# Patient Record
Sex: Male | Born: 1965 | Race: White | Hispanic: No | Marital: Married | Smoking: Former smoker
Health system: Southern US, Community
[De-identification: ages and names within clinical notes are randomized; demographics above are authoritative.]

## PROBLEM LIST (undated history)

## (undated) DIAGNOSIS — K219 Gastro-esophageal reflux disease without esophagitis: Secondary | ICD-10-CM

## (undated) DIAGNOSIS — F329 Major depressive disorder, single episode, unspecified: Secondary | ICD-10-CM

## (undated) DIAGNOSIS — G43909 Migraine, unspecified, not intractable, without status migrainosus: Secondary | ICD-10-CM

## (undated) DIAGNOSIS — I509 Heart failure, unspecified: Secondary | ICD-10-CM

## (undated) DIAGNOSIS — I219 Acute myocardial infarction, unspecified: Secondary | ICD-10-CM

## (undated) DIAGNOSIS — R0600 Dyspnea, unspecified: Secondary | ICD-10-CM

## (undated) DIAGNOSIS — Z95810 Presence of automatic (implantable) cardiac defibrillator: Secondary | ICD-10-CM

## (undated) DIAGNOSIS — Q2381 Bicuspid aortic valve: Secondary | ICD-10-CM

## (undated) DIAGNOSIS — F32A Depression, unspecified: Secondary | ICD-10-CM

## (undated) DIAGNOSIS — I255 Ischemic cardiomyopathy: Secondary | ICD-10-CM

## (undated) DIAGNOSIS — F419 Anxiety disorder, unspecified: Secondary | ICD-10-CM

## (undated) DIAGNOSIS — E785 Hyperlipidemia, unspecified: Secondary | ICD-10-CM

## (undated) DIAGNOSIS — I350 Nonrheumatic aortic (valve) stenosis: Secondary | ICD-10-CM

## (undated) DIAGNOSIS — Q230 Congenital stenosis of aortic valve: Secondary | ICD-10-CM

## (undated) HISTORY — DX: Anxiety disorder, unspecified: F41.9

## (undated) HISTORY — DX: Gastro-esophageal reflux disease without esophagitis: K21.9

## (undated) HISTORY — PX: WISDOM TOOTH EXTRACTION: SHX21

## (undated) HISTORY — DX: Ischemic cardiomyopathy: I25.5

## (undated) HISTORY — DX: Congenital stenosis of aortic valve: Q23.0

## (undated) HISTORY — DX: Bicuspid aortic valve: Q23.81

## (undated) HISTORY — DX: Nonrheumatic aortic (valve) stenosis: I35.0

## (undated) HISTORY — DX: Hyperlipidemia, unspecified: E78.5

## (undated) NOTE — *Deleted (*Deleted)
***In Progress*** Referring Physician: HF Cardiology: Dr. Shirlee Latch  HPI:  70 y.o.male smoker wasadmitted on 8/1/21for delayed presentation anterior STEMI. Had had >36 hr of chest pain prior to seeking medical attention. Pain progressed to more pleuritic like CP. Found to have anterior ST elevations w/ precordial Q waves on admit. Hs troponin >27,000. Urgent cardiac cath showed totally occluded mid LAD w/o collaterals and 40% mid RCA stenosis. It was suspected he had completed his infarct and residual pleuritic CP was post-MI pericarditis. No intervention was performed. 2D echo demonstrated moderately reduced LVEF, 45-50%, + apical thrombus and moderate AS. MV normal. RV systolic function mildly reduced.   He was placed on medical management for CAD and systolic HF. He was started on ASA, atorvastatin, Losartan and carvedilol. Warfarin started for apical thrombusw/ heparin bridge.   Post cath, there were initial concerns for developing cardiogenic shock but he remained stable and did not require pressor/inotropic support. Co-ox remained stable. Repeat echo showed further reduction of LVEF down to 30-35% but no post MI mechanical complications. RV mildly reduced. He did require IV furosemide for pulmonary edema and volume status and dyspnea improved w/ diuresis. He was continued on GDMT w/ losartan, spironolactone and carvedilol. BP too soft for Entresto. He was treated w/ colchicine for post MI pericarditis w/ improvement in pleuritic chest pain. Given anterior MI w/ EF <35%, he was fitted w/ a LifeVest prior to discharge w/ plans to get repeat echo in 1 month.  He had repeat echo in 9/21, showing EF 25-30%. He saw Dr. Graciela Husbands with plan for ICD 11/28/19. He is still wearing Lifevest.   He recently returned to HF Clinic for followup of CHF and CAD with Dr. Shirlee Latch on 11/17/19. He seemed to be doing generally well. Occasional sensation of fullness in his chest but no pain. Using furosemide prn, but  still takes 4-5 times/week. No lightheadedness. He reported walking for 30-45 minutes with no problems on flat ground. He reported getting short of breath walking up stairs or up a hill. No orthopnea/PND. Still smoking <1 ppd. He is an Chartered loss adjuster, working on a novel.     He returned to HF clinic on 12/05/19 for pharmacist medication titration. At last visit with MD, losartan was discontinued and Entresto 24/26 mg BID was initiated. Overall he was feeling well. Had occasional dizziness which was not bothersome and not worse since changing to [facility]. No chest pain or palpitations. Could walk for 40-60 min before getting SOB, notably more SOB going up stairs. Took furosemide PRN approximately 3-4 times per week. No LEE, PND or orthopnea. Was taking all medications as prescribed and tolerating all medications.   Today he returns to HF clinic for pharmacist medication titration. At last visit with MD, spironolactone was increased to 25 mg daily.  104/64, 86, 177.8 lbs [place] [employer] - Needs BMET Plan A: Farxiga Later: incr coreg but recently cardiogenic shock, incr Entresto but BP too low F/u DM on 1/3  Overall feeling ***. Dizziness, lightheadedness, fatigue:  Chest pain or palpitations:  How is your breathing?: *** SOB: Able to complete all ADLs. Activity level ***  Weight at home pounds. Takes furosemide/torsemide/bumex *** mg *** daily.  LEE PND/Orthopnea  Appetite *** Low-salt diet:   Physical Exam Cost/affordability of meds   HF Medications: Carvedilol 3.125 mg BID Entresto 24/26 mg BID Spironolactone 25 mg daily Digoxin 0.125 mg daily Furosemide 20 mg PRN  Has the patient been experiencing any side effects to the medications prescribed?  {YES ZO:10960}  Does the patient have any problems obtaining medications due to transportation or finances?   {YES NO:22349}  Understanding of regimen: {excellent/good/fair/poor:19665} Understanding of indications:  {excellent/good/fair/poor:19665} Potential of compliance: {excellent/good/fair/poor:19665} Patient understands to avoid NSAIDs. Patient understands to avoid decongestants.    Pertinent Lab Values: . Serum creatinine ***, BUN ***, Potassium ***, Sodium ***, BNP ***, Magnesium ***, Digoxin ***   Vital Signs: . Weight: *** (last clinic weight: 177.8 lbs) . Blood pressure: ***  . Heart rate: ***   Assessment: 1. ZOX:[id number]/11 for acute anterior MI, delayed presentation with no ischemic pain, had pleuritic-type chest pain (post-MI pericarditis). LHC with totally occluded mid LAD w/o collaterals. Suspected completed MI, no intervention. Plan medical management.No ischemic chest pain. - Continue ASA 81 mg daily. Not on clopidogrel because on dual warfarin/aspirin.  - Continue atorvastatin 80 mg daily -Refer to cardiac rehab.  2.Chronic systolic BJY:NWGNFAOZ Cardiomyopathy. Reduced EF post anterior MI. Echo in 9/21with EF 25-30%, peri-apical akinesis, moderate AS. Cath w/ occluded mLAD but no intervention as he had completed his infarct. - Currently NYHA class II, not volume overloaded on exam.  - Received [employer] ICD on 11/28/19 - Continue furosemide 20 mg PRN - Continue carvedilol 3.125 mg BID. - Continue Entresto 24/26 mg BID.   - Continuespironolactone 25 mg daily - Continue digoxin 0.125 mg daily.Last level 0.7 ng/mL on 11/17/19.   3. Post MI Pericarditis: Pleuritic CP resolved.  -ContinueColchicine 0.6 mg dailyx 3 months. Sent in prescription for brand Mitigare today as this is preferred on Medicaid's formulary.   4. LV thrombus: Large LV thrombus noted on echoin 8/21, resolved on 9/21 echo. -Continue warfarin.  5. H/o Tobacco Abuse:Still smoking, needs to quit.  6. HYQ:MVHQIONG atorvastatin    Plan: 1) Medication changes: Based on clinical presentation, vital signs and recent labs will *** 2) Labs: *** 3) Follow-up: ***   Karle Plumber, PharmD, BCPS, BCCP, CPP Heart Failure Clinic Pharmacist [phone]

---

## 2013-12-07 ENCOUNTER — Ambulatory Visit: Payer: Self-pay

## 2014-04-26 ENCOUNTER — Emergency Department (HOSPITAL_COMMUNITY)
Admission: EM | Admit: 2014-04-26 | Discharge: 2014-04-26 | Disposition: A | Discharge status: Other Acute Inpatient Hospital | Payer: Self-pay | Attending: Emergency Medicine | Admitting: Emergency Medicine

## 2014-04-26 ENCOUNTER — Encounter (HOSPITAL_COMMUNITY): Payer: Self-pay | Admitting: Emergency Medicine

## 2014-04-26 ENCOUNTER — Inpatient Hospital Stay (HOSPITAL_COMMUNITY)
Admission: AD | Admit: 2014-04-26 | Discharge: 2014-05-02 | DRG: 885 | Disposition: A | Discharge status: Home / Self Care | Payer: Federal, State, Local not specified - Other | Source: Intra-hospital | Attending: Psychiatry | Admitting: Psychiatry

## 2014-04-26 ENCOUNTER — Encounter (HOSPITAL_COMMUNITY): Payer: Self-pay | Admitting: *Deleted

## 2014-04-26 DIAGNOSIS — K219 Gastro-esophageal reflux disease without esophagitis: Secondary | ICD-10-CM | POA: Diagnosis present

## 2014-04-26 DIAGNOSIS — F10239 Alcohol dependence with withdrawal, unspecified: Secondary | ICD-10-CM | POA: Diagnosis present

## 2014-04-26 DIAGNOSIS — F10230 Alcohol dependence with withdrawal, uncomplicated: Secondary | ICD-10-CM | POA: Diagnosis present

## 2014-04-26 DIAGNOSIS — Z79899 Other long term (current) drug therapy: Secondary | ICD-10-CM | POA: Insufficient documentation

## 2014-04-26 DIAGNOSIS — F1010 Alcohol abuse, uncomplicated: Secondary | ICD-10-CM | POA: Diagnosis present

## 2014-04-26 DIAGNOSIS — R45851 Suicidal ideations: Secondary | ICD-10-CM

## 2014-04-26 DIAGNOSIS — F332 Major depressive disorder, recurrent severe without psychotic features: Secondary | ICD-10-CM | POA: Diagnosis present

## 2014-04-26 DIAGNOSIS — F411 Generalized anxiety disorder: Secondary | ICD-10-CM | POA: Diagnosis present

## 2014-04-26 DIAGNOSIS — F17210 Nicotine dependence, cigarettes, uncomplicated: Secondary | ICD-10-CM | POA: Diagnosis present

## 2014-04-26 DIAGNOSIS — G4700 Insomnia, unspecified: Secondary | ICD-10-CM | POA: Diagnosis present

## 2014-04-26 DIAGNOSIS — G43909 Migraine, unspecified, not intractable, without status migrainosus: Secondary | ICD-10-CM | POA: Insufficient documentation

## 2014-04-26 DIAGNOSIS — F1994 Other psychoactive substance use, unspecified with psychoactive substance-induced mood disorder: Secondary | ICD-10-CM | POA: Diagnosis present

## 2014-04-26 DIAGNOSIS — F322 Major depressive disorder, single episode, severe without psychotic features: Secondary | ICD-10-CM

## 2014-04-26 DIAGNOSIS — Z720 Tobacco use: Secondary | ICD-10-CM | POA: Insufficient documentation

## 2014-04-26 DIAGNOSIS — Q681 Congenital deformity of finger(s) and hand: Secondary | ICD-10-CM | POA: Insufficient documentation

## 2014-04-26 DIAGNOSIS — Y900 Blood alcohol level of less than 20 mg/100 ml: Secondary | ICD-10-CM | POA: Insufficient documentation

## 2014-04-26 HISTORY — DX: Gastro-esophageal reflux disease without esophagitis: K21.9

## 2014-04-26 HISTORY — DX: Migraine, unspecified, not intractable, without status migrainosus: G43.909

## 2014-04-26 HISTORY — DX: Depression, unspecified: F32.A

## 2014-04-26 HISTORY — DX: Major depressive disorder, single episode, unspecified: F32.9

## 2014-04-26 LAB — COMPREHENSIVE METABOLIC PANEL
ALK PHOS: 66 U/L (ref 39–117)
ALT: 23 U/L (ref 0–53)
AST: 23 U/L (ref 0–37)
Albumin: 4.7 g/dL (ref 3.5–5.2)
Anion gap: 11 (ref 5–15)
BUN: 12 mg/dL (ref 6–23)
CALCIUM: 9.3 mg/dL (ref 8.4–10.5)
CHLORIDE: 103 mmol/L (ref 96–112)
CO2: 23 mmol/L (ref 19–32)
CREATININE: 0.85 mg/dL (ref 0.50–1.35)
GFR calc Af Amer: >90 mL/min (ref >90)
GFR calc non Af Amer: >90 mL/min (ref >90)
Glucose, Bld: 103 mg/dL — ABNORMAL HIGH (ref 70–99)
POTASSIUM: 3.8 mmol/L (ref 3.5–5.1)
Sodium: 137 mmol/L (ref 135–145)
Total Bilirubin: 0.9 mg/dL (ref 0.3–1.2)
Total Protein: 7.7 g/dL (ref 6.0–8.3)

## 2014-04-26 LAB — CBC
HEMATOCRIT: 45.1 % (ref 39.0–52.0)
Hemoglobin: 15.6 g/dL (ref 13.0–17.0)
MCH: 31.2 pg (ref 26.0–34.0)
MCHC: 34.6 g/dL (ref 30.0–36.0)
MCV: 90.2 fL (ref 78.0–100.0)
Platelets: 307 10*3/uL (ref 150–400)
RBC: 5 MIL/uL (ref 4.22–5.81)
RDW: 12.7 % (ref 11.5–15.5)
WBC: 11.1 10*3/uL — ABNORMAL HIGH (ref 4.0–10.5)

## 2014-04-26 LAB — ETHANOL: Alcohol, Ethyl (B): <5 mg/dL (ref 0–9)

## 2014-04-26 LAB — SALICYLATE LEVEL: Salicylate Lvl: <4 mg/dL (ref 2.8–20.0)

## 2014-04-26 LAB — ACETAMINOPHEN LEVEL: Acetaminophen (Tylenol), Serum: <10 ug/mL — ABNORMAL LOW (ref 10–30)

## 2014-04-26 MED ORDER — CHLORDIAZEPOXIDE HCL 25 MG PO CAPS: 25.0000 mg | ORAL_CAPSULE | Freq: Four times a day (QID) | ORAL | Status: AC | PRN

## 2014-04-26 MED ORDER — OMEPRAZOLE MAGNESIUM 20 MG PO TBEC: 20.0000 mg | DELAYED_RELEASE_TABLET | Freq: Two times a day (BID) | ORAL | Status: DC

## 2014-04-26 MED ORDER — VITAMIN B-1 100 MG PO TABS
100.0000 mg | ORAL_TABLET | Freq: Every day | ORAL | Status: DC
  Administered 2014-04-27 – 2014-05-02 (×6): 100 mg via ORAL
  Filled 2014-04-26 (×9): qty 1

## 2014-04-26 MED ORDER — THIAMINE HCL 100 MG/ML IJ SOLN: 100.0000 mg | Freq: Once | INTRAMUSCULAR | Status: DC

## 2014-04-26 MED ORDER — MAGNESIUM HYDROXIDE 400 MG/5ML PO SUSP: 30.0000 mL | Freq: Every day | ORAL | Status: DC | PRN

## 2014-04-26 MED ORDER — ADULT MULTIVITAMIN W/MINERALS CH
1.0000 | ORAL_TABLET | Freq: Every day | ORAL | Status: DC
  Administered 2014-04-27 – 2014-05-02 (×6): 1 via ORAL
  Filled 2014-04-26 (×8): qty 1

## 2014-04-26 MED ORDER — ONDANSETRON 4 MG PO TBDP: 4.0000 mg | ORAL_TABLET | Freq: Four times a day (QID) | ORAL | Status: AC | PRN

## 2014-04-26 MED ORDER — HYDROXYZINE HCL 25 MG PO TABS
25.0000 mg | ORAL_TABLET | Freq: Four times a day (QID) | ORAL | Status: AC | PRN
  Administered 2014-04-28 – 2014-04-29 (×4): 25 mg via ORAL
  Filled 2014-04-26 (×4): qty 1

## 2014-04-26 MED ORDER — PANTOPRAZOLE SODIUM 40 MG PO TBEC
40.0000 mg | DELAYED_RELEASE_TABLET | Freq: Two times a day (BID) | ORAL | Status: DC
  Administered 2014-04-27 – 2014-05-02 (×11): 40 mg via ORAL
  Filled 2014-04-26 (×14): qty 1

## 2014-04-26 MED ORDER — ACETAMINOPHEN 325 MG PO TABS
650.0000 mg | ORAL_TABLET | Freq: Four times a day (QID) | ORAL | Status: DC | PRN
  Administered 2014-04-27 – 2014-05-01 (×3): 650 mg via ORAL
  Filled 2014-04-26 (×3): qty 2

## 2014-04-26 MED ORDER — LOPERAMIDE HCL 2 MG PO CAPS: 2.0000 mg | ORAL_CAPSULE | ORAL | Status: AC | PRN

## 2014-04-26 MED ORDER — ALUM & MAG HYDROXIDE-SIMETH 200-200-20 MG/5ML PO SUSP: 30.0000 mL | ORAL | Status: DC | PRN

## 2014-04-26 MED ORDER — TRAZODONE HCL 50 MG PO TABS
50.0000 mg | ORAL_TABLET | Freq: Every evening | ORAL | Status: DC | PRN
  Administered 2014-04-26: 50 mg via ORAL
  Filled 2014-04-26 (×8): qty 1
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
## 2014-04-26 NOTE — BH Assessment (Signed)
Writer informed the Charge Nurse Erline Levine) that the patient will be coming to [facility] ED for medical clearance.   Patient is a 49 year old white male that is a walk in at [facility].  Patient reports SI with a plan to jump off the ledge of his apt.  Patient reports that he cannot contract for safety.  Patient reports increased depression associated with not being able to safety.
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
## 2014-04-26 NOTE — Tx Team (Signed)
Initial Interdisciplinary Treatment Plan   PATIENT STRESSORS: Financial difficulties Legal issue Occupational concerns Substance abuse   PATIENT STRENGTHS: Ability for insight Average or above average intelligence Capable of independent living Communication skills Motivation for treatment/growth Supportive family/friends   PROBLEM LIST: Problem List/Patient Goals Date to be addressed Date deferred Reason deferred Estimated date of resolution  Depression "Get stabilized on medication" 04/26/14   At d/c  Suicidal ideation 04/26/14   At d/c  anxiety 04/26/14   At d/c  Substance abuse 04/26/14   At d/c                                 DISCHARGE CRITERIA:  Ability to meet basic life and health needs Improved stabilization in mood, thinking, and/or behavior Motivation to continue treatment in a less acute level of care Need for constant or close observation no longer present Reduction of life-threatening or endangering symptoms to within safe limits Verbal commitment to aftercare and medication compliance Withdrawal symptoms are absent or subacute and managed without 24-hour nursing intervention  PRELIMINARY DISCHARGE PLAN: Outpatient therapy Return to previous living arrangement  PATIENT/FAMIILY INVOLVEMENT: This treatment plan has been presented to and reviewed with the patient, Craig Flores.  The patient and family have been given the opportunity to ask questions and make suggestions.  [name] 04/26/2014, 10:13 PM

## 2014-04-26 NOTE — BH Assessment (Addendum)
Per Reginold Agent - patient meets criteria for inpatient hospitalization.  Per Otila Kluver [facility]) patient accepted to [facility] Bed 405-2.  TTS will have patient to sign voluntary admission paperwork.  Nurse will arrange transportation through [place].   Dr. Parke Poisson is the accepting doctor.  Writer informed the Camera operator Erline Levine).  Writer informed TTS Marlou Porch) that the patient needs to have the support paperwork completed.    The nurse will arrange transportation with [place].
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
## 2014-04-26 NOTE — BH Assessment (Signed)
Patient signed support paperwork. Patient awaiting transfer to [facility]
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
## 2014-04-27 DIAGNOSIS — F1010 Alcohol abuse, uncomplicated: Secondary | ICD-10-CM | POA: Diagnosis present

## 2014-04-27 DIAGNOSIS — F332 Major depressive disorder, recurrent severe without psychotic features: Secondary | ICD-10-CM | POA: Diagnosis present

## 2014-04-27 MED ORDER — SERTRALINE HCL 50 MG PO TABS
50.0000 mg | ORAL_TABLET | Freq: Every day | ORAL | Status: DC
  Administered 2014-04-27 – 2014-05-01 (×5): 50 mg via ORAL
  Filled 2014-04-27 (×8): qty 1

## 2014-04-27 MED ORDER — NICOTINE 21 MG/24HR TD PT24
21.0000 mg | MEDICATED_PATCH | Freq: Every day | TRANSDERMAL | Status: DC
  Administered 2014-04-27 – 2014-05-02 (×6): 21 mg via TRANSDERMAL
  Filled 2014-04-27 (×8): qty 1

## 2014-04-27 MED ORDER — MIRTAZAPINE 15 MG PO TABS
15.0000 mg | ORAL_TABLET | Freq: Every day | ORAL | Status: DC
  Administered 2014-04-27 – 2014-04-30 (×4): 15 mg via ORAL
  Filled 2014-04-27 (×6): qty 1

## 2014-04-27 MED ORDER — MIRTAZAPINE 15 MG PO TBDP
15.0000 mg | ORAL_TABLET | Freq: Every day | ORAL | Status: DC
  Filled 2014-04-27 (×3): qty 1
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
## 2014-04-27 NOTE — Progress Notes (Signed)
Patient ID: Craig Flores, male   DOB: [birth year], 49 y.o.   MRN: [id number] Dr Shary Key gave writer a verbal order for clients wife to be able to visit outside of visitation times due to the fact that she doesn't drive, and wants to come during the day so she isnt out and riding the bus at night. Wife is here now, and will visit after dinner, but tomorrow can come during day as long as it doesn't interfere with groups.
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
## 2014-04-27 NOTE — B[facility] Group Notes (Signed)
[facility] Group Notes:  (Nursing/MHT/Case Management/Adjunct)  Date:  04/27/2014  Time:  10:43 AM  Type of Therapy:  Nurse Education  Participation Level:  Active  Participation Quality:  Appropriate  Affect:  Appropriate  Cognitive:  Alert  Insight:  Improving  Engagement in Group:  Developing/Improving  Modes of Intervention:  Discussion, Education, Socialization and Support  Summary of Progress/Problems:  Davina Poke 04/27/2014, 10:43 AM

## 2014-04-27 NOTE — Progress Notes (Signed)
Patient ID: Craig Flores, male   DOB: 01/31/1965, 50 y.o.   MRN: [id number] D-Completed self inventory and gave himself a 9 out of 10 with 10 being the worst on how depressed and how hopeless he feels, and a 6 on his level of anxiety. He states his goal today is to get help with his anxiety. A-Support offered. Monitored for safety Medications as ordered. R-No complaints at this time. He attended groups this am. He would like an exception to be made so his wife can visit with him outside of usual visitation times since his wife doesn't drive. Asked Dr. Edythe Clarity write an order and will make a time for him to have her visit.
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
## 2014-04-27 NOTE — BHH Group Notes (Signed)
[place] LCSW Group Therapy  [facility] 1:15 - 2:30 PM  04/27/2014 3:26 PM   Type of Therapy:  Group Therapy  Participation Level: Minimal  Participation Quality: Appropriate  Affect:  Depressed, Flat  Cognitive:  Appropriate  Insight:  Developing/Improving   Engagement in Therapy:  Developing/Improving   Modes of Intervention:  Discussion, Education, Exploration, Problem-Solving, Rapport Building, Support   Summary of Progress/Problems:   Patient was attentive to speaker from the [facility] Association but made no comments on the presentation.  Concha Pyo 04/27/2014 3:26 PM

## 2014-04-28 ENCOUNTER — Encounter (HOSPITAL_COMMUNITY): Payer: Self-pay | Admitting: Registered Nurse

## 2014-04-28 NOTE — Progress Notes (Signed)
Pt reports he has had a good day.  He chose not to go to [employer], even with urging from staff.  He had a good visit with his wife this evening.  He reported some med changes made today and is hopeful for a good night sleep.  Pt says he attended most of the groups today.  He has been up and interacting well with peers.  He is pleasant and cooperative with staff.  Pt denies SI/HI/AV at this time.  Pt plans to return home at discharge.  Support and encouragement offered.  Pt makes his needs known to staff.  Safety maintained with q15 minute checks.

## 2014-04-28 NOTE — Progress Notes (Signed)
D:Pt reports passive si and describes it as less often or intense as before. Pt reports that he slept last night and felt like sleep is improving with medication. Pt rates depression as a 7 and anxiety as a 5 on 1-10 scale with 10 being the most. 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 Discussed coping skills for anxiety and depression. Offered encouragement and 15 minute checks. R:Pt contracts with staff for safety. Safety maintained on the unit.
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
## 2014-04-28 NOTE — BHH Group Notes (Signed)
[place] LCSW Group Therapy  Feelings Around Relapse 1:15 -2:30        04/28/2014 2:51 PM   Type of Therapy:  Group Therapy  Participation Level:  Appropriate  Participation Quality:  Appropriate  Affect:  Appropriate  Appropriate  Insight:  Developing/Improving  Engagement in Therapy: Developing/Improving  Modes of Intervention:  Discussion Exploration Problem-Solving Supportive  Summary of Progress/Problems:  The topic for today was feelings around relapse.    Patient processed feelings toward relapse and was able to relate to peers. Patient shared relapsing for would be using all of his time and energies to survive financially.  He stated if he took time for himself, there would be a price to pay.  Patient was able to understand he is paying a greater price by not taking care of himself and considering ending his life which can not be an option.     Craig Flores 04/28/2014 2:51 PM

## 2014-04-28 NOTE — Progress Notes (Signed)
[place] Group Notes:  (Nursing/MHT/Case Management/Adjunct)  Date:  04/28/2014  Time:  10:49 PM  Type of Therapy:  Psychoeducational Skills  Participation Level:  Minimal  Participation Quality:  Attentive  Affect:  Depressed  Cognitive:  Appropriate  Insight:  Appropriate  Engagement in Group:  Lacking  Modes of Intervention:  Education  Summary of Progress/Problems: The patient expressed in group that he had a better day since he experienced less anxiety today. In terms of the theme for the day, his coping skill will involve exercising.    Archie Balboa S 04/28/2014, 10:49 PM

## 2014-04-28 NOTE — Progress Notes (Signed)
Recreation Therapy Notes  Date: 04.01.2016 Time: 9:30am Location: [address] Group Room   Group Topic: Stress Management  Goal Area(s) Addresses:  Patient will actively participate in stress management techniques presented during session.   Behavioral Response: Did not attend.   Laureen Ochs Weslie Rasmus, LRT/CTRS  Kayli Beal L 04/28/2014 1:09 PM

## 2014-04-28 NOTE — Plan of Care (Signed)
Problem: Ineffective individual coping Goal: STG: Patient will remain free from self harm Outcome: Progressing Pt reports decrease in si thoughts

## 2014-04-28 NOTE — Progress Notes (Signed)
[facility] MD Progress Note  04/28/2014 2:10 PM Natale Thoma  MRN:  [id number]    Subjective: Here related to anxiety, worsening depression, suicidal thoughts; blackout related to alcohol use on Monday.   Patient states "I'm just real kind of tired in the head."  Patient states that suicidal thoughts are not as frequent "they're not coming at the constant rate that they were.  Rates depression 7/10 at this time, Anxiety 7/10.    Objective:  Patient seen, chart reviewed and discussed with treatment team.  Patient is participating in groups sessions and tolerating medications with out adverse reactions.    Principal Problem: Major depressive disorder, recurrent, severe without psychotic features Diagnosis:   Patient Active Problem List   Diagnosis Date Noted  . Major depressive disorder, recurrent, severe without psychotic features [F33.2]   . Alcohol abuse [F10.10]   . Alcohol dependence with withdrawal, uncomplicated [G64.403] 47/42/5956  . Generalized anxiety disorder [F41.1] 04/26/2014  . Suicidal ideations [R45.851] 04/26/2014  . Depression, major, single episode, severe [F32.2] 04/26/2014  . Substance induced mood disorder [F19.94] 04/26/2014   Total Time spent with patient: 30 minutes   Past Medical History:  Past Medical History  Diagnosis Date  . Depression   . Migraines   . Acid reflux    History reviewed. No pertinent past surgical history. Family History: History reviewed. No pertinent family history. Social History:  History  Alcohol Use  . Yes     History  Drug Use Not on file    History   Social History  . Marital Status: Married    Spouse Name: N/A  . Number of Children: N/A  . Years of Education: N/A   Social History Main Topics  . Smoking status: Current Every Day Smoker    Types: Cigarettes  . Smokeless tobacco: Not on file  . Alcohol Use: Yes  . Drug Use: Not on file  . Sexual Activity: Yes    Birth Control/ Protection: None   Other Topics  Concern  . None   Social History Narrative   Additional History:    Sleep: Fair  Appetite:  Good   Assessment:   Musculoskeletal: Strength & Muscle Tone: within normal limits Gait & Station: normal Patient leans: N/A   Psychiatric Specialty Exam: Physical Exam  Review of Systems  Psychiatric/Behavioral: Positive for depression and suicidal ideas. Negative for hallucinations. The patient is nervous/anxious and has insomnia.   All other systems reviewed and are negative.   Blood pressure 151/96, pulse 92, temperature 98.2 F (36.8 C), temperature source Oral, resp. rate 20, height '5\' 9"'  (1.753 m), weight 80.06 kg (176 lb 8 oz).Body mass index is 26.05 kg/(m^2).  General Appearance: Fairly Groomed  Engineer, water::  Good  Speech:  Normal Rate  Volume:  Normal  Mood:  Anxious and Depressed  Affect:  Congruent  Thought Process:  Goal Directed  Orientation:  Full (Time, Place, and Person)  Thought Content:  Denies hallucinations, delusions, or paranoia  Suicidal Thoughts:  Yes.  without intent/plan  Homicidal Thoughts:  No  Memory:  Immediate;   Good Recent;   Good Remote;   Good  Judgement:  Fair  Insight:  Fair  Psychomotor Activity:  Normal  Concentration:  Good  Recall:  Good  Fund of Knowledge:Good  Language: Good  Akathisia:  Negative  Handed:  Right  AIMS (if indicated):     Assets:  Communication Skills Desire for Improvement Resilience  ADL's:  Intact  Cognition: WNL  Sleep:  Number of Hours: 6.5     Current Medications: Current Facility-Administered Medications  Medication Dose Route Frequency Provider Last Rate Last Dose  . acetaminophen (TYLENOL) tablet 650 mg  650 mg Oral Q6H PRN Laverle Hobby, PA-C   650 mg at 04/27/14 1430  . alum & mag hydroxide-simeth (MAALOX/MYLANTA) 200-200-20 MG/5ML suspension 30 mL  30 mL Oral Q4H PRN Laverle Hobby, PA-C      . chlordiazePOXIDE (LIBRIUM) capsule 25 mg  25 mg Oral Q6H PRN Laverle Hobby, PA-C      .  hydrOXYzine (ATARAX/VISTARIL) tablet 25 mg  25 mg Oral Q6H PRN Laverle Hobby, PA-C      . loperamide (IMODIUM) capsule 2-4 mg  2-4 mg Oral PRN Laverle Hobby, PA-C      . magnesium hydroxide (MILK OF MAGNESIA) suspension 30 mL  30 mL Oral Daily PRN Laverle Hobby, PA-C      . mirtazapine (REMERON) tablet 15 mg  15 mg Oral QHS Jenne Campus, MD   15 mg at 04/27/14 2216  . multivitamin with minerals tablet 1 tablet  1 tablet Oral Daily Laverle Hobby, PA-C   1 tablet at 04/28/14 0831  . nicotine (NICODERM CQ - dosed in mg/24 hours) patch 21 mg  21 mg Transdermal Daily Jenne Campus, MD   21 mg at 04/28/14 0839  . ondansetron (ZOFRAN-ODT) disintegrating tablet 4 mg  4 mg Oral Q6H PRN Laverle Hobby, PA-C      . pantoprazole (PROTONIX) EC tablet 40 mg  40 mg Oral BID AC Jenne Campus, MD   40 mg at 04/28/14 2751  . sertraline (ZOLOFT) tablet 50 mg  50 mg Oral Daily Jenne Campus, MD   50 mg at 04/28/14 0831  . thiamine (B-1) injection 100 mg  100 mg Intramuscular Once Laverle Hobby, PA-C   100 mg at 04/26/14 2200  . thiamine (VITAMIN B-1) tablet 100 mg  100 mg Oral Daily Laverle Hobby, PA-C   100 mg at 04/28/14 7001    Lab Results:  Results for orders placed or performed during the hospital encounter of 04/26/14 (from the past 48 hour(s))  Acetaminophen level     Status: Abnormal   Collection Time: 04/26/14  6:00 PM  Result Value Ref Range   Acetaminophen (Tylenol), Serum <10.0 (L) 10 - 30 ug/mL    Comment:        THERAPEUTIC CONCENTRATIONS VARY SIGNIFICANTLY. A RANGE OF 10-30 ug/mL MAY BE AN EFFECTIVE CONCENTRATION FOR MANY PATIENTS. HOWEVER, SOME ARE BEST TREATED AT CONCENTRATIONS OUTSIDE THIS RANGE. ACETAMINOPHEN CONCENTRATIONS >150 ug/mL AT 4 HOURS AFTER INGESTION AND >50 ug/mL AT 12 HOURS AFTER INGESTION ARE OFTEN ASSOCIATED WITH TOXIC REACTIONS.   CBC     Status: Abnormal   Collection Time: 04/26/14  6:00 PM  Result Value Ref Range   WBC 11.1 (H) 4.0 -  10.5 K/uL   RBC 5.00 4.22 - 5.81 MIL/uL   Hemoglobin 15.6 13.0 - 17.0 g/dL   HCT 45.1 39.0 - 52.0 %   MCV 90.2 78.0 - 100.0 fL   MCH 31.2 26.0 - 34.0 pg   MCHC 34.6 30.0 - 36.0 g/dL   RDW 12.7 11.5 - 15.5 %   Platelets 307 150 - 400 K/uL  Comprehensive metabolic panel     Status: Abnormal   Collection Time: 04/26/14  6:00 PM  Result Value Ref Range   Sodium 137 135 - 145 mmol/L   Potassium  3.8 3.5 - 5.1 mmol/L   Chloride 103 96 - 112 mmol/L   CO2 23 19 - 32 mmol/L   Glucose, Bld 103 (H) 70 - 99 mg/dL   BUN 12 6 - 23 mg/dL   Creatinine, Ser 0.85 0.50 - 1.35 mg/dL   Calcium 9.3 8.4 - 10.5 mg/dL   Total Protein 7.7 6.0 - 8.3 g/dL   Albumin 4.7 3.5 - 5.2 g/dL   AST 23 0 - 37 U/L   ALT 23 0 - 53 U/L   Alkaline Phosphatase 66 39 - 117 U/L   Total Bilirubin 0.9 0.3 - 1.2 mg/dL   GFR calc non Af Amer >90 >90 mL/min   GFR calc Af Amer >90 >90 mL/min    Comment: (NOTE) The eGFR has been calculated using the CKD EPI equation. This calculation has not been validated in all clinical situations. eGFR's persistently <90 mL/min signify possible Chronic Kidney Disease.    Anion gap 11 5 - 15  Ethanol (ETOH)     Status: None   Collection Time: 04/26/14  6:00 PM  Result Value Ref Range   Alcohol, Ethyl (B) <5 0 - 9 mg/dL    Comment:        LOWEST DETECTABLE LIMIT FOR SERUM ALCOHOL IS 11 mg/dL FOR MEDICAL PURPOSES ONLY   Salicylate level     Status: None   Collection Time: 04/26/14  6:00 PM  Result Value Ref Range   Salicylate Lvl <7.5 2.8 - 20.0 mg/dL    Physical Findings: AIMS: Facial and Oral Movements Muscles of Facial Expression: None, normal Lips and Perioral Area: None, normal Jaw: None, normal Tongue: None, normal,Extremity Movements Upper (arms, wrists, hands, fingers): None, normal Lower (legs, knees, ankles, toes): None, normal, Trunk Movements Neck, shoulders, hips: None, normal, Overall Severity Severity of abnormal movements (highest score from questions above):  None, normal Incapacitation due to abnormal movements: None, normal Patient's awareness of abnormal movements (rate only patient's report): No Awareness, Dental Status Current problems with teeth and/or dentures?: No Does patient usually wear dentures?: No  CIWA:  CIWA-Ar Total: 0 COWS:     Treatment Plan Summary: Daily contact with patient to assess and evaluate symptoms and progress in treatment and Medication management   1. Admit for crisis management and stabilization 2. Medication management to reduce current symptoms to bale line and improve the patient's overall level of functioning 3. Treat health problems as indicated 4. Develop treatment plan to decrease risk of relapse upon discharge and the need for readmission. 5. Psycho-social education regarding relapse prevention and self care. 6. Health care follow up as needed for medical problems 7. Restart home medications where appropriate.    Will continue with current treatment plan; no changes at this time  Medical Decision Making:  Established Problem, Stable/Improving (1), Review of Psycho-Social Stressors (1), Review or order clinical lab tests (1), Review of Last Therapy Session (1) and Review of Medication Regimen & Side Effects (2)   Rankin, Shuvon, FNP-BC 04/28/2014, 2:10 PM   Agree with Progress Note as above

## 2014-04-28 NOTE — BHH Group Notes (Signed)
[facility] LCSW Aftercare Discharge Planning Group Note   04/28/2014  9:46 AM    Participation Quality:  Appropraite  Mood/Affect:  Depressed, Flat  Depression Rating:  7  Anxiety Rating:  5  Thoughts of Suicide:  Yes  Will you contract for safety?  Yes  Current AVH:  No  Plan for Discharge/Comments:  Patient attended discharge planning group and actively participated in group. Patient advised he was not being seen by a provider prior to admission.  Referral to be made to [facility] and [facility].  Suicide prevention education reviewed and SPE document provided.   Transportation Means: Patient has transportation.   Supports:  Patient has a limited support system.   Concha Pyo 04/28/2014   9:46 AM

## 2014-04-28 NOTE — Progress Notes (Signed)
D: Pt was already in bed during the assessment.  Stated he had no questions or concerns regarding his hospitalization at this time. Pt stated that his questions will come at discharge concerning follow up and how to "stay ok". Informed pt that SW, CM, RN and Dr would go over all discharge instructions. A:  Support and encouragement was offered. 15 min checks continued for safety.  R: Pt remains safe.

## 2014-04-28 NOTE — BHH Suicide Risk Assessment (Signed)
BHH INPATIENT:  Family/Significant Other Suicide Prevention Education  Suicide Prevention Education:  Education Completed; Melonie [place], Wife, [phone]; has been identified by the patient as the family member/significant other with whom the patient will be residing, and identified as the person(s) who will aid the patient in the event of a mental health crisis (suicidal ideations/suicide attempt).  With written consent from the patient, the family member/significant other has been provided the following suicide prevention education, prior to the and/or following the discharge of the patient.  The suicide prevention education provided includes the following:  Suicide risk factors  Suicide prevention and interventions  National Suicide Hotline telephone number  [facility] assessment telephone number  [facility] Emergency Assistance [place] and/or Residential Mobile Crisis Unit telephone number  Request made of family/significant other to:  Remove weapons (e.g., guns, rifles, knives), all items previously/currently identified as safety concern.   Wife advised patient does not have access to weapons.       Remove drugs/medications (over-the-counter, prescriptions, illicit drugs), all items previously/currently identified as a safety concern.  The family member/significant other verbalizes understanding of the suicide prevention education information provided.  The family member/significant other agrees to remove the items of safety concern listed above.  Concha Pyo 04/28/2014, 11:05 AM

## 2014-04-29 NOTE — [facility] Group Notes (Signed)
[place] Group Notes:  (Clinical Social Work)  04/29/2014     10-11AM  Summary of Progress/Problems:   The main focus of today's process group was to learn how to use a decisional balance exercise to analyze a self-identified current unhealthy coping skill and work toward making a decision as to whether to modify the current behavior.  Motivational Interviewing and a worksheet were utilized to help patients explore in depth the perceived benefits and costs of the self-sabotaging behavior, as well as the benefits and costs of replacing that with a healthy coping mechanism.   The patient expressed that his unhealthy coping skills include self-hate, self-blame, shutting down and then turning to alcohol.  He was very quiet throughout group, with flat and depressed affect.  He spoke little even when called on.  He did appear to be attentive but not engaged in group discussion.  Type of Therapy:  Group Therapy - Process   Participation Level:  Active and Attentive  Participation Quality:  Attentive  Affect:  Depressed and Flat  Cognitive:  Alert  Insight:  Improving  Engagement in Therapy:  Limited  Modes of Intervention:  Education, Motivational Interviewing  Selmer Dominion, LCSW 04/29/2014, 12:50 PM

## 2014-04-29 NOTE — Progress Notes (Signed)
D-  Patients presents with blunted affect and depressed and anxious mood, continues to have difficulty falling asleep awakens 2-3 x a night, .Pt minimizes his ETOH use, " Alcohol is really not my problem it's my anxiety that gets me in trouble." Discuss with pt ways to decrease his anxiety thru breathing and exercises. Goal for today is to prepare for discharged.  A- Support and Encouragement provided, Allowed patient to ventilate during 1:1. Pt visited with wife,stated visit went well.Continues to worry about where he'll work once he leaves here.   R- Will continue to monitor on q 15 minute checks for safety, compliant with medications and programming

## 2014-04-29 NOTE — Progress Notes (Signed)
Patient ID: Craig Flores, male   DOB: Oct 12, 1965, 48 y.o.   MRN: [id number] [facility] MD Progress Note  04/29/2014 5:37 PM Craig Flores  MRN:  [id number]   Subjective: Craig Flores says he doing much better. Says his medications seem to be improving. Sleep is good. Is endorsing occasional SI without plans or intent. Able to contract for safety. Denies any adverse effects from medications.   Principal Problem: Major depressive disorder, recurrent, severe without psychotic features Diagnosis:   Patient Active Problem List   Diagnosis Date Noted  . Major depressive disorder, recurrent, severe without psychotic features [F33.2]   . Alcohol abuse [F10.10]   . Alcohol dependence with withdrawal, uncomplicated [Y63.785] 88/50/2774  . Generalized anxiety disorder [F41.1] 04/26/2014  . Suicidal ideations [R45.851] 04/26/2014  . Depression, major, single episode, severe [F32.2] 04/26/2014  . Substance induced mood disorder [F19.94] 04/26/2014   Total Time spent with patient: 25 minutes  Past Medical History:  Past Medical History  Diagnosis Date  . Depression   . Migraines   . Acid reflux    History reviewed. No pertinent past surgical history. Family History: History reviewed. No pertinent family history.  Social History:  History  Alcohol Use  . Yes     History  Drug Use Not on file    History   Social History  . Marital Status: Married    Spouse Name: N/A  . Number of Children: N/A  . Years of Education: N/A   Social History Main Topics  . Smoking status: Current Every Day Smoker    Types: Cigarettes  . Smokeless tobacco: Not on file  . Alcohol Use: Yes  . Drug Use: Not on file  . Sexual Activity: Yes    Birth Control/ Protection: None   Other Topics Concern  . None   Social History Narrative   Additional History:    Sleep: Fair  Appetite:  Good  Assessment:  Patient seen, chart reviewed and discussed with treatment team.  Patient is participating in groups  sessions and tolerating medications with out adverse reactions.  Musculoskeletal: Strength & Muscle Tone: within normal limits Gait & Station: normal Patient leans: N/A  Psychiatric Specialty Exam: Physical Exam  ROS  Blood pressure 142/88, pulse 114, temperature 99.1 F (37.3 C), temperature source Oral, resp. rate 18, height 5\' 9"  (1.753 m), weight 80.06 kg (176 lb 8 oz).Body mass index is 26.05 kg/(m^2).  General Appearance: Fairly Groomed  Engineer, water::  Good  Speech:  Normal Rate  Volume:  Normal  Mood:  Anxious and Depressed  Affect:  Congruent  Thought Process:  Goal Directed  Orientation:  Full (Time, Place, and Person)  Thought Content:  Denies hallucinations, delusions, or paranoia  Suicidal Thoughts:  Yes.  without intent/plan  Homicidal Thoughts:  No  Memory:  Immediate;   Good Recent;   Good Remote;   Good  Judgement:  Fair  Insight:  Fair  Psychomotor Activity:  Normal  Concentration:  Good  Recall:  Good  Fund of Knowledge:Good  Language: Good  Akathisia:  Negative  Handed:  Right  AIMS (if indicated):     Assets:  Communication Skills Desire for Improvement Resilience  ADL's:  Intact  Cognition: WNL  Sleep:  Number of Hours: 6.75   Current Medications: Current Facility-Administered Medications  Medication Dose Route Frequency Provider Last Rate Last Dose  . acetaminophen (TYLENOL) tablet 650 mg  650 mg Oral Q6H PRN Laverle Hobby, PA-C   650 mg at 04/27/14  1430  . alum & mag hydroxide-simeth (MAALOX/MYLANTA) 200-200-20 MG/5ML suspension 30 mL  30 mL Oral Q4H PRN Laverle Hobby, PA-C      . chlordiazePOXIDE (LIBRIUM) capsule 25 mg  25 mg Oral Q6H PRN Laverle Hobby, PA-C      . hydrOXYzine (ATARAX/VISTARIL) tablet 25 mg  25 mg Oral Q6H PRN Laverle Hobby, PA-C   25 mg at 04/29/14 0817  . loperamide (IMODIUM) capsule 2-4 mg  2-4 mg Oral PRN Laverle Hobby, PA-C      . magnesium hydroxide (MILK OF MAGNESIA) suspension 30 mL  30 mL Oral Daily PRN  Laverle Hobby, PA-C      . mirtazapine (REMERON) tablet 15 mg  15 mg Oral QHS Jenne Campus, MD   15 mg at 04/28/14 2136  . multivitamin with minerals tablet 1 tablet  1 tablet Oral Daily Laverle Hobby, PA-C   1 tablet at 04/29/14 0817  . nicotine (NICODERM CQ - dosed in mg/24 hours) patch 21 mg  21 mg Transdermal Daily Jenne Campus, MD   21 mg at 04/29/14 0818  . ondansetron (ZOFRAN-ODT) disintegrating tablet 4 mg  4 mg Oral Q6H PRN Laverle Hobby, PA-C      . pantoprazole (PROTONIX) EC tablet 40 mg  40 mg Oral BID AC Jenne Campus, MD   40 mg at 04/29/14 1724  . sertraline (ZOLOFT) tablet 50 mg  50 mg Oral Daily Jenne Campus, MD   50 mg at 04/29/14 0817  . thiamine (B-1) injection 100 mg  100 mg Intramuscular Once Laverle Hobby, PA-C   100 mg at 04/26/14 2200  . thiamine (VITAMIN B-1) tablet 100 mg  100 mg Oral Daily Laverle Hobby, PA-C   100 mg at 04/29/14 2130   Lab Results:  No results found for this or any previous visit (from the past 48 hour(s)).  Physical Findings: AIMS: Facial and Oral Movements Muscles of Facial Expression: None, normal Lips and Perioral Area: None, normal Jaw: None, normal Tongue: None, normal,Extremity Movements Upper (arms, wrists, hands, fingers): None, normal Lower (legs, knees, ankles, toes): None, normal, Trunk Movements Neck, shoulders, hips: None, normal, Overall Severity Severity of abnormal movements (highest score from questions above): None, normal Incapacitation due to abnormal movements: None, normal Patient's awareness of abnormal movements (rate only patient's report): No Awareness, Dental Status Current problems with teeth and/or dentures?: No Does patient usually wear dentures?: No  CIWA:  CIWA-Ar Total: 0 COWS:     Treatment Plan Summary: Daily contact with patient to assess and evaluate symptoms and progress in treatment and Medication management ; continue crisis management and stabilization 1. Medication  management to reduce current symptoms to bale line and improve the patient's overall level of functioning; Mirtazapine 15 mg qhs for insomnia, Sertraline 50 mg for depression, Hydroxyzine 25 mg prn for anxiety, Librium 25 mg prn for detox. 2. Treat health problems as indicated 3. Develop treatment plan to decrease risk of relapse upon discharge and the need for readmission. 4. Psycho-social education regarding relapse prevention and self care. 5. Health care follow up as needed for medical problems 6. Restart home medications where appropriate.    Medical Decision Making:  Established Problem, Stable/Improving (1), Review of Psycho-Social Stressors (1), Review or order clinical lab tests (1), Review of Last Therapy Session (1) and Review of Medication Regimen & Side Effects (2)  Encarnacion Slates, PMHNP, FNP-BC 04/29/2014, 5:37 PM

## 2014-04-29 NOTE — Progress Notes (Signed)
[facility] Group Notes:  (Nursing/MHT/Case Management/Adjunct)  Date:  04/29/2014  Time:  11:22 PM  Type of Therapy:  Psychoeducational Skills  Participation Level:  Active  Participation Quality:  Appropriate  Affect:  Flat  Cognitive:  Appropriate  Insight:  Improving  Engagement in Group:  Improving  Modes of Intervention:  Education  Summary of Progress/Problems: The patient was encouraged to attend this evening's A.A. Meeting and he refused. Patient mentioned in group that he felt better today since his depression and anxiety were less prominent today. In addition, he indicated that his concentration improved as well. In terms of the theme for the day, he indicated that he is in the process of trying to line up his support system.   Craig Flores S 04/29/2014, 11:22 PM

## 2014-04-30 MED ORDER — HYDROXYZINE HCL 25 MG PO TABS
25.0000 mg | ORAL_TABLET | Freq: Four times a day (QID) | ORAL | Status: DC | PRN
  Administered 2014-05-01 (×2): 25 mg via ORAL
  Filled 2014-04-30 (×2): qty 1

## 2014-04-30 NOTE — Progress Notes (Signed)
Patient ID: Craig Flores, male   DOB: 1965/06/02, 49 y.o.   MRN: [id number] Adult Psychoeducational Group Note  Date:  04/30/2014 Time: 1:20pm  Group Topic/Focus:  Making Healthy Choices:   The focus of this group is to help patients identify negative/unhealthy choices they were using prior to admission and identify positive/healthier coping strategies to replace them upon discharge.  Participation Level:  Minimal  Participation Quality:  Appropriate  Affect:  Flat  Cognitive:  Appropriate  Insight: Lacking  Engagement in Group:  Limited  Modes of Intervention:  Activity, Clarification, Discussion, Education and Support  Additional Comments:  Pt able to identify one goal to accomplish today.   Elenore Rota 04/30/2014, 2:20 PM

## 2014-04-30 NOTE — Progress Notes (Signed)
Writer observed patient up in the dayroom working on a puzzle. He reports that his days get better each day and feels that he is able to tolerate his anxiety more without the use of medication. He denies having si on today along with -hi/a/v hallucinations. Support and encouragement offered, Safety maintained on unit with 15 min checks. Patient is compliant with scheduled medication.

## 2014-04-30 NOTE — Progress Notes (Signed)
Patient ID: Craig Flores, male   DOB: [birth year], 49 y.o.   MRN: [id number]  Pt currently presents with a flat affect and depressed behavior. Per self inventory, pt rates depression at a 7, hopelessness 6 and anxiety 4. Pt's daily goal is to "think about a plan for action when I go home, without getting anxious and overwhelmed." Pt reports good sleep, poor concentration ("but better") and a good appetite.  Pt provided with medications per providers orders. Pt's labs and vitals were monitored throughout the day. Pt supported emotionally and encouraged to express concerns and questions. Pt educated on medications.   Pt's safety ensured with 15 minute and environmental checks. Pt currently denies SI/HI and A/V hallucinations. Pt verbally agrees to seek staff if SI/HI or A/VH occurs and to consult with staff before acting on these thoughts.

## 2014-04-30 NOTE — Progress Notes (Signed)
Patient has been up in the dayroom watching tv with minimal interaction with peers.He reports that his day has been a little better than yesterday. He c/o neck pain and feeling anxious and received medications to help with symptoms. He reports passive si and verbally contracts, denies hi/a/v hallucinations. Support and encouragement offered, safety maintained on unit with 15 min checks.

## 2014-04-30 NOTE — Plan of Care (Signed)
Problem: Diagnosis: Increased Risk For Suicide Attempt Goal: STG-Patient Will Attend All Groups On The Unit Outcome: Progressing Patient attended group this evening and participated.

## 2014-04-30 NOTE — Progress Notes (Signed)
Patient ID: Craig Flores, male   DOB: 09/15/1965, 49 y.o.   MRN: [id number] [facility] MD Progress Note  04/30/2014 3:33 PM Craig Flores  MRN:  [id number]   Subjective: Craig Flores says he doing much better. Says his medications seem to be improving. Sleep is good. Is endorsing occasional SI without plans or intent. Able to contract for safety. Denies any adverse effects from medications. Denies suicidal thoughts today. Reports improvement in symptoms of depression. Is motivated to work on the relationship with his wife. Rates depression at seven and anxiety at four.   Principal Problem: Major depressive disorder, recurrent, severe without psychotic features Diagnosis:   Patient Active Problem List   Diagnosis Date Noted  . Major depressive disorder, recurrent, severe without psychotic features [F33.2]   . Alcohol abuse [F10.10]   . Alcohol dependence with withdrawal, uncomplicated [U76.546] 50/35/4656  . Generalized anxiety disorder [F41.1] 04/26/2014  . Suicidal ideations [R45.851] 04/26/2014  . Depression, major, single episode, severe [F32.2] 04/26/2014  . Substance induced mood disorder [F19.94] 04/26/2014   Total Time spent with patient: 25 minutes  Past Medical History:  Past Medical History  Diagnosis Date  . Depression   . Migraines   . Acid reflux    History reviewed. No pertinent past surgical history. Family History: History reviewed. No pertinent family history.  Social History:  History  Alcohol Use  . Yes     History  Drug Use Not on file    History   Social History  . Marital Status: Married    Spouse Name: N/A  . Number of Children: N/A  . Years of Education: N/A   Social History Main Topics  . Smoking status: Current Every Day Smoker    Types: Cigarettes  . Smokeless tobacco: Not on file  . Alcohol Use: Yes  . Drug Use: Not on file  . Sexual Activity: Yes    Birth Control/ Protection: None   Other Topics Concern  . None   Social History Narrative    Additional History:    Sleep: Fair  Appetite:  Good  Assessment:  Patient seen, chart reviewed and discussed with treatment team.  Patient is participating in groups sessions and tolerating medications with out adverse reactions.  Musculoskeletal: Strength & Muscle Tone: within normal limits Gait & Station: normal Patient leans: N/A  Psychiatric Specialty Exam: Physical Exam  ROS  Blood pressure 129/92, pulse 100, temperature 98.2 F (36.8 C), temperature source Oral, resp. rate 16, height 5\' 9"  (1.753 m), weight 80.06 kg (176 lb 8 oz).Body mass index is 26.05 kg/(m^2).  General Appearance: Fairly Groomed  Engineer, water::  Good  Speech:  Normal Rate  Volume:  Normal  Mood:  Anxious and Depressed  Affect:  Congruent  Thought Process:  Goal Directed  Orientation:  Full (Time, Place, and Person)  Thought Content:  Denies hallucinations, delusions, or paranoia  Suicidal Thoughts:  No  Homicidal Thoughts:  No  Memory:  Immediate;   Good Recent;   Good Remote;   Good  Judgement:  Fair  Insight:  Fair  Psychomotor Activity:  Normal  Concentration:  Good  Recall:  Good  Fund of Knowledge:Good  Language: Good  Akathisia:  Negative  Handed:  Right  AIMS (if indicated):     Assets:  Communication Skills Desire for Improvement Resilience  ADL's:  Intact  Cognition: WNL  Sleep:  Number of Hours: 6.75   Current Medications: Current Facility-Administered Medications  Medication Dose Route Frequency Provider Last Rate  Last Dose  . acetaminophen (TYLENOL) tablet 650 mg  650 mg Oral Q6H PRN Laverle Hobby, PA-C   650 mg at 04/29/14 1949  . alum & mag hydroxide-simeth (MAALOX/MYLANTA) 200-200-20 MG/5ML suspension 30 mL  30 mL Oral Q4H PRN Laverle Hobby, PA-C      . magnesium hydroxide (MILK OF MAGNESIA) suspension 30 mL  30 mL Oral Daily PRN Laverle Hobby, PA-C      . mirtazapine (REMERON) tablet 15 mg  15 mg Oral QHS Jenne Campus, MD   15 mg at 04/29/14 2216  .  multivitamin with minerals tablet 1 tablet  1 tablet Oral Daily Laverle Hobby, PA-C   1 tablet at 04/30/14 6767  . nicotine (NICODERM CQ - dosed in mg/24 hours) patch 21 mg  21 mg Transdermal Daily Jenne Campus, MD   21 mg at 04/30/14 0815  . pantoprazole (PROTONIX) EC tablet 40 mg  40 mg Oral BID AC Jenne Campus, MD   40 mg at 04/30/14 0645  . sertraline (ZOLOFT) tablet 50 mg  50 mg Oral Daily Jenne Campus, MD   50 mg at 04/30/14 0814  . thiamine (B-1) injection 100 mg  100 mg Intramuscular Once Laverle Hobby, PA-C   100 mg at 04/26/14 2200  . thiamine (VITAMIN B-1) tablet 100 mg  100 mg Oral Daily Laverle Hobby, PA-C   100 mg at 04/30/14 2094   Lab Results:  No results found for this or any previous visit (from the past 48 hour(s)).  Physical Findings: AIMS: Facial and Oral Movements Muscles of Facial Expression: None, normal Lips and Perioral Area: None, normal Jaw: None, normal Tongue: None, normal,Extremity Movements Upper (arms, wrists, hands, fingers): None, normal Lower (legs, knees, ankles, toes): None, normal, Trunk Movements Neck, shoulders, hips: None, normal, Overall Severity Severity of abnormal movements (highest score from questions above): None, normal Incapacitation due to abnormal movements: None, normal Patient's awareness of abnormal movements (rate only patient's report): No Awareness, Dental Status Current problems with teeth and/or dentures?: No Does patient usually wear dentures?: No  CIWA:  CIWA-Ar Total: 0 COWS:     Treatment Plan Summary: Daily contact with patient to assess and evaluate symptoms and progress in treatment and Medication management ; continue crisis management and stabilization 1. Medication management to reduce current symptoms to baseline and improve the patient's overall level of functioning; Mirtazapine 15 mg qhs for insomnia, Sertraline 50 mg for depression, Hydroxyzine 25 mg prn for anxiety,  2. Treat health problems as  indicated 3. Develop treatment plan to decrease risk of relapse upon discharge and the need for readmission. 4. Psycho-social education regarding relapse prevention and self care. 5. Health care follow up as needed for medical problems 6. Restart home medications where appropriate.    Medical Decision Making:  Established Problem, Stable/Improving (1), Review of Psycho-Social Stressors (1), Review or order clinical lab tests (1), Review of Last Therapy Session (1) and Review of Medication Regimen & Side Effects (2)  Jamayah Myszka, NP-C 04/30/2014, 3:33 PM

## 2014-04-30 NOTE — [facility] Group Notes (Signed)
[facility] Group Notes:  (Clinical Social Work)  04/30/2014  10:00-11:00AM  Summary of Progress/Problems:   The main focus of today's process group was to   1)  discuss the importance of adding supports  2)  define health supports versus unhealthy supports  3)  identify the patient's current unhealthy supports and plan how to handle them  4)  Identify the patient's current healthy supports and plan what to add.  An emphasis was placed on using counselor, doctor, therapy groups, 12-step groups, and problem-specific support groups to expand supports.    The patient expressed full comprehension of the concepts presented, and agreed that there is a need to add more supports.  The patient listened attentively but did not talk at all during group.  He displayed less anxiety today, had better eye contact.  Type of Therapy:  Process Group with Motivational Interviewing  Participation Level:  Active  Participation Quality:  Attentive  Affect:  Appropriate  Cognitive:  Alert and Appropriate  Insight:  Engaged  Engagement in Therapy:  Engaged  Modes of Intervention:   Education, Support and Processing, Activity  [employer], LCSW 04/30/2014, 12:15pm

## 2014-05-01 MED ORDER — SERTRALINE HCL 100 MG PO TABS
100.0000 mg | ORAL_TABLET | Freq: Every day | ORAL | Status: DC
  Administered 2014-05-02: 100 mg via ORAL
  Filled 2014-05-01 (×3): qty 1

## 2014-05-01 MED ORDER — QUETIAPINE FUMARATE 25 MG PO TABS
25.0000 mg | ORAL_TABLET | Freq: Three times a day (TID) | ORAL | Status: DC | PRN
Start: 2014-05-01 — End: 2014-05-02

## 2014-05-01 MED ORDER — MIRTAZAPINE 30 MG PO TABS
30.0000 mg | ORAL_TABLET | Freq: Every day | ORAL | Status: DC
  Administered 2014-05-01: 30 mg via ORAL
  Filled 2014-05-01 (×3): qty 1

## 2014-05-01 NOTE — Progress Notes (Signed)
  [facility] Adult Case Management Discharge Plan :  Will you be returning to the same living situation after discharge:  Yes,  Patient is returning home with wife. At discharge, do you have transportation home?: Yes,  Patient will arrange transporation home. Do you have the ability to pay for your medications: No.  Patient needs assistance with indigent medications   Release of information consent forms completed and in the chart;  Patient's signature needed at discharge.  Patient to Follow up at: Follow-up Information    Follow up with [facility] On 05/09/2014.   Why:  You are scheduled with Letitia Neri on Tuesday, May 09, 2014 at [facility] will get a call reminding you of the appointment   Contact information:   [address], [place]   [address]  [phone]      Follow up with [facility] On 05/03/2014.   Why:  Please go to [facility] walk in clinic on Wednesday, April 02, 2014 or any weekday between [place] for medication management/counseling   Contact information:   [address] [place], [place]   [address]  [phone]      Patient denies SI/HI: Patient no longer endorsing SI/HI or other thoughts of self harm.  Safety Planning and Suicide Prevention discussed: .Reviewed with all patients during discharge planning group   Have you used any form of tobacco in the last 30 days? (Cigarettes, Smokeless Tobacco, Cigars, and/or Pipes): Yes  Has patient been referred to the Quitline: Patient declined referral to Quitline.   Concha Pyo 05/01/2014, 10:46 AM

## 2014-05-01 NOTE — [facility] Group Notes (Signed)
[facility] LCSW Aftercare Discharge Planning Group Note   05/01/2014 9:55 AM    Participation Quality:  Appropraite  Mood/Affect:  Appropriate  Depression Rating:  6  Anxiety Rating:  4  Thoughts of Suicide:  No  Will you contract for safety?   NA  Current AVH:  No  Plan for Discharge/Comments:  Patient attended discharge planning group and actively participated in group. He reports feeling better and hopes to discharge home today.  He will follow up with [facility] and [facility].  He advised his baseline for depression an d anxiety is three.  Suicide prevention education reviewed and SPE document provided.   Transportation Means: Patient has transportation.   Supports:  Patient has a support system.   Lucee Brissett, [name]

## 2014-05-01 NOTE — Progress Notes (Signed)
Adult Psychoeducational Group Note  Date:  05/01/2014 Time:  10:14 PM  Group Topic/Focus:  Wrap-Up Group:   The focus of this group is to help patients review their daily goal of treatment and discuss progress on daily workbooks.  Participation Level:  Minimal  Participation Quality:  Appropriate  Affect:  Appropriate  Cognitive:  Appropriate  Insight: Appropriate  Engagement in Group:  Engaged  Modes of Intervention:  Discussion  Additional Comments:  Pts discussed different ways to maintain wellness, physically, emotionally, mentally, financially, and spiritual. Pts also discussed goals that they want to work on while in the hospital or when they leave the hospital. Pt stated he enjoys outdoor activities such as fishing and hunting as hobbies. Pt stated he wants to work on facing situations without becoming overwhelmed and taking things one day at a time.  Dahlia Bailiff C 05/01/2014, 10:14 PM

## 2014-05-01 NOTE — [facility] Group Notes (Signed)
[employer] LCSW Group Therapy          Overcoming Obstacles       1:15 -2:30        05/01/2014   3:38 PM     Type of Therapy:  Group Therapy  Participation Level:  Appropriate  Participation Quality:  Appropriate  Affect:  Approriate  Cognitive:  Appropriate  Insight: Developing/Improving  Engagement in Therapy: Developing/Imprvoing   Modes of Intervention:  Discussion Exploration  Education Rapport BuildingProblem-Solving Support  Summary of Progress/Problems:  The main focus of today's group was overcoming obstacles. Patient shared that although he knows the definition of obstacle, he is uncertain what the obstacle is he needs to overcome.  He talked about having periods of doing well but not able to keep things together.  Patient able to identify appropriate coping skills including work with a therapist to identify his specific obstacle.Concha Pyo 05/01/2014   3:38 PM

## 2014-05-01 NOTE — Progress Notes (Signed)
[facility] Group Notes:  (Nursing/MHT/Case Management/Adjunct)  Date:  05/01/2014  Time:  12:12 AM  Type of Therapy:  Psychoeducational Skills  Participation Level:  Minimal  Participation Quality:  Attentive  Affect:  Blunted  Cognitive:  Lacking  Insight:  Lacking  Engagement in Group:  Developing/Improving  Modes of Intervention:  Education  Summary of Progress/Problems: The patient offered nearly the identical responses in group as he did during the two previous evenings, ie. Patient feels better, anxiety and depression continue to decrease. In terms of the patient's goal for tomorrow, he would like to talk to his physician about his follow up plans.   Kaileen Bronkema S 05/01/2014, 12:12 AM

## 2014-05-01 NOTE — Plan of Care (Signed)
Problem: Alteration in mood & ability to function due to Goal: STG-Patient will comply with prescribed medication regimen (Patient will comply with prescribed medication regimen)  Outcome: Completed/Met Date Met:  05/01/14 Pt follows set medication regimen set up by providers at [facility]

## 2014-05-01 NOTE — Progress Notes (Signed)
  D: Pt still carries a flat and sad affect. Informed the writer that he's "still depressed, but that the anxiety is less." Stated, "at least the anxiety doesn't have me jumping out of my skin".  Pt stated that after discharge he plans to have [facility] for medication mgmt and also attend a work shop at the [place]. Pt voiced no questions or concerns.  A:  Support and encouragement was offered. 15 min checks continued for safety.  R: Pt remains safe.

## 2014-05-01 NOTE — Progress Notes (Signed)
Recreation Therapy Notes  Date: 04.04.2016 Time: 9:30am Location: [address] Dayroom   Group Topic: Stress Management  Goal Area(s) Addresses:  Patient will actively participate in stress management techniques presented during session.   Behavioral Response: Engaged, Appropriate   Intervention: Stress management techniques   Activity :  Deep Breathing and Progressive Muscle Relaxation. Patients were provided education and instruction for completing both deep breathing and progressive muscle relaxation.   Education:  Stress Management, Discharge Planning.   Education Outcome: Acknowledges education  Clinical Observations/Feedback: Patient actively participated in both techniques, displayed ability to practice independently post d/c and expressed no concerns.    Laureen Ochs Rogue Rafalski, LRT/CTRS  Katai Marsico L 05/01/2014 3:22 PM

## 2014-05-01 NOTE — Progress Notes (Signed)
Patient ID: Craig Flores, male   DOB: 06-09-1965, 49 y.o.   MRN: [id number]  Pt currently presents with a sad affect and cooperative behavior. Per self inventory, pt rates depression at a 6, hopelessness 6 and anxiety 4. Pt's daily goal is to "finalyzing discharge plans" and they intend to do so by "talk to the social worker and doctor." Pt reports fair sleep, good concentration and a good appetite. Pt also states "I would like to try to be discharged today."  Pt provided with medications per providers orders. Pt's labs and vitals were monitored throughout the day. Pt supported emotionally and encouraged to express concerns and questions. Pt educated on medications. Pt seen by MD today.  Pt's safety ensured with 15 minute and environmental checks. Pt currently denies SI/HI and A/V hallucinations. Pt verbally agrees to seek staff if SI/HI or A/VH occurs and to consult with staff before acting on these thoughts. Pt reports that "financial issues might stop" him from following his discharge plan. Wife visited pt today and pt smiles and says "it's been a good day."

## 2014-05-01 NOTE — Progress Notes (Addendum)
Patient ID: Craig Flores, male   DOB: 1965/09/29, 49 y.o.   MRN: [id number] [facility] MD Progress Note  05/01/2014 6:21 PM Dennie Moltz  MRN:  [id number]   Subjective: At this time patient is reporting partial but significant improvement, although still has some insomnia and still has episodes of increased anxiety. Denies medication side effects, and does feel medications are helping, albeit partially.  He is hoping for discharge soon. He states " I do know I have to get another job that is less stressful, because the stress of this job was making my anxiety worse."  Objective : I have discussed case with treatment team and have met with patient. At this time patient is partially improved compared to admission. He does state he is feeling better. He stresses anxiety as major symptom, and feels current medications are helping partially. Denies side effects. No disruptive behaviors on unit, going to groups, pleasant upon approach. At his request I met with him and his wife, who was visiting during visiting hours- she states he is much better and states she feels he is ready for discharge. She states " his main problem is anxiety". She states he  Generally drinks only when very anxious.   Principal Problem: Major depressive disorder, recurrent, severe without psychotic features Diagnosis:   Patient Active Problem List   Diagnosis Date Noted  . Major depressive disorder, recurrent, severe without psychotic features [F33.2]   . Alcohol abuse [F10.10]   . Alcohol dependence with withdrawal, uncomplicated [L27.517] 00/17/4944  . Generalized anxiety disorder [F41.1] 04/26/2014  . Suicidal ideations [R45.851] 04/26/2014  . Depression, major, single episode, severe [F32.2] 04/26/2014  . Substance induced mood disorder [F19.94] 04/26/2014   Total Time spent with patient: 25 minutes  Past Medical History:  Past Medical History  Diagnosis Date  . Depression   . Migraines   . Acid reflux     History reviewed. No pertinent past surgical history. Family History: History reviewed. No pertinent family history.  Social History:  History  Alcohol Use  . Yes     History  Drug Use Not on file    History   Social History  . Marital Status: Married    Spouse Name: N/A  . Number of Children: N/A  . Years of Education: N/A   Social History Main Topics  . Smoking status: Current Every Day Smoker    Types: Cigarettes  . Smokeless tobacco: Not on file  . Alcohol Use: Yes  . Drug Use: Not on file  . Sexual Activity: Yes    Birth Control/ Protection: None   Other Topics Concern  . None   Social History Narrative   Additional History:    Sleep: Fair  Appetite:  Good  Assessment:  Patient seen, chart reviewed and discussed with treatment team.  Patient is participating in groups sessions and tolerating medications with out adverse reactions.  Musculoskeletal: Strength & Muscle Tone: within normal limits Gait & Station: normal Patient leans: N/A  Psychiatric Specialty Exam: Physical Exam  ROS  Blood pressure 129/92, pulse 100, temperature 98.2 F (36.8 C), temperature source Oral, resp. rate 16, height '5\' 9"'  (1.753 m), weight 176 lb 8 oz (80.06 kg).Body mass index is 26.05 kg/(m^2).  General Appearance: improved grooming   Eye Contact::  Good  Speech:  Normal Rate  Volume:  Normal  Mood:  less depressed, still feeling anxious, affect more reactive   Affect:  Congruent  Thought Process:  Goal Directed  Orientation:  Full (Time, Place, and Person)  Thought Content:  Denies hallucinations, delusions, or paranoia  Suicidal Thoughts:  No- denies any thoughts of hurting self or anyone else   Homicidal Thoughts:  No  Memory:  Immediate;   Good Recent;   Good Remote;   Good  Judgement:  Fair  Insight:  Fair  Psychomotor Activity:  Normal  Concentration:  Good  Recall:  Good  Fund of Knowledge:Good  Language: Good  Akathisia:  Negative  Handed:  Right  AIMS  (if indicated):     Assets:  Communication Skills Desire for Improvement Resilience  ADL's:  Intact  Cognition: WNL  Sleep:  Number of Hours: 4.75   Current Medications: Current Facility-Administered Medications  Medication Dose Route Frequency Provider Last Rate Last Dose  . acetaminophen (TYLENOL) tablet 650 mg  650 mg Oral Q6H PRN Laverle Hobby, PA-C   650 mg at 05/01/14 0200  . alum & mag hydroxide-simeth (MAALOX/MYLANTA) 200-200-20 MG/5ML suspension 30 mL  30 mL Oral Q4H PRN Laverle Hobby, PA-C      . hydrOXYzine (ATARAX/VISTARIL) tablet 25 mg  25 mg Oral Q6H PRN Niel Hummer, NP   25 mg at 05/01/14 1538  . magnesium hydroxide (MILK OF MAGNESIA) suspension 30 mL  30 mL Oral Daily PRN Laverle Hobby, PA-C      . mirtazapine (REMERON) tablet 30 mg  30 mg Oral QHS Myer Peer Cobos, MD      . multivitamin with minerals tablet 1 tablet  1 tablet Oral Daily Laverle Hobby, PA-C   1 tablet at 05/01/14 0813  . nicotine (NICODERM CQ - dosed in mg/24 hours) patch 21 mg  21 mg Transdermal Daily Jenne Campus, MD   21 mg at 05/01/14 0813  . pantoprazole (PROTONIX) EC tablet 40 mg  40 mg Oral BID AC Jenne Campus, MD   40 mg at 05/01/14 1718  . QUEtiapine (SEROQUEL) tablet 25 mg  25 mg Oral Q8H PRN Jenne Campus, MD      . Derrill Memo ON 05/02/2014] sertraline (ZOLOFT) tablet 100 mg  100 mg Oral Daily Myer Peer Cobos, MD      . thiamine (B-1) injection 100 mg  100 mg Intramuscular Once Laverle Hobby, PA-C   100 mg at 04/26/14 2200  . thiamine (VITAMIN B-1) tablet 100 mg  100 mg Oral Daily Laverle Hobby, PA-C   100 mg at 05/01/14 8088   Lab Results:  No results found for this or any previous visit (from the past 54 hour(s)).  Physical Findings: AIMS: Facial and Oral Movements Muscles of Facial Expression: None, normal Lips and Perioral Area: None, normal Jaw: None, normal Tongue: None, normal,Extremity Movements Upper (arms, wrists, hands, fingers): None, normal Lower (legs,  knees, ankles, toes): None, normal, Trunk Movements Neck, shoulders, hips: None, normal, Overall Severity Severity of abnormal movements (highest score from questions above): None, normal Incapacitation due to abnormal movements: None, normal Patient's awareness of abnormal movements (rate only patient's report): No Awareness, Dental Status Current problems with teeth and/or dentures?: No Does patient usually wear dentures?: No  CIWA:  CIWA-Ar Total: 0 COWS:      Assessment- at this time reports improvement, less depression, and less anxiety, although still feels anxious, and feels incipient panic symptoms at times. Tolerating medications well. Future oriented .  Treatment Plan Summary: Daily contact with patient to assess and evaluate symptoms and progress in treatment and Medication management ; continue crisis management and stabilization  Increase Zoloft to 100 mgrs QDAY  Increase Remeron to 30 mgrs QHS D/C vistaril PRNS- as he states they do not help his anxiety symptoms significantly  Seroquel 25 mgr Q 8 hours PRN severe anxiety. We reviewed rationale and side effect profile.  Medical Decision Making:  Established Problem, Stable/Improving (1), Review of Psycho-Social Stressors (1), Review or order clinical lab tests (1), Review of Last Therapy Session (1), Review of Medication Regimen & Side Effects (2) and Review of New Medication or Change in Dosage (2)  COBOS, FERNANDO 05/01/2014, 6:21 PM

## 2014-05-02 MED ORDER — SERTRALINE HCL 100 MG PO TABS: 100.0000 mg | ORAL_TABLET | Freq: Every day | ORAL | Status: DC

## 2014-05-02 MED ORDER — OMEPRAZOLE MAGNESIUM 20 MG PO TBEC: 20.0000 mg | DELAYED_RELEASE_TABLET | Freq: Two times a day (BID) | ORAL | Status: DC

## 2014-05-02 MED ORDER — ADULT MULTIVITAMIN W/MINERALS CH: 1.0000 | ORAL_TABLET | Freq: Every day | ORAL | Status: AC

## 2014-05-02 MED ORDER — MIRTAZAPINE 30 MG PO TABS: 30.0000 mg | ORAL_TABLET | Freq: Every day | ORAL | Status: DC

## 2014-05-02 NOTE — Progress Notes (Signed)
Pt attended spiritual care group on grief and loss facilitated by chaplain Jerene Pitch and counseling intern [place] Meshawn Oconnor. Group opened with brief discussion and psycho-social ed around grief and loss in relationships and in relation to self - identifying life patterns, circumstances, changes that cause losses. Established group norm of speaking from own life experience. Group goal of establishing open and affirming space for members to share loss and experience with grief, normalize grief experience and provide psycho social education and grief support.  Group drew on narrative and Alderian therapeutic modalities.   Craig Flores was present throughout group but did not contribute to group discussion. Did appear to be attentive, as evidenced by eye contact and nodding while other group members shared.  [place] Tamberlyn Midgley Counseling Intern

## 2014-05-02 NOTE — B[facility] Suicide Risk Assessment (Signed)
[facility] Discharge Suicide Risk Assessment   Demographic Factors:  49 year old married male, employed, one 10 year old son  Total Time spent with patient: 45 minutes  Musculoskeletal: Strength & Muscle Tone: within normal limits Gait & Station: normal Patient leans: Backward  Psychiatric Specialty Exam: Physical Exam  ROS  Blood pressure 129/97, pulse 107, temperature 97.9 F (36.6 C), temperature source Oral, resp. rate 18, height 5\' 9"  (1.753 m), weight 176 lb 8 oz (80.06 kg).Body mass index is 26.05 kg/(m^2).  General Appearance: improved grooming  Eye Contact::  Good  Speech:  Normal Rate409  Volume:  Normal  Mood:  states he feels much better, and at this time not as depressed, affect is more reactive   Affect:  Appropriate and fuller in range   Thought Process:  Goal Directed and Linear  Orientation:  Full (Time, Place, and Person)  Thought Content:  no hallucinations,no delusions, not internally preoccupied   Suicidal Thoughts:  No denies any thoughts of hurting self or anyone else   Homicidal Thoughts:  No  Memory:  Recent and Remote grossly intact  Judgement:  Other:  improved   Insight:  Good  Psychomotor Activity:  Normal  Concentration:  Good  Recall:  Good  Fund of Knowledge:Good  Language: Good  Akathisia:  Negative  Handed:  Right  AIMS (if indicated):     Assets:  Communication Skills Desire for Improvement Housing Resilience Social Support Vocational/Educational  Sleep:  Number of Hours: 6.75  Cognition: WNL  ADL's:  Improved    Have you used any form of tobacco in the last 30 days? (Cigarettes, Smokeless Tobacco, Cigars, and/or Pipes): Yes  Has this patient used any form of tobacco in the last 30 days? (Cigarettes, Smokeless Tobacco, Cigars, and/or Pipes) Yes, A prescription for an FDA-approved tobacco cessation medication was offered at discharge and the patient refused  Mental Status Per Nursing Assessment::   On Admission:  Self-harm  thoughts  Current Mental Status by Physician: At this time patient is partially but significantly improved compared to admission- he is less depressed, although does not feel back to baseline, he does state he feels much better, his affect is more reactive, and he is not suicidal or homicidal or psychotic and is feeling more optimistic about future . No medication side effects.  Loss Factors: Stressful job, some financial difficulties   Historical Factors: History of depression, history of prior psychiatric admissions for depression  Risk Reduction Factors:   Responsible for children under 57 years of age, Sense of responsibility to family and Living with another person, especially a relative  Continued Clinical Symptoms:  Partial but significant improvement compared to admission.  Cognitive Features That Contribute To Risk:  No gross cognitive deficits noted upon discharge. Is alert , attentive, and oriented x 3   Suicide Risk:  Mild:  Suicidal ideation of limited frequency, intensity, duration, and specificity.  There are no identifiable plans, no associated intent, mild dysphoria and related symptoms, good self-control (both objective and subjective assessment), few other risk factors, and identifiable protective factors, including available and accessible social support.  Principal Problem: Major depressive disorder, recurrent, severe without psychotic features Discharge Diagnoses:  Patient Active Problem List   Diagnosis Date Noted  . Major depressive disorder, recurrent, severe without psychotic features [F33.2]   . Alcohol abuse [F10.10]   . Alcohol dependence with withdrawal, uncomplicated [I29.798] 92/11/9415  . Generalized anxiety disorder [F41.1] 04/26/2014  . Suicidal ideations [R45.851] 04/26/2014  . Depression, major,  single episode, severe [F32.2] 04/26/2014  . Substance induced mood disorder [F19.94] 04/26/2014    Follow-up Information    Follow up with [facility] On 05/09/2014.   Why:  You are scheduled with Letitia Neri on Tuesday, May 09, 2014 at [facility] will get a call reminding you of the appointment   Contact information:   [address], [place]   [address]  [phone]      Follow up with [facility] On 05/03/2014.   Why:  Please go to [facility] walk in clinic on Wednesday, April 02, 2014 or any weekday between [place] for medication management/counseling   Contact information:   [address] [place], [place]   [address]  [phone]      Plan Of Care/Follow-up recommendations:  Activity:  as tolerated  Diet:  Regular Tests:  NA Other:  See below  Is patient on multiple antipsychotic therapies at discharge:  No   Has Patient had three or more failed trials of antipsychotic monotherapy by history:  No  Recommended Plan for Multiple Antipsychotic Therapies: NA   Patient states he is going home. Plans to follow up as above .     Meagan Spease 05/02/2014, 11:21 AM

## 2014-05-02 NOTE — Progress Notes (Signed)
  [facility] Adult Case Management Discharge Plan :  Will you be returning to the same living situation after discharge:  Yes,  Patient returning home with wife. At discharge, do you have transportation home?: Yes,  Patient will arrange transportation home. Do you have the ability to pay for your medications: No, patient will be assisted with indigent medications.   Release of information consent forms completed and in the chart;  Patient's signature needed at discharge.  Patient to Follow up at: Follow-up Information    Follow up with [facility] On 05/09/2014.   Why:  You are scheduled with Letitia Neri on Tuesday, May 09, 2014 at [facility] will get a call reminding you of the appointment   Contact information:   [address], [place]   [address]  [phone]      Follow up with [facility] On 05/03/2014.   Why:  Please go to [facility] walk in clinic on Wednesday, April 02, 2014 or any weekday between [place] for medication management/counseling   Contact information:   [address] [place], [place]   [address]  [phone]      Patient denies SI/HI:  Patient no longer endorsing SI/HI or other thoughts of self harm.   Safety Planning and Suicide Prevention discussed: .Reviewed with all patients during discharge planning group   Have you used any form of tobacco in the last 30 days? (Cigarettes, Smokeless Tobacco, Cigars, and/or Pipes): Yes  Has patient been referred to the Quitline?:Patient declined referral to Quitline.   Concha Pyo 05/02/2014, 10:22 AM

## 2014-05-02 NOTE — Progress Notes (Signed)
Patient discharged per physician order; patient denies SI/HI and A/V hallucinations; patient received samples prescriptions, and copy of AVS after it was reviewed; patient had no other questions or concerns; patient verbalized and signed that he received all belongings; patient left the unit ambulatory

## 2014-05-02 NOTE — Tx Team (Signed)
Interdisciplinary Treatment Plan Update   Date Reviewed:  05/02/2014  Time Reviewed:  8:47 AM  Progress in Treatment:   Attending groups: Yes, patient is attending groups. Participating in groups: Yes, engages in group discussion. Taking medication as prescribed: Yes  Tolerating medication: Yes Family/Significant other contact made:  Yes, collateral contact with wife. Patient understands diagnosis: Yes, patient understands diagnosis and need for treatment. Discussing patient identified problems/goals with staff: Yes, patient is able to express goals for treatment and discharge. Medical problems stabilized or resolved: Yes Denies suicidal/homicidal ideation: No, but contracts for safety. Patient has not harmed self or others: Yes  For review of initial/current patient goals, please see plan of care.  Estimated Length of Stay:  Discharge today  Reasons for Continued Hospitalization:   New Problems/Goals identified:    Discharge Plan or Barriers:   Home with outpatient follow up [facility]  Additional Comments:    Patient and CSW reviewed patient's identified goals and treatment plan.  Patient verbalized understanding and agreed to treatment plan.   Attendees:  Patient:  05/02/2014 8:47 AM   Signature:  Gabriel Earing, MD 05/02/2014 8:47 AM  Signature:   Carlton Adam, MD 05/02/2014 8:47 AM  Signature:  Darrol Angel, RN 05/02/2014 8:47 AM  Signature: 05/02/2014 8:47 AM  Signature:   05/02/2014 8:47 AM  Signature:  Joette Catching, LCSW 05/02/2014 8:47 AM  Signature:  Erasmo Downer Drinkard, LCSW-A 05/02/2014 8:47 AM  Signature:   05/02/2014 8:47 AM  Signature:   05/02/2014 8:47 AM  Signature: Marilynne Halsted, RN 05/02/2014  8:47 AM  Signature:   Lars Pinks, RN [facility] 05/02/2014  8:47 AM  Signature:   05/02/2014  8:47 AM    Scribe for Treatment Team:   Joette Catching,  05/02/2014 8:47 AM
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
## 2014-05-05 NOTE — Progress Notes (Signed)
Patient Discharge Instructions:  After Visit Summary (AVS):   Faxed to:  05/05/14 Discharge Summary Note:   Faxed to:  05/05/14 Psychiatric Admission Assessment Note:   Faxed to:  05/05/14 Suicide Risk Assessment - Discharge Assessment:   Faxed to:  05/05/14 Faxed/Sent to the Next Level Care provider:  05/05/14 Faxed to [facility] @ [phone] Faxed to [facility] @ [place], 05/05/2014, 2:57 PM

## 2019-01-05 ENCOUNTER — Other Ambulatory Visit: Payer: Self-pay

## 2019-01-05 DIAGNOSIS — Z20822 Contact with and (suspected) exposure to covid-19: Secondary | ICD-10-CM

## 2019-01-07 LAB — NOVEL CORONAVIRUS, NAA: SARS-CoV-2, NAA: NOT DETECTED

## 2019-08-28 ENCOUNTER — Other Ambulatory Visit: Payer: Self-pay

## 2019-08-28 ENCOUNTER — Encounter (HOSPITAL_COMMUNITY): Payer: Self-pay | Admitting: Physician Assistant

## 2019-08-28 ENCOUNTER — Inpatient Hospital Stay (HOSPITAL_COMMUNITY)
Admission: EM | Admit: 2019-08-28 | Discharge: 2019-09-06 | DRG: 280 | Disposition: A | Discharge status: Home / Self Care | Payer: Medicaid Other | Attending: Cardiovascular Disease | Admitting: Cardiovascular Disease

## 2019-08-28 ENCOUNTER — Encounter (HOSPITAL_COMMUNITY)
Admission: EM | Disposition: A | Discharge status: Home / Self Care | Payer: Self-pay | Source: Home / Self Care | Attending: Cardiovascular Disease

## 2019-08-28 DIAGNOSIS — I2109 ST elevation (STEMI) myocardial infarction involving other coronary artery of anterior wall: Secondary | ICD-10-CM | POA: Diagnosis present

## 2019-08-28 DIAGNOSIS — I241 Dressler's syndrome: Secondary | ICD-10-CM | POA: Diagnosis present

## 2019-08-28 DIAGNOSIS — I352 Nonrheumatic aortic (valve) stenosis with insufficiency: Secondary | ICD-10-CM | POA: Diagnosis present

## 2019-08-28 DIAGNOSIS — Z20822 Contact with and (suspected) exposure to covid-19: Secondary | ICD-10-CM | POA: Diagnosis present

## 2019-08-28 DIAGNOSIS — R0602 Shortness of breath: Secondary | ICD-10-CM

## 2019-08-28 DIAGNOSIS — I513 Intracardiac thrombosis, not elsewhere classified: Secondary | ICD-10-CM | POA: Diagnosis present

## 2019-08-28 DIAGNOSIS — I959 Hypotension, unspecified: Secondary | ICD-10-CM | POA: Diagnosis not present

## 2019-08-28 DIAGNOSIS — E782 Mixed hyperlipidemia: Secondary | ICD-10-CM

## 2019-08-28 DIAGNOSIS — I5043 Acute on chronic combined systolic (congestive) and diastolic (congestive) heart failure: Secondary | ICD-10-CM | POA: Diagnosis present

## 2019-08-28 DIAGNOSIS — F411 Generalized anxiety disorder: Secondary | ICD-10-CM | POA: Diagnosis present

## 2019-08-28 DIAGNOSIS — F17210 Nicotine dependence, cigarettes, uncomplicated: Secondary | ICD-10-CM | POA: Diagnosis present

## 2019-08-28 DIAGNOSIS — F329 Major depressive disorder, single episode, unspecified: Secondary | ICD-10-CM | POA: Diagnosis present

## 2019-08-28 DIAGNOSIS — K219 Gastro-esophageal reflux disease without esophagitis: Secondary | ICD-10-CM | POA: Diagnosis present

## 2019-08-28 DIAGNOSIS — E7849 Other hyperlipidemia: Secondary | ICD-10-CM | POA: Diagnosis present

## 2019-08-28 DIAGNOSIS — I5021 Acute systolic (congestive) heart failure: Secondary | ICD-10-CM

## 2019-08-28 DIAGNOSIS — I2510 Atherosclerotic heart disease of native coronary artery without angina pectoris: Secondary | ICD-10-CM | POA: Diagnosis present

## 2019-08-28 DIAGNOSIS — I255 Ischemic cardiomyopathy: Secondary | ICD-10-CM | POA: Diagnosis present

## 2019-08-28 DIAGNOSIS — Z79899 Other long term (current) drug therapy: Secondary | ICD-10-CM | POA: Diagnosis not present

## 2019-08-28 DIAGNOSIS — I219 Acute myocardial infarction, unspecified: Secondary | ICD-10-CM

## 2019-08-28 DIAGNOSIS — R570 Cardiogenic shock: Secondary | ICD-10-CM

## 2019-08-28 DIAGNOSIS — I213 ST elevation (STEMI) myocardial infarction of unspecified site: Secondary | ICD-10-CM

## 2019-08-28 DIAGNOSIS — Z789 Other specified health status: Secondary | ICD-10-CM

## 2019-08-28 DIAGNOSIS — Z452 Encounter for adjustment and management of vascular access device: Secondary | ICD-10-CM

## 2019-08-28 HISTORY — PX: CORONARY/GRAFT ACUTE MI REVASCULARIZATION: CATH118305

## 2019-08-28 HISTORY — PX: LEFT HEART CATH AND CORONARY ANGIOGRAPHY: CATH118249

## 2019-08-28 HISTORY — DX: Acute myocardial infarction, unspecified: I21.9

## 2019-08-28 HISTORY — DX: Heart failure, unspecified: I50.9

## 2019-08-28 LAB — HEMOGLOBIN A1C
Hgb A1c MFr Bld: 5.8 % — ABNORMAL HIGH (ref 4.8–5.6)
Mean Plasma Glucose: 119.76 mg/dL

## 2019-08-28 LAB — TSH: TSH: 3.181 u[IU]/mL (ref 0.350–4.500)

## 2019-08-28 LAB — COMPREHENSIVE METABOLIC PANEL
ALT: 115 U/L — ABNORMAL HIGH (ref 0–44)
AST: 339 U/L — ABNORMAL HIGH (ref 15–41)
Albumin: 4.3 g/dL (ref 3.5–5.0)
Alkaline Phosphatase: 63 U/L (ref 38–126)
Anion gap: 17 — ABNORMAL HIGH (ref 5–15)
BUN: 17 mg/dL (ref 6–20)
CO2: 18 mmol/L — ABNORMAL LOW (ref 22–32)
Calcium: 9.6 mg/dL (ref 8.9–10.3)
Chloride: 98 mmol/L (ref 98–111)
Creatinine, Ser: 1.1 mg/dL (ref 0.61–1.24)
GFR calc Af Amer: >60 mL/min (ref >60)
GFR calc non Af Amer: >60 mL/min (ref >60)
Glucose, Bld: 111 mg/dL — ABNORMAL HIGH (ref 70–99)
Potassium: 4.4 mmol/L (ref 3.5–5.1)
Sodium: 133 mmol/L — ABNORMAL LOW (ref 135–145)
Total Bilirubin: 1.1 mg/dL (ref 0.3–1.2)
Total Protein: 7.5 g/dL (ref 6.5–8.1)

## 2019-08-28 LAB — LDL CHOLESTEROL, DIRECT: Direct LDL: 133.9 mg/dL — ABNORMAL HIGH (ref 0–99)

## 2019-08-28 LAB — CBC WITH DIFFERENTIAL/PLATELET
Abs Immature Granulocytes: 0.21 10*3/uL — ABNORMAL HIGH (ref 0.00–0.07)
Basophils Absolute: 0.1 10*3/uL (ref 0.0–0.1)
Basophils Relative: 0 %
Eosinophils Absolute: 0.1 10*3/uL (ref 0.0–0.5)
Eosinophils Relative: 0 %
HCT: 50.2 % (ref 39.0–52.0)
Hemoglobin: 17 g/dL (ref 13.0–17.0)
Immature Granulocytes: 1 %
Lymphocytes Relative: 9 %
Lymphs Abs: 1.8 10*3/uL (ref 0.7–4.0)
MCH: 30.6 pg (ref 26.0–34.0)
MCHC: 33.9 g/dL (ref 30.0–36.0)
MCV: 90.3 fL (ref 80.0–100.0)
Monocytes Absolute: 2.2 10*3/uL — ABNORMAL HIGH (ref 0.1–1.0)
Monocytes Relative: 11 %
Neutro Abs: 15.8 10*3/uL — ABNORMAL HIGH (ref 1.7–7.7)
Neutrophils Relative %: 79 %
Platelets: 304 10*3/uL (ref 150–400)
RBC: 5.56 MIL/uL (ref 4.22–5.81)
RDW: 13.3 % (ref 11.5–15.5)
WBC: 20.1 10*3/uL — ABNORMAL HIGH (ref 4.0–10.5)
nRBC: 0 % (ref 0.0–0.2)

## 2019-08-28 LAB — LIPID PANEL
Cholesterol: 338 mg/dL — ABNORMAL HIGH (ref 0–200)
HDL: 44 mg/dL (ref >40)
LDL Cholesterol: UNDETERMINED mg/dL (ref 0–99)
Total CHOL/HDL Ratio: 7.7 RATIO
Triglycerides: 584 mg/dL — ABNORMAL HIGH (ref <150)
VLDL: UNDETERMINED mg/dL (ref 0–40)

## 2019-08-28 LAB — PROTIME-INR
INR: 1 (ref 0.8–1.2)
Prothrombin Time: 12.7 seconds (ref 11.4–15.2)

## 2019-08-28 LAB — MRSA PCR SCREENING: MRSA by PCR: NEGATIVE

## 2019-08-28 LAB — APTT: aPTT: 22 seconds — ABNORMAL LOW (ref 24–36)

## 2019-08-28 LAB — SARS CORONAVIRUS 2 BY RT PCR (HOSPITAL ORDER, PERFORMED IN ~~LOC~~ HOSPITAL LAB): SARS Coronavirus 2: NEGATIVE

## 2019-08-28 LAB — TROPONIN I (HIGH SENSITIVITY)
Troponin I (High Sensitivity): UNDETERMINED ng/L (ref <18)
Troponin I (High Sensitivity): >27000 ng/L (ref <18)

## 2019-08-28 SURGERY — CORONARY/GRAFT ACUTE MI REVASCULARIZATION
Anesthesia: LOCAL

## 2019-08-28 MED ORDER — ASPIRIN 81 MG PO CHEW
81.0000 mg | CHEWABLE_TABLET | Freq: Every day | ORAL | Status: DC
  Administered 2019-08-29 – 2019-09-06 (×9): 81 mg via ORAL
  Filled 2019-08-28 (×9): qty 1

## 2019-08-28 MED ORDER — PANTOPRAZOLE SODIUM 40 MG PO TBEC
40.0000 mg | DELAYED_RELEASE_TABLET | Freq: Two times a day (BID) | ORAL | Status: DC
  Administered 2019-08-28 – 2019-09-06 (×18): 40 mg via ORAL
  Filled 2019-08-28 (×18): qty 1

## 2019-08-28 MED ORDER — ONDANSETRON HCL 4 MG/2ML IJ SOLN: 4.0000 mg | Freq: Four times a day (QID) | INTRAMUSCULAR | Status: DC | PRN

## 2019-08-28 MED ORDER — ACETAMINOPHEN 325 MG PO TABS
650.0000 mg | ORAL_TABLET | ORAL | Status: DC | PRN
  Administered 2019-08-28 – 2019-09-02 (×7): 650 mg via ORAL
  Filled 2019-08-28 (×7): qty 2

## 2019-08-28 MED ORDER — SODIUM CHLORIDE 0.9 % IV SOLN: INTRAVENOUS | Status: DC

## 2019-08-28 MED ORDER — SODIUM CHLORIDE 0.9 % IV SOLN
INTRAVENOUS | Status: DC | PRN
  Administered 2019-08-28: 84 mL/h via INTRAVENOUS

## 2019-08-28 MED ORDER — SODIUM CHLORIDE 0.9% FLUSH: 3.0000 mL | INTRAVENOUS | Status: DC | PRN

## 2019-08-28 MED ORDER — HEPARIN SODIUM (PORCINE) 5000 UNIT/ML IJ SOLN
INTRAMUSCULAR | Status: AC
  Administered 2019-08-28: 4000 [IU] via INTRAVENOUS
  Filled 2019-08-28: qty 1

## 2019-08-28 MED ORDER — SODIUM CHLORIDE 0.9 % IV SOLN: INTRAVENOUS | Status: AC

## 2019-08-28 MED ORDER — MORPHINE SULFATE (PF) 2 MG/ML IV SOLN
2.0000 mg | INTRAVENOUS | Status: DC | PRN
  Administered 2019-08-28 – 2019-09-01 (×6): 2 mg via INTRAVENOUS
  Filled 2019-08-28 (×6): qty 1

## 2019-08-28 MED ORDER — IOHEXOL 350 MG/ML SOLN
INTRAVENOUS | Status: DC | PRN
  Administered 2019-08-28: 45 mL via INTRA_ARTERIAL

## 2019-08-28 MED ORDER — FENTANYL CITRATE (PF) 100 MCG/2ML IJ SOLN
25.0000 ug | INTRAMUSCULAR | Status: AC | PRN
  Administered 2019-08-29 – 2019-08-30 (×3): 25 ug via INTRAVENOUS
  Filled 2019-08-28 (×4): qty 2

## 2019-08-28 MED ORDER — MORPHINE SULFATE (PF) 4 MG/ML IV SOLN: 4.0000 mg | Freq: Once | INTRAVENOUS | Status: DC

## 2019-08-28 MED ORDER — HYDRALAZINE HCL 20 MG/ML IJ SOLN: 10.0000 mg | INTRAMUSCULAR | Status: AC | PRN

## 2019-08-28 MED ORDER — HEPARIN (PORCINE) IN NACL 1000-0.9 UT/500ML-% IV SOLN
INTRAVENOUS | Status: AC
  Filled 2019-08-28: qty 500

## 2019-08-28 MED ORDER — MIRTAZAPINE 15 MG PO TABS
30.0000 mg | ORAL_TABLET | Freq: Every day | ORAL | Status: DC
  Administered 2019-08-28 – 2019-09-05 (×9): 30 mg via ORAL
  Filled 2019-08-28 (×10): qty 2

## 2019-08-28 MED ORDER — CLOPIDOGREL BISULFATE 75 MG PO TABS
75.0000 mg | ORAL_TABLET | Freq: Every day | ORAL | Status: DC
  Administered 2019-08-29: 75 mg via ORAL
  Filled 2019-08-28 (×3): qty 1

## 2019-08-28 MED ORDER — NITROGLYCERIN 1 MG/10 ML FOR IR/CATH LAB
INTRA_ARTERIAL | Status: AC
  Filled 2019-08-28: qty 10

## 2019-08-28 MED ORDER — LABETALOL HCL 5 MG/ML IV SOLN: 10.0000 mg | INTRAVENOUS | Status: AC | PRN

## 2019-08-28 MED ORDER — OMEPRAZOLE MAGNESIUM 20 MG PO TBEC: 20.0000 mg | DELAYED_RELEASE_TABLET | Freq: Two times a day (BID) | ORAL | Status: DC

## 2019-08-28 MED ORDER — HEPARIN SODIUM (PORCINE) 5000 UNIT/ML IJ SOLN: 4000.0000 [IU] | Freq: Once | INTRAMUSCULAR | Status: AC

## 2019-08-28 MED ORDER — HEPARIN (PORCINE) IN NACL 1000-0.9 UT/500ML-% IV SOLN
INTRAVENOUS | Status: DC | PRN
  Administered 2019-08-28 (×2): 500 mL

## 2019-08-28 MED ORDER — HEPARIN SODIUM (PORCINE) 1000 UNIT/ML IJ SOLN
INTRAMUSCULAR | Status: DC | PRN
  Administered 2019-08-28: 4500 [IU] via INTRAVENOUS

## 2019-08-28 MED ORDER — CARVEDILOL 3.125 MG PO TABS
3.1250 mg | ORAL_TABLET | Freq: Two times a day (BID) | ORAL | Status: DC
  Administered 2019-08-28 – 2019-08-29 (×2): 3.125 mg via ORAL
  Filled 2019-08-28 (×2): qty 1

## 2019-08-28 MED ORDER — FENTANYL CITRATE (PF) 100 MCG/2ML IJ SOLN
INTRAMUSCULAR | Status: AC
  Administered 2019-08-28 (×2): 25 ug via INTRAVENOUS
  Filled 2019-08-28: qty 2

## 2019-08-28 MED ORDER — LIDOCAINE HCL (PF) 1 % IJ SOLN
INTRAMUSCULAR | Status: DC | PRN
  Administered 2019-08-28: 2 mL

## 2019-08-28 MED ORDER — SODIUM CHLORIDE 0.9% FLUSH
3.0000 mL | Freq: Two times a day (BID) | INTRAVENOUS | Status: DC
  Administered 2019-08-28 – 2019-09-06 (×16): 3 mL via INTRAVENOUS

## 2019-08-28 MED ORDER — SERTRALINE HCL 50 MG PO TABS
150.0000 mg | ORAL_TABLET | Freq: Every day | ORAL | Status: DC
  Administered 2019-08-28 – 2019-09-06 (×10): 150 mg via ORAL
  Filled 2019-08-28 (×10): qty 1

## 2019-08-28 MED ORDER — HEPARIN SODIUM (PORCINE) 5000 UNIT/ML IJ SOLN
5000.0000 [IU] | Freq: Three times a day (TID) | INTRAMUSCULAR | Status: DC
  Administered 2019-08-29 – 2019-08-30 (×4): 5000 [IU] via SUBCUTANEOUS
  Filled 2019-08-28 (×4): qty 1

## 2019-08-28 MED ORDER — SODIUM CHLORIDE 0.9 % IV SOLN: 250.0000 mL | INTRAVENOUS | Status: DC | PRN

## 2019-08-28 MED ORDER — ATORVASTATIN CALCIUM 80 MG PO TABS
80.0000 mg | ORAL_TABLET | Freq: Every day | ORAL | Status: DC
  Administered 2019-08-28: 80 mg via ORAL
  Filled 2019-08-28 (×2): qty 1

## 2019-08-28 MED ORDER — VERAPAMIL HCL 2.5 MG/ML IV SOLN
INTRAVENOUS | Status: AC
  Filled 2019-08-28: qty 2

## 2019-08-28 MED ORDER — VERAPAMIL HCL 2.5 MG/ML IV SOLN
INTRA_ARTERIAL | Status: DC | PRN
  Administered 2019-08-28: 5 mL via INTRA_ARTERIAL

## 2019-08-28 MED ORDER — SODIUM CHLORIDE 0.9 % IV SOLN
INTRAVENOUS | Status: AC | PRN
  Administered 2019-08-28: 10 mL/h via INTRAVENOUS

## 2019-08-28 MED ORDER — LIDOCAINE HCL (PF) 1 % IJ SOLN
INTRAMUSCULAR | Status: AC
  Filled 2019-08-28: qty 30

## 2019-08-28 MED ORDER — IOHEXOL 350 MG/ML SOLN
INTRAVENOUS | Status: AC
  Filled 2019-08-28: qty 1

## 2019-08-28 SURGICAL SUPPLY — 15 items
CATH INFINITI 5FR ANG PIGTAIL (CATHETERS) ×2 IMPLANT
CATH INFINITI JR4 5F (CATHETERS) ×2 IMPLANT
CATH OPTITORQUE TIG 4.0 5F (CATHETERS) ×2 IMPLANT
DEVICE RAD COMP TR BAND LRG (VASCULAR PRODUCTS) ×2 IMPLANT
GLIDESHEATH SLEND A-KIT 6F 22G (SHEATH) ×2 IMPLANT
GLIDESHEATH SLEND SS 6F .021 (SHEATH) IMPLANT
GUIDEWIRE INQWIRE 1.5J.035X260 (WIRE) ×1 IMPLANT
INQWIRE 1.5J .035X260CM (WIRE) ×2
KIT ENCORE 26 ADVANTAGE (KITS) ×2 IMPLANT
KIT HEART LEFT (KITS) ×2 IMPLANT
PACK CARDIAC CATHETERIZATION (CUSTOM PROCEDURE TRAY) ×2 IMPLANT
SHEATH PROBE COVER 6X72 (BAG) ×2 IMPLANT
TRANSDUCER W/STOPCOCK (MISCELLANEOUS) ×2 IMPLANT
TUBING CIL FLEX 10 FLL-RA (TUBING) ×2 IMPLANT
WIRE HI TORQ VERSACORE-J 145CM (WIRE) ×2 IMPLANT

## 2019-08-28 NOTE — ED Provider Notes (Signed)
[facility] EMERGENCY DEPARTMENT Provider Note   CSN: [id number] Arrival date & time: 08/28/19  1444     History No chief complaint on file.   Craig Flores is a 54 y.o. male.  HPI Patient presented to an urgent care for chest pain and shoulder and arm pain.  Patient was found to have ST elevation on EKG.  Patient is transferred by EMS as a code STEMI.  Patient reports for several days he has been having aching through his back and upper chest and shoulders.  He reports this happens periodically and he attributed it to migraine-like symptoms.  Symptoms persisted and did not improve prompted him to seek evaluation.  Patient was given 325 of aspirin and 1 nitroglycerin prior to arrival.  Patient reports that pain is somewhat improved but still has heaviness and pressure in his upper chest and back.  Is not rating into his arms as much as previously.  Patient denies any prior known cardiac history.  Patient does smoke.  No first-degree relatives with early coronary artery disease but advises he has had several grandfathers that died at early age with suspected cardiac disease.    Past Medical History:  Diagnosis Date   Acid reflux    Depression    Migraines     Patient Active Problem List   Diagnosis Date Noted   Major depressive disorder, recurrent, severe without psychotic features ([place])    Alcohol abuse    Alcohol dependence with withdrawal, uncomplicated ([place]) 62/83/6629   Generalized anxiety disorder 04/26/2014   Suicidal ideations 04/26/2014   Depression, major, single episode, severe ([place]) 04/26/2014   Substance induced mood disorder ([place]) 04/26/2014    No past surgical history on file.     Family History  Problem Relation Age of Onset   Arrhythmia Father     Social History   Tobacco Use   Smoking status: Current Every Day Smoker    Types: Cigarettes   Tobacco comment: x15 yrs as of 2021  Substance Use Topics   Alcohol use:  Not Currently    Comment: previous use but has not drank in months   Drug use: Not on file    Home Medications Prior to Admission medications   Medication Sig Start Date End Date Taking? Authorizing Provider  mirtazapine (REMERON) 30 MG tablet Take 1 tablet (30 mg total) by mouth at bedtime. 05/02/14   Niel Hummer, NP  Multiple Vitamin (MULTIVITAMIN WITH MINERALS) TABS tablet Take 1 tablet by mouth daily. 05/02/14   Niel Hummer, NP  omeprazole (PRILOSEC OTC) 20 MG tablet Take 1 tablet (20 mg total) by mouth 2 (two) times daily. 05/02/14   Niel Hummer, NP  sertraline (ZOLOFT) 100 MG tablet Take 1 tablet (100 mg total) by mouth daily. 05/02/14   Niel Hummer, NP    Allergies    Patient has no known allergies.  Review of Systems   Review of Systems 10 systems reviewed and negative except as per HPI Physical Exam Updated Vital Signs BP (!) 116/91 (BP Location: Right Arm)    Pulse 92    Temp 97.6 F (36.4 C) (Tympanic)    Resp 16    Ht 5\' 9"  (1.753 m)    Wt 83.9 kg    SpO2 94%    BMI 27.32 kg/m   Physical Exam Constitutional:      Comments: Alert.  Clear mental status.  No respiratory distress at rest.  HENT:  Head: Normocephalic and atraumatic.     Mouth/Throat:     Pharynx: Oropharynx is clear.  Eyes:     Extraocular Movements: Extraocular movements intact.  Cardiovascular:     Comments: Borderline tachycardia.  No appreciable rub murmur gallop. Pulmonary:     Effort: Pulmonary effort is normal.     Breath sounds: Normal breath sounds.  Abdominal:     General: There is no distension.     Palpations: Abdomen is soft.     Tenderness: There is no abdominal tenderness.  Musculoskeletal:        General: No swelling or tenderness. Normal range of motion.     Right lower leg: No edema.     Left lower leg: No edema.  Skin:    General: Skin is warm and dry.  Neurological:     General: No focal deficit present.     Mental Status: He is oriented to person, place, and  time.     Coordination: Coordination normal.  Psychiatric:        Mood and Affect: Mood normal.     ED Results / Procedures / Treatments   Labs (all labs ordered are listed, but only abnormal results are displayed) Labs Reviewed  SARS CORONAVIRUS 2 BY RT PCR (HOSPITAL ORDER, [facility])  HEMOGLOBIN A1C  CBC WITH DIFFERENTIAL/PLATELET  PROTIME-INR  APTT  COMPREHENSIVE METABOLIC PANEL  LIPID PANEL  TSH  RAPID URINE DRUG SCREEN, HOSP PERFORMED  TROPONIN I (HIGH SENSITIVITY)    EKG EKG Interpretation  Date/Time:  Sunday August 28 2019 14:51:09 EDT Ventricular Rate:  91 PR Interval:    QRS Duration: 89 QT Interval:  344 QTC Calculation: 424 R Axis:   -115 Text Interpretation: Sinus rhythm Anterolateral infarct, acute (LAD) >>> Acute MI <<< agree. no old comparison Confirmed by Charlesetta Shanks [id number]) on 08/28/2019 3:02:11 PM   Radiology No results found.  Procedures Procedures (including critical care time) CRITICAL CARE Performed by: Charlesetta Shanks   Total critical care time: 20 minutes  Critical care time was exclusive of separately billable procedures and treating other patients.  Critical care was necessary to treat or prevent imminent or life-threatening deterioration.  Critical care was time spent personally by me on the following activities: development of treatment plan with patient and/or surrogate as well as nursing, discussions with consultants, evaluation of patient's response to treatment, examination of patient, obtaining history from patient or surrogate, ordering and performing treatments and interventions, ordering and review of laboratory studies, ordering and review of radiographic studies, pulse oximetry and re-evaluation of patient's condition. Medications Ordered in ED Medications  0.9 %  sodium chloride infusion (has no administration in time range)  morphine 4 MG/ML injection 4 mg (has no administration in time range)   heparin injection 4,000 Units (4,000 Units Intravenous Given 08/28/19 1457)    ED Course  I have reviewed the triage vital signs and the nursing notes.  Pertinent labs & imaging results that were available during my care of the patient were reviewed by me and considered in my medical decision making (see chart for details).    MDM Rules/Calculators/A&P                          Patient is transported by EMS from urgent care for ST elevation MI.  Repeat EKG confirms inferior and lateral ST elevation.  Patient has had several days of chest pain with shoulder and arm pain as  well.  Patient's rhythm is sinus.  Blood pressures are normotensive.  Patient had aspirin prior to arrival.  Will initiate heparin and morphine.  Patient is being evaluated by cardiology for emergent catheterization.  Airway stable, mental status clear, respiratory status stable.  At this time, stable for transport to Cath Lab. Final Clinical Impression(s) / ED Diagnoses Final diagnoses:  ST elevation myocardial infarction (STEMI), unspecified artery [facility])    Rx / DC Orders ED Discharge Orders    None       Charlesetta Shanks, MD 08/28/19 1503

## 2019-08-28 NOTE — Progress Notes (Signed)
°   08/28/19 1515  Clinical Encounter Type  Visited With Patient;Health care provider  Visit Type Initial;ED;Pre-op   Chaplain responded to a code STEMI in the ED. Met briefly with the patient before he went to the Cath Lab. Patient indicated his wife is on the way. Chaplain indicated that he would be glad to walk her to the [facility] waiting area for further information and consultation. Chaplain informed the ED waiting room staff as well. Spiritual care services available as needed.   Jeri Lager, Chaplain

## 2019-08-28 NOTE — Progress Notes (Signed)
CRITICAL VALUE ALERT  Critical Value:  Troponin 27,000  Date & Time Notied: 08/28/2019  2030  Provider Notified: Vickki Muff   Orders Received/Actions taken:   Continuing to monitor pain and hemodynamics

## 2019-08-28 NOTE — Progress Notes (Cosign Needed)
Home med rec not yet verified. Order written for nurse to call when completed by pharmacy - I also called pharmacy team to come do and to page fellow when this is complete. Will s/o to oncoming coverage so they are aware this is pending.  Lorelee Mclaurin PA-C

## 2019-08-28 NOTE — H&P (Addendum)
Cardiology Admission History and Physical:   Patient ID: Craig Flores MRN: [id number]; DOB: [birth year]   Admission date: 08/28/2019  Primary Care Provider: No primary care provider on file. [facility] Cardiologist: Craig Burow, MD  (new) [facility] Electrophysiologist:  None   Chief Complaint:  Chest pain  Patient Profile:   Craig Flores is a 54 y.o. male with prior depression but no cardiac hx presented to [facility] with persistent chest pain since Friday night (08/26/19). EKG concerning for anterolateral STEMI.  History of Present Illness:   Craig Flores did not feel like himself the last week. He felt like he might have a cold with a general feeling of fatigue and sinus headache. No fevers/chills or sick contacts. Vaccinated for Covid back in May. On Friday night he began feeling central bandlike chest pain that went somewhat upward toward his neck. He has h/o headaches so assumed it was similar process and did not take action. This persisted all day yesterday into today so he went to urgent care where EMS was called due to concern for STEMI. He was apparently swabbed for Covid there and was negative. He was given 324mg  ASA by EMS and 1 SL NTG with improvement in pain slightly down to a 4/10. His BP was slightly soft in the low 100s so further NTG withheld. EKG shows concern for Q waves anteriorly suggestive that this MI is late presenting. He reports tobacco use x 15 years, no recent ETOH, denies illicit drug use. + Fam hx of arrhythmia in father but no CAD.  Upon eval in the ED he reports mild ongoing tightness in his chest but primarily notes significant increase in acute chest pain when he takes a deep breath in.  Past Medical History:  Diagnosis Date   Acid reflux    Depression    Migraines     No past surgical history on file.   Medications Prior to Admission: Prior to Admission medications   Medication Sig Start Date End Date Taking? Authorizing  Provider  mirtazapine (REMERON) 30 MG tablet Take 1 tablet (30 mg total) by mouth at bedtime. 05/02/14   Craig Hummer, NP  Multiple Vitamin (MULTIVITAMIN WITH MINERALS) TABS tablet Take 1 tablet by mouth daily. 05/02/14   Craig Hummer, NP  omeprazole (PRILOSEC OTC) 20 MG tablet Take 1 tablet (20 mg total) by mouth 2 (two) times daily. 05/02/14   Craig Hummer, NP  sertraline (ZOLOFT) 100 MG tablet Take 1 tablet (100 mg total) by mouth daily. 05/02/14   Craig Hummer, NP     Allergies:   No Known Allergies  Social History:   Social History   Socioeconomic History   Marital status: Married    Spouse name: Not on file   Number of children: Not on file   Years of education: Not on file   Highest education level: Not on file  Occupational History   Not on file  Tobacco Use   Smoking status: Current Every Day Smoker    Types: Cigarettes   Tobacco comment: x15 yrs as of 2021  Substance and Sexual Activity   Alcohol use: Not Currently    Comment: previous use but has not drank in months   Drug use: Not on file   Sexual activity: Yes    Birth control/protection: None  Other Topics Concern   Not on file  Social History Narrative   Not on file   Social Determinants of Health  Financial Resource Strain:    Difficulty of Paying Living Expenses:   Food Insecurity:    Worried About Charity fundraiser in the Last Year:    Arboriculturist in the Last Year:   Transportation Needs:    Film/video editor (Medical):    Lack of Transportation (Non-Medical):   Physical Activity:    Days of Exercise per Week:    Minutes of Exercise per Session:   Stress:    Feeling of Stress :   Social Connections:    Frequency of Communication with Friends and Family:    Frequency of Social Gatherings with Friends and Family:    Attends Religious Services:    Active Member of Clubs or Organizations:    Attends Music therapist:    Marital Status:   Intimate Partner Violence:     Fear of Current or Ex-Partner:    Emotionally Abused:    Physically Abused:    Sexually Abused:     Family History:   The patient's family history includes Arrhythmia in his father.    ROS:  Please see the history of present illness.  All other ROS reviewed and negative.     Physical Exam/Data:   Vitals:   08/28/19 1454 08/28/19 1456  BP: (!) 116/91   Pulse: 92   Resp: 16   Temp: 97.6 F (36.4 C)   TempSrc: Tympanic   SpO2: 94%   Weight:  83.9 kg  Height:  5\' 9"  (1.753 m)   No intake or output data in the 24 hours ending 08/28/19 1506 Last 3 Weights 08/28/2019  Weight (lbs) 185 lb  Weight (kg) 83.915 kg  Some encounter information is confidential and restricted. Go to Review Flowsheets activity to see all data.     Body mass index is 27.32 kg/m.  General: Well developed, well nourished, in no acute distress Head: Normocephalic, atraumatic, sclera non-icteric, no xanthomas, nares are without discharge. Neck: Negative for carotid bruits. JVP not elevated. Lungs: Clear bilaterally to auscultation without wheezes, rales, or rhonchi. Breathing is unlabored. Heart: RRR S1 S2 without murmurs, rubs, or gallops.  Abdomen: Soft, non-tender, non-distended with normoactive bowel sounds. No rebound/guarding. Extremities: No clubbing or cyanosis. No edema. Distal pedal pulses are 2+ and equal bilaterally. Neuro: Alert and oriented X 3. Moves all extremities spontaneously. Psych:  Responds to questions appropriately with a normal affect.  EKG:  The ECG that was done by EMS was personally reviewed and demonstrates NSR 95bpm with diffuse ST elevation V2-V6, also I, II, avL (max 32mm V4), TWI aVR  Relevant CV Studies: N/A  Laboratory Data: Pending  Radiology/Studies:  No results Flores.     TIMI Risk Score for ST  Elevation MI:   The patient's TIMI risk score is 2, which indicates a 2.2% risk of all cause mortality at 30 days.     Assessment and Plan:   1. Suspected late  presenting anterior MI, now with pleuritic chest pain  - to cath lab emergently, further recs pending based on findings - labs pending - addendum: cath done, total mid LAD w/o collaterals, completed anterior MI, LVEDP 17 - Dr. Gwenlyn Flores has done post-cath orders to include continuation of ASA 81mg , addition of Plavix, 2D echo, atorvastatin 80mg   - per our discussion will also add carvedilol 3.125mg  BID and DVT ppx with heparin [place] starting in AM - he feels patient may need Lifevest as he may wind up with apical aneurysm - this can be  formally ordered tomorrow when echo results  2. Potential recent URI/sinus headache - UC Covid swab negative - rapid Covid done here - otherwise well appearing  3. Depression - home med rec pending  Severity of Illness: The appropriate patient status for this patient is INPATIENT. Inpatient status is judged to be reasonable and necessary in order to provide the required intensity of service to ensure the patient's safety. The patient's presenting symptoms, physical exam findings, and initial radiographic and laboratory data in the context of their chronic comorbidities is felt to place them at high risk for further clinical deterioration. Furthermore, it is not anticipated that the patient will be medically stable for discharge from the hospital within 2 midnights of admission. The following factors support the patient status of inpatient.   " The patient's presenting symptoms include chest pain since Friday. " The worrisome physical exam findings include complaints of chest pain (pointing). " The initial radiographic and laboratory data are worrisome because of abnormal EKG. " The chronic co-morbidities include depression.   * I certify that at the point of admission it is my clinical judgment that the patient will require inpatient hospital care spanning beyond 2 midnights from the point of admission due to high intensity of service, high risk for further  deterioration and high frequency of surveillance required.*    For questions or updates, please contact [place] Please consult [email or web address] for contact info under     Signed, Charlie Pitter, PA-C  08/28/2019 3:06 PM   Agree with findings by Melina Copa PA-C   54 y/o MWM w/o prior cardiac Hx developed CP that awakened from sleep Friday night after not feeling well all week. He had persistent SSCP until Sunday afternoon when he presented to an Urgent [place] and was Flores to have anterolateral ST elevation. He was transported to [facility] for urgent cath. His only CRF was tobacco abuse. He was hemodynamically stable.  [name], M.D., [place], [facility], [place] [address] [place], [place]  [address]  [phone] 08/29/2019 6:26 AM

## 2019-08-28 NOTE — ED Triage Notes (Signed)
Pt Code Stemi from urgent via [facility]. Generalized Chest pain x 2 days with radiation down L arm, which he thought  Was related to his normal migraine symptoms. Did not improve so went to urgent care this morning. Given 324 ASA and one nitro.

## 2019-08-29 ENCOUNTER — Inpatient Hospital Stay (HOSPITAL_COMMUNITY): Payer: Medicaid Other

## 2019-08-29 ENCOUNTER — Encounter (HOSPITAL_COMMUNITY): Payer: Self-pay | Admitting: Cardiovascular Disease

## 2019-08-29 DIAGNOSIS — I350 Nonrheumatic aortic (valve) stenosis: Secondary | ICD-10-CM

## 2019-08-29 DIAGNOSIS — E785 Hyperlipidemia, unspecified: Secondary | ICD-10-CM

## 2019-08-29 DIAGNOSIS — I255 Ischemic cardiomyopathy: Secondary | ICD-10-CM

## 2019-08-29 DIAGNOSIS — I493 Ventricular premature depolarization: Secondary | ICD-10-CM

## 2019-08-29 DIAGNOSIS — R7989 Other specified abnormal findings of blood chemistry: Secondary | ICD-10-CM

## 2019-08-29 LAB — ECHOCARDIOGRAM COMPLETE
AR max vel: 1.17 cm2
AV Area VTI: 1.31 cm2
AV Area mean vel: 1.17 cm2
AV Mean grad: 19 mmHg
AV Peak grad: 30.7 mmHg
Ao pk vel: 2.77 m/s
Calc EF: 39.8 %
Height: 69 in
S' Lateral: 2.9 cm
Single Plane A2C EF: 48.9 %
Single Plane A4C EF: 27.4 %
Weight: 2960 oz

## 2019-08-29 LAB — BASIC METABOLIC PANEL
Anion gap: 13 (ref 5–15)
BUN: 16 mg/dL (ref 6–20)
CO2: 20 mmol/L — ABNORMAL LOW (ref 22–32)
Calcium: 9.3 mg/dL (ref 8.9–10.3)
Chloride: 99 mmol/L (ref 98–111)
Creatinine, Ser: 1 mg/dL (ref 0.61–1.24)
GFR calc Af Amer: >60 mL/min (ref >60)
GFR calc non Af Amer: >60 mL/min (ref >60)
Glucose, Bld: 163 mg/dL — ABNORMAL HIGH (ref 70–99)
Potassium: 3.6 mmol/L (ref 3.5–5.1)
Sodium: 132 mmol/L — ABNORMAL LOW (ref 135–145)

## 2019-08-29 LAB — POCT I-STAT, CHEM 8
BUN: 19 mg/dL (ref 6–20)
Calcium, Ion: 1.15 mmol/L (ref 1.15–1.40)
Chloride: 102 mmol/L (ref 98–111)
Creatinine, Ser: 1 mg/dL (ref 0.61–1.24)
Glucose, Bld: 117 mg/dL — ABNORMAL HIGH (ref 70–99)
HCT: 48 % (ref 39.0–52.0)
Hemoglobin: 16.3 g/dL (ref 13.0–17.0)
Potassium: 3.7 mmol/L (ref 3.5–5.1)
Sodium: 136 mmol/L (ref 135–145)
TCO2: 21 mmol/L — ABNORMAL LOW (ref 22–32)

## 2019-08-29 LAB — TROPONIN I (HIGH SENSITIVITY): Troponin I (High Sensitivity): >27000 ng/L (ref <18)

## 2019-08-29 MED ORDER — PERFLUTREN LIPID MICROSPHERE
1.0000 mL | INTRAVENOUS | Status: DC | PRN
  Administered 2019-08-29: 3 mL via INTRAVENOUS
  Filled 2019-08-29: qty 10

## 2019-08-29 MED ORDER — CHLORHEXIDINE GLUCONATE CLOTH 2 % EX PADS
6.0000 | MEDICATED_PAD | Freq: Every day | CUTANEOUS | Status: DC
  Administered 2019-08-30 – 2019-09-06 (×8): 6 via TOPICAL

## 2019-08-29 MED ORDER — CARVEDILOL 6.25 MG PO TABS
6.2500 mg | ORAL_TABLET | Freq: Two times a day (BID) | ORAL | Status: DC
  Administered 2019-08-29 – 2019-08-30 (×3): 6.25 mg via ORAL
  Filled 2019-08-29 (×4): qty 1

## 2019-08-29 NOTE — Progress Notes (Signed)
EKG CRITICAL VALUE     12 lead EKG performed.  Critical value noted Kennedy Bucker,  RN notified.   Taylin Leder, CCT 08/29/2019 10:31 AM

## 2019-08-29 NOTE — Progress Notes (Signed)
Progress Note  Patient Name: Jessejames Steelman Date of Encounter: 08/29/2019  Primary Cardiologist: Victory Dakin  Subjective   Mild pleuritic discomfort   Inpatient Medications    Scheduled Meds: . aspirin  81 mg Oral Daily  . atorvastatin  80 mg Oral Daily  . carvedilol  3.125 mg Oral BID WC  . Chlorhexidine Gluconate Cloth  6 each Topical Daily  . clopidogrel  75 mg Oral Q breakfast  . heparin injection (subcutaneous)  5,000 Units Subcutaneous Q8H  . mirtazapine  30 mg Oral QHS  . pantoprazole  40 mg Oral BID  . sertraline  150 mg Oral Daily  . sodium chloride flush  3 mL Intravenous Q12H   Continuous Infusions: . sodium chloride     PRN Meds: sodium chloride, acetaminophen, fentaNYL (SUBLIMAZE) injection, morphine injection, ondansetron (ZOFRAN) IV, sodium chloride flush   Vital Signs    Vitals:   08/29/19 0600 08/29/19 0700 08/29/19 0753 08/29/19 0819  BP: 112/84 93/81 (!) 113/89   Pulse: 103 (!) 108 (!) 116   Resp: (!) 32 (!) 24    Temp:    99.8 F (37.7 C)  TempSrc:    Oral  SpO2: 91% 92%    Weight:      Height:        Intake/Output Summary (Last 24 hours) at 08/29/2019 0836 Last data filed at 08/28/2019 2200 Gross per 24 hour  Intake 362.89 ml  Output 300 ml  Net 62.89 ml    I/O since admission: +62  Filed Weights   08/28/19 1456  Weight: 83.9 kg    Telemetry    Sinus tachycardia at 120 with occasional PVC- Personally Reviewed  ECG    ECG (independently read by me): NSR 92; QS V2-V6, I aVL and inferiorly  Physical Exam    BP (!) 113/89   Pulse (!) 116   Temp 99.8 F (37.7 C) (Oral)   Resp (!) 24   Ht 5\' 9"  (1.753 m)   Wt 83.9 kg   SpO2 92%   BMI 27.32 kg/m  General: Alert, oriented, no distress.  Skin: normal turgor, no rashes, warm and dry HEENT: Normocephalic, atraumatic. Pupils equal round and reactive to light; sclera anicteric; extraocular muscles intact; Nose without nasal septal hypertrophy Mouth/Parynx benign; Mallinpatti  scale Neck: No JVD, no carotid bruits; normal carotid upstroke Lungs: clear to ausculatation and percussion; no wheezing or rales Chest wall: without tenderness to palpitation Heart: PMI not displaced, tachycardic, s1 s2 normal, 1/6 systolic murmur, no diastolic murmur, no rubs, gallops, thrills, or heaves Abdomen: soft, nontender; no hepatosplenomehaly, BS+; abdominal aorta nontender and not dilated by palpation. Back: no CVA tenderness Pulses 2+: R radial site stable Musculoskeletal: full range of motion, normal strength, no joint deformities Extremities: congenital missing of left fingers Neurologic: grossly nonfocal; Cranial nerves grossly wnl Psychologic: Normal mood and affect   Labs    Chemistry Recent Labs  Lab 08/28/19 1458  NA 133*  K 4.4  CL 98  CO2 18*  GLUCOSE 111*  BUN 17  CREATININE 1.10  CALCIUM 9.6  PROT 7.5  ALBUMIN 4.3  AST 339*  ALT 115*  ALKPHOS 63  BILITOT 1.1  GFRNONAA >60  GFRAA >60  ANIONGAP 17*     Hematology Recent Labs  Lab 08/28/19 1458  WBC 20.1*  RBC 5.56  HGB 17.0  HCT 50.2  MCV 90.3  MCH 30.6  MCHC 33.9  RDW 13.3  PLT 304   HS Trop > 27,  000  Cardiac EnzymesNo results for input(s): TROPONINI in the last 168 hours. No results for input(s): TROPIPOC in the last 168 hours.   BNPNo results for input(s): BNP, PROBNP in the last 168 hours.   DDimer No results for input(s): DDIMER in the last 168 hours.   Lipid Panel     Component Value Date/Time   CHOL 338 (H) 08/28/2019 1458   TRIG 584 (H) 08/28/2019 1458   HDL 44 08/28/2019 1458   CHOLHDL 7.7 08/28/2019 1458   VLDL UNABLE TO CALCULATE IF TRIGLYCERIDE OVER 400 mg/dL 08/28/2019 1458   LDLCALC UNABLE TO CALCULATE IF TRIGLYCERIDE OVER 400 mg/dL 08/28/2019 1458   LDLDIRECT 133.9 (H) 08/28/2019 1458     Radiology    CARDIAC CATHETERIZATION  Result Date: 08/28/2019  Mid LAD lesion is 100% stenosed.  Prox RCA to Mid RCA lesion is 40% stenosed.  Morgon Pamer is a  54 y.o. male  [id number] LOCATION:  FACILITY: [facility] PHYSICIAN: Quay Burow, M.D. 1965/05/17 DATE OF PROCEDURE:  08/28/2019 DATE OF DISCHARGE: CARDIAC CATHETERIZATION History obtained from chart review.  54 year old mildly overweight married Caucasian male without prior cardiac history.  He is on no medications.  He has smoked for last 15 years.  He was not feeling well last week and developed chest pain that awakened him from sleep on Friday night.  The pain persisted until today when it was more pleuritic.  He went to urgent care where he was found to have anterolateral ST segment ovation with Q waves across his precordium and was transferred by EMS to [facility] for further evaluation.  He was Covid negative.   [name] Has is over 36 hours out from anterior STEMI with ongoing chest pain since Friday morning when he was awakened.  The pain is nonpleuritic.  His EKG shows Q waves across his precordium with persistent ST segment elevation.  His cath shows an occluded mid LAD without collaterals.  I suspect he has a completed infarct now is having post MI pericarditis.  There is no benefit for revascularization.  I did not do an LV gram since I am concerned that he may have an apical mural thrombus.  We will obtain a 2D echo.  We talked about the importance of smoking cessation.  We will start guideline directed optimal medical therapy including beta-blocker, ARB and high-dose statin blood pressure permitting.  I will also start Plavix and low-dose aspirin.  He does have mild to moderate segmental mid dominant RCA stenosis that does not appear to be hemodynamically significant.  I suspect that he will have severe LV dysfunction will probably benefit from wearing a LifeVest for 3 months prior to rechecking his LV function. Quay Burow. MD, [facility] 08/28/2019 3:53 PM    Cardiac Studies     Patient Profile     Asaad Gulley is a 54 y.o. male with prior depression but no cardiac hx presented to [facility] with persistent chest pain since Friday night (08/26/19). EKG concerning for anterolateral STEMI.   Assessment & Plan    1. Delayed presentation Anterior Stemi: > 36 hours presentation with persistent STE and diffuse Q waves.Tro > 27,000  2. Ischemic cardiomyopathy: Echo to be done; high lilkihood for development of apical thrombus and need for anticoagulation.  With HR 120 and following 2 doses of coreg 3.125 mg, will titrate to 5.25 mg.  Will need to initiate ARB and transition to entresto.  3. Marked hyperlipidemia: Chol 338; TG 584. Was  started on atorvastatin ? Familial Hyperlipidemia.  Will hold atorvastatin now with increased LFT from MI  4. Increased LFTs: AST 339; ALT 115; probably contributed by MI; re check  5. Tobacco abuse: 15 yr history was currently almost 1 ppd.  6. Depression: on sertraline and mirtazapine at home.  Signed, Troy Sine, MD, [facility] 08/29/2019, 8:36 AM

## 2019-08-29 NOTE — Progress Notes (Addendum)
  Echocardiogram 2D Echocardiogram has been performed with [facility].  Craig Flores 08/29/2019, 12:21 PM

## 2019-08-29 NOTE — Progress Notes (Signed)
CARDIAC REHAB PHASE I   Began MI education with pt. Pt given MI book and heart healthy diet. Reviewed site care and restrictions. Pt preferring to walk later today after echo, will f/u as time allows. Pt interested in smoking cessation, tip sheet given, support and encouragement provided.   [phone] Rufina Falco, RN BSN 08/29/2019 10:14 AM

## 2019-08-29 NOTE — Plan of Care (Signed)
  Problem: Education: Goal: Understanding of CV disease, CV risk reduction, and recovery process will improve Outcome: Progressing  Problem: Cardiovascular: Goal: Vascular access site(s) Level 0-1 will be maintained Outcome: Progressing   Problem: Health Behavior/Discharge Planning: Goal: Ability to safely manage health-related needs after discharge will improve Outcome: Progressing     Problem: Cardiovascular: Goal: Ability to achieve and maintain adequate cardiovascular perfusion will improve Outcome: Progressing

## 2019-08-30 DIAGNOSIS — I513 Intracardiac thrombosis, not elsewhere classified: Secondary | ICD-10-CM

## 2019-08-30 LAB — COMPREHENSIVE METABOLIC PANEL
ALT: 58 U/L — ABNORMAL HIGH (ref 0–44)
AST: 94 U/L — ABNORMAL HIGH (ref 15–41)
Albumin: 3 g/dL — ABNORMAL LOW (ref 3.5–5.0)
Alkaline Phosphatase: 56 U/L (ref 38–126)
Anion gap: 13 (ref 5–15)
BUN: 11 mg/dL (ref 6–20)
CO2: 23 mmol/L (ref 22–32)
Calcium: 8.9 mg/dL (ref 8.9–10.3)
Chloride: 96 mmol/L — ABNORMAL LOW (ref 98–111)
Creatinine, Ser: 0.84 mg/dL (ref 0.61–1.24)
GFR calc Af Amer: >60 mL/min (ref >60)
GFR calc non Af Amer: >60 mL/min (ref >60)
Glucose, Bld: 106 mg/dL — ABNORMAL HIGH (ref 70–99)
Potassium: 3.6 mmol/L (ref 3.5–5.1)
Sodium: 132 mmol/L — ABNORMAL LOW (ref 135–145)
Total Bilirubin: 1 mg/dL (ref 0.3–1.2)
Total Protein: 6.3 g/dL — ABNORMAL LOW (ref 6.5–8.1)

## 2019-08-30 LAB — LIPID PANEL
Cholesterol: 236 mg/dL — ABNORMAL HIGH (ref 0–200)
HDL: 29 mg/dL — ABNORMAL LOW (ref >40)
LDL Cholesterol: UNDETERMINED mg/dL (ref 0–99)
Total CHOL/HDL Ratio: 8.1 RATIO
Triglycerides: 495 mg/dL — ABNORMAL HIGH (ref <150)
VLDL: UNDETERMINED mg/dL (ref 0–40)

## 2019-08-30 LAB — LDL CHOLESTEROL, DIRECT: Direct LDL: 99.9 mg/dL — ABNORMAL HIGH (ref 0–99)

## 2019-08-30 LAB — HEPARIN LEVEL (UNFRACTIONATED)
Heparin Unfractionated: 0.15 IU/mL — ABNORMAL LOW (ref 0.30–0.70)
Heparin Unfractionated: 0.29 IU/mL — ABNORMAL LOW (ref 0.30–0.70)

## 2019-08-30 MED ORDER — COUMADIN BOOK
Freq: Once | Status: AC
  Filled 2019-08-30: qty 1

## 2019-08-30 MED ORDER — ATORVASTATIN CALCIUM 80 MG PO TABS
80.0000 mg | ORAL_TABLET | Freq: Every day | ORAL | Status: DC
  Administered 2019-08-30 – 2019-09-06 (×8): 80 mg via ORAL
  Filled 2019-08-30 (×8): qty 1

## 2019-08-30 MED ORDER — HEPARIN (PORCINE) 25000 UT/250ML-% IV SOLN
2050.0000 [IU]/h | INTRAVENOUS | Status: DC
  Administered 2019-08-30: 1200 [IU]/h via INTRAVENOUS
  Administered 2019-08-31 (×2): 1550 [IU]/h via INTRAVENOUS
  Administered 2019-09-01: 1700 [IU]/h via INTRAVENOUS
  Administered 2019-09-02: 2000 [IU]/h via INTRAVENOUS
  Administered 2019-09-03 – 2019-09-04 (×3): 2300 [IU]/h via INTRAVENOUS
  Administered 2019-09-04 – 2019-09-06 (×3): 2050 [IU]/h via INTRAVENOUS
  Filled 2019-08-30 (×14): qty 250

## 2019-08-30 MED ORDER — LOSARTAN POTASSIUM 25 MG PO TABS
12.5000 mg | ORAL_TABLET | Freq: Every day | ORAL | Status: DC
  Administered 2019-08-30 – 2019-08-31 (×2): 12.5 mg via ORAL
  Filled 2019-08-30 (×2): qty 1

## 2019-08-30 MED ORDER — ICOSAPENT ETHYL 1 G PO CAPS
2.0000 g | ORAL_CAPSULE | Freq: Two times a day (BID) | ORAL | Status: DC
  Administered 2019-08-30 – 2019-09-06 (×14): 2 g via ORAL
  Filled 2019-08-30 (×20): qty 2

## 2019-08-30 MED ORDER — POTASSIUM CHLORIDE CRYS ER 20 MEQ PO TBCR
40.0000 meq | EXTENDED_RELEASE_TABLET | Freq: Once | ORAL | Status: AC
  Administered 2019-08-30: 40 meq via ORAL
  Filled 2019-08-30: qty 2

## 2019-08-30 MED ORDER — WARFARIN SODIUM 7.5 MG PO TABS
7.5000 mg | ORAL_TABLET | Freq: Once | ORAL | Status: AC
  Administered 2019-08-30: 7.5 mg via ORAL
  Filled 2019-08-30: qty 1

## 2019-08-30 MED ORDER — WARFARIN - PHARMACIST DOSING INPATIENT: Freq: Every day | Status: DC

## 2019-08-30 NOTE — Progress Notes (Signed)
ANTICOAGULATION CONSULT NOTE   Pharmacy Consult for Heparin and Warfarin Indication: LV thrombus  No Known Allergies  Patient Measurements: Height: 5\' 9"  (175.3 cm) Weight: 83.9 kg (185 lb) IBW/kg (Calculated) : 70.7 Heparin Dosing Weight: 83.9 kg  Vital Signs: Temp: 98.5 F (36.9 C) (08/03 2000) Temp Source: Oral (08/03 2000) BP: 91/72 (08/03 2000) Pulse Rate: 91 (08/03 2000)  Labs: Recent Labs    08/28/19 1458 08/28/19 1458 08/28/19 1530 08/28/19 1856 08/29/19 0921 08/29/19 1223 08/30/19 0151 08/30/19 1345 08/30/19 2211  HGB 17.0  --  16.3  --   --   --   --   --   --   HCT 50.2  --  48.0  --   --   --   --   --   --   PLT 304  --   --   --   --   --   --   --   --   APTT 22*  --   --   --   --   --   --   --   --   LABPROT 12.7  --   --   --   --   --   --   --   --   INR 1.0  --   --   --   --   --   --   --   --   HEPARINUNFRC  --   --   --   --   --   --   --  0.15* 0.29*  CREATININE 1.10   < > 1.00  --  1.00  --  0.84  --   --   TROPONINIHS QUANTITY NOT SUFFICIENT, UNABLE TO PERFORM TEST  --   --  >27,000*  --  >27,000*  --   --   --    < > = values in this interval not displayed.    Estimated Creatinine Clearance: 100.5 mL/min (by C-G formula based on SCr of 0.84 mg/dL).   Medical History: Past Medical History:  Diagnosis Date  . Acid reflux   . Depression   . Migraines     Medications:  Scheduled:  . aspirin  81 mg Oral Daily  . atorvastatin  80 mg Oral Daily  . carvedilol  6.25 mg Oral BID WC  . Chlorhexidine Gluconate Cloth  6 each Topical Daily  . icosapent Ethyl  2 g Oral BID  . losartan  12.5 mg Oral Daily  . mirtazapine  30 mg Oral QHS  . pantoprazole  40 mg Oral BID  . sertraline  150 mg Oral Daily  . sodium chloride flush  3 mL Intravenous Q12H  . Warfarin - Pharmacist Dosing Inpatient   Does not apply q1600    Assessment: Craig Flores is a 54 YO male with no prior cardiac hx presented with persistent chest pain x 3 days.  EKG  on admit concerning for STEMI.  Pharmacy was consulted to initiate heparin and warfarin after ECHO on 8/2 showed LV thrombus. DDI with Vascepa and warfarin noted of increased risk of bleed.  Baseline INR 1, aPTT 22, CBC stable.   8/3 PM update:  Heparin level just below goal after rate increase  Goal of Therapy:  INR 2-3  Heparin level 0.3-0.7 Monitor platelets by anticoagulation protocol: Yes   Plan:  Inc heparin to 1550 units/hr Re-check heparin level with AM labs Monitor daily INR, CBC, signs/symptoms of bleeding  Narda Bonds, PharmD, BCPS Clinical Pharmacist Phone: [phone]

## 2019-08-30 NOTE — TOC Initial Note (Signed)
Transition of Care [facility]) - Initial/Assessment Note    Patient Details  Name: Craig Flores MRN: [id number] Date of Birth: 03-20-1965  [facility] CM/SW Contact:    Curlene Labrum, RN Phone Number: 08/30/2019, 11:34 AM  Clinical Narrative:                 Case Management met with the patient regarding transitions of care and discharge planning once medical work up is complete.  I placed message to [facility] medical assistance to establish cost of Vascepa 2 grams po BID - since patient without insurance coverage.  The patient is currently unemployed as a Probation officer and lives at an apartment with his wife and son.  The patient drives but his wife and son do not.  The patient was set up with a follow up appointment with [place], where his wife is a patient as well.  Will continue to follow for discharge needs.  Expected Discharge Plan: Home/Self Care Barriers to Discharge: Continued Medical Work up   Patient Goals and CMS Choice Patient states their goals for this hospitalization and ongoing recovery are:: Plans to discharge to home. CMS Medicare.gov Compare Post Acute Care list provided to:: Patient Choice offered to / list presented to : Patient  Expected Discharge Plan and Services Expected Discharge Plan: Home/Self Care In-house Referral: PCP / [facility] Discharge Planning Services: CM Consult, [facility], Medication Assistance   Living arrangements for the past 2 months: Apartment                                      Prior Living Arrangements/Services Living arrangements for the past 2 months: Apartment Lives with:: Spouse, Minor Children Patient language and need for interpreter reviewed:: Yes Do you feel safe going back to the place where you live?: Yes      Need for Family Participation in Patient Care: Yes (Comment) Care giver support system in place?: Yes (comment)   Criminal Activity/Legal Involvement  Pertinent to Current Situation/Hospitalization: No - Comment as needed  Activities of Daily Living Home Assistive Devices/Equipment: None ADL Screening (condition at time of admission) Patient's cognitive ability adequate to safely complete daily activities?: Yes Is the patient deaf or have difficulty hearing?: No Does the patient have difficulty seeing, even when wearing glasses/contacts?: No Does the patient have difficulty concentrating, remembering, or making decisions?: No Patient able to express need for assistance with ADLs?: Yes Does the patient have difficulty dressing or bathing?: No Independently performs ADLs?: Yes (appropriate for developmental age) Does the patient have difficulty walking or climbing stairs?: No Weakness of Legs: None Weakness of Arms/Hands: None  Permission Sought/Granted Permission sought to share information with : Case Manager Permission granted to share information with : Yes, Verbal Permission Granted     Permission granted to share info w AGENCY: [employer] and  Permission granted to share info w Relationship: wife     Emotional Assessment Appearance:: Appears stated age Attitude/Demeanor/Rapport: Gracious Affect (typically observed): Accepting Orientation: : Oriented to Place, Oriented to Self, Oriented to  Time, Oriented to Situation Alcohol / Substance Use: Tobacco Use Psych Involvement: No (comment)  Admission diagnosis:  ST elevation myocardial infarction (STEMI), unspecified artery (HCC) [I21.3] Acute ST elevation myocardial infarction (STEMI) of anterior wall ([place]) [I21.09] Patient Active Problem List   Diagnosis Date Noted  . Acute ST elevation myocardial infarction (STEMI) of anterior  wall ([place]) 08/28/2019  . Major depressive disorder, recurrent, severe without psychotic features ([place])   . Alcohol abuse   . Alcohol dependence with withdrawal, uncomplicated ([place]) 47/42/5956  . Generalized anxiety disorder 04/26/2014  .  Suicidal ideations 04/26/2014  . Depression, major, single episode, severe ([place]) 04/26/2014  . Substance induced mood disorder ([place]) 04/26/2014   PCP:  Patient, No Pcp Per Pharmacy:   [place], [place] [address] Phone: [phone] Fax: [phone]  [facility] [place], [place] - [address] [place] [address] Phone: [phone] Fax: [phone]     Social Determinants of Health (SDOH) Interventions    Readmission Risk Interventions Readmission Risk Prevention Plan 08/30/2019  Post Dischage Appt Complete  Medication Screening Complete  Transportation Screening Complete  Some recent data might be hidden

## 2019-08-30 NOTE — Progress Notes (Signed)
ANTICOAGULATION CONSULT NOTE - Initial Consult  Pharmacy Consult for heparin and warfarin Indication: LV thrombus  No Known Allergies  Patient Measurements: Height: 5\' 9"  (175.3 cm) Weight: 83.9 kg (185 lb) IBW/kg (Calculated) : 70.7 Heparin Dosing Weight: 83.9 kg  Vital Signs: Temp: 98.4 F (36.9 C) (08/03 1100) Temp Source: Oral (08/03 1100) BP: 95/66 (08/03 1300) Pulse Rate: 98 (08/03 1300)  Labs: Recent Labs    08/28/19 1458 08/28/19 1458 08/28/19 1530 08/28/19 1856 08/29/19 0921 08/29/19 1223 08/30/19 0151 08/30/19 1345  HGB 17.0  --  16.3  --   --   --   --   --   HCT 50.2  --  48.0  --   --   --   --   --   PLT 304  --   --   --   --   --   --   --   APTT 22*  --   --   --   --   --   --   --   LABPROT 12.7  --   --   --   --   --   --   --   INR 1.0  --   --   --   --   --   --   --   HEPARINUNFRC  --   --   --   --   --   --   --  0.15*  CREATININE 1.10   < > 1.00  --  1.00  --  0.84  --   TROPONINIHS QUANTITY NOT SUFFICIENT, UNABLE TO PERFORM TEST  --   --  >27,000*  --  >27,000*  --   --    < > = values in this interval not displayed.    Estimated Creatinine Clearance: 100.5 mL/min (by C-G formula based on SCr of 0.84 mg/dL).   Medical History: Past Medical History:  Diagnosis Date  . Acid reflux   . Depression   . Migraines     Medications:  Scheduled:  . aspirin  81 mg Oral Daily  . atorvastatin  80 mg Oral Daily  . carvedilol  6.25 mg Oral BID WC  . Chlorhexidine Gluconate Cloth  6 each Topical Daily  . coumadin book   Does not apply Once  . icosapent Ethyl  2 g Oral BID  . losartan  12.5 mg Oral Daily  . mirtazapine  30 mg Oral QHS  . pantoprazole  40 mg Oral BID  . sertraline  150 mg Oral Daily  . sodium chloride flush  3 mL Intravenous Q12H  . warfarin  7.5 mg Oral ONCE-1600  . Warfarin - Pharmacist Dosing Inpatient   Does not apply q1600    Assessment: Craig Flores is a 54 YO male with no prior cardiac hx presented with  persistent chest pain x 3 days.  EKG on admit concerning for STEMI.  Pharmacy was consulted to initiate heparin and warfarin after ECHO on 8/2 showed LV thrombus. DDI with Vascepa and warfarin noted of increased risk of bleed.  Baseline INR 1, aPTT 22, CBC stable.   6 hour HL subtherapeutic at 0.15.  Verified with nursing staff no issues with line, no bleeding noted.  Will increase heparin rate to 1400 units/hr.  Patient will also receive warfarin 7.5mg  tonight.    Goal of Therapy:  INR 2-3  Heparin level 0.3-0.7 Monitor platelets by anticoagulation protocol: Yes   Plan:  Increase heparin  drip to 1400 units/hr Warfarin 7.5mg   Monitor daily INR, 6 hour HL, CBC, signs/symptoms of bleeding  Dimple Nanas, PharmD PGY-1 Acute Care Pharmacy Resident 08/30/2019 2:56 PM

## 2019-08-30 NOTE — Progress Notes (Signed)
Progress Note  Patient Name: Trestan Vahle Date of Encounter: 08/30/2019  Primary Cardiologist: Victory Dakin  Subjective   Mild pleuritic discomfort   Inpatient Medications    Scheduled Meds:  aspirin  81 mg Oral Daily   carvedilol  6.25 mg Oral BID WC   Chlorhexidine Gluconate Cloth  6 each Topical Daily   clopidogrel  75 mg Oral Q breakfast   heparin injection (subcutaneous)  5,000 Units Subcutaneous Q8H   mirtazapine  30 mg Oral QHS   pantoprazole  40 mg Oral BID   sertraline  150 mg Oral Daily   sodium chloride flush  3 mL Intravenous Q12H   Continuous Infusions:  sodium chloride     PRN Meds: sodium chloride, acetaminophen, fentaNYL (SUBLIMAZE) injection, morphine injection, ondansetron (ZOFRAN) IV, sodium chloride flush   Vital Signs    Vitals:   08/30/19 0400 08/30/19 0500 08/30/19 0600 08/30/19 0700  BP: 91/65 98/65 100/79 106/72  Pulse: (!) 51 (!) 106 (!) 115 (!) 57  Resp: (!) 27 (!) 27 (!) 29 (!) 22  Temp:      TempSrc:      SpO2: 91% 92% 94% 93%  Weight:      Height:        Intake/Output Summary (Last 24 hours) at 08/30/2019 0801 Last data filed at 08/30/2019 0600 Gross per 24 hour  Intake 483 ml  Output 375 ml  Net 108 ml    I/O since admission: +62  Filed Weights   08/28/19 1456  Weight: 83.9 kg    Telemetry    Sinus tachycardia at 108 with occasional PVC- Personally Reviewed  ECG    08/29/19 ECG (independently read by me): Sinus tachycardia at 114.  Persistent ST elevation V2 through V6 1, aVL, and lead II  08/28/19 ECG (independently read by me): NSR 92; QS V2-V6, I aVL and inferiorly  Physical Exam   BP 106/72    Pulse (!) 57    Temp 98.9 F (37.2 C) (Oral)    Resp (!) 22    Ht 5\' 9"  (1.753 m)    Wt 83.9 kg    SpO2 93%    BMI 27.32 kg/m  General: Alert, oriented, no distress.  Skin: normal turgor, no rashes, warm and dry HEENT: Normocephalic, atraumatic. Pupils equal round and reactive to light; sclera anicteric;  extraocular muscles intact;  Nose without nasal septal hypertrophy Mouth/Parynx benign; Mallinpatti scale 3 Neck: No JVD, no carotid bruits; normal carotid upstroke Lungs: clear to ausculatation and percussion; no wheezing or rales Chest wall: without tenderness to palpitation Heart: PMI not displaced, RRR, s1 s2 normal, 1/6 systolic murmur, no diastolic murmur, no rubs, gallops, thrills, or heaves Abdomen: soft, nontender; no hepatosplenomehaly, BS+; abdominal aorta nontender and not dilated by palpation. 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; no clubbing cyanosis or edema, Homan's sign negative  Neurologic: grossly nonfocal; Cranial nerves grossly wnl Psychologic: Normal mood and affect   Labs    Chemistry Recent Labs  Lab 08/28/19 1458 08/28/19 1458 08/28/19 1530 08/29/19 0921 08/30/19 0151  NA 133*   < > 136 132* 132*  K 4.4   < > 3.7 3.6 3.6  CL 98   < > 102 99 96*  CO2 18*  --   --  20* 23  GLUCOSE 111*   < > 117* 163* 106*  BUN  17   < > 19 16 11   CREATININE 1.10   < > 1.00 1.00 0.84  CALCIUM 9.6  --   --  9.3 8.9  PROT 7.5  --   --   --  6.3*  ALBUMIN 4.3  --   --   --  3.0*  AST 339*  --   --   --  94*  ALT 115*  --   --   --  58*  ALKPHOS 63  --   --   --  56  BILITOT 1.1  --   --   --  1.0  GFRNONAA >60  --   --  >60 >60  GFRAA >60  --   --  >60 >60  ANIONGAP 17*  --   --  13 13   < > = values in this interval not displayed.     Hematology Recent Labs  Lab 08/28/19 1458 08/28/19 1530  WBC 20.1*  --   RBC 5.56  --   HGB 17.0 16.3  HCT 50.2 48.0  MCV 90.3  --   MCH 30.6  --   MCHC 33.9  --   RDW 13.3  --   PLT 304  --    HS Trop > 27, 000  Cardiac EnzymesNo results for input(s): TROPONINI in the last 168 hours. No results for input(s): TROPIPOC in the last 168 hours.   BNPNo results for  input(s): BNP, PROBNP in the last 168 hours.   DDimer No results for input(s): DDIMER in the last 168 hours.   Lipid Panel     Component Value Date/Time   CHOL 338 (H) 08/28/2019 1458   TRIG 584 (H) 08/28/2019 1458   HDL 44 08/28/2019 1458   CHOLHDL 7.7 08/28/2019 1458   VLDL UNABLE TO CALCULATE IF TRIGLYCERIDE OVER 400 mg/dL 08/28/2019 1458   LDLCALC UNABLE TO CALCULATE IF TRIGLYCERIDE OVER 400 mg/dL 08/28/2019 1458   LDLDIRECT 133.9 (H) 08/28/2019 1458     Radiology    CARDIAC CATHETERIZATION  Result Date: 08/28/2019  Mid LAD lesion is 100% stenosed.  Prox RCA to Mid RCA lesion is 40% stenosed.  Kazden Largo is a 54 y.o. male  [id number] LOCATION:  FACILITY: [facility] PHYSICIAN: Quay Burow, M.D. [birth year] DATE OF PROCEDURE:  08/28/2019 DATE OF DISCHARGE: CARDIAC CATHETERIZATION History obtained from chart review.  54 year old mildly overweight married Caucasian male without prior cardiac history.  He is on no medications.  He has smoked for last 15 years.  He was not feeling well last week and developed chest pain that awakened him from sleep on Friday night.  The pain persisted until today when it was more pleuritic.  He went to urgent care where he was found to have anterolateral ST segment ovation with Q waves across his precordium and was transferred by EMS to [facility] for further evaluation.  He was Covid negative.   [name] Has is over 36 hours out from anterior STEMI with ongoing chest pain since Friday morning when he was awakened.  The pain is nonpleuritic.  His EKG shows Q waves across his precordium with persistent ST segment elevation.  His cath shows an occluded mid LAD without collaterals.  I suspect he has a completed infarct now is having post MI pericarditis.  There is no benefit for revascularization.  I did not do an LV gram since I am concerned that he may have an apical mural thrombus.  We will obtain a 2D  echo.  We talked about the importance of smoking  cessation.  We will start guideline directed optimal medical therapy including beta-blocker, ARB and high-dose statin blood pressure permitting.  I will also start Plavix and low-dose aspirin.  He does have mild to moderate segmental mid dominant RCA stenosis that does not appear to be hemodynamically significant.  I suspect that he will have severe LV dysfunction will probably benefit from wearing a LifeVest for 3 months prior to rechecking his LV function. Quay Burow. MD, [facility] 08/28/2019 3:53 PM   ECHOCARDIOGRAM COMPLETE  Result Date: 08/29/2019    ECHOCARDIOGRAM REPORT   Patient Name:   KASHTON RAY O'NEIL Date of Exam: 08/29/2019 Medical Rec #:  [id number]         Height:       69.0 in Accession #:    [id number]        Weight:       185.0 lb Date of Birth:  1965-05-04          BSA:          1.999 m Patient Age:    54 years          BP:           113/89 mmHg Patient Gender: M                 HR:           97 bpm. Exam Location:  Inpatient Procedure: 2D Echo, Cardiac Doppler, Color Doppler and Intracardiac            Opacification Agent Indications:    CAD Native Vessel  History:        Patient has no prior history of Echocardiogram examinations.                 Acute MI; Risk Factors:Current Smoker. ETOH abuse.  Sonographer:    Clayton Lefort RDCS (AE) Referring Phys: [id number] JONATHAN J BERRY IMPRESSIONS  1. LV apical thrombus present 2 x 1 cm. LV mid and apical akinesis with hyperdynamic base. Left ventricular ejection fraction, by estimation, is 45 to 50%. The left ventricle has mildly decreased function. The left ventricle demonstrates regional wall motion abnormalities (see scoring diagram/findings for description). Left ventricular diastolic parameters are indeterminate.  2. Right ventricular systolic function preserved at the base with mildly reduced apical function. The right ventricular size is normal. There is normal pulmonary artery systolic pressure.  3. The mitral valve is normal in structure. No evidence of  mitral valve regurgitation. No evidence of mitral stenosis.  4. Possible bicuspid aortic valve with calcified raphe between right and left coronary cusps. The aortic valve has an indeterminant number of cusps. Aortic valve regurgitation is not visualized. Mild to moderate aortic valve stenosis. Aortic valve mean gradient measures 19.0 mmHg.  5. Cannot exclude aortic coarctation based on 2-D images. Continuous wave Doppler in descending thoracic aorta suggests continuous flow, suggestive of possible flow limiting lesion, however baseline artifact limits this assessment.  6. The inferior vena cava is normal in size with <50% respiratory variability, suggesting right atrial pressure of 8 mmHg. Conclusion(s)/Recommendation(s): Consider CTA Heart and Chest with retrospective ECG gating and full cardiac cycle 0-95% to further assess aortic valve morphology and thoracic aorta. Alternatively, could consider TEE for AV morphology with CW Doppler of descending aorta. Critcal results of LV thrombus discussed and acknowledged by Dr. Claiborne Billings at 1300 on 08/29/19. FINDINGS  Left Ventricle: LV apical thrombus present 2 x 1 cm.  LV mid and apical akinesis with hyperdynamic base. Left ventricular ejection fraction, by estimation, is 45 to 50%. The left ventricle has mildly decreased function. The left ventricle demonstrates regional wall motion abnormalities. Definity contrast agent was given IV to delineate the left ventricular endocardial borders. The left ventricular internal cavity size was normal in size. There is no left ventricular hypertrophy. Left ventricular diastolic parameters are indeterminate.  LV Wall Scoring: The mid and distal lateral wall, mid and distal anterior septum, apical anterior segment, and apical inferior segment are akinetic. Right Ventricle: The right ventricular size is normal. No increase in right ventricular wall thickness. Right ventricular systolic function preserved at the base with mildly reduced  apical function. There is normal pulmonary artery systolic pressure. The  tricuspid regurgitant velocity is 2.45 m/s, and with an assumed right atrial pressure of 8 mmHg, the estimated right ventricular systolic pressure is 62.9 mmHg. Left Atrium: Left atrial size was normal in size. Right Atrium: Right atrial size was normal in size. Pericardium: There is no evidence of pericardial effusion. Mitral Valve: The mitral valve is normal in structure. Normal mobility of the mitral valve leaflets. No evidence of mitral valve regurgitation. No evidence of mitral valve stenosis. Tricuspid Valve: The tricuspid valve is normal in structure. Tricuspid valve regurgitation is trivial. No evidence of tricuspid stenosis. Aortic Valve: Possible bicuspid aortic valve with calcified raphe between right and left coronary cusps. The aortic valve has an indeterminant number of cusps. Aortic valve regurgitation is not visualized. Mild to moderate aortic stenosis is present. There is moderate calcification of the aortic valve. Aortic valve mean gradient measures 19.0 mmHg. Aortic valve peak gradient measures 30.7 mmHg. Aortic valve area, by VTI measures 1.31 cm. Pulmonic Valve: The pulmonic valve was normal in structure. Pulmonic valve regurgitation is trivial. No evidence of pulmonic stenosis. Aorta: Cannot exclude aortic coarctation based on 2-D images. Continuous wave Doppler in descending thoracic aorta suggests continuous flow, suggestive of possible flow limiting lesion, however baseline artifact limits this assessment. The aortic root is  normal in size and structure. Venous: The inferior vena cava is normal in size with less than 50% respiratory variability, suggesting right atrial pressure of 8 mmHg. IAS/Shunts: No atrial level shunt detected by color flow Doppler.  LEFT VENTRICLE PLAX 2D LVIDd:         3.60 cm LVIDs:         2.90 cm LV PW:         1.30 cm LV IVS:        1.30 cm LVOT diam:     2.30 cm LV SV:         56 LV SV  Index:   28 LVOT Area:     4.15 cm  LV Volumes (MOD) LV vol d, MOD A2C: 126.0 ml LV vol d, MOD A4C: 117.0 ml LV vol s, MOD A2C: 64.4 ml LV vol s, MOD A4C: 85.0 ml LV SV MOD A2C:     61.6 ml LV SV MOD A4C:     117.0 ml LV SV MOD BP:      48.7 ml RIGHT VENTRICLE            IVC RV Basal diam:  2.30 cm    IVC diam: 2.00 cm RV S prime:     9.90 cm/s TAPSE (M-mode): 2.3 cm LEFT ATRIUM             Index       RIGHT ATRIUM  Index LA diam:        2.00 cm 1.00 cm/m  RA Area:     9.94 cm LA Vol (A2C):   60.7 ml 30.37 ml/m RA Volume:   19.90 ml 9.96 ml/m LA Vol (A4C):   42.6 ml 21.31 ml/m LA Biplane Vol: 53.4 ml 26.72 ml/m  AORTIC VALVE AV Area (Vmax):    1.17 cm AV Area (Vmean):   1.17 cm AV Area (VTI):     1.31 cm AV Vmax:           277.00 cm/s AV Vmean:          211.000 cm/s AV VTI:            0.428 m AV Peak Grad:      30.7 mmHg AV Mean Grad:      19.0 mmHg LVOT Vmax:         78.30 cm/s LVOT Vmean:        59.300 cm/s LVOT VTI:          0.135 m LVOT/AV VTI ratio: 0.32  AORTA Ao Root diam: 3.80 cm Ao Asc diam:  3.70 cm TRICUSPID VALVE TR Peak grad:   24.0 mmHg TR Vmax:        245.00 cm/s  SHUNTS Systemic VTI:  0.14 m Systemic Diam: 2.30 cm Cherlynn Kaiser MD Electronically signed by Cherlynn Kaiser MD Signature Date/Time: 08/29/2019/1:02:56 PM    Final     Cardiac Studies     ECHO 08/28/2019 IMPRESSIONS  1. LV apical thrombus present 2 x 1 cm. LV mid and apical akinesis with  hyperdynamic base. Left ventricular ejection fraction, by estimation, is  45 to 50%. The left ventricle has mildly decreased function. The left  ventricle demonstrates regional wall  motion abnormalities (see scoring diagram/findings for description). Left  ventricular diastolic parameters are indeterminate.  2. Right ventricular systolic function preserved at the base with mildly  reduced apical function. The right ventricular size is normal. There is  normal pulmonary artery systolic pressure.  3. The mitral valve is  normal in structure. No evidence of mitral valve  regurgitation. No evidence of mitral stenosis.  4. Possible bicuspid aortic valve with calcified raphe between right and  left coronary cusps. The aortic valve has an indeterminant number of  cusps. Aortic valve regurgitation is not visualized. Mild to moderate  aortic valve stenosis. Aortic valve mean  gradient measures 19.0 mmHg.  5. Cannot exclude aortic coarctation based on 2-D images. Continuous wave  Doppler in descending thoracic aorta suggests continuous flow, suggestive  of possible flow limiting lesion, however baseline artifact limits this  assessment.  6. The inferior vena cava is normal in size with <50% respiratory  variability, suggesting right atrial pressure of 8 mmHg.   Conclusion(s)/Recommendation(s): Consider CTA Heart and Chest with  retrospective ECG gating and full cardiac cycle 0-95% to further assess  aortic valve morphology and thoracic aorta. Alternatively, could consider  TEE for AV morphology with CW Doppler of  descending aorta. Critcal results of LV thrombus discussed and  acknowledged by Dr. Claiborne Billings at 1300 on 08/29/19.   Patient Profile     Maddox Hlavaty is a 54 y.o. male with prior depression but no cardiac hx presented to [facility] with persistent chest pain since Friday night (08/26/19). EKG concerning for anterolateral STEMI.   Assessment & Plan    1. Delayed presentation Anterior STEMI: > 36 hours presentation with persistent STE and diffuse Q waves.Tro > 27,000.  ECG today continues to show significant persistent ST elevation in V6, 1, aVL, and lead II.  He continues to have sinus tachycardia, rate slightly improved with increase carvedilol regimen to 6.25 mg.  We will try to initiate low-dose losartan 12.5 mg today with current blood pressure 109/82.  He continues to experience some mild residual pleuritic chest pain.  Occasional PVCs remain.  2. Ischemic cardiomyopathy: Echo reveals  EF 45 to 50% but extensive wall motion abnormality involving the LAD territory with akinetic mid distal lateral wall, mid distal anterior septum, apical anterior and apical inferior segments.  There is evidence for a 2 x 1 cm apical thrombus.  Patient has high likelihood for LV future dilation with aneurysmal formation.  3.  Apical thrombus: We will initiate warfarin.  IV heparin has been started.  Since patient did not undergo intervention, will DC Plavix and continue warfarin/81 mg aspirin.  4.  Mild to moderate aortic stenosis noted on echo with mean gradient 19, peak gradient 31.  Cannot exclude bicuspid aortic valve with calcified raphae between right and left coronary cusp.  5. Marked hyperlipidemia: Chol 338; TG 584. Was started on atorvastatin ? Familial Hyperlipidemia.  Atorvastatin was held yesterday with significantly increased LFTs.  These have significantly improved today.  Will resume atorvastatin today.  We will also obtain fasting lipid level this morning from labs already drawn for reassessment of triglycerides which were greater than 500 but the patient was not fasting at the time of the initial laboratory.  6. Increased LFTs: AST 339; ALT 115; probably contributed by MI; AST significantly improved today at 94 and ALT 58.  Follow-up labs in a.m.  7. Tobacco abuse: 15 yr history was currently almost 1 ppd.  8. Depression: on sertraline and mirtazapine at home.  Keep in ICU today we will transfer to cardiac telemetry tomorrow.   Signed, Troy Sine, MD, [facility] 08/30/2019, 8:01 AM

## 2019-08-30 NOTE — Progress Notes (Signed)
ANTICOAGULATION CONSULT NOTE - Initial Consult  Pharmacy Consult for heparin and warfarin Indication: LV thrombus  No Known Allergies  Patient Measurements: Height: 5\' 9"  (175.3 cm) Weight: 83.9 kg (185 lb) IBW/kg (Calculated) : 70.7 Heparin Dosing Weight: 83.9 kg  Vital Signs: Temp: 98.2 F (36.8 C) (08/03 0700) Temp Source: Oral (08/03 0700) BP: 105/74 (08/03 0900) Pulse Rate: 105 (08/03 0900)  Labs: Recent Labs    08/28/19 1458 08/28/19 1458 08/28/19 1530 08/28/19 1856 08/29/19 0921 08/29/19 1223 08/30/19 0151  HGB 17.0  --  16.3  --   --   --   --   HCT 50.2  --  48.0  --   --   --   --   PLT 304  --   --   --   --   --   --   APTT 22*  --   --   --   --   --   --   LABPROT 12.7  --   --   --   --   --   --   INR 1.0  --   --   --   --   --   --   CREATININE 1.10   < > 1.00  --  1.00  --  0.84  TROPONINIHS QUANTITY NOT SUFFICIENT, UNABLE TO PERFORM TEST  --   --  >27,000*  --  >27,000*  --    < > = values in this interval not displayed.    Estimated Creatinine Clearance: 100.5 mL/min (by C-G formula based on SCr of 0.84 mg/dL).   Medical History: Past Medical History:  Diagnosis Date  . Acid reflux   . Depression   . Migraines     Medications:  Scheduled:  . aspirin  81 mg Oral Daily  . atorvastatin  80 mg Oral Daily  . carvedilol  6.25 mg Oral BID WC  . Chlorhexidine Gluconate Cloth  6 each Topical Daily  . icosapent Ethyl  2 g Oral BID  . losartan  12.5 mg Oral Daily  . mirtazapine  30 mg Oral QHS  . pantoprazole  40 mg Oral BID  . sertraline  150 mg Oral Daily  . sodium chloride flush  3 mL Intravenous Q12H    Assessment: Craig Flores is a 54 YO male with no prior cardiac hx presented with persistent chest pain x 3 days.  EKG on admit concerning for STEMI.  Pharmacy was consulted to initiate heparin and warfarin after ECHO on 8/2 showed LV thrombus.  Baseline INR 1, aPTT 22, CBC stable.   Will initiate heparin drip at DVT ppx dose with no  bolus given- rate at 1200 units/hr (14 units/kg/hr) and give warfarin 7.5mg  to start bridge.  DDI with Vascepa and warfarin noted of increased risk of bleed.  Goal of Therapy:  INR 2-3  Heparin level 0.3-0.7 Monitor platelets by anticoagulation protocol: Yes   Plan:  Start heparin infusion at 1200 units/hr  Warfarin 7.5mg   Monitor daily INR, 6 hour HL, CBC, signs/symptoms of bleeding  Dimple Nanas, PharmD PGY-1 Acute Care Pharmacy Resident 08/30/2019 10:54 AM

## 2019-08-30 NOTE — Progress Notes (Signed)
Pt c/o pain in left shoulder and neck area. Fentanyl given not much change. BP soft. Contacted [facility] with Cardiology to establish BP parameters to give PRN morphine. Will continue to monitor.

## 2019-08-30 NOTE — Progress Notes (Signed)
CARDIAC REHAB PHASE I   Offered to walk with pt. Pt states increased pain today. Pt states pain has also moved from diaphragm area to his L shoulder and L neck. Pt stating pain 6/10, worse with exertion. Heat pack given,  RN made aware. Pt denies questions or concerns regarding yesterdays education. Will hold ambulation at this time, will continue to follow.  [place], RN BSN 08/30/2019 1:38 PM

## 2019-08-30 NOTE — Progress Notes (Signed)
Chaplain responded to spiritual consult deliver and explain AD to pt.  Pt already had an AD and expressed understanding of the content.  Chaplain explained when pt had completed form and was ready for it to be notarized to alert his nurse who would alert Chaplain.  Pt denied needing anything further from [place].  De [place]rs Chaplain Resident

## 2019-08-31 ENCOUNTER — Encounter (HOSPITAL_COMMUNITY): Payer: Self-pay | Admitting: Cardiovascular Disease

## 2019-08-31 DIAGNOSIS — E782 Mixed hyperlipidemia: Secondary | ICD-10-CM

## 2019-08-31 LAB — COMPREHENSIVE METABOLIC PANEL
ALT: 58 U/L — ABNORMAL HIGH (ref 0–44)
AST: 72 U/L — ABNORMAL HIGH (ref 15–41)
Albumin: 2.9 g/dL — ABNORMAL LOW (ref 3.5–5.0)
Alkaline Phosphatase: 74 U/L (ref 38–126)
Anion gap: 13 (ref 5–15)
BUN: 16 mg/dL (ref 6–20)
CO2: 26 mmol/L (ref 22–32)
Calcium: 9.1 mg/dL (ref 8.9–10.3)
Chloride: 96 mmol/L — ABNORMAL LOW (ref 98–111)
Creatinine, Ser: 1.18 mg/dL (ref 0.61–1.24)
GFR calc Af Amer: >60 mL/min (ref >60)
GFR calc non Af Amer: >60 mL/min (ref >60)
Glucose, Bld: 108 mg/dL — ABNORMAL HIGH (ref 70–99)
Potassium: 3.7 mmol/L (ref 3.5–5.1)
Sodium: 135 mmol/L (ref 135–145)
Total Bilirubin: 0.5 mg/dL (ref 0.3–1.2)
Total Protein: 6.4 g/dL — ABNORMAL LOW (ref 6.5–8.1)

## 2019-08-31 LAB — PROTIME-INR
INR: 1.1 (ref 0.8–1.2)
Prothrombin Time: 13.6 seconds (ref 11.4–15.2)

## 2019-08-31 LAB — CBC
HCT: 38.4 % — ABNORMAL LOW (ref 39.0–52.0)
Hemoglobin: 12.6 g/dL — ABNORMAL LOW (ref 13.0–17.0)
MCH: 30.1 pg (ref 26.0–34.0)
MCHC: 32.8 g/dL (ref 30.0–36.0)
MCV: 91.9 fL (ref 80.0–100.0)
Platelets: 196 10*3/uL (ref 150–400)
RBC: 4.18 MIL/uL — ABNORMAL LOW (ref 4.22–5.81)
RDW: 13.3 % (ref 11.5–15.5)
WBC: 10 10*3/uL (ref 4.0–10.5)
nRBC: 0 % (ref 0.0–0.2)

## 2019-08-31 LAB — HEPARIN LEVEL (UNFRACTIONATED): Heparin Unfractionated: 0.34 IU/mL (ref 0.30–0.70)

## 2019-08-31 MED ORDER — WARFARIN SODIUM 5 MG PO TABS
5.0000 mg | ORAL_TABLET | Freq: Once | ORAL | Status: AC
  Administered 2019-08-31: 5 mg via ORAL
  Filled 2019-08-31: qty 1

## 2019-08-31 MED ORDER — CARVEDILOL 6.25 MG PO TABS
9.3750 mg | ORAL_TABLET | Freq: Two times a day (BID) | ORAL | Status: DC
  Administered 2019-08-31 – 2019-09-01 (×2): 9.375 mg via ORAL
  Filled 2019-08-31 (×4): qty 1

## 2019-08-31 NOTE — Discharge Instructions (Signed)

## 2019-08-31 NOTE — Progress Notes (Signed)
ANTICOAGULATION CONSULT NOTE - Initial Consult  Pharmacy Consult for heparin and warfarin Indication: LV thrombus  No Known Allergies  Patient Measurements: Height: 5\' 9"  (175.3 cm) Weight: 83.9 kg (185 lb) IBW/kg (Calculated) : 70.7 Heparin Dosing Weight: 83.9 kg  Vital Signs: Temp: 99 F (37.2 C) (08/04 0400) Temp Source: Oral (08/04 0400) BP: 100/80 (08/04 0700) Pulse Rate: 101 (08/04 0700)  Labs: Recent Labs    08/28/19 1458 08/28/19 1458 08/28/19 1530 08/28/19 1530 08/28/19 1856 08/29/19 0921 08/29/19 1223 08/30/19 0151 08/30/19 1345 08/30/19 2211 08/31/19 0122  HGB 17.0   < > 16.3  --   --   --   --   --   --   --  12.6*  HCT 50.2  --  48.0  --   --   --   --   --   --   --  38.4*  PLT 304  --   --   --   --   --   --   --   --   --  196  APTT 22*  --   --   --   --   --   --   --   --   --   --   LABPROT 12.7  --   --   --   --   --   --   --   --   --  13.6  INR 1.0  --   --   --   --   --   --   --   --   --  1.1  HEPARINUNFRC  --   --   --   --   --   --   --   --  0.15* 0.29* 0.34  CREATININE 1.10   < > 1.00   < >  --  1.00  --  0.84  --   --  1.18  TROPONINIHS QUANTITY NOT SUFFICIENT, UNABLE TO PERFORM TEST  --   --   --  >27,000*  --  >27,000*  --   --   --   --    < > = values in this interval not displayed.    Estimated Creatinine Clearance: 71.6 mL/min (by C-G formula based on SCr of 1.18 mg/dL).   Medical History: Past Medical History:  Diagnosis Date  . Acid reflux   . Depression   . Migraines     Medications:  Scheduled:  . aspirin  81 mg Oral Daily  . atorvastatin  80 mg Oral Daily  . carvedilol  9.375 mg Oral BID WC  . Chlorhexidine Gluconate Cloth  6 each Topical Daily  . icosapent Ethyl  2 g Oral BID  . losartan  12.5 mg Oral Daily  . mirtazapine  30 mg Oral QHS  . pantoprazole  40 mg Oral BID  . sertraline  150 mg Oral Daily  . sodium chloride flush  3 mL Intravenous Q12H  . Warfarin - Pharmacist Dosing Inpatient   Does not  apply q1600    Assessment: Craig Flores is a 54 YO male with no prior cardiac hx presented with persistent chest pain x 3 days.  EKG on admit concerning for STEMI.  Pharmacy was consulted to initiate heparin and warfarin after ECHO on 8/2 showed LV thrombus. DDI with Vascepa and warfarin noted of increased risk of bleed.  Baseline INR 1, aPTT 22, CBC stable.   Heparin drip increased to  1550 units/hr overnight after 6 hour HL just below goal.  Morning HL therapeutic at 0.34 after rate increased.  Anticipate HL will continue to be therapeutic.  Patient will receive warfarin 5mg  tonight. INR 1.1, CBC stable.  Goal of Therapy:  INR 2-3  Heparin level 0.3-0.7 Monitor platelets by anticoagulation protocol: Yes   Plan:  Continue heparin drip at 1550 units/hr Warfarin 5mg   Monitor daily INR, daily HL, CBC, signs/symptoms of bleeding  Dimple Nanas, PharmD PGY-1 Acute Care Pharmacy Resident 08/31/2019 10:38 AM

## 2019-08-31 NOTE — Progress Notes (Signed)
CARDIAC REHAB PHASE I   PRE:  Rate/Rhythm: 95 SR  BP:  Sitting: 98/75      SaO2: 96 RA  MODE:  Ambulation: 370 ft   POST:  Rate/Rhythm: 107 ST  BP:  Sitting: 102/72    SaO2: 94 RA   Pt ambulated 329ft in hallway handheld assist with slow, steady gait. Pt denies dizziness or SOB, does c/o some pain in L side of neck. Pt returned to recliner. Call bell and bedside table within reach. Encouraged continued ambulation. Will continue to follow.  [id number] Rufina Falco, RN BSN 08/31/2019 10:18 AM

## 2019-08-31 NOTE — Progress Notes (Addendum)
Progress Note  Patient Name: Craig Flores Date of Encounter: 08/31/2019  Primary Cardiologist: Victory Dakin  Subjective   Mild pleuritic discomfort   Inpatient Medications    Scheduled Meds: . aspirin  81 mg Oral Daily  . atorvastatin  80 mg Oral Daily  . carvedilol  6.25 mg Oral BID WC  . Chlorhexidine Gluconate Cloth  6 each Topical Daily  . icosapent Ethyl  2 g Oral BID  . losartan  12.5 mg Oral Daily  . mirtazapine  30 mg Oral QHS  . pantoprazole  40 mg Oral BID  . sertraline  150 mg Oral Daily  . sodium chloride flush  3 mL Intravenous Q12H  . Warfarin - Pharmacist Dosing Inpatient   Does not apply q1600   Continuous Infusions: . sodium chloride    . heparin 1,550 Units/hr (08/31/19 0400)   PRN Meds: sodium chloride, acetaminophen, fentaNYL (SUBLIMAZE) injection, morphine injection, ondansetron (ZOFRAN) IV, sodium chloride flush   Vital Signs    Vitals:   08/31/19 0400 08/31/19 0500 08/31/19 0600 08/31/19 0700  BP: 93/74 97/76 (!) 84/71 100/80  Pulse: 93 96 88 (!) 101  Resp: 15 (!) 21  (!) 28  Temp: 99 F (37.2 C)     TempSrc: Oral     SpO2: 94% 92% 93% 93%  Weight:      Height:        Intake/Output Summary (Last 24 hours) at 08/31/2019 0829 Last data filed at 08/31/2019 0400 Gross per 24 hour  Intake 259.04 ml  Output --  Net 259.04 ml    I/O since admission: +909  Filed Weights   08/28/19 1456  Weight: 83.9 kg    Telemetry    Sinus tachycardia at 108 with occasional PVC- Personally Reviewed  ECG    08/29/19 ECG (independently read by me): Sinus tachycardia at 114.  Persistent ST elevation V2 through V6 1, aVL, and lead II  08/28/19 ECG (independently read by me): NSR 92; QS V2-V6, I aVL and inferiorly  Physical Exam    BP 100/80   Pulse (!) 101   Temp 99 F (37.2 C) (Oral)   Resp (!) 28   Ht 5\' 9"  (1.753 m)   Wt 83.9 kg   SpO2 93%   BMI 27.32 kg/m  General: Alert, oriented, no distress.  Skin: normal turgor, no rashes, warm and  dry HEENT: Normocephalic, atraumatic. Pupils equal round and reactive to light; sclera anicteric; extraocular muscles intact; Nose without nasal septal hypertrophy Mouth/Parynx benign; Mallinpatti scale 3 Neck: No JVD, no carotid bruits; normal carotid upstroke Lungs: clear to ausculatation and percussion; no wheezing or rales Chest wall: without tenderness to palpitation Heart: PMI not displaced, RRR, s1 s2 normal, 2/6 systolic murmur left sternal border, no diastolic murmur, no rubs, gallops, thrills, or heaves Abdomen: soft, nontender; no hepatosplenomehaly, BS+; abdominal aorta nontender and not dilated by palpation. Back: no CVA tenderness Pulses 2+R radial site stable Musculoskeletal: full range of motion, normal strength, no joint deformities Extremities: no clubbing cyanosis or edema, Homan's sign negative  Neurologic: grossly nonfocal; Cranial nerves grossly wnl Psychologic: Normal mood and affect    Labs    Chemistry Recent Labs  Lab 08/28/19 1458 08/28/19 1530 08/29/19 0921 08/30/19 0151 08/31/19 0122  NA 133*   < > 132* 132* 135  K 4.4   < > 3.6 3.6 3.7  CL 98   < > 99 96* 96*  CO2 18*   < > 20* 23 26  GLUCOSE 111*   < > 163* 106* 108*  BUN 17   < > 16 11 16   CREATININE 1.10   < > 1.00 0.84 1.18  CALCIUM 9.6   < > 9.3 8.9 9.1  PROT 7.5  --   --  6.3* 6.4*  ALBUMIN 4.3  --   --  3.0* 2.9*  AST 339*  --   --  94* 72*  ALT 115*  --   --  58* 58*  ALKPHOS 63  --   --  56 74  BILITOT 1.1  --   --  1.0 0.5  GFRNONAA >60   < > >60 >60 >60  GFRAA >60   < > >60 >60 >60  ANIONGAP 17*   < > 13 13 13    < > = values in this interval not displayed.     Hematology Recent Labs  Lab 08/28/19 1458 08/28/19 1530 08/31/19 0122  WBC 20.1*  --  10.0  RBC 5.56  --  4.18*  HGB 17.0 16.3 12.6*  HCT 50.2 48.0 38.4*  MCV 90.3  --  91.9  MCH 30.6  --  30.1  MCHC 33.9  --  32.8  RDW 13.3  --  13.3  PLT 304  --  196   HS Trop > 27, 000  Cardiac EnzymesNo results for  input(s): TROPONINI in the last 168 hours. No results for input(s): TROPIPOC in the last 168 hours.   BNPNo results for input(s): BNP, PROBNP in the last 168 hours.   DDimer No results for input(s): DDIMER in the last 168 hours.   Lipid Panel     Component Value Date/Time   CHOL 236 (H) 08/30/2019 0821   TRIG 495 (H) 08/30/2019 0821   HDL 29 (L) 08/30/2019 0821   CHOLHDL 8.1 08/30/2019 0821   VLDL UNABLE TO CALCULATE IF TRIGLYCERIDE OVER 400 mg/dL 08/30/2019 0821   LDLCALC UNABLE TO CALCULATE IF TRIGLYCERIDE OVER 400 mg/dL 08/30/2019 0821   LDLDIRECT 99.9 (H) 08/30/2019 0821     Radiology    ECHOCARDIOGRAM COMPLETE  Result Date: 08/29/2019    ECHOCARDIOGRAM REPORT   Patient Name:   Craig Flores Date of Exam: 08/29/2019 Medical Rec #:  [id number]         Height:       69.0 in Accession #:    [id number]        Weight:       185.0 lb Date of Birth:  19-Sep-1965          BSA:          1.999 m Patient Age:    54 years          BP:           113/89 mmHg Patient Gender: M                 HR:           97 bpm. Exam Location:  Inpatient Procedure: 2D Echo, Cardiac Doppler, Color Doppler and Intracardiac            Opacification Agent Indications:    CAD Native Vessel  History:        Patient has no prior history of Echocardiogram examinations.                 Acute MI; Risk Factors:Current Smoker. ETOH abuse.  Sonographer:    Clayton Lefort RDCS (AE) Referring Phys: [place]  1. LV apical thrombus present 2 x 1 cm. LV mid and apical akinesis with hyperdynamic base. Left ventricular ejection fraction, by estimation, is 45 to 50%. The left ventricle has mildly decreased function. The left ventricle demonstrates regional wall motion abnormalities (see scoring diagram/findings for description). Left ventricular diastolic parameters are indeterminate.  2. Right ventricular systolic function preserved at the base with mildly reduced apical function. The right ventricular size is normal.  There is normal pulmonary artery systolic pressure.  3. The mitral valve is normal in structure. No evidence of mitral valve regurgitation. No evidence of mitral stenosis.  4. Possible bicuspid aortic valve with calcified raphe between right and left coronary cusps. The aortic valve has an indeterminant number of cusps. Aortic valve regurgitation is not visualized. Mild to moderate aortic valve stenosis. Aortic valve mean gradient measures 19.0 mmHg.  5. Cannot exclude aortic coarctation based on 2-D images. Continuous wave Doppler in descending thoracic aorta suggests continuous flow, suggestive of possible flow limiting lesion, however baseline artifact limits this assessment.  6. The inferior vena cava is normal in size with <50% respiratory variability, suggesting right atrial pressure of 8 mmHg. Conclusion(s)/Recommendation(s): Consider CTA Heart and Chest with retrospective ECG gating and full cardiac cycle 0-95% to further assess aortic valve morphology and thoracic aorta. Alternatively, could consider TEE for AV morphology with CW Doppler of descending aorta. Critcal results of LV thrombus discussed and acknowledged by Dr. Claiborne Billings at 1300 on 08/29/19. FINDINGS  Left Ventricle: LV apical thrombus present 2 x 1 cm. LV mid and apical akinesis with hyperdynamic base. Left ventricular ejection fraction, by estimation, is 45 to 50%. The left ventricle has mildly decreased function. The left ventricle demonstrates regional wall motion abnormalities. Definity contrast agent was given IV to delineate the left ventricular endocardial borders. The left ventricular internal cavity size was normal in size. There is no left ventricular hypertrophy. Left ventricular diastolic parameters are indeterminate.  LV Wall Scoring: The mid and distal lateral wall, mid and distal anterior septum, apical anterior segment, and apical inferior segment are akinetic. Right Ventricle: The right ventricular size is normal. No increase in right  ventricular wall thickness. Right ventricular systolic function preserved at the base with mildly reduced apical function. There is normal pulmonary artery systolic pressure. The  tricuspid regurgitant velocity is 2.45 m/s, and with an assumed right atrial pressure of 8 mmHg, the estimated right ventricular systolic pressure is 03.0 mmHg. Left Atrium: Left atrial size was normal in size. Right Atrium: Right atrial size was normal in size. Pericardium: There is no evidence of pericardial effusion. Mitral Valve: The mitral valve is normal in structure. Normal mobility of the mitral valve leaflets. No evidence of mitral valve regurgitation. No evidence of mitral valve stenosis. Tricuspid Valve: The tricuspid valve is normal in structure. Tricuspid valve regurgitation is trivial. No evidence of tricuspid stenosis. Aortic Valve: Possible bicuspid aortic valve with calcified raphe between right and left coronary cusps. The aortic valve has an indeterminant number of cusps. Aortic valve regurgitation is not visualized. Mild to moderate aortic stenosis is present. There is moderate calcification of the aortic valve. Aortic valve mean gradient measures 19.0 mmHg. Aortic valve peak gradient measures 30.7 mmHg. Aortic valve area, by VTI measures 1.31 cm. Pulmonic Valve: The pulmonic valve was normal in structure. Pulmonic valve regurgitation is trivial. No evidence of pulmonic stenosis. Aorta: Cannot exclude aortic coarctation based on 2-D images. Continuous wave Doppler in descending thoracic aorta suggests continuous flow, suggestive of possible flow limiting  lesion, however baseline artifact limits this assessment. The aortic root is  normal in size and structure. Venous: The inferior vena cava is normal in size with less than 50% respiratory variability, suggesting right atrial pressure of 8 mmHg. IAS/Shunts: No atrial level shunt detected by color flow Doppler.  LEFT VENTRICLE PLAX 2D LVIDd:         3.60 cm LVIDs:          2.90 cm LV PW:         1.30 cm LV IVS:        1.30 cm LVOT diam:     2.30 cm LV SV:         56 LV SV Index:   28 LVOT Area:     4.15 cm  LV Volumes (MOD) LV vol d, MOD A2C: 126.0 ml LV vol d, MOD A4C: 117.0 ml LV vol s, MOD A2C: 64.4 ml LV vol s, MOD A4C: 85.0 ml LV SV MOD A2C:     61.6 ml LV SV MOD A4C:     117.0 ml LV SV MOD BP:      48.7 ml RIGHT VENTRICLE            IVC RV Basal diam:  2.30 cm    IVC diam: 2.00 cm RV S prime:     9.90 cm/s TAPSE (M-mode): 2.3 cm LEFT ATRIUM             Index       RIGHT ATRIUM          Index LA diam:        2.00 cm 1.00 cm/m  RA Area:     9.94 cm LA Vol (A2C):   60.7 ml 30.37 ml/m RA Volume:   19.90 ml 9.96 ml/m LA Vol (A4C):   42.6 ml 21.31 ml/m LA Biplane Vol: 53.4 ml 26.72 ml/m  AORTIC VALVE AV Area (Vmax):    1.17 cm AV Area (Vmean):   1.17 cm AV Area (VTI):     1.31 cm AV Vmax:           277.00 cm/s AV Vmean:          211.000 cm/s AV VTI:            0.428 m AV Peak Grad:      30.7 mmHg AV Mean Grad:      19.0 mmHg LVOT Vmax:         78.30 cm/s LVOT Vmean:        59.300 cm/s LVOT VTI:          0.135 m LVOT/AV VTI ratio: 0.32  AORTA Ao Root diam: 3.80 cm Ao Asc diam:  3.70 cm TRICUSPID VALVE TR Peak grad:   24.0 mmHg TR Vmax:        245.00 cm/s  SHUNTS Systemic VTI:  0.14 m Systemic Diam: 2.30 cm Cherlynn Kaiser MD Electronically signed by Cherlynn Kaiser MD Signature Date/Time: 08/29/2019/1:02:56 PM    Final     Cardiac Studies     ECHO 08/28/2019 IMPRESSIONS  1. LV apical thrombus present 2 x 1 cm. LV mid and apical akinesis with  hyperdynamic base. Left ventricular ejection fraction, by estimation, is  45 to 50%. The left ventricle has mildly decreased function. The left  ventricle demonstrates regional wall  motion abnormalities (see scoring diagram/findings for description). Left  ventricular diastolic parameters are indeterminate.  2. Right ventricular systolic function preserved at the base with mildly  reduced  apical function. The right  ventricular size is normal. There is  normal pulmonary artery systolic pressure.  3. The mitral valve is normal in structure. No evidence of mitral valve  regurgitation. No evidence of mitral stenosis.  4. Possible bicuspid aortic valve with calcified raphe between right and  left coronary cusps. The aortic valve has an indeterminant number of  cusps. Aortic valve regurgitation is not visualized. Mild to moderate  aortic valve stenosis. Aortic valve mean  gradient measures 19.0 mmHg.  5. Cannot exclude aortic coarctation based on 2-D images. Continuous wave  Doppler in descending thoracic aorta suggests continuous flow, suggestive  of possible flow limiting lesion, however baseline artifact limits this  assessment.  6. The inferior vena cava is normal in size with <50% respiratory  variability, suggesting right atrial pressure of 8 mmHg.   Conclusion(s)/Recommendation(s): Consider CTA Heart and Chest with  retrospective ECG gating and full cardiac cycle 0-95% to further assess  aortic valve morphology and thoracic aorta. Alternatively, could consider  TEE for AV morphology with CW Doppler of  descending aorta. Critcal results of LV thrombus discussed and  acknowledged by Dr. Claiborne Billings at 1300 on 08/29/19.   Patient Profile     Aizik Reh is a 54 y.o. male with prior depression but no cardiac hx presented to [facility] with persistent chest pain since Friday night (08/26/19). EKG concerning for anterolateral STEMI.   Assessment & Plan    1. Delayed presentation Anterior STEMI: > 36 hours presentation with persistent STE and diffuse Q waves.Tro > 27,000.  ECG today continues to show significant persistent ST elevation in V6, 1, aVL, and lead II.  He continues to have sinus tachycardia, rate slightly improved with increase carvedilol regimen to 6.25 mg.   Low-dose losartan 12.5 mg was added yesterday.  Blood pressure today continues to be on the low side in the 062-6 05  systolic range.  Ventricular rate is improved but still in the upper 90s.  PVCs are less with carvedilol.  We will try to slightly titrate to 9.375 mg twice daily today and hopefully further increase to 12.5 twice daily as blood pressure and heart rate allow.  2.  He continues to experience some mild residual pleuritic chest pain.   2. Ischemic cardiomyopathy: Echo reveals EF 45 to 50% but extensive wall motion abnormality involving the LAD territory with akinetic mid distal lateral wall, mid distal anterior septum, apical anterior and apical inferior segments.  There is evidence for a 2 x 1 cm apical thrombus.  Patient has high likelihood for LV future dilation with aneurysmal formation.  Tolerating initiation of low-dose losartan.  Will further titrate as blood pressure allows with plan for probable transition to [facility].  3.  Apical thrombus: First dose of warfarin was August 3.  IV heparin was started.  Since patient did not undergo intervention,  Plavix was DCd and continue warfarin/81 mg aspirin. INR 1.1 today.  4.  Mild to moderate aortic stenosis noted on echo with mean gradient 19, peak gradient 31.  Cannot exclude bicuspid aortic valve with calcified raphae between right and left coronary cusp.  5. Marked hyperlipidemia: Chol 338; TG 584. Was started on atorvastatin ? Familial Hyperlipidemia.  Atorvastatin was held yesterday with significantly increased LFTs.  These have significantly improved today.  Will resume atorvastatin today.  We will also obtain fasting lipid level this morning from labs already drawn for reassessment of triglycerides which were greater than 500 but the patient was not  fasting at the time of the initial laboratory.  Follow-up lipid panel yesterday showed Strahl 236, HDL 29, LDL 99.9 and triglycerides 495.  Cipro was added yesterday 2 capsules twice daily.  6. Increased LFTs: AST 339; ALT 115; probably contributed by MI; AST significantly improved today at 94 and ALT 58.   Lab today shows AST 72 and ALT 58  7. Tobacco abuse: 15 yr history was currently almost 1 ppd.  8. Depression: on sertraline and mirtazapine at home.  We will have cardiac rehab begin trying to walk patient today.  If he remains stable will transfer to cardiac telemetry later this afternoon.  Signed, Troy Sine, MD, [facility] 08/31/2019, 8:29 AM

## 2019-09-01 ENCOUNTER — Inpatient Hospital Stay (HOSPITAL_COMMUNITY): Payer: Medicaid Other

## 2019-09-01 ENCOUNTER — Inpatient Hospital Stay: Payer: Self-pay

## 2019-09-01 DIAGNOSIS — I213 ST elevation (STEMI) myocardial infarction of unspecified site: Secondary | ICD-10-CM

## 2019-09-01 DIAGNOSIS — I350 Nonrheumatic aortic (valve) stenosis: Secondary | ICD-10-CM

## 2019-09-01 DIAGNOSIS — I5021 Acute systolic (congestive) heart failure: Secondary | ICD-10-CM

## 2019-09-01 DIAGNOSIS — R570 Cardiogenic shock: Secondary | ICD-10-CM

## 2019-09-01 DIAGNOSIS — I255 Ischemic cardiomyopathy: Secondary | ICD-10-CM

## 2019-09-01 DIAGNOSIS — E782 Mixed hyperlipidemia: Secondary | ICD-10-CM

## 2019-09-01 DIAGNOSIS — I513 Intracardiac thrombosis, not elsewhere classified: Secondary | ICD-10-CM

## 2019-09-01 LAB — CBC
HCT: 35.6 % — ABNORMAL LOW (ref 39.0–52.0)
Hemoglobin: 12.1 g/dL — ABNORMAL LOW (ref 13.0–17.0)
MCH: 30.9 pg (ref 26.0–34.0)
MCHC: 34 g/dL (ref 30.0–36.0)
MCV: 91 fL (ref 80.0–100.0)
Platelets: 212 10*3/uL (ref 150–400)
RBC: 3.91 MIL/uL — ABNORMAL LOW (ref 4.22–5.81)
RDW: 13.4 % (ref 11.5–15.5)
WBC: 9.1 10*3/uL (ref 4.0–10.5)
nRBC: 0 % (ref 0.0–0.2)

## 2019-09-01 LAB — BASIC METABOLIC PANEL
Anion gap: 13 (ref 5–15)
Anion gap: 11 (ref 5–15)
BUN: 14 mg/dL (ref 6–20)
BUN: 14 mg/dL (ref 6–20)
CO2: 22 mmol/L (ref 22–32)
CO2: 22 mmol/L (ref 22–32)
Calcium: 8.9 mg/dL (ref 8.9–10.3)
Calcium: 8.8 mg/dL — ABNORMAL LOW (ref 8.9–10.3)
Chloride: 101 mmol/L (ref 98–111)
Chloride: 101 mmol/L (ref 98–111)
Creatinine, Ser: 0.96 mg/dL (ref 0.61–1.24)
Creatinine, Ser: 0.88 mg/dL (ref 0.61–1.24)
GFR calc Af Amer: >60 mL/min (ref >60)
GFR calc Af Amer: >60 mL/min (ref >60)
GFR calc non Af Amer: >60 mL/min (ref >60)
GFR calc non Af Amer: >60 mL/min (ref >60)
Glucose, Bld: 106 mg/dL — ABNORMAL HIGH (ref 70–99)
Glucose, Bld: 115 mg/dL — ABNORMAL HIGH (ref 70–99)
Potassium: 3.8 mmol/L (ref 3.5–5.1)
Potassium: 3.9 mmol/L (ref 3.5–5.1)
Sodium: 136 mmol/L (ref 135–145)
Sodium: 134 mmol/L — ABNORMAL LOW (ref 135–145)

## 2019-09-01 LAB — ECHOCARDIOGRAM LIMITED
Area-P 1/2: 5.62 cm2
Height: 69 in
S' Lateral: 4.2 cm
Weight: 2960 oz

## 2019-09-01 LAB — COOXEMETRY PANEL
Carboxyhemoglobin: 1.3 % (ref 0.5–1.5)
Methemoglobin: 0.9 % (ref 0.0–1.5)
O2 Saturation: 92.9 %
Total hemoglobin: 12.5 g/dL (ref 12.0–16.0)

## 2019-09-01 LAB — PROTIME-INR
INR: 1.1 (ref 0.8–1.2)
Prothrombin Time: 13.9 seconds (ref 11.4–15.2)

## 2019-09-01 LAB — LACTIC ACID, PLASMA: Lactic Acid, Venous: 0.8 mmol/L (ref 0.5–1.9)

## 2019-09-01 LAB — HEPARIN LEVEL (UNFRACTIONATED)
Heparin Unfractionated: 0.2 IU/mL — ABNORMAL LOW (ref 0.30–0.70)
Heparin Unfractionated: 0.12 IU/mL — ABNORMAL LOW (ref 0.30–0.70)
Heparin Unfractionated: 0.21 IU/mL — ABNORMAL LOW (ref 0.30–0.70)

## 2019-09-01 LAB — MAGNESIUM: Magnesium: 2.1 mg/dL (ref 1.7–2.4)

## 2019-09-01 MED ORDER — FUROSEMIDE 10 MG/ML IJ SOLN: 40.0000 mg | Freq: Once | INTRAMUSCULAR | Status: DC

## 2019-09-01 MED ORDER — WARFARIN SODIUM 7.5 MG PO TABS
7.5000 mg | ORAL_TABLET | Freq: Once | ORAL | Status: AC
  Administered 2019-09-01: 7.5 mg via ORAL
  Filled 2019-09-01: qty 1

## 2019-09-01 MED ORDER — NOREPINEPHRINE 4 MG/250ML-% IV SOLN: 0.0000 ug/min | INTRAVENOUS | Status: DC

## 2019-09-01 MED ORDER — FUROSEMIDE 10 MG/ML IJ SOLN: 80.0000 mg | Freq: Once | INTRAMUSCULAR | Status: AC

## 2019-09-01 MED ORDER — POTASSIUM CHLORIDE CRYS ER 20 MEQ PO TBCR
40.0000 meq | EXTENDED_RELEASE_TABLET | Freq: Once | ORAL | Status: AC
  Administered 2019-09-01: 40 meq via ORAL
  Filled 2019-09-01: qty 2

## 2019-09-01 MED ORDER — LOSARTAN POTASSIUM 25 MG PO TABS
12.5000 mg | ORAL_TABLET | Freq: Two times a day (BID) | ORAL | Status: DC
  Administered 2019-09-01: 12.5 mg via ORAL
  Filled 2019-09-01: qty 1

## 2019-09-01 NOTE — Progress Notes (Signed)
  Echocardiogram 2D Echocardiogram has been performed.  Craig Flores 09/01/2019, 5:13 PM

## 2019-09-01 NOTE — Procedures (Addendum)
Central Venous Catheter Insertion Procedure Note  Craig Flores  [id number]  1965/11/08  Date:09/01/19  Time:7:06 PM   Provider Performing:Whitney Wanda Plump Rosana Hoes   Procedure: Insertion of Non-tunneled Central Venous [phone]) with US guidance ([id number])     Indication(s) Medication administration  Consent Risks of the procedure as well as the alternatives and risks of each were explained to the patient and/or caregiver.  Consent for the procedure was obtained and is signed in the bedside chart  Anesthesia Topical only with 1% lidocaine   Timeout Verified patient identification, verified procedure, site/side was marked, verified correct patient position, special equipment/implants available, medications/allergies/relevant history reviewed, required imaging and test results available.  Sterile Technique Maximal sterile technique including full sterile barrier drape, hand hygiene, sterile gown, sterile gloves, mask, hair covering, sterile ultrasound probe cover (if used).  Procedure Description Area of catheter insertion was cleaned with chlorhexidine and draped in sterile fashion.  With real-time ultrasound guidance a central venous catheter was placed into the right internal jugular vein. Nonpulsatile blood flow and easy flushing noted in all ports.  The catheter was sutured in place and sterile dressing applied.  Complications/Tolerance None; patient tolerated the procedure well. Chest X-ray is ordered to verify placement for internal jugular or subclavian cannulation.   Chest x-ray is not ordered for femoral cannulation.  EBL Minimal  Specimen(s) None  Johnsie Cancel, NP-C [facility] Contact / Pager information can be found on Amion  09/01/2019, 7:06 PM  ATTENDING ATTESTATION: I was present and immediately available for the entire duration of the procedure. I agree with the documentation in this note.  Lanier Clam, MD

## 2019-09-01 NOTE — Progress Notes (Signed)
ANTICOAGULATION CONSULT NOTE - Initial Consult  Pharmacy Consult for heparin and warfarin Indication: LV thrombus  No Known Allergies  Patient Measurements: Height: 5\' 9"  (175.3 cm) Weight: 83.9 kg (185 lb) IBW/kg (Calculated) : 70.7 Heparin Dosing Weight: 83.9 kg  Vital Signs: Temp: 98.3 F (36.8 C) (08/05 1500) Temp Source: Oral (08/05 1500) BP: 90/71 (08/05 1138) Pulse Rate: 96 (08/05 1500)  Labs: Recent Labs    08/30/19 0151 08/30/19 1345 08/31/19 0122 09/01/19 0703 09/01/19 1413  HGB  --   --  12.6* 12.1*  --   HCT  --   --  38.4* 35.6*  --   PLT  --   --  196 212  --   LABPROT  --   --  13.6 13.9  --   INR  --   --  1.1 1.1  --   HEPARINUNFRC  --    < > 0.34 0.20* 0.12*  CREATININE 0.84  --  1.18 0.96  --    < > = values in this interval not displayed.    Estimated Creatinine Clearance: 88 mL/min (by C-G formula based on SCr of 0.96 mg/dL).   Medical History: Past Medical History:  Diagnosis Date  . Acid reflux   . Depression   . Migraines     Medications:  Scheduled:  . aspirin  81 mg Oral Daily  . atorvastatin  80 mg Oral Daily  . carvedilol  9.375 mg Oral BID WC  . Chlorhexidine Gluconate Cloth  6 each Topical Daily  . icosapent Ethyl  2 g Oral BID  . losartan  12.5 mg Oral BID  . mirtazapine  30 mg Oral QHS  . pantoprazole  40 mg Oral BID  . sertraline  150 mg Oral Daily  . sodium chloride flush  3 mL Intravenous Q12H  . warfarin  7.5 mg Oral ONCE-1600  . Warfarin - Pharmacist Dosing Inpatient   Does not apply q1600    Assessment: Craig Flores is a 54 YO male with no prior cardiac hx presented with persistent chest pain x 3 days.  EKG on admit concerning for STEMI.  Pharmacy was consulted to initiate heparin and warfarin after ECHO on 8/2 showed LV thrombus. DDI with Vascepa and warfarin noted of increased risk of bleed.  Baseline INR 1, aPTT 22, CBC stable.   Heparin drip increased to 1850 units/hr after 6 hour HL subtherapeutic at  0.12, despite rate increase this morning.  Will check 6 hour HL. Confirmed with nurse no issues with line, no pause in drip running, no clotting. Recommend to check AT3 if still subtherapeutic after this increase.    INR remains at 1.1, CBC stable.  Will give warfarin 7.5mg  tonight.  Goal of Therapy:  INR 2-3  Heparin level 0.3-0.7 Monitor platelets by anticoagulation protocol: Yes   Plan:  Increase heparin drip to 1850 units/hr Warfarin 7.5mg   Monitor 6 hour HL Monitor daily INR, CBC, signs/symptoms of bleeding  Dimple Nanas, PharmD PGY-1 Acute Care Pharmacy Resident 09/01/2019 3:32 PM

## 2019-09-01 NOTE — Progress Notes (Signed)
Progress Note  Patient Name: Craig Flores Date of Encounter: 09/01/2019  Primary Cardiologist: Victory Dakin  Subjective   Mild pleuritic discomfort  improved  Inpatient Medications    Scheduled Meds: . aspirin  81 mg Oral Daily  . atorvastatin  80 mg Oral Daily  . carvedilol  9.375 mg Oral BID WC  . Chlorhexidine Gluconate Cloth  6 each Topical Daily  . icosapent Ethyl  2 g Oral BID  . losartan  12.5 mg Oral Daily  . mirtazapine  30 mg Oral QHS  . pantoprazole  40 mg Oral BID  . sertraline  150 mg Oral Daily  . sodium chloride flush  3 mL Intravenous Q12H  . warfarin  7.5 mg Oral ONCE-1600  . Warfarin - Pharmacist Dosing Inpatient   Does not apply q1600   Continuous Infusions: . sodium chloride    . heparin 1,550 Units/hr (09/01/19 0500)   PRN Meds: sodium chloride, acetaminophen, fentaNYL (SUBLIMAZE) injection, morphine injection, ondansetron (ZOFRAN) IV, sodium chloride flush   Vital Signs    Vitals:   09/01/19 0400 09/01/19 0500 09/01/19 0600 09/01/19 0700  BP: 91/68 (!) 89/73 90/71 97/76   Pulse: 98 94 97 100  Resp: (!) 32 (!) 23 (!) 29 (!) 31  Temp:      TempSrc:      SpO2: 91% 93% 94% 94%  Weight:      Height:        Intake/Output Summary (Last 24 hours) at 09/01/2019 7096 Last data filed at 09/01/2019 0500 Gross per 24 hour  Intake 325.45 ml  Output 725 ml  Net -399.55 ml    I/O since admission: +572  Filed Weights   08/28/19 1456  Weight: 83.9 kg    Telemetry    Sinus in th 90s, - Personally Reviewed  ECG    09/01/2019 ECG (independently read by me): Sinus at 101; 4 mm persistent STE across precordium and inferiorly  08/29/19 ECG (independently read by me): Sinus tachycardia at 114.  Persistent ST elevation V2 through V6 1, aVL, and lead II  08/28/19 ECG (independently read by me): NSR 92; QS V2-V6, I aVL and inferiorly  Physical Exam   BP 97/76   Pulse 100   Temp 99.4 F (37.4 C) (Oral)   Resp (!) 31   Ht 5\' 9"  (1.753 m)   Wt 83.9  kg   SpO2 94%   BMI 27.32 kg/m  General: Alert, oriented, no distress.  Skin: normal turgor, no rashes, warm and dry HEENT: Normocephalic, atraumatic. Pupils equal round and reactive to light; sclera anicteric; extraocular muscles intact;  Nose without nasal septal hypertrophy Mouth/Parynx benign; Mallinpatti scale 3 Neck: No JVD, no carotid bruits; normal carotid upstroke Lungs: clear to ausculatation and percussion; no wheezing or rales Chest wall: without tenderness to palpitation Heart: PMI not displaced, RRR, s1 s2 normal, 2/6 systolic murmur LSB, no diastolic murmur, no rubs, gallops, thrills, or heaves Abdomen: soft, nontender; no hepatosplenomehaly, BS+; abdominal aorta nontender and not dilated by palpation. Back: no CVA tenderness Pulses 2+ Musculoskeletal: full range of motion, normal strength, no joint deformities Extremities: congenital missing fingers L hand; no clubbing cyanosis or edema, Homan's sign negative  Neurologic: grossly nonfocal; Cranial nerves grossly wnl Psychologic: Normal mood and affect   Labs    Chemistry Recent Labs  Lab 08/28/19 1458 08/28/19 1530 08/30/19 0151 08/31/19 0122 09/01/19 0703  NA 133*   < > 132* 135 136  K 4.4   < > 3.6  3.7 3.8  CL 98   < > 96* 96* 101  CO2 18*   < > 23 26 22   GLUCOSE 111*   < > 106* 108* 106*  BUN 17   < > 11 16 14   CREATININE 1.10   < > 0.84 1.18 0.96  CALCIUM 9.6   < > 8.9 9.1 8.9  PROT 7.5  --  6.3* 6.4*  --   ALBUMIN 4.3  --  3.0* 2.9*  --   AST 339*  --  94* 72*  --   ALT 115*  --  58* 58*  --   ALKPHOS 63  --  56 74  --   BILITOT 1.1  --  1.0 0.5  --   GFRNONAA >60   < > >60 >60 >60  GFRAA >60   < > >60 >60 >60  ANIONGAP 17*   < > 13 13 13    < > = values in this interval not displayed.     Hematology Recent Labs  Lab 08/28/19 1458 08/28/19 1458 08/28/19 1530 08/31/19 0122 09/01/19 0703  WBC 20.1*  --   --  10.0 9.1  RBC 5.56  --   --  4.18* 3.91*  HGB 17.0   < > 16.3 12.6* 12.1*  HCT  50.2   < > 48.0 38.4* 35.6*  MCV 90.3  --   --  91.9 91.0  MCH 30.6  --   --  30.1 30.9  MCHC 33.9  --   --  32.8 34.0  RDW 13.3  --   --  13.3 13.4  PLT 304  --   --  196 212   < > = values in this interval not displayed.   HS Trop > 27, 000  Cardiac EnzymesNo results for input(s): TROPONINI in the last 168 hours. No results for input(s): TROPIPOC in the last 168 hours.   BNPNo results for input(s): BNP, PROBNP in the last 168 hours.   DDimer No results for input(s): DDIMER in the last 168 hours.   Lipid Panel     Component Value Date/Time   CHOL 236 (H) 08/30/2019 0821   TRIG 495 (H) 08/30/2019 0821   HDL 29 (L) 08/30/2019 0821   CHOLHDL 8.1 08/30/2019 0821   VLDL UNABLE TO CALCULATE IF TRIGLYCERIDE OVER 400 mg/dL 08/30/2019 0821   LDLCALC UNABLE TO CALCULATE IF TRIGLYCERIDE OVER 400 mg/dL 08/30/2019 0821   LDLDIRECT 99.9 (H) 08/30/2019 0821     Radiology    No results found.  Cardiac Studies     ECHO 08/28/2019 IMPRESSIONS  1. LV apical thrombus present 2 x 1 cm. LV mid and apical akinesis with  hyperdynamic base. Left ventricular ejection fraction, by estimation, is  45 to 50%. The left ventricle has mildly decreased function. The left  ventricle demonstrates regional wall  motion abnormalities (see scoring diagram/findings for description). Left  ventricular diastolic parameters are indeterminate.  2. Right ventricular systolic function preserved at the base with mildly  reduced apical function. The right ventricular size is normal. There is  normal pulmonary artery systolic pressure.  3. The mitral valve is normal in structure. No evidence of mitral valve  regurgitation. No evidence of mitral stenosis.  4. Possible bicuspid aortic valve with calcified raphe between right and  left coronary cusps. The aortic valve has an indeterminant number of  cusps. Aortic valve regurgitation is not visualized. Mild to moderate  aortic valve stenosis. Aortic valve mean    gradient  measures 19.0 mmHg.  5. Cannot exclude aortic coarctation based on 2-D images. Continuous wave  Doppler in descending thoracic aorta suggests continuous flow, suggestive  of possible flow limiting lesion, however baseline artifact limits this  assessment.  6. The inferior vena cava is normal in size with <50% respiratory  variability, suggesting right atrial pressure of 8 mmHg.   Conclusion(s)/Recommendation(s): Consider CTA Heart and Chest with  retrospective ECG gating and full cardiac cycle 0-95% to further assess  aortic valve morphology and thoracic aorta. Alternatively, could consider  TEE for AV morphology with CW Doppler of  descending aorta. Critcal results of LV thrombus discussed and  acknowledged by Dr. Claiborne Billings at 1300 on 08/29/19.   Patient Profile     Craig Flores is a 54 y.o. male with prior depression but no cardiac hx presented to [facility] with persistent chest pain since Friday night (08/26/19). EKG concerning for anterolateral STEMI.   Assessment & Plan    1. Delayed presentation Anterior STEMI: > 36 hours presentation with persistent STE and diffuse Q waves.Tro > 27,000.  ECG today continues to show significant persistent ST elevation in V6, 1, aVL, and lead II.  He continues to have sinus tachycardia, rate improved with carvediolol titrated now to 9.375 mg bid yesterday. PVCs improved, now very rare. Pleuritic chest pain improved. Losartan 12.5 mg was added 2 days ago; Persistent STE worrisome for aneurysmal development. Plan titrate coreg tomorrow if BP allows.  2. Ischemic cardiomyopathy: Echo reveals EF 45 to 50% but extensive wall motion abnormality involving the LAD territory with akinetic mid distal lateral wall, mid distal anterior septum, apical anterior and apical inferior segments.  There is evidence for a 2 x 1 cm apical thrombus.  Patient has high likelihood for LV future dilation with aneurysmal formation.  Tolerating initiation of  low-dose losartan.With BP relatatively stable will try to titrate to 12.5 mg bid today.   3.  Apical thrombus: First dose of warfarin was August 3. On  IV heparin.  Since patient did not undergo intervention,  Plavix was DCd and continue warfarin/81 mg aspirin. INR 1.1 today. Today will be day 3 of warfarin.   4.  Mild to moderate aortic stenosis noted on echo with mean gradient 19, peak gradient 31.  Cannot exclude bicuspid aortic valve with calcified raphae between right and left coronary cusp.  5. Marked hyperlipidemia: Chol 338; TG 584. Was started on atorvastatin ? Familial Hyperlipidemia.  Atorvastatin was held yesterday with significantly increased LFTs.  These have significantly improved today.  Will resume atorvastatin today.  We will also obtain fasting lipid level this morning from labs already drawn for reassessment of triglycerides which were greater than 500 but the patient was not fasting at the time of the initial laboratory.  Follow-up lipid panel yesterday showed TC 236, HDL 29, LDL 99.9 and triglycerides 495.  Vascepa was added  2 capsules twice daily.  6. Increased LFTs: AST 339; ALT 115; probably contributed by MI; AST significantly improved today at 94 and ALT 58. F/U AST 72 and ALT 58  7. Tobacco abuse: 15 yr history was currently almost 1 ppd.  8. Depression: on sertraline and mirtazapine at home.  Walked yesterday with cardiac rehab.  Will transfer to cardiac telemetry today.  Signed, Troy Sine, MD, [facility] 09/01/2019, 8:22 AM

## 2019-09-01 NOTE — Plan of Care (Signed)
Pt in [place] on heparin drip. Monitoring blood levels to determine therapeutic levels. Pt remains on room air, continues with ASA and coumadin. Pt working with cardiac rehab

## 2019-09-01 NOTE — Progress Notes (Signed)
Patient transferred in his bed to 2 heart room 15 report given to receiving Rn. Patient transferred to new bed without diffulculty. Family in attendance aware of transfer to 2 heart room 15. Patient is now stating getting more short of breath receiving nurse reported apply o2 on patient begin his new therapies.

## 2019-09-01 NOTE — Progress Notes (Signed)
ANTICOAGULATION CONSULT NOTE - Initial Consult  Pharmacy Consult for heparin and warfarin Indication: LV thrombus  No Known Allergies  Patient Measurements: Height: 5\' 9"  (175.3 cm) Weight: 83.9 kg (185 lb) IBW/kg (Calculated) : 70.7 Heparin Dosing Weight: 83.9 kg  Vital Signs: Temp: 99.4 F (37.4 C) (08/05 0354) Temp Source: Oral (08/05 0354) BP: 97/76 (08/05 0700) Pulse Rate: 100 (08/05 0700)  Labs: Recent Labs    08/29/19 0921 08/29/19 1223 08/30/19 0151 08/30/19 1345 08/30/19 2211 08/31/19 0122 09/01/19 0703  HGB  --   --   --   --   --  12.6* 12.1*  HCT  --   --   --   --   --  38.4* 35.6*  PLT  --   --   --   --   --  196 212  LABPROT  --   --   --   --   --  13.6 13.9  INR  --   --   --   --   --  1.1 1.1  HEPARINUNFRC  --   --   --    < > 0.29* 0.34 0.20*  CREATININE 1.00  --  0.84  --   --  1.18  --   TROPONINIHS  --  >27,000*  --   --   --   --   --    < > = values in this interval not displayed.    Estimated Creatinine Clearance: 71.6 mL/min (by C-G formula based on SCr of 1.18 mg/dL).   Medical History: Past Medical History:  Diagnosis Date  . Acid reflux   . Depression   . Migraines     Medications:  Scheduled:  . aspirin  81 mg Oral Daily  . atorvastatin  80 mg Oral Daily  . carvedilol  9.375 mg Oral BID WC  . Chlorhexidine Gluconate Cloth  6 each Topical Daily  . icosapent Ethyl  2 g Oral BID  . losartan  12.5 mg Oral Daily  . mirtazapine  30 mg Oral QHS  . pantoprazole  40 mg Oral BID  . sertraline  150 mg Oral Daily  . sodium chloride flush  3 mL Intravenous Q12H  . warfarin  7.5 mg Oral ONCE-1600  . Warfarin - Pharmacist Dosing Inpatient   Does not apply q1600    Assessment: Craig Flores is a 54 YO male with no prior cardiac hx presented with persistent chest pain x 3 days.  EKG on admit concerning for STEMI.  Pharmacy was consulted to initiate heparin and warfarin after ECHO on 8/2 showed LV thrombus. DDI with Vascepa and  warfarin noted of increased risk of bleed.  Baseline INR 1, aPTT 22, CBC stable.   Heparin drip increased to 1700 units/hr after daily HL subtherapeutic at 0.2.   Will check 6 hour HL.  INR remains at 1.1, CBC stable.  Will give warfarin 7.5mg  tonight.  Goal of Therapy:  INR 2-3  Heparin level 0.3-0.7 Monitor platelets by anticoagulation protocol: Yes   Plan:  Increase heparin drip to 1700 units/hr Warfarin 7.5mg   Monitor 6 hour HL Monitor daily INR, CBC, signs/symptoms of bleeding  Dimple Nanas, PharmD PGY-1 Acute Care Pharmacy Resident 09/01/2019 7:50 AM

## 2019-09-01 NOTE — Progress Notes (Addendum)
   CTSP due to SOB and chest tightness. He reports a short while ago after getting settled up in his new bed assignment he developed SOB while lying in bed, associated with some mild tightness across his chest. Vitals without marked change from prior - still with consistently soft BP in the 59D systolic, pulse ox 63% on RA, HR 90s NSR on telemetry. He sat up and was given 2mg  of morphine by nursing per PRNs. With these measures the chest tightness resolved and the SOB feels slightly improved. O2 sat 91% , has had intermittent borderline sats since admit. 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 EKGs this admission have all demonstrated persistent ST elevation due to late presenting MI. Reviewed with Dr. Radford Pax acutely. Will obtain stat portable CXR as well as stat echo to evaluate for any changes in LV function or acute valvular pathology. Will also add daily weights, strict I&Os for volume monitoring.  Addendum 5:16 PM: 2D echo has resulted, see below for Dr. Theodosia Blender assessment. CXR is concerning for CHF/volume overload with pulmonary edema. Upon reassessment patient shows increased WOB from just simple act of repositioning in bed. No CP but does report recurrence of increased SOB. Dr. Radford Pax discussed with Dr. Aundra Dubin. Per our discussion, will transfer back to CCU, plan 40mg  IV Lasix x 1 (BP >875 systolic presently), also give 59meq KCl x 1 now along with Lasix, place PICC, obtain CVP, co-ox and hold beta blocker given acute heart failure. 3E working on getting 2H bed.   Addendum 5:35 PM: spoke with PICC team, unable to place PICC tonight so have called PCCM and requested central line placement. They gave OK to continue Coumadin (INR 1.1 this AM). Dr. Aundra Dubin also recommends we do 80mg  IV Lasix instead of 40mg  and to start levophed - standard titration orders per Dr. Radford Pax. Dr.  Radford Pax is covering this evening and will also plan to sign out to PM coverage as well. CHF team will plan formal consult in AM. Melina Copa PA-C

## 2019-09-01 NOTE — Plan of Care (Signed)
  Problem: Education: Goal: Understanding of CV disease, CV risk reduction, and recovery process will improve Outcome: Progressing Goal: Individualized Educational Video(s) Outcome: Progressing   Problem: Cardiovascular: Goal: Ability to achieve and maintain adequate cardiovascular perfusion will improve Outcome: Progressing Goal: Vascular access site(s) Level 0-1 will be maintained Outcome: Progressing   Problem: Health Behavior/Discharge Planning: Goal: Ability to safely manage health-related needs after discharge will improve Outcome: Progressing   Problem: Education: Goal: Knowledge of General Education information will improve Description: Including pain rating scale, medication(s)/side effects and non-pharmacologic comfort measures Outcome: Progressing   Problem: Health Behavior/Discharge Planning: Goal: Ability to manage health-related needs will improve Outcome: Progressing   Problem: Clinical Measurements: Goal: Ability to maintain clinical measurements within normal limits will improve Outcome: Progressing Goal: Will remain free from infection Outcome: Progressing Goal: Diagnostic test results will improve Outcome: Progressing Goal: Respiratory complications will improve Outcome: Progressing Goal: Cardiovascular complication will be avoided Outcome: Progressing   Problem: Activity: Goal: Risk for activity intolerance will decrease Outcome: Progressing   Problem: Nutrition: Goal: Adequate nutrition will be maintained Outcome: Progressing   Problem: Coping: Goal: Level of anxiety will decrease Outcome: Progressing   Problem: Elimination: Goal: Will not experience complications related to bowel motility Outcome: Progressing Goal: Will not experience complications related to urinary retention Outcome: Progressing   Problem: Pain Managment: Goal: General experience of comfort will improve Outcome: Progressing   Problem: Safety: Goal: Ability to remain  free from injury will improve Outcome: Progressing   Problem: Skin Integrity: Goal: Risk for impaired skin integrity will decrease Outcome: Progressing

## 2019-09-01 NOTE — Progress Notes (Signed)
CARDIAC REHAB PHASE I   PRE:  Rate/Rhythm: 97 RA  BP:  Sitting: 104/71      SaO2: 92 RA  MODE:  Ambulation: 370 ft x2   POST:  Rate/Rhythm: 117 ST  BP:  Sitting: 117/85    SaO2: 97 RA  Pt ambulated 399ft in hallway standby assist with steady gait, pt used restroom ambulated another 370ft. Pt feels stronger today. Pain improved, denies SOB or dizziness. Encouraged continued ambulation and out of bed. Will continue to follow.  [id number] Rufina Falco, RN BSN 09/01/2019 9:07 AM

## 2019-09-01 NOTE — Progress Notes (Addendum)
ANTICOAGULATION CONSULT NOTE - Follow Up Consult  Pharmacy Consult for heparin Indication: LV thrombus  Labs: Recent Labs    08/31/19 0122 08/31/19 0122 09/01/19 0703 09/01/19 1413 09/01/19 2035 09/01/19 2220  HGB 12.6*  --  12.1*  --   --   --   HCT 38.4*  --  35.6*  --   --   --   PLT 196  --  212  --   --   --   LABPROT 13.6  --  13.9  --   --   --   INR 1.1  --  1.1  --   --   --   HEPARINUNFRC 0.34   < > 0.20* 0.12*  --  0.21*  CREATININE 1.18  --  0.96  --  0.88  --    < > = values in this interval not displayed.    Assessment: 54yo male subtherapeutic on heparin after rate change.  Goal of Therapy:  Heparin level 0.3-0.7 units/ml   Plan:  Will increase heparin gtt by 2 units/kg/hr to 2000 units/hr and check level in 6 hours.    Wynona Neat, PharmD, BCPS  09/01/2019,11:31 PM   Addendum: Heparin level remains below goal at 0.26. Will increase heparin gtt by 10% to 2200 units/hr and check level in 6hr. VB 09/02/2019 6:22 AM

## 2019-09-01 NOTE — Progress Notes (Signed)
Patient c/o shortness of breath despite vs and o2 sat within normal limits. He is on RA. RT in see patient reported he was ok to Probation officer. In to obtain vs they were normal for him. He reported anxiety did not have anything ordered but patient was c/o of ongoing chest pain so I gave him the Morphine IVP. Writer called on call PA she reported would come up and see patient.

## 2019-09-01 NOTE — Progress Notes (Signed)
Patient reports feels whole lot better the chest tightness had gone away. He did not c/o of this earlier but reports this now. Wife has left the beside as patient was sleeping when I arrived to the room.

## 2019-09-01 NOTE — Progress Notes (Signed)
PCXR completed patient denies any further chest pain,he denies shortness of breath or n/v this time.

## 2019-09-02 LAB — CBC
HCT: 36.6 % — ABNORMAL LOW (ref 39.0–52.0)
Hemoglobin: 11.9 g/dL — ABNORMAL LOW (ref 13.0–17.0)
MCH: 30.4 pg (ref 26.0–34.0)
MCHC: 32.5 g/dL (ref 30.0–36.0)
MCV: 93.6 fL (ref 80.0–100.0)
Platelets: 242 10*3/uL (ref 150–400)
RBC: 3.91 MIL/uL — ABNORMAL LOW (ref 4.22–5.81)
RDW: 13.4 % (ref 11.5–15.5)
WBC: 9.3 10*3/uL (ref 4.0–10.5)
nRBC: 0 % (ref 0.0–0.2)

## 2019-09-02 LAB — BASIC METABOLIC PANEL
Anion gap: 12 (ref 5–15)
BUN: 13 mg/dL (ref 6–20)
CO2: 23 mmol/L (ref 22–32)
Calcium: 8.9 mg/dL (ref 8.9–10.3)
Chloride: 102 mmol/L (ref 98–111)
Creatinine, Ser: 0.84 mg/dL (ref 0.61–1.24)
GFR calc Af Amer: >60 mL/min (ref >60)
GFR calc non Af Amer: >60 mL/min (ref >60)
Glucose, Bld: 106 mg/dL — ABNORMAL HIGH (ref 70–99)
Potassium: 4.2 mmol/L (ref 3.5–5.1)
Sodium: 137 mmol/L (ref 135–145)

## 2019-09-02 LAB — COOXEMETRY PANEL
Carboxyhemoglobin: 1.6 % — ABNORMAL HIGH (ref 0.5–1.5)
Methemoglobin: 1.1 % (ref 0.0–1.5)
O2 Saturation: 74.1 %
Total hemoglobin: 12.6 g/dL (ref 12.0–16.0)

## 2019-09-02 LAB — PROTIME-INR
INR: 1.2 (ref 0.8–1.2)
Prothrombin Time: 15.1 seconds (ref 11.4–15.2)

## 2019-09-02 LAB — LDL CHOLESTEROL, DIRECT: Direct LDL: 90.2 mg/dL (ref 0–99)

## 2019-09-02 LAB — HEPARIN LEVEL (UNFRACTIONATED)
Heparin Unfractionated: 0.26 IU/mL — ABNORMAL LOW (ref 0.30–0.70)
Heparin Unfractionated: 0.32 IU/mL (ref 0.30–0.70)

## 2019-09-02 LAB — BRAIN NATRIURETIC PEPTIDE: B Natriuretic Peptide: 1396.1 pg/mL — ABNORMAL HIGH (ref 0.0–100.0)

## 2019-09-02 MED ORDER — COLCHICINE 0.6 MG PO TABS
0.6000 mg | ORAL_TABLET | Freq: Every day | ORAL | Status: DC
  Administered 2019-09-02 – 2019-09-06 (×5): 0.6 mg via ORAL
  Filled 2019-09-02 (×5): qty 1

## 2019-09-02 MED ORDER — FUROSEMIDE 10 MG/ML IJ SOLN
40.0000 mg | Freq: Once | INTRAMUSCULAR | Status: AC
  Administered 2019-09-02: 40 mg via INTRAVENOUS
  Filled 2019-09-02: qty 4

## 2019-09-02 MED ORDER — WARFARIN SODIUM 5 MG PO TABS
10.0000 mg | ORAL_TABLET | Freq: Once | ORAL | Status: AC
  Administered 2019-09-02: 10 mg via ORAL
  Filled 2019-09-02: qty 2

## 2019-09-02 MED ORDER — DIGOXIN 125 MCG PO TABS
0.1250 mg | ORAL_TABLET | Freq: Every day | ORAL | Status: DC
  Administered 2019-09-02 – 2019-09-06 (×5): 0.125 mg via ORAL
  Filled 2019-09-02 (×5): qty 1

## 2019-09-02 MED ORDER — LOSARTAN POTASSIUM 25 MG PO TABS
12.5000 mg | ORAL_TABLET | Freq: Two times a day (BID) | ORAL | Status: DC
  Administered 2019-09-02 – 2019-09-06 (×8): 12.5 mg via ORAL
  Filled 2019-09-02 (×8): qty 1

## 2019-09-02 MED ORDER — SPIRONOLACTONE 12.5 MG HALF TABLET
12.5000 mg | ORAL_TABLET | Freq: Every day | ORAL | Status: DC
  Administered 2019-09-02 – 2019-09-06 (×5): 12.5 mg via ORAL
  Filled 2019-09-02 (×5): qty 1

## 2019-09-02 NOTE — Consult Note (Addendum)
Advanced Heart Failure Team Consult Note   Primary Physician: Patient, No Pcp Per PCP-Cardiologist:  No primary care provider on file.  Reason for Consultation: Acute Systolic Heart Failure   HPI:    Craig Flores is seen today for evaluation of acute systolic heart failure, 2/2 ICM s/p anterior STEMI, at the request of Dr. Radford Pax, Cardiology   54 y/o male smoker w/ no prior cardiac history, admitted on 8/1 for delayed presentation anterior STEMI. Had had >36 hr of chest pain prior to seeking medical attention. Pain progressed to more pleuritic like CP. Found to have anterior ST elevations w/ precordial Q waves on admit. Hs troponin >27,000. Urgent cardiac cath showed totally occluded mid LAD w/o collaterals and 40% mid RCA stenosis. It was suspected he had completed his infarct and residual pleuritic CP was post MI pericarditis. No intervention was performed. 2D echo demonstrated moderately reduced LVEF, 45-50%, + apical thrombus and moderate AS. MV normal. RV systolic function mildly reduced.   He was placed on medical management for CAD and systolic HF. He was started on ASA, atorvastatin, Losartan and Coreg. Coumadin started for apical thrombus.   Yesterday, he developed recurrent CP and worsening SOB/ orthopnea/PND, pulmonary edema and hypotension, concerning for developing cardiogenic shock. He was transferred back to CCU, given IV Lasix and started on low dose NE. Repeat Limited echo showed LVEF to be lower, 30-35%, w/ GIIIDD (restrictive), apical thrombus and moderate AS. MV normal. RV systolic function mildly reduced. No pericardial effusion. PCCM placed CVC. Co-ox 74% this am. BNP 1,396. Lactic acid level normal, 0.8.   He is feeling slightly better. No current dyspnea but still sleeping w/ head of bed elevated. Continues w/ pleuritic CP. BP improved but remains soft, SBPs low 100-110. SCr/K normal. INR 1.2. On IV heparin for bridge.    Diagnostic LHC 08/28/19  Coronary  Findings  Diagnostic Dominance: Right Left Anterior Descending  Mid LAD lesion is 100% stenosed.  Right Coronary Artery  Prox RCA to Mid RCA lesion is 40% stenosed.  Intervention  No interventions have been documented. Coronary Diagrams  Diagnostic Dominance: Right  Intervention   2D Echo 08/29/19 LV apical thrombus present 2 x 1 cm. LV mid and apical akinesis with hyperdynamic base. Left ventricular ejection fraction, by estimation, is 45 to 50%. The left ventricle has mildly decreased function. The left ventricle demonstrates regional wall motion abnormalities (see scoring diagram/findings for description). Left ventricular diastolic parameters are indeterminate. 2. Right ventricular systolic function preserved at the base with mildly reduced apical function. The right ventricular size is normal. There is normal pulmonary artery systolic pressure. 3. The mitral valve is normal in structure. No evidence of mitral valve regurgitation. No evidence of mitral stenosis. 4. Possible bicuspid aortic valve with calcified raphe between right and left coronary cusps. The aortic valve has an indeterminant number of cusps. Aortic valve regurgitation is not visualized. Mild to moderate aortic valve stenosis. Aortic valve mean gradient measures 19.0 mmHg. 5. Cannot exclude aortic coarctation based on 2-D images. Continuous wave Doppler in descending thoracic aorta suggests continuous flow, suggestive of possible flow limiting lesion, however baseline artifact limits this assessment. 6. The inferior vena cava is normal in size with <50% respiratory variability, suggesting right atrial pressure of 8 mmHg.  Repeat Limited Echo 09/01/19 Akinesis of the entire apex with overall moderate to severe LV dysfunction; restrictive filling; layered apical thrombus; elevated gradient across aortic valve suggests at least moderate AS (mean gradient 19 mmHg; not  well interrogated); trace AI. 2. Left  ventricular ejection fraction, by estimation, is 30 to 35%. The left ventricle has moderately decreased function. The left ventricle demonstrates regional wall motion abnormalities (see scoring diagram/findings for description). Left ventricular diastolic parameters are consistent with Grade III diastolic dysfunction (restrictive). 3. Right ventricular systolic function is mildly reduced. The right ventricular size is normal. 4. The mitral valve is normal in structure. Trivial mitral valve regurgitation. No evidence of mitral stenosis. 5. The aortic valve has an indeterminant number of cusps. Aortic valve regurgitation is trivial. Moderate aortic valve stenosis. 6. The inferior vena cava is normal in size with greater than 50% respiratory variability, suggesting right atrial pressure of 3 mmHg.  Review of Systems: [y] = yes, [ ]  = no   . General: Weight gain [ ] ; Weight loss [ ] ; Anorexia [ ] ; Fatigue [ ] ; Fever [ ] ; Chills [ ] ; Weakness [ ]   . Cardiac: Chest pain/pressure [ Y]; Resting SOB [Y ]; Exertional SOB [ ] ; Orthopnea [Y ]; Pedal Edema [ ] ; Palpitations [ ] ; Syncope [ ] ; Presyncope [ ] ; Paroxysmal nocturnal dyspnea[Y ]  . Pulmonary: Cough [ ] ; Wheezing[ ] ; Hemoptysis[ ] ; Sputum [ ] ; Snoring [ ]   . GI: Vomiting[ ] ; Dysphagia[ ] ; Melena[ ] ; Hematochezia [ ] ; Heartburn[ ] ; Abdominal pain [ ] ; Constipation [ ] ; Diarrhea [ ] ; BRBPR [ ]   . GU: Hematuria[ ] ; Dysuria [ ] ; Nocturia[ ]   . Vascular: Pain in legs with walking [ ] ; Pain in feet with lying flat [ ] ; Non-healing sores [ ] ; Stroke [ ] ; TIA [ ] ; Slurred speech [ ] ;  . Neuro: Headaches[ ] ; Vertigo[ ] ; Seizures[ ] ; Paresthesias[ ] ;Blurred vision [ ] ; Diplopia [ ] ; Vision changes [ ]   . Ortho/Skin: Arthritis [ ] ; Joint pain [ ] ; Muscle pain [ ] ; Joint swelling [ ] ; Back Pain [ ] ; Rash [ ]   . Psych: Depression[ ] ; Anxiety[ ]   . Heme: Bleeding problems [ ] ; Clotting disorders [ ] ; Anemia [ ]   . Endocrine: Diabetes [ ] ; Thyroid dysfunction[  ]  Home Medications Prior to Admission medications   Medication Sig Start Date End Date Taking? Authorizing Provider  mirtazapine (REMERON) 30 MG tablet Take 1 tablet (30 mg total) by mouth at bedtime. 05/02/14  Yes Niel Hummer, NP  Multiple Vitamin (MULTIVITAMIN WITH MINERALS) TABS tablet Take 1 tablet by mouth daily. 05/02/14  Yes Niel Hummer, NP  omeprazole (PRILOSEC OTC) 20 MG tablet Take 1 tablet (20 mg total) by mouth 2 (two) times daily. 05/02/14  Yes Niel Hummer, NP  sertraline (ZOLOFT) 100 MG tablet Take 1 tablet (100 mg total) by mouth daily. Patient taking differently: Take 150 mg by mouth daily.  05/02/14  Yes Niel Hummer, NP    Past Medical History: Past Medical History:  Diagnosis Date  . Acid reflux   . Depression   . Migraines     Past Surgical History: Past Surgical History:  Procedure Laterality Date  . CORONARY/GRAFT ACUTE MI REVASCULARIZATION N/A 08/28/2019   Procedure: Coronary/Graft Acute MI Revascularization;  Surgeon: [name], MD;  Location: [facility];  Service: Cardiovascular;  Laterality: N/A;  . LEFT HEART CATH AND CORONARY ANGIOGRAPHY N/A 08/28/2019   Procedure: LEFT HEART CATH AND CORONARY ANGIOGRAPHY;  Surgeon: [name], MD;  Location: [facility];  Service: Cardiovascular;  Laterality: N/A;    Family History: Family History  Problem Relation Age of Onset  . Arrhythmia Father  Social History: Social History   Socioeconomic History  . Marital status: Married    Spouse name: Not on file  . Number of children: Not on file  . Years of education: Not on file  . Highest education level: Not on file  Occupational History  . Not on file  Tobacco Use  . Smoking status: Current Every Day Smoker    Types: Cigarettes  . Tobacco comment: x15 yrs as of 2021  Substance and Sexual Activity  . Alcohol use: Not Currently    Comment: previous use but has not drank in months  . Drug use: Not on file  . Sexual  activity: Yes    Birth control/protection: None  Other Topics Concern  . Not on file  Social History Narrative  . Not on file   Social Determinants of Health   Financial Resource Strain:   . Difficulty of Paying Living Expenses:   Food Insecurity:   . Worried About Charity fundraiser in the Last Year:   . Arboriculturist in the Last Year:   Transportation Needs:   . Film/video editor (Medical):   Marland Kitchen Lack of Transportation (Non-Medical):   Physical Activity:   . Days of Exercise per Week:   . Minutes of Exercise per Session:   Stress:   . Feeling of Stress :   Social Connections:   . Frequency of Communication with Friends and Family:   . Frequency of Social Gatherings with Friends and Family:   . Attends Religious Services:   . Active Member of Clubs or Organizations:   . Attends Archivist Meetings:   Marland Kitchen Marital Status:     Allergies:  No Known Allergies  Objective:    Vital Signs:   Temp:  [97.8 F (36.6 C)-100 F (37.8 C)] 98.3 F (36.8 C) (08/06 0400) Pulse Rate:  [32-108] 93 (08/06 0700) Resp:  [17-34] 20 (08/06 0700) BP: (90-116)/(62-88) 102/84 (08/06 0700) SpO2:  [89 %-99 %] 93 % (08/06 0700) Weight:  [84.5 kg] 84.5 kg (08/06 0552) Last BM Date: 09/01/19  Weight change: Filed Weights   08/28/19 1456 09/02/19 0552  Weight: 83.9 kg 84.5 kg    Intake/Output:   Intake/Output Summary (Last 24 hours) at 09/02/2019 0713 Last data filed at 09/02/2019 0700 Gross per 24 hour  Intake 1304.36 ml  Output 1775 ml  Net -470.64 ml      Physical Exam    CVP 8 General:  Well appearing. No resp difficulty HEENT: normal Neck: supple. JVP ~8 cm, + Rt IJ CVC . Carotids 2+ bilat; no bruits. No lymphadenopathy or thyromegaly appreciated. Cor: PMI nondisplaced. Regular rate & rhythm. No rubs, gallops or murmurs. Lungs: clear Abdomen: soft, nontender, nondistended. No hepatosplenomegaly. No bruits or masses. Good bowel sounds. Extremities: no cyanosis,  clubbing, rash, edema, deformity of left hand  Neuro: alert & orientedx3, cranial nerves grossly intact. moves all 4 extremities w/o difficulty. Affect pleasant   Telemetry   NSR 90s   EKG    Sinus tach + persistent anterolateral ST elevations 09/01/19  Labs   Basic Metabolic Panel: Recent Labs  Lab 08/30/19 0151 08/30/19 0151 08/31/19 0122 08/31/19 0122 09/01/19 0703 09/01/19 2035 09/02/19 0537  NA 132*  --  135  --  136 134* 137  K 3.6  --  3.7  --  3.8 3.9 4.2  CL 96*  --  96*  --  101 101 102  CO2 23  --  26  --  22 22 23   GLUCOSE 106*  --  108*  --  106* 115* 106*  BUN 11  --  16  --  14 14 13   CREATININE 0.84  --  1.18  --  0.96 0.88 0.84  CALCIUM 8.9   < > 9.1   < > 8.9 8.8* 8.9  MG  --   --   --   --   --  2.1  --    < > = values in this interval not displayed.    Liver Function Tests: Recent Labs  Lab 08/28/19 1458 08/30/19 0151 08/31/19 0122  AST 339* 94* 72*  ALT 115* 58* 58*  ALKPHOS 63 56 74  BILITOT 1.1 1.0 0.5  PROT 7.5 6.3* 6.4*  ALBUMIN 4.3 3.0* 2.9*   No results for input(s): LIPASE, AMYLASE in the last 168 hours. No results for input(s): AMMONIA in the last 168 hours.  CBC: Recent Labs  Lab 08/28/19 1458 08/28/19 1530 08/31/19 0122 09/01/19 0703 09/02/19 0537  WBC 20.1*  --  10.0 9.1 9.3  NEUTROABS 15.8*  --   --   --   --   HGB 17.0 16.3 12.6* 12.1* 11.9*  HCT 50.2 48.0 38.4* 35.6* 36.6*  MCV 90.3  --  91.9 91.0 93.6  PLT 304  --  196 212 242    Cardiac Enzymes: No results for input(s): CKTOTAL, CKMB, CKMBINDEX, TROPONINI in the last 168 hours.  BNP: BNP (last 3 results) Recent Labs    09/02/19 0537  BNP 1,396.1*    ProBNP (last 3 results) No results for input(s): PROBNP in the last 8760 hours.   CBG: No results for input(s): GLUCAP in the last 168 hours.  Coagulation Studies: Recent Labs    08/31/19 0122 09/01/19 0703 09/02/19 0537  LABPROT 13.6 13.9 15.1  INR 1.1 1.1 1.2     Imaging   DG Chest 1  View  Result Date: 09/01/2019 CLINICAL DATA:  Central venous catheter EXAM: CHEST  1 VIEW COMPARISON:  09/01/2019 FINDINGS: Tip of the right internal jugular vein approach central venous catheter is in the proximal right atrium. Unchanged bilateral interstitial opacities. Mild cardiomegaly. No pneumothorax. IMPRESSION: Right internal jugular vein approach central venous catheter tip in the proximal right atrium. Electronically Signed   By: Ulyses Jarred M.D.   On: 09/01/2019 19:34   DG CHEST PORT 1 VIEW  Result Date: 09/01/2019 CLINICAL DATA:  Shortness of breath, denies chest pain or cough EXAM: PORTABLE CHEST 1 VIEW COMPARISON:  None. FINDINGS: Diffuse basilar and perihilar predominant hazy and interstitial opacities with extensive peripheral septal thickening, fissural thickening. Cephalization and indistinctness of the pulmonary vascularity is present as is an enlarged cardiac silhouette accounting for the portable technique. No focal consolidation. No pneumothorax or effusion though the left costophrenic sulcus is partially collimated. The aorta is calcified. The remaining cardiomediastinal contours are unremarkable. No acute osseous or soft tissue abnormality. Telemetry leads overlie the chest. IMPRESSION: Findings are most consistent with CHF/volume overload with cardiomegaly and pulmonary edema. Electronically Signed   By: Lovena Le M.D.   On: 09/01/2019 16:00   ECHOCARDIOGRAM LIMITED  Result Date: 09/01/2019    ECHOCARDIOGRAM REPORT   Patient Name:   KADENCE RAY O'NEIL Date of Exam: 09/01/2019 Medical Rec #:  [id number]         Height:       69.0 in Accession #:    [id number]        Weight:  185.0 lb Date of Birth:  Nov 15, 1965          BSA:          1.999 m Patient Age:    49 years          BP:           106/77 mmHg Patient Gender: M                 HR:           104 bpm. Exam Location:  Inpatient Procedure: Limited Echo, Color Doppler and Cardiac Doppler STAT ECHO Indications:    Dyspnea. STEMI.   History:        Patient has prior history of Echocardiogram examinations, most                 recent 08/29/2019. Cardiomyopathy; Risk Factors:Dyslipidemia.  Sonographer:    Johny Chess Referring Phys: [id number] [place]  1. Akinesis of the entire apex with overall moderate to severe LV dysfunction; restrictive filling; layered apical thrombus; elevated gradient across aortic valve suggests at least moderate AS (mean gradient 19 mmHg; not well interrogated); trace AI.  2. Left ventricular ejection fraction, by estimation, is 30 to 35%. The left ventricle has moderately decreased function. The left ventricle demonstrates regional wall motion abnormalities (see scoring diagram/findings for description). Left ventricular  diastolic parameters are consistent with Grade III diastolic dysfunction (restrictive).  3. Right ventricular systolic function is mildly reduced. The right ventricular size is normal.  4. The mitral valve is normal in structure. Trivial mitral valve regurgitation. No evidence of mitral stenosis.  5. The aortic valve has an indeterminant number of cusps. Aortic valve regurgitation is trivial. Moderate aortic valve stenosis.  6. The inferior vena cava is normal in size with greater than 50% respiratory variability, suggesting right atrial pressure of 3 mmHg. FINDINGS  Left Ventricle: Left ventricular ejection fraction, by estimation, is 30 to 35%. The left ventricle has moderately decreased function. The left ventricle demonstrates regional wall motion abnormalities. The left ventricular internal cavity size was normal in size. There is no left ventricular hypertrophy. Left ventricular diastolic parameters are consistent with Grade III diastolic dysfunction (restrictive). Right Ventricle: The right ventricular size is normal. Right vetricular wall thickness was not assessed. Right ventricular systolic function is mildly reduced. Left Atrium: Left atrial size was normal in size. Right  Atrium: Right atrial size was normal in size. Pericardium: There is no evidence of pericardial effusion. Mitral Valve: The mitral valve is normal in structure. Normal mobility of the mitral valve leaflets. Trivial mitral valve regurgitation. No evidence of mitral valve stenosis. Tricuspid Valve: The tricuspid valve is normal in structure. Tricuspid valve regurgitation is trivial. No evidence of tricuspid stenosis. Aortic Valve: The aortic valve has an indeterminant number of cusps. Aortic valve regurgitation is trivial. Moderate aortic stenosis is present. Pulmonic Valve: The pulmonic valve was not well visualized. Pulmonic valve regurgitation is not visualized. No evidence of pulmonic stenosis. Aorta: The aortic root is normal in size and structure. Venous: The inferior vena cava is normal in size with greater than 50% respiratory variability, suggesting right atrial pressure of 3 mmHg.  Additional Comments: Akinesis of the entire apex with overall moderate to severe LV dysfunction; restrictive filling; layered apical thrombus; elevated gradient across aortic valve suggests at least moderate AS (mean gradient 19 mmHg; not well interrogated); trace AI.  LEFT VENTRICLE PLAX 2D LVIDd:         5.10 cm  Diastology LVIDs:         4.20 cm  LV e' lateral:   9.79 cm/s LV PW:         1.00 cm  LV E/e' lateral: 11.7 LV IVS:        1.10 cm  LV e' medial:    7.18 cm/s LVOT diam:     2.20 cm  LV E/e' medial:  16.0 LV SV:         49 LV SV Index:   25 LVOT Area:     3.80 cm  LEFT ATRIUM         Index LA diam:    4.00 cm 2.00 cm/m  AORTIC VALVE LVOT Vmax:   75.00 cm/s LVOT Vmean:  53.400 cm/s LVOT VTI:    0.129 m MITRAL VALVE MV Area (PHT): 5.62 cm     SHUNTS MV Decel Time: 135 msec     Systemic VTI:  0.13 m MV E velocity: 115.00 cm/s  Systemic Diam: 2.20 cm MV A velocity: 52.00 cm/s MV E/A ratio:  2.21 Kirk Ruths MD Electronically signed by Kirk Ruths MD Signature Date/Time: 09/01/2019/6:16:10 PM    Final    [place] EKG SITE  RITE  Result Date: 09/01/2019 If Site Rite image not attached, placement could not be confirmed due to current cardiac rhythm.     Medications:     Current Medications: . aspirin  81 mg Oral Daily  . atorvastatin  80 mg Oral Daily  . Chlorhexidine Gluconate Cloth  6 each Topical Daily  . furosemide  80 mg Intravenous Once  . icosapent Ethyl  2 g Oral BID  . mirtazapine  30 mg Oral QHS  . pantoprazole  40 mg Oral BID  . sertraline  150 mg Oral Daily  . sodium chloride flush  3 mL Intravenous Q12H  . Warfarin - Pharmacist Dosing Inpatient   Does not apply q1600     Infusions: . sodium chloride    . heparin 2,200 Units/hr (09/02/19 0700)  . norepinephrine (LEVOPHED) Adult infusion       Assessment/Plan   1. CAD w/ Late Presentation Anterior MI: - presented w/ >36hr CP. EKG w/ anterior STE + precordial Q waves. Hs troponin >27,000. - LHC 8/1 w/ 100% occluded mid LAD w/o collaterals (completed infarct) + 40% mid RCA stenosis. No PCI - Medical management w/ ASA + high intensity statin.  -  blocker discontinued yesterday given concerns for developing CGS. Continue to hold for now.  - start spiro today w/ LV dysfunction   2. Acute Systolic Heart Failure/ Ischemic CM - Initial Echo 8/2 w/ mildly reduced LVEF 45-50%, post anterior MI - concern for developing CGS on 8/5 - repeat limited Echo 8/5, EF lower 30-35%. MV ok. RV ok  - Co-ox 74%. LA normal - CXR 8/5 w/ pulmonary edema. BNP 1,396. Given IV Lasix, -1.7L in UOP yesterday  - CVP 8 today. SCr/K ok  - Give additional 40 mg IV Lasix now  - add spironolactone 12.5 mg daily  - BP too soft for ARB/ARNi - Continue to hold  blocker - eventual SGLT2i   3. LV Thrombus - Continue coumadin + heparin bridge - INR 1.2 today   4. HLD - TG 584, unable to calculate LDL - Continue Vascepa 2 gm bid (new) - Continue atorvastatin 80 mg qhs (new) - repeat FLP and HFTs in 6-8 weeks - LDL goal < 70   5. ? Post MI Pericarditis -  continues w/  pleuritic CP  - Echo 8/5 w/o pleural effusion  - consider adding colchicine   6. Tobacco Abuse - smoking cessation imperative    Length of Stay: [address] PA-C  09/02/2019, 7:13 AM  Advanced Heart Failure Team Pager [phone] (M-F; 7a - 4p)  Please contact [facility] for night-coverage after hours (4p -7a ) and weekends on [email or web address]  Patient seen with PA, agree with the above note.   54 yo smoker was admitted about 2 days after the onset of acute anterior MI.  Cath showed occluded LAD.  He had pleuritic pain, though to be post-MI pericarditis rather than ischemic pain.  No intervention given late presentation and no ischemic-type pain.  Echo was done and reviewed, EF about 30% with peri-apical akinesis and large LV thrombus.  Mildly decreased RV systolic function with akinetic RV apex.  He is stable today.  Still with some pleuritic CP but mild.  Heparin gtt running. Co-ox 74% with CVP 5.   General: NAD Neck: No JVD, no thyromegaly or thyroid nodule.  Lungs: Clear to auscultation bilaterally with normal respiratory effort. CV: Nondisplaced PMI.  Heart regular S1/S2, no S3/S4, no murmur.  No peripheral edema.   Abdomen: Soft, nontender, no hepatosplenomegaly, no distention.  Skin: Intact without lesions or rashes.  Neurologic: Alert and oriented x 3.  Psych: Normal affect. Extremities: No clubbing or cyanosis.  HEENT: Normal.    1. CAD: Acute anterior MI, delayed presentation with no ischemic pain, had pleuritic-type chest pain. LHC with totally occluded mid LAD.  Suspected completed MI, no intervention.   - Continue ASA 81.  - Heparin gtt with bridge to therapeutic warfarin with LV thrombus.  - Atorvastatin 80 mg daily.  2. Chest pain: Patient has mild pleuritic chest pain.  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  - Colchicine 0.6 mg bid.  3. LV thrombus: Large LV thrombus.   - Heparin bridge to therapeutic INR on warfarin. 4. Acute on chronic systolic CHF:  Echo with EF 30%, peri-apical akinesis, mildly decreased RV function with akinetic RV apex, moderate AS.  There was concern for cardiogenic shock but he has stabilized.  Co-ox 74% with CVP 4-5.  - Start digoxin 0.125 daily.  - Start losartan 12.5 mg bid.  - spironolactone 12.5 daily.  - No Lasix at this time.  5. Active smoker: Needs to quit.   Loralie Champagne 09/02/2019 3:44 PM

## 2019-09-02 NOTE — Progress Notes (Addendum)
ANTICOAGULATION CONSULT NOTE - Follow Up Consult  Pharmacy Consult for heparin Indication: LV thrombus  Labs: Recent Labs    08/31/19 0122 08/31/19 0122 09/01/19 0703 09/01/19 0703 09/01/19 1413 09/01/19 2035 09/01/19 2220 09/02/19 0537  HGB 12.6*   < > 12.1*  --   --   --   --  11.9*  HCT 38.4*  --  35.6*  --   --   --   --  36.6*  PLT 196  --  212  --   --   --   --  242  LABPROT 13.6  --  13.9  --   --   --   --  15.1  INR 1.1  --  1.1  --   --   --   --  1.2  HEPARINUNFRC 0.34   < > 0.20*   < > 0.12*  --  0.21* 0.26*  CREATININE 1.18   < > 0.96  --   --  0.88  --  0.84   < > = values in this interval not displayed.    Assessment: Craig Flores is a 54 YO male with no prior cardiac hx presented with persistent chest pain x 3 days.  EKG on admit concerning for STEMI.  Pharmacy was consulted to initiate heparin and warfarin after ECHO on 8/2 showed LV thrombus. DDI with Vascepa and warfarin noted of increased risk of bleed.  Heparin was increased to 2200 units/hr overnight and patient had central line placed.  Confirmed with nurse that heparin is running peripherally and HL will be drawn from central line.   6 hour heparin level therapeutic at 0.32.  Due to past heparin levels downtrending, will increase heparin drip to 2300 units/hr to ensure patient remains therapeutic.  INR 1.2, CBC stable. Will give warfarin 10mg  tonight for more aggressive management.   Goal of Therapy:  Heparin level 0.3-0.7 units/ml   Plan:  Increase heparin to 2300 units/hr Monitor daily HL Monitor CBC   Dimple Nanas, PharmD PGY-1 Acute Care Pharmacy Resident 09/02/2019 1:10 PM

## 2019-09-02 NOTE — Progress Notes (Signed)
Uneventful overnight per night shift RN report.  0800 Wife and daughter visiting at bedside; no questions at this time.  1000 Good UOP after lasix  1300 C/o mild HA; given tylenol  1600 Education provided on new meds; all questions answered.  1700 Walked 2 laps only requiring help with cord management  1800 Pt up to chair and assisted in bathing/grooming. Endorses feeling better after walking and washing.

## 2019-09-02 NOTE — Progress Notes (Signed)
NCM received consult: check benefits for Lovaza 2g BID. Pt with health insurance , no Rx coverage. Per pharmacy Lovaza , generic, is $ 25 .00 / 120 capsules.  NCM will continue to monitor.Marland KitchenMarland KitchenMarland KitchenPt will probably need Match Letter to assist with  Rx med cost @ d/c. Whitman Hero RN,BSN,CM

## 2019-09-03 ENCOUNTER — Encounter (HOSPITAL_COMMUNITY): Payer: Self-pay | Admitting: Cardiovascular Disease

## 2019-09-03 LAB — CBC
HCT: 35.8 % — ABNORMAL LOW (ref 39.0–52.0)
Hemoglobin: 11.8 g/dL — ABNORMAL LOW (ref 13.0–17.0)
MCH: 30.2 pg (ref 26.0–34.0)
MCHC: 33 g/dL (ref 30.0–36.0)
MCV: 91.6 fL (ref 80.0–100.0)
Platelets: 279 10*3/uL (ref 150–400)
RBC: 3.91 MIL/uL — ABNORMAL LOW (ref 4.22–5.81)
RDW: 13.2 % (ref 11.5–15.5)
WBC: 9.9 10*3/uL (ref 4.0–10.5)
nRBC: 0 % (ref 0.0–0.2)

## 2019-09-03 LAB — PROTIME-INR
INR: 1.4 — ABNORMAL HIGH (ref 0.8–1.2)
Prothrombin Time: 16.6 seconds — ABNORMAL HIGH (ref 11.4–15.2)

## 2019-09-03 LAB — HEPARIN LEVEL (UNFRACTIONATED): Heparin Unfractionated: 0.59 IU/mL (ref 0.30–0.70)

## 2019-09-03 LAB — BASIC METABOLIC PANEL
Anion gap: 13 (ref 5–15)
BUN: 14 mg/dL (ref 6–20)
CO2: 24 mmol/L (ref 22–32)
Calcium: 9.2 mg/dL (ref 8.9–10.3)
Chloride: 100 mmol/L (ref 98–111)
Creatinine, Ser: 0.93 mg/dL (ref 0.61–1.24)
GFR calc Af Amer: >60 mL/min (ref >60)
GFR calc non Af Amer: >60 mL/min (ref >60)
Glucose, Bld: 103 mg/dL — ABNORMAL HIGH (ref 70–99)
Potassium: 3.7 mmol/L (ref 3.5–5.1)
Sodium: 137 mmol/L (ref 135–145)

## 2019-09-03 LAB — COOXEMETRY PANEL
Carboxyhemoglobin: 1.5 % (ref 0.5–1.5)
Methemoglobin: 1 % (ref 0.0–1.5)
O2 Saturation: 67.3 %
Total hemoglobin: 14.5 g/dL (ref 12.0–16.0)

## 2019-09-03 MED ORDER — CARVEDILOL 3.125 MG PO TABS
3.1250 mg | ORAL_TABLET | Freq: Two times a day (BID) | ORAL | Status: DC
  Administered 2019-09-03 – 2019-09-06 (×7): 3.125 mg via ORAL
  Filled 2019-09-03 (×7): qty 1

## 2019-09-03 MED ORDER — WARFARIN SODIUM 10 MG PO TABS
10.0000 mg | ORAL_TABLET | Freq: Once | ORAL | Status: AC
  Administered 2019-09-03: 10 mg via ORAL
  Filled 2019-09-03: qty 1

## 2019-09-03 NOTE — Progress Notes (Signed)
ANTICOAGULATION CONSULT NOTE - Follow Up Consult  Pharmacy Consult for heparin, warfarin Indication: LV thrombus  Labs: Recent Labs    09/01/19 0703 09/01/19 1413 09/01/19 2035 09/01/19 2220 09/02/19 0537 09/02/19 1221 09/03/19 0434  HGB 12.1*  --   --   --  11.9*  --  11.8*  HCT 35.6*  --   --   --  36.6*  --  35.8*  PLT 212  --   --   --  242  --  279  LABPROT 13.9  --   --   --  15.1  --  16.6*  INR 1.1  --   --   --  1.2  --  1.4*  HEPARINUNFRC 0.20*   < >  --    < > 0.26* 0.32 0.59  CREATININE 0.96  --  0.88  --  0.84  --  0.93   < > = values in this interval not displayed.    Assessment: Craig Flores is a 54 YO male with no prior cardiac hx presented with persistent chest pain x 3 days.  EKG on admit concerning for STEMI.  Pharmacy was consulted to initiate heparin and warfarin after ECHO on 8/2 showed LV thrombus.  -heparin level= 0.59, INR= 1.4 (trend up; warfarin dose increased 8/6)  Goal of Therapy:  Heparin level 0.3-0.7 units/ml   Plan:  No heparin changes needed Warfarin 10mg  po today Daily PT/INR Monitor daily HL and CBC   Hildred Laser, PharmD Clinical Pharmacist **Pharmacist phone directory can now be found on [email or web address] (PW [email or web address]).  Listed under [place].

## 2019-09-03 NOTE — Progress Notes (Signed)
Patient ID: Gabriela Giannelli, male   DOB: [birth year], 54 y.o.   MRN: [id number]     Advanced Heart Failure Rounding Note  PCP-Cardiologist: No primary care provider on file.   Subjective:    Mild chest pain with deep breathing, otherwise no problems.  No dyspnea.    CVP 4-5, co-ox 67%. SBP 90s-100s.    Objective:   Weight Range: 81.1 kg Body mass index is 26.4 kg/m.   Vital Signs:   Temp:  [98.3 F (36.8 C)-101.3 F (38.5 C)] 98.3 F (36.8 C) (08/07 0740) Pulse Rate:  [74-101] 86 (08/07 0943) Resp:  [16-34] 23 (08/07 0700) BP: (85-113)/(61-88) 105/79 (08/07 0700) SpO2:  [87 %-94 %] 93 % (08/07 0700) Weight:  [81.1 kg] 81.1 kg (08/07 0600) Last BM Date: 09/02/19  Weight change: Filed Weights   09/02/19 0500 09/02/19 0552 09/03/19 0600  Weight: 84.5 kg 84.5 kg 81.1 kg    Intake/Output:   Intake/Output Summary (Last 24 hours) at 09/03/2019 0959 Last data filed at 09/03/2019 0700 Gross per 24 hour  Intake 1136.39 ml  Output 1575 ml  Net -438.61 ml      Physical Exam    General:  Well appearing. No resp difficulty HEENT: Normal Neck: Supple. JVP not elevated. Carotids 2+ bilat; no bruits. No lymphadenopathy or thyromegaly appreciated. Cor: PMI nondisplaced. Regular rate & rhythm. No rubs, gallops or murmurs. Lungs: Clear Abdomen: Soft, nontender, nondistended. No hepatosplenomegaly. No bruits or masses. Good bowel sounds. Extremities: No cyanosis, clubbing, rash, edema Neuro: Alert & orientedx3, cranial nerves grossly intact. moves all 4 extremities w/o difficulty. Affect pleasant   Telemetry   NSR 80s (personally reviewed)  Labs    CBC Recent Labs    09/02/19 0537 09/03/19 0434  WBC 9.3 9.9  HGB 11.9* 11.8*  HCT 36.6* 35.8*  MCV 93.6 91.6  PLT 242 242   Basic Metabolic Panel Recent Labs    09/01/19 2035 09/01/19 2035 09/02/19 0537 09/03/19 0434  NA 134*   < > 137 137  K 3.9   < > 4.2 3.7  CL 101   < > 102 100  CO2 22   < > 23 24  GLUCOSE  115*   < > 106* 103*  BUN 14   < > 13 14  CREATININE 0.88   < > 0.84 0.93  CALCIUM 8.8*   < > 8.9 9.2  MG 2.1  --   --   --    < > = values in this interval not displayed.   Liver Function Tests No results for input(s): AST, ALT, ALKPHOS, BILITOT, PROT, ALBUMIN in the last 72 hours. No results for input(s): LIPASE, AMYLASE in the last 72 hours. Cardiac Enzymes No results for input(s): CKTOTAL, CKMB, CKMBINDEX, TROPONINI in the last 72 hours.  BNP: BNP (last 3 results) Recent Labs    09/02/19 0537  BNP 1,396.1*    ProBNP (last 3 results) No results for input(s): PROBNP in the last 8760 hours.   D-Dimer No results for input(s): DDIMER in the last 72 hours. Hemoglobin A1C No results for input(s): HGBA1C in the last 72 hours. Fasting Lipid Panel Recent Labs    09/02/19 0537  LDLDIRECT 90.2   Thyroid Function Tests No results for input(s): TSH, T4TOTAL, T3FREE, THYROIDAB in the last 72 hours.  Invalid input(s): FREET3  Other results:   Imaging     No results found.   Medications:     Scheduled Medications: . aspirin  81 mg  Oral Daily  . atorvastatin  80 mg Oral Daily  . carvedilol  3.125 mg Oral BID WC  . Chlorhexidine Gluconate Cloth  6 each Topical Daily  . colchicine  0.6 mg Oral Daily  . digoxin  0.125 mg Oral Daily  . icosapent Ethyl  2 g Oral BID  . losartan  12.5 mg Oral BID  . mirtazapine  30 mg Oral QHS  . pantoprazole  40 mg Oral BID  . sertraline  150 mg Oral Daily  . sodium chloride flush  3 mL Intravenous Q12H  . spironolactone  12.5 mg Oral Daily  . Warfarin - Pharmacist Dosing Inpatient   Does not apply q1600     Infusions: . sodium chloride    . heparin 2,300 Units/hr (09/03/19 0700)  . norepinephrine (LEVOPHED) Adult infusion Stopped (09/01/19 1845)     PRN Medications:  sodium chloride, acetaminophen, morphine injection, ondansetron (ZOFRAN) IV, sodium chloride flush    Assessment/Plan   1. CAD: Acute anterior MI,  delayed presentation with no ischemic pain, had pleuritic-type chest pain. LHC with totally occluded mid LAD.  Suspected completed MI, no intervention.   - Continue ASA 81.  - Heparin gtt with bridge to therapeutic warfarin with LV thrombus, INR 1.4 today.  - Atorvastatin 80 mg daily.  2. Chest pain: Patient has mild pleuritic chest pain, improving.  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  - Colchicine 0.6 mg daily.  3. LV thrombus: Large LV thrombus.   - Heparin bridge to therapeutic INR on warfarin, INR 1.4 today. 4. Acute on chronic systolic CHF: Echo with EF 30%, peri-apical akinesis, mildly decreased RV function with akinetic RV apex, moderate AS.  There was concern for cardiogenic shock but he has stabilized.  Co-ox 67% with CVP 4-5.  - Continue digoxin 0.125 daily.  - Continue losartan 12.5 mg bid.  - spironolactone 12.5 daily.   - Add Coreg 3.125 mg bid.  - No Lasix at this time.  - He will need a Lifevest at discharge, repeat echo after 1 month (no revascularization) and ICD if EF remains < 35%.  5. Active smoker: Needs to quit.   Ready for step-down.   Length of Stay: 6  Loralie Champagne, MD  09/03/2019, 9:59 AM  Advanced Heart Failure Team Pager [phone] (M-F; 7a - 4p)  Please contact [facility] for night-coverage after hours (4p -7a ) and weekends on [email or web address]

## 2019-09-03 NOTE — Progress Notes (Signed)
Progress Note   Subjective   Doing well today, the patient denies CP or SOB.  No new concerns  Inpatient Medications    Scheduled Meds: . aspirin  81 mg Oral Daily  . atorvastatin  80 mg Oral Daily  . carvedilol  3.125 mg Oral BID WC  . Chlorhexidine Gluconate Cloth  6 each Topical Daily  . colchicine  0.6 mg Oral Daily  . digoxin  0.125 mg Oral Daily  . icosapent Ethyl  2 g Oral BID  . losartan  12.5 mg Oral BID  . mirtazapine  30 mg Oral QHS  . pantoprazole  40 mg Oral BID  . sertraline  150 mg Oral Daily  . sodium chloride flush  3 mL Intravenous Q12H  . spironolactone  12.5 mg Oral Daily  . Warfarin - Pharmacist Dosing Inpatient   Does not apply q1600   Continuous Infusions: . sodium chloride    . heparin 2,300 Units/hr (09/03/19 0700)  . norepinephrine (LEVOPHED) Adult infusion Stopped (09/01/19 1845)   PRN Meds: sodium chloride, acetaminophen, morphine injection, ondansetron (ZOFRAN) IV, sodium chloride flush   Vital Signs    Vitals:   09/03/19 0600 09/03/19 0700 09/03/19 0740 09/03/19 0943  BP: 96/69 105/79    Pulse: 90 87  86  Resp: (!) 22 (!) 23    Temp:   98.3 F (36.8 C)   TempSrc:   Oral   SpO2: (!) 87% 93%    Weight: 81.1 kg     Height:        Intake/Output Summary (Last 24 hours) at 09/03/2019 0959 Last data filed at 09/03/2019 0700 Gross per 24 hour  Intake 1136.39 ml  Output 1575 ml  Net -438.61 ml   Filed Weights   09/02/19 0500 09/02/19 0552 09/03/19 0600  Weight: 84.5 kg 84.5 kg 81.1 kg    Telemetry    Sinus with rare ventricular ectopy - Personally Reviewed  Physical Exam   GEN- The patient is well appearing, alert and oriented x 3 today.   Head- normocephalic, atraumatic Eyes-  Sclera clear, conjunctiva pink Ears- hearing intact Oropharynx- clear Neck- supple, Lungs-  normal work of breathing Heart- Regular rate and rhythm  GI- soft  Extremities- no clubbing, cyanosis, or edema  MS- no significant deformity or  atrophy Skin- no rash or lesion Psych- euthymic mood, full affect Neuro- strength and sensation are intact   Labs    Chemistry Recent Labs  Lab 08/28/19 1458 08/28/19 1530 08/30/19 0151 08/30/19 0151 08/31/19 0122 09/01/19 0703 09/01/19 2035 09/02/19 0537 09/03/19 0434  NA 133*   < > 132*   < > 135   < > 134* 137 137  K 4.4   < > 3.6   < > 3.7   < > 3.9 4.2 3.7  CL 98   < > 96*   < > 96*   < > 101 102 100  CO2 18*   < > 23   < > 26   < > 22 23 24   GLUCOSE 111*   < > 106*   < > 108*   < > 115* 106* 103*  BUN 17   < > 11   < > 16   < > 14 13 14   CREATININE 1.10   < > 0.84   < > 1.18   < > 0.88 0.84 0.93  CALCIUM 9.6   < > 8.9   < > 9.1   < > 8.8* 8.9  9.2  PROT 7.5  --  6.3*  --  6.4*  --   --   --   --   ALBUMIN 4.3  --  3.0*  --  2.9*  --   --   --   --   AST 339*  --  94*  --  72*  --   --   --   --   ALT 115*  --  58*  --  58*  --   --   --   --   ALKPHOS 63  --  56  --  74  --   --   --   --   BILITOT 1.1  --  1.0  --  0.5  --   --   --   --   GFRNONAA >60   < > >60   < > >60   < > >60 >60 >60  GFRAA >60   < > >60   < > >60   < > >60 >60 >60  ANIONGAP 17*   < > 13   < > 13   < > 11 12 13    < > = values in this interval not displayed.     Hematology Recent Labs  Lab 09/01/19 0703 09/02/19 0537 09/03/19 0434  WBC 9.1 9.3 9.9  RBC 3.91* 3.91* 3.91*  HGB 12.1* 11.9* 11.8*  HCT 35.6* 36.6* 35.8*  MCV 91.0 93.6 91.6  MCH 30.9 30.4 30.2  MCHC 34.0 32.5 33.0  RDW 13.4 13.4 13.2  PLT 212 242 279     Patient ID     Craig Flores a 54 y.o.malewith tobacco use and prior depression but no cardiac hx presented to [facility] with persistent chest pain since Friday night (08/26/19).  Admitted with late presenting anterior MI and acute systolic dysfunction.  revascularization not performed.   Assessment & Plan    1.  Delayed anterior STEMI/ ischemic CM EF 35% (by limited echo 09/01/19) with extensive WMA of the anterior wall with thrombus.  He had  100% mid LAD occlusion by cath with collaterals/ completed infarct.  He will need aggressive medicine titration.  revascularization was not performed. Currently on heparin drip.  He is also on coumadin and ASA. He is on low dose coreg, losartan, and spiro bp too soft for ARB/ARNi currently Eventual SGLT2i Consider ICD if EF remains depressed 40 days post MI.  I would advise lifevest in the interim upon discharge.  2. HL On atorvastatin (high intensity) Did have transient transaminitis (likely due to MI).  Will need to follow LFTs closely  3. Tobacco Cessation advised  4. LV thrombus Continue coumadin + heparin bridge Goal INR  2-3  Ok to transfer to telemetry with close follow-up.  Thompson Grayer MD, [facility] 09/03/2019 9:59 AM

## 2019-09-03 NOTE — Progress Notes (Signed)
CARDIAC REHAB PHASE I   PRE:  Rate/Rhythm: 89 NSR    BP: sitting 104/71    SaO2: 92  MODE:  Ambulation: 740 ft   POST:  Rate/Rhythm: 92 NSR    BP: sitting 100/80     SaO2: 91 RA  [id number] Patient ambulated in hallway independently with CR RN assisting with IV poll/pump. Slow steady gait noted. Denied complaints. VSS. Post ambulation patient back to chair with call bell and phone in place. Wife at bedside. Tolerated ambulation well.  Coe Angelos Minus Breeding RN, BSN

## 2019-09-04 LAB — CBC
HCT: 37.5 % — ABNORMAL LOW (ref 39.0–52.0)
Hemoglobin: 12.1 g/dL — ABNORMAL LOW (ref 13.0–17.0)
MCH: 29.9 pg (ref 26.0–34.0)
MCHC: 32.3 g/dL (ref 30.0–36.0)
MCV: 92.6 fL (ref 80.0–100.0)
Platelets: 299 10*3/uL (ref 150–400)
RBC: 4.05 MIL/uL — ABNORMAL LOW (ref 4.22–5.81)
RDW: 13.2 % (ref 11.5–15.5)
WBC: 8.7 10*3/uL (ref 4.0–10.5)
nRBC: 0 % (ref 0.0–0.2)

## 2019-09-04 LAB — BASIC METABOLIC PANEL
Anion gap: 14 (ref 5–15)
BUN: 13 mg/dL (ref 6–20)
CO2: 23 mmol/L (ref 22–32)
Calcium: 9.1 mg/dL (ref 8.9–10.3)
Chloride: 103 mmol/L (ref 98–111)
Creatinine, Ser: 0.95 mg/dL (ref 0.61–1.24)
GFR calc Af Amer: >60 mL/min (ref >60)
GFR calc non Af Amer: >60 mL/min (ref >60)
Glucose, Bld: 97 mg/dL (ref 70–99)
Potassium: 4.2 mmol/L (ref 3.5–5.1)
Sodium: 140 mmol/L (ref 135–145)

## 2019-09-04 LAB — COOXEMETRY PANEL
Carboxyhemoglobin: 1.4 % (ref 0.5–1.5)
Methemoglobin: 1 % (ref 0.0–1.5)
O2 Saturation: 64.7 %
Total hemoglobin: 12.9 g/dL (ref 12.0–16.0)

## 2019-09-04 LAB — HEPARIN LEVEL (UNFRACTIONATED)
Heparin Unfractionated: 0.79 IU/mL — ABNORMAL HIGH (ref 0.30–0.70)
Heparin Unfractionated: 0.94 IU/mL — ABNORMAL HIGH (ref 0.30–0.70)

## 2019-09-04 LAB — PROTIME-INR
INR: 1.5 — ABNORMAL HIGH (ref 0.8–1.2)
Prothrombin Time: 17.3 seconds — ABNORMAL HIGH (ref 11.4–15.2)

## 2019-09-04 MED ORDER — WARFARIN SODIUM 2.5 MG PO TABS
12.5000 mg | ORAL_TABLET | Freq: Once | ORAL | Status: AC
  Administered 2019-09-04: 12.5 mg via ORAL
  Filled 2019-09-04: qty 1

## 2019-09-04 NOTE — Progress Notes (Signed)
Progress Note  Patient Name: Craig Flores Date of Encounter: 09/04/2019  [facility] Cardiologist: Quay Burow, MD   Subjective   54 year old gentleman who was admitted following a late presentation with  anterior wall myocardial infarction.  Heart catheterization revealed complete occlusion of the mid LAD.  He had a proximal RCA stenosis of 40%.  There was no collaterals to the LAD.  The patient presented late ~ 3 days )  The patient has developed CHF as a result of this anterior wall ST segment elevation myocardial infarction.  Recommendation is that he have a LifeVest for the next 3 months while he heals up from this MI.  Echo shows a severely reduced left ventricular systolic function with EF of 30 to 35%. He has grade 3 diastolic dysfunction. There is layered apical thrombus .  The patient has been started on heparin and is getting warfarin.   Inpatient Medications    Scheduled Meds: . aspirin  81 mg Oral Daily  . atorvastatin  80 mg Oral Daily  . carvedilol  3.125 mg Oral BID WC  . Chlorhexidine Gluconate Cloth  6 each Topical Daily  . colchicine  0.6 mg Oral Daily  . digoxin  0.125 mg Oral Daily  . icosapent Ethyl  2 g Oral BID  . losartan  12.5 mg Oral BID  . mirtazapine  30 mg Oral QHS  . pantoprazole  40 mg Oral BID  . sertraline  150 mg Oral Daily  . sodium chloride flush  3 mL Intravenous Q12H  . spironolactone  12.5 mg Oral Daily  . Warfarin - Pharmacist Dosing Inpatient   Does not apply q1600   Continuous Infusions: . sodium chloride    . heparin 2,200 Units/hr (09/04/19 0713)  . norepinephrine (LEVOPHED) Adult infusion Stopped (09/01/19 1845)   PRN Meds: sodium chloride, acetaminophen, morphine injection, ondansetron (ZOFRAN) IV, sodium chloride flush   Vital Signs    Vitals:   09/04/19 0103 09/04/19 0520 09/04/19 0800 09/04/19 0815  BP:  101/74 102/74   Pulse:  78 84   Resp:   (!) 27 14  Temp:  97.7 F (36.5 C) 97.9 F (36.6 C)     TempSrc:  Oral Oral   SpO2:   92%   Weight: 83 kg     Height:        Intake/Output Summary (Last 24 hours) at 09/04/2019 0928 Last data filed at 09/04/2019 0906 Gross per 24 hour  Intake 378.06 ml  Output 2450 ml  Net -2071.94 ml   Last 3 Weights 09/04/2019 09/03/2019 09/02/2019  Weight (lbs) 182 lb 14.4 oz 178 lb 12.7 oz 186 lb 4.6 oz  Weight (kg) 82.963 kg 81.1 kg 84.5 kg  Some encounter information is confidential and restricted. Go to Review Flowsheets activity to see all data.      Telemetry    NSR  - Personally Reviewed  ECG     - Personally Reviewed  Physical Exam   GEN:  middle age male,   NAD  Neck: No JVD Cardiac: RRR, no murmurs, rubs, or gallops.  Respiratory: Clear to auscultation bilaterally. GI: Soft, nontender, non-distended  MS: No edema; No deformity. Neuro:  Nonfocal  Psych: Normal affect   Labs    High Sensitivity Troponin:   Recent Labs  Lab 08/28/19 1458 08/28/19 1856 08/29/19 1223  TROPONINIHS QUANTITY NOT SUFFICIENT, UNABLE TO PERFORM TEST >27,000* >27,000*      Chemistry Recent Labs  Lab 08/28/19 1458 08/28/19 1530 08/30/19  0151 08/30/19 0151 08/31/19 0122 09/01/19 0703 09/02/19 0537 09/03/19 0434 09/04/19 0540  NA 133*   < > 132*   < > 135   < > 137 137 140  K 4.4   < > 3.6   < > 3.7   < > 4.2 3.7 4.2  CL 98   < > 96*   < > 96*   < > 102 100 103  CO2 18*   < > 23   < > 26   < > 23 24 23   GLUCOSE 111*   < > 106*   < > 108*   < > 106* 103* 97  BUN 17   < > 11   < > 16   < > 13 14 13   CREATININE 1.10   < > 0.84   < > 1.18   < > 0.84 0.93 0.95  CALCIUM 9.6   < > 8.9   < > 9.1   < > 8.9 9.2 9.1  PROT 7.5  --  6.3*  --  6.4*  --   --   --   --   ALBUMIN 4.3  --  3.0*  --  2.9*  --   --   --   --   AST 339*  --  94*  --  72*  --   --   --   --   ALT 115*  --  58*  --  58*  --   --   --   --   ALKPHOS 63  --  56  --  74  --   --   --   --   BILITOT 1.1  --  1.0  --  0.5  --   --   --   --   GFRNONAA >60   < > >60   < > >60   < > >60  >60 >60  GFRAA >60   < > >60   < > >60   < > >60 >60 >60  ANIONGAP 17*   < > 13   < > 13   < > 12 13 14    < > = values in this interval not displayed.     Hematology Recent Labs  Lab 09/02/19 0537 09/03/19 0434 09/04/19 0540  WBC 9.3 9.9 8.7  RBC 3.91* 3.91* 4.05*  HGB 11.9* 11.8* 12.1*  HCT 36.6* 35.8* 37.5*  MCV 93.6 91.6 92.6  MCH 30.4 30.2 29.9  MCHC 32.5 33.0 32.3  RDW 13.4 13.2 13.2  PLT 242 279 299    BNP Recent Labs  Lab 09/02/19 0537  BNP 1,396.1*     DDimer No results for input(s): DDIMER in the last 168 hours.   Radiology    No results found.  Cardiac Studies     Patient Profile     54 y.o. male who presented late after an anterior wall MI.  He has been treated medically.  He is developed apical akinesis and has an apical thrombus.  Assessment & Plan    1.  Anterior STEMI: Patient presented late following an anterior STEMI.  He has a 100% occlusion of his mid LAD.  Ejection fraction is 35%.  2.  Acute combined systolic and diastolic congestive heart failure: He has an ejection fraction of around 35%.  This is an ischemic cardiomyopathy.  He is on low-dose carvedilol, losartan, spironolactone. Blood pressure is too soft right now to consider Entresto but this could  be considered as an outpatient. The plan is to send him home with a LifeVest.  He may need an ICD if his ejection fraction remains low.  2.  Hyperlipidemia: Currently on atorvastatin.  4.  Tobacco use: He has stopped smoking.  5.  Left ventricular thrombus: He is currently on heparin and is on Coumadin.  His goal INR is 2-3.  For questions or updates, please contact [place] Please consult [email or web address] for contact info under        Signed, Mertie Moores, MD  09/04/2019, 9:28 AM

## 2019-09-04 NOTE — Progress Notes (Addendum)
ANTICOAGULATION CONSULT NOTE - Follow Up Consult  Pharmacy Consult for heparin, warfarin Indication: LV thrombus  Labs: Recent Labs    09/02/19 0537 09/02/19 1221 09/03/19 0434 09/04/19 0540 09/04/19 1300  HGB 11.9*  --  11.8* 12.1*  --   HCT 36.6*  --  35.8* 37.5*  --   PLT 242  --  279 299  --   LABPROT 15.1  --  16.6* 17.3*  --   INR 1.2  --  1.4* 1.5*  --   HEPARINUNFRC 0.26*   < > 0.59 0.79* 0.94*  CREATININE 0.84  --  0.93 0.95  --    < > = values in this interval not displayed.    Assessment: Craig Flores is a 54 YO male with no prior cardiac hx presented with persistent chest pain x 3 days.  EKG on admit concerning for STEMI.  Pharmacy was consulted to initiate heparin and warfarin after ECHO on 8/2 showed LV thrombus. No signs of bleeding noted. Hgb, Hct low but trending up, Plt WNL.  -heparin level= 0.94, is supratheraputic. -INR= 1.5, is subtheraputic   Goal of Therapy:  Heparin level 0.3-0.7 units/ml INR 2-3  Plan:  Decrease heparin rate to 2050 U/hr Warfarin 12.5mg  po today Daily PT/INR Check Heparin level in 6 hours and daily while on heparin Daily CBC   Norina Buzzard, PharmD PGY1 Pharmacy Resident 09/04/2019 1:56 PM  **Pharmacist phone directory can now be found on [email or web address] ([email or web address]).  Listed under [place].

## 2019-09-05 LAB — BASIC METABOLIC PANEL
Anion gap: 14 (ref 5–15)
BUN: 13 mg/dL (ref 6–20)
CO2: 20 mmol/L — ABNORMAL LOW (ref 22–32)
Calcium: 9.1 mg/dL (ref 8.9–10.3)
Chloride: 104 mmol/L (ref 98–111)
Creatinine, Ser: 1.01 mg/dL (ref 0.61–1.24)
GFR calc Af Amer: >60 mL/min (ref >60)
GFR calc non Af Amer: >60 mL/min (ref >60)
Glucose, Bld: 100 mg/dL — ABNORMAL HIGH (ref 70–99)
Potassium: 4.5 mmol/L (ref 3.5–5.1)
Sodium: 138 mmol/L (ref 135–145)

## 2019-09-05 LAB — CBC
HCT: 38.9 % — ABNORMAL LOW (ref 39.0–52.0)
Hemoglobin: 12.9 g/dL — ABNORMAL LOW (ref 13.0–17.0)
MCH: 30.4 pg (ref 26.0–34.0)
MCHC: 33.2 g/dL (ref 30.0–36.0)
MCV: 91.7 fL (ref 80.0–100.0)
Platelets: 303 10*3/uL (ref 150–400)
RBC: 4.24 MIL/uL (ref 4.22–5.81)
RDW: 13.2 % (ref 11.5–15.5)
WBC: 8.9 10*3/uL (ref 4.0–10.5)
nRBC: 0 % (ref 0.0–0.2)

## 2019-09-05 LAB — COOXEMETRY PANEL
Carboxyhemoglobin: 1 % (ref 0.5–1.5)
Methemoglobin: 0.8 % (ref 0.0–1.5)
O2 Saturation: 95.1 %
Total hemoglobin: 13.1 g/dL (ref 12.0–16.0)

## 2019-09-05 LAB — PROTIME-INR
INR: 1.7 — ABNORMAL HIGH (ref 0.8–1.2)
Prothrombin Time: 19.6 seconds — ABNORMAL HIGH (ref 11.4–15.2)

## 2019-09-05 LAB — HEPARIN LEVEL (UNFRACTIONATED)
Heparin Unfractionated: 0.53 IU/mL (ref 0.30–0.70)
Heparin Unfractionated: 0.5 IU/mL (ref 0.30–0.70)

## 2019-09-05 MED ORDER — WARFARIN SODIUM 10 MG PO TABS
10.0000 mg | ORAL_TABLET | Freq: Once | ORAL | Status: AC
  Administered 2019-09-05: 10 mg via ORAL
  Filled 2019-09-05: qty 1

## 2019-09-05 NOTE — Progress Notes (Signed)
Patient ID: Craig Flores, male   DOB: 25-Jul-1965, 54 y.o.   MRN: [id number]     Advanced Heart Failure Rounding Note  PCP-Cardiologist: Quay Burow, MD   Subjective:    Mild chest pain with deep breathing, otherwise no problems.  No dyspnea.    CVP 4, co-ox not accurate. SBP 90s, denies lightheadedness.  Has walked in halls.    Objective:   Weight Range: 83 kg Body mass index is 27.02 kg/m.   Vital Signs:   Temp:  [97.4 F (36.3 C)-98.6 F (37 C)] 98 F (36.7 C) (08/09 0830) Pulse Rate:  [75-82] 75 (08/09 0100) Resp:  [18-22] 20 (08/09 0100) BP: (99-103)/(70-82) 100/72 (08/09 0100) SpO2:  [92 %-96 %] 93 % (08/09 0100) Weight:  [83 kg] 83 kg (08/09 0100) Last BM Date: 09/04/19  Weight change: Filed Weights   09/03/19 0600 09/04/19 0103 09/05/19 0100  Weight: 81.1 kg 83 kg 83 kg    Intake/Output:   Intake/Output Summary (Last 24 hours) at 09/05/2019 0856 Last data filed at 09/05/2019 0831 Gross per 24 hour  Intake 1122 ml  Output 3325 ml  Net -2203 ml      Physical Exam    General: NAD Neck: No JVD, no thyromegaly or thyroid nodule.  Lungs: Clear to auscultation bilaterally with normal respiratory effort. CV: Nondisplaced PMI.  Heart regular S1/S2, no S3/S4, no murmur or rub.  No peripheral edema.   Abdomen: Soft, nontender, no hepatosplenomegaly, no distention.  Skin: Intact without lesions or rashes.  Neurologic: Alert and oriented x 3.  Psych: Normal affect. Extremities: No clubbing or cyanosis.  HEENT: Normal.    Telemetry   NSR 80s (personally reviewed)  Labs    CBC Recent Labs    09/04/19 0540 09/05/19 0524  WBC 8.7 8.9  HGB 12.1* 12.9*  HCT 37.5* 38.9*  MCV 92.6 91.7  PLT 299 408   Basic Metabolic Panel Recent Labs    09/04/19 0540 09/05/19 0524  NA 140 138  K 4.2 4.5  CL 103 104  CO2 23 20*  GLUCOSE 97 100*  BUN 13 13  CREATININE 0.95 1.01  CALCIUM 9.1 9.1   Liver Function Tests No results for input(s): AST, ALT,  ALKPHOS, BILITOT, PROT, ALBUMIN in the last 72 hours. No results for input(s): LIPASE, AMYLASE in the last 72 hours. Cardiac Enzymes No results for input(s): CKTOTAL, CKMB, CKMBINDEX, TROPONINI in the last 72 hours.  BNP: BNP (last 3 results) Recent Labs    09/02/19 0537  BNP 1,396.1*    ProBNP (last 3 results) No results for input(s): PROBNP in the last 8760 hours.   D-Dimer No results for input(s): DDIMER in the last 72 hours. Hemoglobin A1C No results for input(s): HGBA1C in the last 72 hours. Fasting Lipid Panel No results for input(s): CHOL, HDL, LDLCALC, TRIG, CHOLHDL, LDLDIRECT in the last 72 hours. Thyroid Function Tests No results for input(s): TSH, T4TOTAL, T3FREE, THYROIDAB in the last 72 hours.  Invalid input(s): FREET3  Other results:   Imaging    No results found.   Medications:     Scheduled Medications: . aspirin  81 mg Oral Daily  . atorvastatin  80 mg Oral Daily  . carvedilol  3.125 mg Oral BID WC  . Chlorhexidine Gluconate Cloth  6 each Topical Daily  . colchicine  0.6 mg Oral Daily  . digoxin  0.125 mg Oral Daily  . icosapent Ethyl  2 g Oral BID  . losartan  12.5 mg  Oral BID  . mirtazapine  30 mg Oral QHS  . pantoprazole  40 mg Oral BID  . sertraline  150 mg Oral Daily  . sodium chloride flush  3 mL Intravenous Q12H  . spironolactone  12.5 mg Oral Daily  . warfarin  10 mg Oral ONCE-1600  . Warfarin - Pharmacist Dosing Inpatient   Does not apply q1600    Infusions: . sodium chloride    . heparin 2,050 Units/hr (09/04/19 1447)    PRN Medications: sodium chloride, acetaminophen, morphine injection, ondansetron (ZOFRAN) IV, sodium chloride flush    Assessment/Plan   1. CAD: Acute anterior MI, delayed presentation with no ischemic pain, had pleuritic-type chest pain. LHC with totally occluded mid LAD.  Suspected completed MI, no intervention.   - Continue ASA 81.  - Heparin gtt with bridge to therapeutic warfarin with LV thrombus,  INR 1.7 today.  - Atorvastatin 80 mg daily.  2. Chest pain: Patient has mild pleuritic chest pain, improving.  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  - Colchicine 0.6 mg daily.  3. LV thrombus: Large LV thrombus.   - Heparin bridge to therapeutic INR on warfarin, INR 1.7 today. 4. Acute on chronic systolic CHF: Echo with EF 30%, peri-apical akinesis, mildly decreased RV function with akinetic RV apex, moderate AS.  There was concern for cardiogenic shock but he has stabilized.  Co-ox inaccurate today with CVP 4.  - Continue digoxin 0.125 daily.  - Continue losartan 12.5 mg bid.  - spironolactone 12.5 daily.   - Coreg 3.125 mg bid.  - No Lasix at this time.  - He will need a Lifevest at discharge, repeat echo after 1 month (no revascularization) and ICD if EF remains < 35%.  5. Active smoker: Needs to quit.   Hopefully home tomorrow with Lifevest if INR > 2.   Length of Stay: 8  Loralie Champagne, MD  09/05/2019, 8:56 AM  Advanced Heart Failure Team Pager [phone] (M-F; 7a - 4p)  Please contact [facility] for night-coverage after hours (4p -7a ) and weekends on [email or web address]

## 2019-09-05 NOTE — TOC Progression Note (Addendum)
Transition of Care [facility]) - Progression Note    Patient Details  Name: Craig Flores MRN: [id number] Date of Birth: [birth year]  Transition of Care [facility] [place]) CM/SW Contact  Bartholomew Crews, RN Phone Number: [phone] 09/05/2019, 1:47 PM  Clinical Narrative:     Received call from Dorian Pod at [facility] concerning [place]. Clinical documents requested - faxed to [phone]. Anticipate patient to be fitted for Life Vest tonight.   Acknowledging consult for cost of colchicine. Anticipate that patient will need [place] letter for dc medications, and plan for refills at [facility] and for patient assistance for medications.   Spoke with patient and spouse at the bedside. Discussed f/u appointment at PCP. Discussed [place] letter for discharge medications and $3 copay per medications - patient and spouse agreeable. Discussed refills at [facility] pharmacy. Patient states that he is hopeful for transition home tomorrow depending medical work up.  Spoke with Loma Sousa at [place] about patient assistance program for colchicine. Pharmacy will assist patient with completing paperwork for patient assistance program.   TOC following for transition needs.   Expected Discharge Plan: Home/Self Care Barriers to Discharge: Continued Medical Work up  Expected Discharge Plan and Services Expected Discharge Plan: Home/Self Care In-house Referral: PCP / [facility] Discharge Planning Services: CM Consult   Living arrangements for the past 2 months: Apartment                 DME Arranged: Life vest DME Agency: [facility] Date DME Agency Contacted: 09/05/19 Time DME Agency Contacted: 0037 Representative spoke with at DME Agency: [place] ([place]) Interventions    Readmission Risk Interventions Readmission Risk Prevention Plan 08/30/2019  Post Dischage Appt Complete  Medication Screening Complete  Transportation Screening Complete  Some recent data might be hidden

## 2019-09-05 NOTE — Progress Notes (Signed)
CARDIAC REHAB PHASE I   PRE:  Rate/Rhythm: 81 SR  BP:  Supine: 97/73  Sitting:   Standing:    SaO2: 93%RA  MODE:  Ambulation: 840 ft   POST:  Rate/Rhythm: 94 SR  BP:  Supine:   Sitting: 106/78  Standing:    SaO2: 93%RA 0922-1022 Pt walked 840 ft on RA with steady gait. Tolerated well. No c/o SOB. To recliner with call bell. Gave pt CHF booklet and low sodium diets. Discussed 2 L FR, 2000 mg sodium restriction, and daily weights and when to call MD with signs/symptoms.  Gave listing for videos that pt can watch re lifevest and Coumadin. Pt voiced understanding of ed.   Graylon Good, RN BSN  09/05/2019 10:19 AM

## 2019-09-05 NOTE — Progress Notes (Signed)
ANTICOAGULATION CONSULT NOTE - Follow Up Consult  Pharmacy Consult for heparin, warfarin Indication: LV thrombus  Labs: Recent Labs    09/03/19 0434 09/03/19 0434 09/04/19 0540 09/04/19 0540 09/04/19 1300 09/05/19 0025 09/05/19 0524 09/05/19 0536  HGB 11.8*   < > 12.1*  --   --   --  12.9*  --   HCT 35.8*  --  37.5*  --   --   --  38.9*  --   PLT 279  --  299  --   --   --  303  --   LABPROT 16.6*  --  17.3*  --   --   --   --  19.6*  INR 1.4*  --  1.5*  --   --   --   --  1.7*  HEPARINUNFRC 0.59   < > 0.79*   < > 0.94* 0.53  --  0.50  CREATININE 0.93  --  0.95  --   --   --  1.01  --    < > = values in this interval not displayed.    Assessment: Craig Flores is a 54 years of age male with no prior cardiac history who presented with persistent chest pain x 3 days and was diagnosed with LV thrombus. Pharmacy was consulted to initiate heparin and warfarin - currently on day 7 of overlap.   INR is sub-therapeutic at 1.7 today but rising well - 0.2 increase over 24hrs after higher dose of 12.5mg  x1 and two days of 10mg  x2.  Heparin level is therapeutic x 2 levels on a rate of 2050 units/hr.  CBC is stable. No bleeding noted.    Goal of Therapy:  Heparin level 0.3-0.7 units/ml INR 2-3  Plan:  Continue IV Heparin rate at 2050 units/hr.  Warfarin 10mg  po today - to prevent overshooting as has been rising steadily after increase to 10 mg daily. Daily PT/INR and Heparin level while on therapy.  Daily CBC   Sloan Leiter, PharmD, BCPS, BCCCP Clinical Pharmacist Please refer to [facility] for [place] numbers 09/05/2019 7:58 AM  **Pharmacist phone directory can now be found on [email or web address] ([email or web address]).  Listed under [place].

## 2019-09-05 NOTE — Progress Notes (Signed)
ANTICOAGULATION CONSULT NOTE - Follow Up Consult  Pharmacy Consult for heparin Indication: LV thrombus   Labs: Recent Labs    09/02/19 0537 09/02/19 1221 09/03/19 0434 09/03/19 0434 09/04/19 0540 09/04/19 1300 09/05/19 0025  HGB 11.9*  --  11.8*  --  12.1*  --   --   HCT 36.6*  --  35.8*  --  37.5*  --   --   PLT 242  --  279  --  299  --   --   LABPROT 15.1  --  16.6*  --  17.3*  --   --   INR 1.2  --  1.4*  --  1.5*  --   --   HEPARINUNFRC 0.26*   < > 0.59   < > 0.79* 0.94* 0.53  CREATININE 0.84  --  0.93  --  0.95  --   --    < > = values in this interval not displayed.    Assessment/Plan:  54yo male therapeutic on heparin after rate change. Will continue gtt at current rate and confirm stable with additional level.   Wynona Neat, PharmD, BCPS  09/05/2019,1:46 AM

## 2019-09-06 ENCOUNTER — Other Ambulatory Visit (HOSPITAL_COMMUNITY): Payer: Self-pay | Admitting: Cardiology

## 2019-09-06 ENCOUNTER — Encounter (HOSPITAL_COMMUNITY): Payer: Self-pay | Admitting: Cardiovascular Disease

## 2019-09-06 LAB — COOXEMETRY PANEL
Carboxyhemoglobin: 1.3 % (ref 0.5–1.5)
Methemoglobin: 0.9 % (ref 0.0–1.5)
O2 Saturation: 66.1 %
Total hemoglobin: 15 g/dL (ref 12.0–16.0)

## 2019-09-06 LAB — CBC
HCT: 38.1 % — ABNORMAL LOW (ref 39.0–52.0)
Hemoglobin: 12.4 g/dL — ABNORMAL LOW (ref 13.0–17.0)
MCH: 30.3 pg (ref 26.0–34.0)
MCHC: 32.5 g/dL (ref 30.0–36.0)
MCV: 93.2 fL (ref 80.0–100.0)
Platelets: 343 10*3/uL (ref 150–400)
RBC: 4.09 MIL/uL — ABNORMAL LOW (ref 4.22–5.81)
RDW: 13.1 % (ref 11.5–15.5)
WBC: 9.5 10*3/uL (ref 4.0–10.5)
nRBC: 0 % (ref 0.0–0.2)

## 2019-09-06 LAB — HEPARIN LEVEL (UNFRACTIONATED): Heparin Unfractionated: 0.54 IU/mL (ref 0.30–0.70)

## 2019-09-06 LAB — PROTIME-INR
INR: 2 — ABNORMAL HIGH (ref 0.8–1.2)
Prothrombin Time: 21.6 seconds — ABNORMAL HIGH (ref 11.4–15.2)

## 2019-09-06 MED ORDER — SPIRONOLACTONE 25 MG PO TABS: 12.5000 mg | ORAL_TABLET | Freq: Every day | ORAL | 5 refills | Status: DC

## 2019-09-06 MED ORDER — WARFARIN SODIUM 10 MG PO TABS: 10.0000 mg | ORAL_TABLET | Freq: Once | ORAL | Status: DC

## 2019-09-06 MED ORDER — WARFARIN SODIUM 5 MG PO TABS: ORAL_TABLET | ORAL | 0 refills | Status: DC

## 2019-09-06 MED ORDER — WARFARIN SODIUM 10 MG PO TABS
10.0000 mg | ORAL_TABLET | Freq: Once | ORAL | Status: AC
  Administered 2019-09-06: 10 mg via ORAL
  Filled 2019-09-06: qty 1

## 2019-09-06 MED ORDER — OMEGA-3-ACID ETHYL ESTERS 1 G PO CAPS
2.0000 g | ORAL_CAPSULE | Freq: Two times a day (BID) | ORAL | 5 refills | Status: DC
Start: 2019-09-06 — End: 2019-12-05

## 2019-09-06 MED ORDER — CARVEDILOL 3.125 MG PO TABS: 3.1250 mg | ORAL_TABLET | Freq: Two times a day (BID) | ORAL | 5 refills | Status: DC

## 2019-09-06 MED ORDER — COLCHICINE 0.6 MG PO TABS: 0.6000 mg | ORAL_TABLET | Freq: Every day | ORAL | 2 refills | Status: DC

## 2019-09-06 MED ORDER — ATORVASTATIN CALCIUM 80 MG PO TABS: 80.0000 mg | ORAL_TABLET | Freq: Every day | ORAL | 5 refills | Status: DC

## 2019-09-06 MED ORDER — LOSARTAN POTASSIUM 25 MG PO TABS: 12.5000 mg | ORAL_TABLET | Freq: Two times a day (BID) | ORAL | 5 refills | Status: DC

## 2019-09-06 MED ORDER — ASPIRIN 81 MG PO CHEW: 81.0000 mg | CHEWABLE_TABLET | Freq: Every day | ORAL | 11 refills | Status: DC

## 2019-09-06 MED ORDER — DIGOXIN 125 MCG PO TABS: 0.1250 mg | ORAL_TABLET | Freq: Every day | ORAL | 5 refills | Status: DC

## 2019-09-06 MED ORDER — FUROSEMIDE 20 MG PO TABS
20.0000 mg | ORAL_TABLET | ORAL | 11 refills | Status: DC | PRN
Start: 2019-09-06 — End: 2019-09-06

## 2019-09-06 MED FILL — ATORVASTATIN CALCIUM 80 MG: 80 | 30 days supply | Qty: 30 | Fill #0

## 2019-09-06 MED FILL — COLCHICINE 0.6 MG TABS: 0.6 | 30 days supply | Qty: 30 | Fill #0

## 2019-09-06 MED FILL — DIGOXIN 0.125 MG TABLET: 125 | 30 days supply | Qty: 30 | Fill #0

## 2019-09-06 MED FILL — ASPIRIN LOW DOSE 81 MG CHEW: 81 | 30 days supply | Qty: 30 | Fill #0

## 2019-09-06 MED FILL — WARFARIN SODIUM 5 MG TABLET: 5 | 30 days supply | Qty: 54 | Fill #0

## 2019-09-06 MED FILL — FUROSEMIDE 20 MG TAB: 20 | 30 days supply | Qty: 30 | Fill #0

## 2019-09-06 MED FILL — SPIRONOLACTONE 25 MG TABLET: 25 | 30 days supply | Qty: 15 | Fill #0

## 2019-09-06 MED FILL — LOSARTAN POTASSIUM 25 MG TA: 25 | 30 days supply | Qty: 30 | Fill #0

## 2019-09-06 MED FILL — OMEGA-3 ETHYL ESTERS 1 GM C: 1 | 15 days supply | Qty: 60 | Fill #0

## 2019-09-06 MED FILL — CARVEDILOL 3.125 MG TABLET: 3.125 | 30 days supply | Qty: 60 | Fill #0

## 2019-09-06 NOTE — Progress Notes (Addendum)
ANTICOAGULATION CONSULT NOTE - Follow Up Consult  Pharmacy Consult for heparin, warfarin Indication: LV thrombus  Labs: Recent Labs    09/04/19 0540 09/04/19 0540 09/04/19 1300 09/05/19 0025 09/05/19 0524 09/05/19 0536 09/06/19 0500  HGB 12.1*   < >  --   --  12.9*  --  12.4*  HCT 37.5*  --   --   --  38.9*  --  38.1*  PLT 299  --   --   --  303  --  343  LABPROT 17.3*  --   --   --   --  19.6* 21.6*  INR 1.5*  --   --   --   --  1.7* 2.0*  HEPARINUNFRC 0.79*  --    < > 0.53  --  0.50 0.54  CREATININE 0.95  --   --   --  1.01  --   --    < > = values in this interval not displayed.    Assessment: Craig Flores is a 54 years of age male with no prior cardiac history who presented with persistent chest pain x 3 days and was diagnosed with LV thrombus. Pharmacy was consulted to initiate heparin and warfarin - currently on day 8 of overlap.   INR is therapeutic at 2 today. Heparin level was therapeutic at 0.54, on 2050 units/hr. Hgb 12.4, plt 343. No s/sx of bleeding or infusion issues. Average warfarin requirement this week ~9 mg/day.   Goal of Therapy:  Heparin level 0.3-0.7 units/ml INR 2-3  Plan:  Stop heparin infusion.   Warfarin 10 mg tonight - would discharge on warfarin 10 mg daily except 7.5 mg on MWF  Would consider INR check this week  Daily PT/INR, CBC while in hospital  Antonietta Jewel, PharmD, [place] Pharmacist  Phone: [phone] 09/06/2019 9:57 AM  Please check AMION for all [place] phone numbers After 10:00 PM, call [place] [phone]

## 2019-09-06 NOTE — Progress Notes (Signed)
D/C instructions given and reviewed. No questions voiced at this time. Encouraged to call with any questions. Tele and IV's removed. Tolerated well. IV team consult entered for removal of right IJ. Awaiting TOC pharmacy to deliver meds.

## 2019-09-06 NOTE — Progress Notes (Signed)
CARDIAC REHAB PHASE I   PRE:  Rate/Rhythm: 75 SR  BP:  Supine: 110/87  Sitting: 104/73  Standing:    SaO2: 94%RA  MODE:  Ambulation: 800 ft   POST:  Rate/Rhythm: 94 SR  BP:  Supine:   Sitting: 109/71  Standing:    SaO2: 95%RA 0955-1030 Pt walked 800 ft on RA with steady gait and I managed IV pole and lines. Tolerated well. Stated this walk felt the best so far. Gave brochure for CRP 2. Has referral to [place].    Graylon Good, RN BSN  09/06/2019 10:21 AM

## 2019-09-06 NOTE — Plan of Care (Signed)
  Problem: Education: Goal: Understanding of CV disease, CV risk reduction, and recovery process will improve Outcome: Progressing Goal: Individualized Educational Video(s) Outcome: Progressing   Problem: Cardiovascular: Goal: Ability to achieve and maintain adequate cardiovascular perfusion will improve Outcome: Progressing Goal: Vascular access site(s) Level 0-1 will be maintained Outcome: Progressing   Problem: Health Behavior/Discharge Planning: Goal: Ability to safely manage health-related needs after discharge will improve Outcome: Progressing   Problem: Education: Goal: Knowledge of General Education information will improve Description: Including pain rating scale, medication(s)/side effects and non-pharmacologic comfort measures Outcome: Progressing   Problem: Health Behavior/Discharge Planning: Goal: Ability to manage health-related needs will improve Outcome: Progressing   Problem: Clinical Measurements: Goal: Ability to maintain clinical measurements within normal limits will improve Outcome: Progressing Goal: Will remain free from infection Outcome: Progressing Goal: Diagnostic test results will improve Outcome: Progressing Goal: Respiratory complications will improve Outcome: Progressing Goal: Cardiovascular complication will be avoided Outcome: Progressing   Problem: Activity: Goal: Risk for activity intolerance will decrease Outcome: Progressing   Problem: Nutrition: Goal: Adequate nutrition will be maintained Outcome: Progressing   Problem: Coping: Goal: Level of anxiety will decrease Outcome: Progressing   Problem: Elimination: Goal: Will not experience complications related to bowel motility Outcome: Progressing Goal: Will not experience complications related to urinary retention Outcome: Progressing   Problem: Pain Managment: Goal: General experience of comfort will improve Outcome: Progressing   Problem: Safety: Goal: Ability to remain  free from injury will improve Outcome: Progressing   Problem: Skin Integrity: Goal: Risk for impaired skin integrity will decrease Outcome: Progressing

## 2019-09-06 NOTE — Progress Notes (Signed)
   09/06/19 0000  Assess: MEWS Score  BP 93/62  Pulse Rate 77  ECG Heart Rate 77  Resp (!) 25  SpO2 91 %  Assess: MEWS Score  MEWS Temp 0  MEWS Systolic 1  MEWS Pulse 0  MEWS RR 1  MEWS LOC 0  MEWS Score 2  MEWS Score Color Yellow  Assess: if the MEWS score is Yellow or Red  Were vital signs taken at a resting state? No  Focused Assessment No change from prior assessment  Early Detection of Sepsis Score *See Row Information* Low  MEWS guidelines implemented *See Row Information* No, vital signs rechecked  Respiratory rate counted inaccurately by monitor. Manually counted rate and achieved value of 20.

## 2019-09-06 NOTE — Care Management (Signed)
Verified Life Vest in room, MATCH entered into system, Meds to be filled by [facility] pharmacy

## 2019-09-06 NOTE — Discharge Summary (Signed)
Advanced Heart Failure Team  Discharge Summary   Patient ID: Craig Flores MRN: [id number], DOB/AGE: Sep 19, 1965 54 y.o. Admit date: 08/28/2019 D/C date:     09/06/2019   Primary Discharge Diagnoses:  Anterior STEMI/ CAD Acute Systolic Heart Failure Ischemic Cardiomyopathy  Post -MI Pericarditis Hyperlipidemia LV Thrombus Anticoagulation Therapy w/ Coumadin  Tobacco Abuse    Hospital Course:   54 y/o male smoker w/ no prior cardiac history, admitted on 08/28/19 for delayed presentation anterior STEMI. Had had >36 hr of chest pain prior to seeking medical attention. Pain progressed to more pleuritic like CP. Found to have anterior ST elevations w/ precordial Q waves on admit. Hs troponin >27,000. Urgent cardiac cath showed totally occluded mid LAD w/o collaterals and 40% mid RCA stenosis. It was suspected he had completed his infarct and residual pleuritic CP was post MI pericarditis. No intervention was performed. 2D echo demonstrated moderately reduced LVEF, 45-50%, + apical thrombus and moderate AS. MV normal. RV systolic function mildly reduced.   He was placed on medical management for CAD and systolic HF. He was started on ASA, atorvastatin, Losartan and Coreg. Coumadin started for apical thrombus w/ heparin bridge.   Post cath, there were initial concerns for developing cardiogenic shock but he remained stable and did not require pressor/inotropic support. Co-ox remained stable. Repeat echo showed further reduction of LVEF down to 30-35% but no post MI mechanical complications. RV mildly reduced. He did require IV Lasix for pulmonary edema and volume status and dyspnea improved w/ diuresis. He was continued on GTDMT w/ losartan, spironolactone and Coreg. BP too soft for Entresto. Given anterior MI w/ EF <35%, he was fitted w/ a LifeVest prior to discharge w/ plans to get repeat echo in 1 month. If EF remains < 35%, he will be referred to EP for potential ICD.    Also of note, he  developed pleuritic chest pain that was felt to be post-MI pericarditis. He was started on colchicine w/ improvement in symptoms. Will continue x 3 months.   Given LV thrombus he was placed on IV heparin until INR reached therapeutic range on coumadin. INR 2.0 on discharge. INRs and coumadin dosing will be followed/ managed by [facility].   On 8/10, he was seen and examined by Dr. Aundra Dubin and felt stable for discharge home.  Post hospital f/u will be arranged in the [facility] and at [facility].   See Detailed Hospital Problem List Below 1. CAD: Acute anterior MI, delayed presentation with no ischemic pain, had pleuritic-type chest pain. LHC with totally occluded mid LAD. Suspected completed MI, no intervention.  - Continue ASA 81.  - on Coumadin w/ heparin bridge for LV thrombus. INR therapeutic today at 2.0. Stop IV heparin - Atorvastatin 80 mg daily. LDL goal < 70. Unable to afford Vesepa w/o insurance. Will change to Lovaza   2. Chest pain: Patient has mild pleuritic chest pain, improving. 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  - Continue Colchicine 0.6 mg daily x 3 months.  3. LV thrombus: Large LV thrombus.  - On warfarin, INR 2.0 today. Stop IV heparin  4. Acute on chronic systolic CHF: Echo with EF 30%, peri-apical akinesis, mildly decreased RV function with akinetic RV apex, moderate AS. There was concern for cardiogenic shock but he has stabilized. Co-ox 66% today. Euvolemic on exam. No exertional dyspnea ambulating w/ cardiac rehab.  - Continue digoxin 0.125 daily.  - Continue losartan 12.5 mg bid. BP too soft for Entresto  - spironolactone 12.5  daily.   - Coreg 3.125 mg bid.  - Will d/c home w/ PRN Lasix 20 mg  - Will send home w/ LifeVest. Repeat echo after 1 month (no revascularization) and ICD if EF remains < 35%.  5. Active smoker: Needs to quit.  Plan d/c home today. Will arrange post hospital f/u in [facility] and repeat echo in 1 month. His INRs/ coumadin will be  monitored/ managed by [facility]   Discharge Weight Range: 181 lb  Discharge Vitals: Blood pressure 101/74, pulse 81, temperature 98.8 F (37.1 C), temperature source Oral, resp. rate (!) 27, height 5\' 9"  (1.753 m), weight 82.3 kg, SpO2 93 %.  Labs: Lab Results  Component Value Date   WBC 9.5 09/06/2019   HGB 12.4 (L) 09/06/2019   HCT 38.1 (L) 09/06/2019   MCV 93.2 09/06/2019   PLT 343 09/06/2019    Recent Labs  Lab 08/31/19 0122 09/01/19 0703 09/05/19 0524  NA 135   < > 138  K 3.7   < > 4.5  CL 96*   < > 104  CO2 26   < > 20*  BUN 16   < > 13  CREATININE 1.18   < > 1.01  CALCIUM 9.1   < > 9.1  PROT 6.4*  --   --   BILITOT 0.5  --   --   ALKPHOS 74  --   --   ALT 58*  --   --   AST 72*  --   --   GLUCOSE 108*   < > 100*   < > = values in this interval not displayed.   Lab Results  Component Value Date   CHOL 236 (H) 08/30/2019   HDL 29 (L) 08/30/2019   LDLCALC UNABLE TO CALCULATE IF TRIGLYCERIDE OVER 400 mg/dL 08/30/2019   TRIG 495 (H) 08/30/2019   BNP (last 3 results) Recent Labs    09/02/19 0537  BNP 1,396.1*    ProBNP (last 3 results) No results for input(s): PROBNP in the last 8760 hours.   Diagnostic Studies/Procedures   Diagnostic LHC 08/28/19  Coronary Findings  Diagnostic Dominance: Right Left Anterior Descending  Mid LAD lesion is 100% stenosed.  Right Coronary Artery  Prox RCA to Mid RCA lesion is 40% stenosed.  Intervention  No interventions have been documented. Coronary Diagrams  Diagnostic Dominance: Right  Intervention   2D Echo 08/29/19 LV apical thrombus present 2 x 1 cm. LV mid and apical akinesis with hyperdynamic base. Left ventricular ejection fraction, by estimation, is 45 to 50%. The left ventricle has mildly decreased function. The left ventricle demonstrates regional wall motion abnormalities (see scoring diagram/findings for description). Left ventricular diastolic parameters are indeterminate. 2. Right  ventricular systolic function preserved at the base with mildly reduced apical function. The right ventricular size is normal. There is normal pulmonary artery systolic pressure. 3. The mitral valve is normal in structure. No evidence of mitral valve regurgitation. No evidence of mitral stenosis. 4. Possible bicuspid aortic valve with calcified raphe between right and left coronary cusps. The aortic valve has an indeterminant number of cusps. Aortic valve regurgitation is not visualized. Mild to moderate aortic valve stenosis. Aortic valve mean gradient measures 19.0 mmHg. 5. Cannot exclude aortic coarctation based on 2-D images. Continuous wave Doppler in descending thoracic aorta suggests continuous flow, suggestive of possible flow limiting lesion, however baseline artifact limits this assessment. 6. The inferior vena cava is normal in size with <50% respiratory variability, suggesting right atrial  pressure of 8 mmHg.  Repeat Limited Echo 09/01/19 Akinesis of the entire apex with overall moderate to severe LV dysfunction; restrictive filling; layered apical thrombus; elevated gradient across aortic valve suggests at least moderate AS (mean gradient 19 mmHg; not well interrogated); trace AI. 2. Left ventricular ejection fraction, by estimation, is 30 to 35%. The left ventricle has moderately decreased function. The left ventricle demonstrates regional wall motion abnormalities (see scoring diagram/findings for description). Left ventricular diastolic parameters are consistent with Grade III diastolic dysfunction (restrictive). 3. Right ventricular systolic function is mildly reduced. The right ventricular size is normal. 4. The mitral valve is normal in structure. Trivial mitral valve regurgitation. No evidence of mitral stenosis. 5. The aortic valve has an indeterminant number of cusps. Aortic valve regurgitation is trivial. Moderate aortic valve stenosis. 6. The inferior vena cava is  normal in size with greater than 50% respiratory variability, suggesting right atrial pressure of 3 mmHg.   Discharge Medications   Allergies as of 09/06/2019   No Known Allergies     Medication List    TAKE these medications   aspirin 81 MG chewable tablet Chew 1 tablet (81 mg total) by mouth daily. Start taking on: September 07, 2019   atorvastatin 80 MG tablet Commonly known as: LIPITOR Take 1 tablet (80 mg total) by mouth daily. Start taking on: September 07, 2019   carvedilol 3.125 MG tablet Commonly known as: COREG Take 1 tablet (3.125 mg total) by mouth 2 (two) times daily with a meal.   colchicine 0.6 MG tablet Take 1 tablet (0.6 mg total) by mouth daily. Start taking on: September 07, 2019   digoxin 0.125 MG tablet Commonly known as: LANOXIN Take 1 tablet (0.125 mg total) by mouth daily. Start taking on: September 07, 2019   furosemide 20 MG tablet Commonly known as: Lasix Take 1 tablet (20 mg total) by mouth as needed for fluid or edema (take if you gain more than 5 lb in a week).   losartan 25 MG tablet Commonly known as: COZAAR Take 0.5 tablets (12.5 mg total) by mouth 2 (two) times daily.   mirtazapine 30 MG tablet Commonly known as: REMERON Take 1 tablet (30 mg total) by mouth at bedtime.   multivitamin with minerals Tabs tablet Take 1 tablet by mouth daily.   omega-3 acid ethyl esters 1 g capsule Commonly known as: Lovaza Take 2 capsules (2 g total) by mouth 2 (two) times daily.   omeprazole 20 MG tablet Commonly known as: PRILOSEC OTC Take 1 tablet (20 mg total) by mouth 2 (two) times daily.   sertraline 100 MG tablet Commonly known as: ZOLOFT Take 1 tablet (100 mg total) by mouth daily. What changed: how much to take   spironolactone 25 MG tablet Commonly known as: ALDACTONE Take 0.5 tablets (12.5 mg total) by mouth daily. Start taking on: September 07, 2019   warfarin 5 MG tablet Commonly known as: COUMADIN Take 10 mg daily except only take 7.5  mg on Mondays, Wednesdays and Fridays       Disposition   The patient will be discharged in stable condition to home. Discharge Instructions    Amb Referral to Cardiac Rehabilitation   Complete by: As directed    Diagnosis: STEMI   After initial evaluation and assessments completed: Virtual Based Care may be provided alone or in conjunction with Phase 2 Cardiac Rehab based on patient barriers.: Yes      Follow-up Information    [facility]. Go on 09/19/2019.   Why: You have a followup appointment scheduled for Monday, August 23 at 1030. Contact information: [place] [phone] [facility] Follow up on 09/12/2019.   Specialty: Cardiology Why: 3:30 PM Advanced Heart Failure [place]  Contact information: [address] [id number] [place] [phone]                Duration of Discharge Encounter: Greater than 35 minutes   Signed, Lyda Jester, PA-C 09/06/2019, 4:01 PM

## 2019-09-06 NOTE — Progress Notes (Signed)
   09/06/19 0434  Assess: MEWS Score  Pulse Rate 78  ECG Heart Rate 81  Resp (!) 28  SpO2 94 %  Assess: MEWS Score  MEWS Temp 0  MEWS Systolic 1  MEWS Pulse 0  MEWS RR 2  MEWS LOC 0  MEWS Score 3  MEWS Score Color Yellow  Assess: if the MEWS score is Yellow or Red  Were vital signs taken at a resting state? No  Focused Assessment No change from prior assessment  Early Detection of Sepsis Score *See Row Information* Low  MEWS guidelines implemented *See Row Information* No, vital signs rechecked  Document  Patient Outcome Other (Comment)  Progress note created (see row info) Yes  Respirations counted incorrectly by monitor. No intervention necessary.

## 2019-09-06 NOTE — Progress Notes (Addendum)
Patient ID: Craig Flores, male   DOB: 1965-11-06, 54 y.o.   MRN: [id number]     Advanced Heart Failure Rounding Note  PCP-Cardiologist: Quay Burow, MD   Subjective:     Feels well today. Just ambulated w/ CR and did well w/o any exertional dyspnea or CP.   Co-ox 66%.  LifeVest delivered yesterday.    Objective:   Weight Range: 82.3 kg Body mass index is 26.8 kg/m.   Vital Signs:   Temp:  [97.8 F (36.6 C)-98.5 F (36.9 C)] 97.8 F (36.6 C) (08/10 0800) Pulse Rate:  [65-87] 80 (08/10 0801) Resp:  [14-28] 19 (08/10 0800) BP: (93-100)/(62-77) 100/66 (08/10 0801) SpO2:  [89 %-96 %] 94 % (08/10 0800) Weight:  [82.3 kg] 82.3 kg (08/10 0259) Last BM Date: 09/04/19  Weight change: Filed Weights   09/04/19 0103 09/05/19 0100 09/06/19 0259  Weight: 83 kg 83 kg 82.3 kg    Intake/Output:   Intake/Output Summary (Last 24 hours) at 09/06/2019 0954 Last data filed at 09/06/2019 0815 Gross per 24 hour  Intake 2321.41 ml  Output 2750 ml  Net -428.59 ml      Physical Exam    PHYSICAL EXAM: General:  Well appearing. No respiratory difficulty HEENT: normal Neck: supple. no JVD. Carotids 2+ bilat; no bruits. No lymphadenopathy or thyromegaly appreciated. Cor: PMI nondisplaced. Regular rate & rhythm. No rubs, gallops or murmurs. Lungs: clear Abdomen: soft, nontender, nondistended. No hepatosplenomegaly. No bruits or masses. Good bowel sounds. Extremities: no cyanosis, clubbing, rash, edema Neuro: alert & oriented x 3, cranial nerves grossly intact. moves all 4 extremities w/o difficulty. Affect pleasant.   Telemetry   NSR mid 80s (personally reviewed)  Labs    CBC Recent Labs    09/05/19 0524 09/06/19 0500  WBC 8.9 9.5  HGB 12.9* 12.4*  HCT 38.9* 38.1*  MCV 91.7 93.2  PLT 303 599   Basic Metabolic Panel Recent Labs    09/04/19 0540 09/05/19 0524  NA 140 138  K 4.2 4.5  CL 103 104  CO2 23 20*  GLUCOSE 97 100*  BUN 13 13  CREATININE 0.95 1.01    CALCIUM 9.1 9.1   Liver Function Tests No results for input(s): AST, ALT, ALKPHOS, BILITOT, PROT, ALBUMIN in the last 72 hours. No results for input(s): LIPASE, AMYLASE in the last 72 hours. Cardiac Enzymes No results for input(s): CKTOTAL, CKMB, CKMBINDEX, TROPONINI in the last 72 hours.  BNP: BNP (last 3 results) Recent Labs    09/02/19 0537  BNP 1,396.1*    ProBNP (last 3 results) No results for input(s): PROBNP in the last 8760 hours.   D-Dimer No results for input(s): DDIMER in the last 72 hours. Hemoglobin A1C No results for input(s): HGBA1C in the last 72 hours. Fasting Lipid Panel No results for input(s): CHOL, HDL, LDLCALC, TRIG, CHOLHDL, LDLDIRECT in the last 72 hours. Thyroid Function Tests No results for input(s): TSH, T4TOTAL, T3FREE, THYROIDAB in the last 72 hours.  Invalid input(s): FREET3  Other results:   Imaging    No results found.   Medications:     Scheduled Medications: . aspirin  81 mg Oral Daily  . atorvastatin  80 mg Oral Daily  . carvedilol  3.125 mg Oral BID WC  . Chlorhexidine Gluconate Cloth  6 each Topical Daily  . colchicine  0.6 mg Oral Daily  . digoxin  0.125 mg Oral Daily  . icosapent Ethyl  2 g Oral BID  . losartan  12.5  mg Oral BID  . mirtazapine  30 mg Oral QHS  . pantoprazole  40 mg Oral BID  . sertraline  150 mg Oral Daily  . sodium chloride flush  3 mL Intravenous Q12H  . spironolactone  12.5 mg Oral Daily  . Warfarin - Pharmacist Dosing Inpatient   Does not apply q1600    Infusions: . sodium chloride    . heparin 2,050 Units/hr (09/06/19 0508)    PRN Medications: sodium chloride, acetaminophen, morphine injection, ondansetron (ZOFRAN) IV, sodium chloride flush    Assessment/Plan   1. CAD: Acute anterior MI, delayed presentation with no ischemic pain, had pleuritic-type chest pain. LHC with totally occluded mid LAD.  Suspected completed MI, no intervention.   - Continue ASA 81.  - on Coumadin w/  heparin bridge for LV thrombus. INR therapeutic today at 2.0. Stop IV heparin - Atorvastatin 80 mg daily. LDL goal < 70. Unable to afford Vesepa w/o insurance. Will change to Lovaza   2. Chest pain: Patient has mild pleuritic chest pain, improving.  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  - Continue Colchicine 0.6 mg daily x 3 months.  3. LV thrombus: Large LV thrombus.   - On warfarin, INR 2.0 today. Stop IV heparin  4. Acute on chronic systolic CHF: Echo with EF 30%, peri-apical akinesis, mildly decreased RV function with akinetic RV apex, moderate AS.  There was concern for cardiogenic shock but he has stabilized.  Co-ox 66% today. Euvolemic on exam. No exertional dyspnea ambulating w/ cardiac rehab.  - Continue digoxin 0.125 daily.  - Continue losartan 12.5 mg bid. BP too soft for Entresto  - spironolactone 12.5 daily.   - Coreg 3.125 mg bid.  - Will d/c home w/ PRN Lasix 20 mg  - Will send home w/ LifeVest. Repeat echo after 1 month (no revascularization) and ICD if EF remains < 35%.  5. Active smoker: Needs to quit.   Plan d/c home today. Will arrange post hospital f/u in [facility] and repeat echo in 1 month. His INRs/ coumadin will be monitored/ managed by [facility]    Length of Stay: [id number], PA-C  09/06/2019, 9:54 AM  Advanced Heart Failure Team Pager [phone] (M-F; 7a - 4p)  Please contact [facility] for night-coverage after hours (4p -7a ) and weekends on [email or web address]  Agree with the above note.   Patient is ready for discharge today.  INR is 2, can stop heparin gtt.  He will need followup in coumadin clinic and CHF clinic.  He will have repeat echo in 1 month to decide on ICD, continue Lifevest until then.   Loralie Champagne 09/06/2019

## 2019-09-08 ENCOUNTER — Other Ambulatory Visit (HOSPITAL_COMMUNITY): Payer: Self-pay

## 2019-09-08 DIAGNOSIS — I5022 Chronic systolic (congestive) heart failure: Secondary | ICD-10-CM

## 2019-09-12 ENCOUNTER — Encounter (HOSPITAL_COMMUNITY): Payer: Self-pay

## 2019-09-12 ENCOUNTER — Ambulatory Visit (HOSPITAL_COMMUNITY)
Admit: 2019-09-12 | Discharge: 2019-09-12 | Disposition: A | Discharge status: Home / Self Care | Payer: Medicaid Other | Attending: Internal Medicine | Admitting: Internal Medicine

## 2019-09-12 ENCOUNTER — Other Ambulatory Visit: Payer: Self-pay

## 2019-09-12 VITALS — BP 104/80 | Ht 69.0 in | Wt 181.0 lb

## 2019-09-12 DIAGNOSIS — K219 Gastro-esophageal reflux disease without esophagitis: Secondary | ICD-10-CM | POA: Insufficient documentation

## 2019-09-12 DIAGNOSIS — I2109 ST elevation (STEMI) myocardial infarction involving other coronary artery of anterior wall: Secondary | ICD-10-CM | POA: Diagnosis not present

## 2019-09-12 DIAGNOSIS — Z7901 Long term (current) use of anticoagulants: Secondary | ICD-10-CM | POA: Insufficient documentation

## 2019-09-12 DIAGNOSIS — I5020 Unspecified systolic (congestive) heart failure: Secondary | ICD-10-CM | POA: Diagnosis not present

## 2019-09-12 DIAGNOSIS — F329 Major depressive disorder, single episode, unspecified: Secondary | ICD-10-CM | POA: Diagnosis not present

## 2019-09-12 DIAGNOSIS — Z79899 Other long term (current) drug therapy: Secondary | ICD-10-CM | POA: Insufficient documentation

## 2019-09-12 DIAGNOSIS — Z87891 Personal history of nicotine dependence: Secondary | ICD-10-CM | POA: Diagnosis not present

## 2019-09-12 DIAGNOSIS — I2510 Atherosclerotic heart disease of native coronary artery without angina pectoris: Secondary | ICD-10-CM | POA: Insufficient documentation

## 2019-09-12 DIAGNOSIS — I513 Intracardiac thrombosis, not elsewhere classified: Secondary | ICD-10-CM | POA: Diagnosis not present

## 2019-09-12 DIAGNOSIS — I255 Ischemic cardiomyopathy: Secondary | ICD-10-CM | POA: Insufficient documentation

## 2019-09-12 DIAGNOSIS — Z7982 Long term (current) use of aspirin: Secondary | ICD-10-CM | POA: Insufficient documentation

## 2019-09-12 DIAGNOSIS — I5021 Acute systolic (congestive) heart failure: Secondary | ICD-10-CM

## 2019-09-12 DIAGNOSIS — E782 Mixed hyperlipidemia: Secondary | ICD-10-CM

## 2019-09-12 LAB — BASIC METABOLIC PANEL
Anion gap: 13 (ref 5–15)
BUN: 25 mg/dL — ABNORMAL HIGH (ref 6–20)
CO2: 21 mmol/L — ABNORMAL LOW (ref 22–32)
Calcium: 9.5 mg/dL (ref 8.9–10.3)
Chloride: 101 mmol/L (ref 98–111)
Creatinine, Ser: 0.86 mg/dL (ref 0.61–1.24)
GFR calc Af Amer: >60 mL/min (ref >60)
GFR calc non Af Amer: >60 mL/min (ref >60)
Glucose, Bld: 98 mg/dL (ref 70–99)
Potassium: 4.6 mmol/L (ref 3.5–5.1)
Sodium: 135 mmol/L (ref 135–145)

## 2019-09-12 LAB — CBC
HCT: 40.8 % (ref 39.0–52.0)
Hemoglobin: 13.5 g/dL (ref 13.0–17.0)
MCH: 29.7 pg (ref 26.0–34.0)
MCHC: 33.1 g/dL (ref 30.0–36.0)
MCV: 89.9 fL (ref 80.0–100.0)
Platelets: 356 10*3/uL (ref 150–400)
RBC: 4.54 MIL/uL (ref 4.22–5.81)
RDW: 12.5 % (ref 11.5–15.5)
WBC: 7 10*3/uL (ref 4.0–10.5)
nRBC: 0 % (ref 0.0–0.2)

## 2019-09-12 LAB — PROTIME-INR
INR: 1.9 — ABNORMAL HIGH (ref 0.8–1.2)
Prothrombin Time: 21 seconds — ABNORMAL HIGH (ref 11.4–15.2)

## 2019-09-12 LAB — DIGOXIN LEVEL: Digoxin Level: 0.4 ng/mL — ABNORMAL LOW (ref 0.8–2.0)

## 2019-09-12 NOTE — Patient Instructions (Signed)
Routine lab work today. Will notify you of abnormal results  Follow up in 2 weeks.

## 2019-09-12 NOTE — Progress Notes (Signed)
Advanced Heart Failure Clinic Note   Referring Physician: PCP: Patient, No Pcp Per PCP-Cardiologist: Quay Burow, MD  [facility]: Dr. Jeffie Pollock  Reason for Visit: [facility] f/u for Ischemic CM/ Systolic Heart Failure, Post Anterior MI.    HPI:  54 y/o male smoker w/ no prior cardiac history, admitted on 08/28/19 for delayed presentation anterior STEMI. Had had >36 hr of chest pain prior to seeking medical attention. Pain progressed to more pleuritic like CP. Found to have anterior ST elevations w/ precordial Q waves on admit. Hs troponin >27,000. Urgent cardiac cath showed totally occluded mid LAD w/o collaterals and 40% mid RCA stenosis. It was suspected he had completed his infarct and residual pleuritic CP was post MI pericarditis. No intervention was performed. 2D echo demonstrated moderately reduced LVEF, 45-50%, + apical thrombus and moderate AS. MV normal. RV systolic function mildly reduced.   He was placed on medical management for CAD and systolic HF. He was started on ASA, atorvastatin, Losartan and Coreg. Coumadin started for apical thrombus w/ heparin bridge.   Post cath, there were initial concerns for developing cardiogenic shock but he remained stable and did not require pressor/inotropic support. Co-ox remained stable. Repeat echo showed further reduction of LVEF down to 30-35% but no post MI mechanical complications. RV mildly reduced. He did require IV Lasix for pulmonary edema and volume status and dyspnea improved w/ diuresis. He was continued on GTDMT w/ losartan, spironolactone and Coreg. BP too soft for Entresto. He was treated w/ colchicine for post MI pericarditis w/ improvement in pleuritic chest pain. he will continue x 3 months. Given anterior MI w/ EF <35%, he was fitted w/ a LifeVest prior to discharge w/ plans to get repeat echo in 1 month. If EF remains < 35%, he will be referred to EP for potential ICD.    He presents to clinic today for f/u. Here with his wife.  Doing well. Denies any further CP. No dyspnea, LEE, orthopnea or PND. Denies wt gain. Has not had to use PRN lasix. Wearing Lifevest. Reports good compliance. No shocks or alarms. Checking BP reaguarlly at home. SBPs range from upper 90s-low 100s. BP today 104/80. No orthostatic symptoms. He has quit smoking. He denies abnormal bleeding w/ coumadin. His first appt at [place] is next week.    Review of Systems: [y] = yes, [ ]  = no   General: Weight gain [ ] ; Weight loss [ ] ; Anorexia [ ] ; Fatigue [ ] ; Fever [ ] ; Chills [ ] ; Weakness [ ]   Cardiac: Chest pain/pressure [ ] ; Resting SOB [ ] ; Exertional SOB [ ] ; Orthopnea [ ] ; Pedal Edema [ ] ; Palpitations [ ] ; Syncope [ ] ; Presyncope [ ] ; Paroxysmal nocturnal dyspnea[ ]   Pulmonary: Cough [ ] ; Wheezing[ ] ; Hemoptysis[ ] ; Sputum [ ] ; Snoring [ ]   GI: Vomiting[ ] ; Dysphagia[ ] ; Melena[ ] ; Hematochezia [ ] ; Heartburn[ ] ; Abdominal pain [ ] ; Constipation [ ] ; Diarrhea [ ] ; BRBPR [ ]   GU: Hematuria[ ] ; Dysuria [ ] ; Nocturia[ ]   Vascular: Pain in legs with walking [ ] ; Pain in feet with lying flat [ ] ; Non-healing sores [ ] ; Stroke [ ] ; TIA [ ] ; Slurred speech [ ] ;  Neuro: Headaches[ ] ; Vertigo[ ] ; Seizures[ ] ; Paresthesias[ ] ;Blurred vision [ ] ; Diplopia [ ] ; Vision changes [ ]   Ortho/Skin: Arthritis [ ] ; Joint pain [ ] ; Muscle pain [ ] ; Joint swelling [ ] ; Back Pain [ ] ; Rash [ ]   Psych: Depression[ ] ; Anxiety[ ]   Heme: Bleeding problems [ ] ; Clotting disorders [ ] ; Anemia [ ]   Endocrine: Diabetes [ ] ; Thyroid dysfunction[ ]    Past Medical History:  Diagnosis Date  . Acid reflux   . CHF (congestive heart failure) ([place])   . Depression   . Migraines   . Myocardial infarction [facility])     Current Outpatient Medications  Medication Sig Dispense Refill  . aspirin 81 MG chewable tablet Chew 1 tablet (81 mg total) by mouth daily. 30 tablet 11  . atorvastatin (LIPITOR) 80 MG tablet Take 1 tablet (80 mg total) by mouth daily. 30 tablet  5  . carvedilol (COREG) 3.125 MG tablet Take 1 tablet (3.125 mg total) by mouth 2 (two) times daily with a meal. 60 tablet 5  . colchicine 0.6 MG tablet Take 1 tablet (0.6 mg total) by mouth daily. 30 tablet 2  . digoxin (LANOXIN) 0.125 MG tablet Take 1 tablet (0.125 mg total) by mouth daily. 30 tablet 5  . furosemide (LASIX) 20 MG tablet Take 1 tablet (20 mg total) by mouth as needed for fluid or edema (take if you gain more than 5 lb in a week). 30 tablet 11  . losartan (COZAAR) 25 MG tablet Take 0.5 tablets (12.5 mg total) by mouth 2 (two) times daily. 30 tablet 5  . mirtazapine (REMERON) 30 MG tablet Take 1 tablet (30 mg total) by mouth at bedtime. 30 tablet 0  . Multiple Vitamin (MULTIVITAMIN WITH MINERALS) TABS tablet Take 1 tablet by mouth daily.    Marland Kitchen omega-3 acid ethyl esters (LOVAZA) 1 g capsule Take 2 capsules (2 g total) by mouth 2 (two) times daily. 60 capsule 5  . omeprazole (PRILOSEC OTC) 20 MG tablet Take 1 tablet (20 mg total) by mouth 2 (two) times daily.    . sertraline (ZOLOFT) 100 MG tablet Take 1 tablet (100 mg total) by mouth daily. (Patient taking differently: Take 150 mg by mouth daily. ) 30 tablet 0  . spironolactone (ALDACTONE) 25 MG tablet Take 0.5 tablets (12.5 mg total) by mouth daily. 30 tablet 5  . warfarin (COUMADIN) 5 MG tablet Take 10 mg daily except only take 7.5 mg on Mondays, Wednesdays and Fridays 90 tablet 0   No current facility-administered medications for this encounter.    No Known Allergies    Social History   Socioeconomic History  . Marital status: Married    Spouse name: Not on file  . Number of children: Not on file  . Years of education: Not on file  . Highest education level: Not on file  Occupational History  . Not on file  Tobacco Use  . Smoking status: Former Smoker    Types: Cigarettes    Quit date: 09/05/2019    Years since quitting: 0.0  . Smokeless tobacco: Never Used  . Tobacco comment: x15 yrs as of 2021  Vaping Use  .  Vaping Use: Never used  Substance and Sexual Activity  . Alcohol use: Not Currently    Comment: previous use but has not drank in months  . Drug use: Never  . Sexual activity: Yes    Birth control/protection: None  Other Topics Concern  . Not on file  Social History Narrative  . Not on file   Social Determinants of Health   Financial Resource Strain:   . Difficulty of Paying Living Expenses:   Food Insecurity:   . Worried About Charity fundraiser in the Last Year:   . [employer] of  Food in the Last Year:   Transportation Needs:   . Film/video editor (Medical):   Marland Kitchen Lack of Transportation (Non-Medical):   Physical Activity:   . Days of Exercise per Week:   . Minutes of Exercise per Session:   Stress:   . Feeling of Stress :   Social Connections:   . Frequency of Communication with Friends and Family:   . Frequency of Social Gatherings with Friends and Family:   . Attends Religious Services:   . Active Member of Clubs or Organizations:   . Attends Archivist Meetings:   Marland Kitchen Marital Status:   Intimate Partner Violence:   . Fear of Current or Ex-Partner:   . Emotionally Abused:   Marland Kitchen Physically Abused:   . Sexually Abused:       Family History  Problem Relation Age of Onset  . Arrhythmia Father     Vitals:   09/12/19 1534  BP: 104/80  Weight: 82.1 kg (181 lb)  Height: 5\' 9"  (1.753 m)     PHYSICAL EXAM: General:  Well appearing. No respiratory difficulty HEENT: normal Neck: supple. no JVD. Carotids 2+ bilat; no bruits. No lymphadenopathy or thyromegaly appreciated. Cor: PMI nondisplaced. Regular rate & rhythm. No rubs, gallops or murmurs. Lungs: clear Abdomen: soft, nontender, nondistended. No hepatosplenomegaly. No bruits or masses. Good bowel sounds. Extremities: no cyanosis, clubbing, rash, edema +deformity of left hand Neuro: alert & oriented x 3, cranial nerves grossly intact. moves all 4 extremities w/o difficulty. Affect pleasant.  ECG: not  performed.    ASSESSMENT & PLAN:  1. CAD: recent admit 8/21 for acute anterior MI, delayed presentation with no ischemic pain, had pleuritic-type chest pain (post-MI pericarditis). LHC with totally occluded mid LAD w/o collaterals. Suspected completed MI, no intervention. Plan medical management. He is stable w/o any further CP.  - Continue ASA 81 mg daily  - Continue  blocker therapy w/ Coreg, 3.125 mg bid - Continue losartan + spironolactone w/ LV dysfunction  - Continue high potency statin, Atorvastatin 80 mg daily.LDL goal < 70.  - place referral for outpatient cardiac rehab   - he has quit smoking  2. Systolic CHF/ Ischemic Cardiomyopathy: reduced EF post anterior MI, Echo with EF 30%, peri-apical akinesis, mildly decreased RV function with akinetic RV apex, moderate AS. Cath w/ occluded mLAD but no intervention, as he had completed his infarct. NYHA Class II. Euvolemic on exam. BP soft but stable. No room for med titration today.  - Continue losartan 12.5 mg bid.BP too soft for Entresto - Continue spironolactone 12.5 daily. - Continue Coreg 3.125 mg bid. - Continue digoxin 0.125 daily. Check Dig level today  - PRN Lasix 20 mgfor wt gain > 3 lb in 24 hr or > 5 lb in 1 week  -Continue w/ LifeVest until repeat echo in ~1 month (no revascularization). Echo scheduled 9/13.  - Will refer to EP for ICD if EF remains <35%.  3. Post MI Pericarditis: Pleuritic CP resolved.  -ContinueColchicine 0.6 mg dailyx 3 months.  4. 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 Will check INR today but [facility] will follow long term.  5. H/o Tobacco Abuse: he has quit smoking 6. HLD: recent lipid panel w/ direct LDL 133, TG 495.  - LDL goal < 70, TG goal <150 w/ CAD  - now on atorvastatin 80 mg qhs. + Lovaza (unable to afford Vesepa w/o insurance) - repeat FLP and HFTs in 6-8 weeks.  F/u w/ PharmD or APP in 2 weeks to try to titrate meds, if BP will allow.     Lyda Jester, PA-C 09/12/19

## 2019-09-15 NOTE — Progress Notes (Signed)
Patient ID: Craig Flores, male   DOB: [birth year], 54 y.o.   MRN: [id number]   Virtual Visit via Telephone Note  I connected with Craig Flores on 09/19/19 at 10:30 AM EDT by telephone and verified that I am speaking with the correct person using two identifiers.   I discussed the limitations, risks, security and privacy concerns of performing an evaluation and management service by telephone and the availability of in person appointments. I also discussed with the patient that there may be a patient responsible charge related to this service. The patient expressed understanding and agreed to proceed.  PATIENT visit by telephone virtually in the context of Covid-19 pandemic. Patient location:  home My Location:  [facility] office Persons on the call:  Me and the patient    History of Present Illness: After hospitalization 8/1-8/10/2019 for STEMI.  No further CP.  No SOB.  Aware of and verbalizes cardiology appts that are coming up.  He has plenty of RF on all of his medication bottles.  He is feeling good overall.  He is walking for exercise without CP or SOB.    From discharge summary: Primary Discharge Diagnoses:  Anterior STEMI/ CAD Acute Systolic Heart Failure Ischemic Cardiomyopathy  Post -MI Pericarditis Hyperlipidemia LV Thrombus Anticoagulation Therapy w/ Coumadin  Tobacco Abuse    Hospital Course:   54 y/o male smoker w/ no prior cardiac history, admitted on 08/28/19 for delayed presentation anterior STEMI. Had had >36 hr of chest pain prior to seeking medical attention. Pain progressed to more pleuritic like CP. Found to have anterior ST elevations w/ precordial Q waves on admit. Hs troponin >27,000. Urgent cardiac cath showed totally occluded mid LAD w/o collaterals and 40% mid RCA stenosis. It was suspected he had completed his infarct and residual pleuritic CP was post MI pericarditis. No intervention was performed. 2D echo demonstrated moderately reduced LVEF, 45-50%, +  apical thrombus and moderate AS. MV normal. RV systolic function mildly reduced.   He was placed on medical management for CAD and systolic HF. He was started on ASA, atorvastatin, Losartan and Coreg. Coumadin started for apical thrombus w/ heparin bridge.   Post cath, there were initial concerns for developing cardiogenic shock but he remained stable and did not require pressor/inotropic support. Co-ox remained stable. Repeat echo showed further reduction of LVEF down to 30-35% but no post MI mechanical complications. RV mildly reduced. He did require IV Lasix for pulmonary edema and volume status and dyspnea improved w/ diuresis. He was continued on GTDMT w/ losartan, spironolactone and Coreg. BP too soft for Entresto. Given anterior MI w/ EF <35%, he was fitted w/ a LifeVest prior to discharge w/ plans to get repeat echo in 1 month. If EF remains < 35%, he will be referred to EP for potential ICD.    Also of note, he developed pleuritic chest pain that was felt to be post-MI pericarditis. He was started on colchicine w/ improvement in symptoms. Will continue x 3 months.   Given LV thrombus he was placed on IV heparin until INR reached therapeutic range on coumadin. INR 2.0 on discharge. INRs and coumadin dosing will be followed/ managed by [facility].   On 8/10, he was seen and examined by Dr. Aundra Dubin and felt stable for discharge home.  Post hospital f/u will be arranged in the [facility] and at [facility].   See Detailed Hospital Problem List Below 1. CAD: Acute anterior MI, delayed presentation with no ischemic pain, had  pleuritic-type chest pain. LHC with totally occluded mid LAD. Suspected completed MI, no intervention.  - Continue ASA 81.  -on Coumadin w/ heparin bridge forLV thrombus. INR therapeutic today at 2.0. Stop IV heparin - Atorvastatin 80 mg daily.LDL goal < 70. Unable to afford Vesepa w/o insurance. Will change to Lovaza 2. Chest pain: Patient has mild  pleuritic chest pain, improving. 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  -ContinueColchicine 0.6 mg dailyx 3 months.  3. LV thrombus: Large LV thrombus.  -Onwarfarin, INR 2.0today.Stop IV heparin 4. Acute on chronic systolic CHF: Echo with EF 30%, peri-apical akinesis, mildly decreased RV function with akinetic RV apex, moderate AS. There was concern for cardiogenic shock but he has stabilized. Co-ox66% today.Euvolemic on exam. No exertional dyspnea ambulating w/ cardiac rehab. - Continue digoxin 0.125 daily.  - Continue losartan 12.5 mg bid.BP too soft for Entresto - spironolactone 12.5 daily.  - Coreg 3.125 mg bid. - Will d/c home w/ PRN Lasix 20 mg -Will send home w/ LifeVest. Repeat echo after 1 month (no revascularization) and ICD if EF remains <35%.  5. Active smoker: Needs to quit.  Plan d/c home today. Will arrange post hospital f/u in [facility] repeat echo in 1 month.His INRs/ coumadin will be monitored/ managed by [facility]    Observations/Objective: NAD.  A&Ox3  Assessment and Plan: 1. ST elevation myocardial infarction (STEMI), unspecified artery (HCC) No CP.  Keep f/up cardiology.  Continue current medication regimen  2. Acute systolic CHF (congestive heart failure) ([place]) Weights stable-No CP.  Keep f/up cardiology.  Continue current medication regimen  3. Hospital discharge follow-up Doing well    Follow Up Instructions: Assign PCP here in 6-8 weeks;  Sooner if needed   I discussed the assessment and treatment plan with the patient. The patient was provided an opportunity to ask questions and all were answered. The patient agreed with the plan and demonstrated an understanding of the instructions.   The patient was advised to call back or seek an in-person evaluation if the symptoms worsen or if the condition fails to improve as anticipated.  I provided 13 minutes of non-face-to-face time during this encounter.   Freeman Caldron, PA-C

## 2019-09-19 ENCOUNTER — Ambulatory Visit: Payer: Self-pay | Attending: Physician Assistant | Admitting: Physician Assistant

## 2019-09-19 DIAGNOSIS — I5021 Acute systolic (congestive) heart failure: Secondary | ICD-10-CM

## 2019-09-19 DIAGNOSIS — Z09 Encounter for follow-up examination after completed treatment for conditions other than malignant neoplasm: Secondary | ICD-10-CM

## 2019-09-19 DIAGNOSIS — I213 ST elevation (STEMI) myocardial infarction of unspecified site: Secondary | ICD-10-CM

## 2019-09-20 ENCOUNTER — Telehealth (HOSPITAL_COMMUNITY): Payer: Self-pay

## 2019-09-20 ENCOUNTER — Encounter (HOSPITAL_COMMUNITY): Payer: Self-pay

## 2019-09-20 NOTE — Telephone Encounter (Signed)
Attempted to call patient in regards to Cardiac Rehab - LM on VM Mailed letter 

## 2019-09-21 ENCOUNTER — Encounter (HOSPITAL_COMMUNITY): Payer: Self-pay

## 2019-09-22 MED FILL — OMEGA-3 ETHYL ESTERS 1 GM C: 1 | 30 days supply | Qty: 120 | Fill #0

## 2019-09-28 ENCOUNTER — Other Ambulatory Visit: Payer: Self-pay

## 2019-09-28 ENCOUNTER — Ambulatory Visit (HOSPITAL_COMMUNITY)
Admission: RE | Admit: 2019-09-28 | Discharge: 2019-09-28 | Disposition: A | Discharge status: Home / Self Care | Payer: Medicaid Other | Source: Ambulatory Visit | Attending: Cardiology | Admitting: Cardiology

## 2019-09-28 ENCOUNTER — Encounter (HOSPITAL_COMMUNITY): Payer: Self-pay

## 2019-09-28 ENCOUNTER — Telehealth: Payer: Self-pay | Admitting: *Deleted

## 2019-09-28 ENCOUNTER — Ambulatory Visit (INDEPENDENT_AMBULATORY_CARE_PROVIDER_SITE_OTHER): Payer: Medicaid Other | Admitting: Cardiology

## 2019-09-28 ENCOUNTER — Telehealth (HOSPITAL_COMMUNITY): Payer: Self-pay

## 2019-09-28 VITALS — BP 104/76 | HR 85 | Wt 181.4 lb

## 2019-09-28 DIAGNOSIS — I2510 Atherosclerotic heart disease of native coronary artery without angina pectoris: Secondary | ICD-10-CM

## 2019-09-28 DIAGNOSIS — Z7901 Long term (current) use of anticoagulants: Secondary | ICD-10-CM | POA: Diagnosis not present

## 2019-09-28 DIAGNOSIS — Z5181 Encounter for therapeutic drug level monitoring: Secondary | ICD-10-CM

## 2019-09-28 DIAGNOSIS — I5021 Acute systolic (congestive) heart failure: Secondary | ICD-10-CM | POA: Diagnosis not present

## 2019-09-28 DIAGNOSIS — I236 Thrombosis of atrium, auricular appendage, and ventricle as current complications following acute myocardial infarction: Secondary | ICD-10-CM

## 2019-09-28 DIAGNOSIS — I513 Intracardiac thrombosis, not elsewhere classified: Secondary | ICD-10-CM | POA: Diagnosis not present

## 2019-09-28 DIAGNOSIS — I5022 Chronic systolic (congestive) heart failure: Secondary | ICD-10-CM

## 2019-09-28 LAB — PROTIME-INR
INR: 3.3 — ABNORMAL HIGH (ref 0.8–1.2)
Prothrombin Time: 32.8 seconds — ABNORMAL HIGH (ref 11.4–15.2)

## 2019-09-28 MED ORDER — LOSARTAN POTASSIUM 25 MG PO TABS
25.0000 mg | ORAL_TABLET | Freq: Two times a day (BID) | ORAL | 5 refills | Status: DC
Start: 2019-09-28 — End: 2019-11-17

## 2019-09-28 MED FILL — LOSARTAN POTASSIUM 25 MG TA: 25 | 30 days supply | Qty: 60 | Fill #0

## 2019-09-28 NOTE — Patient Instructions (Signed)
Description   Spoke to pt instructed for him to hold warfarin today (9/1), then continue to take 2 tablets (10mg ) daily except for 1.5 tablets Monday, Wednesday and Friday. Recheck INR in 1 week. Call coumadin clinic for any changes in medications or upcoming procedures. [phone].  A full discussion of the nature of anticoagulants has been carried out.  A benefit risk analysis has been presented to the patient, so that they understand the justification for choosing anticoagulation at this time. The need for frequent and regular monitoring, precise dosage adjustment and compliance is stressed.  Side effects of potential bleeding are discussed.  The patient should avoid any OTC items containing aspirin or ibuprofen, and should avoid great swings in general diet.  Avoid alcohol consumption.  Call if any signs of abnormal bleeding.

## 2019-09-28 NOTE — Patient Instructions (Addendum)
You have been referred to the coumadin clinic they will contact you to schedule your appointment.  You have been referred to cardiac rehab they will contact you to schedule you appointment.  Routine lab work today. Will notify you of abnormal results  INCREASE Losartan to 25mg  twice daily  Repeat labs in 7 days.   Follow up with Dr.McLean in 6 weeks.

## 2019-09-28 NOTE — Telephone Encounter (Signed)
No response from pt regarding CR.  Closed referral.  

## 2019-09-28 NOTE — Progress Notes (Signed)
Advanced Heart Failure Clinic Note   Referring Physician: PCP: Patient, No Pcp Per PCP-Cardiologist: Quay Burow, MD  [facility]: Dr. Aundra Dubin  Reason for Visit: Heart Failure    HPI: 54 y/o male smoker w/ no prior cardiac history, admitted on 08/28/19 for delayed presentation anterior STEMI. Had had >36 hr of chest pain prior to seeking medical attention. Pain progressed to more pleuritic like CP. Found to have anterior ST elevations w/ precordial Q waves on admit. Hs troponin >27,000. Urgent cardiac cath showed totally occluded mid LAD w/o collaterals and 40% mid RCA stenosis. It was suspected he had completed his infarct and residual pleuritic CP was post MI pericarditis. No intervention was performed. 2D echo demonstrated moderately reduced LVEF, 45-50%, + apical thrombus and moderate AS. MV normal. RV systolic function mildly reduced.   He was placed on medical management for CAD and systolic HF. He was started on ASA, atorvastatin, Losartan and Coreg. Coumadin started for apical thrombus w/ heparin bridge.   Post cath, there were initial concerns for developing cardiogenic shock but he remained stable and did not require pressor/inotropic support. Co-ox remained stable. Repeat echo showed further reduction of LVEF down to 30-35% but no post MI mechanical complications. RV mildly reduced. He did require IV Lasix for pulmonary edema and volume status and dyspnea improved w/ diuresis. He was continued on GTDMT w/ losartan, spironolactone and Coreg. BP too soft for Entresto. He was treated w/ colchicine for post MI pericarditis w/ improvement in pleuritic chest pain. he will continue x 3 months. Given anterior MI w/ EF <35%, he was fitted w/ a LifeVest prior to discharge w/ plans to get repeat echo in 1 month. If EF remains < 35%, he will be referred to EP for potential ICD.    Today he returns for HF follow up.Overall feeling fine. SBP at home 90-100. Mild SOB with inclines. Denies PND/Orthopnea. No  chest pain. Wearing Life Vest. Walking 4-5 days a week. Appetite ok. No fever or chills. Weight at home 173-`174  pounds. Taking all medications .      Past Medical History:  Diagnosis Date   Acid reflux    CHF (congestive heart failure) (HCC)    Depression    Migraines    Myocardial infarction [facility])     Current Outpatient Medications  Medication Sig Dispense Refill   aspirin 81 MG chewable tablet Chew 1 tablet (81 mg total) by mouth daily. 30 tablet 11   atorvastatin (LIPITOR) 80 MG tablet Take 1 tablet (80 mg total) by mouth daily. 30 tablet 5   carvedilol (COREG) 3.125 MG tablet Take 1 tablet (3.125 mg total) by mouth 2 (two) times daily with a meal. 60 tablet 5   colchicine 0.6 MG tablet Take 1 tablet (0.6 mg total) by mouth daily. 30 tablet 2   digoxin (LANOXIN) 0.125 MG tablet Take 1 tablet (0.125 mg total) by mouth daily. 30 tablet 5   furosemide (LASIX) 20 MG tablet Take 1 tablet (20 mg total) by mouth as needed for fluid or edema (take if you gain more than 5 lb in a week). 30 tablet 11   losartan (COZAAR) 25 MG tablet Take 0.5 tablets (12.5 mg total) by mouth 2 (two) times daily. 30 tablet 5   Multiple Vitamin (MULTIVITAMIN WITH MINERALS) TABS tablet Take 1 tablet by mouth daily.     omega-3 acid ethyl esters (LOVAZA) 1 g capsule Take 2 capsules (2 g total) by mouth 2 (two) times daily. 60 capsule 5  omeprazole (PRILOSEC OTC) 20 MG tablet Take 1 tablet (20 mg total) by mouth 2 (two) times daily.     sertraline (ZOLOFT) 100 MG tablet Take 1 tablet (100 mg total) by mouth daily. (Patient taking differently: Take 150 mg by mouth daily. ) 30 tablet 0   spironolactone (ALDACTONE) 25 MG tablet Take 0.5 tablets (12.5 mg total) by mouth daily. 30 tablet 5   warfarin (COUMADIN) 5 MG tablet Take 10 mg daily except only take 7.5 mg on Mondays, Wednesdays and Fridays 90 tablet 0   No current facility-administered medications for this encounter.    No Known  Allergies    Social History   Socioeconomic History   Marital status: Married    Spouse name: Not on file   Number of children: Not on file   Years of education: Not on file   Highest education level: Not on file  Occupational History   Not on file  Tobacco Use   Smoking status: Former Smoker    Types: Cigarettes    Quit date: 09/05/2019    Years since quitting: 0.0   Smokeless tobacco: Never Used   Tobacco comment: x15 yrs as of 2021  Vaping Use   Vaping Use: Never used  Substance and Sexual Activity   Alcohol use: Not Currently    Comment: previous use but has not drank in months   Drug use: Never   Sexual activity: Yes    Birth control/protection: None  Other Topics Concern   Not on file  Social History Narrative   Not on file   Social Determinants of Health   Financial Resource Strain:    Difficulty of Paying Living Expenses: Not on file  Food Insecurity:    Worried About Charity fundraiser in the Last Year: Not on file   [place] in the Last Year: Not on file  Transportation Needs:    Lack of Transportation (Medical): Not on file   Lack of Transportation (Non-Medical): Not on file  Physical Activity:    Days of Exercise per Week: Not on file   Minutes of Exercise per Session: Not on file  Stress:    Feeling of Stress : Not on file  Social Connections:    Frequency of Communication with Friends and Family: Not on file   Frequency of Social Gatherings with Friends and Family: Not on file   Attends Religious Services: Not on file   Active Member of Clubs or Organizations: Not on file   Attends Archivist Meetings: Not on file   Marital Status: Not on file  Intimate Partner Violence:    Fear of Current or Ex-Partner: Not on file   Emotionally Abused: Not on file   Physically Abused: Not on file   Sexually Abused: Not on file      Family History  Problem Relation Age of Onset   Arrhythmia Father      Vitals:   09/28/19 1121  BP: 104/76  Pulse: 85  SpO2: 97%  Weight: 82.3 kg (181 lb 6 oz)   Wt Readings from Last 3 Encounters:  09/28/19 82.3 kg (181 lb 6 oz)  09/12/19 82.1 kg (181 lb)  09/06/19 82.3 kg (181 lb 8 oz)     PHYSICAL EXAM: General:  Well appearing. No respiratory difficulty. Wearing Life Vest.  HEENT: normal Neck: supple. no JVD. Carotids 2+ bilat; no bruits. No lymphadenopathy or thyromegaly appreciated. Cor: PMI nondisplaced. Regular rate & rhythm. No rubs, gallops  or murmurs. Lungs: clear Abdomen: soft, nontender, nondistended. No hepatosplenomegaly. No bruits or masses. Good bowel sounds. Extremities: no cyanosis, clubbing, rash, edema +deformity of left hand Neuro: alert & oriented x 3, cranial nerves grossly intact. moves all 4 extremities w/o difficulty. Affect pleasant.  EKG: NSR 86 bpm QRS 96 ms  ASSESSMENT & PLAN:  1. CAD: recent admit 8/21 for acute anterior MI, delayed presentation with no ischemic pain, had pleuritic-type chest pain (post-MI pericarditis). LHC with totally occluded mid LAD w/o collaterals. Suspected completed MI, no intervention. Plan medical management. -No chest pain.  - Continue ASA 81 mg daily  - Continue  blocker therapy w/ Coreg, 3.125 mg bid - Continue losartan + spironolactone w/ LV dysfunction  - Continue high potency statin, Atorvastatin 80 mg daily.LDL goal < 70.  - place referral for outpatient cardiac rehab    2. Systolic CHF/ Ischemic Cardiomyopathy: reduced EF post anterior MI, Echo with EF 30%, peri-apical akinesis, mildly decreased RV function with akinetic RV apex, moderate AS. Cath w/ occluded mLAD but no intervention, as he had completed his infarct.  -NYHA II.  - Increase losartan to 25 mg twice a day. BP too soft for Entresto.  - Continue  spironolactone 12.5 mg daily. - Continue Coreg 3.125 mg bid. - Continue digoxin 0.125 daily. Dig level 0.4 09/12/19  - PRN Lasix 20 mgfor wt gain > 3 lb in 24 hr  or > 5 lb in 1 week  -Continue w/ LifeVest until repeat echo. Echo scheduled 9/13.  - Will refer to EP for ICD if EF remains <35%.   3. Post MI Pericarditis: Pleuritic CP resolved.  -ContinueColchicine 0.6 mg dailyx 3 months.   4. 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 Check INR.  - Refer to Coumadin Clinic   -5. H/o Tobacco Abuse:   6. HLD: recent lipid panel w/ direct LDL 133, TG 495.  - LDL goal < 70, TG goal <150 w/ CAD  - now on atorvastatin 80 mg qhs. + Lovaza (unable to afford Vesepa w/o insurance) - repeat FLP and HFTs in 6-8 weeks.   Check BMET 7 days. Increase losartan 25 mg twice a day. Follow up in 6 weeks with Dr Aundra Dubin.   Darrick Grinder, NP 09/28/19   Patient seen and examined with the above-signed Advanced Practice Provider and/or Housestaff. I personally reviewed laboratory data, imaging studies and relevant notes. I independently examined the patient and formulated the important aspects of the plan. I have edited the note to reflect any of my changes or salient points. I have personally discussed the plan with the patient and/or family.  54 y/o male recently admitted with late presenting anterior STEMI with totally occluded LAD. EF 30-35%. Treated medically. LifeVest placed. Coumadin started for large LV thrombus  Now doing well. More active. Compliant with meds. No bleeding on coumadin. BP soft but asymptomatic.   General:  Well appearing. No resp difficulty HEENT: normal Neck: supple. no JVD. Carotids 2+ bilat; no bruits. No lymphadenopathy or thryomegaly appreciated. Cor: PMI nondisplaced. Regular rate & rhythm. No rubs, gallops or murmurs. Lungs: clear Abdomen: soft, nontender, nondistended. No hepatosplenomegaly. No bruits or masses. Good bowel sounds. Extremities: no cyanosis, clubbing, rash, edema Neuro: alert & orientedx3, cranial nerves grossly intact. moves all 4 extremities w/o difficulty. Affect pleasant  Improving steadily.  NYHA II. Volume status ok. Agree with increasing losartan carefully. Continue LifeVest and coumadin. Refer CR. Consider SGLT2i at next visit. Check echo  in 3 months to decide on ICD (suspect he will need one). F/u with Dr. Aundra Dubin.   Glori Bickers, MD  8:30 PM

## 2019-09-28 NOTE — Telephone Encounter (Signed)
Received a new Anticoagulation Enrollment message for pt. Per medlist, Warfarin was prescribed on 8/10/201 and it was 5mg  tablet with instructions to take 10mg  daily except 7.5mg  on Mondays, Wednesdays, and Fridays.   Warfarin started for LV thrombus per discharge summary and was due to follow up with [facility]. The pt had a virtual visit with [place] but no mention of getting an INR done to manage his Warfarin. Pt saw Dr. Aundra Dubin today and he mentioned that we will call the pt regarding setting up an appt to manage Warfarin.   Called pt but there was no answer so left a message for the pt to call back to the Anticoagulation Clinic at [phone] to schedule an appt. Will await a call back from the pt.

## 2019-09-29 ENCOUNTER — Telehealth (HOSPITAL_COMMUNITY): Payer: Self-pay | Admitting: Adult Health

## 2019-09-29 NOTE — Telephone Encounter (Signed)
Error

## 2019-10-04 ENCOUNTER — Other Ambulatory Visit (HOSPITAL_COMMUNITY): Payer: Self-pay

## 2019-10-04 DIAGNOSIS — I5022 Chronic systolic (congestive) heart failure: Secondary | ICD-10-CM

## 2019-10-04 MED FILL — WARFARIN SODIUM 5 MG TABLET: 5 | 20 days supply | Qty: 36 | Fill #0

## 2019-10-04 MED FILL — SPIRONOLACTONE 25 MG TABLET: 25 | 30 days supply | Qty: 15 | Fill #0

## 2019-10-04 MED FILL — COLCHICINE 0.6 MG TABS: 0.6 | 30 days supply | Qty: 30 | Fill #0

## 2019-10-04 MED FILL — DIGOXIN 0.125 MG TABLET: 125 | 30 days supply | Qty: 30 | Fill #0

## 2019-10-04 MED FILL — ATORVASTATIN 80 MG TABLET: 80 | 30 days supply | Qty: 30 | Fill #0

## 2019-10-04 MED FILL — CARVEDILOL 3.125 MG TABLET: 3.125 | 30 days supply | Qty: 60 | Fill #0

## 2019-10-04 NOTE — Progress Notes (Signed)
Orders Placed This Encounter  Procedures  . Basic Metabolic Panel (BMET)    Standing Status:   Future    Standing Expiration Date:   10/03/2020    Order Specific Question:   Release to patient    Answer:   Immediate

## 2019-10-05 ENCOUNTER — Ambulatory Visit (HOSPITAL_COMMUNITY)
Admission: RE | Admit: 2019-10-05 | Discharge: 2019-10-05 | Disposition: A | Discharge status: Home / Self Care | Payer: Medicaid Other | Source: Ambulatory Visit | Attending: Cardiology | Admitting: Cardiology

## 2019-10-05 ENCOUNTER — Other Ambulatory Visit: Payer: Self-pay

## 2019-10-05 DIAGNOSIS — I5022 Chronic systolic (congestive) heart failure: Secondary | ICD-10-CM | POA: Diagnosis not present

## 2019-10-05 LAB — BASIC METABOLIC PANEL
Anion gap: 11 (ref 5–15)
BUN: 16 mg/dL (ref 6–20)
CO2: 24 mmol/L (ref 22–32)
Calcium: 9.4 mg/dL (ref 8.9–10.3)
Chloride: 104 mmol/L (ref 98–111)
Creatinine, Ser: 0.93 mg/dL (ref 0.61–1.24)
GFR calc Af Amer: >60 mL/min (ref >60)
GFR calc non Af Amer: >60 mL/min (ref >60)
Glucose, Bld: 104 mg/dL — ABNORMAL HIGH (ref 70–99)
Potassium: 4.3 mmol/L (ref 3.5–5.1)
Sodium: 139 mmol/L (ref 135–145)

## 2019-10-06 ENCOUNTER — Ambulatory Visit (INDEPENDENT_AMBULATORY_CARE_PROVIDER_SITE_OTHER): Payer: Self-pay | Admitting: *Deleted

## 2019-10-06 DIAGNOSIS — Z5181 Encounter for therapeutic drug level monitoring: Secondary | ICD-10-CM

## 2019-10-06 DIAGNOSIS — I513 Intracardiac thrombosis, not elsewhere classified: Secondary | ICD-10-CM

## 2019-10-06 DIAGNOSIS — Z7901 Long term (current) use of anticoagulants: Secondary | ICD-10-CM

## 2019-10-06 LAB — POCT INR: INR: 2.9 (ref 2.0–3.0)

## 2019-10-06 NOTE — Patient Instructions (Signed)
A full discussion of the nature of anticoagulants has been carried out.  A benefit risk analysis has been presented to the patient, so that they understand the justification for choosing anticoagulation at this time. The need for frequent and regular monitoring, precise dosage adjustment and compliance is stressed.  Side effects of potential bleeding are discussed.  The patient should avoid any OTC items containing aspirin or ibuprofen, and should avoid great swings in general diet.  Avoid alcohol consumption.  Call if any signs of abnormal bleeding.   Description   Continue taking Warfarin 2 tablets (10mg ) daily except for 1.5 tablets Monday, Wednesday and Friday. Recheck INR in 1 week. Call coumadin clinic for any changes in medications or upcoming procedures. [phone].

## 2019-10-10 ENCOUNTER — Other Ambulatory Visit: Payer: Self-pay

## 2019-10-10 ENCOUNTER — Ambulatory Visit (HOSPITAL_COMMUNITY)
Admission: RE | Admit: 2019-10-10 | Discharge: 2019-10-10 | Disposition: A | Discharge status: Home / Self Care | Payer: Medicaid Other | Source: Ambulatory Visit | Attending: Cardiology | Admitting: Cardiology

## 2019-10-10 DIAGNOSIS — I5022 Chronic systolic (congestive) heart failure: Secondary | ICD-10-CM | POA: Insufficient documentation

## 2019-10-10 DIAGNOSIS — F17200 Nicotine dependence, unspecified, uncomplicated: Secondary | ICD-10-CM | POA: Insufficient documentation

## 2019-10-10 DIAGNOSIS — I350 Nonrheumatic aortic (valve) stenosis: Secondary | ICD-10-CM | POA: Insufficient documentation

## 2019-10-10 DIAGNOSIS — I252 Old myocardial infarction: Secondary | ICD-10-CM | POA: Diagnosis not present

## 2019-10-10 LAB — ECHOCARDIOGRAM COMPLETE
AR max vel: 1.03 cm2
AV Area VTI: 1.15 cm2
AV Area mean vel: 1.01 cm2
AV Mean grad: 21.5 mmHg
AV Peak grad: 34.7 mmHg
Ao pk vel: 2.95 m/s
Area-P 1/2: 6.32 cm2
Calc EF: 26.9 %
S' Lateral: 3.8 cm
Single Plane A2C EF: 29.4 %
Single Plane A4C EF: 24.4 %

## 2019-10-10 MED ORDER — PERFLUTREN LIPID MICROSPHERE
1.0000 mL | INTRAVENOUS | Status: AC | PRN
  Administered 2019-10-10: 4 mL via INTRAVENOUS
  Filled 2019-10-10: qty 10

## 2019-10-10 NOTE — Progress Notes (Signed)
  Echocardiogram 2D Echocardiogram has been performed.  Craig Flores 10/10/2019, 12:12 PM

## 2019-10-11 NOTE — Addendum Note (Signed)
Encounter addended by: Jolaine Artist, MD on: 10/11/2019 8:32 PM  Actions taken: Level of Service modified, Visit diagnoses modified

## 2019-10-12 ENCOUNTER — Ambulatory Visit (INDEPENDENT_AMBULATORY_CARE_PROVIDER_SITE_OTHER): Payer: Self-pay | Admitting: *Deleted

## 2019-10-12 ENCOUNTER — Telehealth (HOSPITAL_COMMUNITY): Payer: Self-pay

## 2019-10-12 ENCOUNTER — Other Ambulatory Visit: Payer: Self-pay

## 2019-10-12 DIAGNOSIS — Z5181 Encounter for therapeutic drug level monitoring: Secondary | ICD-10-CM

## 2019-10-12 DIAGNOSIS — Z7901 Long term (current) use of anticoagulants: Secondary | ICD-10-CM

## 2019-10-12 DIAGNOSIS — I513 Intracardiac thrombosis, not elsewhere classified: Secondary | ICD-10-CM

## 2019-10-12 DIAGNOSIS — I5022 Chronic systolic (congestive) heart failure: Secondary | ICD-10-CM

## 2019-10-12 LAB — POCT INR: INR: 4 — AB (ref 2.0–3.0)

## 2019-10-12 NOTE — Patient Instructions (Signed)
Description   Hold warfarin today, then start taking 1.5 tablets daily except for 2 tablets on Sunday, Tuesday and Thursday. Recheck INR in 1 week.  Call coumadin clinic for any changes in medications or upcoming procedures. [phone].

## 2019-10-12 NOTE — Telephone Encounter (Signed)
Patient advised and verbalized understanding. Patient is willing to see EP. Referral placed.   Orders Placed This Encounter  Procedures   Ambulatory referral to Cardiac Electrophysiology    Referral Priority:   Routine    Referral Type:   Consultation    Referral Reason:   Specialty Services Required    Requested Specialty:   Cardiology    Number of Visits Requested:   1

## 2019-10-12 NOTE — Telephone Encounter (Signed)
-----   Message from [name], MD sent at 10/11/2019 10:04 PM EDT ----- No revascularization of LAD was done.  Therefore, he should go ahead and get an ICD if he is amenable to EP referral.  Arrange followup for me at some point soon.

## 2019-10-14 MED FILL — FUROSEMIDE 20 MG TABS: 20 | 30 days supply | Qty: 30 | Fill #0

## 2019-10-20 NOTE — Addendum Note (Signed)
Encounter addended by: Harvie Junior, CMA on: 10/20/2019 9:35 AM  Actions taken: Charge Capture section accepted

## 2019-10-21 ENCOUNTER — Ambulatory Visit (INDEPENDENT_AMBULATORY_CARE_PROVIDER_SITE_OTHER): Payer: Self-pay | Admitting: *Deleted

## 2019-10-21 ENCOUNTER — Other Ambulatory Visit: Payer: Self-pay

## 2019-10-21 ENCOUNTER — Ambulatory Visit (INDEPENDENT_AMBULATORY_CARE_PROVIDER_SITE_OTHER): Payer: Self-pay | Admitting: Internal Medicine

## 2019-10-21 ENCOUNTER — Other Ambulatory Visit: Payer: Self-pay | Admitting: Internal Medicine

## 2019-10-21 ENCOUNTER — Encounter: Payer: Self-pay | Admitting: Internal Medicine

## 2019-10-21 VITALS — BP 92/70 | HR 85 | Ht 69.0 in | Wt 183.0 lb

## 2019-10-21 DIAGNOSIS — I255 Ischemic cardiomyopathy: Secondary | ICD-10-CM

## 2019-10-21 DIAGNOSIS — I513 Intracardiac thrombosis, not elsewhere classified: Secondary | ICD-10-CM

## 2019-10-21 DIAGNOSIS — Z01812 Encounter for preprocedural laboratory examination: Secondary | ICD-10-CM

## 2019-10-21 DIAGNOSIS — Z79899 Other long term (current) drug therapy: Secondary | ICD-10-CM

## 2019-10-21 DIAGNOSIS — Z5181 Encounter for therapeutic drug level monitoring: Secondary | ICD-10-CM

## 2019-10-21 DIAGNOSIS — I5021 Acute systolic (congestive) heart failure: Secondary | ICD-10-CM

## 2019-10-21 DIAGNOSIS — Z7901 Long term (current) use of anticoagulants: Secondary | ICD-10-CM

## 2019-10-21 LAB — POCT INR: INR: 3 (ref 2.0–3.0)

## 2019-10-21 MED FILL — OMEGA-3 ETHYL ESTERS 1 GM C: 1 | 30 days supply | Qty: 120 | Fill #1

## 2019-10-21 NOTE — Progress Notes (Signed)
ELECTROPHYSIOLOGY CONSULT NOTE  Patient ID: Craig Flores, MRN: [id number], DOB/AGE: 06-13-1965 54 y.o. Admit date: (Not on file) Date of Consult: 10/21/2019  Primary Physician: [name], MD Primary Cardiologist: DM     Craig Flores Has is a 54 y.o. male who is being seen today for the evaluation of ICD at the request of DM.    HPI Craig Flores is a 54 y.o. male   Seen for consideration of an ICD for ischemic cardiomyopathy following a late presentation with a completed anterior MI with ejection fraction in the 30% range with an occluded LAD, already collateralized and thus not revascularized.  Currently on digoxin, carvedilol losartan and Aldactone with further up titration limited by low blood pressure LV thrombus and on warfarin; ECG with narrow QRS Given the location of the MI, discharged on a LifeVest  Significant hypertriglyceridemia/dyslipidemia  The patient denies chest pain; no shortness of breath exactly but does have exertional fatigue and general weariness.  This is gotten worse over the last weeks..  There have been no palpitations, lightheadedness or syncope.    He in the past is struggled with depression.  He thinks he is doing okay now.  His wife has also been stressed by this.  DATE TEST EF   8/21 LHC   LAD T w/ collaterals  8//21 Echo   45 % LV thrombus  9/21 Echo  25-30%    Date Cr K Hgb  9/21 0.93 4.9 13.5(8/21)            Past Medical History:  Diagnosis Date  . Acid reflux   . CHF (congestive heart failure) ([place])   . Depression   . Migraines   . Myocardial infarction [facility])       Surgical History:  Past Surgical History:  Procedure Laterality Date  . CORONARY/GRAFT ACUTE MI REVASCULARIZATION N/A 08/28/2019   Procedure: Coronary/Graft Acute MI Revascularization;  Surgeon: [name], MD;  Location: [place] [facility];  Service: Cardiovascular;  Laterality: N/A;  . LEFT HEART CATH AND CORONARY ANGIOGRAPHY N/A 08/28/2019     Procedure: LEFT HEART CATH AND CORONARY ANGIOGRAPHY;  Surgeon: [name], MD;  Location: [place] [facility];  Service: Cardiovascular;  Laterality: N/A;     Home Meds: Current Meds  Medication Sig  . aspirin 81 MG chewable tablet Chew 1 tablet (81 mg total) by mouth daily.  Marland Kitchen atorvastatin (LIPITOR) 80 MG tablet Take 1 tablet (80 mg total) by mouth daily.  . carvedilol (COREG) 3.125 MG tablet Take 1 tablet (3.125 mg total) by mouth 2 (two) times daily with a meal.  . colchicine 0.6 MG tablet Take 1 tablet (0.6 mg total) by mouth daily.  . digoxin (LANOXIN) 0.125 MG tablet Take 1 tablet (0.125 mg total) by mouth daily.  . furosemide (LASIX) 20 MG tablet Take 1 tablet (20 mg total) by mouth as needed for fluid or edema (take if you gain more than 5 lb in a week).  Marland Kitchen losartan (COZAAR) 25 MG tablet Take 1 tablet (25 mg total) by mouth 2 (two) times daily.  . Multiple Vitamin (MULTIVITAMIN WITH MINERALS) TABS tablet Take 1 tablet by mouth daily.  Marland Kitchen omega-3 acid ethyl esters (LOVAZA) 1 g capsule Take 2 capsules (2 g total) by mouth 2 (two) times daily.  Marland Kitchen omeprazole (PRILOSEC OTC) 20 MG tablet Take 1 tablet (20 mg total) by mouth 2 (two) times daily.  . sertraline (ZOLOFT) 100 MG tablet  Take 1 tablet (100 mg total) by mouth daily. (Patient taking differently: Take 150 mg by mouth daily. )  . SILDENAFIL CITRATE PO Take 1-2 tablets by mouth as directed. 30 mg tablet - take 1 - 2 tablets 30 to 45 min before sexual activity as needed  . spironolactone (ALDACTONE) 25 MG tablet Take 0.5 tablets (12.5 mg total) by mouth daily.  Marland Kitchen warfarin (COUMADIN) 5 MG tablet Take 10 mg daily except only take 7.5 mg on Mondays, Wednesdays and Fridays    Allergies: No Known Allergies  Social History   Socioeconomic History  . Marital status: Married    Spouse name: Not on file  . Number of children: Not on file  . Years of education: Not on file  . Highest education level: Not on file  Occupational  History  . Not on file  Tobacco Use  . Smoking status: Former Smoker    Types: Cigarettes    Quit date: 09/05/2019    Years since quitting: 0.1  . Smokeless tobacco: Never Used  . Tobacco comment: x15 yrs as of 2021  Vaping Use  . Vaping Use: Never used  Substance and Sexual Activity  . Alcohol use: Not Currently    Comment: previous use but has not drank in months  . Drug use: Never  . Sexual activity: Yes    Birth control/protection: None  Other Topics Concern  . Not on file  Social History Narrative  . Not on file   Social Determinants of Health   Financial Resource Strain:   . Difficulty of Paying Living Expenses: Not on file  Food Insecurity:   . Worried About Charity fundraiser in the Last Year: Not on file  . Ran Out of Food in the Last Year: Not on file  Transportation Needs:   . Lack of Transportation (Medical): Not on file  . Lack of Transportation (Non-Medical): Not on file  Physical Activity:   . Days of Exercise per Week: Not on file  . Minutes of Exercise per Session: Not on file  Stress:   . Feeling of Stress : Not on file  Social Connections:   . Frequency of Communication with Friends and Family: Not on file  . Frequency of Social Gatherings with Friends and Family: Not on file  . Attends Religious Services: Not on file  . Active Member of Clubs or Organizations: Not on file  . Attends Archivist Meetings: Not on file  . Marital Status: Not on file  Intimate Partner Violence:   . Fear of Current or Ex-Partner: Not on file  . Emotionally Abused: Not on file  . Physically Abused: Not on file  . Sexually Abused: Not on file     Family History  Problem Relation Age of Onset  . Arrhythmia Father      ROS:  Please see the history of present illness.     All other systems reviewed and negative.    Physical Exam: Blood pressure 92/70, pulse 85, height 5\' 9"  (1.753 m), weight 183 lb (83 kg), SpO2 98 %. General: Well developed, well  nourished male in no acute distress. Head: Normocephalic, atraumatic, sclera non-icteric, no xanthomas, nares are without discharge. EENT: normal  Lymph Nodes:  none Neck: Negative for carotid bruits. JVD not elevated. Back:without scoliosis kyphosis Lungs: Clear bilaterally to auscultation without wheezes, rales, or rhonchi. Breathing is unlabored. LifeVest in place Heart: RRR with S1 S2. No   murmur . No rubs, or gallops  appreciated. Abdomen: Soft, non-tender, non-distended with normoactive bowel sounds. No hepatomegaly. No rebound/guarding. No obvious abdominal masses. Msk:  Strength and tone appear normal for age. Extremities: No clubbing or cyanosis. No edema.  Distal pedal pulses are 2+ and equal bilaterally.  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 CN III-XII intact Grossly normal sensory and motor function . Psych:  Responds to questions appropriately with a normal affect.      Labs: Cardiac Enzymes No results for input(s): CKTOTAL, CKMB, TROPONINI in the last 72 hours. CBC Lab Results  Component Value Date   WBC 7.0 09/12/2019   HGB 13.5 09/12/2019   HCT 40.8 09/12/2019   MCV 89.9 09/12/2019   PLT 356 09/12/2019   PROTIME: Recent Labs    10/21/19 1044  INR 3.0   Chemistry No results for input(s): NA, K, CL, CO2, BUN, CREATININE, CALCIUM, PROT, BILITOT, ALKPHOS, ALT, AST, GLUCOSE in the last 168 hours.  Invalid input(s): LABALBU Lipids Lab Results  Component Value Date   CHOL 236 (H) 08/30/2019   HDL 29 (L) 08/30/2019   LDLCALC UNABLE TO CALCULATE IF TRIGLYCERIDE OVER 400 mg/dL 08/30/2019   TRIG 495 (H) 08/30/2019   BNP No results found for: PROBNP Thyroid Function Tests: No results for input(s): TSH, T4TOTAL, T3FREE, THYROIDAB in the last 72 hours.  Invalid input(s): FREET3 Miscellaneous No results found for: DDIMER  Radiology/Studies:  ECHOCARDIOGRAM COMPLETE  Result Date: 10/10/2019     ECHOCARDIOGRAM REPORT   Patient Name:   Craig Flores Date of Exam: 10/10/2019 Medical Rec #:  [id number]         Height:       69.0 in Accession #:    [id number]        Weight:       181.4 lb Date of Birth:  1965-09-13          BSA:          1.982 m Patient Age:    4 years          BP:           100/69 mmHg Patient Gender: M                 HR:           85 bpm. Exam Location:  Outpatient Procedure: 2D Echo, 3D Echo, Cardiac Doppler, Color Doppler and Strain Analysis Indications:    I50.20* Unspecified systolic (congestive) heart failure  History:        Patient has prior history of Echocardiogram examinations, most                 recent 09/01/2019. CHF, Previous Myocardial Infarction, Aortic                 Valve Disease; Risk Factors:Current Smoker. ETOH. Cardiac shock.                 Apical thrombus. Aortic stenosis.  Sonographer:    Roseanna Rainbow Referring Phys: [place]  1. Left ventricular ejection fraction, by estimation, is 25 to 30%. The left ventricle has severely decreased function. The left ventricle demonstrates regional wall motion abnormalities (see scoring diagram/findings for description). There is mild concentric left ventricular hypertrophy. Left ventricular diastolic function could not be evaluated. Elevated left ventricular end-diastolic pressure. There is akinesis of the left ventricular, mid-apical lateral wall, apical segment, anterior wall, inferior wall, inferolateral  wall and septal wall. There is akinesis of the left ventricular, mid anteroseptal wall and inferoseptal wall.  2. Right ventricular systolic function is normal. The right ventricular size is normal.  3. The mitral valve is normal in structure. Mild mitral valve regurgitation. No evidence of mitral stenosis.  4. The aortic valve is tricuspid. There is moderate calcification of the aortic valve. There is moderate thickening of the aortic valve. Aortic valve regurgitation is not visualized. Moderate  aortic valve stenosis. Aortic valve mean gradient measures 21.5 mmHg. Aortic valve Vmax measures 2.94 m/s. The AV gradient is likely undestimated due to low flow low gradient AS.  5. The inferior vena cava is normal in size with greater than 50% respiratory variability, suggesting right atrial pressure of 3 mmHg. FINDINGS  Left Ventricle: Left ventricular ejection fraction, by estimation, is 25 to 30%. The left ventricle has severely decreased function. The left ventricle demonstrates regional wall motion abnormalities. Definity contrast agent was given IV to delineate the left ventricular endocardial borders. The left ventricular internal cavity size was normal in size. There is mild concentric left ventricular hypertrophy. Left ventricular diastolic function could not be evaluated. Elevated left ventricular end-diastolic pressure. Right Ventricle: The right ventricular size is normal. No increase in right ventricular wall thickness. Right ventricular systolic function is normal. Left Atrium: Left atrial size was normal in size. Right Atrium: Right atrial size was normal in size. Pericardium: There is no evidence of pericardial effusion. Mitral Valve: The mitral valve is normal in structure. Mild mitral valve regurgitation. No evidence of mitral valve stenosis. Tricuspid Valve: The tricuspid valve is normal in structure. Tricuspid valve regurgitation is trivial. No evidence of tricuspid stenosis. Aortic Valve: The aortic valve is tricuspid. There is moderate calcification of the aortic valve. There is moderate thickening of the aortic valve. Aortic valve regurgitation is not visualized. Moderate aortic stenosis is present. Aortic valve mean gradient measures 21.5 mmHg. Aortic valve peak gradient measures 34.7 mmHg. Aortic valve area, by VTI measures 1.15 cm. Pulmonic Valve: The pulmonic valve was normal in structure. Pulmonic valve regurgitation is not visualized. No evidence of pulmonic stenosis. Aorta: The  aortic root is normal in size and structure. Venous: The inferior vena cava is normal in size with greater than 50% respiratory variability, suggesting right atrial pressure of 3 mmHg. IAS/Shunts: No atrial level shunt detected by color flow Doppler.  LEFT VENTRICLE PLAX 2D LVIDd:         4.80 cm      Diastology LVIDs:         3.80 cm      LV e' medial:    5.66 cm/s LV PW:         1.50 cm      LV E/e' medial:  20.0 LV IVS:        1.20 cm      LV e' lateral:   8.16 cm/s LVOT diam:     2.20 cm      LV E/e' lateral: 13.8 LV SV:         68 LV SV Index:   34 LVOT Area:     3.80 cm  LV Volumes (MOD) LV vol d, MOD A2C: 180.0 ml LV vol d, MOD A4C: 164.0 ml LV vol s, MOD A2C: 127.0 ml LV vol s, MOD A4C: 124.0 ml LV SV MOD A2C:     53.0 ml LV SV MOD A4C:     164.0 ml LV SV MOD BP:  46.2 ml RIGHT VENTRICLE            IVC RV S prime:     9.03 cm/s  IVC diam: 1.70 cm TAPSE (M-mode): 1.8 cm LEFT ATRIUM             Index       RIGHT ATRIUM           Index LA diam:        3.40 cm 1.72 cm/m  RA Area:     13.80 cm LA Vol (A2C):   78.1 ml 39.41 ml/m RA Volume:   31.30 ml  15.79 ml/m LA Vol (A4C):   55.9 ml 28.20 ml/m LA Biplane Vol: 67.5 ml 34.06 ml/m  AORTIC VALVE AV Area (Vmax):    1.03 cm AV Area (Vmean):   1.01 cm AV Area (VTI):     1.15 cm AV Vmax:           294.50 cm/s AV Vmean:          222.000 cm/s AV VTI:            0.588 m AV Peak Grad:      34.7 mmHg AV Mean Grad:      21.5 mmHg LVOT Vmax:         80.00 cm/s LVOT Vmean:        59.100 cm/s LVOT VTI:          0.178 m LVOT/AV VTI ratio: 0.30  AORTA Ao Root diam: 3.50 cm Ao Asc diam:  3.40 cm MITRAL VALVE MV Area (PHT): 6.32 cm     SHUNTS MV Decel Time: 120 msec     Systemic VTI:  0.18 m MV E velocity: 113.00 cm/s  Systemic Diam: 2.20 cm Fransico Him MD Electronically signed by Fransico Him MD Signature Date/Time: 10/10/2019/1:34:25 PM    Final     EKG: Sinus at 85 Intervals 14/09/38 Axis left -88   Assessment and Plan:  Ischemic cardiomyopathy status  post late presenting anterior wall MI  Congestive heart failure-chronic-systolic class IIb  Sinus "tachycardia "-relative  Hypotension  Mural thrombus     Patient has persistent left ventricular dysfunction in the context of nonrevascularized late presenting MI.  As such he is a candidate now in greater than 40 days for consideration of ICD implantation for risk reduction of sudden cardiac death.   His blood pressure has limited up titration of medications which might further improve his LV function; his primary cardiologist nor I are sanguine for further recovery.  Hence, ICD implantation is indicated.    We have discussed the relative benefits and merits of transvenous versus subcutaneous ICD implantation.  The advantages of the former including battery longevity, history derived from use in randomized controlled trials and perhaps a somewhat lower rate of inappropriate ICD discharges. Advantages of the latter  include the fact that it is extravascular,  Resulting different implications of device infection and it being non-transvalvular.    Have reviewed the potential benefits and risks of ICD implantation including but not limited to death, perforation of heart or lung, lead dislodgement, infection,  device malfunction and inappropriate shocks.  The patient express understanding  and are willing to proceed.    We will plan to have him reduce his Coumadin for 2 days prior to device implantation but not to hold completely  Given the tachycardia with relatively low blood pressure I reached out to Dr. DM concerning the potential benefits of ivabradine  Craig Flores

## 2019-10-21 NOTE — Patient Instructions (Signed)
Description   Continue taking taking 1.5 tablets daily except for 2 tablets on Sunday, Tuesday and Thursday. Recheck INR in 1 week.  Call coumadin clinic for any changes in medications or upcoming procedures. [phone].

## 2019-10-21 NOTE — Patient Instructions (Signed)
Medication Instructions:  Your physician recommends that you continue on your current medications as directed. Please refer to the Current Medication list given to you today.  *If you need a refill on your cardiac medications before your next appointment, please call your pharmacy*   Lab Work: None ordered.  If you have labs (blood work) drawn today and your tests are completely normal, you will receive your results only by: Marland Kitchen MyChart Message (if you have MyChart) OR . A paper copy in the mail If you have any lab test that is abnormal or we need to change your treatment, we will call you to review the results.   Testing/Procedures: Your physician has recommended that you have a defibrillator inserted. An implantable cardioverter defibrillator (ICD) is a small device that is placed in your chest or, in rare cases, your abdomen. This device uses electrical pulses or shocks to help control life-threatening, irregular heartbeats that could lead the heart to suddenly stop beating (sudden cardiac arrest). Leads are attached to the ICD that goes into your heart. This is done in the hospital and usually requires an overnight stay. Please see the instruction sheet given to you today for more information.    Follow-Up: At [facility], you and your health needs are our priority.  As part of our continuing mission to provide you with exceptional heart care, we have created designated Provider Care Teams.  These Care Teams include your primary Cardiologist (physician) and Advanced Practice Providers (APPs -  Physician Assistants and Nurse Practitioners) who all work together to provide you with the care you need, when you need it.  We recommend signing up for the patient portal called "MyChart".  Sign up information is provided on this After Visit Summary.  MyChart is used to connect with patients for Virtual Visits (Telemedicine).  Patients are able to view lab/test results, encounter notes, upcoming  appointments, etc.  Non-urgent messages can be sent to your provider as well.   To learn more about what you can do with MyChart, go to [email or web address].    Your next appointment:   Follow up will be scheduled by Dr Olin Pia scheduler, Doylene Canning

## 2019-10-25 NOTE — Telephone Encounter (Signed)
Spoke with pt and reviewed Device Instruction Letter with pt.  See Letter for complete details.  Letter released to pt's MyCHart and hard copy placed to be mailed to pt's address in Epic.  Pt verbalizes understanding and agrees with current plan.

## 2019-10-28 ENCOUNTER — Other Ambulatory Visit: Payer: Self-pay | Admitting: Cardiology

## 2019-10-28 ENCOUNTER — Ambulatory Visit (INDEPENDENT_AMBULATORY_CARE_PROVIDER_SITE_OTHER): Payer: Medicaid Other | Admitting: *Deleted

## 2019-10-28 ENCOUNTER — Other Ambulatory Visit: Payer: Self-pay

## 2019-10-28 DIAGNOSIS — Z7901 Long term (current) use of anticoagulants: Secondary | ICD-10-CM

## 2019-10-28 DIAGNOSIS — I513 Intracardiac thrombosis, not elsewhere classified: Secondary | ICD-10-CM

## 2019-10-28 DIAGNOSIS — Z5181 Encounter for therapeutic drug level monitoring: Secondary | ICD-10-CM

## 2019-10-28 LAB — POCT INR: INR: 2.8 (ref 2.0–3.0)

## 2019-10-28 MED FILL — DIGOXIN 0.125 MG TABLET: 125 | 30 days supply | Qty: 30 | Fill #1

## 2019-10-28 MED FILL — CARVEDILOL 3.125 MG TABLET: 3.125 | 30 days supply | Qty: 60 | Fill #1

## 2019-10-28 MED FILL — SPIRONOLACTONE 25 MG TABLET: 25 | 30 days supply | Qty: 15 | Fill #1

## 2019-10-28 MED FILL — COLCHICINE 0.6 MG TABS: 0.6 | 30 days supply | Qty: 30 | Fill #1

## 2019-10-28 MED FILL — FUROSEMIDE 20 MG TABS: 20 | 30 days supply | Qty: 60 | Fill #1

## 2019-10-28 MED FILL — ATORVASTATIN CALCIUM 80 MG: 80 | 30 days supply | Qty: 30 | Fill #1

## 2019-10-28 MED FILL — LOSARTAN POTASSIUM 25 MG TA: 25 | 30 days supply | Qty: 60 | Fill #1

## 2019-10-28 MED FILL — WARFARIN SODIUM 5 MG TABLET: 5 | 28 days supply | Qty: 48 | Fill #0

## 2019-10-28 NOTE — Patient Instructions (Signed)
Description   Continue taking taking 1.5 tablets daily except for 2 tablets on Sunday, Tuesday and Thursday. Recheck INR in 2 weeks.   Call coumadin clinic for any changes in medications or upcoming procedures. [phone].

## 2019-11-04 ENCOUNTER — Other Ambulatory Visit: Payer: Self-pay

## 2019-11-04 ENCOUNTER — Ambulatory Visit (INDEPENDENT_AMBULATORY_CARE_PROVIDER_SITE_OTHER): Payer: Medicaid Other | Admitting: Clinical

## 2019-11-04 DIAGNOSIS — F332 Major depressive disorder, recurrent severe without psychotic features: Secondary | ICD-10-CM

## 2019-11-04 NOTE — Progress Notes (Signed)
Comprehensive Clinical Assessment (CCA) Note  11/04/2019 Linh Johannes [id number]   Virtual Visit via Video Note  I connected with Craig Flores on 11/04/19 at 10:00 AM EDT by a video enabled telemedicine application and verified that I am speaking with the correct person using two identifiers.  Location: Patient: Home Provider: Office   I discussed the limitations of evaluation and management by telemedicine and the availability of in person appointments. The patient expressed understanding and agreed to proceed.   Follow Up Instructions: I discussed the assessment and treatment plan with the patient. The patient was provided an opportunity to ask questions and all were answered. The patient agreed with the plan and demonstrated an understanding of the instructions.   The patient was advised to call back or seek an in-person evaluation if the symptoms worsen or if the condition fails to improve as anticipated.  I provided 36 minutes of non-face-to-face time during this encounter.    Visit Diagnosis:      ICD-10-CM   1. Major depressive disorder, recurrent episode, severe with anxious distress ([place])  F33.2       Client is a 54 year old male presenting to [facility] for outpatient behavioral health services by referral of [facility]. Client reported he has been receiving medication management for the past six years through [facility] for depression and anxiety. Client reported having anxiety and depressive symptoms for majority of his life but believes the past year his symptoms have worsened particularly in the past two months. Client reported three inpatient hospitalizations at [facility] health six years ago due to his depression. Client reported he is currently taking Mirtazapine, Zoloft, and propanol the past four years but the past two years have been ineffective with managing his symptoms. Client reported he had a major heart in August 2021 that he described as "really bad", and a part of  his heart was inoperable to restructure. Client reported his health has been a recent contribution to his depression as well.   Client reported he currently endorses insomnia, depressed mood, fatigue, difficulty concentrating, and feeling on edge. Client reported having passive SI but without plan and/ or attempt.  Client was assessed for the following SDOH:    Counselor from 11/04/2019 in [facility]  PHQ-9 Total Score 16     GAD 7 : Generalized Anxiety Score 11/04/2019  Nervous, Anxious, on Edge 3  Control/stop worrying 3  Worry too much - different things 3  Trouble relaxing 2  Restless 1  Easily annoyed or irritable 0  Afraid - awful might happen 1  Total GAD 7 Score 13  Anxiety Difficulty Very difficult     Treatment recommendations: individual therapy and psychiatric evaluation with medication management.   Clinician provided information on format of appointment (virtual or face to face).  Client was in agreement with treatment recommendations.    CCA Biopsychosocial  Intake/Chief Complaint:  CCA Intake With Chief Complaint CCA Part Two Date: 11/04/19 CCA Part Two Time: 1000 Chief Complaint/Presenting Problem: Client reported is he presenting due to symptoms of depression and anxiety. Client reported he has been doing medication management with [facility] for the past six years. Patient's Currently Reported Symptoms/Problems: Client reported feeling on edge/ keyed up, dificulty relaxing,difficulty with concentration, and no energy. Individual's Preferences: Client stated, "to be able to better think through life situations and make good choices that will give long term peace". Type of Services Patient Feels Are Needed: Individual therapy with psychiatric evaluation and medication management  Mental  Health Symptoms Depression:  Depression: Change in energy/activity, Duration of symptoms greater than two weeks, Difficulty Concentrating, Hopelessness,  Increase/decrease in appetite  Mania:  Mania: None  Anxiety:   Anxiety: Tension, Worrying  Psychosis:  Psychosis: None  Trauma:  Trauma: None  Obsessions:  Obsessions: None  Compulsions:  Compulsions: None  Inattention:  Inattention: None  Hyperactivity/Impulsivity:  Hyperactivity/Impulsivity: N/A  Oppositional/Defiant Behaviors:  Oppositional/Defiant Behaviors: None  Emotional Irregularity:  Emotional Irregularity: None  Other Mood/Personality Symptoms:      Mental Status Exam Appearance and self-care  Stature:  Stature: Average  Weight:  Weight: Average weight  Clothing:  Clothing: Casual  Grooming:  Grooming: Normal  Cosmetic use:  Cosmetic Use: Age appropriate  Posture/gait:  Posture/Gait: Normal  Motor activity:  Motor Activity: Not Remarkable  Sensorium  Attention:  Attention: Normal  Concentration:  Concentration: Normal  Orientation:  Orientation: X5  Recall/memory:  Recall/Memory: Normal  Affect and Mood  Affect:  Affect: Congruent  Mood:  Mood: Depressed  Relating  Eye contact:  Eye Contact: Normal  Facial expression:  Facial Expression: Depressed  Attitude toward examiner:  Attitude Toward Examiner: Cooperative  Thought and Language  Speech flow: Speech Flow: Clear and Coherent  Thought content:  Thought Content: Appropriate to Mood and Circumstances  Preoccupation:  Preoccupations: None  Hallucinations:  Hallucinations: None  Organization:     Transport planner of Knowledge:  Fund of Knowledge: Good  Intelligence:  Intelligence: Average  Abstraction:  Abstraction: Normal  Judgement:  Judgement: Good  Reality Testing:  Reality Testing: Adequate  Insight:  Insight: Good  Decision Making:  Decision Making: Normal  Social Functioning  Social Maturity:  Social Maturity: Responsible  Social Judgement:  Social Judgement: Normal  Stress  Stressors:  Stressors: Museum/gallery curator, Family conflict, Relationship  Coping Ability:  Coping Ability: Materials engineer  Deficits:  Skill Deficits: Communication, Activities of daily living  Supports:  Supports: Family     Religion: Religion/Spirituality Are You A Religious Person?: No  Leisure/Recreation: Leisure / Recreation Do You Have Hobbies?: Yes Leisure and Hobbies: writing, walking, and reading  Exercise/Diet: Exercise/Diet Do You Exercise?: No Have You Gained or Lost A Significant Amount of Weight in the Past Six Months?: No Do You Follow a Special Diet?: No Do You Have Any Trouble Sleeping?: Yes   CCA Employment/Education  Employment/Work Situation: Employment / Work Situation Employment situation: Unemployed Patient's job has been impacted by current illness: Yes Where was the patient employed at that time?: 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  Education: Education Did Teacher, adult education From [employer]?: Yes Did Physicist, medical?: Yes Did You Attend Graduate School?: Yes What is Your Post Graduate Degree?: Law- JD   CCA Family/Childhood History  Family and Relationship History: Family history Marital status: Married Number of Years Married: 11 Additional relationship information: Client reported his wife is a alcoholic, substance user and has erratic behavior that is stressful for him to navigate. Does patient have children?: Yes How many children?: 2 How is patient's relationship with their children?: Client reported he does not have biological children but current wife has a son who looks at him as a dad. Client reported with his ex -wife they adopted a daughter. Client reported his daughter is in [place] and they see eachther about once a year.  Childhood History:  Childhood History By whom was/is the patient raised?: Both parents Description of patient's  relationship with caregiver when they were a child: Dad worked and had a business, his mom stayed at home and helped with  the business. Client reported he was closer to his mom. Client reported his parents wer egreat together. Patient's description of current relationship with people who raised him/her: Mom passed in 2011, dad lives in [place]. [place] reported he has a "superficial" relationship with his father. Does patient have siblings?: Yes Number of Siblings: 2 Description of patient's current relationship with siblings: Client reported he does not talk to his brothe rmuch but talks more frequently. Did patient suffer any verbal/emotional/physical/sexual abuse as a child?: No Did patient suffer from severe childhood neglect?: No Has patient ever been sexually abused/assaulted/raped as an adolescent or adult?: No Was the patient ever a victim of a crime or a disaster?: No Witnessed domestic violence?: No Has patient been affected by domestic violence as an adult?: No  Child/Adolescent Assessment:     CCA Substance Use  Alcohol/Drug Use: Alcohol / Drug Use History of alcohol / drug use?: No history of alcohol / drug abuse                         ASAM's:  Six Dimensions of Multidimensional Assessment  Dimension 1:  Acute Intoxication and/or Withdrawal Potential:      Dimension 2:  Biomedical Conditions and Complications:      Dimension 3:  Emotional, Behavioral, or Cognitive Conditions and Complications:     Dimension 4:  Readiness to Change:     Dimension 5:  Relapse, Continued use, or Continued Problem Potential:     Dimension 6:  Recovery/Living Environment:     ASAM Severity Score:    ASAM Recommended Level of Treatment:     Substance use Disorder (SUD)    Recommendations for Services/Supports/Treatments:    DSM5 Diagnoses: Patient Active Problem List   Diagnosis Date Noted  . Encounter for monitoring Coumadin therapy 09/28/2019  . ST elevation myocardial infarction (STEMI) ([place])   . Apical mural thrombus   . Ischemic cardiomyopathy   . Mixed hyperlipidemia   .  Acute systolic CHF (congestive heart failure) ([place])   . Cardiogenic shock ([place])   . Acute ST elevation myocardial infarction (STEMI) of anterior wall ([place]) 08/28/2019  . Major depressive disorder, recurrent episode, severe with anxious distress ([place])   . Alcohol abuse   . Alcohol dependence with withdrawal, uncomplicated ([place]) 28/41/3244  . Generalized anxiety disorder 04/26/2014  . Suicidal ideations 04/26/2014  . Depression, major, single episode, severe ([place]) 04/26/2014  . Substance induced mood disorder ([place]) 04/26/2014    Patient Centered Plan: Patient is on the following Treatment Plan(s):  Anxiety and Depression   Referrals to Alternative Service(s): Referred to Alternative Service(s):   Place:   Date:   Time:    Referred to Alternative Service(s):   Place:   Date:   Time:    Referred to Alternative Service(s):   Place:   Date:   Time:    Referred to Alternative Service(s):   Place:   Date:   Time:     Bernestine Amass

## 2019-11-11 ENCOUNTER — Other Ambulatory Visit: Payer: Self-pay

## 2019-11-11 ENCOUNTER — Ambulatory Visit (INDEPENDENT_AMBULATORY_CARE_PROVIDER_SITE_OTHER): Payer: Medicaid Other | Admitting: *Deleted

## 2019-11-11 DIAGNOSIS — Z7901 Long term (current) use of anticoagulants: Secondary | ICD-10-CM | POA: Diagnosis not present

## 2019-11-11 DIAGNOSIS — I513 Intracardiac thrombosis, not elsewhere classified: Secondary | ICD-10-CM

## 2019-11-11 DIAGNOSIS — Z5181 Encounter for therapeutic drug level monitoring: Secondary | ICD-10-CM

## 2019-11-11 LAB — POCT INR: INR: 4.2 — AB (ref 2.0–3.0)

## 2019-11-11 NOTE — Patient Instructions (Signed)
Description   Hold warfarin today and tomorrow, then continue taking taking 1.5 tablets daily except for 2 tablets on Sunday, Tuesday and Thursday. Be consistent with your 2 servings of greens. Recheck INR in 2 weeks. Call coumadin clinic for any changes in medications or upcoming procedures. [phone].

## 2019-11-17 ENCOUNTER — Encounter (HOSPITAL_COMMUNITY): Payer: Self-pay | Admitting: Cardiology

## 2019-11-17 ENCOUNTER — Other Ambulatory Visit: Payer: Self-pay

## 2019-11-17 ENCOUNTER — Other Ambulatory Visit (HOSPITAL_COMMUNITY): Payer: Self-pay | Admitting: Cardiology

## 2019-11-17 ENCOUNTER — Ambulatory Visit (HOSPITAL_COMMUNITY)
Admission: RE | Admit: 2019-11-17 | Discharge: 2019-11-17 | Disposition: A | Discharge status: Home / Self Care | Payer: Medicaid Other | Source: Ambulatory Visit | Attending: Cardiology | Admitting: Cardiology

## 2019-11-17 VITALS — BP 118/80 | HR 88 | Wt 180.2 lb

## 2019-11-17 DIAGNOSIS — I2510 Atherosclerotic heart disease of native coronary artery without angina pectoris: Secondary | ICD-10-CM | POA: Diagnosis not present

## 2019-11-17 DIAGNOSIS — I513 Intracardiac thrombosis, not elsewhere classified: Secondary | ICD-10-CM

## 2019-11-17 DIAGNOSIS — Z7901 Long term (current) use of anticoagulants: Secondary | ICD-10-CM | POA: Insufficient documentation

## 2019-11-17 DIAGNOSIS — I350 Nonrheumatic aortic (valve) stenosis: Secondary | ICD-10-CM | POA: Diagnosis not present

## 2019-11-17 DIAGNOSIS — I5022 Chronic systolic (congestive) heart failure: Secondary | ICD-10-CM | POA: Diagnosis not present

## 2019-11-17 DIAGNOSIS — Z79899 Other long term (current) drug therapy: Secondary | ICD-10-CM | POA: Diagnosis not present

## 2019-11-17 DIAGNOSIS — F17210 Nicotine dependence, cigarettes, uncomplicated: Secondary | ICD-10-CM | POA: Insufficient documentation

## 2019-11-17 DIAGNOSIS — I2109 ST elevation (STEMI) myocardial infarction involving other coronary artery of anterior wall: Secondary | ICD-10-CM | POA: Insufficient documentation

## 2019-11-17 DIAGNOSIS — F329 Major depressive disorder, single episode, unspecified: Secondary | ICD-10-CM | POA: Diagnosis not present

## 2019-11-17 DIAGNOSIS — Z86718 Personal history of other venous thrombosis and embolism: Secondary | ICD-10-CM | POA: Diagnosis not present

## 2019-11-17 DIAGNOSIS — R0602 Shortness of breath: Secondary | ICD-10-CM | POA: Diagnosis present

## 2019-11-17 DIAGNOSIS — K219 Gastro-esophageal reflux disease without esophagitis: Secondary | ICD-10-CM | POA: Insufficient documentation

## 2019-11-17 DIAGNOSIS — E785 Hyperlipidemia, unspecified: Secondary | ICD-10-CM | POA: Insufficient documentation

## 2019-11-17 DIAGNOSIS — I255 Ischemic cardiomyopathy: Secondary | ICD-10-CM | POA: Insufficient documentation

## 2019-11-17 DIAGNOSIS — I252 Old myocardial infarction: Secondary | ICD-10-CM | POA: Insufficient documentation

## 2019-11-17 DIAGNOSIS — Z7982 Long term (current) use of aspirin: Secondary | ICD-10-CM | POA: Diagnosis not present

## 2019-11-17 LAB — BASIC METABOLIC PANEL
Anion gap: 12 (ref 5–15)
BUN: 11 mg/dL (ref 6–20)
CO2: 22 mmol/L (ref 22–32)
Calcium: 9.5 mg/dL (ref 8.9–10.3)
Chloride: 105 mmol/L (ref 98–111)
Creatinine, Ser: 0.83 mg/dL (ref 0.61–1.24)
GFR, Estimated: >60 mL/min (ref >60)
Glucose, Bld: 101 mg/dL — ABNORMAL HIGH (ref 70–99)
Potassium: 4 mmol/L (ref 3.5–5.1)
Sodium: 139 mmol/L (ref 135–145)

## 2019-11-17 LAB — LIPID PANEL
Cholesterol: 117 mg/dL (ref 0–200)
HDL: 28 mg/dL — ABNORMAL LOW (ref >40)
LDL Cholesterol: 64 mg/dL (ref 0–99)
Total CHOL/HDL Ratio: 4.2 RATIO
Triglycerides: 126 mg/dL (ref <150)
VLDL: 25 mg/dL (ref 0–40)

## 2019-11-17 LAB — DIGOXIN LEVEL: Digoxin Level: 0.7 ng/mL — ABNORMAL LOW (ref 1.0–2.0)

## 2019-11-17 MED ORDER — ENTRESTO 24-26 MG PO TABS: 1.0000 | ORAL_TABLET | Freq: Two times a day (BID) | ORAL | 3 refills | Status: DC

## 2019-11-17 MED FILL — ENTRESTO 24 MG-26 MG TABLET: 24-26 | 30 days supply | Qty: 60 | Fill #0

## 2019-11-17 NOTE — Patient Instructions (Signed)
Stop Losartan  Start Entresto 24/26 mg Twice daily   Labs done today, your results will be available in MyChart, we will contact you for abnormal readings.  You have been referred to Cardiac Rehab, they will call you to schedule this  Please follow up with our heart failure pharmacist in 3 weeks  Your physician recommends that you schedule a follow-up appointment in: 2 months  If you have any questions or concerns before your next appointment please send [place] a message through [place] or call our office at [phone].    TO LEAVE A MESSAGE FOR THE NURSE SELECT OPTION 2, PLEASE LEAVE A MESSAGE INCLUDING: . YOUR NAME . DATE OF BIRTH . CALL BACK NUMBER . REASON FOR CALL**this is important as we prioritize the call backs  [place] AS LONG AS YOU CALL BEFORE 4:00 PM  At the [facility], you and your health needs are our priority. As part of our continuing mission to provide you with exceptional heart care, we have created designated Provider Care Teams. These Care Teams include your primary Cardiologist (physician) and Advanced Practice Providers (APPs- Physician Assistants and Nurse Practitioners) who all work together to provide you with the care you need, when you need it.   You may see any of the following providers on your designated Care Team at your next follow up: Marland Kitchen Dr Glori Bickers . Dr Loralie Champagne . Darrick Grinder, NP . Lyda Jester, PA . Audry Riles, PharmD   Please be sure to bring in all your medications bottles to every appointment.

## 2019-11-18 NOTE — Progress Notes (Signed)
Advanced Heart Failure Clinic Note   Referring Physician: HF Cardiology: Dr. Aundra Dubin  Reason for Visit: Heart Failure    HPI: 54 y.o. male smoker was admitted on 08/28/19 for delayed presentation anterior STEMI. Had had >36 hr of chest pain prior to seeking medical attention. Pain progressed to more pleuritic like CP. Found to have anterior ST elevations w/ precordial Q waves on admit. Hs troponin >27,000. Urgent cardiac cath showed totally occluded mid LAD w/o collaterals and 40% mid RCA stenosis. It was suspected he had completed his infarct and residual pleuritic CP was post-MI pericarditis. No intervention was performed. 2D echo demonstrated moderately reduced LVEF, 45-50%, + apical thrombus and moderate AS. MV normal. RV systolic function mildly reduced.   He was placed on medical management for CAD and systolic HF. He was started on ASA, atorvastatin, Losartan and Coreg. Coumadin started for apical thrombus w/ heparin bridge.   Post cath, there were initial concerns for developing cardiogenic shock but he remained stable and did not require pressor/inotropic support. Co-ox remained stable. Repeat echo showed further reduction of LVEF down to 30-35% but no post MI mechanical complications. RV mildly reduced. He did require IV Lasix for pulmonary edema and volume status and dyspnea improved w/ diuresis. He was continued on GTDMT w/ losartan, spironolactone and Coreg. BP too soft for Entresto. He was treated w/ colchicine for post MI pericarditis w/ improvement in pleuritic chest pain. Given anterior MI w/ EF <35%, he was fitted w/ a LifeVest prior to discharge w/ plans to get repeat echo in 1 month.   He had repeat echo in 9/21, showing EF 25-30%.  He saw Dr. Caryl Comes with plan for ICD 11/28/19.  He is still wearing Lifevest.   He returns for followup of CHF and CAD.  He seems to be doing generally well.  Occasional sensation of fullness in his chest but no pain.  Using Lasix prn, but still takes  4-5 times/week.  No lightheadedness.  He can walk for 30-45 minutes with no problems on flat ground.  He gets short of breath walking up stairs or up a hill. No orthopnea/PND. Still smoking < 1 ppd. He is an Chief Strategy Officer, working on a novel.   ECG: NSR, old anterior and inferior MI  Labs (9/21): K 4.3, creatinine 0.93  PMH: 1. GERD 2. Depression  3. CAD: Anterior STEMI in 8/21 with totally occluded LAD/no collaterals, 40% mid RCA.  Due to late presentation, no PCI was done.  - Had post-infarct pericarditis.  4. Chronic systolic CHF: Ischemic cardiomyopathy.  - Echo (9/21) with EF 25-30%, normal RV, moderate AS with mean gradient 22 mmHg.  5. LV thrombus: Apical thrombus noted on echo at time of MI in 8/21. Resolved by 9/21 echo.  6. Aortic stenosis: Moderate on 9/21 echo.  7. Active smoker.   Current Outpatient Medications  Medication Sig Dispense Refill  . aspirin 81 MG chewable tablet Chew 1 tablet (81 mg total) by mouth daily. 30 tablet 11  . atorvastatin (LIPITOR) 80 MG tablet Take 1 tablet (80 mg total) by mouth daily. 30 tablet 5  . carvedilol (COREG) 3.125 MG tablet Take 1 tablet (3.125 mg total) by mouth 2 (two) times daily with a meal. 60 tablet 5  . colchicine 0.6 MG tablet Take 1 tablet (0.6 mg total) by mouth daily. 30 tablet 2  . digoxin (LANOXIN) 0.125 MG tablet Take 1 tablet (0.125 mg total) by mouth daily. 30 tablet 5  . furosemide (LASIX) 20 MG tablet  Take 1 tablet (20 mg total) by mouth as needed for fluid or edema (take if you gain more than 5 lb in a week). 30 tablet 11  . Multiple Vitamin (MULTIVITAMIN WITH MINERALS) TABS tablet Take 1 tablet by mouth daily.    Marland Kitchen omega-3 acid ethyl esters (LOVAZA) 1 g capsule Take 2 capsules (2 g total) by mouth 2 (two) times daily. 60 capsule 5  . omeprazole (PRILOSEC OTC) 20 MG tablet Take 1 tablet (20 mg total) by mouth 2 (two) times daily.    . sertraline (ZOLOFT) 100 MG tablet Take 1 tablet (100 mg total) by mouth daily. (Patient  taking differently: Take 150 mg by mouth daily. ) 30 tablet 0  . SILDENAFIL CITRATE PO Take 1-2 tablets by mouth as directed. 30 mg tablet - take 1 - 2 tablets 30 to 45 min before sexual activity as needed    . spironolactone (ALDACTONE) 25 MG tablet Take 0.5 tablets (12.5 mg total) by mouth daily. 30 tablet 5  . warfarin (COUMADIN) 5 MG tablet TAKE 1.5 TABLETS (7.5 MG TOTAL) BY MOUTH DAILY EXCEPT ONLY TAKE 2 TABLETS (10 MG TOTAL) BY MOUTH ON SUNDAYS, TUESDAYS AND THURSDAYS 55 tablet 1  . sacubitril-valsartan (ENTRESTO) 24-26 MG Take 1 tablet by mouth 2 (two) times daily. 60 tablet 3   No current facility-administered medications for this encounter.    No Known Allergies    Social History   Socioeconomic History  . Marital status: Married    Spouse name: Not on file  . Number of children: Not on file  . Years of education: Not on file  . Highest education level: Not on file  Occupational History  . Not on file  Tobacco Use  . Smoking status: Current Every Day Smoker    Packs/day: 0.50    Types: Cigarettes    Last attempt to quit: 09/05/2019    Years since quitting: 0.2  . Smokeless tobacco: Never Used  . Tobacco comment: x15 yrs as of 2021  Vaping Use  . Vaping Use: Never used  Substance and Sexual Activity  . Alcohol use: Not Currently    Comment: previous use but has not drank in months  . Drug use: Never  . Sexual activity: Yes    Birth control/protection: None  Other Topics Concern  . Not on file  Social History Narrative  . Not on file   Social Determinants of Health   Financial Resource Strain:   . Difficulty of Paying Living Expenses: Not on file  Food Insecurity:   . Worried About Charity fundraiser in the Last Year: Not on file  . Ran Out of Food in the Last Year: Not on file  Transportation Needs:   . Lack of Transportation (Medical): Not on file  . Lack of Transportation (Non-Medical): Not on file  Physical Activity:   . Days of Exercise per Week: Not  on file  . Minutes of Exercise per Session: Not on file  Stress:   . Feeling of Stress : Not on file  Social Connections:   . Frequency of Communication with Friends and Family: Not on file  . Frequency of Social Gatherings with Friends and Family: Not on file  . Attends Religious Services: Not on file  . Active Member of Clubs or Organizations: Not on file  . Attends Archivist Meetings: Not on file  . Marital Status: Not on file  Intimate Partner Violence:   . Fear of Current or  Ex-Partner: Not on file  . Emotionally Abused: Not on file  . Physically Abused: Not on file  . Sexually Abused: Not on file      Family History  Problem Relation Age of Onset  . Arrhythmia Father     Vitals:   11/17/19 1211  BP: 118/80  Pulse: 88  SpO2: 97%  Weight: 81.7 kg (180 lb 3.2 oz)   Wt Readings from Last 3 Encounters:  11/17/19 81.7 kg (180 lb 3.2 oz)  10/21/19 83 kg (183 lb)  09/28/19 82.3 kg (181 lb 6 oz)    PHYSICAL EXAM: General: NAD Neck: No JVD, no thyromegaly or thyroid nodule.  Lungs: Clear to auscultation bilaterally with normal respiratory effort. CV: Nondisplaced PMI.  Heart regular S1/S2, no S3/S4, no murmur.  No peripheral edema.  No carotid bruit.  Normal pedal pulses.  Abdomen: Soft, nontender, no hepatosplenomegaly, no distention.  Skin: Intact without lesions or rashes.  Neurologic: Alert and oriented x 3.  Psych: Normal affect. Extremities: No clubbing or cyanosis.  HEENT: Normal.   ASSESSMENT & PLAN: 1. CAD: Admission 8/21 for acute anterior MI, delayed presentation with no ischemic pain, had pleuritic-type chest pain (post-MI pericarditis). LHC with totally occluded mid LAD w/o collaterals. Suspected completed MI, no intervention. Plan medical management. No ischemic chest pain.  - Continue ASA 81 mg daily  - Continue atorvastatin 80 mg daily, check lipids. - Refer to cardiac rehab.   2. Chronic systolic CHF: Ischemic Cardiomyopathy.  Reduced EF  post anterior MI.  Echo in 9/21 with EF 25-30%, peri-apical akinesis, moderate AS. Cath w/ occluded mLAD but no intervention as he had completed his infarct. Currently NYHA class II, not volume overloaded on exam.  Still wearing Lifevest.  - Plan for ICD on 11/28/19, can send back Lifevest after placement.  - Stop losartan, start Entresto 24/26 bid.  BMET today and in 10 days.  - Continue  spironolactone 12.5 mg daily. - Continue Coreg 3.125 mg bid. - Continue digoxin 0.125 daily. Check level today.  - PRN Lasix 20 mgfor wt gain > 3 lb in 24 hr or > 5 lb in 1 week  3. Post MI Pericarditis: Pleuritic CP resolved.  -ContinueColchicine 0.6 mg dailyx 3 months.  4. LV thrombus: Large LV thrombus noted on echo in 8/21, resolved on 9/21 echo.   - Continue warfarin.  5. H/o Tobacco Abuse: Still smoking, needs to quit.  6. HLD: Continue atorvastatin, check lipids today.   Followup with HF pharmacist in 3 wks for 2-3 visits for med titration, then see me in 2 months.   Loralie Champagne, MD  11/18/2019

## 2019-11-22 ENCOUNTER — Telehealth (HOSPITAL_COMMUNITY): Payer: Self-pay | Admitting: Pharmacy Technician

## 2019-11-22 NOTE — Telephone Encounter (Signed)
Advanced Heart Failure Patient Advocate Encounter  Prior Authorization for Delene Loll has been submitted approved.    PA# [id number] Effective dates: 11/22/19 through 11/20/20  Charlann Boxer, CPhT

## 2019-11-22 NOTE — Progress Notes (Signed)
Referring Physician: HF Cardiology: Dr. Aundra Dubin  HPI:  54 y.o. male smoker was admitted on 8/1/21for delayed presentation anterior STEMI. Had had >36 hr of chest pain prior to seeking medical attention. Pain progressed to more pleuritic like CP. Found to have anterior ST elevations w/ precordial Q waves on admit. Hs troponin >27,000. Urgent cardiac cath showed totally occluded mid LAD w/o collaterals and 40% mid RCA stenosis. It was suspected he had completed his infarct and residual pleuritic CP was post-MI pericarditis. No intervention was performed. 2D echo demonstrated moderately reduced LVEF, 45-50%, + apical thrombus and moderate AS. MV normal. RV systolic function mildly reduced.   He was placed on medical management for CAD and systolic HF. He was started on ASA, atorvastatin, Losartan and carvedilol. Coumadin started for apical thrombusw/ heparin bridge.   Post cath, there were initial concerns for developing cardiogenic shock but he remained stable and did not require pressor/inotropic support. Co-ox remained stable. Repeat echo showed further reduction of LVEF down to 30-35% but no post MI mechanical complications. RV mildly reduced. He did require IV furosemide for pulmonary edema and volume status and dyspnea improved w/ diuresis. He was continued on GDMT w/ losartan, spironolactone and carvedilol. BP too soft for Entresto. He was treated w/ colchicine for post MI pericarditis w/ improvement in pleuritic chest pain. Given anterior MI w/ EF <35%, he was fitted w/ a LifeVest prior to discharge w/ plans to get repeat echo in 1 month.   He had repeat echo in 9/21, showing EF 25-30%.  He saw Dr. Caryl Comes with plan for ICD 11/28/19.  He is still wearing Lifevest.   He recently returned to HF Clinic for followup of CHF and CAD on 11/17/19.  He seemed to be doing generally well.  Occasional sensation of fullness in his chest but no pain.  Using furosemide prn, but still takes 4-5 times/week.  No  lightheadedness.  He reported walking for 30-45 minutes with no problems on flat ground.  He reported getting short of breath walking up stairs or up a hill. No orthopnea/PND. Still smoking < 1 ppd. He is an Chief Strategy Officer, working on a novel.    Today he returns to HF clinic for pharmacist medication titration. At last visit with MD, losartan was discontinued and Entresto 24/26 mg BID was initiated. Overall he is feeling well today. Has occasional dizziness which is not bothersome and is not worse since changing to [facility]. No chest pain or palpitations. Can walk for 40-60 min before getting SOB. Is notably more SOB going up stairs. Takes furosemide PRN approximately 3-4 times per week. No LEE, PND or orthopnea. Taking all medications as prescribed and tolerating all medications.    HF Medications: Carvedilol 3.125 mg BID Entresto 24/26 mg BID Spironolactone 12.5 mg daily Digoxin 0.125 mg daily Furosemide 20 mg PRN  Has the patient been experiencing any side effects to the medications prescribed?  no  Does the patient have any problems obtaining medications due to transportation or finances?   No - has traditional [place] Medicaid  Understanding of regimen: good Understanding of indications: good Potential of compliance: good Patient understands to avoid NSAIDs. Patient understands to avoid decongestants.    Pertinent Lab Values (11/24/19): Marland Kitchen Serum creatinine 0.87, BUN 13, Potassium 4.1, Sodium 139  Vital Signs: . Weight: 177.8 lbs (last clinic weight: 175 lbs) . Blood pressure: 104/64  . Heart rate: 86   Assessment: 1. CAD: Admission 8/21 for acute anterior MI, delayed presentation with no ischemic pain,  had pleuritic-type chest pain (post-MI pericarditis). LHC with totally occluded mid LAD w/o collaterals. Suspected completed MI, no intervention. Plan medical management. No ischemic chest pain.  - Continue ASA 81 mg daily. Not on clopidogrel because on dual warfarin/aspirin.   - Continue  atorvastatin 80 mg daily, check lipids. - Refer to cardiac rehab.   2. Chronic systolic CHF: Ischemic Cardiomyopathy.  Reduced EF post anterior MI.  Echo in 9/21 with EF 25-30%, peri-apical akinesis, moderate AS. Cath w/ occluded mLAD but no intervention as he had completed his infarct.  - Currently NYHA class II, not volume overloaded on exam.   - Received [employer] ICD on 11/28/19 - Continue furosemide 20 mg PRN - Continue carvedilol 3.125 mg BID. - Continue Entresto 24/26 mg BID.   - Increase spironolactone to 25 mg daily. BMET in 1 week.  - Continue digoxin 0.125 daily. Last level 0.7 ng/mL on 11/17/19.   3. Post MI Pericarditis: Pleuritic CP resolved.  -ContinueColchicine 0.6 mg dailyx 3 months. Sent in prescription for brand Mitigare today as this is preferred on Medicaid's formulary.  4. LV thrombus: Large LV thrombus noted on echo in 8/21, resolved on 9/21 echo.   - Continue warfarin.  5. H/o Tobacco Abuse: Still smoking, needs to quit.  6. HLD: Continue atorvastatin    Plan: 1) Medication changes: Based on clinical presentation, vital signs and recent labs will increase spironolactone to 25 mg daily.  2) Follow-up: 3 weeks with pharmacy clinic   Audry Riles, PharmD, BCPS, BCCP, CPP Heart Failure Clinic Pharmacist [phone]

## 2019-11-23 ENCOUNTER — Telehealth (INDEPENDENT_AMBULATORY_CARE_PROVIDER_SITE_OTHER): Payer: Medicaid Other | Admitting: Psychiatry

## 2019-11-23 ENCOUNTER — Encounter (HOSPITAL_COMMUNITY): Payer: Self-pay | Admitting: Psychiatry

## 2019-11-23 ENCOUNTER — Other Ambulatory Visit: Payer: Self-pay

## 2019-11-23 ENCOUNTER — Other Ambulatory Visit (HOSPITAL_COMMUNITY): Payer: Self-pay | Admitting: Psychiatry

## 2019-11-23 DIAGNOSIS — F411 Generalized anxiety disorder: Secondary | ICD-10-CM | POA: Diagnosis not present

## 2019-11-23 DIAGNOSIS — F331 Major depressive disorder, recurrent, moderate: Secondary | ICD-10-CM

## 2019-11-23 MED ORDER — HYDROXYZINE HCL 10 MG PO TABS: 10.0000 mg | ORAL_TABLET | Freq: Three times a day (TID) | ORAL | 2 refills | Status: DC | PRN

## 2019-11-23 MED ORDER — TRAZODONE HCL 50 MG PO TABS: 50.0000 mg | ORAL_TABLET | Freq: Every evening | ORAL | 2 refills | Status: DC | PRN

## 2019-11-23 MED ORDER — MIRTAZAPINE 45 MG PO TABS: 45.0000 mg | ORAL_TABLET | Freq: Every day | ORAL | 2 refills | Status: DC

## 2019-11-23 MED ORDER — SERTRALINE HCL 100 MG PO TABS: 150.0000 mg | ORAL_TABLET | Freq: Every day | ORAL | 2 refills | Status: DC

## 2019-11-23 MED FILL — traZODone HCL 50 MG TABS: 50 | 30 days supply | Qty: 30 | Fill #0

## 2019-11-23 MED FILL — MIRTAZAPINE 45 MG TABLET: 45 | 30 days supply | Qty: 30 | Fill #0

## 2019-11-23 MED FILL — hydrOXYzine HCL 10 MG TABS: 10 | 30 days supply | Qty: 90 | Fill #0

## 2019-11-23 MED FILL — SERTRALINE HCL 100 MG TAB: 100 | 30 days supply | Qty: 45 | Fill #0

## 2019-11-23 NOTE — Progress Notes (Signed)
Psychiatric Initial Adult Assessment  Virtual Visit via Video Note  I connected with Mickeal Daws O'Neil on 11/23/19 at 11:00 AM EDT by a video enabled telemedicine application and verified that I am speaking with the correct person using two identifiers.  Location: Patient: Home Provider: Clinic   I discussed the limitations of evaluation and management by telemedicine and the availability of in person appointments. The patient expressed understanding and agreed to proceed.  I provided 45 minutes of non-face-to-face time during this encounter.     Patient Identification: Craig Flores MRN:  [id number] Date of Evaluation:  11/23/2019 Referral Source: Beverly Sessions Chief Complaint:  "My anxiety is debilitating" Visit Diagnosis:    ICD-10-CM   1. Generalized anxiety disorder  F41.1 sertraline (ZOLOFT) 100 MG tablet    mirtazapine (REMERON) 45 MG tablet    hydrOXYzine (ATARAX/VISTARIL) 10 MG tablet  2. Moderate episode of recurrent major depressive disorder (HCC)  F33.1 sertraline (ZOLOFT) 100 MG tablet    mirtazapine (REMERON) 45 MG tablet    traZODone (DESYREL) 50 MG tablet    History of Present Illness:  54 year old male seen today for initial psychiatric evaluation. He was referred to outpatient psychiatry by [facility] for medication management. He has a psychiatric history of alcohol dependence, substance induced mood disorder, anxiety, depression, and SI. He is currently being managed on Zoloft 150mg  daily and Mirtazapine 30mg  at nightly. He has tried propranolol in the past for anxiety but does not feel like it helped.   Today she is well groomed, pleasant, cooperative, engaged in conversation and maintained eye contact. He describes his mood as anxious and depressed which he reported has worsened recently. A PHQ-9 was completed today, and patient scored 15. A GAD-7 was completed today, and patient scored 18. He states he feels the anxiety is more debilitating to him than the  depression. Patient admits his wife is an alcoholic and her behavior worsens his anxiety. He states he only drinks alcohol when his wife needs him to drink with her. He smokes about .75 PPD of cigarettes, and he is actively trying to quit.    Patient reports he sometimes has trouble sleeping and has a poor appetite every day. He states he had a major heart attack in August 2021 and lost about a third of his heart. He states he is now having a lot of fatigue and has trouble getting through things and coping with the effects of the heart attack. He states he just goes through life doing things because must do them. He admits to feeling worthless and hopeless. He denies panic attacks with his anxiety but endorses racing thoughts and difficulty concentrating.  Patient denies SI/HI/VAH, impulsive behaviors, delusions, or paranoia.    Patient agrees to increasing dose of mirtazapine to 45mg  at night. He also agrees starting Trazodone 50mg  PRN at night to help manage sleep. He will also start  Hydroxyzine 10mg  three times daily as need for anxiety. Potential side effects of medication and risks vs benefits of treatment vs non-treatment were explained and discussed. All questions were answered.Patient agrees to continuing all other medications as previously prescribed.  No other concerns noted at this time.   Associated Signs/Symptoms: Depression Symptoms:  depressed mood, anhedonia, insomnia, fatigue, feelings of worthlessness/guilt, difficulty concentrating, hopelessness, anxiety, loss of energy/fatigue, weight loss, (Hypo) Manic Symptoms:  Irritable Mood, Anxiety Symptoms:  Excessive Worry, Psychotic Symptoms:  Denies PTSD Symptoms: Had a traumatic exposure:  Notes that his wife abuse alcohol which is traumatic  Past Psychiatric  History: Alcohol dependence, substance induced mood disorder, depression, and anxiety,  Previous Psychotropic Medications: Yes   Substance Abuse History in the last 12  months:  No.  Consequences of Substance Abuse: NA  Past Medical History:  Past Medical History:  Diagnosis Date  . Acid reflux   . CHF (congestive heart failure) ([place])   . Depression   . Migraines   . Myocardial infarction [facility])     Past Surgical History:  Procedure Laterality Date  . CORONARY/GRAFT ACUTE MI REVASCULARIZATION N/A 08/28/2019   Procedure: Coronary/Graft Acute MI Revascularization;  Surgeon: [name], MD;  Location: [place] CV LAB;  Service: Cardiovascular;  Laterality: N/A;  . LEFT HEART CATH AND CORONARY ANGIOGRAPHY N/A 08/28/2019   Procedure: LEFT HEART CATH AND CORONARY ANGIOGRAPHY;  Surgeon: [name], MD;  Location: [place] CV LAB;  Service: Cardiovascular;  Laterality: N/A;    Family Psychiatric History: Notes that his mother and aunt had mental health issues. Notes he belives they had anxiety  Family History:  Family History  Problem Relation Age of Onset  . Arrhythmia Father     Social History:   Social History   Socioeconomic History  . Marital status: Married    Spouse name: Not on file  . Number of children: Not on file  . Years of education: Not on file  . Highest education level: Not on file  Occupational History  . Not on file  Tobacco Use  . Smoking status: Current Every Day Smoker    Packs/day: 0.50    Types: Cigarettes    Last attempt to quit: 09/05/2019    Years since quitting: 0.2  . Smokeless tobacco: Never Used  . Tobacco comment: x15 yrs as of 2021  Vaping Use  . Vaping Use: Never used  Substance and Sexual Activity  . Alcohol use: Not Currently    Comment: previous use but has not drank in months  . Drug use: Never  . Sexual activity: Yes    Birth control/protection: None  Other Topics Concern  . Not on file  Social History Narrative  . Not on file   Social Determinants of Health   Financial Resource Strain:   . Difficulty of Paying Living Expenses: Not on file  Food Insecurity:   . Worried  About Charity fundraiser in the Last Year: Not on file  . Ran Out of Food in the Last Year: Not on file  Transportation Needs:   . Lack of Transportation (Medical): Not on file  . Lack of Transportation (Non-Medical): Not on file  Physical Activity:   . Days of Exercise per Week: Not on file  . Minutes of Exercise per Session: Not on file  Stress:   . Feeling of Stress : Not on file  Social Connections:   . Frequency of Communication with Friends and Family: Not on file  . Frequency of Social Gatherings with Friends and Family: Not on file  . Attends Religious Services: Not on file  . Active Member of Clubs or Organizations: Not on file  . Attends Archivist Meetings: Not on file  . Marital Status: Not on file    Additional Social History: Patient resides in [place] with his wife. He has two children. 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 He endorse smoking a little more than half a pack a day of cigarettes. He reports occasional drinks alcohol.   Allergies:  No Known  Allergies  Metabolic Disorder Labs: Lab Results  Component Value Date   HGBA1C 5.8 (H) 08/28/2019   MPG 119.76 08/28/2019   No results found for: PROLACTIN Lab Results  Component Value Date   CHOL 117 11/17/2019   TRIG 126 11/17/2019   HDL 28 (L) 11/17/2019   CHOLHDL 4.2 11/17/2019   VLDL 25 11/17/2019   LDLCALC 64 11/17/2019   LDLCALC UNABLE TO CALCULATE IF TRIGLYCERIDE OVER 400 mg/dL 08/30/2019   Lab Results  Component Value Date   TSH 3.181 08/28/2019    Therapeutic Level Labs: No results found for: LITHIUM No results found for: CBMZ No results found for: VALPROATE  Current Medications: Current Outpatient Medications  Medication Sig Dispense Refill  . mirtazapine (REMERON) 45 MG tablet Take 1 tablet (45 mg total) by mouth at bedtime. 30 tablet 2  . aspirin 81 MG chewable tablet Chew 1 tablet (81 mg total) by mouth daily. 30 tablet 11  .  atorvastatin (LIPITOR) 80 MG tablet Take 1 tablet (80 mg total) by mouth daily. 30 tablet 5  . carvedilol (COREG) 3.125 MG tablet Take 1 tablet (3.125 mg total) by mouth 2 (two) times daily with a meal. 60 tablet 5  . colchicine 0.6 MG tablet Take 1 tablet (0.6 mg total) by mouth daily. 30 tablet 2  . digoxin (LANOXIN) 0.125 MG tablet Take 1 tablet (0.125 mg total) by mouth daily. 30 tablet 5  . furosemide (LASIX) 20 MG tablet Take 1 tablet (20 mg total) by mouth as needed for fluid or edema (take if you gain more than 5 lb in a week). 30 tablet 11  . hydrOXYzine (ATARAX/VISTARIL) 10 MG tablet Take 1 tablet (10 mg total) by mouth 3 (three) times daily as needed. 90 tablet 2  . Multiple Vitamin (MULTIVITAMIN WITH MINERALS) TABS tablet Take 1 tablet by mouth daily.    Marland Kitchen omega-3 acid ethyl esters (LOVAZA) 1 g capsule Take 2 capsules (2 g total) by mouth 2 (two) times daily. 60 capsule 5  . omeprazole (PRILOSEC OTC) 20 MG tablet Take 1 tablet (20 mg total) by mouth 2 (two) times daily.    . sacubitril-valsartan (ENTRESTO) 24-26 MG Take 1 tablet by mouth 2 (two) times daily. 60 tablet 3  . sertraline (ZOLOFT) 100 MG tablet Take 1.5 tablets (150 mg total) by mouth daily. 45 tablet 2  . SILDENAFIL CITRATE PO Take 1-2 tablets by mouth as directed. 30 mg tablet - take 1 - 2 tablets 30 to 45 min before sexual activity as needed    . spironolactone (ALDACTONE) 25 MG tablet Take 0.5 tablets (12.5 mg total) by mouth daily. 30 tablet 5  . traZODone (DESYREL) 50 MG tablet Take 1 tablet (50 mg total) by mouth at bedtime as needed for sleep. 30 tablet 2  . warfarin (COUMADIN) 5 MG tablet TAKE 1.5 TABLETS (7.5 MG TOTAL) BY MOUTH DAILY EXCEPT ONLY TAKE 2 TABLETS (10 MG TOTAL) BY MOUTH ON SUNDAYS, TUESDAYS AND THURSDAYS 55 tablet 1   No current facility-administered medications for this visit.    Musculoskeletal: Strength & Muscle Tone: Unable to assess due to telehealth visit [place]: Unable to assess due  to telehealth visit Patient leans: N/A  Psychiatric Specialty Exam: Review of Systems  There were no vitals taken for this visit.There is no height or weight on file to calculate BMI.  General Appearance: Well Groomed  Eye Contact:  Good  Speech:  Clear and Coherent and Normal Rate  Volume:  Normal  Mood:  Anxious and Depressed  Affect:  Appropriate and Congruent  Thought Process:  Coherent, Goal Directed and Linear  Orientation:  Full (Time, Place, and Person)  Thought Content:  WDL and Logical  Suicidal Thoughts:  No  Homicidal Thoughts:  No  Memory:  Immediate;   Good Recent;   Good Remote;   Good  Judgement:  Good  Insight:  Good  Psychomotor Activity:  Normal  Concentration:  Concentration: Good and Attention Span: Good  Recall:  Good  Fund of Knowledge:Good  Language: Good  Akathisia:  No  Handed:  Right  AIMS (if indicated):  Not done  Assets:  Communication Skills Desire for Improvement Financial Resources/Insurance Housing Social Support  ADL's:  Intact  Cognition: WNL  Sleep:  Fair   Screenings: AIMS     Admission (Discharged) from 04/26/2014 in [place] [facility]  AIMS Total Score 0    AUDIT     Admission (Discharged) from 04/26/2014 in [place] [facility]  Alcohol Use Disorder Identification Test Final Score (AUDIT) 19    GAD-7     Video Visit from 11/23/2019 in [facility] Counselor from 11/04/2019 in [facility]  Total GAD-7 Score 18 13    PHQ2-9     Video Visit from 11/23/2019 in [facility] Counselor from 11/04/2019 in [facility]  PHQ-2 Total Score 5 6  PHQ-9 Total Score 15 16      Assessment and Plan: Patient endorses increased anxiety and depression related to life stressors.  He also notes that his sleep is disturbed some nights.  He is agreeable to increasing Remeron to 45 mg  to help manage anxiety, sleep.  He is also agreeable to starting trazodone 25 mg to 50 mg nightly for sleep.  He will start hydroxyzine 10 mg 3 times daily anxiety.  1. Generalized anxiety disorder  Continue- sertraline (ZOLOFT) 100 MG tablet; Take 1.5 tablets (150 mg total) by mouth daily.  Dispense: 45 tablet; Refill: 2 Increased- mirtazapine (REMERON) 45 MG tablet; Take 1 tablet (45 mg total) by mouth at bedtime.  Dispense: 30 tablet; Refill: 2 Start- hydrOXYzine (ATARAX/VISTARIL) 10 MG tablet; Take 1 tablet (10 mg total) by mouth 3 (three) times daily as needed.  Dispense: 90 tablet; Refill: 2  2. Moderate episode of recurrent major depressive disorder (HCC)  Continue- sertraline (ZOLOFT) 100 MG tablet; Take 1.5 tablets (150 mg total) by mouth daily.  Dispense: 45 tablet; Refill: 2 Increased- mirtazapine (REMERON) 45 MG tablet; Take 1 tablet (45 mg total) by mouth at bedtime.  Dispense: 30 tablet; Refill: 2 Start- traZODone (DESYREL) 50 MG tablet; Take 1 tablet (50 mg total) by mouth at bedtime as needed for sleep.  Dispense: 30 tablet; Refill: 2  Follow up in 3 months  Salley Slaughter, NP 10/27/202111:46 AM

## 2019-11-24 ENCOUNTER — Other Ambulatory Visit (HOSPITAL_COMMUNITY)
Admission: RE | Admit: 2019-11-24 | Discharge: 2019-11-24 | Disposition: A | Discharge status: Home / Self Care | Payer: Medicaid Other | Source: Ambulatory Visit | Attending: Internal Medicine | Admitting: Internal Medicine

## 2019-11-24 ENCOUNTER — Other Ambulatory Visit: Payer: Medicaid Other | Admitting: *Deleted

## 2019-11-24 ENCOUNTER — Ambulatory Visit (INDEPENDENT_AMBULATORY_CARE_PROVIDER_SITE_OTHER): Payer: Medicaid Other | Admitting: *Deleted

## 2019-11-24 ENCOUNTER — Other Ambulatory Visit: Payer: Self-pay

## 2019-11-24 DIAGNOSIS — Z7901 Long term (current) use of anticoagulants: Secondary | ICD-10-CM

## 2019-11-24 DIAGNOSIS — Z79899 Other long term (current) drug therapy: Secondary | ICD-10-CM

## 2019-11-24 DIAGNOSIS — I513 Intracardiac thrombosis, not elsewhere classified: Secondary | ICD-10-CM

## 2019-11-24 DIAGNOSIS — Z5181 Encounter for therapeutic drug level monitoring: Secondary | ICD-10-CM | POA: Diagnosis not present

## 2019-11-24 DIAGNOSIS — I5021 Acute systolic (congestive) heart failure: Secondary | ICD-10-CM

## 2019-11-24 DIAGNOSIS — Z20822 Contact with and (suspected) exposure to covid-19: Secondary | ICD-10-CM | POA: Insufficient documentation

## 2019-11-24 DIAGNOSIS — Z01812 Encounter for preprocedural laboratory examination: Secondary | ICD-10-CM

## 2019-11-24 DIAGNOSIS — I255 Ischemic cardiomyopathy: Secondary | ICD-10-CM

## 2019-11-24 LAB — BASIC METABOLIC PANEL
BUN/Creatinine Ratio: 15 (ref 9–20)
BUN: 13 mg/dL (ref 6–24)
CO2: 24 mmol/L (ref 20–29)
Calcium: 9.4 mg/dL (ref 8.7–10.2)
Chloride: 103 mmol/L (ref 96–106)
Creatinine, Ser: 0.87 mg/dL (ref 0.76–1.27)
GFR calc Af Amer: 113 mL/min/{1.73_m2} (ref >59)
GFR calc non Af Amer: 98 mL/min/{1.73_m2} (ref >59)
Glucose: 130 mg/dL — ABNORMAL HIGH (ref 65–99)
Potassium: 4.1 mmol/L (ref 3.5–5.2)
Sodium: 139 mmol/L (ref 134–144)

## 2019-11-24 LAB — PROTIME-INR
INR: 2.3 — ABNORMAL HIGH (ref 0.9–1.2)
Prothrombin Time: 23.5 s — ABNORMAL HIGH (ref 9.1–12.0)

## 2019-11-24 LAB — CBC
Hematocrit: 39.2 % (ref 37.5–51.0)
Hemoglobin: 13.3 g/dL (ref 13.0–17.7)
MCH: 30.6 pg (ref 26.6–33.0)
MCHC: 33.9 g/dL (ref 31.5–35.7)
MCV: 90 fL (ref 79–97)
Platelets: 330 10*3/uL (ref 150–450)
RBC: 4.35 x10E6/uL (ref 4.14–5.80)
RDW: 15.8 % — ABNORMAL HIGH (ref 11.6–15.4)
WBC: 8.7 10*3/uL (ref 3.4–10.8)

## 2019-11-24 LAB — POCT INR: INR: 2.8 (ref 2.0–3.0)

## 2019-11-24 LAB — SARS CORONAVIRUS 2 (TAT 6-24 HRS): SARS Coronavirus 2: NEGATIVE

## 2019-11-24 MED FILL — OMEGA-3 ETHYL ESTERS 1 GM C: 1 | 15 days supply | Qty: 60 | Fill #2

## 2019-11-24 MED FILL — ENTRESTO 24 MG-26 MG TABLET: 24-26 | 30 days supply | Qty: 60 | Fill #0

## 2019-11-24 NOTE — Patient Instructions (Signed)
Description   Continue taking Warfarin 1.5 tablets daily except for 2 tablets on Sunday, Tuesday and Thursday. Be consistent with your 2 servings of greens. Recheck INR in 2 weeks. Call coumadin clinic for any changes in medications or upcoming procedures. [phone].

## 2019-11-25 ENCOUNTER — Telehealth: Payer: Self-pay | Admitting: *Deleted

## 2019-11-25 NOTE — Telephone Encounter (Signed)
Left message to call back.  (need to instruct of Coumadin dosing for the next 3 days, prior to planned ICD implant on Monday)

## 2019-11-25 NOTE — Telephone Encounter (Signed)
Patient returning call.

## 2019-11-25 NOTE — Telephone Encounter (Signed)
Per Dr. Caryl Comes: pt instructed to take 1/2 his Coumadin dose tonight and tomorrow, take regular full dose Sunday evening. Patient verbalized understanding and agreeable to plan.

## 2019-11-28 ENCOUNTER — Ambulatory Visit (HOSPITAL_COMMUNITY): Payer: Medicaid Other

## 2019-11-28 ENCOUNTER — Ambulatory Visit (HOSPITAL_COMMUNITY)
Admission: RE | Admit: 2019-11-28 | Discharge: 2019-11-28 | Disposition: A | Discharge status: Home / Self Care | Payer: Medicaid Other | Source: Ambulatory Visit | Attending: Internal Medicine | Admitting: Internal Medicine

## 2019-11-28 ENCOUNTER — Ambulatory Visit (HOSPITAL_COMMUNITY)
Admission: RE | Disposition: A | Discharge status: Home / Self Care | Payer: Self-pay | Source: Ambulatory Visit | Attending: Internal Medicine

## 2019-11-28 DIAGNOSIS — I5022 Chronic systolic (congestive) heart failure: Secondary | ICD-10-CM | POA: Insufficient documentation

## 2019-11-28 DIAGNOSIS — I959 Hypotension, unspecified: Secondary | ICD-10-CM | POA: Insufficient documentation

## 2019-11-28 DIAGNOSIS — Z95810 Presence of automatic (implantable) cardiac defibrillator: Secondary | ICD-10-CM

## 2019-11-28 DIAGNOSIS — F17210 Nicotine dependence, cigarettes, uncomplicated: Secondary | ICD-10-CM | POA: Insufficient documentation

## 2019-11-28 DIAGNOSIS — I252 Old myocardial infarction: Secondary | ICD-10-CM | POA: Insufficient documentation

## 2019-11-28 DIAGNOSIS — Z8249 Family history of ischemic heart disease and other diseases of the circulatory system: Secondary | ICD-10-CM | POA: Diagnosis not present

## 2019-11-28 DIAGNOSIS — R000 Tachycardia, unspecified: Secondary | ICD-10-CM | POA: Insufficient documentation

## 2019-11-28 DIAGNOSIS — Z006 Encounter for examination for normal comparison and control in clinical research program: Secondary | ICD-10-CM | POA: Diagnosis not present

## 2019-11-28 DIAGNOSIS — I513 Intracardiac thrombosis, not elsewhere classified: Secondary | ICD-10-CM | POA: Insufficient documentation

## 2019-11-28 DIAGNOSIS — I255 Ischemic cardiomyopathy: Secondary | ICD-10-CM | POA: Diagnosis present

## 2019-11-28 HISTORY — PX: ICD IMPLANT: EP1208

## 2019-11-28 LAB — PROTIME-INR
INR: 1.9 — ABNORMAL HIGH (ref 0.8–1.2)
Prothrombin Time: 20.8 seconds — ABNORMAL HIGH (ref 11.4–15.2)

## 2019-11-28 SURGERY — ICD IMPLANT

## 2019-11-28 MED ORDER — SODIUM CHLORIDE 0.9 % IV SOLN: INTRAVENOUS | Status: DC

## 2019-11-28 MED ORDER — ONDANSETRON HCL 4 MG/2ML IJ SOLN: 4.0000 mg | Freq: Four times a day (QID) | INTRAMUSCULAR | Status: DC | PRN

## 2019-11-28 MED ORDER — SODIUM CHLORIDE 0.9 % IV BOLUS
250.0000 mL | Freq: Once | INTRAVENOUS | Status: AC
  Administered 2019-11-28: 250 mL via INTRAVENOUS

## 2019-11-28 MED ORDER — SODIUM CHLORIDE 0.9 % IV SOLN
80.0000 mg | INTRAVENOUS | Status: AC
  Administered 2019-11-28: 80 mg
  Filled 2019-11-28: qty 2

## 2019-11-28 MED ORDER — HEPARIN (PORCINE) IN NACL 1000-0.9 UT/500ML-% IV SOLN
INTRAVENOUS | Status: AC
  Filled 2019-11-28: qty 500

## 2019-11-28 MED ORDER — FENTANYL CITRATE (PF) 100 MCG/2ML IJ SOLN
INTRAMUSCULAR | Status: DC | PRN
  Administered 2019-11-28: 50 ug via INTRAVENOUS
  Administered 2019-11-28: 25 ug via INTRAVENOUS

## 2019-11-28 MED ORDER — FENTANYL CITRATE (PF) 100 MCG/2ML IJ SOLN
INTRAMUSCULAR | Status: AC
  Filled 2019-11-28: qty 2

## 2019-11-28 MED ORDER — MIDAZOLAM HCL 5 MG/5ML IJ SOLN
INTRAMUSCULAR | Status: DC | PRN
  Administered 2019-11-28: 2 mg via INTRAVENOUS
  Administered 2019-11-28: 1 mg via INTRAVENOUS

## 2019-11-28 MED ORDER — LIDOCAINE HCL (PF) 1 % IJ SOLN
INTRAMUSCULAR | Status: DC | PRN
  Administered 2019-11-28: 60 mL

## 2019-11-28 MED ORDER — HEPARIN (PORCINE) IN NACL 1000-0.9 UT/500ML-% IV SOLN
INTRAVENOUS | Status: DC | PRN
  Administered 2019-11-28: 500 mL

## 2019-11-28 MED ORDER — MIDAZOLAM HCL 5 MG/5ML IJ SOLN
INTRAMUSCULAR | Status: AC
  Filled 2019-11-28: qty 5

## 2019-11-28 MED ORDER — CEFAZOLIN SODIUM-DEXTROSE 2-4 GM/100ML-% IV SOLN
2.0000 g | INTRAVENOUS | Status: AC
  Administered 2019-11-28: 2 g via INTRAVENOUS

## 2019-11-28 MED ORDER — LIDOCAINE HCL 1 % IJ SOLN
INTRAMUSCULAR | Status: AC
  Filled 2019-11-28: qty 60

## 2019-11-28 MED ORDER — SODIUM CHLORIDE 0.9 % IV SOLN
INTRAVENOUS | Status: AC
  Filled 2019-11-28: qty 2

## 2019-11-28 MED ORDER — CEFAZOLIN SODIUM-DEXTROSE 2-4 GM/100ML-% IV SOLN
INTRAVENOUS | Status: AC
  Filled 2019-11-28: qty 100

## 2019-11-28 MED ORDER — ACETAMINOPHEN 325 MG PO TABS
325.0000 mg | ORAL_TABLET | ORAL | Status: DC | PRN
  Filled 2019-11-28: qty 2

## 2019-11-28 SURGICAL SUPPLY — 7 items
CABLE SURGICAL S-101-97-12 (CABLE) ×2 IMPLANT
HEMOSTAT SURGICEL 2X4 FIBR (HEMOSTASIS) ×2 IMPLANT
ICD MOMENTUM D120 (ICD Generator) ×2 IMPLANT
LEAD RELIANCE 0138-64 (Lead) ×2 IMPLANT
PAD PRO RADIOLUCENT 2001M-C (PAD) ×2 IMPLANT
SHEATH 9.5FR PRELUDE SNAP 13 (SHEATH) ×2 IMPLANT
TRAY PACEMAKER INSERTION (PACKS) ×2 IMPLANT

## 2019-11-28 NOTE — Progress Notes (Signed)
Discharge instructions reviewed with pt wife Voice understanding.

## 2019-11-28 NOTE — Progress Notes (Signed)
Discharge instructions reviewed with pt attempted to call pt wife. No answer text message sent for her to call when she can.

## 2019-11-28 NOTE — Progress Notes (Signed)
Craig Kilts, Pa called and informed of Bp and heart rate. Tele called and states that the pt has dropped into the 40's at times. [name] informed. Pt has no complaints.

## 2019-11-28 NOTE — Discharge Instructions (Signed)
ACTIVITYAfter Your ICD (Implantable Cardiac Defibrillator)   . 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 Do not lift your arm above shoulder height for 1 week after your procedure. After 7 days, you may progress as below.     Monday December 05, 2019  Tuesday December 06, 2019 Wednesday December 07, 2019 Thursday December 08, 2019   . Do not lift, push, pull, or carry anything over 10 pounds with the affected arm until 6 weeks (Monday January 09, 2020 ) after your procedure.   . Do NOT DRIVE until you have been seen for your wound check, or as long as instructed by your healthcare provider.   . Ask your healthcare provider when you can go back to work   INCISION/Dressing . If you are on a blood thinner such as Coumadin, Xarelto, Eliquis, Plavix, or Pradaxa please confirm with your provider when this should be resumed.  . Monitor your defibrillator site for redness, swelling, and drainage. Call the device clinic at [phone] if you experience these symptoms or fever/chills.  . If your incision is closed with Dermabond/Surgical glue. You may shower 1 day after your pacemaker implant and wash around the site with soap and water.    Marland Kitchen Avoid lotions, ointments, or perfumes over your incision until it is well-healed.  . You may use a hot tub or a pool AFTER your wound check appointment if the incision is completely closed.  . Your ICD is designed to protect you from life threatening heart rhythms. Because of this, you may receive a shock.   o 1 shock with no symptoms:  Call the office during business hours. o 1 shock with symptoms (chest pain, chest pressure, dizziness, lightheadedness, shortness of breath, overall feeling unwell):  Call 911. o If you experience 2 or more shocks in 24 hours:  Call 911. o If you receive a shock, you should not drive for 6 months per the [place] DMV IF you receive appropriate therapy from your ICD.   . ICD Alerts:  Some alerts are vibratory and others  beep. These are NOT emergencies. Please call our office to let us know. If this occurs at night or on weekends, it can wait until the next business day. Send a remote transmission.  . If your device is capable of reading fluid status (for heart failure), you will be offered monthly monitoring to review this with you.   DEVICE MANAGEMENT . Remote monitoring is used to monitor your ICD from home. This monitoring is scheduled every 91 days by our office. It allows [place] to keep an eye on the functioning of your device to ensure it is working properly. You will routinely see your Electrophysiologist annually (more often if necessary).   . You should receive your ID card for your new device in 4-8 weeks. Keep this card with you at all times once received. Consider wearing a medical alert bracelet or necklace.  . Your ICD  may be MRI compatible. This will be discussed at your next office visit/wound check.  You should avoid contact with strong electric or magnetic fields.    Do not use amateur (ham) radio equipment or electric (arc) welding torches. MP3 player headphones with magnets should not be used. Some devices are safe to use if held at least 12 inches (30 cm) from your defibrillator. These include power tools, lawn mowers, and speakers. If you are unsure if something is safe to use, ask your health care provider.   When  using your cell phone, hold it to the ear that is on the opposite side from the defibrillator. Do not leave your cell phone in a pocket over the defibrillator.   You may safely use electric blankets, heating pads, computers, and microwave ovens.  Call the office right away if:  You have chest pain.  You feel more than one shock.  You feel more short of breath than you have felt before.  You feel more light-headed than you have felt before.  Your incision starts to open up.  This information is not intended to replace advice given to you by your health care provider. Make  sure you discuss any questions you have with your health care provider.    After Your ICD (Implantable Cardiac Defibrillator)   . 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 Do not lift your arm above shoulder height for 1 week after your procedure. After 7 days, you may progress as below.     Monday December 05, 2019  Tuesday December 06, 2019 Wednesday December 07, 2019 Thursday December 08, 2019   . Do not lift, push, pull, or carry anything over 10 pounds with the affected arm until 6 weeks (Monday January 09, 2020 ) after your procedure.   . Do NOT DRIVE until you have been seen for your wound check, or as long as instructed by your healthcare provider.   . Ask your healthcare provider when you can go back to work   INCISION/Dressing . If you are on a blood thinner such as Coumadin, Xarelto, Eliquis, Plavix, or Pradaxa please confirm with your provider when this should be resumed.  . Monitor your defibrillator site for redness, swelling, and drainage. Call the device clinic at [phone] if you experience these symptoms or fever/chills.  . If your incision is closed with Dermabond/Surgical glue. You may shower 1 day after your pacemaker implant and wash around the site with soap and water.    Marland Kitchen Avoid lotions, ointments, or perfumes over your incision until it is well-healed.  . You may use a hot tub or a pool AFTER your wound check appointment if the incision is completely closed.  . Your ICD is designed to protect you from life threatening heart rhythms. Because of this, you may receive a shock.   o 1 shock with no symptoms:  Call the office during business hours. o 1 shock with symptoms (chest pain, chest pressure, dizziness, lightheadedness, shortness of breath, overall feeling unwell):  Call 911. o If you experience 2 or more shocks in 24 hours:  Call 911. o If you receive a shock, you should not drive for 6 months per the [place] DMV IF you receive appropriate therapy  from your ICD.   . ICD Alerts:  Some alerts are vibratory and others beep. These are NOT emergencies. Please call our office to let us know. If this occurs at night or on weekends, it can wait until the next business day. Send a remote transmission.  . If your device is capable of reading fluid status (for heart failure), you will be offered monthly monitoring to review this with you.   DEVICE MANAGEMENT . Remote monitoring is used to monitor your ICD from home. This monitoring is scheduled every 91 days by our office. It allows [place] to keep an eye on the functioning of your device to ensure it is working properly. You will routinely see your Electrophysiologist annually (more often if necessary).   . You should  receive your ID card for your new device in 4-8 weeks. Keep this card with you at all times once received. Consider wearing a medical alert bracelet or necklace.  . Your ICD  may be MRI compatible. This will be discussed at your next office visit/wound check.  You should avoid contact with strong electric or magnetic fields.    Do not use amateur (ham) radio equipment or electric (arc) welding torches. MP3 player headphones with magnets should not be used. Some devices are safe to use if held at least 12 inches (30 cm) from your defibrillator. These include power tools, lawn mowers, and speakers. If you are unsure if something is safe to use, ask your health care provider.   When using your cell phone, hold it to the ear that is on the opposite side from the defibrillator. Do not leave your cell phone in a pocket over the defibrillator.   You may safely use electric blankets, heating pads, computers, and microwave ovens.  Call the office right away if:  You have chest pain.  You feel more than one shock.  You feel more short of breath than you have felt before.  You feel more light-headed than you have felt before.  Your incision starts to open up.  This information is not  intended to replace advice given to you by your health care provider. Make sure you discuss any questions you have with your health care provider.

## 2019-11-28 NOTE — Interval H&P Note (Signed)
ICD Criteria  Current LVEF:25%. Within 12 months prior to implant: Yes   Heart failure history: Yes, Class III  Cardiomyopathy history: Yes, Ischemic Cardiomyopathy - Prior MI.  Atrial Fibrillation/Atrial Flutter: No.  Ventricular tachycardia history: No.  Cardiac arrest history: No.  History of syndromes with risk of sudden death: No.  Previous ICD: No.  Current ICD indication: Primary  PPM indication: No.  Class I or II Bradycardia indication present: No  Beta Blocker therapy for 3 or more months: Yes, prescribed.   Ace Inhibitor/ARB therapy for 3 or more months: Yes, prescribed.    I have seen Craig Flores is a 54 y.o. malepre-procedural and has been referred by DM for consideration of ICD implant for primary prevention of sudden death.  The patient's chart has been reviewed and they meet criteria for ICD implant.  I have had a thorough discussion with the patient reviewing options.  The patient and their family (if available) have had opportunities to ask questions and have them answered. The patient and I have decided together through the [place] [employer] to implant ICD at this time.  Risks, benefits, alternatives to ICD implantation were discussed in detail with the patient today. The patient  understands that the risks include but are not limited to bleeding, infection, pneumothorax, perforation, tamponade, vascular damage, renal failure, MI, stroke, death, inappropriate shocks, and lead dislodgement and  wishes to proceed.  History and Physical Interval Note:  11/28/2019 9:01 AM  [place]  has presented today for surgery, with the diagnosis of cardiomyopathy.  The various methods of treatment have been discussed with the patient and family. After consideration of risks, benefits and other options for treatment, the patient has consented to  Procedure(s): ICD IMPLANT (N/A) as a surgical intervention.  The patient's history has been  reviewed, patient examined, no change in status, stable for surgery.  I have reviewed the patient's chart and labs.  Questions were answered to the patient's satisfaction.     Craig Flores

## 2019-11-28 NOTE — H&P (Signed)
Patient Care Team: Pcp, No as PCP - General Craig Flores as PCP - Cardiology (Cardiology)   HPI  Craig Flores Has is a 54 y.o. male admitted for ICD implantation for primary prevention following late presentation of anterior MI  The patient denies chest pain,  nocturnal dyspnea, orthopnea or peripheral edema.  There have been no palpitations, lightheadedness or syncope.   Mild DOE with stairs   DATE TEST EF   8/21 [facility]   LAD T w/ collaterals  8//21 Echo   45 % LV thrombus  9/21 Echo  25-30%    Date Cr K Hgb  9/21 0.93 4.9 13.5(8/21)           Records and Results Reviewed   Past Medical History:  Diagnosis Date  . Acid reflux   . CHF (congestive heart failure) ([place])   . Depression   . Migraines   . Myocardial infarction [facility])     Past Surgical History:  Procedure Laterality Date  . CORONARY/GRAFT ACUTE MI REVASCULARIZATION N/A 08/28/2019   Procedure: Coronary/Graft Acute MI Revascularization;  Surgeon: Craig Flores;  Location: [place] [facility];  Service: Cardiovascular;  Laterality: N/A;  . LEFT HEART CATH AND CORONARY ANGIOGRAPHY N/A 08/28/2019   Procedure: LEFT HEART CATH AND CORONARY ANGIOGRAPHY;  Surgeon: Craig Flores;  Location: [place] [facility];  Service: Cardiovascular;  Laterality: N/A;    Current Facility-Administered Medications  Medication Dose Route Frequency Provider Last Rate Last Admin  . 0.9 %  sodium chloride infusion   Intravenous Continuous Deboraha Sprang, Flores 50 mL/hr at 11/28/19 0631 New Bag at 11/28/19 0631  . 0.9 %  sodium chloride infusion   Intravenous Continuous Deboraha Sprang, Flores 50 mL/hr at 11/28/19 0631 New Bag at 11/28/19 0631  . ceFAZolin (ANCEF) IVPB 2g/100 mL premix  2 g Intravenous On Call Deboraha Sprang, Flores      . gentamicin (GARAMYCIN) 80 mg in sodium chloride 0.9 % 500 mL irrigation  80 mg Irrigation On Call Deboraha Sprang, Flores        No Known Allergies    Social History   Tobacco  Use  . Smoking status: Current Every Day Smoker    Packs/day: 0.50    Types: Cigarettes    Last attempt to quit: 09/05/2019    Years since quitting: 0.2  . Smokeless tobacco: Never Used  . Tobacco comment: x15 yrs as of 2021  Vaping Use  . Vaping Use: Never used  Substance Use Topics  . Alcohol use: Not Currently    Comment: previous use but has not drank in months  . Drug use: Never     Family History  Problem Relation Age of Onset  . Arrhythmia Father      Current Meds  Medication Sig  . acetaminophen (TYLENOL) 500 MG tablet Take 500 mg by mouth every 6 (six) hours as needed for headache.  Marland Kitchen aspirin 81 MG chewable tablet Chew 1 tablet (81 mg total) by mouth daily.  Marland Kitchen atorvastatin (LIPITOR) 80 MG tablet Take 1 tablet (80 mg total) by mouth daily.  . carvedilol (COREG) 3.125 MG tablet Take 1 tablet (3.125 mg total) by mouth 2 (two) times daily with a meal.  . colchicine 0.6 MG tablet Take 1 tablet (0.6 mg total) by mouth daily.  . digoxin (LANOXIN) 0.125 MG tablet Take 1 tablet (0.125 mg total) by mouth daily.  . furosemide (LASIX) 20 MG  tablet Take 1 tablet (20 mg total) by mouth as needed for fluid or edema (take if you gain more than 5 lb in a week). (Patient taking differently: Take 20 mg by mouth daily as needed for fluid or edema (take if you gain more than 5 lb in a week). )  . hydrOXYzine (ATARAX/VISTARIL) 10 MG tablet Take 1 tablet (10 mg total) by mouth 3 (three) times daily as needed. (Patient taking differently: Take 10 mg by mouth 3 (three) times daily as needed for anxiety. )  . losartan (COZAAR) 25 MG tablet Take 25 mg by mouth in the morning and at bedtime.  . mirtazapine (REMERON) 45 MG tablet Take 1 tablet (45 mg total) by mouth at bedtime.  . Multiple Vitamin (MULTIVITAMIN WITH MINERALS) TABS tablet Take 1 tablet by mouth daily.  Marland Kitchen omega-3 acid ethyl esters (LOVAZA) 1 g capsule Take 2 capsules (2 g total) by mouth 2 (two) times daily.  Marland Kitchen omeprazole (PRILOSEC OTC)  20 MG tablet Take 1 tablet (20 mg total) by mouth 2 (two) times daily. (Patient taking differently: Take 40 mg by mouth daily as needed (Heartburn). )  . sacubitril-valsartan (ENTRESTO) 24-26 MG Take 1 tablet by mouth 2 (two) times daily.  . sertraline (ZOLOFT) 100 MG tablet Take 1.5 tablets (150 mg total) by mouth daily.  Marland Kitchen SILDENAFIL CITRATE PO Take 1-2 tablets by mouth daily as needed (ED). 30 mg tablet - take 1 - 2 tablets 30 to 45 min before sexual activity as needed  . spironolactone (ALDACTONE) 25 MG tablet Take 0.5 tablets (12.5 mg total) by mouth daily.  . traZODone (DESYREL) 50 MG tablet Take 1 tablet (50 mg total) by mouth at bedtime as needed for sleep.  Marland Kitchen warfarin (COUMADIN) 5 MG tablet TAKE 1.5 TABLETS (7.5 MG TOTAL) BY MOUTH DAILY EXCEPT ONLY TAKE 2 TABLETS (10 MG TOTAL) BY MOUTH ON SUNDAYS, TUESDAYS AND THURSDAYS (Patient taking differently: Take 7.5-10 mg by mouth See admin instructions. Take 10 mg Sun., Tues., and Thursday.  Take 7.5 Mg all the other day)     Review of Systems negative except from HPI and PMH  Physical Exam BP (!) 89/69   Pulse 71   Temp 98 F (36.7 C) (Oral)   Resp 17   Ht 5\' 9"  (1.753 m)   Wt 79.4 kg   SpO2 96%   BMI 25.84 kg/m  Well developed and well nourished in no acute distress HENT normal E scleral and icterus clear Neck Supple JVP flat; carotids brisk and full Clear to ausculation  Regular rate and rhythm, no murmurs gallops or rub Soft with active bowel sounds No clubbing cyanosis  Edema Alert and oriented, grossly normal motor and sensory function Skin Warm and Dry    Assessment and  Plan  Ischemic cardiomyopathy status post late presenting anterior wall MI  Congestive heart failure-chronic-systolic class IIb  Sinus "tachycardia "-relative  Hypotension  Mural thrombus   For ICD implant for primary prevention Have reviewed the potential benefits and risks of ICD implantation including but not limited to death,  perforation of heart or lung, lead dislodgement, infection,  device malfunction and inappropriate shocks.  The patient express understanding  and are willing to proceed.    willstop losartan as now on entresto

## 2019-11-28 NOTE — Progress Notes (Signed)
Oda Kilts, PA called and informed of Bp new orders noted

## 2019-11-28 NOTE — Progress Notes (Signed)
Dr Caryl Comes in to see pt

## 2019-11-29 ENCOUNTER — Other Ambulatory Visit: Payer: Self-pay

## 2019-11-29 ENCOUNTER — Encounter (HOSPITAL_COMMUNITY): Payer: Self-pay | Admitting: Internal Medicine

## 2019-11-29 ENCOUNTER — Ambulatory Visit (HOSPITAL_COMMUNITY): Payer: Medicaid Other | Admitting: Clinical

## 2019-11-29 ENCOUNTER — Telehealth (HOSPITAL_COMMUNITY): Payer: Self-pay | Admitting: Clinical

## 2019-11-29 MED FILL — Lidocaine HCl Local Inj 1%: INTRAMUSCULAR | Qty: 60 | Status: AC

## 2019-11-29 NOTE — Telephone Encounter (Signed)
Therapist sent the client a text message link via mychart for the scheduled virtual therapy visit. Client did not respond to the link sent. Therapist followed up with a tele -phone call to the clients cell phone number. Therapist was unable to reach the client and left a voice mail to call the office back.

## 2019-11-30 ENCOUNTER — Encounter (HOSPITAL_COMMUNITY): Payer: Self-pay | Admitting: Internal Medicine

## 2019-12-01 ENCOUNTER — Other Ambulatory Visit: Payer: Self-pay | Admitting: Cardiology

## 2019-12-02 MED FILL — ATORVASTATIN CALCIUM 80 MG: 80 | 30 days supply | Qty: 30 | Fill #2

## 2019-12-02 MED FILL — SPIRONOLACTONE 25 MG TABLET: 25 | 30 days supply | Qty: 15 | Fill #2

## 2019-12-02 MED FILL — CARVEDILOL 3.125 MG TABLET: 3.125 | 30 days supply | Qty: 60 | Fill #2

## 2019-12-02 MED FILL — WARFARIN SODIUM 5 MG TABLET: 5 | 28 days supply | Qty: 48 | Fill #1

## 2019-12-02 MED FILL — DIGOXIN 0.125 MG TABLET: 125 | 30 days supply | Qty: 30 | Fill #2

## 2019-12-05 ENCOUNTER — Other Ambulatory Visit: Payer: Self-pay | Admitting: Cardiology

## 2019-12-05 ENCOUNTER — Ambulatory Visit (HOSPITAL_COMMUNITY)
Admission: RE | Admit: 2019-12-05 | Discharge: 2019-12-05 | Disposition: A | Discharge status: Home / Self Care | Payer: Medicaid Other | Source: Ambulatory Visit | Attending: Cardiology | Admitting: Cardiology

## 2019-12-05 ENCOUNTER — Other Ambulatory Visit: Payer: Self-pay

## 2019-12-05 VITALS — BP 104/64 | HR 86 | Wt 177.8 lb

## 2019-12-05 DIAGNOSIS — Z7982 Long term (current) use of aspirin: Secondary | ICD-10-CM | POA: Diagnosis not present

## 2019-12-05 DIAGNOSIS — E785 Hyperlipidemia, unspecified: Secondary | ICD-10-CM | POA: Diagnosis not present

## 2019-12-05 DIAGNOSIS — I5022 Chronic systolic (congestive) heart failure: Secondary | ICD-10-CM | POA: Diagnosis not present

## 2019-12-05 DIAGNOSIS — I255 Ischemic cardiomyopathy: Secondary | ICD-10-CM | POA: Insufficient documentation

## 2019-12-05 DIAGNOSIS — Z95810 Presence of automatic (implantable) cardiac defibrillator: Secondary | ICD-10-CM | POA: Insufficient documentation

## 2019-12-05 DIAGNOSIS — F17200 Nicotine dependence, unspecified, uncomplicated: Secondary | ICD-10-CM | POA: Diagnosis not present

## 2019-12-05 DIAGNOSIS — I252 Old myocardial infarction: Secondary | ICD-10-CM | POA: Diagnosis not present

## 2019-12-05 DIAGNOSIS — I2510 Atherosclerotic heart disease of native coronary artery without angina pectoris: Secondary | ICD-10-CM | POA: Insufficient documentation

## 2019-12-05 MED ORDER — SPIRONOLACTONE 25 MG PO TABS
25.0000 mg | ORAL_TABLET | Freq: Every day | ORAL | 11 refills | Status: DC
Start: 2019-12-05 — End: 2019-12-05

## 2019-12-05 MED ORDER — MITIGARE 0.6 MG PO CAPS: 0.6000 mg | ORAL_CAPSULE | Freq: Every day | ORAL | 2 refills | Status: DC

## 2019-12-05 MED FILL — MITIGARE 0.6 MG CAPS: 0.6 | 30 days supply | Qty: 30 | Fill #0

## 2019-12-05 NOTE — Patient Instructions (Signed)
It was a pleasure seeing you today!  MEDICATIONS: -We are changing your medications today -Increase spironolactone to 25 mg (1 tablet) daily. -Call if you have questions about your medications.   NEXT APPOINTMENT: Return to clinic in 3 weeks with Pharmacy Clinic.  In general, to take care of your heart failure: -Limit your fluid intake to 2 Liters (half-gallon) per day.   -Limit your salt intake to ideally 2-3 grams (2000-3000 mg) per day. -Weigh yourself daily and record, and bring that "weight diary" to your next appointment.  (Weight gain of 2-3 pounds in 1 day typically means fluid weight.) -The medications for your heart are to help your heart and help you live longer.   -Please contact us before stopping any of your heart medications.  Call the clinic at [phone] with questions or to reschedule future appointments.

## 2019-12-06 ENCOUNTER — Telehealth (HOSPITAL_COMMUNITY): Payer: Self-pay | Admitting: Pharmacist

## 2019-12-06 NOTE — Telephone Encounter (Signed)
Patient Advocate Encounter   Received notification from [facility] Medicaid that prior authorization for Omega 3 ethyl esters is required.   PA submitted on [facility] Tracks Confirmation #: [id number] W Recipient ID: [id number] O Status is pending   Will continue to follow.   Audry Riles, PharmD, BCPS, BCCP, CPP Heart Failure Clinic Pharmacist [phone]

## 2019-12-06 NOTE — Telephone Encounter (Signed)
Advanced Heart Failure Patient Advocate Encounter  Prior Authorization for Omega 3 ethyl esters  has been approved.    PA# [id number] Effective dates: 12/06/2019 - 12/05/2020  Patients co-pay is $3.00  Audry Riles, PharmD, BCPS, BCCP, CPP Heart Failure Clinic Pharmacist [phone]

## 2019-12-08 ENCOUNTER — Other Ambulatory Visit: Payer: Self-pay

## 2019-12-08 ENCOUNTER — Ambulatory Visit (INDEPENDENT_AMBULATORY_CARE_PROVIDER_SITE_OTHER): Payer: Medicaid Other | Admitting: Emergency Medicine

## 2019-12-08 ENCOUNTER — Ambulatory Visit (INDEPENDENT_AMBULATORY_CARE_PROVIDER_SITE_OTHER): Payer: Medicaid Other | Admitting: Pharmacist

## 2019-12-08 DIAGNOSIS — I513 Intracardiac thrombosis, not elsewhere classified: Secondary | ICD-10-CM | POA: Diagnosis not present

## 2019-12-08 DIAGNOSIS — Z5181 Encounter for therapeutic drug level monitoring: Secondary | ICD-10-CM | POA: Diagnosis not present

## 2019-12-08 DIAGNOSIS — I255 Ischemic cardiomyopathy: Secondary | ICD-10-CM

## 2019-12-08 DIAGNOSIS — Z7901 Long term (current) use of anticoagulants: Secondary | ICD-10-CM

## 2019-12-08 LAB — CUP PACEART INCLINIC DEVICE CHECK
Brady Statistic RV Percent Paced: 1 % — CL
Date Time Interrogation Session: 20211111114709
HighPow Impedance: 73 Ohm
Implantable Lead Implant Date: 20211101
Implantable Lead Location: 753860
Implantable Lead Model: 138
Implantable Lead Serial Number: 303500
Implantable Pulse Generator Implant Date: 20211101
Lead Channel Impedance Value: 561 Ohm
Lead Channel Pacing Threshold Amplitude: 0.8 V
Lead Channel Pacing Threshold Pulse Width: 0.4 ms
Lead Channel Sensing Intrinsic Amplitude: 20.5 mV
Lead Channel Setting Pacing Amplitude: 3.5 V
Lead Channel Setting Pacing Pulse Width: 0.4 ms
Lead Channel Setting Sensing Sensitivity: 0.5 mV
Pulse Gen Serial Number: 209902

## 2019-12-08 LAB — POCT INR: INR: 2.3 (ref 2.0–3.0)

## 2019-12-08 NOTE — Progress Notes (Signed)
Wound check appointment. Dermabond removed. Wound without redness or edema. Incision edges approximated, wound well healed. Normal device function. Thresholds, sensing, and impedances consistent with implant measurements. Device programmed at 3.5V for extra safety margin until 3 month visit. Histogram distribution appropriate for patient and level of activity. No ventricular arrhythmias noted. Patient educated about wound care, arm mobility, lifting restrictions, shock plan. Pt. is enrolled in remote monitoring, next scheduled check 02/28/20.  ROV with Dr. Caryl Comes 03/02/20.

## 2019-12-08 NOTE — Patient Instructions (Signed)
Continue taking Warfarin 1.5 tablets daily except for 2 tablets on Sunday, Tuesday and Thursday. Be consistent with your 2 servings of greens. Recheck INR in 3 weeks. Call coumadin clinic for any changes in medications or upcoming procedures. [phone].

## 2019-12-13 ENCOUNTER — Ambulatory Visit (HOSPITAL_COMMUNITY)
Admission: RE | Admit: 2019-12-13 | Discharge: 2019-12-13 | Disposition: A | Discharge status: Home / Self Care | Payer: Medicaid Other | Source: Ambulatory Visit | Attending: Internal Medicine | Admitting: Internal Medicine

## 2019-12-13 ENCOUNTER — Other Ambulatory Visit: Payer: Self-pay

## 2019-12-13 DIAGNOSIS — E782 Mixed hyperlipidemia: Secondary | ICD-10-CM | POA: Diagnosis present

## 2019-12-13 DIAGNOSIS — I255 Ischemic cardiomyopathy: Secondary | ICD-10-CM | POA: Diagnosis not present

## 2019-12-13 LAB — LIPID PANEL
Cholesterol: 143 mg/dL (ref 0–200)
HDL: 37 mg/dL — ABNORMAL LOW (ref >40)
LDL Cholesterol: 67 mg/dL (ref 0–99)
Total CHOL/HDL Ratio: 3.9 RATIO
Triglycerides: 196 mg/dL — ABNORMAL HIGH (ref <150)
VLDL: 39 mg/dL (ref 0–40)

## 2019-12-13 LAB — HEPATIC FUNCTION PANEL
ALT: 33 U/L (ref 0–44)
AST: 23 U/L (ref 15–41)
Albumin: 3.9 g/dL (ref 3.5–5.0)
Alkaline Phosphatase: 61 U/L (ref 38–126)
Bilirubin, Direct: 0.1 mg/dL (ref 0.0–0.2)
Indirect Bilirubin: 0.3 mg/dL (ref 0.3–0.9)
Total Bilirubin: 0.4 mg/dL (ref 0.3–1.2)
Total Protein: 7 g/dL (ref 6.5–8.1)

## 2019-12-21 MED FILL — SPIRONOLACTONE 25 MG TABLET: 25 | 30 days supply | Qty: 30 | Fill #0

## 2019-12-21 MED FILL — SERTRALINE HCL 100 MG TAB: 100 | 30 days supply | Qty: 45 | Fill #1

## 2019-12-21 MED FILL — ENTRESTO 24 MG-26 MG TABLET: 24-26 | 30 days supply | Qty: 60 | Fill #1

## 2019-12-21 MED FILL — MIRTAZAPINE 45 MG TABLET: 45 | 30 days supply | Qty: 30 | Fill #1

## 2019-12-27 ENCOUNTER — Other Ambulatory Visit (HOSPITAL_COMMUNITY): Payer: Self-pay | Admitting: Cardiology

## 2019-12-27 ENCOUNTER — Telehealth (HOSPITAL_COMMUNITY): Payer: Self-pay | Admitting: Pharmacist

## 2019-12-27 ENCOUNTER — Other Ambulatory Visit: Payer: Self-pay

## 2019-12-27 ENCOUNTER — Telehealth (HOSPITAL_COMMUNITY): Payer: Self-pay

## 2019-12-27 ENCOUNTER — Encounter (HOSPITAL_COMMUNITY): Payer: Self-pay

## 2019-12-27 ENCOUNTER — Ambulatory Visit (HOSPITAL_COMMUNITY)
Admission: RE | Admit: 2019-12-27 | Discharge: 2019-12-27 | Disposition: A | Discharge status: Home / Self Care | Payer: Medicaid Other | Source: Ambulatory Visit | Attending: Pharmacist | Admitting: Pharmacist

## 2019-12-27 VITALS — BP 108/78 | HR 81 | Wt 180.8 lb

## 2019-12-27 DIAGNOSIS — I5022 Chronic systolic (congestive) heart failure: Secondary | ICD-10-CM | POA: Insufficient documentation

## 2019-12-27 DIAGNOSIS — Z7902 Long term (current) use of antithrombotics/antiplatelets: Secondary | ICD-10-CM | POA: Insufficient documentation

## 2019-12-27 DIAGNOSIS — Z79899 Other long term (current) drug therapy: Secondary | ICD-10-CM | POA: Diagnosis not present

## 2019-12-27 DIAGNOSIS — I255 Ischemic cardiomyopathy: Secondary | ICD-10-CM | POA: Insufficient documentation

## 2019-12-27 DIAGNOSIS — F17210 Nicotine dependence, cigarettes, uncomplicated: Secondary | ICD-10-CM | POA: Diagnosis not present

## 2019-12-27 DIAGNOSIS — I5021 Acute systolic (congestive) heart failure: Secondary | ICD-10-CM

## 2019-12-27 DIAGNOSIS — I513 Intracardiac thrombosis, not elsewhere classified: Secondary | ICD-10-CM | POA: Insufficient documentation

## 2019-12-27 DIAGNOSIS — Z7983 Long term (current) use of bisphosphonates: Secondary | ICD-10-CM | POA: Diagnosis not present

## 2019-12-27 DIAGNOSIS — I2510 Atherosclerotic heart disease of native coronary artery without angina pectoris: Secondary | ICD-10-CM | POA: Diagnosis present

## 2019-12-27 DIAGNOSIS — R0602 Shortness of breath: Secondary | ICD-10-CM | POA: Diagnosis not present

## 2019-12-27 DIAGNOSIS — E785 Hyperlipidemia, unspecified: Secondary | ICD-10-CM | POA: Diagnosis not present

## 2019-12-27 DIAGNOSIS — I252 Old myocardial infarction: Secondary | ICD-10-CM | POA: Diagnosis not present

## 2019-12-27 DIAGNOSIS — I241 Dressler's syndrome: Secondary | ICD-10-CM | POA: Diagnosis not present

## 2019-12-27 LAB — BASIC METABOLIC PANEL
Anion gap: 10 (ref 5–15)
BUN: 15 mg/dL (ref 6–20)
CO2: 22 mmol/L (ref 22–32)
Calcium: 9.3 mg/dL (ref 8.9–10.3)
Chloride: 105 mmol/L (ref 98–111)
Creatinine, Ser: 0.81 mg/dL (ref 0.61–1.24)
GFR, Estimated: >60 mL/min (ref >60)
Glucose, Bld: 84 mg/dL (ref 70–99)
Potassium: 4.4 mmol/L (ref 3.5–5.1)
Sodium: 137 mmol/L (ref 135–145)

## 2019-12-27 MED ORDER — DAPAGLIFLOZIN PROPANEDIOL 10 MG PO TABS: 10.0000 mg | ORAL_TABLET | Freq: Every day | ORAL | 11 refills | Status: DC

## 2019-12-27 MED FILL — FUROSEMIDE 20 MG TABS: 20 | 30 days supply | Qty: 60 | Fill #2

## 2019-12-27 MED FILL — FARXIGA 10 MG TABLET: 10 | 30 days supply | Qty: 30 | Fill #0

## 2019-12-27 MED FILL — WARFARIN SODIUM 5 MG TABLET: 5 | 8 days supply | Qty: 14 | Fill #2

## 2019-12-27 NOTE — Telephone Encounter (Signed)
Pt insurance is active through [place]. [id number]  Will fax over Medicaid Reimbursement form to Dr. Aundra Dubin  Will contact pt to see if he is interested in the Cardiac Rehab program. If interested, pt will need to complete f/u appt on.

## 2019-12-27 NOTE — Telephone Encounter (Signed)
Attempted to call patient in regards to Cardiac Rehab - LM on VM Mailed letter 

## 2019-12-27 NOTE — Patient Instructions (Signed)
It was a pleasure seeing you today!  MEDICATIONS: -We are changing your medications today -Start Farxiga 10 mg daily -Call if you have questions about your medications.  LABS: -We will call you if your labs need attention.  NEXT APPOINTMENT: Return to clinic in 1 month with Dr. Aundra Dubin.  In general, to take care of your heart failure: -Limit your fluid intake to 2 Liters (half-gallon) per day.   -Limit your salt intake to ideally 2-3 grams (2000-3000 mg) per day. -Weigh yourself daily and record, and bring that "weight diary" to your next appointment.  (Weight gain of 2-3 pounds in 1 day typically means fluid weight.) -The medications for your heart are to help your heart and help you live longer.   -Please contact us before stopping any of your heart medications.  Call the clinic at [phone] with questions or to reschedule future appointments.

## 2019-12-27 NOTE — Progress Notes (Signed)
Referring Physician: HF Cardiology: Dr. Aundra Dubin  HPI:  54 y.o.male smoker wasadmitted on 8/1/21for delayed presentation anterior STEMI. Had had >36 hr of chest pain prior to seeking medical attention. Pain progressed to more pleuritic like CP. Found to have anterior ST elevations w/ precordial Q waves on admit. Hs troponin >27,000. Urgent cardiac cath showed totally occluded mid LAD w/o collaterals and 40% mid RCA stenosis. It was suspected he had completed his infarct and residual pleuritic CP was post-MI pericarditis. No intervention was performed. 2D echo demonstrated moderately reduced LVEF, 45-50%, + apical thrombus and moderate AS. MV normal. RV systolic function mildly reduced.   He was placed on medical management for CAD and systolic HF. He was started on ASA, atorvastatin, Losartan and carvedilol. Warfarin started for apical thrombusw/ heparin bridge.   Post cath, there were initial concerns for developing cardiogenic shock but he remained stable and did not require pressor/inotropic support. Co-ox remained stable. Repeat echo showed further reduction of LVEF down to 30-35% but no post MI mechanical complications. RV mildly reduced. He did require IV furosemide for pulmonary edema and volume status and dyspnea improved w/ diuresis. He was continued on GDMT w/ losartan, spironolactone and carvedilol. BP too soft for Entresto. He was treated w/ colchicine for post MI pericarditis w/ improvement in pleuritic chest pain. Given anterior MI w/ EF <35%, he was fitted w/ a LifeVest prior to discharge w/ plans to get repeat echo in 1 month.  He had repeat echo in 9/21, showing EF 25-30%. RV normal.  He recently returned to HF Clinic for followup of CHF and CAD with Dr. Aundra Dubin on 11/17/19. He seemed to be doing generally well. Occasional sensation of fullness in his chest but no pain. Using furosemide prn, but still takes 4-5 times/week. No lightheadedness. He reported walking for 30-45  minutes with no problems on flat ground. He reported getting short of breath walking up stairs or up a hill. No orthopnea/PND. Still smoking <1 ppd. He is an Chief Strategy Officer, working on a novel.    On 11/28/19, ICD was placed by Dr. Caryl Comes.   He returned to HF clinic on 12/05/19 for pharmacist medication titration. At last visit with MD, losartan was discontinued and Entresto 24/26 mg BID was initiated. Overall he was feeling well. Had occasional dizziness which was not bothersome and not worse since changing to [facility]. No chest pain or palpitations. Could walk for 40-60 min before getting SOB, notably more SOB going up stairs. Took furosemide PRN approximately 3-4 times per week. No LEE, PND or orthopnea. Was taking all medications as prescribed and tolerating all medications.   Today he returns to HF clinic for pharmacist medication titration. At last visit with pharmacist, spironolactone was increased to 25 mg daily. He is overall feeling good. He has some lightheadedness occasionally but this is stable and not bothersome. Denies dizziness, fatigue, chest pain, or palpitations. His breathing is unchanged and is able to walk 30 minutes on flat ground without feeling SOB. Weight is up 3 lbs since last visit. Taking furosemide PRN approximately 2-3 times per week when his weight is up and when his lungs feel "more full." No PND or orthopnea. Appetite is down but this is unchanged from last visit. He has not been adhering to low-salt diet as much recently.  HF Medications: Carvedilol 3.125 mg BID Entresto 24/26 mg BID Spironolactone 25 mg daily Digoxin 0.125 mg daily Furosemide 20 mg PRN  Has the patient been experiencing any side effects to the medications  prescribed?  no  Does the patient have any problems obtaining medications due to transportation or finances?   No - [place] Medicaid  Understanding of regimen: good Understanding of indications: good Potential of compliance: good Patient understands to  avoid NSAIDs. Patient understands to avoid decongestants.    Pertinent Lab Values: . Serum creatinine 0.81, BUN 15, Potassium 4.4, Sodium 137  Vital Signs: . Weight: 180.8 lbs (last clinic weight: 177.8 lbs) . Blood pressure: 108/78  . Heart rate: 81   Assessment: 1. PJS:[id number]/92 for acute anterior MI, delayed presentation with no ischemic pain, had pleuritic-type chest pain (post-MI pericarditis). LHC with totally occluded mid LAD w/o collaterals. Suspected completed MI, no intervention. Plan medical management.No ischemic chest pain. - Continue ASA 81 mg daily. Not on clopidogrel because on dual warfarin/aspirin.  - Continue atorvastatin 80 mg daily -Refer to cardiac rehab.  2.Chronic systolic KMQ:[id number] Cardiomyopathy. Reduced EF post anterior MI. Echo in 9/21with EF 25-30%, peri-apical akinesis, moderate AS. Cath w/ occluded mLAD but no intervention as he had completed his infarct. - Currently NYHA class II, not volume overloaded on exam.  - Received [employer] ICD on 11/28/19 - Continue furosemide 20 mg PRN - Continue carvedilol 3.125 mg BID. - Continue Entresto 24/26 mg BID.   - Continuespironolactone 25 mg daily - Start Farxiga 10 mg daily. Gave patient 30-day free card and will submit prior authorization for future fills. - Continue digoxin 0.125 mg daily.Last level 0.7 ng/mL on 11/17/19.   3. Post MI Pericarditis: Pleuritic CP resolved.  -ContinueColchicine 0.6 mg dailyx 3 months.   4. LV thrombus: Large LV thrombus noted on echoin 8/21, resolved on 9/21 echo. -Continue warfarin.  5. H/o Tobacco Abuse:Still smoking, needs to quit.  6. NHA:FBXUXYBF atorvastatin    Plan: 1) Medication changes: Based on clinical presentation, vital signs and recent labs will start Farxiga 10 mg daily. 2) Follow-up with Dr. Aundra Dubin on 01/30/20   Audry Riles, PharmD, BCPS, BCCP, CPP Heart Failure Clinic Pharmacist [phone]

## 2019-12-27 NOTE — Telephone Encounter (Signed)
Patient Advocate Encounter   Received notification from [facility] Medicaid that prior authorization for Wilder [place]ade is required.   PA submitted on [facility] Tracks Confirmation #: [id number] W Recipient ID: [id number] O Status is pending   Will continue to follow.  Audry Riles, PharmD, BCPS, BCCP, CPP Heart Failure Clinic Pharmacist [phone]

## 2019-12-28 NOTE — Telephone Encounter (Signed)
Advanced Heart Failure Patient Advocate Encounter  Prior Authorization for Wilder Glade has been approved.    PA# [id number] Effective dates: 12/27/2019 - 12/26/2020  Patients co-pay is $3.00  Audry Riles, PharmD, BCPS, BCCP, CPP Heart Failure Clinic Pharmacist [phone]

## 2019-12-29 ENCOUNTER — Telehealth: Payer: Self-pay | Admitting: *Deleted

## 2019-12-29 NOTE — Addendum Note (Signed)
Addended by: Rollen Sox on: 12/29/2019 07:22 AM   Modules accepted: Level of Service

## 2019-12-29 NOTE — Telephone Encounter (Signed)
Pt missed appt today; called to reschedule but there was no answer so left a message to call back to reschedule.

## 2020-01-03 ENCOUNTER — Ambulatory Visit (INDEPENDENT_AMBULATORY_CARE_PROVIDER_SITE_OTHER): Payer: Medicaid Other | Admitting: Clinical

## 2020-01-03 ENCOUNTER — Telehealth (HOSPITAL_COMMUNITY): Payer: Self-pay

## 2020-01-03 ENCOUNTER — Other Ambulatory Visit: Payer: Self-pay

## 2020-01-03 DIAGNOSIS — F331 Major depressive disorder, recurrent, moderate: Secondary | ICD-10-CM

## 2020-01-03 NOTE — Telephone Encounter (Signed)
No response from pt.  Closed referral  

## 2020-01-04 ENCOUNTER — Other Ambulatory Visit: Payer: Self-pay | Admitting: Cardiology

## 2020-01-04 ENCOUNTER — Ambulatory Visit (INDEPENDENT_AMBULATORY_CARE_PROVIDER_SITE_OTHER): Payer: Medicaid Other | Admitting: *Deleted

## 2020-01-04 ENCOUNTER — Other Ambulatory Visit: Payer: Self-pay

## 2020-01-04 DIAGNOSIS — Z5181 Encounter for therapeutic drug level monitoring: Secondary | ICD-10-CM | POA: Diagnosis not present

## 2020-01-04 DIAGNOSIS — I513 Intracardiac thrombosis, not elsewhere classified: Secondary | ICD-10-CM | POA: Diagnosis not present

## 2020-01-04 DIAGNOSIS — Z7901 Long term (current) use of anticoagulants: Secondary | ICD-10-CM

## 2020-01-04 LAB — POCT INR: INR: 2.8 (ref 2.0–3.0)

## 2020-01-04 MED ORDER — WARFARIN SODIUM 5 MG PO TABS
ORAL_TABLET | ORAL | 2 refills | Status: DC
Start: 2020-01-04 — End: 2020-04-11

## 2020-01-04 MED FILL — MITIGARE 0.6 MG CAPS: 0.6 | 30 days supply | Qty: 30 | Fill #1

## 2020-01-04 MED FILL — WARFARIN SODIUM 5 MG TABLET: 5 | 27 days supply | Qty: 55 | Fill #0

## 2020-01-04 MED FILL — DIGOXIN 0.125 MG TABLET: 125 | 30 days supply | Qty: 30 | Fill #3

## 2020-01-04 MED FILL — CARVEDILOL 3.125 MG TABLET: 3.125 | 30 days supply | Qty: 60 | Fill #3

## 2020-01-04 MED FILL — FARXIGA 10 MG TABLET: 10 | 30 days supply | Qty: 30 | Fill #0

## 2020-01-04 MED FILL — ATORVASTATIN CALCIUM 80 MG: 80 | 30 days supply | Qty: 30 | Fill #3

## 2020-01-04 NOTE — Patient Instructions (Signed)
Description   Continue taking Warfarin 1.5 tablets daily except for 2 tablets on Sunday, Tuesday and Thursday. Be consistent with your 2 servings of greens. Recheck INR in 6 weeks. Call coumadin clinic for any changes in medications or upcoming procedures. [phone].   BILL [id number]     

## 2020-01-06 NOTE — Progress Notes (Signed)
   THERAPIST PROGRESS NOTE Virtual Visit via Video Note  I connected with Craig Flores on 01/03/2020 at 10:00 AM EST by a video enabled telemedicine application and verified that I am speaking with the correct person using two identifiers.  Location: Patient: home Provider: office   I discussed the limitations of evaluation and management by telemedicine and the availability of in person appointments. The patient expressed understanding and agreed to proceed.   Follow Up Instructions: I discussed the assessment and treatment plan with the patient. The patient was provided an opportunity to ask questions and all were answered. The patient agreed with the plan and demonstrated an understanding of the instructions.   The patient was advised to call back or seek an in-person evaluation if the symptoms worsen or if the condition fails to improve as anticipated.    Session Time: 30 minutes  Participation Level: Active  Behavioral Response: CasualAlertpleasant  Type of Therapy: Individual Therapy  Treatment Goals addressed: Diagnosis: depression  Interventions: CBT  Summary:  Craig Flores is a 54 y.o. male who presents for the scheduled session oriented times five, appropriately dressed, and friendly. Client denied hallucinations and delusions. Client reported on today "its been a decent day". Client reported since the last session he has had a heart procedure and has been at home recovering. Client discussed "I don't know how I feel" in relation to coping with the transition of his health. Client reported the heart attack he had earlier this year left permanent damage and he now has a defibrillator. Client reported he is trying to focus on the quality of life he has and making it feel like "its worth being around". Client reported he should apply for disability but he has a desire to find work he can do from home. Client reported a barrier to his ability to cope has been to  figure out how to accept and live with his wife's lack of support. 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 Client discussed over time he was focused on being the provider for his family and putting everyone else first that he did not build anything outside of family. Client reported he goes back and forth with having good and bad days but is motivated to work on prioritizing his personal interests and well being.    Suicidal/Homicidal: Nowithout intent/plan  Therapist Response:  Therapist began the session checking in and asking how he has been doing since last seen. Therapist actively listened to the clients thoughts and feelings. Therapist used empathy in response to the client. Therapist used CBT to ask open ended questions about the clients stressors in there connection to his depressive thoughts. Therapist engaged with the client to discuss his personal interests and instruct the client to start back with his hobbies. Client was scheduled for next appointment.     Plan: Return again in 3 weeks for individual therapy.  Diagnosis: Moderate episode of recurrent major depressive disorder   Novaleigh Kohlman Y Sonia Bromell, LCSW 01/06/2020

## 2020-01-24 MED FILL — SPIRONOLACTONE 25 MG TABLET: 25 | 30 days supply | Qty: 30 | Fill #1

## 2020-01-24 MED FILL — SERTRALINE HCL 100 MG TAB: 100 | 30 days supply | Qty: 45 | Fill #2

## 2020-01-24 MED FILL — ENTRESTO 24 MG-26 MG TABLET: 24-26 | 30 days supply | Qty: 60 | Fill #2

## 2020-01-24 MED FILL — MIRTAZAPINE 45 MG TABLET: 45 | 30 days supply | Qty: 30 | Fill #2

## 2020-01-25 ENCOUNTER — Other Ambulatory Visit: Payer: Self-pay

## 2020-01-25 ENCOUNTER — Ambulatory Visit (HOSPITAL_COMMUNITY): Payer: Medicaid Other | Admitting: Clinical

## 2020-01-30 ENCOUNTER — Ambulatory Visit (HOSPITAL_COMMUNITY)
Admission: RE | Admit: 2020-01-30 | Discharge: 2020-01-30 | Disposition: A | Discharge status: Home / Self Care | Payer: Medicaid Other | Source: Ambulatory Visit | Attending: Cardiology | Admitting: Cardiology

## 2020-01-30 ENCOUNTER — Other Ambulatory Visit: Payer: Self-pay

## 2020-01-30 ENCOUNTER — Encounter (HOSPITAL_COMMUNITY): Payer: Self-pay | Admitting: Cardiology

## 2020-01-30 ENCOUNTER — Other Ambulatory Visit (HOSPITAL_COMMUNITY): Payer: Self-pay | Admitting: Cardiology

## 2020-01-30 VITALS — BP 122/80 | HR 86 | Wt 178.8 lb

## 2020-01-30 DIAGNOSIS — Z95810 Presence of automatic (implantable) cardiac defibrillator: Secondary | ICD-10-CM | POA: Diagnosis not present

## 2020-01-30 DIAGNOSIS — I5022 Chronic systolic (congestive) heart failure: Secondary | ICD-10-CM | POA: Insufficient documentation

## 2020-01-30 DIAGNOSIS — Z7984 Long term (current) use of oral hypoglycemic drugs: Secondary | ICD-10-CM | POA: Diagnosis not present

## 2020-01-30 DIAGNOSIS — I2510 Atherosclerotic heart disease of native coronary artery without angina pectoris: Secondary | ICD-10-CM | POA: Insufficient documentation

## 2020-01-30 DIAGNOSIS — Z79899 Other long term (current) drug therapy: Secondary | ICD-10-CM | POA: Diagnosis not present

## 2020-01-30 DIAGNOSIS — E785 Hyperlipidemia, unspecified: Secondary | ICD-10-CM | POA: Insufficient documentation

## 2020-01-30 DIAGNOSIS — F17210 Nicotine dependence, cigarettes, uncomplicated: Secondary | ICD-10-CM | POA: Insufficient documentation

## 2020-01-30 DIAGNOSIS — I513 Intracardiac thrombosis, not elsewhere classified: Secondary | ICD-10-CM | POA: Insufficient documentation

## 2020-01-30 DIAGNOSIS — I255 Ischemic cardiomyopathy: Secondary | ICD-10-CM | POA: Insufficient documentation

## 2020-01-30 DIAGNOSIS — Z7901 Long term (current) use of anticoagulants: Secondary | ICD-10-CM | POA: Diagnosis not present

## 2020-01-30 DIAGNOSIS — Z7982 Long term (current) use of aspirin: Secondary | ICD-10-CM | POA: Insufficient documentation

## 2020-01-30 DIAGNOSIS — I252 Old myocardial infarction: Secondary | ICD-10-CM | POA: Diagnosis not present

## 2020-01-30 LAB — BASIC METABOLIC PANEL
Anion gap: 12 (ref 5–15)
BUN: 16 mg/dL (ref 6–20)
CO2: 26 mmol/L (ref 22–32)
Calcium: 9.9 mg/dL (ref 8.9–10.3)
Chloride: 98 mmol/L (ref 98–111)
Creatinine, Ser: 1.1 mg/dL (ref 0.61–1.24)
GFR, Estimated: >60 mL/min (ref >60)
Glucose, Bld: 101 mg/dL — ABNORMAL HIGH (ref 70–99)
Potassium: 4 mmol/L (ref 3.5–5.1)
Sodium: 136 mmol/L (ref 135–145)

## 2020-01-30 LAB — DIGOXIN LEVEL: Digoxin Level: 0.5 ng/mL — ABNORMAL LOW (ref 0.8–2.0)

## 2020-01-30 MED ORDER — CARVEDILOL 6.25 MG PO TABS: 6.2500 mg | ORAL_TABLET | Freq: Two times a day (BID) | ORAL | 3 refills | Status: DC

## 2020-01-30 MED FILL — CARVEDILOL 6.25 MG TABLET: 6.25 | 30 days supply | Qty: 60 | Fill #0

## 2020-01-30 NOTE — Progress Notes (Signed)
Advanced Heart Failure Clinic Note   Referring Physician: HF Cardiology: Dr. Aundra Dubin  Reason for Visit: Heart Failure    HPI: 55 y.o. male smoker was admitted on 08/28/19 for delayed presentation anterior STEMI. Had had >36 hr of chest pain prior to seeking medical attention. Pain progressed to more pleuritic like CP. Found to have anterior ST elevations w/ precordial Q waves on admit. Hs troponin >27,000. Urgent cardiac cath showed totally occluded mid LAD w/o collaterals and 40% mid RCA stenosis. It was suspected he had completed his infarct and residual pleuritic CP was post-MI pericarditis. No intervention was performed. 2D echo demonstrated moderately reduced LVEF, 45-50%, + apical thrombus and moderate AS. MV normal. RV systolic function mildly reduced.   He was placed on medical management for CAD and systolic HF. He was started on ASA, atorvastatin, Losartan and Coreg. Coumadin started for apical thrombus w/ heparin bridge.   Post cath, there were initial concerns for developing cardiogenic shock but he remained stable and did not require pressor/inotropic support. Co-ox remained stable. Repeat echo showed further reduction of LVEF down to 30-35% but no post MI mechanical complications. RV mildly reduced. He did require IV Lasix for pulmonary edema and volume status and dyspnea improved w/ diuresis. He was continued on GTDMT w/ losartan, spironolactone and Coreg. BP too soft for Entresto. He was treated w/ colchicine for post MI pericarditis w/ improvement in pleuritic chest pain. Given anterior MI w/ EF <35%, he was fitted w/ a LifeVest prior to discharge w/ plans to get repeat echo in 1 month.   He had repeat echo in 9/21, showing EF 25-30%.  He had [place] placed in 11/21.   He returns for followup of CHF and CAD. Weight is down 1 lb.  Rarely gets lightheaded if he stands too fast.  Short of breath walking up stairs and hills.  No chest pain . No orthopnea/PND.  He has not  done cardiac rehab yet but is interested. Still smoking < 1 ppd. He is an Chief Strategy Officer, working on a novel.   ECG: NSR, old inferior MI, old anterior MI  Labs (9/21): K 4.3, creatinine 0.93 Labs (11/21): LDL 67, HDL 37, K 4.4, creatinine 0.81  [employer] Device interrogation: Heartlogic score 0  PMH: 1. GERD 2. Depression  3. CAD: Anterior STEMI in 8/21 with totally occluded LAD/no collaterals, 40% mid RCA.  Due to late presentation, no PCI was done.  - Had post-infarct pericarditis.  4. Chronic systolic CHF: Ischemic cardiomyopathy. [place].  - Echo (9/21) with EF 25-30%, normal RV, moderate AS with mean gradient 22 mmHg.  5. LV thrombus: Apical thrombus noted on echo at time of MI in 8/21. Resolved by 9/21 echo.  6. Aortic stenosis: Moderate on 9/21 echo.  7. Active smoker.   Current Outpatient Medications  Medication Sig Dispense Refill  . acetaminophen (TYLENOL) 500 MG tablet Take 500 mg by mouth every 6 (six) hours as needed for headache.    Marland Kitchen aspirin 81 MG chewable tablet Chew 1 tablet (81 mg total) by mouth daily. 30 tablet 11  . atorvastatin (LIPITOR) 80 MG tablet Take 1 tablet (80 mg total) by mouth daily. 30 tablet 5  . dapagliflozin propanediol (FARXIGA) 10 MG TABS tablet Take 1 tablet (10 mg total) by mouth daily before breakfast. 30 tablet 11  . digoxin (LANOXIN) 0.125 MG tablet Take 1 tablet (0.125 mg total) by mouth daily. 30 tablet 5  . furosemide (LASIX) 20 MG tablet Take  1 tablet (20 mg total) by mouth as needed for fluid or edema (take if you gain more than 5 lb in a week). 30 tablet 11  . mirtazapine (REMERON) 45 MG tablet Take 1 tablet (45 mg total) by mouth at bedtime. 30 tablet 2  . MITIGARE 0.6 MG CAPS Take 0.6 mg by mouth daily. 30 capsule 2  . Multiple Vitamin (MULTIVITAMIN WITH MINERALS) TABS tablet Take 1 tablet by mouth daily.    Marland Kitchen omega-3 acid ethyl esters (LOVAZA) 1 g capsule TAKE 2 CAPSULES (2 G TOTAL) BY MOUTH TWO TIMES DAILY. 60 capsule  4  . omeprazole (PRILOSEC OTC) 20 MG tablet Take 1 tablet (20 mg total) by mouth 2 (two) times daily. (Patient taking differently: Take 40 mg by mouth daily as needed (Heartburn).)    . sacubitril-valsartan (ENTRESTO) 24-26 MG Take 1 tablet by mouth 2 (two) times daily. 60 tablet 3  . sertraline (ZOLOFT) 100 MG tablet Take 1.5 tablets (150 mg total) by mouth daily. 45 tablet 2  . SILDENAFIL CITRATE PO Take 1-2 tablets by mouth daily as needed (ED). 30 mg tablet - take 1 - 2 tablets 30 to 45 min before sexual activity as needed    . spironolactone (ALDACTONE) 25 MG tablet Take 1 tablet (25 mg total) by mouth daily. 30 tablet 11  . traZODone (DESYREL) 50 MG tablet Take 1 tablet (50 mg total) by mouth at bedtime as needed for sleep. 30 tablet 2  . warfarin (COUMADIN) 5 MG tablet Take 1 and 1/2 tablets to 2 tablets by mouth daily as directed by the coumadin clinic. 55 tablet 2  . carvedilol (COREG) 6.25 MG tablet Take 1 tablet (6.25 mg total) by mouth 2 (two) times daily with a meal. 60 tablet 3   No current facility-administered medications for this encounter.    No Known Allergies    Social History   Socioeconomic History  . Marital status: Married    Spouse name: Not on file  . Number of children: Not on file  . Years of education: Not on file  . Highest education level: Not on file  Occupational History  . Not on file  Tobacco Use  . Smoking status: Current Every Day Smoker    Packs/day: 0.50    Types: Cigarettes    Last attempt to quit: 09/05/2019    Years since quitting: 0.4  . Smokeless tobacco: Never Used  . Tobacco comment: x15 yrs as of 2021  Vaping Use  . Vaping Use: Never used  Substance and Sexual Activity  . Alcohol use: Not Currently    Comment: previous use but has not drank in months  . Drug use: Never  . Sexual activity: Yes    Birth control/protection: None  Other Topics Concern  . Not on file  Social History Narrative  . Not on file   Social Determinants  of Health   Financial Resource Strain: Not on file  Food Insecurity: Not on file  Transportation Needs: Not on file  Physical Activity: Not on file  Stress: Not on file  Social Connections: Not on file  Intimate Partner Violence: Not on file      Family History  Problem Relation Age of Onset  . Arrhythmia Father     Vitals:   01/30/20 0913  BP: 122/80  Pulse: 86  SpO2: 97%  Weight: 81.1 kg (178 lb 12.8 oz)   Wt Readings from Last 3 Encounters:  01/30/20 81.1 kg (178 lb 12.8 oz)  12/27/19 82 kg (180 lb 12.8 oz)  12/05/19 80.6 kg (177 lb 12.8 oz)    PHYSICAL EXAM: General: NAD Neck: No JVD, no thyromegaly or thyroid nodule.  Lungs: Clear to auscultation bilaterally with normal respiratory effort. CV: Nondisplaced PMI.  Heart regular S1/S2, no S3/S4, no murmur.  No peripheral edema.  No carotid bruit.  Normal pedal pulses.  Abdomen: Soft, nontender, no hepatosplenomegaly, no distention.  Skin: Intact without lesions or rashes.  Neurologic: Alert and oriented x 3.  Psych: Normal affect. Extremities: No clubbing or cyanosis.  HEENT: Normal.   ASSESSMENT & PLAN: 1. CAD: Admission 8/21 for acute anterior MI, delayed presentation with no ischemic pain, had pleuritic-type chest pain (post-MI pericarditis). LHC with totally occluded mid LAD w/o collaterals. Suspected completed MI, no intervention. Plan medical management. No ischemic chest pain.  - Continue ASA 81 mg daily  - Continue atorvastatin 80 mg daily, good lipids in 11/21.  - Recommended that he do cardiac rehab, he has number and says her will call.  2. Chronic systolic CHF: Ischemic Cardiomyopathy.  Reduced EF post anterior MI.  Echo in 9/21 with EF 25-30%, peri-apical akinesis, moderate AS. Cath w/ occluded mLAD but no intervention as he had completed his infarct. He has [employer] ICD.  Currently NYHA class II, not volume overloaded on exam or by Heartlogic.  - Continue Entresto 24/26 bid.  BMET today.  -  Continue  spironolactone 25 mg daily. - Increase Coreg to 6.25 mg bid.  - Continue dapagliflozin 10 mg daily.  - Continue digoxin 0.125 daily. Check level today.  - PRN Lasix 20 mgfor wt gain > 3 lb in 24 hr or > 5 lb in 1 week  3. Post MI Pericarditis: Pleuritic CP resolved.  -He has been on colchicine > 3 months, can stop it.  4. LV thrombus: Large LV thrombus noted on echo in 8/21, resolved on 9/21 echo.   - Continue warfarin.  5. H/o Tobacco Abuse: Still smoking, needs to quit.  6. HLD: Continue atorvastatin, good lipids 11/21.   Followup with HF pharmacist in 3 wks for 2-3 visits for med titration, then see me in 2 months.   Marca Ancona, MD  01/30/2020

## 2020-01-30 NOTE — Patient Instructions (Addendum)
INCREASE Carvedilol 6.25mg  (1 tablet) twice daily  Labs done today, your results will be available in MyChart, we will contact you for abnormal readings.  Make sure you follow up with Cardiac Rehab  Your physician recommends that you schedule a follow-up appointment in: 3 weeks with pharmacy  Your physician recommends that you schedule a follow-up appointment in: 6 weeks  If you have any questions or concerns before your next appointment please send [place] a message through [facility] or call our office at [phone].    TO LEAVE A MESSAGE FOR THE NURSE SELECT OPTION 2, PLEASE LEAVE A MESSAGE INCLUDING: . YOUR NAME . DATE OF BIRTH . CALL BACK NUMBER . REASON FOR CALL**this is important as we prioritize the call backs  YOU WILL RECEIVE A CALL BACK THE SAME DAY AS LONG AS YOU CALL BEFORE 4:00 PM

## 2020-01-31 MED FILL — FARXIGA 10 MG TABLET: 10 | 30 days supply | Qty: 30 | Fill #1

## 2020-01-31 MED FILL — DIGITEK 125 MCG TABLET: 125 | 30 days supply | Qty: 30 | Fill #4

## 2020-01-31 MED FILL — hydrOXYzine HCL 10 MG TABS: 10 | 30 days supply | Qty: 90 | Fill #1

## 2020-01-31 MED FILL — ATORVASTATIN CALCIUM 80 MG: 80 | 30 days supply | Qty: 30 | Fill #4

## 2020-02-09 MED FILL — WARFARIN SODIUM 5 MG TABLET: 5 | 27 days supply | Qty: 55 | Fill #1

## 2020-02-15 ENCOUNTER — Ambulatory Visit (INDEPENDENT_AMBULATORY_CARE_PROVIDER_SITE_OTHER): Payer: Medicaid Other | Admitting: *Deleted

## 2020-02-15 ENCOUNTER — Other Ambulatory Visit: Payer: Self-pay

## 2020-02-15 DIAGNOSIS — I513 Intracardiac thrombosis, not elsewhere classified: Secondary | ICD-10-CM

## 2020-02-15 DIAGNOSIS — Z7901 Long term (current) use of anticoagulants: Secondary | ICD-10-CM | POA: Diagnosis not present

## 2020-02-15 DIAGNOSIS — Z5181 Encounter for therapeutic drug level monitoring: Secondary | ICD-10-CM | POA: Diagnosis not present

## 2020-02-15 LAB — POCT INR: INR: 2.4 (ref 2.0–3.0)

## 2020-02-15 NOTE — Patient Instructions (Signed)
Description   Continue taking Warfarin 1.5 tablets daily except for 2 tablets on Sunday, Tuesday and Thursday. Be consistent with your 2 servings of greens. Recheck INR in 6 weeks. Call coumadin clinic for any changes in medications or upcoming procedures. [phone].   BILL [id number]     

## 2020-02-18 NOTE — Progress Notes (Incomplete)
Referring Physician: HF Cardiology: Dr. Aundra Dubin  HPI:  55 y.o.male smoker wasadmitted on 8/1/21for delayed presentation anterior STEMI. Had had >36 hr of chest pain prior to seeking medical attention. Pain progressed to more pleuritic like CP. Found to have anterior ST elevations w/ precordial Q waves on admit. Hs troponin >27,000. Urgent cardiac cath showed totally occluded mid LAD w/o collaterals and 40% mid RCA stenosis. It was suspected he had completed his infarct and residual pleuritic CP was post-MI pericarditis. No intervention was performed. 2D echo demonstrated moderately reduced LVEF, 45-50%, + apical thrombus and moderate AS. MV normal. RV systolic function mildly reduced.   He was placed on medical management for CAD and systolic HF. He was started on aspirin, atorvastatin, Losartan and carvedilol. Warfarin started for apical thrombusw/ heparin bridge.   Post cath, there were initial concerns for developing cardiogenic shock but he remained stable and did not require pressor/inotropic support. Co-ox remained stable. Repeat echo showed further reduction of LVEF down to 30-35% but no post MI mechanical complications. RV mildly reduced. He did require IV furosemide for pulmonary edema and volume status and dyspnea improved w/ diuresis. He was continued on GDMT w/ losartan, spironolactone and carvedilol. BP too soft for Entresto. He was treated w/ colchicine for post MI pericarditis w/ improvement in pleuritic chest pain. Given anterior MI w/ EF <35%, he was fitted w/ a LifeVest prior to discharge w/ plans to get repeat echo in 1 month.  He had repeat echo in 9/21, showing EF 25-30%. RV normal.  He returned to HF Clinic for followup of CHF and CAD with Dr. Aundra Dubin on 11/17/19. He seemed to be doing generally well. Occasional sensation of fullness in his chest but no pain. Using furosemide prn, but still takes 4-5 times/week.Was still smoking <1 ppd. He is an Chief Strategy Officer, working on a  novel. At this visit, losartan was discontinued and Entresto 24/26 mg BID initiated.   On 11/28/19, ICD was placed by Dr. Caryl Comes.   He returned to HF clinic on 12/05/19 for pharmacist medication titration. Overall, he was feeling well. Had occasional dizziness which was not bothersome and not worse since changing to [facility]. Could walk for 40-60 min before getting SOB, notably more SOB going up stairs. Took furosemide PRN approximately 3-4 times per week. At this visit, spironolactone was increased to 25 mg daily.   He returned to HF clinic on 12/27/19 for pharmacist medication titration. He was overall feeling good. Had some lightheadedness occasionally but this was stable and not bothersome. Weight was up 3 lbs since last visit. Took furosemide PRN approximately 2-3 times per week when his weight is up and when his lungs feel "more full." He had not been adhering to low-salt diet as much. At this visit, Farxiga 10 mg daily was started.  He returned to HF clinic on 01/30/20 for follow-up with Dr. Aundra Dubin. He rarely got lightheaded when standing up too fast, and weight was down 1 lb. Reported SOB walking up stairs and hills. No chest pain, orthopnea/PND.  Today he returns to HF clinic for pharmacist medication titration. At last visit with MD, carvedilol was increased to 6.25 mg BID and colchicine was stopped.   122/80, 86, 178 lbs Plan A: if weight still down and euvolemic > incr carvedilol 12.5 vs 9.375 Plan B: Entresto 49/51 if BP ok again and not dizzy (was rare last time - BP consistently low 80-100s) 1 mo w/ Pharmacy if able to titrate (DM wants 2-3 visits)  Overall feeling ***.  Dizziness, lightheadedness, fatigue:  Chest pain or palpitations:  How is your breathing?: *** SOB: Able to complete all ADLs. Activity level ***  Weight at home pounds. Takes furosemide/torsemide/bumex *** mg *** daily.  LEE PND/Orthopnea  Appetite *** Low-salt diet:   Physical Exam Cost/affordability of  meds    HF Medications: Carvedilol 6.25 mg BID Entresto 24/26 mg BID Spironolactone 25 mg daily Digoxin 0.125 mg daily Furosemide 20 mg PRN  Has the patient been experiencing any side effects to the medications prescribed?  no  Does the patient have any problems obtaining medications due to transportation or finances?   No - [place] Medicaid  Understanding of regimen: good Understanding of indications: good Potential of compliance: good Patient understands to avoid NSAIDs. Patient understands to avoid decongestants.    Pertinent Lab Values (01/30/20): Marland Kitchen Serum creatinine 1.1, BUN 16, Potassium 4.0, Sodium 136, Digoxin 0.5  Vital Signs: . Weight: 180.8 lbs (last clinic weight: 177.8 lbs) . Blood pressure: 108/78  . Heart rate: 81   Assessment: 1. NLG:XQJJHERDE0/81 for acute anterior MI, delayed presentation with no ischemic pain, had pleuritic-type chest pain (post-MI pericarditis). LHC with totally occluded mid LAD w/o collaterals. Suspected completed MI, no intervention. Plan medical management.No ischemic chest pain. - Continue ASA 81 mg daily. Not on clopidogrel because on dual warfarin/aspirin.  - Continue atorvastatin 80 mg daily -Previously referred to cardiac rehab.  2.Chronic systolic KGY:JEHUDJSH Cardiomyopathy. Reduced EF post anterior MI. Echo in 9/21with EF 25-30%, peri-apical akinesis, moderate AS. Cath w/ occluded mLAD but no intervention as he had completed his infarct.He has [employer] ICD. - Currently NYHA class II, not volume overloaded on exam.  - Received [employer] ICD on 11/28/19 - Continue furosemide 20 mg PRN - ***Continue carvedilol 6.25 mg BID. - ***Continue Entresto 24/26 mg BID.   - Continuespironolactone 25 mg daily - Continue Farxiga 10 mg daily - Continue digoxin 0.125 mg daily.Last level 0.5 ng/mL on 01/30/20.  3. Post MI Pericarditis: Pleuritic CP resolved. Completed 3 months of colchicine.  4. LV thrombus: Large  LV6  thrombus noted on echoin 8/21, resolved on 9/21 echo. -Continue warfarin.  5. H/o Tobacco Abuse:Still smoking, needs to quit.  6. FWY:OVZCHYIF atorvastatin    Plan: 1) Medication changes: Based on clinical presentation, vital signs and recent labs will *** 2) Follow-up:   Richardine Service, PharmD, BCPS PGY2 Cardiology Pharmacy Resident  Audry Riles, PharmD, BCPS, [facility], CPP Heart Failure Clinic Pharmacist [phone]

## 2020-02-20 ENCOUNTER — Inpatient Hospital Stay (HOSPITAL_COMMUNITY): Admission: RE | Admit: 2020-02-20 | Payer: Medicaid Other | Source: Ambulatory Visit

## 2020-02-21 MED FILL — ENTRESTO 24 MG-26 MG TABLET: 24-26 | 30 days supply | Qty: 60 | Fill #3

## 2020-02-21 MED FILL — SPIRONOLACTONE 25 MG TABLET: 25 | 30 days supply | Qty: 30 | Fill #2

## 2020-02-23 ENCOUNTER — Other Ambulatory Visit: Payer: Self-pay

## 2020-02-23 ENCOUNTER — Telehealth (INDEPENDENT_AMBULATORY_CARE_PROVIDER_SITE_OTHER): Payer: Medicaid Other | Admitting: Psychiatry

## 2020-02-23 ENCOUNTER — Encounter (HOSPITAL_COMMUNITY): Payer: Self-pay | Admitting: Psychiatry

## 2020-02-23 ENCOUNTER — Other Ambulatory Visit (HOSPITAL_COMMUNITY): Payer: Self-pay | Admitting: Psychiatry

## 2020-02-23 DIAGNOSIS — F331 Major depressive disorder, recurrent, moderate: Secondary | ICD-10-CM

## 2020-02-23 DIAGNOSIS — F411 Generalized anxiety disorder: Secondary | ICD-10-CM

## 2020-02-23 MED ORDER — TRAZODONE HCL 50 MG PO TABS: 50.0000 mg | ORAL_TABLET | Freq: Every evening | ORAL | 2 refills | Status: DC | PRN

## 2020-02-23 MED ORDER — HYDROXYZINE HCL 10 MG PO TABS: 10.0000 mg | ORAL_TABLET | Freq: Three times a day (TID) | ORAL | 2 refills | Status: DC | PRN

## 2020-02-23 MED ORDER — MIRTAZAPINE 45 MG PO TABS
45.0000 mg | ORAL_TABLET | Freq: Every day | ORAL | 2 refills | Status: DC
Start: 2020-02-23 — End: 2020-02-23

## 2020-02-23 MED ORDER — SERTRALINE HCL 100 MG PO TABS: 150.0000 mg | ORAL_TABLET | Freq: Every day | ORAL | 2 refills | Status: DC

## 2020-02-23 MED FILL — hydrOXYzine HCL 10 MG TABS: 10 | 30 days supply | Qty: 90 | Fill #0

## 2020-02-23 MED FILL — traZODone HCL 50 MG TABS: 50 | 30 days supply | Qty: 30 | Fill #0

## 2020-02-23 MED FILL — MIRTAZAPINE 45 MG TABLET: 45 | 30 days supply | Qty: 30 | Fill #0

## 2020-02-23 MED FILL — SERTRALINE HCL 100 MG TAB: 100 | 90 days supply | Qty: 45 | Fill #0

## 2020-02-23 NOTE — Progress Notes (Signed)
BH MD/PA/NP OP Progress Note Virtual Visit via Video Note  I connected with Craig Flores on 02/23/20 at 11:00 AM EST by a video enabled telemedicine application and verified that I am speaking with the correct person using two identifiers.  Location: Patient: Home Provider: Clinic   I discussed the limitations of evaluation and management by telemedicine and the availability of in person appointments. The patient expressed understanding and agreed to proceed.  I provided 30 minutes of non-face-to-face time during this encounter.    02/23/2020 11:33 AM Craig Flores  MRN:  [id number]  Chief Complaint: "Things are going well and I'm sleeping a lot better"  HPI:  55 year old male seen today for follow up psychiatric evaluation. He has a psychiatric history of alcohol dependence, substance induced mood disorder, anxiety, depression, and SI. He is currently being managed on Zoloft 150mg  daily, Trazodone 50 mg nightly as needed, hydroxyzine 10 mg three times daily as needed, and Mirtazapine 45 mg at nightly. He notes that his medications are effective in managing his psychiatric conditions,=.  Today he is well groomed, pleasant, cooperative, engaged in conversation and maintained eye contact. He notes that since his last visit his anxiety, depression, and sleep have improved. Provider conducted a  PHQ-9 and patient scored 6, at [place] visit he scored a 76. Provider also conducted a  GAD-7 and patient scored 9, at last visit he scored a 18. He reports that he is sleeping 9 hours nightly and denies SI/HI/VAH or paranoia.  Patient notes that his wife has begun attending [place] meetings which he reports is helpful to him and her. He notes that he considered going to a family support group but notes that he hasn't as of yet. He also informed provider that his PCP has adjusted medications for his heart and notes that physically he is tired at times but overall feels well.  No medication changes  made today. Patient agreeable to continue all medications as prescribed. He will follow up with outpatient counseling for therapy. No other concerns noted at this time.   Visit Diagnosis:    ICD-10-CM   1. Generalized anxiety disorder  F41.1 mirtazapine (REMERON) 45 MG tablet    sertraline (ZOLOFT) 100 MG tablet  2. Moderate episode of recurrent major depressive disorder (HCC)  F33.1 mirtazapine (REMERON) 45 MG tablet    traZODone (DESYREL) 50 MG tablet    sertraline (ZOLOFT) 100 MG tablet    Past Psychiatric History: alcohol dependence, substance induced mood disorder, anxiety, depression, and SI.   Past Medical History:  Past Medical History:  Diagnosis Date  . Acid reflux   . CHF (congestive heart failure) ([place])   . Depression   . Migraines   . Myocardial infarction [facility])     Past Surgical History:  Procedure Laterality Date  . CORONARY/GRAFT ACUTE MI REVASCULARIZATION N/A 08/28/2019   Procedure: Coronary/Graft Acute MI Revascularization;  Surgeon: [name], MD;  Location: [place] [facility];  Service: Cardiovascular;  Laterality: N/A;  . ICD IMPLANT N/A 11/28/2019   Procedure: ICD IMPLANT;  Surgeon: Deboraha Sprang, MD;  Location: [place] [facility];  Service: Cardiovascular;  Laterality: N/A;  . LEFT HEART CATH AND CORONARY ANGIOGRAPHY N/A 08/28/2019   Procedure: LEFT HEART CATH AND CORONARY ANGIOGRAPHY;  Surgeon: [name], MD;  Location: [place] [facility];  Service: Cardiovascular;  Laterality: N/A;    Family Psychiatric History: Notes that his mother and aunt had mental health issues. Notes he belives they had  anxiety  Family History:  Family History  Problem Relation Age of Onset  . Arrhythmia Father     Social History:  Social History   Socioeconomic History  . Marital status: Married    Spouse name: Not on file  . Number of children: Not on file  . Years of education: Not on file  . Highest education level: Not on file  Occupational History  .  Not on file  Tobacco Use  . Smoking status: Current Every Day Smoker    Packs/day: 0.50    Types: Cigarettes    Last attempt to quit: 09/05/2019    Years since quitting: 0.4  . Smokeless tobacco: Never Used  . Tobacco comment: x15 yrs as of 2021  Vaping Use  . Vaping Use: Never used  Substance and Sexual Activity  . Alcohol use: Not Currently    Comment: previous use but has not drank in months  . Drug use: Never  . Sexual activity: Yes    Birth control/protection: None  Other Topics Concern  . Not on file  Social History Narrative  . Not on file   Social Determinants of Health   Financial Resource Strain: Not on file  Food Insecurity: Not on file  Transportation Needs: Not on file  Physical Activity: Not on file  Stress: Not on file  Social Connections: Not on file    Allergies: No Known Allergies  Metabolic Disorder Labs: Lab Results  Component Value Date   HGBA1C 5.8 (H) 08/28/2019   MPG 119.76 08/28/2019   No results found for: PROLACTIN Lab Results  Component Value Date   CHOL 143 12/13/2019   TRIG 196 (H) 12/13/2019   HDL 37 (L) 12/13/2019   CHOLHDL 3.9 12/13/2019   VLDL 39 12/13/2019   LDLCALC 67 12/13/2019   Ebony 64 11/17/2019   Lab Results  Component Value Date   TSH 3.181 08/28/2019    Therapeutic Level Labs: No results found for: LITHIUM No results found for: VALPROATE No components found for:  CBMZ  Current Medications: Current Outpatient Medications  Medication Sig Dispense Refill  . hydrOXYzine (ATARAX/VISTARIL) 10 MG tablet Take 1 tablet (10 mg total) by mouth 3 (three) times daily as needed. 90 tablet 2  . acetaminophen (TYLENOL) 500 MG tablet Take 500 mg by mouth every 6 (six) hours as needed for headache.    Marland Kitchen aspirin 81 MG chewable tablet Chew 1 tablet (81 mg total) by mouth daily. 30 tablet 11  . atorvastatin (LIPITOR) 80 MG tablet Take 1 tablet (80 mg total) by mouth daily. 30 tablet 5  . carvedilol (COREG) 6.25 MG tablet Take  1 tablet (6.25 mg total) by mouth 2 (two) times daily with a meal. 60 tablet 3  . dapagliflozin propanediol (FARXIGA) 10 MG TABS tablet Take 1 tablet (10 mg total) by mouth daily before breakfast. 30 tablet 11  . digoxin (LANOXIN) 0.125 MG tablet Take 1 tablet (0.125 mg total) by mouth daily. 30 tablet 5  . furosemide (LASIX) 20 MG tablet Take 1 tablet (20 mg total) by mouth as needed for fluid or edema (take if you gain more than 5 lb in a week). 30 tablet 11  . mirtazapine (REMERON) 45 MG tablet Take 1 tablet (45 mg total) by mouth at bedtime. 30 tablet 2  . MITIGARE 0.6 MG CAPS Take 0.6 mg by mouth daily. 30 capsule 2  . Multiple Vitamin (MULTIVITAMIN WITH MINERALS) TABS tablet Take 1 tablet by mouth daily.    Marland Kitchen  omega-3 acid ethyl esters (LOVAZA) 1 g capsule TAKE 2 CAPSULES (2 G TOTAL) BY MOUTH TWO TIMES DAILY. 60 capsule 4  . omeprazole (PRILOSEC OTC) 20 MG tablet Take 1 tablet (20 mg total) by mouth 2 (two) times daily. (Patient taking differently: Take 40 mg by mouth daily as needed (Heartburn).)    . sacubitril-valsartan (ENTRESTO) 24-26 MG Take 1 tablet by mouth 2 (two) times daily. 60 tablet 3  . sertraline (ZOLOFT) 100 MG tablet Take 1.5 tablets (150 mg total) by mouth daily. 45 tablet 2  . SILDENAFIL CITRATE PO Take 1-2 tablets by mouth daily as needed (ED). 30 mg tablet - take 1 - 2 tablets 30 to 45 min before sexual activity as needed    . spironolactone (ALDACTONE) 25 MG tablet Take 1 tablet (25 mg total) by mouth daily. 30 tablet 11  . traZODone (DESYREL) 50 MG tablet Take 1 tablet (50 mg total) by mouth at bedtime as needed for sleep. 30 tablet 2  . warfarin (COUMADIN) 5 MG tablet Take 1 and 1/2 tablets to 2 tablets by mouth daily as directed by the coumadin clinic. 55 tablet 2   No current facility-administered medications for this visit.     Musculoskeletal: Strength & Muscle Tone: Unable to assess due to telehealth visits Gait & Station: Unable to assess due to telehealth  visits Patient leans: N/A  Psychiatric Specialty Exam: Review of Systems  There were no vitals taken for this visit.There is no height or weight on file to calculate BMI.  General Appearance: Well Groomed  Eye Contact:  Good  Speech:  Clear and Coherent and Normal Rate  Volume:  Normal  Mood:  Euthymic  Affect:  Appropriate and Congruent  Thought Process:  Coherent, Goal Directed and Linear  Orientation:  Full (Time, Place, and Person)  Thought Content: WDL and Logical   Suicidal Thoughts:  No  Homicidal Thoughts:  No  Memory:  Immediate;   Good Recent;   Good Remote;   Good  Judgement:  Good  Insight:  Good  Psychomotor Activity:  Normal  Concentration:  Concentration: Good and Attention Span: Good  Recall:  Good  Fund of Knowledge: Good  Language: Good  Akathisia:  No  Handed:  Right  AIMS (if indicated): Not done  Assets:  Communication Skills Desire for Improvement Financial Resources/Insurance Housing Intimacy Social Support  ADL's:  Intact  Cognition: WNL  Sleep:  Good   Screenings: AIMS   Flowsheet Row Admission (Discharged) from 04/26/2014 in [place] [facility]  AIMS Total Score 0    AUDIT   Flowsheet Row Admission (Discharged) from 04/26/2014 in [place] [facility]  Alcohol Use Disorder Identification Test Final Score (AUDIT) 19    GAD-7   Flowsheet Row Video Visit from 02/23/2020 in [facility] Video Visit from 11/23/2019 in [facility] Counselor from 11/04/2019 in [facility]  Total GAD-7 Score 9 18 13     PHQ2-9   Flowsheet Row Video Visit from 02/23/2020 in [facility] Video Visit from 11/23/2019 in [facility] Counselor from 11/04/2019 in [facility]  PHQ-2 Total Score 2 5 6   PHQ-9 Total Score 6 15 16        Assessment and  Plan: Patient notes that his anxiety, depression, and sleep have improved since his last visit. No medication changes made today. Patient agreeable to continue all medications  as prescribed.  1. Generalized anxiety disorder  Continue- mirtazapine (REMERON) 45 MG tablet; Take 1 tablet (45 mg total) by mouth at bedtime.  Dispense: 30 tablet; Refill: 2 Continue- sertraline (ZOLOFT) 100 MG tablet; Take 1.5 tablets (150 mg total) by mouth daily.  Dispense: 45 tablet; Refill: 2  2. Moderate episode of recurrent major depressive disorder (HCC)  Continue- mirtazapine (REMERON) 45 MG tablet; Take 1 tablet (45 mg total) by mouth at bedtime.  Dispense: 30 tablet; Refill: 2 Continue- traZODone (DESYREL) 50 MG tablet; Take 1 tablet (50 mg total) by mouth at bedtime as needed for sleep.  Dispense: 30 tablet; Refill: 2 Continue- sertraline (ZOLOFT) 100 MG tablet; Take 1.5 tablets (150 mg total) by mouth daily.  Dispense: 45 tablet; Refill: 2  Follow up in 3 months Follow up with therapy  Salley Slaughter, NP 02/23/2020, 11:33 AM

## 2020-02-26 DIAGNOSIS — Z95810 Presence of automatic (implantable) cardiac defibrillator: Secondary | ICD-10-CM | POA: Insufficient documentation

## 2020-02-28 ENCOUNTER — Ambulatory Visit (INDEPENDENT_AMBULATORY_CARE_PROVIDER_SITE_OTHER): Payer: Medicaid Other

## 2020-02-28 DIAGNOSIS — I255 Ischemic cardiomyopathy: Secondary | ICD-10-CM | POA: Diagnosis not present

## 2020-02-28 LAB — CUP PACEART REMOTE DEVICE CHECK
Battery Remaining Longevity: 180 mo
Battery Remaining Percentage: 100 %
Brady Statistic RV Percent Paced: 0 %
Date Time Interrogation Session: 20220201010000
HighPow Impedance: 84 Ohm
Implantable Lead Implant Date: 20211101
Implantable Lead Location: 753860
Implantable Lead Model: 138
Implantable Lead Serial Number: 303500
Implantable Pulse Generator Implant Date: 20211101
Lead Channel Impedance Value: 562 Ohm
Lead Channel Setting Pacing Amplitude: 3.5 V
Lead Channel Setting Pacing Pulse Width: 0.4 ms
Lead Channel Setting Sensing Sensitivity: 0.5 mV
Pulse Gen Serial Number: 209902

## 2020-03-02 ENCOUNTER — Encounter: Payer: Self-pay | Admitting: Internal Medicine

## 2020-03-02 DIAGNOSIS — Z95810 Presence of automatic (implantable) cardiac defibrillator: Secondary | ICD-10-CM

## 2020-03-02 DIAGNOSIS — I5021 Acute systolic (congestive) heart failure: Secondary | ICD-10-CM

## 2020-03-02 DIAGNOSIS — I255 Ischemic cardiomyopathy: Secondary | ICD-10-CM

## 2020-03-06 ENCOUNTER — Other Ambulatory Visit: Payer: Self-pay | Admitting: Cardiology

## 2020-03-06 ENCOUNTER — Other Ambulatory Visit: Payer: Self-pay | Admitting: Internal Medicine

## 2020-03-06 MED FILL — ATORVASTATIN CALCIUM 80 MG: 80 | 30 days supply | Qty: 30 | Fill #0

## 2020-03-06 MED FILL — CARVEDILOL 6.25 MG TABLET: 6.25 | 30 days supply | Qty: 60 | Fill #1

## 2020-03-06 MED FILL — FARXIGA 10 MG TABLET: 10 | 30 days supply | Qty: 30 | Fill #2

## 2020-03-07 ENCOUNTER — Other Ambulatory Visit: Payer: Self-pay | Admitting: Cardiology

## 2020-03-08 NOTE — Progress Notes (Signed)
Remote ICD transmission.   

## 2020-03-13 ENCOUNTER — Other Ambulatory Visit (HOSPITAL_COMMUNITY): Payer: Self-pay | Admitting: Cardiology

## 2020-03-13 ENCOUNTER — Other Ambulatory Visit: Payer: Self-pay

## 2020-03-13 ENCOUNTER — Encounter (HOSPITAL_COMMUNITY): Payer: Self-pay | Admitting: Cardiology

## 2020-03-13 ENCOUNTER — Ambulatory Visit (HOSPITAL_COMMUNITY)
Admission: RE | Admit: 2020-03-13 | Discharge: 2020-03-13 | Disposition: A | Discharge status: Home / Self Care | Payer: Medicaid Other | Source: Ambulatory Visit | Attending: Cardiology | Admitting: Cardiology

## 2020-03-13 VITALS — BP 110/70 | HR 83 | Wt 182.8 lb

## 2020-03-13 DIAGNOSIS — Z7901 Long term (current) use of anticoagulants: Secondary | ICD-10-CM | POA: Diagnosis not present

## 2020-03-13 DIAGNOSIS — I5022 Chronic systolic (congestive) heart failure: Secondary | ICD-10-CM | POA: Insufficient documentation

## 2020-03-13 DIAGNOSIS — I252 Old myocardial infarction: Secondary | ICD-10-CM | POA: Diagnosis not present

## 2020-03-13 DIAGNOSIS — E785 Hyperlipidemia, unspecified: Secondary | ICD-10-CM | POA: Insufficient documentation

## 2020-03-13 DIAGNOSIS — Z95810 Presence of automatic (implantable) cardiac defibrillator: Secondary | ICD-10-CM | POA: Diagnosis not present

## 2020-03-13 DIAGNOSIS — I255 Ischemic cardiomyopathy: Secondary | ICD-10-CM | POA: Diagnosis not present

## 2020-03-13 DIAGNOSIS — I2510 Atherosclerotic heart disease of native coronary artery without angina pectoris: Secondary | ICD-10-CM | POA: Diagnosis not present

## 2020-03-13 DIAGNOSIS — Z79899 Other long term (current) drug therapy: Secondary | ICD-10-CM | POA: Diagnosis not present

## 2020-03-13 DIAGNOSIS — F17210 Nicotine dependence, cigarettes, uncomplicated: Secondary | ICD-10-CM | POA: Insufficient documentation

## 2020-03-13 LAB — BASIC METABOLIC PANEL
Anion gap: 12 (ref 5–15)
BUN: 10 mg/dL (ref 6–20)
CO2: 24 mmol/L (ref 22–32)
Calcium: 9.6 mg/dL (ref 8.9–10.3)
Chloride: 102 mmol/L (ref 98–111)
Creatinine, Ser: 1 mg/dL (ref 0.61–1.24)
GFR, Estimated: >60 mL/min (ref >60)
Glucose, Bld: 173 mg/dL — ABNORMAL HIGH (ref 70–99)
Potassium: 3.9 mmol/L (ref 3.5–5.1)
Sodium: 138 mmol/L (ref 135–145)

## 2020-03-13 MED ORDER — ENTRESTO 49-51 MG PO TABS: 1.0000 | ORAL_TABLET | Freq: Two times a day (BID) | ORAL | 3 refills | Status: DC

## 2020-03-13 MED ORDER — DIGOXIN 125 MCG PO TABS: 0.1250 mg | ORAL_TABLET | Freq: Every day | ORAL | 3 refills | Status: DC

## 2020-03-13 MED FILL — DIGOXIN 0.125 MG TABLET: 125 | 90 days supply | Qty: 90 | Fill #0

## 2020-03-13 MED FILL — ENTRESTO 49 MG-51 MG TABLET: 49-51 | 30 days supply | Qty: 60 | Fill #0

## 2020-03-13 NOTE — Patient Instructions (Signed)
Labs done today. We will contact you only if your labs are abnormal.  RESTART Digoxin 0.125mg  (1 tablet) by mouth daily.  STOP taking Asprin  INCREASE Entresto to 49-51mg  (1 tablet) by mouth 2 times daily.  No other medication changes were made. Please continue all current medications as prescribed.  Your physician recommends that you schedule a follow-up appointment in: 10 days for a lab only appointment, 3 weeks for an appointment with our Clinic Pharmacist, Lauren and in 3 months for an appointment with Dr. Aundra Dubin   If you have any questions or concerns before your next appointment please send [place] a message through [facility] or call our office at [phone].    TO LEAVE A MESSAGE FOR THE NURSE SELECT OPTION 2, PLEASE LEAVE A MESSAGE INCLUDING: . YOUR NAME . DATE OF BIRTH . CALL BACK NUMBER . REASON FOR CALL**this is important as we prioritize the call backs  YOU WILL RECEIVE A CALL BACK THE SAME DAY AS LONG AS YOU CALL BEFORE 4:00 PM   Do the following things EVERYDAY: 1) Weigh yourself in the morning before breakfast. Write it down and keep it in a log. 2) Take your medicines as prescribed 3) Eat low salt foods--Limit salt (sodium) to 2000 mg per day.  4) Stay as active as you can everyday 5) Limit all fluids for the day to less than 2 liters   At the [facility], you and your health needs are our priority. As part of our continuing mission to provide you with exceptional heart care, we have created designated Provider Care Teams. These Care Teams include your primary Cardiologist (physician) and Advanced Practice Providers (APPs- Physician Assistants and Nurse Practitioners) who all work together to provide you with the care you need, when you need it.   You may see any of the following providers on your designated Care Team at your next follow up: Marland Kitchen Dr Glori Bickers . Dr Loralie Champagne . Darrick Grinder, NP . Lyda Jester, PA . Audry Riles,  PharmD   Please be sure to bring in all your medications bottles to every appointment.

## 2020-03-13 NOTE — Progress Notes (Signed)
Advanced Heart Failure Clinic Note   Referring Physician: HF Cardiology: Dr. Aundra Dubin  Reason for Visit: Heart Failure    HPI: 55 y.o. male smoker was admitted on 08/28/19 for delayed presentation anterior STEMI. Had had >36 hr of chest pain prior to seeking medical attention. Pain progressed to more pleuritic like CP. Found to have anterior ST elevations w/ precordial Q waves on admit. Hs troponin >27,000. Urgent cardiac cath showed totally occluded mid LAD w/o collaterals and 40% mid RCA stenosis. It was suspected he had completed his infarct and residual pleuritic CP was post-MI pericarditis. No intervention was performed. 2D echo demonstrated moderately reduced LVEF, 45-50%, + apical thrombus and moderate AS. MV normal. RV systolic function mildly reduced.   He was placed on medical management for CAD and systolic HF. He was started on ASA, atorvastatin, Losartan and Coreg. Coumadin started for apical thrombus w/ heparin bridge.   Post cath, there were initial concerns for developing cardiogenic shock but he remained stable and did not require pressor/inotropic support. Co-ox remained stable. Repeat echo showed further reduction of LVEF down to 30-35% but no post MI mechanical complications. RV mildly reduced. He did require IV Lasix for pulmonary edema and volume status and dyspnea improved w/ diuresis. He was continued on GTDMT w/ losartan, spironolactone and Coreg. BP too soft for Entresto. He was treated w/ colchicine for post MI pericarditis w/ improvement in pleuritic chest pain. Given anterior MI w/ EF <35%, he was fitted w/ a LifeVest prior to discharge w/ plans to get repeat echo in 1 month.   He had repeat echo in 9/21, showing EF 25-30%.  He had [place] placed in 11/21.   He returns for followup of CHF and CAD. Weight is up 4 lbs.  He is using Lasix rarely.  He has been off digoxin for about a week, thinks he feels more energetic when taking digoxin.  Dyspnea walking up a  hill or walking fast up stairs.  No dyspnea walking on flat ground.  No chest pain.  He is still smoking but trying to quit.  No orthopnea/PND/palpitations.   Labs (9/21): K 4.3, creatinine 0.93 Labs (11/21): LDL 67, HDL 37, K 4.4, creatinine 0.81 Labs (1/22): digoxin 0.5, K 4, creatinine 1.1.   [employer] Device interrogation: Heartlogic score 0  PMH: 1. GERD 2. Depression  3. CAD: Anterior STEMI in 8/21 with totally occluded LAD/no collaterals, 40% mid RCA.  Due to late presentation, no PCI was done.  - Had post-infarct pericarditis.  4. Chronic systolic CHF: Ischemic cardiomyopathy. [place].  - Echo (9/21) with EF 25-30%, normal RV, moderate AS with mean gradient 22 mmHg.  5. LV thrombus: Apical thrombus noted on echo at time of MI in 8/21. Resolved by 9/21 echo.  6. Aortic stenosis: Moderate on 9/21 echo.  7. Active smoker.   Current Outpatient Medications  Medication Sig Dispense Refill  . acetaminophen (TYLENOL) 500 MG tablet Take 500 mg by mouth every 6 (six) hours as needed for headache.    Marland Kitchen atorvastatin (LIPITOR) 80 MG tablet TAKE 1 TABLET (80 MG TOTAL) BY MOUTH DAILY. 30 tablet 4  . carvedilol (COREG) 6.25 MG tablet Take 1 tablet (6.25 mg total) by mouth 2 (two) times daily with a meal. 60 tablet 3  . dapagliflozin propanediol (FARXIGA) 10 MG TABS tablet Take 1 tablet (10 mg total) by mouth daily before breakfast. 30 tablet 11  . digoxin (LANOXIN) 0.125 MG tablet Take 1 tablet (0.125 mg  total) by mouth daily. 90 tablet 3  . furosemide (LASIX) 20 MG tablet Take 1 tablet (20 mg total) by mouth as needed for fluid or edema (take if you gain more than 5 lb in a week). 30 tablet 11  . hydrOXYzine (ATARAX/VISTARIL) 10 MG tablet Take 1 tablet (10 mg total) by mouth 3 (three) times daily as needed. 90 tablet 2  . mirtazapine (REMERON) 45 MG tablet Take 1 tablet (45 mg total) by mouth at bedtime. 30 tablet 2  . MITIGARE 0.6 MG CAPS Take 0.6 mg by mouth daily. 30  capsule 2  . Multiple Vitamin (MULTIVITAMIN WITH MINERALS) TABS tablet Take 1 tablet by mouth daily.    Marland Kitchen omega-3 acid ethyl esters (LOVAZA) 1 g capsule TAKE 2 CAPSULES (2 G TOTAL) BY MOUTH TWO TIMES DAILY. 60 capsule 4  . omeprazole (PRILOSEC) 20 MG capsule Take 20 mg by mouth as needed.    . sacubitril-valsartan (ENTRESTO) 49-51 MG Take 1 tablet by mouth 2 (two) times daily. 180 tablet 3  . sertraline (ZOLOFT) 100 MG tablet Take 1.5 tablets (150 mg total) by mouth daily. 45 tablet 2  . SILDENAFIL CITRATE PO Take 1-2 tablets by mouth daily as needed (ED). 30 mg tablet - take 1 - 2 tablets 30 to 45 min before sexual activity as needed    . spironolactone (ALDACTONE) 25 MG tablet Take 1 tablet (25 mg total) by mouth daily. 30 tablet 11  . traZODone (DESYREL) 50 MG tablet Take 1 tablet (50 mg total) by mouth at bedtime as needed for sleep. 30 tablet 2  . warfarin (COUMADIN) 5 MG tablet Take 1 and 1/2 tablets to 2 tablets by mouth daily as directed by the coumadin clinic. 55 tablet 2   No current facility-administered medications for this encounter.    No Known Allergies    Social History   Socioeconomic History  . Marital status: Married    Spouse name: Not on file  . Number of children: Not on file  . Years of education: Not on file  . Highest education level: Not on file  Occupational History  . Not on file  Tobacco Use  . Smoking status: Current Every Day Smoker    Packs/day: 0.50    Types: Cigarettes    Last attempt to quit: 09/05/2019    Years since quitting: 0.5  . Smokeless tobacco: Never Used  . Tobacco comment: x15 yrs as of 2021  Vaping Use  . Vaping Use: Never used  Substance and Sexual Activity  . Alcohol use: Not Currently    Comment: previous use but has not drank in months  . Drug use: Never  . Sexual activity: Yes    Birth control/protection: None  Other Topics Concern  . Not on file  Social History Narrative  . Not on file   Social Determinants of Health    Financial Resource Strain: Not on file  Food Insecurity: Not on file  Transportation Needs: Not on file  Physical Activity: Not on file  Stress: Not on file  Social Connections: Not on file  Intimate Partner Violence: Not on file      Family History  Problem Relation Age of Onset  . Arrhythmia Father     Vitals:   03/13/20 1034  BP: 110/70  Pulse: 83  SpO2: 96%  Weight: 82.9 kg (182 lb 12.8 oz)   Wt Readings from Last 3 Encounters:  03/13/20 82.9 kg (182 lb 12.8 oz)  01/30/20 81.1 kg (  178 lb 12.8 oz)  12/27/19 82 kg (180 lb 12.8 oz)    PHYSICAL EXAM: General: NAD Neck: No JVD, no thyromegaly or thyroid nodule.  Lungs: Clear to auscultation bilaterally with normal respiratory effort. CV: Nondisplaced PMI.  Heart regular S1/S2, no S3/S4, no murmur.  No peripheral edema.  No carotid bruit.  Normal pedal pulses.  Abdomen: Soft, nontender, no hepatosplenomegaly, no distention.  Skin: Intact without lesions or rashes.  Neurologic: Alert and oriented x 3.  Psych: Normal affect. Extremities: No clubbing or cyanosis.  HEENT: Normal.   ASSESSMENT & PLAN: 1. CAD: Admission 8/21 for acute anterior MI, delayed presentation with no ischemic pain, had pleuritic-type chest pain (post-MI pericarditis). LHC with totally occluded mid LAD w/o collaterals. Suspected completed MI, no intervention. Plan medical management. No ischemic chest pain.  - He can stop ASA given warfarin use.  - Continue atorvastatin 80 mg daily, good lipids in 11/21.  - Increase activity with regular exercise.  2. Chronic systolic CHF: Ischemic Cardiomyopathy.  Reduced EF post anterior MI.  Echo in 9/21 with EF 25-30%, peri-apical akinesis, moderate AS. Cath w/ occluded mLAD but no intervention as he had completed his infarct. He has [place].  Currently NYHA class II, not volume overloaded on exam or by Heartlogic.  - Increase Entresto to 49/5 bid.  BMET today and 10 days.  - Continue  spironolactone 25 mg daily. - Continue Coreg 6.25 mg bid.  - Continue dapagliflozin 10 mg daily.  - Restart digoxin 0.125 daily, he is less fatigued when he takes it.  - PRN Lasix 20 mgfor wt gain > 3 lb in 24 hr or > 5 lb in 1 week  3. Post MI Pericarditis: Resolved.  4. LV thrombus: Large LV thrombus noted on echo in 8/21, resolved on 9/21 echo.   - Continue warfarin (can stop ASA).  5. H/o Tobacco Abuse: Still smoking, needs to quit.  - Recommended that he try nicotine patches.  6. HLD: Continue atorvastatin, good lipids 11/21.   Followup with HF pharmacist in 3 wks for med titration, then see me in 3 months.   Loralie Champagne, MD  03/13/2020

## 2020-03-16 MED FILL — WARFARIN SODIUM 5 MG TABLET: 5 | 27 days supply | Qty: 55 | Fill #2

## 2020-03-23 ENCOUNTER — Ambulatory Visit (HOSPITAL_COMMUNITY)
Admission: RE | Admit: 2020-03-23 | Discharge: 2020-03-23 | Disposition: A | Discharge status: Home / Self Care | Payer: Medicaid Other | Source: Ambulatory Visit | Attending: Cardiology | Admitting: Cardiology

## 2020-03-23 ENCOUNTER — Other Ambulatory Visit: Payer: Self-pay

## 2020-03-23 DIAGNOSIS — I5022 Chronic systolic (congestive) heart failure: Secondary | ICD-10-CM | POA: Diagnosis not present

## 2020-03-23 DIAGNOSIS — Z7901 Long term (current) use of anticoagulants: Secondary | ICD-10-CM | POA: Insufficient documentation

## 2020-03-23 DIAGNOSIS — Z5181 Encounter for therapeutic drug level monitoring: Secondary | ICD-10-CM | POA: Diagnosis not present

## 2020-03-23 DIAGNOSIS — I513 Intracardiac thrombosis, not elsewhere classified: Secondary | ICD-10-CM | POA: Insufficient documentation

## 2020-03-23 LAB — BASIC METABOLIC PANEL
Anion gap: 11 (ref 5–15)
BUN: 11 mg/dL (ref 6–20)
CO2: 24 mmol/L (ref 22–32)
Calcium: 9.2 mg/dL (ref 8.9–10.3)
Chloride: 103 mmol/L (ref 98–111)
Creatinine, Ser: 0.96 mg/dL (ref 0.61–1.24)
GFR, Estimated: >60 mL/min (ref >60)
Glucose, Bld: 102 mg/dL — ABNORMAL HIGH (ref 70–99)
Potassium: 3.9 mmol/L (ref 3.5–5.1)
Sodium: 138 mmol/L (ref 135–145)

## 2020-03-23 MED FILL — SPIRONOLACTONE 25 MG TABLET: 25 | 30 days supply | Qty: 30 | Fill #3

## 2020-03-23 MED FILL — MIRTAZAPINE 45 MG TABLET: 45 | 30 days supply | Qty: 30 | Fill #1

## 2020-03-23 MED FILL — SERTRALINE HCL 100 MG TAB: 100 | 30 days supply | Qty: 45 | Fill #1

## 2020-03-23 NOTE — Progress Notes (Signed)
Referring Physician: HF Cardiology: Dr. Aundra Dubin  HPI:  55 y.o.male smoker wasadmitted on 8/1/21for delayed presentation anterior STEMI. Had had >36 hr of chest pain prior to seeking medical attention. Pain progressed to more pleuritic like CP. Found to have anterior ST elevations w/ precordial Q waves on admit. Hs troponin >27,000. Urgent cardiac cath showed totally occluded mid LAD w/o collaterals and 40% mid RCA stenosis. It was suspected he had completed his infarct and residual pleuritic CP was post-MI pericarditis. No intervention was performed. 2D echo demonstrated moderately reduced LVEF, 45-50%, + apical thrombus and moderate AS. MV normal. RV systolic function mildly reduced.   He was placed on medical management for CAD and systolic HF. He was started on ASA, atorvastatin, Losartan andcarvedilol. Warfarin started for apical thrombusw/ heparin bridge.   Post cath, there were initial concerns for developing cardiogenic shock but he remained stable and did not require pressor/inotropic support. Co-ox remained stable. Repeat echo showed further reduction of LVEF down to 30-35% but no post MI mechanical complications. RV mildly reduced. He did require IVfurosemidefor pulmonary edema and volume status and dyspnea improved w/ diuresis. He was continued on GDMT w/ losartan, spironolactone andcarvedilol. BP too soft for Entresto. He was treated w/ colchicine for post MI pericarditis w/ improvement in pleuritic chest pain. Given anterior MI w/ EF <35%, he was fitted w/ a LifeVest prior to discharge w/ plans to get repeat echo in 1 month.  He had repeat echo in 9/21, showing EF 25-30%.  He had [place] placed in 11/21.   He recently returned for followup of CHF and CAD. Weight was up 4 lbs.  He reported using furosemide rarely.  He had been off digoxin for about a week and stated that he felt more energetic when taking digoxin.  Dyspnea walking up a hill or walking fast up stairs.   No dyspnea walking on flat ground.  No chest pain.  He was still smoking but trying to quit.  No orthopnea/PND/palpitations.   Today he returns to HF clinic for pharmacist medication titration. At last visit with MD, Delene Loll was increased to 49/51 mg BID and digoxin 0.125 mg daily was restarted. Aspirin was discontinued. Overall he is feeling well today. Has occasional dizziness when he stands up too quickly but this is not bothersome. No chest pain or palpitations. No SOB/DOE unless walking up inclines. Weight at home has been stable. Takes furosemide roughly 3 times per week. This is usually after he eats too much salt. No LEE, PND or orthopnea. Taking all medications as prescribed and tolerating all medications.    HF Medications: Carvedilol 6.25 mg BID Entresto 49/51 mg BID Spironolactone 25 mg daily Farxiga 10 mg daily Digoxin 0.125 mg daily Furosemide 20 mg PRN  Has the patient been experiencing any side effects to the medications prescribed?  no  Does the patient have any problems obtaining medications due to transportation or finances?   No - has [place] Medicaid  Understanding of regimen: good Understanding of indications: good Potential of compliance: good Patient understands to avoid NSAIDs. Patient understands to avoid decongestants.    Pertinent Lab Values: . 03/23/20: Serum creatinine 0.96, BUN 11, Potassium 3.9, Sodium 138, Digoxin 0.5 ng/mL (01/30/2020)  Vital Signs: . Weight: 183 lbs (last clinic weight: 182.8 lbs) . Blood pressure: 122/86  - states that his BP runs in 110/80s at home  . Heart rate: 78   Assessment/Plan: 1. CAD: Admission 8/21 for acute anterior MI, delayed presentation with no ischemic  pain, had pleuritic-type chest pain (post-MI pericarditis). LHC with totally occluded mid LAD w/o collaterals. Suspected completed MI, no intervention. Plan medical management. No ischemic chest pain.  - Not on ASA given warfarin use.  - Continue atorvastatin 80 mg  daily, good lipids in 11/21.   2. Chronic systolic CHF: Ischemic Cardiomyopathy.  Reduced EF post anterior MI.  Echo in 9/21 with EF 25-30%, peri-apical akinesis, moderate AS. Cath w/ occluded mLAD but no intervention as he had completed his infarct. He has [place].  -Currently NYHA class II, not volume overloaded on exam or by Heartlogic.  - Continue furosemide 20 mg PRN - Increasecarvedilolto 12.5 mgBID. -ContinueEntresto 49/51mg  BID. Plan on increasing next visit pending BP.  -Continuespironolactone25mg  daily - Continue dapagliflozin 10 mg daily.  - Continue digoxin 0.125 mg daily, he is less fatigued when he takes it.  - Return to Pharmacy Clinic in 4 weeks.  3. Post MI Pericarditis: Resolved.  4. LV thrombus: Large LV thrombus noted on echo in 8/21, resolved on 9/21 echo.   - Continue warfarin (can stop ASA).  5. H/o Tobacco Abuse: Still smoking, needs to quit.  - Recommended that he try nicotine patches.  6. HLD: Continue atorvastatin, good lipids 11/21.    Audry Riles, PharmD, BCPS, BCCP, CPP Heart Failure Clinic Pharmacist [phone]

## 2020-04-04 ENCOUNTER — Other Ambulatory Visit: Payer: Self-pay

## 2020-04-04 ENCOUNTER — Other Ambulatory Visit (HOSPITAL_COMMUNITY): Payer: Self-pay | Admitting: Cardiology

## 2020-04-04 ENCOUNTER — Ambulatory Visit (HOSPITAL_COMMUNITY)
Admission: RE | Admit: 2020-04-04 | Discharge: 2020-04-04 | Disposition: A | Discharge status: Home / Self Care | Payer: Medicaid Other | Source: Ambulatory Visit | Attending: Cardiology | Admitting: Cardiology

## 2020-04-04 VITALS — BP 122/86 | HR 78 | Wt 183.0 lb

## 2020-04-04 DIAGNOSIS — F17200 Nicotine dependence, unspecified, uncomplicated: Secondary | ICD-10-CM | POA: Diagnosis not present

## 2020-04-04 DIAGNOSIS — I255 Ischemic cardiomyopathy: Secondary | ICD-10-CM | POA: Diagnosis not present

## 2020-04-04 DIAGNOSIS — I2510 Atherosclerotic heart disease of native coronary artery without angina pectoris: Secondary | ICD-10-CM | POA: Diagnosis present

## 2020-04-04 DIAGNOSIS — I5022 Chronic systolic (congestive) heart failure: Secondary | ICD-10-CM | POA: Insufficient documentation

## 2020-04-04 DIAGNOSIS — E785 Hyperlipidemia, unspecified: Secondary | ICD-10-CM | POA: Insufficient documentation

## 2020-04-04 DIAGNOSIS — Z79899 Other long term (current) drug therapy: Secondary | ICD-10-CM | POA: Diagnosis not present

## 2020-04-04 DIAGNOSIS — I252 Old myocardial infarction: Secondary | ICD-10-CM | POA: Insufficient documentation

## 2020-04-04 DIAGNOSIS — R42 Dizziness and giddiness: Secondary | ICD-10-CM | POA: Diagnosis not present

## 2020-04-04 MED ORDER — CARVEDILOL 12.5 MG PO TABS: 12.5000 mg | ORAL_TABLET | Freq: Two times a day (BID) | ORAL | 11 refills | Status: DC

## 2020-04-04 MED FILL — FARXIGA 10 MG TABLET: 10 | 30 days supply | Qty: 30 | Fill #3

## 2020-04-04 MED FILL — CARVEDILOL 12.5 MG TABLET: 12.5 | 30 days supply | Qty: 60 | Fill #0

## 2020-04-04 MED FILL — ATORVASTATIN CALCIUM 80 MG: 80 | 30 days supply | Qty: 30 | Fill #1

## 2020-04-04 MED FILL — CARVEDILOL 6.25 MG TABLET: 6.25 | 30 days supply | Qty: 60 | Fill #2

## 2020-04-04 MED FILL — FUROSEMIDE 20 MG TABS: 20 | 30 days supply | Qty: 60 | Fill #3

## 2020-04-04 MED FILL — OMEGA-3 ETHYL ESTERS 1 GM C: 1 | 30 days supply | Qty: 120 | Fill #0

## 2020-04-04 NOTE — Patient Instructions (Addendum)
It was a pleasure seeing you today!  MEDICATIONS: -We are changing your medications today -Increase carvedilol to 12.5 mg (1 tablet) twice daily. You may take 2 tablets of the 6.25 mg strength twice daily until you receive the new strength.  -Call if you have questions about your medications.   NEXT APPOINTMENT: Return to clinic in 1 month with Pharmacy Clinic  In general, to take care of your heart failure: -Limit your fluid intake to 2 Liters (half-gallon) per day.   -Limit your salt intake to ideally 2-3 grams (2000-3000 mg) per day. -Weigh yourself daily and record, and bring that "weight diary" to your next appointment.  (Weight gain of 2-3 pounds in 1 day typically means fluid weight.) -The medications for your heart are to help your heart and help you live longer.   -Please contact us before stopping any of your heart medications.  Call the clinic at [phone] with questions or to reschedule future appointments.

## 2020-04-05 ENCOUNTER — Ambulatory Visit (INDEPENDENT_AMBULATORY_CARE_PROVIDER_SITE_OTHER): Payer: Medicaid Other | Admitting: *Deleted

## 2020-04-05 DIAGNOSIS — I513 Intracardiac thrombosis, not elsewhere classified: Secondary | ICD-10-CM | POA: Diagnosis not present

## 2020-04-05 DIAGNOSIS — Z7901 Long term (current) use of anticoagulants: Secondary | ICD-10-CM | POA: Diagnosis not present

## 2020-04-05 DIAGNOSIS — Z5181 Encounter for therapeutic drug level monitoring: Secondary | ICD-10-CM | POA: Diagnosis not present

## 2020-04-05 LAB — POCT INR: INR: 2.9 (ref 2.0–3.0)

## 2020-04-05 NOTE — Patient Instructions (Signed)
Description   Continue taking Warfarin 1.5 tablets daily except for 2 tablets on Sunday, Tuesday and Thursday. Be consistent with your 2 servings of greens. Recheck INR in 6 weeks. Call coumadin clinic for any changes in medications or upcoming procedures. [phone].   BILL [id number]

## 2020-04-11 ENCOUNTER — Other Ambulatory Visit: Payer: Self-pay | Admitting: Cardiology

## 2020-04-24 MED FILL — SERTRALINE HCL 100 MG TAB: 100 | 30 days supply | Qty: 45 | Fill #2

## 2020-04-24 MED FILL — SPIRONOLACTONE 25 MG TABLET: 25 | 30 days supply | Qty: 30 | Fill #4

## 2020-04-24 MED FILL — MIRTAZAPINE 45 MG TABLET: 45 | 30 days supply | Qty: 30 | Fill #2

## 2020-04-28 ENCOUNTER — Other Ambulatory Visit: Payer: Self-pay

## 2020-05-03 ENCOUNTER — Other Ambulatory Visit: Payer: Self-pay

## 2020-05-03 MED FILL — Atorvastatin Calcium Tab 80 MG (Base Equivalent): ORAL | 30 days supply | Qty: 30 | Fill #0 | Status: AC

## 2020-05-03 MED FILL — Omega-3-acid Ethyl Esters Cap 1 GM: ORAL | 15 days supply | Qty: 60 | Fill #0 | Status: AC

## 2020-05-03 MED FILL — Dapagliflozin Propanediol Tab 10 MG (Base Equivalent): ORAL | 30 days supply | Qty: 30 | Fill #0 | Status: AC

## 2020-05-04 ENCOUNTER — Other Ambulatory Visit: Payer: Self-pay

## 2020-05-08 NOTE — Progress Notes (Incomplete)
***In Progress*** Referring Physician: HF Cardiology: Dr. Aundra Dubin  HPI:  55 y.o. male smoker was admitted on 8/1/21for delayed presentation anterior STEMI. Had had >36 hr of chest pain prior to seeking medical attention. Pain progressed to more pleuritic like CP. Found to have anterior ST elevations w/ precordial Q waves on admit. Hs troponin >27,000. Urgent cardiac cath showed totally occluded mid LAD w/o collaterals and 40% mid RCA stenosis. It was suspected he had completed his infarct and residual pleuritic CP was post-MI pericarditis. No intervention was performed. 2D echo demonstrated moderately reduced LVEF 45-50%, + apical thrombus and moderate AS. MV normal. RV systolic function mildly reduced.   He was placed on medical management for CAD and systolic HF. He was started on ASA, atorvastatin, losartan and carvedilol. Warfarin started for apical thrombusw/ heparin bridge.   Post cath, there were initial concerns for developing cardiogenic shock but he remained stable and did not require pressor/inotropic support. Co-ox remained stable. Repeat echo showed further reduction of LVEF down to 30-35% but no post MI mechanical complications. RV mildly reduced. He did require IV furosemide for pulmonary edema and volume status and dyspnea improved w/ diuresis. He was continued on GDMT w/ losartan, spironolactone and carvedilol. BP too soft for Entresto. He was treated w/ colchicine for post MI pericarditis w/ improvement in pleuritic chest pain. Given anterior MI w/ EF <35%, he was fitted w/ a LifeVest prior to discharge w/ plans to get repeat echo in 1 month.   He had repeat echo in 09/2019, showing EF 25-30%.  He had [employer] ICD placed in 11/2019.   Patient saw MD on 03/13/20 for follow-up of CHF and CAD. At that time, Delene Loll was increased to 49/51 mg BID and digoxin 0.125 mg daily was restarted. Aspirin was discontinued.  He returned to HF clinic on 04/04/20 for pharmacist medication  titration. Overall he was feeling well. Had occasional dizziness when he stood up too quickly but this was not bothersome. No chest pain or palpitations. No SOB/DOE unless walking up inclines. Weight at home had been stable. Was taking furosemide roughly 3 times per week. This was usually after he eats too much salt. No LEE, PND or orthopnea. Taking all medications as prescribed and tolerating all medications.   Today he returns to HF clinic for pharmacist medication titration. At last visit, carvedilol was increased to 12.5 mg BID. ***     Overall feeling ***. Dizziness, lightheadedness, fatigue:  Chest pain or palpitations:  How is your breathing?: *** SOB: Able to complete all ADLs. Activity level ***  Weight at home pounds. Takes furosemide/torsemide/bumex *** mg *** daily.  LEE PND/Orthopnea  Appetite *** Low-salt diet:   Physical Exam Cost/affordability of meds     -no labs today (last BMET 2/25, only increased carvedilol since) -incr Entresto to 97/103 mg BID - if BP room, labs have been stable, recheck BMET at next visit 5/17 -incr carvedilol to 25 mg BID - if HR room and dry -f/u with Dr. Aundra Dubin 5/17    HF Medications: Carvedilol 12.5 mg BID Entresto 49/51 mg BID Spironolactone 25 mg daily Farxiga 10 mg daily Digoxin 0.125 mg daily Furosemide 20 mg PRN  Has the patient been experiencing any side effects to the medications prescribed?  {YES NO:22349}  Does the patient have any problems obtaining medications due to transportation or finances?   {YES NO:22349}  Understanding of regimen: {excellent/good/fair/poor:19665} Understanding of indications: {excellent/good/fair/poor:19665} Potential of compliance: {excellent/good/fair/poor:19665} Patient understands to avoid NSAIDs. Patient understands  to avoid decongestants.    Pertinent Lab Values: . Serum creatinine ***, BUN ***, Potassium ***, Sodium ***, BNP ***, Magnesium ***, Digoxin ***   Vital  Signs: . Weight: *** (last clinic weight: ***) . Blood pressure: ***  . Heart rate: ***   Assessment/Plan: 1. CAD: Admission 8/21 for acute anterior MI, delayed presentation with no ischemic pain, had pleuritic-type chest pain (post-MI pericarditis). LHC with totally occluded mid LAD w/o collaterals. Suspected completed MI, no intervention. Plan medical management. No ischemic chest pain.  -Not on ASA given warfarin use. - Continue atorvastatin 80 mg daily, good lipids in 11/21.  2. Chronic systolic CHF: Ischemic Cardiomyopathy. Reduced EF post anterior MI. Echo in 9/21 with EF 25-30%, peri-apical akinesis, moderate AS. Cath w/ occluded mLAD but no intervention as he had completed his infarct. He has [place]. -Currently NYHA class II, not volume overloaded on exam.  - Continue furosemide 20 mg PRN - Continue carvedilol 12.5 mgBID.*** -ContinueEntresto 49/51mg  BID. *** -Continuespironolactone25mg  daily - Continue dapagliflozin 10 mg daily.  -Continuedigoxin 0.125 mg daily, he is less fatigued when he takes it.  3. Post MI Pericarditis:Resolved. 4. LV thrombus: Large LV thrombus noted on echo in 08/2019, resolved on 09/2019 echo.  - Continue warfarin(can stop ASA).  5. H/o Tobacco Abuse: Still smoking, needs to quit. - Was previously recommended on nicotine patches. 6. HLD: Continue atorvastatin, good lipids 11/2019.      Esmeralda Links (PharmD Candidate 2022)  Audry Riles, PharmD, BCPS, BCCP, CPP Heart Failure Clinic Pharmacist [phone]

## 2020-05-09 ENCOUNTER — Inpatient Hospital Stay (HOSPITAL_COMMUNITY): Admission: RE | Admit: 2020-05-09 | Payer: Medicaid Other | Source: Ambulatory Visit

## 2020-05-10 MED FILL — Sacubitril-Valsartan Tab 49-51 MG: ORAL | 30 days supply | Qty: 60 | Fill #0 | Status: AC

## 2020-05-10 MED FILL — Furosemide Tab 20 MG: ORAL | 30 days supply | Qty: 30 | Fill #0 | Status: AC

## 2020-05-14 ENCOUNTER — Other Ambulatory Visit: Payer: Self-pay

## 2020-05-15 ENCOUNTER — Other Ambulatory Visit: Payer: Self-pay

## 2020-05-15 MED FILL — Warfarin Sodium Tab 5 MG: ORAL | 28 days supply | Qty: 55 | Fill #0 | Status: AC

## 2020-05-15 MED FILL — Carvedilol Tab 12.5 MG: ORAL | 30 days supply | Qty: 60 | Fill #0 | Status: AC

## 2020-05-18 ENCOUNTER — Other Ambulatory Visit: Payer: Self-pay

## 2020-05-18 MED FILL — Omega-3-acid Ethyl Esters Cap 1 GM: ORAL | 30 days supply | Qty: 120 | Fill #1 | Status: AC

## 2020-05-23 ENCOUNTER — Other Ambulatory Visit: Payer: Self-pay

## 2020-05-23 ENCOUNTER — Encounter (HOSPITAL_COMMUNITY): Payer: Self-pay | Admitting: Psychiatry

## 2020-05-23 ENCOUNTER — Telehealth (INDEPENDENT_AMBULATORY_CARE_PROVIDER_SITE_OTHER): Payer: Medicaid Other | Admitting: Psychiatry

## 2020-05-23 DIAGNOSIS — F331 Major depressive disorder, recurrent, moderate: Secondary | ICD-10-CM | POA: Diagnosis not present

## 2020-05-23 DIAGNOSIS — F411 Generalized anxiety disorder: Secondary | ICD-10-CM | POA: Diagnosis not present

## 2020-05-23 MED ORDER — HYDROXYZINE HCL 10 MG PO TABS
ORAL_TABLET | Freq: Three times a day (TID) | ORAL | 2 refills | Status: DC | PRN
  Filled 2020-05-23: qty 90, 30d supply, fill #0

## 2020-05-23 MED ORDER — SERTRALINE HCL 100 MG PO TABS
ORAL_TABLET | ORAL | 2 refills | Status: DC
  Filled 2020-05-23: qty 45, 30d supply, fill #0
  Filled 2020-06-18: qty 45, 30d supply, fill #1
  Filled 2020-07-19: qty 45, 30d supply, fill #2

## 2020-05-23 MED ORDER — MIRTAZAPINE 45 MG PO TABS
ORAL_TABLET | ORAL | 2 refills | Status: DC
  Filled 2020-05-23: qty 30, 30d supply, fill #0
  Filled 2020-06-18: qty 30, 30d supply, fill #1
  Filled 2020-07-20: qty 30, 30d supply, fill #2

## 2020-05-23 MED FILL — Spironolactone Tab 25 MG: ORAL | 30 days supply | Qty: 30 | Fill #0 | Status: AC

## 2020-05-23 NOTE — Progress Notes (Signed)
BH MD/PA/NP OP Progress Note Virtual Visit via Video Note  I connected with Craig Flores on 05/23/20 at 10:30 AM EDT by a video enabled telemedicine application and verified that I am speaking with the correct person using two identifiers.  Location: Patient: Home Provider: Clinic   I discussed the limitations of evaluation and management by telemedicine and the availability of in person appointments. The patient expressed understanding and agreed to proceed.  I provided 30 minutes of non-face-to-face time during this encounter.    05/23/2020 10:41 AM Craig Flores  MRN:  [id number]  Chief Complaint: "Things are okay"  HPI:  55 year old male seen today for follow up psychiatric evaluation. He has a psychiatric history of alcohol dependence, substance induced mood disorder, anxiety, depression, and SI. He is currently being managed on Zoloft 150mg  daily, Trazodone 50 mg nightly as needed, hydroxyzine 10 mg three times daily as needed, and Mirtazapine 45 mg at nightly. He notes that his medications are effective in managing his psychiatric conditions.  Today he is well groomed, pleasant, cooperative, engaged in conversation and maintained eye contact. He notes that things have been going well.  He informed provider that his anxiety and depression continue to be minimal.  Today provider conducted a GAD-7 and patient scored an 8, at his last visit he scored a 9.  Provider also conducted a PHQ-9 and patient scored a 7, at his last visit he scored a 6.  He endorses adequate sleep and appetite.  Today he denies SI/HI/VAH or paranoia.    Patient notes that he continues to be followed by cardiology and recently had adjustments in his cardiac medications.  He notes that overall his physical health is doing well and notes that today he plans to spend the rest of the day writing and catching up on bills.  He also informed Probation officer that his family is doing well.    Provider informed patient that  currently he is on 3 antidepressants and recommended that one be taken away to prevent serotonin syndrome.  He endorsed understanding and was agreeable to discontinuing trazodone 50 mg nightly.  He notes that he only takes it a few times at week.  Provider informed patient that if he has problems sleeping without trazodone we may consider increasing hydroxyzine at his next visit to help manage sleep.  He will continue all other medications as prescribed.     No other concerns noted at this time.  Visit Diagnosis:    ICD-10-CM   1. Generalized anxiety disorder  F41.1 hydrOXYzine (ATARAX/VISTARIL) 10 MG tablet    mirtazapine (REMERON) 45 MG tablet    sertraline (ZOLOFT) 100 MG tablet  2. Moderate episode of recurrent major depressive disorder (HCC)  F33.1 mirtazapine (REMERON) 45 MG tablet    sertraline (ZOLOFT) 100 MG tablet    Past Psychiatric History: alcohol dependence, substance induced mood disorder, anxiety, depression, and SI.   Past Medical History:  Past Medical History:  Diagnosis Date  . Acid reflux   . CHF (congestive heart failure) ([place])   . Depression   . Migraines   . Myocardial infarction [facility])     Past Surgical History:  Procedure Laterality Date  . CORONARY/GRAFT ACUTE MI REVASCULARIZATION N/A 08/28/2019   Procedure: Coronary/Graft Acute MI Revascularization;  Surgeon: [name], MD;  Location: [place] [facility];  Service: Cardiovascular;  Laterality: N/A;  . ICD IMPLANT N/A 11/28/2019   Procedure: ICD IMPLANT;  Surgeon: Deboraha Sprang, MD;  Location: [place]  CV [facility];  Service: Cardiovascular;  Laterality: N/A;  . LEFT HEART CATH AND CORONARY ANGIOGRAPHY N/A 08/28/2019   Procedure: LEFT HEART CATH AND CORONARY ANGIOGRAPHY;  Surgeon: [name], MD;  Location: [place] [facility];  Service: Cardiovascular;  Laterality: N/A;    Family Psychiatric History: Notes that his mother and aunt had mental health issues. Notes he belives they had anxiety  Family  History:  Family History  Problem Relation Age of Onset  . Arrhythmia Father     Social History:  Social History   Socioeconomic History  . Marital status: Married    Spouse name: Not on file  . Number of children: Not on file  . Years of education: Not on file  . Highest education level: Not on file  Occupational History  . Not on file  Tobacco Use  . Smoking status: Current Every Day Smoker    Packs/day: 0.50    Types: Cigarettes    Last attempt to quit: 09/05/2019    Years since quitting: 0.7  . Smokeless tobacco: Never Used  . Tobacco comment: x15 yrs as of 2021  Vaping Use  . Vaping Use: Never used  Substance and Sexual Activity  . Alcohol use: Not Currently    Comment: previous use but has not drank in months  . Drug use: Never  . Sexual activity: Yes    Birth control/protection: None  Other Topics Concern  . Not on file  Social History Narrative  . Not on file   Social Determinants of Health   Financial Resource Strain: Not on file  Food Insecurity: Not on file  Transportation Needs: Not on file  Physical Activity: Not on file  Stress: Not on file  Social Connections: Not on file    Allergies: No Known Allergies  Metabolic Disorder Labs: Lab Results  Component Value Date   HGBA1C 5.8 (H) 08/28/2019   MPG 119.76 08/28/2019   No results found for: PROLACTIN Lab Results  Component Value Date   CHOL 143 12/13/2019   TRIG 196 (H) 12/13/2019   HDL 37 (L) 12/13/2019   CHOLHDL 3.9 12/13/2019   VLDL 39 12/13/2019   LDLCALC 67 12/13/2019   [place] 64 11/17/2019   Lab Results  Component Value Date   TSH 3.181 08/28/2019    Therapeutic Level Labs: No results found for: LITHIUM No results found for: VALPROATE No components found for:  CBMZ  Current Medications: Current Outpatient Medications  Medication Sig Dispense Refill  . acetaminophen (TYLENOL) 500 MG tablet Take 500 mg by mouth every 6 (six) hours as needed for headache.    Marland Kitchen atorvastatin  (LIPITOR) 80 MG tablet TAKE 1 TABLET (80 MG TOTAL) BY MOUTH DAILY. 30 tablet 4  . carvedilol (COREG) 12.5 MG tablet TAKE 1 TABLET (12.5 MG TOTAL) BY MOUTH 2 (TWO) TIMES DAILY WITH A MEAL. 60 tablet 11  . dapagliflozin propanediol (FARXIGA) 10 MG TABS tablet TAKE 1 TABLET (10 MG TOTAL) BY MOUTH DAILY BEFORE BREAKFAST. 30 tablet 11  . digoxin (LANOXIN) 0.125 MG tablet TAKE 1 TABLET (0.125 MG TOTAL) BY MOUTH DAILY. 90 tablet 3  . furosemide (LASIX) 20 MG tablet TAKE 1 TABLET (20 MG TOTAL) BY MOUTH AS NEEDED FOR FLUID OR EDEMA (TAKE IF YOU GAIN MORE THAN 5 LB IN A WEEK). 30 tablet 10  . hydrOXYzine (ATARAX/VISTARIL) 10 MG tablet TAKE 1 TABLET (10 MG TOTAL) BY MOUTH 3 (THREE) TIMES DAILY AS NEEDED. 90 tablet 2  . mirtazapine (REMERON) 45 MG  tablet TAKE 1 TABLET (45 MG TOTAL) BY MOUTH AT BEDTIME. 30 tablet 2  . Multiple Vitamin (MULTIVITAMIN WITH MINERALS) TABS tablet Take 1 tablet by mouth daily.    Marland Kitchen omega-3 acid ethyl esters (LOVAZA) 1 g capsule TAKE 2 CAPSULES (2 G TOTAL) BY MOUTH TWO TIMES DAILY. 60 capsule 4  . omeprazole (PRILOSEC) 20 MG capsule Take 20 mg by mouth as needed.    . sacubitril-valsartan (ENTRESTO) 49-51 MG TAKE 1 TABLET BY MOUTH 2 (TWO) TIMES DAILY. 180 tablet 3  . sertraline (ZOLOFT) 100 MG tablet TAKE 1.5 TABLETS (150 MG TOTAL) BY MOUTH DAILY. 45 tablet 2  . SILDENAFIL CITRATE PO Take 1-2 tablets by mouth daily as needed (ED). 30 mg tablet - take 1 - 2 tablets 30 to 45 min before sexual activity as needed    . spironolactone (ALDACTONE) 25 MG tablet TAKE 1 TABLET (25 MG TOTAL) BY MOUTH DAILY. 30 tablet 11  . warfarin (COUMADIN) 5 MG tablet TAKE 1 AND 1/2 TABLETS TO 2 TABLETS BY MOUTH DAILY AS DIRECTED BY THE COUMADIN CLINIC. 55 tablet 1   No current facility-administered medications for this visit.     Musculoskeletal: Strength & Muscle Tone: Unable to assess due to telehealth visits Gait & Station: Unable to assess due to telehealth visits Patient leans: N/A  Psychiatric  Specialty Exam: Review of Systems  There were no vitals taken for this visit.There is no height or weight on file to calculate BMI.  General Appearance: Well Groomed  Eye Contact:  Good  Speech:  Clear and Coherent and Normal Rate  Volume:  Normal  Mood:  Euthymic  Affect:  Appropriate and Congruent  Thought Process:  Coherent, Goal Directed and Linear  Orientation:  Full (Time, Place, and Person)  Thought Content: WDL and Logical   Suicidal Thoughts:  No  Homicidal Thoughts:  No  Memory:  Immediate;   Good Recent;   Good Remote;   Good  Judgement:  Good  Insight:  Good  Psychomotor Activity:  Normal  Concentration:  Concentration: Good and Attention Span: Good  Recall:  Good  Fund of Knowledge: Good  Language: Good  Akathisia:  No  Handed:  Right  AIMS (if indicated): Not done  Assets:  Communication Skills Desire for Improvement Financial Resources/Insurance Housing Intimacy Social Support  ADL's:  Intact  Cognition: WNL  Sleep:  Good   Screenings: AIMS   Flowsheet Row Admission (Discharged) from 04/26/2014 in [facility]  AIMS Total Score 0    AUDIT   Flowsheet Row Admission (Discharged) from 04/26/2014 in [facility]  Alcohol Use Disorder Identification Test Final Score (AUDIT) 19    GAD-7   Flowsheet Row Video Visit from 05/23/2020 in [facility] Video Visit from 02/23/2020 in [facility] Video Visit from 11/23/2019 in [facility] Counselor from 11/04/2019 in [facility]  Total GAD-7 Score 8 9 18 13     PHQ2-9   Flowsheet Row Video Visit from 05/23/2020 in [facility] Video Visit from 02/23/2020 in [facility] Video Visit from 11/23/2019 in [facility] Counselor from 11/04/2019 in [facility]  PHQ-2 Total Score 2 2 5 6   PHQ-9 Total Score 7 6 15 16     Flowsheet Row Video Visit from 05/23/2020 in [facility] No  Risk       Assessment and Plan: Patient notes that he is doing well on his current medication regimen.  Provider informed patient that currently he is on 3 antidepressants and recommended taking 1 away to reduce the risk of serotonin syndrome.  He is agreeable to discontinue trazodone 50 mg nightly as needed.  Provider informed patient that if he has difficulty sleeping without trazodone we may consider increasing hydroxyzine to help manage sleep.  He will continue all other medications as prescribed. 1. Generalized anxiety disorder  Continue- hydrOXYzine (ATARAX/VISTARIL) 10 MG tablet; TAKE 1 TABLET (10 MG TOTAL) BY MOUTH 3 (THREE) TIMES DAILY AS NEEDED.  Dispense: 90 tablet; Refill: 2 Continue- mirtazapine (REMERON) 45 MG tablet; Take 1 tablet (45 mg total) by mouth at bedtime.  Dispense: 30 tablet; Refill: 2 Continue- sertraline (ZOLOFT) 100 MG tablet; Take 1.5 tablets (150 mg total) by mouth daily.  Dispense: 45 tablet; Refill: 2  2. Moderate episode of recurrent major depressive disorder (HCC)  Continue- mirtazapine (REMERON) 45 MG tablet; Take 1 tablet (45 mg total) by mouth at bedtime.  Dispense: 30 tablet; Refill: 2 Continue- sertraline (ZOLOFT) 100 MG tablet; Take 1.5 tablets (150 mg total) by mouth daily.  Dispense: 45 tablet; Refill: 2  Follow up in 3 months Follow up with therapy  Salley Slaughter, NP 05/23/2020, 10:41 AM

## 2020-05-24 ENCOUNTER — Ambulatory Visit (INDEPENDENT_AMBULATORY_CARE_PROVIDER_SITE_OTHER): Payer: Medicaid Other

## 2020-05-24 ENCOUNTER — Other Ambulatory Visit: Payer: Self-pay

## 2020-05-24 DIAGNOSIS — Z7901 Long term (current) use of anticoagulants: Secondary | ICD-10-CM | POA: Diagnosis not present

## 2020-05-24 DIAGNOSIS — I513 Intracardiac thrombosis, not elsewhere classified: Secondary | ICD-10-CM | POA: Diagnosis not present

## 2020-05-24 DIAGNOSIS — Z5181 Encounter for therapeutic drug level monitoring: Secondary | ICD-10-CM | POA: Diagnosis not present

## 2020-05-24 LAB — POCT INR: INR: 4.1 — AB (ref 2.0–3.0)

## 2020-05-24 MED ORDER — WARFARIN SODIUM 5 MG PO TABS
ORAL_TABLET | ORAL | 1 refills | Status: DC
  Filled 2020-05-24: qty 55, fill #0
  Filled 2020-06-18: qty 55, 27d supply, fill #0
  Filled 2020-07-19: qty 55, 28d supply, fill #1

## 2020-05-24 NOTE — Patient Instructions (Signed)
Description   Skip today's dosage of Warfarin, then take 1 tablet tomorrow, then resume same dosage of Warfarin 1.5 tablets daily except for 2 tablets on Sundays, Tuesdays and Thursdays. Be consistent with your 2 servings of greens. Recheck INR in 2 weeks. Call coumadin clinic for any changes in medications or upcoming procedures. [phone].   BILL [id number]

## 2020-05-28 ENCOUNTER — Other Ambulatory Visit: Payer: Self-pay

## 2020-05-28 MED FILL — Atorvastatin Calcium Tab 80 MG (Base Equivalent): ORAL | 30 days supply | Qty: 30 | Fill #1 | Status: AC

## 2020-05-29 ENCOUNTER — Ambulatory Visit (INDEPENDENT_AMBULATORY_CARE_PROVIDER_SITE_OTHER): Payer: Medicaid Other

## 2020-05-29 DIAGNOSIS — I255 Ischemic cardiomyopathy: Secondary | ICD-10-CM

## 2020-05-29 LAB — CUP PACEART REMOTE DEVICE CHECK
Battery Remaining Longevity: 180 mo
Battery Remaining Percentage: 100 %
Brady Statistic RV Percent Paced: 0 %
Date Time Interrogation Session: 20220503010200
HighPow Impedance: 84 Ohm
Implantable Lead Implant Date: 20211101
Implantable Lead Location: 753860
Implantable Lead Model: 138
Implantable Lead Serial Number: 303500
Implantable Pulse Generator Implant Date: 20211101
Lead Channel Impedance Value: 571 Ohm
Lead Channel Setting Pacing Amplitude: 3.5 V
Lead Channel Setting Pacing Pulse Width: 0.4 ms
Lead Channel Setting Sensing Sensitivity: 0.5 mV
Pulse Gen Serial Number: 209902

## 2020-06-05 ENCOUNTER — Other Ambulatory Visit: Payer: Self-pay

## 2020-06-05 MED FILL — Dapagliflozin Propanediol Tab 10 MG (Base Equivalent): ORAL | 30 days supply | Qty: 30 | Fill #1 | Status: AC

## 2020-06-06 ENCOUNTER — Other Ambulatory Visit: Payer: Self-pay

## 2020-06-07 ENCOUNTER — Ambulatory Visit (INDEPENDENT_AMBULATORY_CARE_PROVIDER_SITE_OTHER): Payer: Medicaid Other | Admitting: *Deleted

## 2020-06-07 ENCOUNTER — Other Ambulatory Visit: Payer: Self-pay

## 2020-06-07 DIAGNOSIS — Z5181 Encounter for therapeutic drug level monitoring: Secondary | ICD-10-CM | POA: Diagnosis not present

## 2020-06-07 DIAGNOSIS — I513 Intracardiac thrombosis, not elsewhere classified: Secondary | ICD-10-CM

## 2020-06-07 DIAGNOSIS — Z7901 Long term (current) use of anticoagulants: Secondary | ICD-10-CM | POA: Diagnosis not present

## 2020-06-07 LAB — POCT INR: INR: 2.8 (ref 2.0–3.0)

## 2020-06-07 NOTE — Patient Instructions (Signed)
Description   Continue same dosage of Warfarin 1.5 tablets daily except for 2 tablets on Sundays, Tuesdays and Thursdays. Be consistent with your 2 servings of greens. Recheck INR in 3 weeks. Call coumadin clinic for any changes in medications or upcoming procedures. [phone].   BILL [id number]

## 2020-06-11 ENCOUNTER — Other Ambulatory Visit: Payer: Self-pay

## 2020-06-11 MED FILL — Sacubitril-Valsartan Tab 49-51 MG: ORAL | 30 days supply | Qty: 60 | Fill #1 | Status: AC

## 2020-06-11 MED FILL — Furosemide Tab 20 MG: ORAL | 30 days supply | Qty: 30 | Fill #1 | Status: AC

## 2020-06-11 MED FILL — Digoxin Tab 125 MCG (0.125 MG): ORAL | 90 days supply | Qty: 90 | Fill #0 | Status: AC

## 2020-06-12 ENCOUNTER — Encounter (HOSPITAL_COMMUNITY): Payer: Self-pay | Admitting: Cardiology

## 2020-06-12 ENCOUNTER — Other Ambulatory Visit: Payer: Self-pay

## 2020-06-12 ENCOUNTER — Ambulatory Visit (HOSPITAL_COMMUNITY)
Admission: RE | Admit: 2020-06-12 | Discharge: 2020-06-12 | Disposition: A | Discharge status: Home / Self Care | Payer: Medicaid Other | Source: Ambulatory Visit | Attending: Cardiology | Admitting: Cardiology

## 2020-06-12 VITALS — BP 100/60 | HR 82 | Wt 186.6 lb

## 2020-06-12 DIAGNOSIS — Z7901 Long term (current) use of anticoagulants: Secondary | ICD-10-CM | POA: Insufficient documentation

## 2020-06-12 DIAGNOSIS — I252 Old myocardial infarction: Secondary | ICD-10-CM | POA: Insufficient documentation

## 2020-06-12 DIAGNOSIS — Z79899 Other long term (current) drug therapy: Secondary | ICD-10-CM | POA: Insufficient documentation

## 2020-06-12 DIAGNOSIS — I255 Ischemic cardiomyopathy: Secondary | ICD-10-CM | POA: Diagnosis not present

## 2020-06-12 DIAGNOSIS — F17210 Nicotine dependence, cigarettes, uncomplicated: Secondary | ICD-10-CM | POA: Insufficient documentation

## 2020-06-12 DIAGNOSIS — Z95810 Presence of automatic (implantable) cardiac defibrillator: Secondary | ICD-10-CM | POA: Diagnosis not present

## 2020-06-12 DIAGNOSIS — E785 Hyperlipidemia, unspecified: Secondary | ICD-10-CM | POA: Diagnosis not present

## 2020-06-12 DIAGNOSIS — I5022 Chronic systolic (congestive) heart failure: Secondary | ICD-10-CM | POA: Diagnosis not present

## 2020-06-12 DIAGNOSIS — I2510 Atherosclerotic heart disease of native coronary artery without angina pectoris: Secondary | ICD-10-CM | POA: Insufficient documentation

## 2020-06-12 LAB — BASIC METABOLIC PANEL
Anion gap: 9 (ref 5–15)
BUN: 24 mg/dL — ABNORMAL HIGH (ref 6–20)
CO2: 24 mmol/L (ref 22–32)
Calcium: 9.8 mg/dL (ref 8.9–10.3)
Chloride: 103 mmol/L (ref 98–111)
Creatinine, Ser: 1.06 mg/dL (ref 0.61–1.24)
GFR, Estimated: >60 mL/min (ref >60)
Glucose, Bld: 156 mg/dL — ABNORMAL HIGH (ref 70–99)
Potassium: 4.4 mmol/L (ref 3.5–5.1)
Sodium: 136 mmol/L (ref 135–145)

## 2020-06-12 LAB — DIGOXIN LEVEL: Digoxin Level: 0.9 ng/mL (ref 0.8–2.0)

## 2020-06-12 MED ORDER — FUROSEMIDE 20 MG PO TABS
20.0000 mg | ORAL_TABLET | Freq: Every day | ORAL | 3 refills | Status: DC
  Filled 2020-06-12 – 2020-07-12 (×2): qty 90, 90d supply, fill #0
  Filled 2020-10-15: qty 90, 90d supply, fill #1
  Filled 2021-01-06: qty 90, 90d supply, fill #2

## 2020-06-12 NOTE — Patient Instructions (Signed)
START Lasix 20mg  (1 tab) daily  USE Nicotine patch to quit smoking  Labs today We will only contact you if something comes back abnormal or we need to make some changes. Otherwise no news is good news!  Your physician has requested that you have an echocardiogram. Echocardiography is a painless test that uses sound waves to create images of your heart. It provides your doctor with information about the size and shape of your heart and how well your heart's chambers and valves are working. This procedure takes approximately one hour. There are no restrictions for this procedure.   Your physician has recommended that you have a cardiopulmonary stress test (CPX). CPX testing is a non-invasive measurement of heart and lung function. It replaces a traditional treadmill stress test. This type of test provides a tremendous amount of information that relates not only to your present condition but also for future outcomes. This test combines measurements of you ventilation, respiratory gas exchange in the lungs, electrocardiogram (EKG), blood pressure and physical response before, during, and following an exercise protocol.   Your physician recommends that you schedule a follow-up appointment in: 3 months with an echo.   Please call office at [phone] option 2 if you have any questions or concerns.   At the [facility], you and your health needs are our priority. As part of our continuing mission to provide you with exceptional heart care, we have created designated Provider Care Teams. These Care Teams include your primary Cardiologist (physician) and Advanced Practice Providers (APPs- Physician Assistants and Nurse Practitioners) who all work together to provide you with the care you need, when you need it.   You may see any of the following providers on your designated Care Team at your next follow up: Marland Kitchen Dr Glori Bickers . Dr Loralie Champagne . Dr Vickki Muff . Darrick Grinder,  NP . Lyda Jester, [place] . Audry Riles, PharmD   Please be sure to bring in all your medications bottles to every appointment.

## 2020-06-12 NOTE — Progress Notes (Signed)
ReDS Vest / Clip - 06/12/20 1100      ReDS Vest / Clip   Station Marker D    Ruler Value 33.5    ReDS Value Range Low volume    ReDS Actual Value 30

## 2020-06-12 NOTE — Progress Notes (Signed)
Advanced Heart Failure Clinic Note   PCP: Pcp, [name] Cardiology: Dr. Aundra Dubin  Reason for Visit: Heart Failure    HPI: 55 y.o. male smoker was admitted on 08/28/19 for delayed presentation anterior STEMI. Had had >36 hr of chest pain prior to seeking medical attention. Pain progressed to more pleuritic like CP. Found to have anterior ST elevations w/ precordial Q waves on admit. Hs troponin >27,000. Urgent cardiac cath showed totally occluded mid LAD w/o collaterals and 40% mid RCA stenosis. It was suspected he had completed his infarct and residual pleuritic CP was post-MI pericarditis. No intervention was performed. 2D echo demonstrated moderately reduced LVEF, 45-50%, + apical thrombus and moderate AS. MV normal. RV systolic function mildly reduced.   He was placed on medical management for CAD and systolic HF. He was started on ASA, atorvastatin, Losartan and Coreg. Coumadin started for apical thrombus w/ heparin bridge.   Post cath, there were initial concerns for developing cardiogenic shock but he remained stable and did not require pressor/inotropic support. Co-ox remained stable. Repeat echo showed further reduction of LVEF down to 30-35% but no post MI mechanical complications. RV mildly reduced. He did require IV Lasix for pulmonary edema and volume status and dyspnea improved w/ diuresis. He was continued on GTDMT w/ losartan, spironolactone and Coreg. BP too soft for Entresto. He was treated w/ colchicine for post MI pericarditis w/ improvement in pleuritic chest pain. Given anterior MI w/ EF <35%, he was fitted w/ a LifeVest prior to discharge w/ plans to get repeat echo in 1 month.   He had repeat echo in 9/21, showing EF 25-30%.  He had [place] placed in 11/21.   He returns for followup of CHF and CAD. Weight is up 4 lbs.  Still smoking but trying to cut back. Taking Lasix 20 mg daily.  Mildly short of breath walking up hills, stairs.  No dyspnea on flat ground.  No  orthopnea/PND.  He gets lightheaded if he stands up too fast.   ECG (personally reviewed): NSR, LAFB, old inferior MI, old anterior MI.  REDS clip 31%   Labs (9/21): K 4.3, creatinine 0.93 Labs (11/21): LDL 67, HDL 37, K 4.4, creatinine 0.81 Labs (1/22): digoxin 0.5, K 4, creatinine 1.1.   [employer] Device interrogation: Heartlogic score 0  PMH: 1. GERD 2. Depression  3. CAD: Anterior STEMI in 8/21 with totally occluded LAD/no collaterals, 40% mid RCA.  Due to late presentation, no PCI was done.  - Had post-infarct pericarditis.  4. Chronic systolic CHF: Ischemic cardiomyopathy. [place].  - Echo (9/21) with EF 25-30%, normal RV, moderate AS with mean gradient 22 mmHg.  5. LV thrombus: Apical thrombus noted on echo at time of MI in 8/21. Resolved by 9/21 echo.  6. Aortic stenosis: Moderate on 9/21 echo.  7. Active smoker.   Current Outpatient Medications  Medication Sig Dispense Refill  . acetaminophen (TYLENOL) 500 MG tablet Take 500 mg by mouth every 6 (six) hours as needed for headache.    Marland Kitchen atorvastatin (LIPITOR) 80 MG tablet TAKE 1 TABLET (80 MG TOTAL) BY MOUTH DAILY. 30 tablet 4  . carvedilol (COREG) 12.5 MG tablet TAKE 1 TABLET (12.5 MG TOTAL) BY MOUTH 2 (TWO) TIMES DAILY WITH A MEAL. 60 tablet 11  . dapagliflozin propanediol (FARXIGA) 10 MG TABS tablet TAKE 1 TABLET (10 MG TOTAL) BY MOUTH DAILY BEFORE BREAKFAST. 30 tablet 11  . digoxin (LANOXIN) 0.125 MG tablet TAKE 1 TABLET (0.125  MG TOTAL) BY MOUTH DAILY. 90 tablet 3  . furosemide (LASIX) 20 MG tablet Take 1 tablet (20 mg total) by mouth daily. 90 tablet 3  . hydrOXYzine (ATARAX/VISTARIL) 10 MG tablet TAKE 1 TABLET (10 MG TOTAL) BY MOUTH 3 (THREE) TIMES DAILY AS NEEDED. 90 tablet 2  . mirtazapine (REMERON) 45 MG tablet TAKE 1 TABLET (45 MG TOTAL) BY MOUTH AT BEDTIME. 30 tablet 2  . Multiple Vitamin (MULTIVITAMIN WITH MINERALS) TABS tablet Take 1 tablet by mouth daily.    Marland Kitchen omega-3 acid ethyl esters  (LOVAZA) 1 g capsule TAKE 2 CAPSULES (2 G TOTAL) BY MOUTH TWO TIMES DAILY. 60 capsule 4  . omeprazole (PRILOSEC) 20 MG capsule Take 20 mg by mouth as needed.    . sacubitril-valsartan (ENTRESTO) 49-51 MG TAKE 1 TABLET BY MOUTH 2 (TWO) TIMES DAILY. 180 tablet 3  . sertraline (ZOLOFT) 100 MG tablet TAKE 1.5 TABLETS (150 MG TOTAL) BY MOUTH DAILY. 45 tablet 2  . SILDENAFIL CITRATE PO Take 1-2 tablets by mouth daily as needed (ED). 30 mg tablet - take 1 - 2 tablets 30 to 45 min before sexual activity as needed    . spironolactone (ALDACTONE) 25 MG tablet TAKE 1 TABLET (25 MG TOTAL) BY MOUTH DAILY. 30 tablet 11  . warfarin (COUMADIN) 5 MG tablet TAKE 1 AND 1/2 TABLETS TO 2 TABLETS BY MOUTH DAILY AS DIRECTED BY THE COUMADIN CLINIC. 55 tablet 1   No current facility-administered medications for this encounter.    No Known Allergies    Social History   Socioeconomic History  . Marital status: Married    Spouse name: Not on file  . Number of children: Not on file  . Years of education: Not on file  . Highest education level: Not on file  Occupational History  . Not on file  Tobacco Use  . Smoking status: Current Every Day Smoker    Packs/day: 0.50    Types: Cigarettes    Last attempt to quit: 09/05/2019    Years since quitting: 0.7  . Smokeless tobacco: Never Used  . Tobacco comment: x15 yrs as of 2021  Vaping Use  . Vaping Use: Never used  Substance and Sexual Activity  . Alcohol use: Not Currently    Comment: previous use but has not drank in months  . Drug use: Never  . Sexual activity: Yes    Birth control/protection: None  Other Topics Concern  . Not on file  Social History Narrative  . Not on file   Social Determinants of Health   Financial Resource Strain: Not on file  Food Insecurity: Not on file  Transportation Needs: Not on file  Physical Activity: Not on file  Stress: Not on file  Social Connections: Not on file  Intimate Partner Violence: Not on file       Family History  Problem Relation Age of Onset  . Arrhythmia Father     Vitals:   06/12/20 1040  BP: 100/60  Pulse: 82  SpO2: 96%  Weight: 84.6 kg (186 lb 9.6 oz)   Wt Readings from Last 3 Encounters:  06/12/20 84.6 kg (186 lb 9.6 oz)  04/04/20 83 kg (183 lb)  03/13/20 82.9 kg (182 lb 12.8 oz)    PHYSICAL EXAM: General: NAD Neck: No JVD, no thyromegaly or thyroid nodule.  Lungs: Clear to auscultation bilaterally with normal respiratory effort. CV: Nondisplaced PMI.  Heart regular S1/S2, no S3/S4, no murmur.  No peripheral edema.  No carotid bruit.  Normal pedal pulses.  Abdomen: Soft, nontender, no hepatosplenomegaly, no distention.  Skin: Intact without lesions or rashes.  Neurologic: Alert and oriented x 3.  Psych: Normal affect. Extremities: No clubbing or cyanosis.  HEENT: Normal.   ASSESSMENT & PLAN: 1. CAD: Admission 8/21 for acute anterior MI, delayed presentation with no ischemic pain, had pleuritic-type chest pain (post-MI pericarditis). LHC with totally occluded mid LAD w/o collaterals. Suspected completed MI, no intervention. Plan medical management. No ischemic chest pain.  - No ASA given warfarin use.  - Continue atorvastatin 80 mg daily, good lipids in 11/21.  2. Chronic systolic CHF: Ischemic Cardiomyopathy.  Reduced EF post anterior MI.  Echo in 9/21 with EF 25-30%, peri-apical akinesis, moderate AS. Cath w/ occluded mLAD but no intervention as he had completed his infarct. He has [place].  Currently NYHA class II, not volume overloaded on exam, by REDS clip, or by Heartlogic.  - Continue Entresto 49/51 bid, no BP room to increase.  - Continue spironolactone 25 mg daily. BMET today.  - Continue Coreg 12.5 mg bid, no BP room to increase.   - Continue dapagliflozin 10 mg daily.  - Continue digoxin 0.125, check level today.   - Continue Lasix 20 mg daily.  - Repeat echo at followup in 3 months.  3. Post MI Pericarditis: Resolved.  4. LV  thrombus: Large LV thrombus noted on echo in 8/21, resolved on 9/21 echo.   - Continue warfarin.  5. H/o Tobacco Abuse: Still smoking, needs to quit. Dyspnea may be related to COPD rather than CHF.  - Recommended that he try nicotine patches.  6. HLD: Continue atorvastatin, good lipids 11/21.   Followup 3 months with echo.   Loralie Champagne, MD  06/12/2020

## 2020-06-13 ENCOUNTER — Encounter: Payer: Medicaid Other | Admitting: Internal Medicine

## 2020-06-18 ENCOUNTER — Other Ambulatory Visit: Payer: Self-pay

## 2020-06-18 ENCOUNTER — Other Ambulatory Visit: Payer: Self-pay | Admitting: Cardiology

## 2020-06-18 MED ORDER — OMEGA-3-ACID ETHYL ESTERS 1 G PO CAPS
ORAL_CAPSULE | ORAL | 0 refills | Status: DC
Start: 2020-06-18 — End: 2020-07-02
  Filled 2020-06-18: qty 60, 15d supply, fill #0

## 2020-06-18 MED FILL — Spironolactone Tab 25 MG: ORAL | 30 days supply | Qty: 30 | Fill #1 | Status: AC

## 2020-06-18 MED FILL — Carvedilol Tab 12.5 MG: ORAL | 30 days supply | Qty: 60 | Fill #1 | Status: AC

## 2020-06-19 ENCOUNTER — Other Ambulatory Visit: Payer: Self-pay

## 2020-06-20 NOTE — Progress Notes (Signed)
Remote ICD transmission.   

## 2020-06-28 ENCOUNTER — Ambulatory Visit (INDEPENDENT_AMBULATORY_CARE_PROVIDER_SITE_OTHER): Payer: Medicaid Other | Admitting: *Deleted

## 2020-06-28 ENCOUNTER — Other Ambulatory Visit: Payer: Self-pay

## 2020-06-28 DIAGNOSIS — Z7901 Long term (current) use of anticoagulants: Secondary | ICD-10-CM

## 2020-06-28 DIAGNOSIS — I513 Intracardiac thrombosis, not elsewhere classified: Secondary | ICD-10-CM

## 2020-06-28 DIAGNOSIS — Z5181 Encounter for therapeutic drug level monitoring: Secondary | ICD-10-CM | POA: Diagnosis not present

## 2020-06-28 LAB — POCT INR: INR: 2.8 (ref 2.0–3.0)

## 2020-06-28 MED FILL — Atorvastatin Calcium Tab 80 MG (Base Equivalent): ORAL | 30 days supply | Qty: 30 | Fill #2 | Status: AC

## 2020-06-28 NOTE — Patient Instructions (Signed)
Description   Continue same dosage of Warfarin 1.5 tablets daily except for 2 tablets on Sundays, Tuesdays and Thursdays. Be consistent with your 2 servings of greens. Recheck INR in 4 weeks. Call coumadin clinic for any changes in medications or upcoming procedures. [phone].   BILL [id number]

## 2020-06-29 ENCOUNTER — Other Ambulatory Visit: Payer: Self-pay

## 2020-07-02 ENCOUNTER — Other Ambulatory Visit: Payer: Self-pay | Admitting: Cardiology

## 2020-07-02 ENCOUNTER — Other Ambulatory Visit: Payer: Self-pay

## 2020-07-02 MED FILL — Dapagliflozin Propanediol Tab 10 MG (Base Equivalent): ORAL | 30 days supply | Qty: 30 | Fill #2 | Status: AC

## 2020-07-02 NOTE — Telephone Encounter (Signed)
This is a CHF pt, Dr. Mclean 

## 2020-07-05 ENCOUNTER — Other Ambulatory Visit: Payer: Self-pay

## 2020-07-05 MED ORDER — OMEGA-3-ACID ETHYL ESTERS 1 G PO CAPS
ORAL_CAPSULE | ORAL | 3 refills | Status: DC
  Filled 2020-07-05: qty 120, 30d supply, fill #0
  Filled 2020-07-31: qty 120, 30d supply, fill #1
  Filled 2020-09-04: qty 120, 30d supply, fill #2
  Filled 2020-10-03: qty 120, 30d supply, fill #3

## 2020-07-06 ENCOUNTER — Other Ambulatory Visit: Payer: Self-pay

## 2020-07-11 ENCOUNTER — Other Ambulatory Visit: Payer: Self-pay

## 2020-07-11 MED FILL — Sacubitril-Valsartan Tab 49-51 MG: ORAL | 30 days supply | Qty: 60 | Fill #2 | Status: AC

## 2020-07-12 ENCOUNTER — Other Ambulatory Visit: Payer: Self-pay

## 2020-07-19 ENCOUNTER — Other Ambulatory Visit: Payer: Self-pay

## 2020-07-19 MED FILL — Spironolactone Tab 25 MG: ORAL | 30 days supply | Qty: 30 | Fill #2 | Status: AC

## 2020-07-19 MED FILL — Carvedilol Tab 12.5 MG: ORAL | 30 days supply | Qty: 60 | Fill #2 | Status: AC

## 2020-07-20 ENCOUNTER — Other Ambulatory Visit: Payer: Self-pay

## 2020-07-26 ENCOUNTER — Other Ambulatory Visit: Payer: Self-pay

## 2020-07-26 ENCOUNTER — Ambulatory Visit (HOSPITAL_COMMUNITY): Payer: Medicaid Other | Attending: Internal Medicine

## 2020-07-26 ENCOUNTER — Other Ambulatory Visit (HOSPITAL_COMMUNITY): Payer: Self-pay | Admitting: *Deleted

## 2020-07-26 DIAGNOSIS — I5022 Chronic systolic (congestive) heart failure: Secondary | ICD-10-CM | POA: Insufficient documentation

## 2020-07-31 ENCOUNTER — Other Ambulatory Visit: Payer: Self-pay | Admitting: Internal Medicine

## 2020-07-31 ENCOUNTER — Ambulatory Visit (INDEPENDENT_AMBULATORY_CARE_PROVIDER_SITE_OTHER): Payer: Medicaid Other | Admitting: Pharmacist

## 2020-07-31 ENCOUNTER — Other Ambulatory Visit: Payer: Self-pay

## 2020-07-31 DIAGNOSIS — I513 Intracardiac thrombosis, not elsewhere classified: Secondary | ICD-10-CM

## 2020-07-31 DIAGNOSIS — Z7901 Long term (current) use of anticoagulants: Secondary | ICD-10-CM | POA: Diagnosis not present

## 2020-07-31 DIAGNOSIS — Z5181 Encounter for therapeutic drug level monitoring: Secondary | ICD-10-CM

## 2020-07-31 LAB — POCT INR: INR: 2.1 (ref 2.0–3.0)

## 2020-07-31 MED FILL — Dapagliflozin Propanediol Tab 10 MG (Base Equivalent): ORAL | 30 days supply | Qty: 30 | Fill #3 | Status: AC

## 2020-07-31 NOTE — Patient Instructions (Signed)
Continue same dosage of Warfarin 1.5 tablets daily except for 2 tablets on Sundays, Tuesdays and Thursdays. Be consistent with your 2 servings of greens. Recheck INR in 4 weeks. Call coumadin clinic for any changes in medications or upcoming procedures. [phone].

## 2020-08-02 ENCOUNTER — Other Ambulatory Visit: Payer: Self-pay

## 2020-08-02 MED ORDER — ATORVASTATIN CALCIUM 80 MG PO TABS
ORAL_TABLET | Freq: Every day | ORAL | 4 refills | Status: DC
  Filled 2020-08-02: qty 30, 30d supply, fill #0
  Filled 2020-08-29: qty 30, 30d supply, fill #1
  Filled 2020-09-24: qty 30, 30d supply, fill #2
  Filled 2020-10-29: qty 30, 30d supply, fill #3
  Filled 2020-11-26: qty 30, 30d supply, fill #4

## 2020-08-03 ENCOUNTER — Other Ambulatory Visit: Payer: Self-pay

## 2020-08-13 ENCOUNTER — Other Ambulatory Visit: Payer: Self-pay

## 2020-08-13 MED FILL — Sacubitril-Valsartan Tab 49-51 MG: ORAL | 30 days supply | Qty: 60 | Fill #3 | Status: AC

## 2020-08-14 ENCOUNTER — Other Ambulatory Visit: Payer: Self-pay

## 2020-08-17 ENCOUNTER — Other Ambulatory Visit: Payer: Self-pay

## 2020-08-17 MED FILL — Carvedilol Tab 12.5 MG: ORAL | 30 days supply | Qty: 60 | Fill #3 | Status: AC

## 2020-08-22 ENCOUNTER — Telehealth (INDEPENDENT_AMBULATORY_CARE_PROVIDER_SITE_OTHER): Payer: Medicaid Other | Admitting: Psychiatry

## 2020-08-22 ENCOUNTER — Other Ambulatory Visit: Payer: Self-pay

## 2020-08-22 ENCOUNTER — Other Ambulatory Visit: Payer: Self-pay | Admitting: Cardiology

## 2020-08-22 ENCOUNTER — Encounter (HOSPITAL_COMMUNITY): Payer: Self-pay | Admitting: Psychiatry

## 2020-08-22 DIAGNOSIS — F331 Major depressive disorder, recurrent, moderate: Secondary | ICD-10-CM

## 2020-08-22 DIAGNOSIS — F411 Generalized anxiety disorder: Secondary | ICD-10-CM | POA: Diagnosis not present

## 2020-08-22 MED ORDER — SERTRALINE HCL 100 MG PO TABS
ORAL_TABLET | ORAL | 3 refills | Status: DC
  Filled 2020-08-22: qty 45, 30d supply, fill #0
  Filled 2020-09-17: qty 45, 30d supply, fill #1
  Filled 2020-10-15: qty 45, 30d supply, fill #2
  Filled 2020-11-19: qty 45, 30d supply, fill #3

## 2020-08-22 MED ORDER — WARFARIN SODIUM 5 MG PO TABS
ORAL_TABLET | ORAL | 1 refills | Status: DC
  Filled 2020-08-22: qty 55, 27d supply, fill #0
  Filled 2020-09-24: qty 55, 27d supply, fill #1

## 2020-08-22 MED ORDER — MIRTAZAPINE 45 MG PO TABS
ORAL_TABLET | ORAL | 3 refills | Status: DC
  Filled 2020-08-22: qty 30, 30d supply, fill #0

## 2020-08-22 MED ORDER — HYDROXYZINE HCL 10 MG PO TABS
ORAL_TABLET | Freq: Three times a day (TID) | ORAL | 3 refills | Status: DC | PRN
  Filled 2020-08-22: qty 90, 30d supply, fill #0

## 2020-08-22 MED FILL — Spironolactone Tab 25 MG: ORAL | 30 days supply | Qty: 30 | Fill #3 | Status: AC

## 2020-08-22 NOTE — Telephone Encounter (Signed)
Prescription refill request received for warfarin [place]: 06/12/20 Aundra Dubin) Next INR check: 08/28/20 Warfarin tablet strength: '5mg'$ 

## 2020-08-22 NOTE — Progress Notes (Signed)
BH MD/PA/NP OP Progress Note Virtual Visit via Telephone Note  I connected with Craig Flores on 08/22/20 at 10:30 AM EDT by telephone and verified that I am speaking with the correct person using two identifiers.  Location: Patient: home Provider: Clinic   I discussed the limitations, risks, security and privacy concerns of performing an evaluation and management service by telephone and the availability of in person appointments. I also discussed with the patient that there may be a patient responsible charge related to this service. The patient expressed understanding and agreed to proceed.   I provided 30 minutes of non-face-to-face time during this encounter.     08/22/2020 10:56 AM Craig Flores  MRN:  [id number]  Chief Complaint: "I'm managing"  HPI:  55 year old male seen today for follow up psychiatric evaluation. He has a psychiatric history of alcohol dependence, substance induced mood disorder, anxiety, depression, and SI. He is currently being managed on Zoloft '150mg'$  daily,  hydroxyzine 10 mg three times daily as needed, and Mirtazapine 45 mg at nightly. He notes that his medications are effective in managing his psychiatric conditions.   Today he is unable to logon virtually so assessment was done over the phone.  During exam he was pleasant, cooperative, and engaged in conversation. He notes that he has been managing well since his last visit.  He informed Probation officer that he is spending his days writing.  He notes that he continues to have minimal anxiety and depression.  Today provider conducted a GAD-7 and patient scored a 3, at his last visit he scored a 9. Provider also conducted a PHQ-9 and patient scored a 3, at his last visit he scored a 7.  He endorses adequate sleep and appetite.  Today he denies SI/HI/VAH or paranoia.    No medication changes made today.  Patient agreeable to continue medications as prescribed.  No other concerns noted at this time.  Visit  Diagnosis:    ICD-10-CM   1. Generalized anxiety disorder  F41.1 hydrOXYzine (ATARAX/VISTARIL) 10 MG tablet    mirtazapine (REMERON) 45 MG tablet    sertraline (ZOLOFT) 100 MG tablet    2. Moderate episode of recurrent major depressive disorder (HCC)  F33.1 mirtazapine (REMERON) 45 MG tablet    sertraline (ZOLOFT) 100 MG tablet      Past Psychiatric History: alcohol dependence, substance induced mood disorder, anxiety, depression, and SI.   Past Medical History:  Past Medical History:  Diagnosis Date   Acid reflux    CHF (congestive heart failure) ([place])    Depression    Migraines    Myocardial infarction [facility])     Past Surgical History:  Procedure Laterality Date   CORONARY/GRAFT ACUTE MI REVASCULARIZATION N/A 08/28/2019   Procedure: Coronary/Graft Acute MI Revascularization;  Surgeon: [name], MD;  Location: [place] [facility];  Service: Cardiovascular;  Laterality: N/A;   ICD IMPLANT N/A 11/28/2019   Procedure: ICD IMPLANT;  Surgeon: Deboraha Sprang, MD;  Location: [place] [facility];  Service: Cardiovascular;  Laterality: N/A;   LEFT HEART CATH AND CORONARY ANGIOGRAPHY N/A 08/28/2019   Procedure: LEFT HEART CATH AND CORONARY ANGIOGRAPHY;  Surgeon: [name], MD;  Location: [place] [facility];  Service: Cardiovascular;  Laterality: N/A;    Family Psychiatric History: Notes that his mother and aunt had mental health issues. Notes he belives they had anxiety  Family History:  Family History  Problem Relation Age of Onset   Arrhythmia Father  Social History:  Social History   Socioeconomic History   Marital status: Married    Spouse name: Not on file   Number of children: Not on file   Years of education: Not on file   Highest education level: Not on file  Occupational History   Not on file  Tobacco Use   Smoking status: Every Day    Packs/day: 0.50    Types: Cigarettes    Last attempt to quit: 09/05/2019    Years since quitting: 0.9   Smokeless  tobacco: Never   Tobacco comments:    x15 yrs as of 2021  Vaping Use   Vaping Use: Never used  Substance and Sexual Activity   Alcohol use: Not Currently    Comment: previous use but has not drank in months   Drug use: Never   Sexual activity: Yes    Birth control/protection: None  Other Topics Concern   Not on file  Social History Narrative   Not on file   Social Determinants of Health   Financial Resource Strain: Not on file  Food Insecurity: Not on file  Transportation Needs: Not on file  Physical Activity: Not on file  Stress: Not on file  Social Connections: Not on file    Allergies: No Known Allergies  Metabolic Disorder Labs: Lab Results  Component Value Date   HGBA1C 5.8 (H) 08/28/2019   MPG 119.76 08/28/2019   No results found for: PROLACTIN Lab Results  Component Value Date   CHOL 143 12/13/2019   TRIG 196 (H) 12/13/2019   HDL 37 (L) 12/13/2019   CHOLHDL 3.9 12/13/2019   VLDL 39 12/13/2019   LDLCALC 67 12/13/2019   [place] 64 11/17/2019   Lab Results  Component Value Date   TSH 3.181 08/28/2019    Therapeutic Level Labs: No results found for: LITHIUM No results found for: VALPROATE No components found for:  CBMZ  Current Medications: Current Outpatient Medications  Medication Sig Dispense Refill   atorvastatin (LIPITOR) 80 MG tablet Take 1 tab by mouth daily. 30 tablet 4   acetaminophen (TYLENOL) 500 MG tablet Take 500 mg by mouth every 6 (six) hours as needed for headache.     carvedilol (COREG) 12.5 MG tablet TAKE 1 TABLET (12.5 MG TOTAL) BY MOUTH 2 (TWO) TIMES DAILY WITH A MEAL. 60 tablet 11   dapagliflozin propanediol (FARXIGA) 10 MG TABS tablet TAKE 1 TABLET (10 MG TOTAL) BY MOUTH DAILY BEFORE BREAKFAST. 30 tablet 11   digoxin (LANOXIN) 0.125 MG tablet TAKE 1 TABLET (0.125 MG TOTAL) BY MOUTH DAILY. 90 tablet 3   furosemide (LASIX) 20 MG tablet Take 1 tablet (20 mg total) by mouth daily. 90 tablet 3   hydrOXYzine (ATARAX/VISTARIL) 10 MG  tablet TAKE 1 TABLET (10 MG TOTAL) BY MOUTH 3 (THREE) TIMES DAILY AS NEEDED. 90 tablet 3   mirtazapine (REMERON) 45 MG tablet TAKE 1 TABLET (45 MG TOTAL) BY MOUTH AT BEDTIME. 30 tablet 3   Multiple Vitamin (MULTIVITAMIN WITH MINERALS) TABS tablet Take 1 tablet by mouth daily.     omega-3 acid ethyl esters (LOVAZA) 1 g capsule TAKE 2 CAPSULES (2 G TOTAL) BY MOUTH TWO TIMES DAILY. 120 capsule 3   omeprazole (PRILOSEC) 20 MG capsule Take 20 mg by mouth as needed.     sacubitril-valsartan (ENTRESTO) 49-51 MG TAKE 1 TABLET BY MOUTH 2 (TWO) TIMES DAILY. 180 tablet 3   sertraline (ZOLOFT) 100 MG tablet TAKE 1.5 TABLETS (150 MG TOTAL) BY MOUTH DAILY. [place]  tablet 3   SILDENAFIL CITRATE PO Take 1-2 tablets by mouth daily as needed (ED). 30 mg tablet - take 1 - 2 tablets 30 to 45 min before sexual activity as needed     spironolactone (ALDACTONE) 25 MG tablet TAKE 1 TABLET (25 MG TOTAL) BY MOUTH DAILY. 30 tablet 11   warfarin (COUMADIN) 5 MG tablet TAKE 1 AND 1/2 TABLETS TO 2 TABLETS BY MOUTH DAILY AS DIRECTED BY THE COUMADIN CLINIC. 55 tablet 1   No current facility-administered medications for this visit.     Musculoskeletal: Strength & Muscle Tone:  Unable to assess due to telephonevisits Gait & Station:  Unable to assess due to telephone visits Patient leans: N/A  Psychiatric Specialty Exam: Review of Systems  There were no vitals taken for this visit.There is no height or weight on file to calculate BMI.  General Appearance:  Unable to assess due to telephone visit  Eye Contact:   Unable to assess due to telephone visit  Speech:  Clear and Coherent and Normal Rate  Volume:  Normal  Mood:  Euthymic  Affect:  Appropriate and Congruent  Thought Process:  Coherent, Goal Directed and Linear  Orientation:  Full (Time, Place, and Person)  Thought Content: WDL and Logical   Suicidal Thoughts:  No  Homicidal Thoughts:  No  Memory:  Immediate;   Good Recent;   Good Remote;   Good  Judgement:  Good   Insight:  Good  Psychomotor Activity:   Unable to assess due to telephone visit  Concentration:  Concentration: Good and Attention Span: Good  Recall:  Good  Fund of Knowledge: Good  Language: Good  Akathisia:   Unable to assess due to telephone visit  Handed:  Right  AIMS (if indicated): Not done  Assets:  Communication Skills Desire for Improvement Financial Resources/Insurance Housing Intimacy Social Support  ADL's:  Intact  Cognition: WNL  Sleep:  Good   Screenings: AIMS    Flowsheet Row Admission (Discharged) from 04/26/2014 in [facility]  AIMS Total Score 0      AUDIT    Flowsheet Row Admission (Discharged) from 04/26/2014 in [facility]  Alcohol Use Disorder Identification Test Final Score (AUDIT) 19      GAD-7    Flowsheet Row [facility] Visit from 08/22/2020 in [facility] Video Visit from 05/23/2020 in [facility] Video Visit from 02/23/2020 in [facility] Video Visit from 11/23/2019 in [facility] Counselor from 11/04/2019 in [facility]  Total GAD-7 Score '3 8 9 18 13      '$ PHQ2-9    Flowsheet Row Video Visit from 08/22/2020 in [facility] Video Visit from 05/23/2020 in [facility] Video Visit from 02/23/2020 in [facility] Video Visit from 11/23/2019 in [facility] Counselor from 11/04/2019 in [place]  PHQ-2 Total Score 0 '2 2 5 6  '$ PHQ-9 Total Score '3 7 6 15 16      '$ Flowsheet Row Video Visit from 05/23/2020 in [place] No Risk        Assessment and Plan: Patient notes that he is doing well on his current medication regimen.  No medication changes made  today.  Patient agreeable to continue medications as prescribed.    1. Generalized anxiety  disorder  Continue- hydrOXYzine (ATARAX/VISTARIL) 10 MG tablet; TAKE 1 TABLET (10 MG TOTAL) BY MOUTH 3 (THREE) TIMES DAILY AS NEEDED.  Dispense: 90 tablet; Refill: 3 Continue- mirtazapine (REMERON) 45 MG tablet; Take 1 tablet (45 mg total) by mouth at bedtime.  Dispense: 30 tablet; Refill: 3 Continue- sertraline (ZOLOFT) 100 MG tablet; Take 1.5 tablets (150 mg total) by mouth daily.  Dispense: 45 tablet; Refill: 3  2. Moderate episode of recurrent major depressive disorder (HCC)  Continue- mirtazapine (REMERON) 45 MG tablet; Take 1 tablet (45 mg total) by mouth at bedtime.  Dispense: 30 tablet; Refill: 3 Continue- sertraline (ZOLOFT) 100 MG tablet; Take 1.5 tablets (150 mg total) by mouth daily.  Dispense: 45 tablet; Refill: 3  Follow up in 3 months Follow up with therapy  Salley Slaughter, NP 08/22/2020, 10:56 AM

## 2020-08-23 ENCOUNTER — Other Ambulatory Visit: Payer: Self-pay

## 2020-08-28 ENCOUNTER — Ambulatory Visit (INDEPENDENT_AMBULATORY_CARE_PROVIDER_SITE_OTHER): Payer: Medicaid Other

## 2020-08-28 DIAGNOSIS — I255 Ischemic cardiomyopathy: Secondary | ICD-10-CM | POA: Diagnosis not present

## 2020-08-28 LAB — CUP PACEART REMOTE DEVICE CHECK
Battery Remaining Longevity: 174 mo
Battery Remaining Percentage: 100 %
Brady Statistic RV Percent Paced: 0 %
Date Time Interrogation Session: 20220802020600
HighPow Impedance: 77 Ohm
Implantable Lead Implant Date: 20211101
Implantable Lead Location: 753860
Implantable Lead Model: 138
Implantable Lead Serial Number: 303500
Implantable Pulse Generator Implant Date: 20211101
Lead Channel Impedance Value: 512 Ohm
Lead Channel Setting Pacing Amplitude: 3.5 V
Lead Channel Setting Pacing Pulse Width: 0.4 ms
Lead Channel Setting Sensing Sensitivity: 0.5 mV
Pulse Gen Serial Number: 209902

## 2020-08-29 ENCOUNTER — Other Ambulatory Visit: Payer: Self-pay

## 2020-08-31 ENCOUNTER — Other Ambulatory Visit: Payer: Self-pay

## 2020-09-04 ENCOUNTER — Other Ambulatory Visit: Payer: Self-pay

## 2020-09-04 MED FILL — Dapagliflozin Propanediol Tab 10 MG (Base Equivalent): ORAL | 30 days supply | Qty: 30 | Fill #4 | Status: AC

## 2020-09-10 ENCOUNTER — Other Ambulatory Visit: Payer: Self-pay

## 2020-09-10 MED FILL — Sacubitril-Valsartan Tab 49-51 MG: ORAL | 30 days supply | Qty: 60 | Fill #4 | Status: AC

## 2020-09-10 MED FILL — Digoxin Tab 125 MCG (0.125 MG): ORAL | 90 days supply | Qty: 90 | Fill #1 | Status: AC

## 2020-09-11 ENCOUNTER — Other Ambulatory Visit: Payer: Self-pay

## 2020-09-12 ENCOUNTER — Telehealth: Payer: Self-pay | Admitting: *Deleted

## 2020-09-12 NOTE — Telephone Encounter (Signed)
Called pt since he is overdue for Anticoagulation Appt; left a message for the pt to call back.

## 2020-09-17 ENCOUNTER — Encounter (HOSPITAL_COMMUNITY): Payer: Self-pay | Admitting: Cardiology

## 2020-09-17 ENCOUNTER — Other Ambulatory Visit: Payer: Self-pay

## 2020-09-17 ENCOUNTER — Ambulatory Visit (HOSPITAL_BASED_OUTPATIENT_CLINIC_OR_DEPARTMENT_OTHER)
Admission: RE | Admit: 2020-09-17 | Discharge: 2020-09-17 | Disposition: A | Discharge status: Home / Self Care | Payer: Medicaid Other | Source: Ambulatory Visit | Attending: Cardiology | Admitting: Cardiology

## 2020-09-17 ENCOUNTER — Encounter: Payer: Self-pay | Admitting: *Deleted

## 2020-09-17 ENCOUNTER — Ambulatory Visit (HOSPITAL_COMMUNITY)
Admission: RE | Admit: 2020-09-17 | Discharge: 2020-09-17 | Disposition: A | Discharge status: Home / Self Care | Payer: Medicaid Other | Source: Ambulatory Visit | Attending: Cardiology | Admitting: Cardiology

## 2020-09-17 VITALS — BP 98/62 | HR 72 | Wt 193.6 lb

## 2020-09-17 DIAGNOSIS — I2510 Atherosclerotic heart disease of native coronary artery without angina pectoris: Secondary | ICD-10-CM | POA: Insufficient documentation

## 2020-09-17 DIAGNOSIS — Z006 Encounter for examination for normal comparison and control in clinical research program: Secondary | ICD-10-CM

## 2020-09-17 DIAGNOSIS — Z95810 Presence of automatic (implantable) cardiac defibrillator: Secondary | ICD-10-CM | POA: Insufficient documentation

## 2020-09-17 DIAGNOSIS — Z7901 Long term (current) use of anticoagulants: Secondary | ICD-10-CM | POA: Insufficient documentation

## 2020-09-17 DIAGNOSIS — I5022 Chronic systolic (congestive) heart failure: Secondary | ICD-10-CM

## 2020-09-17 DIAGNOSIS — F17210 Nicotine dependence, cigarettes, uncomplicated: Secondary | ICD-10-CM | POA: Diagnosis not present

## 2020-09-17 DIAGNOSIS — Z7984 Long term (current) use of oral hypoglycemic drugs: Secondary | ICD-10-CM | POA: Insufficient documentation

## 2020-09-17 DIAGNOSIS — E785 Hyperlipidemia, unspecified: Secondary | ICD-10-CM | POA: Diagnosis not present

## 2020-09-17 DIAGNOSIS — I700 Atherosclerosis of aorta: Secondary | ICD-10-CM | POA: Diagnosis not present

## 2020-09-17 DIAGNOSIS — I252 Old myocardial infarction: Secondary | ICD-10-CM | POA: Insufficient documentation

## 2020-09-17 DIAGNOSIS — I255 Ischemic cardiomyopathy: Secondary | ICD-10-CM | POA: Insufficient documentation

## 2020-09-17 DIAGNOSIS — I340 Nonrheumatic mitral (valve) insufficiency: Secondary | ICD-10-CM | POA: Insufficient documentation

## 2020-09-17 DIAGNOSIS — Z79899 Other long term (current) drug therapy: Secondary | ICD-10-CM | POA: Insufficient documentation

## 2020-09-17 DIAGNOSIS — E782 Mixed hyperlipidemia: Secondary | ICD-10-CM

## 2020-09-17 LAB — ECHOCARDIOGRAM COMPLETE
AR max vel: 0.86 cm2
AV Area VTI: 0.83 cm2
AV Area mean vel: 0.82 cm2
AV Mean grad: 28 mmHg
AV Peak grad: 41.5 mmHg
Ao pk vel: 3.22 m/s
Area-P 1/2: 4.29 cm2
Calc EF: 38.3 %
S' Lateral: 4 cm
Single Plane A2C EF: 41.4 %
Single Plane A4C EF: 36.7 %

## 2020-09-17 LAB — BASIC METABOLIC PANEL
Anion gap: 9 (ref 5–15)
BUN: 20 mg/dL (ref 6–20)
CO2: 25 mmol/L (ref 22–32)
Calcium: 9.4 mg/dL (ref 8.9–10.3)
Chloride: 104 mmol/L (ref 98–111)
Creatinine, Ser: 0.91 mg/dL (ref 0.61–1.24)
GFR, Estimated: >60 mL/min (ref >60)
Glucose, Bld: 99 mg/dL (ref 70–99)
Potassium: 4.3 mmol/L (ref 3.5–5.1)
Sodium: 138 mmol/L (ref 135–145)

## 2020-09-17 LAB — LIPID PANEL
Cholesterol: 165 mg/dL (ref 0–200)
HDL: 31 mg/dL — ABNORMAL LOW (ref >40)
LDL Cholesterol: 65 mg/dL (ref 0–99)
Total CHOL/HDL Ratio: 5.3 RATIO
Triglycerides: 343 mg/dL — ABNORMAL HIGH (ref <150)
VLDL: 69 mg/dL — ABNORMAL HIGH (ref 0–40)

## 2020-09-17 LAB — DIGOXIN LEVEL: Digoxin Level: 1 ng/mL (ref 0.8–2.0)

## 2020-09-17 MED ORDER — VARENICLINE TARTRATE 0.5 MG PO TABS
0.5000 mg | ORAL_TABLET | Freq: Two times a day (BID) | ORAL | 3 refills | Status: DC
  Filled 2020-09-17: qty 56, 28d supply, fill #0
  Filled 2020-09-17: qty 168, 84d supply, fill #0
  Filled 2020-12-16: qty 168, 84d supply, fill #1
  Filled 2021-03-10: qty 168, 84d supply, fill #2
  Filled 2021-03-11: qty 24, 12d supply, fill #0
  Filled 2021-03-17: qty 24, 12d supply, fill #1
  Filled 2021-03-20: qty 56, 28d supply, fill #1
  Filled 2021-03-21 – 2021-03-24 (×2): qty 24, 12d supply, fill #1

## 2020-09-17 MED ORDER — PERFLUTREN LIPID MICROSPHERE
1.0000 mL | INTRAVENOUS | Status: DC | PRN
  Administered 2020-09-17: 2 mL via INTRAVENOUS
  Filled 2020-09-17: qty 10

## 2020-09-17 MED FILL — Carvedilol Tab 12.5 MG: ORAL | 30 days supply | Qty: 60 | Fill #4 | Status: AC

## 2020-09-17 MED FILL — Spironolactone Tab 25 MG: ORAL | 30 days supply | Qty: 30 | Fill #4 | Status: AC

## 2020-09-17 NOTE — Patient Instructions (Addendum)
Labs done today. We will contact you only if your labs are abnormal.  START Chantix 0.'5mg'$  (1 tablet) by mouth daily for 3 days, then INCREASE to 1 tablet by mouth 2 times daily for the next 3 days then INCREASE to 2 tablets by mouth 2 times daily.   No other medication changes were made. Please continue all current medications as prescribed.  Your physician recommends that you schedule a follow-up appointment in: 3 months  If you have any questions or concerns before your next appointment please send [place] a message through [place] or call our office at [phone].    TO LEAVE A MESSAGE FOR THE NURSE SELECT OPTION 2, PLEASE LEAVE A MESSAGE INCLUDING: YOUR NAME DATE OF BIRTH CALL BACK NUMBER REASON FOR CALL**this is important as we prioritize the call backs  YOU WILL RECEIVE A CALL BACK THE SAME DAY AS LONG AS YOU CALL BEFORE 4:00 PM   Do the following things EVERYDAY: Weigh yourself in the morning before breakfast. Write it down and keep it in a log. Take your medicines as prescribed Eat low salt foods--Limit salt (sodium) to 2000 mg per day.  Stay as active as you can everyday Limit all fluids for the day to less than 2 liters   At the [facility], you and your health needs are our priority. As part of our continuing mission to provide you with exceptional heart care, we have created designated Provider Care Teams. These Care Teams include your primary Cardiologist (physician) and Advanced Practice Providers (APPs- Physician Assistants and Nurse Practitioners) who all work together to provide you with the care you need, when you need it.   You may see any of the following providers on your designated Care Team at your next follow up: Dr Glori Bickers Dr Haynes Kerns, NP Lyda Jester, [place] Audry Riles, PharmD   Please be sure to bring in all your medications bottles to every appointment.

## 2020-09-17 NOTE — Progress Notes (Signed)
  Echocardiogram 2D Echocardiogram has been performed.  Michiel Cowboy 09/17/2020, 9:47 AM

## 2020-09-17 NOTE — Progress Notes (Signed)
Advanced Heart Failure Clinic Note   PCP: Pcp, [name] Cardiology: Dr. Aundra Dubin  Reason for Visit: Heart Failure    HPI: 55 y.o. male smoker was admitted on 08/28/19 for delayed presentation anterior STEMI. Had had >36 hr of chest pain prior to seeking medical attention. Pain progressed to more pleuritic like CP. Found to have anterior ST elevations w/ precordial Q waves on admit. Hs troponin >27,000. Urgent cardiac cath showed totally occluded mid LAD w/o collaterals and 40% mid RCA stenosis. It was suspected he had completed his infarct and residual pleuritic CP was post-MI pericarditis. No intervention was performed. 2D echo demonstrated moderately reduced LVEF, 45-50%, + apical thrombus and moderate AS. MV normal. RV systolic function mildly reduced.    He was placed on medical management for CAD and systolic HF. He was started on ASA, atorvastatin, Losartan and Coreg. Coumadin started for apical thrombus w/ heparin bridge.   Post cath, there were initial concerns for developing cardiogenic shock but he remained stable and did not require pressor/inotropic support. Co-ox remained stable. Repeat echo showed further reduction of LVEF down to 30-35% but no post MI mechanical complications. RV mildly reduced. He did require IV Lasix for pulmonary edema and volume status and dyspnea improved w/ diuresis. He was continued on GTDMT w/ losartan, spironolactone and Coreg. BP too soft for Entresto. He was treated w/ colchicine for post MI pericarditis w/ improvement in pleuritic chest pain. Given anterior MI w/ EF <35%, he was fitted w/ a LifeVest prior to discharge w/ plans to get repeat echo in 1 month.   He had repeat echo in 9/21, showing EF 25-30%.  He had [place] placed in 11/21.  Echo was done today and reviewed, EF 30% with no LV thrombus, normal RV, normal IVC, moderate AS with mean gradient 28 mmHg and AVA 1.08 cm^2. Marland Kitchen   He returns for followup of CHF and CAD. Weight is up 7 lbs.   Still smoking but trying to cut back. Taking Lasix 20 mg daily.  Stable dyspnea with stairs/hills but able to walk for 40 minutes on flat ground without problems.  No chest pain.  Occasional lightheadedness with fast standing but no syncope/falls. No orthopnea/PND.  +Fatigue/tiredness.   Labs (9/21): K 4.3, creatinine 0.93 Labs (11/21): LDL 67, HDL 37, K 4.4, creatinine 0.81 Labs (1/22): digoxin 0.5, K 4, creatinine 1.1.  Labs (5/22): digoxin 0.9, K 4.4, creatinine 1.06  [employer] Device interrogation: Heartlogic score 0  PMH: 1. GERD 2. Depression  3. CAD: Anterior STEMI in 8/21 with totally occluded LAD/no collaterals, 40% mid RCA.  Due to late presentation, no PCI was done.  - Had post-infarct pericarditis.  4. Chronic systolic CHF: Ischemic cardiomyopathy. [place].  - Echo (9/21) with EF 25-30%, normal RV, moderate AS with mean gradient 22 mmHg.  - Echo (8/22): EF 30% with no LV thrombus, normal RV, normal IVC, moderate AS with mean gradient 28 mmHg and AVA 1.08 cm^2.  5. LV thrombus: Apical thrombus noted on echo at time of MI in 8/21. Resolved by 9/21 echo.  6. Aortic stenosis: Moderate on 9/21 echo.  - Moderate on 8/22 echo.  7. Active smoker.   Current Outpatient Medications  Medication Sig Dispense Refill   acetaminophen (TYLENOL) 500 MG tablet Take 500 mg by mouth every 6 (six) hours as needed for headache.     atorvastatin (LIPITOR) 80 MG tablet Take 1 tab by mouth daily. 30 tablet 4   carvedilol (COREG)  12.5 MG tablet TAKE 1 TABLET (12.5 MG TOTAL) BY MOUTH 2 (TWO) TIMES DAILY WITH A MEAL. 60 tablet 11   dapagliflozin propanediol (FARXIGA) 10 MG TABS tablet TAKE 1 TABLET (10 MG TOTAL) BY MOUTH DAILY BEFORE BREAKFAST. 30 tablet 11   digoxin (LANOXIN) 0.125 MG tablet TAKE 1 TABLET (0.125 MG TOTAL) BY MOUTH DAILY. 90 tablet 3   furosemide (LASIX) 20 MG tablet Take 1 tablet (20 mg total) by mouth daily. 90 tablet 3   hydrOXYzine (ATARAX/VISTARIL) 10 MG  tablet TAKE 1 TABLET (10 MG TOTAL) BY MOUTH 3 (THREE) TIMES DAILY AS NEEDED. 90 tablet 3   Multiple Vitamin (MULTIVITAMIN WITH MINERALS) TABS tablet Take 1 tablet by mouth daily.     omega-3 acid ethyl esters (LOVAZA) 1 g capsule TAKE 2 CAPSULES (2 G TOTAL) BY MOUTH TWO TIMES DAILY. 120 capsule 3   omeprazole (PRILOSEC) 20 MG capsule Take 20 mg by mouth as needed.     sacubitril-valsartan (ENTRESTO) 49-51 MG TAKE 1 TABLET BY MOUTH 2 (TWO) TIMES DAILY. 180 tablet 3   sertraline (ZOLOFT) 100 MG tablet TAKE 1.5 TABLETS (150 MG TOTAL) BY MOUTH DAILY. 45 tablet 3   SILDENAFIL CITRATE PO Take 1-2 tablets by mouth daily as needed (ED). 30 mg tablet - take 1 - 2 tablets 30 to 45 min before sexual activity as needed     spironolactone (ALDACTONE) 25 MG tablet TAKE 1 TABLET (25 MG TOTAL) BY MOUTH DAILY. 30 tablet 11   varenicline (CHANTIX) 0.5 MG tablet Take 1 tablet (0.5 mg total) by mouth 2 (two) times daily. 180 tablet 3   warfarin (COUMADIN) 5 MG tablet TAKE 1 AND 1/2 TABLETS TO 2 TABLETS BY MOUTH DAILY AS DIRECTED BY THE COUMADIN CLINIC. 55 tablet 1   No current facility-administered medications for this encounter.    No Known Allergies    Social History   Socioeconomic History   Marital status: Married    Spouse name: Not on file   Number of children: Not on file   Years of education: Not on file   Highest education level: Not on file  Occupational History   Not on file  Tobacco Use   Smoking status: Every Day    Packs/day: 0.50    Types: Cigarettes    Last attempt to quit: 09/05/2019    Years since quitting: 1.0   Smokeless tobacco: Never   Tobacco comments:    x15 yrs as of 2021  Vaping Use   Vaping Use: Never used  Substance and Sexual Activity   Alcohol use: Not Currently    Comment: previous use but has not drank in months   Drug use: Never   Sexual activity: Yes    Birth control/protection: None  Other Topics Concern   Not on file  Social History Narrative   Not on file    Social Determinants of Health   Financial Resource Strain: Not on file  Food Insecurity: Not on file  Transportation Needs: Not on file  Physical Activity: Not on file  Stress: Not on file  Social Connections: Not on file  Intimate Partner Violence: Not on file      Family History  Problem Relation Age of Onset   Arrhythmia Father     Vitals:   09/17/20 0954  BP: 98/62  Pulse: 72  SpO2: 96%  Weight: 87.8 kg (193 lb 9.6 oz)   Wt Readings from Last 3 Encounters:  09/17/20 87.8 kg (193 lb 9.6 oz)  06/12/20 84.6 kg (186 lb 9.6 oz)  04/04/20 83 kg (183 lb)    PHYSICAL EXAM: General: NAD Neck: No JVD, no thyromegaly or thyroid nodule.  Lungs: Clear to auscultation bilaterally with normal respiratory effort. CV: Nondisplaced PMI.  Heart regular S1/S2, no S3/S4, 2/6 early SEM RUSB with clear S2.  No peripheral edema.  No carotid bruit.  Normal pedal pulses.  Abdomen: Soft, nontender, no hepatosplenomegaly, no distention.  Skin: Intact without lesions or rashes.  Neurologic: Alert and oriented x 3.  Psych: Normal affect. Extremities: No clubbing or cyanosis.  HEENT: Normal.    ASSESSMENT & PLAN: 1. CAD: Admission 8/21 for acute anterior MI, delayed presentation with no ischemic pain, had pleuritic-type chest pain (post-MI pericarditis). LHC with totally occluded mid LAD w/o collaterals.  Suspected completed MI, no intervention. Plan medical management. No ischemic chest pain.  - No ASA given warfarin use.  - Continue atorvastatin 80 mg daily, check lipids today.  2. Chronic systolic CHF: Ischemic Cardiomyopathy.  Reduced EF post anterior MI.  Echo in 9/21 with EF 25-30%, peri-apical akinesis, moderate AS. Cath w/ occluded mLAD but no intervention as he had completed his infarct. He has [place].  Currently NYHA class II-III, not volume overloaded on exam or by Heartlogic.  - Continue Entresto 49/51 bid, no BP room to increase.  - Continue spironolactone 25 mg  daily. BMET today.  - Continue Coreg 12.5 mg bid, no BP room to increase.   - Continue dapagliflozin 10 mg daily.  - Continue digoxin 0.125, check level today.   - Continue Lasix 20 mg daily.  - I will have him evaluated for baroreceptor activation therapy.  3. Post MI Pericarditis: Resolved.  4. LV thrombus: Large LV thrombus noted on echo in 8/21, resolved on 9/21 echo.   - Continue warfarin.  5. H/o Tobacco Abuse: Still smoking, needs to quit. Dyspnea may be related to COPD rather than CHF.  - I will give him a prescription for Chantix.  6. HLD: Continue atorvastatin, check lipids today.   Followup 3 months   Loralie Champagne, MD  09/17/2020

## 2020-09-18 ENCOUNTER — Other Ambulatory Visit: Payer: Self-pay

## 2020-09-19 ENCOUNTER — Other Ambulatory Visit: Payer: Self-pay

## 2020-09-19 ENCOUNTER — Other Ambulatory Visit (HOSPITAL_COMMUNITY): Payer: Self-pay | Admitting: Psychiatry

## 2020-09-19 DIAGNOSIS — F331 Major depressive disorder, recurrent, moderate: Secondary | ICD-10-CM

## 2020-09-19 DIAGNOSIS — F411 Generalized anxiety disorder: Secondary | ICD-10-CM

## 2020-09-19 MED ORDER — MIRTAZAPINE 45 MG PO TABS
ORAL_TABLET | ORAL | 3 refills | Status: DC
  Filled 2020-09-19: qty 30, 30d supply, fill #0
  Filled 2020-10-15: qty 30, 30d supply, fill #1
  Filled 2020-11-19: qty 30, 30d supply, fill #2

## 2020-09-20 ENCOUNTER — Telehealth: Payer: Self-pay | Admitting: *Deleted

## 2020-09-20 NOTE — Telephone Encounter (Signed)
Called pt since he is overdue for Anticoagulation Appt; left a message for the pt to call back. Will await a response from the pt.

## 2020-09-20 NOTE — Research (Signed)
Spoke with patient about [employer] study. Gave patient information and answered his questions.  Patient to review and contact me if he would like to move forward.

## 2020-09-22 NOTE — Progress Notes (Signed)
Remote ICD transmission.   

## 2020-09-24 ENCOUNTER — Other Ambulatory Visit: Payer: Self-pay

## 2020-09-25 ENCOUNTER — Telehealth: Payer: Self-pay

## 2020-09-27 ENCOUNTER — Other Ambulatory Visit: Payer: Self-pay

## 2020-09-27 ENCOUNTER — Ambulatory Visit (INDEPENDENT_AMBULATORY_CARE_PROVIDER_SITE_OTHER): Payer: Medicaid Other

## 2020-09-27 DIAGNOSIS — Z7901 Long term (current) use of anticoagulants: Secondary | ICD-10-CM | POA: Diagnosis not present

## 2020-09-27 DIAGNOSIS — Z5181 Encounter for therapeutic drug level monitoring: Secondary | ICD-10-CM

## 2020-09-27 DIAGNOSIS — I513 Intracardiac thrombosis, not elsewhere classified: Secondary | ICD-10-CM | POA: Diagnosis not present

## 2020-09-27 LAB — POCT INR: INR: 2.6 (ref 2.0–3.0)

## 2020-09-27 NOTE — Patient Instructions (Signed)
Description   Continue same dosage of Warfarin 1.5 tablets daily except for 2 tablets on Sundays, Tuesdays and Thursdays. Be consistent with your 2 servings of greens. Recheck INR in 6 weeks. Call coumadin clinic for any changes in medications or upcoming procedures. [phone].   [place]LL [id number]

## 2020-10-03 ENCOUNTER — Other Ambulatory Visit: Payer: Self-pay

## 2020-10-03 MED FILL — Dapagliflozin Propanediol Tab 10 MG (Base Equivalent): ORAL | 30 days supply | Qty: 30 | Fill #5 | Status: AC

## 2020-10-05 ENCOUNTER — Other Ambulatory Visit: Payer: Self-pay

## 2020-10-08 ENCOUNTER — Telehealth (HOSPITAL_COMMUNITY): Payer: Self-pay | Admitting: Pharmacy Technician

## 2020-10-08 ENCOUNTER — Telehealth (HOSPITAL_COMMUNITY): Payer: Self-pay | Admitting: *Deleted

## 2020-10-08 ENCOUNTER — Other Ambulatory Visit: Payer: Self-pay

## 2020-10-08 ENCOUNTER — Other Ambulatory Visit (HOSPITAL_COMMUNITY): Payer: Self-pay

## 2020-10-08 MED ORDER — DIGOXIN 125 MCG PO TABS: 0.0625 mg | ORAL_TABLET | Freq: Every day | ORAL | 3 refills | Status: DC

## 2020-10-08 MED ORDER — ICOSAPENT ETHYL 1 G PO CAPS
2.0000 g | ORAL_CAPSULE | Freq: Two times a day (BID) | ORAL | 3 refills | Status: DC
  Filled 2020-10-08 – 2020-10-11 (×3): qty 120, 30d supply, fill #0

## 2020-10-08 NOTE — Telephone Encounter (Signed)
Harvie Junior, [facility]  10/08/2020  9:21 AM EDT Back to Top    Pt returned call and verbalized understanding.    Harvie Junior, [place]  10/05/2020  4:31 PM EDT     Pt left vm returning call. I called pt back and left detailed vm asked for return call before I send medications to his pharmacy.   Scarlette Calico, RN  10/04/2020  5:14 PM EDT     Left message to call back, mychart message sent   Shonna Chock, [place]  09/20/2020  4:30 PM EDT     [facility]   Philicia Logan Bores, CMA  09/18/2020 12:15 PM EDT     [facility]   [name], MD  09/17/2020 10:42 PM EDT     Decrease digoxin to 0.0625 mg daily.  With elevated triglycerides, start Vascepa 2 g bid with lipids in 2 months.

## 2020-10-08 NOTE — Telephone Encounter (Signed)
-----   Message from [name], MD sent at 09/17/2020 10:42 PM EDT ----- Decrease digoxin to 0.0625 mg daily.  With elevated triglycerides, start Vascepa 2 g bid with lipids in 2 months.

## 2020-10-08 NOTE — Telephone Encounter (Signed)
Advanced Heart Failure Patient Advocate Encounter  Received a request for Vascepa PA. Upon further investigation, patient's insurance requires Fenofibrate. Sent message to confirm change and dosing.

## 2020-10-10 ENCOUNTER — Other Ambulatory Visit: Payer: Self-pay

## 2020-10-10 MED FILL — Sacubitril-Valsartan Tab 49-51 MG: ORAL | 30 days supply | Qty: 60 | Fill #5 | Status: AC

## 2020-10-11 ENCOUNTER — Encounter (HOSPITAL_COMMUNITY): Payer: Self-pay

## 2020-10-11 ENCOUNTER — Other Ambulatory Visit: Payer: Self-pay

## 2020-10-15 ENCOUNTER — Other Ambulatory Visit: Payer: Self-pay

## 2020-10-15 MED FILL — Spironolactone Tab 25 MG: ORAL | 30 days supply | Qty: 30 | Fill #5 | Status: AC

## 2020-10-15 MED FILL — Carvedilol Tab 12.5 MG: ORAL | 30 days supply | Qty: 60 | Fill #5 | Status: AC

## 2020-10-16 ENCOUNTER — Other Ambulatory Visit: Payer: Self-pay

## 2020-10-16 ENCOUNTER — Other Ambulatory Visit (HOSPITAL_COMMUNITY): Payer: Self-pay | Admitting: *Deleted

## 2020-10-16 MED ORDER — FENOFIBRATE 145 MG PO TABS
145.0000 mg | ORAL_TABLET | Freq: Every day | ORAL | 3 refills | Status: DC
  Filled 2020-10-16: qty 30, 30d supply, fill #0
  Filled 2020-11-13: qty 30, 30d supply, fill #1
  Filled 2020-12-10: qty 30, 30d supply, fill #2
  Filled 2021-01-06: qty 30, 30d supply, fill #3

## 2020-10-17 ENCOUNTER — Other Ambulatory Visit: Payer: Self-pay

## 2020-10-23 NOTE — Telephone Encounter (Signed)
Called pt, left message.

## 2020-10-29 ENCOUNTER — Other Ambulatory Visit: Payer: Self-pay | Admitting: Cardiology

## 2020-10-29 ENCOUNTER — Other Ambulatory Visit: Payer: Self-pay

## 2020-10-29 MED ORDER — WARFARIN SODIUM 5 MG PO TABS
ORAL_TABLET | ORAL | 1 refills | Status: DC
  Filled 2020-10-29: qty 55, 28d supply, fill #0
  Filled 2020-12-02: qty 55, 28d supply, fill #1

## 2020-10-29 NOTE — Telephone Encounter (Signed)
Prescription refill request received for warfarin Lov:  mclean, 09/17/2020 Next INR check: 11/08/2020 Warfarin tablet strength:  5mg 

## 2020-10-30 ENCOUNTER — Other Ambulatory Visit: Payer: Self-pay

## 2020-11-02 ENCOUNTER — Other Ambulatory Visit: Payer: Self-pay | Admitting: Cardiology

## 2020-11-02 ENCOUNTER — Other Ambulatory Visit: Payer: Self-pay

## 2020-11-02 MED ORDER — OMEGA-3-ACID ETHYL ESTERS 1 G PO CAPS
ORAL_CAPSULE | ORAL | 3 refills | Status: DC
Start: 2020-11-02 — End: 2021-03-03
  Filled 2020-11-02: qty 120, 30d supply, fill #0
  Filled 2020-12-02: qty 120, 30d supply, fill #1
  Filled 2020-12-31: qty 120, 30d supply, fill #2
  Filled 2021-02-03: qty 120, 30d supply, fill #3
  Filled 2021-02-04: qty 120, 30d supply, fill #0

## 2020-11-02 MED FILL — Sacubitril-Valsartan Tab 49-51 MG: ORAL | 30 days supply | Qty: 60 | Fill #6 | Status: CN

## 2020-11-05 ENCOUNTER — Other Ambulatory Visit: Payer: Self-pay

## 2020-11-06 ENCOUNTER — Other Ambulatory Visit: Payer: Self-pay

## 2020-11-06 MED FILL — Dapagliflozin Propanediol Tab 10 MG (Base Equivalent): ORAL | 30 days supply | Qty: 30 | Fill #6 | Status: AC

## 2020-11-08 ENCOUNTER — Ambulatory Visit (INDEPENDENT_AMBULATORY_CARE_PROVIDER_SITE_OTHER): Payer: Medicaid Other | Admitting: *Deleted

## 2020-11-08 ENCOUNTER — Other Ambulatory Visit: Payer: Self-pay

## 2020-11-08 DIAGNOSIS — I513 Intracardiac thrombosis, not elsewhere classified: Secondary | ICD-10-CM | POA: Diagnosis not present

## 2020-11-08 DIAGNOSIS — Z7901 Long term (current) use of anticoagulants: Secondary | ICD-10-CM

## 2020-11-08 DIAGNOSIS — Z5181 Encounter for therapeutic drug level monitoring: Secondary | ICD-10-CM | POA: Diagnosis not present

## 2020-11-08 LAB — POCT INR: INR: 5.2 — AB (ref 2.0–3.0)

## 2020-11-08 NOTE — Patient Instructions (Signed)
Description   Hold warfarin today and tomorrow. Then start taking warfarin 1.5 tablets daily except for 2 tablets on Sunday. Recheck INR in 2 weeks. Coumadin Clinic [phone].   BILL [id number]

## 2020-11-13 MED FILL — Sacubitril-Valsartan Tab 49-51 MG: ORAL | 30 days supply | Qty: 60 | Fill #6 | Status: CN

## 2020-11-14 ENCOUNTER — Other Ambulatory Visit: Payer: Self-pay

## 2020-11-14 MED FILL — Sacubitril-Valsartan Tab 49-51 MG: ORAL | 30 days supply | Qty: 60 | Fill #6 | Status: CN

## 2020-11-15 ENCOUNTER — Telehealth (HOSPITAL_COMMUNITY): Payer: Self-pay | Admitting: Pharmacy Technician

## 2020-11-15 ENCOUNTER — Other Ambulatory Visit: Payer: Self-pay

## 2020-11-15 ENCOUNTER — Encounter (HOSPITAL_COMMUNITY): Payer: Self-pay

## 2020-11-15 NOTE — Telephone Encounter (Signed)
Patient Advocate Encounter   Received notification from Medicaid that prior authorization for Craig Flores is required.   PA submitted on [employer] [id number] W Status is pending   Will continue to follow.

## 2020-11-16 NOTE — Telephone Encounter (Signed)
Advanced Heart Failure Patient Advocate Encounter  Prior Authorization for Delene Loll has been approved.    PA# [id number] Effective dates: 11/15/20 through 11/15/21  Charlann Boxer, CPhT

## 2020-11-19 ENCOUNTER — Other Ambulatory Visit: Payer: Self-pay

## 2020-11-19 MED FILL — Sacubitril-Valsartan Tab 49-51 MG: ORAL | 30 days supply | Qty: 60 | Fill #6 | Status: AC

## 2020-11-19 MED FILL — Carvedilol Tab 12.5 MG: ORAL | 30 days supply | Qty: 60 | Fill #6 | Status: AC

## 2020-11-19 MED FILL — Spironolactone Tab 25 MG: ORAL | 30 days supply | Qty: 30 | Fill #6 | Status: AC

## 2020-11-20 ENCOUNTER — Other Ambulatory Visit: Payer: Self-pay

## 2020-11-22 ENCOUNTER — Telehealth: Payer: Self-pay | Admitting: *Deleted

## 2020-11-22 NOTE — Telephone Encounter (Signed)
Left message for the pt to call back regarding rescheduling his missed appt

## 2020-11-23 ENCOUNTER — Other Ambulatory Visit: Payer: Self-pay

## 2020-11-23 ENCOUNTER — Encounter (HOSPITAL_COMMUNITY): Payer: Self-pay | Admitting: Psychiatry

## 2020-11-23 ENCOUNTER — Telehealth (INDEPENDENT_AMBULATORY_CARE_PROVIDER_SITE_OTHER): Payer: Medicaid Other | Admitting: Psychiatry

## 2020-11-23 DIAGNOSIS — F411 Generalized anxiety disorder: Secondary | ICD-10-CM

## 2020-11-23 DIAGNOSIS — F331 Major depressive disorder, recurrent, moderate: Secondary | ICD-10-CM | POA: Diagnosis not present

## 2020-11-23 MED ORDER — MIRTAZAPINE 45 MG PO TABS
ORAL_TABLET | ORAL | 3 refills | Status: DC
  Filled 2020-11-23: qty 30, fill #0
  Filled 2020-12-17: qty 30, 30d supply, fill #0
  Filled 2021-01-21: qty 30, 30d supply, fill #1

## 2020-11-23 MED ORDER — HYDROXYZINE HCL 10 MG PO TABS
ORAL_TABLET | Freq: Three times a day (TID) | ORAL | 3 refills | Status: DC | PRN
  Filled 2020-11-23: qty 90, 30d supply, fill #0
  Filled 2021-02-09: qty 90, 30d supply, fill #1
  Filled 2021-02-11: qty 90, 30d supply, fill #0

## 2020-11-23 MED ORDER — SERTRALINE HCL 100 MG PO TABS
ORAL_TABLET | ORAL | 3 refills | Status: DC
  Filled 2020-11-23: qty 45, fill #0
  Filled 2020-12-17: qty 45, 30d supply, fill #0
  Filled 2021-01-21: qty 45, 30d supply, fill #1

## 2020-11-23 NOTE — Progress Notes (Signed)
BH MD/PA/NP OP Progress Note Virtual Visit via Telephone Note  I connected with Craig Flores on 08/22/20 at 10:30 AM EDT by telephone and verified that I am speaking with the correct person using two identifiers.  Location: Patient: home Provider: Clinic   I discussed the limitations, risks, security and privacy concerns of performing an evaluation and management service by telephone and the availability of in person appointments. I also discussed with the patient that there may be a patient responsible charge related to this service. The patient expressed understanding and agreed to proceed.   I provided 30 minutes of non-face-to-face time during this encounter.     08/22/2020 10:56 AM Craig Flores  MRN:  [id number]  Chief Complaint: "I have the flu but things are stable"  HPI:  55 year old male seen today for follow up psychiatric evaluation. He has a psychiatric history of alcohol dependence, substance induced mood disorder, anxiety, depression, and SI. He is currently being managed on Zoloft 150mg  daily,  hydroxyzine 10 mg three times daily as needed, and Mirtazapine 45 mg at nightly. He notes that his medications are effective in managing his psychiatric conditions.   Today he is unable to logon virtually so assessment was done over the phone.  During exam he was pleasant, cooperative, and engaged in conversation. He informed Probation officer that he has been dealing with the flu for a week but notes he feels mentally stable. He endorses minimal anxiety and depression. Provider conducted a GAD 7 and patient scored a 2, at his last visit he scord a 3. Provider also conducted a PHQ-9 and patient scored a 3, at his last visit he scored a 3.  He endorses adequate an increased appetite and notes that he has gained 5 pounds.  Today he denies SI/HI/VAH or paranoia.    No medication changes made today.  Patient agreeable to continue medications as prescribed.  No other concerns noted at this  time.  Visit Diagnosis:    ICD-10-CM   1. Generalized anxiety disorder  F41.1 hydrOXYzine (ATARAX/VISTARIL) 10 MG tablet    mirtazapine (REMERON) 45 MG tablet    sertraline (ZOLOFT) 100 MG tablet    2. Moderate episode of recurrent major depressive disorder (HCC)  F33.1 mirtazapine (REMERON) 45 MG tablet    sertraline (ZOLOFT) 100 MG tablet      Past Psychiatric History: alcohol dependence, substance induced mood disorder, anxiety, depression, and SI.   Past Medical History:  Past Medical History:  Diagnosis Date   Acid reflux    CHF (congestive heart failure) ([place])    Depression    Migraines    Myocardial infarction [facility])     Past Surgical History:  Procedure Laterality Date   CORONARY/GRAFT ACUTE MI REVASCULARIZATION N/A 08/28/2019   Procedure: Coronary/Graft Acute MI Revascularization;  Surgeon: [name], MD;  Location: [place] [facility];  Service: Cardiovascular;  Laterality: N/A;   ICD IMPLANT N/A 11/28/2019   Procedure: ICD IMPLANT;  Surgeon: Deboraha Sprang, MD;  Location: [place] [facility];  Service: Cardiovascular;  Laterality: N/A;   LEFT HEART CATH AND CORONARY ANGIOGRAPHY N/A 08/28/2019   Procedure: LEFT HEART CATH AND CORONARY ANGIOGRAPHY;  Surgeon: [name], MD;  Location: [place] [facility];  Service: Cardiovascular;  Laterality: N/A;    Family Psychiatric History: Notes that his mother and aunt had mental health issues. Notes he belives they had anxiety  Family History:  Family History  Problem Relation Age of Onset   Arrhythmia  Father     Social History:  Social History   Socioeconomic History   Marital status: Married    Spouse name: Not on file   Number of children: Not on file   Years of education: Not on file   Highest education level: Not on file  Occupational History   Not on file  Tobacco Use   Smoking status: Every Day    Packs/day: 0.50    Types: Cigarettes    Last attempt to quit: 09/05/2019    Years since quitting: 0.9    Smokeless tobacco: Never   Tobacco comments:    x15 yrs as of 2021  Vaping Use   Vaping Use: Never used  Substance and Sexual Activity   Alcohol use: Not Currently    Comment: previous use but has not drank in months   Drug use: Never   Sexual activity: Yes    Birth control/protection: None  Other Topics Concern   Not on file  Social History Narrative   Not on file   Social Determinants of Health   Financial Resource Strain: Not on file  Food Insecurity: Not on file  Transportation Needs: Not on file  Physical Activity: Not on file  Stress: Not on file  Social Connections: Not on file    Allergies: No Known Allergies  Metabolic Disorder Labs: Lab Results  Component Value Date   HGBA1C 5.8 (H) 08/28/2019   MPG 119.76 08/28/2019   No results found for: PROLACTIN Lab Results  Component Value Date   CHOL 143 12/13/2019   TRIG 196 (H) 12/13/2019   HDL 37 (L) 12/13/2019   CHOLHDL 3.9 12/13/2019   VLDL 39 12/13/2019   LDLCALC 67 12/13/2019   [place] 64 11/17/2019   Lab Results  Component Value Date   TSH 3.181 08/28/2019    Therapeutic Level Labs: No results found for: LITHIUM No results found for: VALPROATE No components found for:  CBMZ  Current Medications: Current Outpatient Medications  Medication Sig Dispense Refill   atorvastatin (LIPITOR) 80 MG tablet Take 1 tab by mouth daily. 30 tablet 4   acetaminophen (TYLENOL) 500 MG tablet Take 500 mg by mouth every 6 (six) hours as needed for headache.     carvedilol (COREG) 12.5 MG tablet TAKE 1 TABLET (12.5 MG TOTAL) BY MOUTH 2 (TWO) TIMES DAILY WITH A MEAL. 60 tablet 11   dapagliflozin propanediol (FARXIGA) 10 MG TABS tablet TAKE 1 TABLET (10 MG TOTAL) BY MOUTH DAILY BEFORE BREAKFAST. 30 tablet 11   digoxin (LANOXIN) 0.125 MG tablet TAKE 1 TABLET (0.125 MG TOTAL) BY MOUTH DAILY. 90 tablet 3   furosemide (LASIX) 20 MG tablet Take 1 tablet (20 mg total) by mouth daily. 90 tablet 3   hydrOXYzine  (ATARAX/VISTARIL) 10 MG tablet TAKE 1 TABLET (10 MG TOTAL) BY MOUTH 3 (THREE) TIMES DAILY AS NEEDED. 90 tablet 3   mirtazapine (REMERON) 45 MG tablet TAKE 1 TABLET (45 MG TOTAL) BY MOUTH AT BEDTIME. 30 tablet 3   Multiple Vitamin (MULTIVITAMIN WITH MINERALS) TABS tablet Take 1 tablet by mouth daily.     omega-3 acid ethyl esters (LOVAZA) 1 g capsule TAKE 2 CAPSULES (2 G TOTAL) BY MOUTH TWO TIMES DAILY. 120 capsule 3   omeprazole (PRILOSEC) 20 MG capsule Take 20 mg by mouth as needed.     sacubitril-valsartan (ENTRESTO) 49-51 MG TAKE 1 TABLET BY MOUTH 2 (TWO) TIMES DAILY. 180 tablet 3   sertraline (ZOLOFT) 100 MG tablet TAKE 1.5 TABLETS (150 MG  TOTAL) BY MOUTH DAILY. 45 tablet 3   SILDENAFIL CITRATE PO Take 1-2 tablets by mouth daily as needed (ED). 30 mg tablet - take 1 - 2 tablets 30 to 45 min before sexual activity as needed     spironolactone (ALDACTONE) 25 MG tablet TAKE 1 TABLET (25 MG TOTAL) BY MOUTH DAILY. 30 tablet 11   warfarin (COUMADIN) 5 MG tablet TAKE 1 AND 1/2 TABLETS TO 2 TABLETS BY MOUTH DAILY AS DIRECTED BY THE COUMADIN CLINIC. 55 tablet 1   No current facility-administered medications for this visit.     Musculoskeletal: Strength & Muscle Tone:  Unable to assess due to telephonevisits Gait & Station:  Unable to assess due to telephone visits Patient leans: N/A  Psychiatric Specialty Exam: Review of Systems  There were no vitals taken for this visit.There is no height or weight on file to calculate BMI.  General Appearance:  Unable to assess due to telephone visit  Eye Contact:   Unable to assess due to telephone visit  Speech:  Clear and Coherent and Normal Rate  Volume:  Normal  Mood:  Euthymic  Affect:  Appropriate and Congruent  Thought Process:  Coherent, Goal Directed and Linear  Orientation:  Full (Time, Place, and Person)  Thought Content: WDL and Logical   Suicidal Thoughts:  No  Homicidal Thoughts:  No  Memory:  Immediate;   Good Recent;   Good Remote;    Good  Judgement:  Good  Insight:  Good  Psychomotor Activity:   Unable to assess due to telephone visit  Concentration:  Concentration: Good and Attention Span: Good  Recall:  Good  Fund of Knowledge: Good  Language: Good  Akathisia:   Unable to assess due to telephone visit  Handed:  Right  AIMS (if indicated): Not done  Assets:  Communication Skills Desire for Improvement Financial Resources/Insurance Housing Intimacy Social Support  ADL's:  Intact  Cognition: WNL  Sleep:  Good   Screenings: AIMS    Flowsheet Row Admission (Discharged) from 04/26/2014 in [facility]  AIMS Total Score 0      AUDIT    Flowsheet Row Admission (Discharged) from 04/26/2014 in [facility]  Alcohol Use Disorder Identification Test Final Score (AUDIT) 19      GAD-7    Flowsheet Row Video Visit from 08/22/2020 in [facility] Video Visit from 05/23/2020 in [facility] Video Visit from 02/23/2020 in [facility] Video Visit from 11/23/2019 in [facility] Counselor from 11/04/2019 in [facility]  Total GAD-7 Score 3 8 9 18 13       PHQ2-9    Flowsheet Row Video Visit from 08/22/2020 in [facility] Video Visit from 05/23/2020 in [facility] Video Visit from 02/23/2020 in [facility] Video Visit from 11/23/2019 in [facility] Counselor from 11/04/2019 in [place]  PHQ-2 Total Score 0 2 2 5 6   PHQ-9 Total Score 3 7 6 15 16       Flowsheet Row Video Visit from 05/23/2020 in [place] No Risk        Assessment and Plan: Patient notes that he is doing well on his current medication regimen.  No  medication changes made today.  Patient agreeable to continue medications as prescribed.  1. Generalized anxiety disorder  Continue- hydrOXYzine (ATARAX/VISTARIL) 10 MG tablet; TAKE 1 TABLET (10 MG TOTAL) BY MOUTH 3 (THREE) TIMES DAILY AS NEEDED.  Dispense: 90 tablet; Refill: 3 Continue- mirtazapine (REMERON) 45 MG tablet; Take 1 tablet (45 mg total) by mouth at bedtime.  Dispense: 30 tablet; Refill: 3 Continue- sertraline (ZOLOFT) 100 MG tablet; Take 1.5 tablets (150 mg total) by mouth daily.  Dispense: 45 tablet; Refill: 3  2. Moderate episode of recurrent major depressive disorder (HCC)  Continue- mirtazapine (REMERON) 45 MG tablet; Take 1 tablet (45 mg total) by mouth at bedtime.  Dispense: 30 tablet; Refill: 3 Continue- sertraline (ZOLOFT) 100 MG tablet; Take 1.5 tablets (150 mg total) by mouth daily.  Dispense: 45 tablet; Refill: 3  Follow up in 3 months Follow up with therapy  Salley Slaughter, NP 08/22/2020, 10:56 AM

## 2020-11-26 ENCOUNTER — Other Ambulatory Visit: Payer: Self-pay

## 2020-11-27 ENCOUNTER — Other Ambulatory Visit: Payer: Self-pay

## 2020-11-27 ENCOUNTER — Ambulatory Visit (INDEPENDENT_AMBULATORY_CARE_PROVIDER_SITE_OTHER): Payer: Medicaid Other

## 2020-11-27 DIAGNOSIS — I5022 Chronic systolic (congestive) heart failure: Secondary | ICD-10-CM | POA: Diagnosis not present

## 2020-11-27 LAB — CUP PACEART REMOTE DEVICE CHECK
Battery Remaining Longevity: 174 mo
Battery Remaining Percentage: 100 %
Brady Statistic RV Percent Paced: 0 %
Date Time Interrogation Session: 20221101040300
HighPow Impedance: 73 Ohm
Implantable Lead Implant Date: 20211101
Implantable Lead Location: 753860
Implantable Lead Model: 138
Implantable Lead Serial Number: 303500
Implantable Pulse Generator Implant Date: 20211101
Lead Channel Impedance Value: 502 Ohm
Lead Channel Setting Pacing Amplitude: 3.5 V
Lead Channel Setting Pacing Pulse Width: 0.4 ms
Lead Channel Setting Sensing Sensitivity: 0.5 mV
Pulse Gen Serial Number: 209902

## 2020-12-02 MED FILL — Dapagliflozin Propanediol Tab 10 MG (Base Equivalent): ORAL | 30 days supply | Qty: 30 | Fill #7 | Status: AC

## 2020-12-03 ENCOUNTER — Other Ambulatory Visit: Payer: Self-pay

## 2020-12-04 ENCOUNTER — Other Ambulatory Visit: Payer: Self-pay

## 2020-12-04 NOTE — Progress Notes (Signed)
Remote ICD transmission.   

## 2020-12-07 ENCOUNTER — Telehealth: Payer: Self-pay | Admitting: *Deleted

## 2020-12-07 DIAGNOSIS — Z006 Encounter for examination for normal comparison and control in clinical research program: Secondary | ICD-10-CM

## 2020-12-07 NOTE — Telephone Encounter (Signed)
Left message for patient about [facility]  Left CB number.

## 2020-12-10 ENCOUNTER — Ambulatory Visit (INDEPENDENT_AMBULATORY_CARE_PROVIDER_SITE_OTHER): Payer: Medicaid Other | Admitting: *Deleted

## 2020-12-10 ENCOUNTER — Other Ambulatory Visit: Payer: Self-pay

## 2020-12-10 DIAGNOSIS — Z7901 Long term (current) use of anticoagulants: Secondary | ICD-10-CM | POA: Diagnosis not present

## 2020-12-10 DIAGNOSIS — I513 Intracardiac thrombosis, not elsewhere classified: Secondary | ICD-10-CM | POA: Diagnosis not present

## 2020-12-10 DIAGNOSIS — Z5181 Encounter for therapeutic drug level monitoring: Secondary | ICD-10-CM | POA: Diagnosis not present

## 2020-12-10 LAB — POCT INR: INR: 2 (ref 2.0–3.0)

## 2020-12-10 NOTE — Patient Instructions (Signed)
Description   Today 2 tablets then continue taking warfarin 1.5 tablets daily except for 2 tablets on Sunday. Recheck INR in 4 weeks. Cou[facility]in Clinic [phone].   BILL [id number]

## 2020-12-16 ENCOUNTER — Other Ambulatory Visit: Payer: Self-pay | Admitting: Cardiology

## 2020-12-16 MED FILL — Sacubitril-Valsartan Tab 49-51 MG: ORAL | 30 days supply | Qty: 60 | Fill #7 | Status: AC

## 2020-12-16 MED FILL — Carvedilol Tab 12.5 MG: ORAL | 30 days supply | Qty: 60 | Fill #7 | Status: AC

## 2020-12-17 ENCOUNTER — Other Ambulatory Visit: Payer: Self-pay

## 2020-12-17 MED ORDER — SPIRONOLACTONE 25 MG PO TABS
ORAL_TABLET | Freq: Every day | ORAL | 11 refills | Status: DC
  Filled 2020-12-17: qty 30, 30d supply, fill #0
  Filled 2021-01-14: qty 30, 30d supply, fill #1
  Filled 2021-02-17: qty 30, 30d supply, fill #2
  Filled 2021-02-18: qty 30, 30d supply, fill #0
  Filled 2021-03-17: qty 30, 30d supply, fill #1
  Filled 2021-04-13: qty 30, 30d supply, fill #2
  Filled 2021-05-19: qty 30, 30d supply, fill #3
  Filled 2021-06-16: qty 30, 30d supply, fill #4
  Filled 2021-07-14: qty 30, 30d supply, fill #5
  Filled 2021-08-11: qty 30, 30d supply, fill #6
  Filled 2021-09-15: qty 30, 30d supply, fill #7
  Filled 2021-10-15: qty 30, 30d supply, fill #8
  Filled 2021-11-15: qty 30, 30d supply, fill #9

## 2020-12-18 ENCOUNTER — Other Ambulatory Visit: Payer: Self-pay

## 2020-12-24 ENCOUNTER — Encounter (HOSPITAL_COMMUNITY): Payer: Self-pay | Admitting: Cardiology

## 2020-12-24 ENCOUNTER — Ambulatory Visit (HOSPITAL_COMMUNITY)
Admission: RE | Admit: 2020-12-24 | Discharge: 2020-12-24 | Disposition: A | Discharge status: Home / Self Care | Payer: Medicaid Other | Source: Ambulatory Visit | Attending: Cardiology | Admitting: Cardiology

## 2020-12-24 ENCOUNTER — Other Ambulatory Visit: Payer: Self-pay | Admitting: Cardiology

## 2020-12-24 ENCOUNTER — Other Ambulatory Visit: Payer: Self-pay | Admitting: *Deleted

## 2020-12-24 ENCOUNTER — Telehealth (HOSPITAL_COMMUNITY): Payer: Self-pay | Admitting: Surgery

## 2020-12-24 ENCOUNTER — Telehealth: Payer: Self-pay | Admitting: *Deleted

## 2020-12-24 ENCOUNTER — Other Ambulatory Visit: Payer: Self-pay

## 2020-12-24 VITALS — BP 132/90 | HR 82 | Wt 205.4 lb

## 2020-12-24 DIAGNOSIS — I2510 Atherosclerotic heart disease of native coronary artery without angina pectoris: Secondary | ICD-10-CM | POA: Diagnosis not present

## 2020-12-24 DIAGNOSIS — Z95810 Presence of automatic (implantable) cardiac defibrillator: Secondary | ICD-10-CM | POA: Insufficient documentation

## 2020-12-24 DIAGNOSIS — Z87891 Personal history of nicotine dependence: Secondary | ICD-10-CM | POA: Diagnosis not present

## 2020-12-24 DIAGNOSIS — Z79899 Other long term (current) drug therapy: Secondary | ICD-10-CM | POA: Insufficient documentation

## 2020-12-24 DIAGNOSIS — I252 Old myocardial infarction: Secondary | ICD-10-CM | POA: Diagnosis not present

## 2020-12-24 DIAGNOSIS — I5022 Chronic systolic (congestive) heart failure: Secondary | ICD-10-CM | POA: Insufficient documentation

## 2020-12-24 DIAGNOSIS — E782 Mixed hyperlipidemia: Secondary | ICD-10-CM

## 2020-12-24 DIAGNOSIS — Z006 Encounter for examination for normal comparison and control in clinical research program: Secondary | ICD-10-CM

## 2020-12-24 DIAGNOSIS — I255 Ischemic cardiomyopathy: Secondary | ICD-10-CM | POA: Diagnosis not present

## 2020-12-24 DIAGNOSIS — Z7901 Long term (current) use of anticoagulants: Secondary | ICD-10-CM | POA: Diagnosis not present

## 2020-12-24 DIAGNOSIS — E785 Hyperlipidemia, unspecified: Secondary | ICD-10-CM | POA: Diagnosis not present

## 2020-12-24 LAB — LIPID PANEL
Cholesterol: 186 mg/dL (ref 0–200)
HDL: 45 mg/dL (ref >40)
LDL Cholesterol: 92 mg/dL (ref 0–99)
Total CHOL/HDL Ratio: 4.1 RATIO
Triglycerides: 244 mg/dL — ABNORMAL HIGH (ref <150)
VLDL: 49 mg/dL — ABNORMAL HIGH (ref 0–40)

## 2020-12-24 LAB — BASIC METABOLIC PANEL
Anion gap: 9 (ref 5–15)
BUN: 25 mg/dL — ABNORMAL HIGH (ref 6–20)
CO2: 24 mmol/L (ref 22–32)
Calcium: 9.4 mg/dL (ref 8.9–10.3)
Chloride: 103 mmol/L (ref 98–111)
Creatinine, Ser: 1.14 mg/dL (ref 0.61–1.24)
GFR, Estimated: >60 mL/min (ref >60)
Glucose, Bld: 109 mg/dL — ABNORMAL HIGH (ref 70–99)
Potassium: 3.7 mmol/L (ref 3.5–5.1)
Sodium: 136 mmol/L (ref 135–145)

## 2020-12-24 LAB — DIGOXIN LEVEL: Digoxin Level: 0.2 ng/mL — ABNORMAL LOW (ref 0.8–2.0)

## 2020-12-24 MED ORDER — ATORVASTATIN CALCIUM 80 MG PO TABS
80.0000 mg | ORAL_TABLET | Freq: Every day | ORAL | 11 refills | Status: DC
Start: 2020-12-24 — End: 2021-12-18
  Filled 2020-12-24: qty 30, 30d supply, fill #0
  Filled 2021-01-21: qty 30, 30d supply, fill #1
  Filled 2021-02-24: qty 30, 30d supply, fill #2
  Filled 2021-02-25: qty 30, 30d supply, fill #0
  Filled 2021-03-24: qty 30, 30d supply, fill #1
  Filled 2021-04-21: qty 30, 30d supply, fill #2
  Filled 2021-05-27: qty 30, 30d supply, fill #3
  Filled 2021-06-23: qty 30, 30d supply, fill #4
  Filled 2021-07-21: qty 30, 30d supply, fill #5
  Filled 2021-08-18: qty 30, 30d supply, fill #6
  Filled 2021-09-16: qty 30, 30d supply, fill #7
  Filled 2021-10-23: qty 30, 30d supply, fill #8
  Filled 2021-11-21: qty 30, 30d supply, fill #9

## 2020-12-24 MED ORDER — EZETIMIBE 10 MG PO TABS
10.0000 mg | ORAL_TABLET | Freq: Every day | ORAL | 3 refills | Status: DC
  Filled 2020-12-24 – 2021-03-24 (×2): qty 90, 90d supply, fill #0
  Filled 2021-06-16: qty 90, 90d supply, fill #1
  Filled 2021-09-15: qty 90, 90d supply, fill #2

## 2020-12-24 MED ORDER — SACUBITRIL-VALSARTAN 97-103 MG PO TABS
1.0000 | ORAL_TABLET | Freq: Two times a day (BID) | ORAL | 11 refills | Status: DC
  Filled 2020-12-24: qty 60, 30d supply, fill #0
  Filled 2021-01-21: qty 60, 30d supply, fill #1
  Filled 2021-02-17: qty 60, 30d supply, fill #2
  Filled 2021-02-18: qty 60, 30d supply, fill #0
  Filled 2021-03-24: qty 60, 30d supply, fill #1
  Filled 2021-04-21: qty 60, 30d supply, fill #2
  Filled 2021-05-19: qty 60, 30d supply, fill #3
  Filled 2021-06-23: qty 60, 30d supply, fill #4
  Filled 2021-07-21: qty 60, 30d supply, fill #5
  Filled 2021-08-18: qty 60, 30d supply, fill #6
  Filled 2021-09-16: qty 60, 30d supply, fill #7
  Filled 2021-10-23 – 2021-11-01 (×3): qty 60, 30d supply, fill #8

## 2020-12-24 NOTE — Telephone Encounter (Signed)
-----   Message from [name], MD sent at 12/24/2020 12:19 PM EST ----- LDL too high, has been good in the past.  Would add Zetia 10 mg daily.  Lipids in 2 months.

## 2020-12-24 NOTE — Telephone Encounter (Signed)
Left Message for Mr. Craig Flores about Appointments for Research  Patient will come in on 12/13 at 0930 am  Echo at 10 and Carotids at 11  Patient will not need to go to the Heart Failure clinic on 12/8, we will draw labs needed on 12/13, HF clinic aware of plan

## 2020-12-24 NOTE — Progress Notes (Signed)
Advanced Heart Failure Clinic Note   PCP: Pcp, [name] Cardiology: Dr. Aundra Dubin  Reason for Visit: Heart Failure    HPI: 55 y.o. male smoker was admitted on 08/28/19 for delayed presentation anterior STEMI. Had had >36 hr of chest pain prior to seeking medical attention. Pain progressed to more pleuritic like CP. Found to have anterior ST elevations w/ precordial Q waves on admit. Hs troponin >27,000. Urgent cardiac cath showed totally occluded mid LAD w/o collaterals and 40% mid RCA stenosis. It was suspected he had completed his infarct and residual pleuritic CP was post-MI pericarditis. No intervention was performed. 2D echo demonstrated moderately reduced LVEF, 45-50%, + apical thrombus and moderate AS. MV normal. RV systolic function mildly reduced.    He was placed on medical management for CAD and systolic HF. He was started on ASA, atorvastatin, Losartan and Coreg. Coumadin started for apical thrombus w/ heparin bridge.   Post cath, there were initial concerns for developing cardiogenic shock but he remained stable and did not require pressor/inotropic support. Co-ox remained stable. Repeat echo showed further reduction of LVEF down to 30-35% but no post MI mechanical complications. RV mildly reduced. He did require IV Lasix for pulmonary edema and volume status and dyspnea improved w/ diuresis. He was continued on GTDMT w/ losartan, spironolactone and Coreg. BP too soft for Entresto. He was treated w/ colchicine for post MI pericarditis w/ improvement in pleuritic chest pain. Given anterior MI w/ EF <35%, he was fitted w/ a LifeVest prior to discharge w/ plans to get repeat echo in 1 month.   He had repeat echo in 9/21, showing EF 25-30%.  He had [place] placed in 11/21.  Echo was done today and reviewed, EF 30% with no LV thrombus, normal RV, normal IVC, moderate AS with mean gradient 28 mmHg and AVA 1.08 cm^2.   He returns for followup of CHF and CAD.  Weight up 12 lbs.  He has  quit smoking since 9/22.  He is on Lovaza now as insurance will not cover Vascepa.  He is working out 40-50 minutes in the gym regularly.  No significant exertional dyspnea when walking on flat ground.  He would get short of breath walking fast up stairs. Generally feels bloated.  No orthopnea/PND.  No chest pain.   Labs (9/21): K 4.3, creatinine 0.93 Labs (11/21): LDL 67, HDL 37, K 4.4, creatinine 0.81 Labs (1/22): digoxin 0.5, K 4, creatinine 1.1.  Labs (5/22): digoxin 0.9, K 4.4, creatinine 1.06 Labs (8/22): K 4.3, creatinine 0.91, TGs 343, LDL 65, HDL 31, digoxin 1.0  [employer] Device interrogation: Heartlogic score 0  PMH: 1. GERD 2. Depression  3. CAD: Anterior STEMI in 8/21 with totally occluded LAD/no collaterals, 40% mid RCA.  Due to late presentation, no PCI was done.  - Had post-infarct pericarditis.  4. Chronic systolic CHF: Ischemic cardiomyopathy. [place].  - Echo (9/21) with EF 25-30%, normal RV, moderate AS with mean gradient 22 mmHg.  - Echo (8/22): EF 30% with no LV thrombus, normal RV, normal IVC, moderate AS with mean gradient 28 mmHg and AVA 1.08 cm^2.  5. LV thrombus: Apical thrombus noted on echo at time of MI in 8/21. Resolved by 9/21 echo.  6. Aortic stenosis: Moderate on 9/21 echo.  - Moderate on 8/22 echo.  7. Active smoker.   Current Outpatient Medications  Medication Sig Dispense Refill   acetaminophen (TYLENOL) 500 MG tablet Take 500 mg by mouth every 6 (six)  hours as needed for headache.     carvedilol (COREG) 12.5 MG tablet TAKE 1 TABLET (12.5 MG TOTAL) BY MOUTH 2 (TWO) TIMES DAILY WITH A MEAL. 60 tablet 11   dapagliflozin propanediol (FARXIGA) 10 MG TABS tablet TAKE 1 TABLET (10 MG TOTAL) BY MOUTH DAILY BEFORE BREAKFAST. 30 tablet 11   digoxin (LANOXIN) 0.125 MG tablet Take 0.5 tablets (0.0625 mg total) by mouth daily. 90 tablet 3   fenofibrate (TRICOR) 145 MG tablet Take 1 tablet (145 mg total) by mouth daily. 30 tablet 3    furosemide (LASIX) 20 MG tablet Take 1 tablet (20 mg total) by mouth daily. 90 tablet 3   hydrOXYzine (ATARAX/VISTARIL) 10 MG tablet TAKE 1 TABLET (10 MG TOTAL) BY MOUTH 3 (THREE) TIMES DAILY AS NEEDED. 90 tablet 3   mirtazapine (REMERON) 45 MG tablet TAKE 1 TABLET (45 MG TOTAL) BY MOUTH AT BEDTIME. 30 tablet 3   Multiple Vitamin (MULTIVITAMIN WITH MINERALS) TABS tablet Take 1 tablet by mouth daily.     omega-3 acid ethyl esters (LOVAZA) 1 g capsule TAKE 2 CAPSULES (2 G TOTAL) BY MOUTH TWO TIMES DAILY. 120 capsule 3   omeprazole (PRILOSEC) 20 MG capsule Take 20 mg by mouth as needed.     sacubitril-valsartan (ENTRESTO) 97-103 MG Take 1 tablet by mouth 2 (two) times daily. 60 tablet 11   sertraline (ZOLOFT) 100 MG tablet TAKE 1.5 TABLETS (150 MG TOTAL) BY MOUTH DAILY. 45 tablet 3   SILDENAFIL CITRATE PO Take 1-2 tablets by mouth daily as needed (ED). 30 mg tablet - take 1 - 2 tablets 30 to 45 min before sexual activity as needed     spironolactone (ALDACTONE) 25 MG tablet TAKE 1 TABLET (25 MG TOTAL) BY MOUTH DAILY. 30 tablet 11   varenicline (CHANTIX) 0.5 MG tablet Take 1 tablet (0.5 mg total) by mouth 2 (two) times daily. 180 tablet 3   warfarin (COUMADIN) 5 MG tablet TAKE 1 AND 1/2 TABLETS TO 2 TABLETS BY MOUTH DAILY AS DIRECTED BY THE COUMADIN CLINIC. 55 tablet 1   atorvastatin (LIPITOR) 80 MG tablet Take 1 tablet (80 mg total) by mouth daily. 30 tablet 11   No current facility-administered medications for this encounter.    No Known Allergies    Social History   Socioeconomic History   Marital status: Married    Spouse name: Not on file   Number of children: Not on file   Years of education: Not on file   Highest education level: Not on file  Occupational History   Not on file  Tobacco Use   Smoking status: Former    Packs/day: 0.50    Types: Cigarettes    Quit date: 09/05/2019    Years since quitting: 1.3   Smokeless tobacco: Never   Tobacco comments:    x15 yrs as of 2021   Vaping Use   Vaping Use: Never used  Substance and Sexual Activity   Alcohol use: Not Currently    Comment: previous use but has not drank in months   Drug use: Never   Sexual activity: Yes    Birth control/protection: None  Other Topics Concern   Not on file  Social History Narrative   Not on file   Social Determinants of Health   Financial Resource Strain: Not on file  Food Insecurity: Not on file  Transportation Needs: Not on file  Physical Activity: Not on file  Stress: Not on file  Social Connections: Not on file  Intimate Partner Violence: Not on file      Family History  Problem Relation Age of Onset   Arrhythmia Father     Vitals:   12/24/20 0920  BP: 132/90  Pulse: 82  SpO2: 96%  Weight: 93.2 kg (205 lb 6.4 oz)   Wt Readings from Last 3 Encounters:  12/24/20 93.2 kg (205 lb 6.4 oz)  09/17/20 87.8 kg (193 lb 9.6 oz)  06/12/20 84.6 kg (186 lb 9.6 oz)    PHYSICAL EXAM: General: NAD Neck: No JVD, no thyromegaly or thyroid nodule.  Lungs: Clear to auscultation bilaterally with normal respiratory effort. CV: Nondisplaced PMI.  Heart regular S1/S2, no S3/S4, 2/6 SEM RUSB with clear S2.  No peripheral edema.  No carotid bruit.  Normal pedal pulses.  Abdomen: Soft, nontender, no hepatosplenomegaly, no distention.  Skin: Intact without lesions or rashes.  Neurologic: Alert and oriented x 3.  Psych: Normal affect. Extremities: No clubbing or cyanosis.  HEENT: Normal.    ASSESSMENT & PLAN: 1. CAD: Admission 8/21 for acute anterior MI, delayed presentation with no ischemic pain, had pleuritic-type chest pain (post-MI pericarditis). LHC with totally occluded mid LAD w/o collaterals.  Suspected completed MI, no intervention. Plan medical management. No ischemic chest pain.  - No ASA given warfarin use.  - Continue atorvastatin 80 mg daily.  2. Chronic systolic CHF: Ischemic Cardiomyopathy.  Reduced EF post anterior MI.  Echo in 9/21 with EF 25-30%, peri-apical  akinesis, moderate AS. Cath w/ occluded mLAD but no intervention as he had completed his infarct. He has [place].  Echo in 8/22 with EF 30%.  Currently NYHA class II-III, not volume overloaded on exam or by Heartlogic.  - Increase Entresto to 97/103 bid with BMET today and in 10 days.   - Continue spironolactone 25 mg daily.  - Continue Coreg 12.5 mg bid.   - Continue dapagliflozin 10 mg daily.  - Continue digoxin 0.0625, check level today.   - Continue Lasix 20 mg daily.  - He wants to talk with research staff about Lanelle Bal, will arrange.  3. Post MI Pericarditis: Resolved.  4. LV thrombus: Large LV thrombus noted on echo in 8/21, resolved on 9/21 echo.   - Continue warfarin with EF still low.  5. H/o Tobacco Abuse: Congratulated him, has finally quit smoking.   6. HLD: Triglycerides too high in 8/22.  Now on fenofibrate  and Lovaza along with atorvastatin.  - Check lipids again today.   Followup 3 months   Loralie Champagne, MD  12/24/2020

## 2020-12-24 NOTE — Telephone Encounter (Signed)
I called patient and reviewed results as well as recommendations per Dr. Aundra Dubin.  Prescription sent to pharmacy of choice, medication list uodated and lab repeat scheduled for Jan 23rd at 11:30AM.

## 2020-12-24 NOTE — Patient Instructions (Addendum)
INCREASE Entresto to 97/103mg  (1 tab) twice a day  Labs today and repeat in 10 days We will only contact you if something comes back abnormal or we need to make some changes. Otherwise no news is good news!  Your physician recommends that you schedule a follow-up appointment in: 3 months with Dr Craig Flores  Please call office at [phone] option [phone] if you have any questions or concerns.   Do the following things EVERYDAY: Weigh yourself in the morning before breakfast. Write it down and keep it in a log. Take your medicines as prescribed Eat low salt foods--Limit salt (sodium) to 2000 mg per day.  Stay as active as you can everyday Limit all fluids for the day to less than 2 liters  At the [facility], you and your health needs are our priority. As part of our continuing mission to provide you with exceptional heart care, we have created designated Provider Care Teams. These Care Teams include your primary Cardiologist (physician) and Advanced Practice Providers (APPs- Physician Assistants and Nurse Practitioners) who all work together to provide you with the care you need, when you need it.   You may see any of the following providers on your designated Care Team at your next follow up: Dr Glori Bickers Dr Haynes Kerns, NP Lyda Jester, [place] [facility] [place], [place] Audry Riles, PharmD   Please be sure to bring in all your medications bottles to every appointment.

## 2020-12-25 ENCOUNTER — Other Ambulatory Visit: Payer: Self-pay

## 2020-12-31 ENCOUNTER — Other Ambulatory Visit: Payer: Self-pay

## 2020-12-31 ENCOUNTER — Other Ambulatory Visit (HOSPITAL_COMMUNITY): Payer: Self-pay | Admitting: Cardiology

## 2020-12-31 MED ORDER — DAPAGLIFLOZIN PROPANEDIOL 10 MG PO TABS
ORAL_TABLET | Freq: Every day | ORAL | 11 refills | Status: DC
  Filled 2020-12-31: qty 30, 30d supply, fill #0
  Filled 2021-02-03: qty 30, 30d supply, fill #1
  Filled 2021-02-04: qty 30, 30d supply, fill #0
  Filled 2021-03-03: qty 30, 30d supply, fill #1
  Filled 2021-04-07: qty 30, 30d supply, fill #2
  Filled 2021-05-05: qty 30, 30d supply, fill #3
  Filled 2021-06-02: qty 30, 30d supply, fill #4
  Filled 2021-06-30: qty 30, 30d supply, fill #5
  Filled 2021-08-04: qty 30, 30d supply, fill #6
  Filled 2021-09-02: qty 30, 30d supply, fill #7
  Filled 2021-09-30: qty 30, 30d supply, fill #8
  Filled 2021-10-29: qty 30, 30d supply, fill #9
  Filled 2021-11-27: qty 30, 30d supply, fill #10

## 2021-01-01 ENCOUNTER — Other Ambulatory Visit: Payer: Self-pay

## 2021-01-02 ENCOUNTER — Other Ambulatory Visit: Payer: Self-pay

## 2021-01-03 ENCOUNTER — Ambulatory Visit (HOSPITAL_COMMUNITY)
Admission: RE | Admit: 2021-01-03 | Discharge: 2021-01-03 | Disposition: A | Discharge status: Home / Self Care | Payer: Medicaid Other | Source: Ambulatory Visit | Attending: Cardiology | Admitting: Cardiology

## 2021-01-03 ENCOUNTER — Other Ambulatory Visit: Payer: Self-pay

## 2021-01-03 ENCOUNTER — Telehealth (HOSPITAL_COMMUNITY): Payer: Self-pay | Admitting: *Deleted

## 2021-01-03 DIAGNOSIS — I5022 Chronic systolic (congestive) heart failure: Secondary | ICD-10-CM | POA: Diagnosis not present

## 2021-01-03 DIAGNOSIS — I513 Intracardiac thrombosis, not elsewhere classified: Secondary | ICD-10-CM

## 2021-01-03 DIAGNOSIS — E782 Mixed hyperlipidemia: Secondary | ICD-10-CM | POA: Diagnosis not present

## 2021-01-03 DIAGNOSIS — I219 Acute myocardial infarction, unspecified: Secondary | ICD-10-CM | POA: Diagnosis not present

## 2021-01-03 DIAGNOSIS — Z7901 Long term (current) use of anticoagulants: Secondary | ICD-10-CM | POA: Insufficient documentation

## 2021-01-03 DIAGNOSIS — Z5181 Encounter for therapeutic drug level monitoring: Secondary | ICD-10-CM | POA: Insufficient documentation

## 2021-01-03 LAB — BASIC METABOLIC PANEL
Anion gap: 8 (ref 5–15)
BUN: 28 mg/dL — ABNORMAL HIGH (ref 6–20)
CO2: 25 mmol/L (ref 22–32)
Calcium: 9.6 mg/dL (ref 8.9–10.3)
Chloride: 103 mmol/L (ref 98–111)
Creatinine, Ser: 1.43 mg/dL — ABNORMAL HIGH (ref 0.61–1.24)
GFR, Estimated: 58 mL/min — ABNORMAL LOW (ref >60)
Glucose, Bld: 138 mg/dL — ABNORMAL HIGH (ref 70–99)
Potassium: 4.1 mmol/L (ref 3.5–5.1)
Sodium: 136 mmol/L (ref 135–145)

## 2021-01-03 LAB — LIPID PANEL
Cholesterol: 136 mg/dL (ref 0–200)
HDL: 38 mg/dL — ABNORMAL LOW (ref >40)
LDL Cholesterol: 42 mg/dL (ref 0–99)
Total CHOL/HDL Ratio: 3.6 RATIO
Triglycerides: 278 mg/dL — ABNORMAL HIGH (ref <150)
VLDL: 56 mg/dL — ABNORMAL HIGH (ref 0–40)

## 2021-01-03 NOTE — Telephone Encounter (Signed)
[facility] left vm stating pt needs PA for farxiga and lovaza.   Routed to [employer]

## 2021-01-04 ENCOUNTER — Encounter (HOSPITAL_COMMUNITY): Payer: Self-pay

## 2021-01-04 ENCOUNTER — Telehealth (HOSPITAL_COMMUNITY): Payer: Self-pay | Admitting: Pharmacy Technician

## 2021-01-04 ENCOUNTER — Other Ambulatory Visit (HOSPITAL_COMMUNITY): Payer: Self-pay

## 2021-01-04 NOTE — Telephone Encounter (Signed)
Patient Advocate Encounter   Received notification from Medicaid that prior authorization for [place] is required.   PA submitted on [employer] [id number] W Status is pending   Will continue to follow.  Advanced Heart Failure Patient Advocate Encounter  Prior Authorization for Lovaza has been submitted and approved.    PA# [id number] Effective dates: 01/04/21 through 01/04/22  Charlann Boxer, CPhT

## 2021-01-06 ENCOUNTER — Other Ambulatory Visit: Payer: Self-pay | Admitting: Cardiology

## 2021-01-07 ENCOUNTER — Other Ambulatory Visit: Payer: Self-pay

## 2021-01-07 ENCOUNTER — Encounter (HOSPITAL_COMMUNITY): Payer: Medicaid Other

## 2021-01-07 ENCOUNTER — Other Ambulatory Visit (HOSPITAL_COMMUNITY): Payer: Medicaid Other

## 2021-01-07 MED ORDER — WARFARIN SODIUM 5 MG PO TABS
ORAL_TABLET | ORAL | 1 refills | Status: DC
  Filled 2021-01-07: qty 55, 27d supply, fill #0
  Filled 2021-02-09: qty 55, 27d supply, fill #1
  Filled 2021-02-11: qty 55, 27d supply, fill #0

## 2021-01-08 ENCOUNTER — Other Ambulatory Visit: Payer: Self-pay

## 2021-01-08 ENCOUNTER — Ambulatory Visit (HOSPITAL_BASED_OUTPATIENT_CLINIC_OR_DEPARTMENT_OTHER)
Admission: RE | Admit: 2021-01-08 | Discharge: 2021-01-08 | Disposition: A | Discharge status: Home / Self Care | Payer: Self-pay | Source: Ambulatory Visit | Attending: Internal Medicine | Admitting: Internal Medicine

## 2021-01-08 ENCOUNTER — Ambulatory Visit (HOSPITAL_COMMUNITY)
Admission: RE | Admit: 2021-01-08 | Discharge: 2021-01-08 | Disposition: A | Discharge status: Home / Self Care | Payer: Self-pay | Source: Ambulatory Visit | Attending: Internal Medicine | Admitting: Internal Medicine

## 2021-01-08 ENCOUNTER — Encounter: Payer: Medicaid Other | Admitting: *Deleted

## 2021-01-08 VITALS — BP 103/62 | HR 74 | Wt 195.0 lb

## 2021-01-08 DIAGNOSIS — Z006 Encounter for examination for normal comparison and control in clinical research program: Secondary | ICD-10-CM

## 2021-01-08 LAB — ECHOCARDIOGRAM COMPLETE
AR max vel: 0.86 cm2
AV Area VTI: 1.02 cm2
AV Area mean vel: 0.84 cm2
AV Mean grad: 34 mmHg
AV Peak grad: 57.2 mmHg
Ao pk vel: 3.78 m/s
Area-P 1/2: 4.15 cm2
Calc EF: 38.8 %
S' Lateral: 4.6 cm
Single Plane A2C EF: 39.3 %
Single Plane A4C EF: 37.9 %
Weight: 3120 oz

## 2021-01-08 NOTE — Research (Signed)
Section A:  Administrative Section  Subject ID: _[id number]_ - _023__ - 015 Subject Initials: _K_ _R_ _O_  Date subject signed informed consent  _13__/_DEC__/_2022__      (DD /  MMM      / YYYY)  Has the subject been previously screened?    '[x]'  No     '[]'   Yes                                                         Previous Subject ID _1 _2 _4 _5_ - __ __ __ - 015   Section B:  Enrollment Criteria  In the investigator's opinion does the subject meet the FDA Indication for Use:  '[x]'  Yes    '[]'  No (STOP - subject does not qualify for the study)  Has the subject been previously, or are currently, randomized in the [facility] BeAT-HF Trial? '[]'  Yes (STOP - subject does not qualify for the study)   '[x]'  No  Has the subject received cardiac resynchronization therapy (CRT) within six months of enrollment, or is actively receiving CRT? '[]'  Yes (STOP - subject does not qualify for the study)   '[x]'  No  Section C:  Serum Biomarkers   NT-proBNP: ______120__________ Date:    _13_/_DEC__/_2022__                  (DD / MMM / [place])  eGFR:  _76_____ mL/min/1.46m Date:    _13_/_DEC_/_2022_                 (DD / MMM / [place]  Negative Pregnancy Test? '[x]'  N/A Date:    _____/_______/_______                  (DD / MMM / [place]eding   '[]'  Yes     '[]'  No   Section D:  Appropriate Surgical/Study Candidate:   Is the subject able to discontinue the use of antiplatelet drugs (e.g. aspirin) in advance of the procedure, if required? '[x]'  Yes   '[]'  No  Assessed by:   ___Wells BTrula Slade MD_______ Implanting Physician '[x]'  Yes Date:    _13_/_DEC_/_2022__                 (DD / MMM / [place]eding   '[]'  No   Section E:  Inclusion/Exclusion Criteria  Does the subject meet all Inclusion Criteria? '[]'  Yes   '[x]'  No (STOP - subject does not qualify for the study)   Check all that apply   '[]'  Age at least 21 years and no more than 80 years at the time of enrollment.   '[x]'  Appropriate candidate for the surgery as determined by an evaluation from the implanting  physician using a carotid duplex ultrasound (CDU) [or Computed Tomography Angiography (CTA) if CDU inconclusive or incomplete] and a review of medical history (including existence of infections that may increase implant risk).  Evaluation must confirm the following within 45 days of the BAROSTIM NEO implant: Appropriate medical condition and medical history for implantation of the BAROSTIM NEO System AND Anatomy that enables this implant procedure, with no vascular structures or orientations or neck anomalies that would be obstructive to the implantation path AND The artery planned for the BAROSTIM NEO implant must have: A carotid bifurcation below the level of the mandible AND No ulcerative carotid  arterial plaques AND No carotid atherosclerosis producing a 30% or greater stenosis in linear diameter in the internal carotid AND No carotid atherosclerosis producing a 30% or greater stenosis in linear diameter in the distal common carotid AND Have had no prior surgery, radiation, or endovascular stent placement in the carotid artery or the carotid sinus region AND Able to discontinue the use of antiplatelet drugs (e.g. aspirin) in advance of the procedure, if required   '[]'  Six-minute hall walk (6MHW) ? 150 m AND ? 400 m within 45 days prior to implant.   '[]'  Serum estimated glomerular filtration rate (eGFR) ? 25 mL/min/1.73 m2 using the CKD-EPI method within 45 days prior to the [facility] NEO implant.   '[]'  Body mass index ? 40 kg/m2 within 45 days prior to the [facility] NEO implant.   '[]'  If male and of childbearing potential, must use a medically accepted method of birth control (e.g., barrier method with spermicide, oral contraceptive, or abstinence) and agree to continue use of this method for the duration of the study. Women of childbearing potential must have a negative pregnancy test within 14 days prior to the [facility] NEO implant.   '[]'  Subjects implanted with a cardiac rhythm management device that  does not utilize an intracardiac lead, or implanted with a neurostimulation device, must be approved by the [place].   '[]'  At the end of screening/baseline, subject still meets the Barostim Neo Indication for Use   '[]'  Signed a [facility]-approved informed consent form for participation in this study.      Does the subject meet any Exclusion Criteria? '[]'  Yes (STOP - subject does not qualify for the study)   '[x]'  No     List criteria # met: __________________________   '[]'  Previously or currently randomized in the [facility] BeAT-HF Trial.   '[]'  Received cardiac resynchronization therapy (CRT) within six months of enrollment or is actively receiving CRT.   '[]'  Any of the following contraindications: Baroreflex failure or autonomic neuropathy  Uncontrolled, symptomatic cardiac bradyarrhythmias Known allergy to silicone or titanium   '[]'  Unstable ventricular arrhythmias.   '[]'  Presence of baseline cranial nerve dysfunction at risk from cervical interventions on the carotid bifurcation determined by the Ear, Nose and Throat (ENT) examination.   '[]'  Subjects with any surgery that has occurred, or is planned to occur, within 45 days of the [facility] NEO implant.   '[]'  Recent history (within 6 months of implant) of significant and uncontrolled bleeding.   '[]'  Known and untreated hypercoagulability state.   '[]'  An inappropriate study candidate as evidenced by: Solid organ or hematologic transplant, or currently being evaluated for an organ transplant. Has received or is receiving LVAD therapy or chronic dialysis. Current or planned treatment with intravenous positive inotrope therapy. Primary pulmonary hypertension. Severe COPD or severe restrictive lung disease (e.g. requires chronic oral steroid use or home oxygen use).  Heart failure secondary to a reversible cause, such as cardiac structural valvular disease, acute myocarditis and pericardial constriction. Clinically significant cardiac structural  valvular disease.  Unable or unwilling to fulfill the protocol medication compliance and follow-up requirements, for reasons including but not limited to an unresolved history of alcohol or substance abuse or psychiatric disorder. Active malignancy.  Any other serious medical condition that may adversely affect the safety of the participant or validity of the study, in the opinion of the investigator. Life expectancy less than one year.   '[]'  Any of the following within 3 months prior to the [place]  implant. Myocardial infarction Unstable angina Coronary intervention (e.g. CABG or PTCA)  Cerebral vascular accident or transient ischemic attack Sudden cardiac death  Surgical cardiac intervention (e.g. cardiac ablation, valve replacement)   '[]'  Enrolled and active in another (e.g. device, pharmaceutical, or biological) clinical study unless approved by the [facility] Clinical department.  Section F:  Adverse Events  Were there any Adverse Events that occurred since consent? '[]'  Yes (complete AE form)   '[x]'  No  Section H:  Medication Changes   Have there been any changes to the subject's home use medications for Arrhythmia, Antiplatelet/Anticoagulation and Heart failure medications during screening and baseline? '[]'  Yes (update Med form)   '[x]'  No  Section I:  Signature   Person completing form (Print) Name: Craig Kingdom :) _____________  Signature: Craig Kingdom :) ______ Date: __12/13/2022_______________     Section A:  Administrative Section  Subject ID: _[id number]_ - _023_ - 015 Subject Initials: _K_ _R_ _O_  Visit Interval:    '[x]'  Screening/Baseline    '[]'  Activation   '[]'  0.5 Month       '[]'  1 Month     '[]'  2 Month     '[]'  3 Month       '[]'  6 Month     '[]'  12 Month         '[]'  Unscheduled, reason for visit: _________________________________________  Section B:  Physical Assessment   Date:    _13_/_DEC_/_2022_   (DD / MMM / [place])  Weight: __195_  '[]'  kg    '[x]'  pounds Height (Screening  Visit Only): __175_  '[x]'  cm '[]'  inches  Blood Pressure: _103_  / _62_mmHg Heart Rate: __74__ bpm  Section E:  Signature    Person completing form (Print Name): __Kimberly Alba Flores :) __________  Signature: Craig Kingdom :) ___ Date: _12/13/2022___     SECTION A:  Administrative Section  Patient ID: _[id number]_ - _023_ - 015 Patient Initials: _K_ _R_ _O_  Visit Interval:    '[x]'  Screening/Baseline*    '[]'  Activation   '[]'  0.5 Month       '[]'  1 Month     '[]'  2 Month     '[]'  3 Month       '[]'  6 Month     '[]'  12 Month         '[]'  Unscheduled, reason for visit: _________________________________________  * For Screening / Baseline only the questions are based on 30 days prior to consent.  SECTION B:  COVID-19 Like Illness Symptoms   Has the subject experienced any cold, flu or COVID-19 symptoms since the last study visit? '[x]'  No  (skip to section C)     '[]'  Yes, date of onset:  _____/_____ MMM/YYYY    If yes, check all symptoms that apply:   '[]'  Fevers or chills   '[]'  New or Worsening Cough  '[]'  Productive  '[]'  Dry    '[]'  If yes to cough, indicate severity  '[]'  Constant  '[]'  Occasional, several per hour   '[]'  New or worsened shortness of breath   '[]'  Diarrhea   '[]'  Altered or reduced sense of smell or taste   '[]'  Muscle aches/Severe Fatigue   '[]'  Chest pain or tightness   '[]'  Sore throat   '[]'  Nausea or vomiting  SECTION C:  COVID-19 Like Illness Testing   Has the patient been tested for COVID-19 since the last study visit? '[x]'  No     '[]'  Yes, date of test:  _____/_____ MMM/YYYY  If yes, test results:   '[]'  Positive '[]'  Negative '[]'  Unknown  Has the patient been tested for COVID-19 Antibodies since the last study visit? '[x]'  No     '[]'  Yes, date of test:  _____/_____ MMM/YYYY    If yes, test results:   '[]'  Positive '[]'  Negative '[]'  Unknown  Has the patient been vaccinated for COVID-19 since the last study visit? '[]'  No     '[x]'  Yes, date of test:  _13_ /_jan_/_2022_ day/MMM/YYYY  Has the patient been tested  for Influenza (flu) since the last study visit? '[x]'  No     '[]'  Yes, date of test:  _____/_____ MMM/YYYY    If yes, test results:   '[]'  Positive '[]'  Negative '[]'  Unknown  Has the patient been vaccinated for Influenza (flu) since the last study visit? '[x]'  No     '[]'  Yes, date of test:  _____/_____ MMM/YYYY  SECTION D:  COVID-19 Like Illness Exposure  Has the subject been told that they might have had COVID-19/have symptoms suggestive of COVID-19 since the last study visit? '[x]'  No   '[]'  Yes   '[]'  Unknown  Has the subject been exposed to anyone with known or suspected COVID-19 since the last study visit? '[x]'  No   '[]'  Yes   '[]'  Unknown  Has the subject been told that they might have had the flu or influenza since the last study visit? '[x]'  No   '[]'  Yes   '[]'  Unknown  SECTION E:  Effects of COVID Pandemic on [place] Interactions  Since the last study visit, did the subject feel the need to go to an emergency department or hospital for their heart failure but decided not to because of concerns about COVID-19? '[x]'  No   '[]'  Yes   '[]'  Unknown    If yes, how did they seek care (check all that apply):   '[]'  Telemedicine visit '[]'  In-person '[]'  Clinic '[]'  Urgent Care   '[]'  Subject did not change hospital/ER use due to COVID-19 '[]'  Other, specify: ________________________  Since the last study visit, did the subject have a cardiology/HF related appointment cancelled/rescheduled due to COVID-19 pandemic? '[x]'  No   '[]'  Yes, how many? ___________   '[]'  Unknown  Since the last study visit, did the subject have any cardiology/HF related telemedicine visit due to COVID-19 pandemic? '[x]'  No   '[]'  Yes, how many? ___________   '[]'  Unknown  Since the last study visit, did the subject have a cardiology/HF procedure cancelled/rescheduled due to COVID-19 pandemic? '[x]'  No   '[]'  Yes, how many? ___________   '[]'  Unknown  SECTION F:  Effects of COVID Pandemic on Subject's Medications  Since the last visit, did any of  their heart failure medications change or stop, even for a short time?   If yes, enter change into medication eCRF '[x]'  No   '[]'  Yes, how many? ___________   '[]'  Unknown    If yes, why:   '[]'  Instructed by Doctor '[]'  Self-discontinued '[]'  Unknown  Other: ________________  Since the last visit, was the subject prescribed any medications for COVID-19? '[x]'  No   '[]'  Yes   '[]'  Unknown    If yes, what medications (generic name):  SECTION G:  Effects of COVID Pandemic on Subject's Lifestyle  How has the subject's activity/exercise level changed due to COVID-19 pandemic? '[]'  No change   '[]'  More activity/exercise   '[x]'  Less activity/exercise  How has the subject's smoking habits changed due to COVID-19? '[x]'  Does not smoke   '[]'   No change   '[]'  Smoke more   '[]'  Smoke less  How has the subject's alcohol drinking habits changed due to COVID-19? '[]'  Does not drink   '[x]'  No change   '[]'  Drink more   '[]'  Drink less  Section H:  Signature   Person completing form (Print Name): Craig Kingdom :) _____________  Signature: Craig Kingdom :) __ Date: _12/13/2022___________    Section A:  Administrative Section   Subject ID: _[id number]_ - _023_ - 015  Subject Initials: _K_ _R__ _O_  Section B:  Carotid Duplex Ultrasound (CDU) '[x]'  Done '[]'  Not Done  Date: _13_/_Dec_/_2022_ (DD / MMM / [place])  '[x]'  Images sent to [facility]  % atherosclerosis in the internal and distal common carotid and duplex measurements:  Right Side Measurements    Both internal and distal common carotids are less than 30% '[x]' Yes   '[]'   No (cannot be used for implant)   Comments on determination of <30% stenosis:  Internal Carotid (ICA) '[]'  Normal (no plaque) PSV (peak systolic velocity) _75_ cm/sec   '[x]'  ?10% EDV (end diastolic velocity) _25_ cm/sec   '[]'  16-49% (*see CTA) Linear diameter _46_ mm   '[]'  50-69% Normal spectral waveform  '[x]'  Yes   '[]'  ?70%  '[]'  No, specify pattern: __________________  Distal Common Carotid [facility]) '[]'  Normal (no  plaque) PSV (peak systolic velocity) _85_ cm/sec   '[x]'  ?27% EDV (end diastolic velocity) _78_ cm/sec   '[]'  16-49% (*see CTA) Linear diameter _69________ mm   '[]'  50-69% Normal spectral waveform  '[x]'  Yes   '[]'  ?70%  '[]'  No, specify pattern: __________________  Left Side Measurements   Both internal and distal common carotids are less than 30% '[x]' Yes       '[]' No (cannot be used for implant)   Comments on determination of <30% stenosis:  Internal Carotid (ICA) '[]'  Stenosis Normal (no plaque) PSV (peak systolic velocity) _24_ cm/sec   '[x]'  ?15%  EDV (end diastolic velocity) _23_ cm/sec   '[]'  16-49% (*see CTA) Linear diameter _37_ mm   '[]'  50-69%  Normal spectral waveform  '[x]'  Yes   '[]'  ?70%   '[]'  No, specify pattern: __________________  Distal Common Carotid [facility])  '[]'  Stenosis Normal (no plaque) PSV (peak systolic velocity) _53_ cm/sec   '[x]'  ?15%  EDV (end diastolic velocity) _61_ cm/sec   '[]'  16-49% (*see CTA) Linear diameter _52_ mm   '[]'  50-69%  Normal spectral waveform  '[x]'  Yes   '[]'  ?70%   '[]'  No, specify pattern: __________________  Name of person reading CDU:  Deitra Mayo, MD   Section C:  Computed Tomography Angiogram (CTA) (if required) '[]' Done '[x]' Not Done  '[]'  CDU was inconclusive (e.g. *stenosis 16-49%) Date: _____/_______/_______  (DD / MMM / [place])  '[]' Images sent to [facility]  '[]'  CDU was incomplete/not done    '[]'  CTA requested by [facility]    % atherosclerosis Internal Carotid (ICA) Distal Common Carotid [facility])  Right Side _______% _______%  Left Side _______% _______%  Name of person reading CTA:   Section D:  Physician Assessment  Are there any vascular structures or orientations or neck anomalies that would be obstructive to the implantation path? '[x]' No '[]' Yes (Specify side):     '[]'  Left   '[]'  Right  '[]'  Both  Has the subject had prior surgery, radiation, or endovascular stent placement in the carotid artery or the carotid sinus region? '[x]'  No '[]'  Yes (Specify side):     '[]'  Left   '[]'  Right    '[]'   Both  Is at least one carotid bifurcation below the level of the mandible? '[x]'  No '[]'  Yes (Specify side):     '[]'  Left   '[]'  Right   '[]'  Both  Are there any ulcerative carotid arterial plaques? '[x]'  No '[]'  Yes (Specify side):     '[]'  Left   '[]'  Right   '[]'  Both  Is this subject a candidate for the BATwire procedure? '[x]'  No '[]'  Yes (Specify side):     '[]'  Left   '[]'  Right   '[]'  Both  Section E:  Comments  Bilateral bifurcation to high    Section F:  Signature  Person completing form (Print Name): __Kimberly Alba Flores :) ____  Signature: Craig Kingdom :) _ Date: _12/13/2022________        Section A.   Administrative Section  Patient ID: _[id number]_ - _023_ - 015 Patient Initials: _K_ _R_ _O_  Exit / Termination Date: _13_/_DEC_/_2022_______                                            (DD / MMM / [place])  Section B.   Reason for Exit / Termination  '[x]'  Screening/baseline failure  '[]'  Failed Implant Attempt(s)  '[]'  Completed all Study Required Visits  '[]'  PI Withdrew  '[]'  Subject withdrew consent (add comment below)  '[]'  Subject refuses further follow-up testing (add comment below)  '[]'  Lost to Follow-up  '[]'  System Explanted (Complete Additional Procedure Worksheet)  '[]'  Deceased (Complete Subject Death Worksheet)  '[]'  Other, please specify: ________________________  Comments          Section C. Signature    Person completing form (Print Name): __Kimberly Alba Flores :) __ Signature: Craig Kingdom :) __ Date: __12/20/2022_____

## 2021-01-08 NOTE — Research (Signed)
Batwire Informed Consent   Subject Name: Craig Flores  Subject met inclusion and exclusion criteria.  The informed consent form, study requirements and expectations were reviewed with the subject and questions and concerns were addressed prior to the signing of the consent form.  The subject verbalized understanding of the trial requirements.  The subject agreed to participate in the [employer] trial and signed the informed consent at 0922 on 01/08/2021.  The informed consent was obtained prior to performance of any protocol-specific procedures for the subject.  A copy of the signed informed consent was given to the subject and a copy was placed in the subject's medical record.   [name] D

## 2021-01-09 ENCOUNTER — Ambulatory Visit (INDEPENDENT_AMBULATORY_CARE_PROVIDER_SITE_OTHER): Payer: Medicaid Other | Admitting: *Deleted

## 2021-01-09 DIAGNOSIS — I513 Intracardiac thrombosis, not elsewhere classified: Secondary | ICD-10-CM

## 2021-01-09 DIAGNOSIS — Z5181 Encounter for therapeutic drug level monitoring: Secondary | ICD-10-CM

## 2021-01-09 DIAGNOSIS — Z7901 Long term (current) use of anticoagulants: Secondary | ICD-10-CM | POA: Diagnosis not present

## 2021-01-09 LAB — BASIC METABOLIC PANEL
BUN/Creatinine Ratio: 23 — ABNORMAL HIGH (ref 9–20)
BUN: 26 mg/dL — ABNORMAL HIGH (ref 6–24)
CO2: 21 mmol/L (ref 20–29)
Calcium: 9.3 mg/dL (ref 8.7–10.2)
Chloride: 102 mmol/L (ref 96–106)
Creatinine, Ser: 1.14 mg/dL (ref 0.76–1.27)
Glucose: 135 mg/dL — ABNORMAL HIGH (ref 70–99)
Potassium: 4.1 mmol/L (ref 3.5–5.2)
Sodium: 139 mmol/L (ref 134–144)
eGFR: 76 mL/min/{1.73_m2} (ref >59)

## 2021-01-09 LAB — PRO B NATRIURETIC PEPTIDE: NT-Pro BNP: 120 pg/mL (ref 0–210)

## 2021-01-09 LAB — POCT INR: INR: 2.1 (ref 2.0–3.0)

## 2021-01-09 NOTE — Patient Instructions (Signed)
Description   Continue taking warfarin 1.5 tablets daily except for 2 tablets on Sunday. Recheck INR in 4 weeks. [facility] [phone].   [place] [id number]

## 2021-01-09 NOTE — Progress Notes (Signed)
[phone] batwire screening  Bifurcation to high

## 2021-01-09 NOTE — Progress Notes (Signed)
[phone] batwire screening patient  Screen fail due to high bifurcation

## 2021-01-09 NOTE — Progress Notes (Signed)
Screening for Batwire patient.  [phone] He is a screen fail due to high bifurcation

## 2021-01-14 ENCOUNTER — Other Ambulatory Visit: Payer: Self-pay

## 2021-01-14 MED FILL — Carvedilol Tab 12.5 MG: ORAL | 30 days supply | Qty: 60 | Fill #8 | Status: AC

## 2021-01-15 ENCOUNTER — Other Ambulatory Visit: Payer: Self-pay

## 2021-01-15 ENCOUNTER — Telehealth: Payer: Self-pay | Admitting: Pharmacist Clinician (PhC)/ Clinical Pharmacy Specialist

## 2021-01-15 NOTE — Telephone Encounter (Signed)
° °  Pre-operative Risk Assessment    Patient Name: Craig Flores  DOB: Sep 18, 1965 MRN: [id number]      Request for Surgical Clearance    Procedure:  barostim placement  Date of Surgery:  Clearance TBD                               Surgeon:   Surgeon's Group or Practice Name:  [facility] Phone number:  [phone] Fax number:     Type of Clearance Requested:   - Pharmacy:  Hold Warfarin (Coumadin)     Type of Anesthesia:  Not Indicated   Additional requests/questions:    Bubba Hales   01/15/2021, 4:03 PM

## 2021-01-15 NOTE — Telephone Encounter (Signed)
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    Procedure: barostim placement Date of procedure: TBD  CrCl 81 Platelet count LAST CBC > 1 YEAR  Patient will need updated CBC for final clearance  Will have primary cardiologist review.  Last echo (12/22) showed no apical thrombus, EF 30-35% with regional wall abnormalities

## 2021-01-15 NOTE — Telephone Encounter (Signed)
With EF still low, would bridge with Lovenox.

## 2021-01-17 NOTE — Telephone Encounter (Signed)
Patient will need warfarin bridging with Lovenox. Bridging will be coordinated with [place] at [facility]   Removing from preop pool due to awaiting appointment with [facility].   Craig Life, NP-C    01/17/2021, 11:35 AM [place] [address] Office [phone] Fax [phone]

## 2021-01-17 NOTE — Telephone Encounter (Signed)
Patient will need bridging with Lovenox.  INR not followed by [facility], will need to determine who follows and if they can arrange bridge

## 2021-01-17 NOTE — Telephone Encounter (Signed)
Correction:  patient has INR checked at [facility].   Next appt 1/11 - encounter note to check with patient regarding date of procedure

## 2021-01-22 ENCOUNTER — Other Ambulatory Visit: Payer: Self-pay

## 2021-01-23 ENCOUNTER — Other Ambulatory Visit: Payer: Self-pay

## 2021-01-25 DIAGNOSIS — Z23 Encounter for immunization: Secondary | ICD-10-CM | POA: Diagnosis not present

## 2021-02-04 ENCOUNTER — Other Ambulatory Visit: Payer: Self-pay

## 2021-02-06 ENCOUNTER — Other Ambulatory Visit: Payer: Self-pay

## 2021-02-06 ENCOUNTER — Ambulatory Visit (INDEPENDENT_AMBULATORY_CARE_PROVIDER_SITE_OTHER): Payer: Medicaid Other | Admitting: *Deleted

## 2021-02-06 DIAGNOSIS — Z7901 Long term (current) use of anticoagulants: Secondary | ICD-10-CM | POA: Diagnosis not present

## 2021-02-06 DIAGNOSIS — I513 Intracardiac thrombosis, not elsewhere classified: Secondary | ICD-10-CM | POA: Diagnosis not present

## 2021-02-06 DIAGNOSIS — Z5181 Encounter for therapeutic drug level monitoring: Secondary | ICD-10-CM | POA: Diagnosis not present

## 2021-02-06 LAB — POCT INR: INR: 1.7 — AB (ref 2.0–3.0)

## 2021-02-06 LAB — CBC
Hematocrit: 42.7 % (ref 37.5–51.0)
Hemoglobin: 14.7 g/dL (ref 13.0–17.7)
MCH: 30.6 pg (ref 26.6–33.0)
MCHC: 34.4 g/dL (ref 31.5–35.7)
MCV: 89 fL (ref 79–97)
Platelets: 256 10*3/uL (ref 150–450)
RBC: 4.81 x10E6/uL (ref 4.14–5.80)
RDW: 13.2 % (ref 11.6–15.4)
WBC: 6.1 10*3/uL (ref 3.4–10.8)

## 2021-02-06 NOTE — Patient Instructions (Signed)
Description   Today take 2 tablets then continue taking warfarin 1.5 tablets daily except for 2 tablets on Sunday. Recheck INR in 3 weeks. [facility] [phone].   [place] [id number]

## 2021-02-09 ENCOUNTER — Other Ambulatory Visit (HOSPITAL_COMMUNITY): Payer: Self-pay | Admitting: Cardiology

## 2021-02-11 ENCOUNTER — Other Ambulatory Visit: Payer: Self-pay

## 2021-02-11 MED ORDER — FENOFIBRATE 145 MG PO TABS
145.0000 mg | ORAL_TABLET | Freq: Every day | ORAL | 3 refills | Status: DC
  Filled 2021-02-11 – 2021-03-10 (×3): qty 30, 30d supply, fill #0
  Filled 2021-03-11: qty 30, 30d supply, fill #1
  Filled 2021-04-13: qty 30, 30d supply, fill #2
  Filled 2021-05-12: qty 30, 30d supply, fill #3

## 2021-02-12 ENCOUNTER — Other Ambulatory Visit: Payer: Self-pay | Admitting: Cardiology

## 2021-02-15 ENCOUNTER — Telehealth (INDEPENDENT_AMBULATORY_CARE_PROVIDER_SITE_OTHER): Payer: Medicaid Other | Admitting: Psychiatry

## 2021-02-15 ENCOUNTER — Other Ambulatory Visit: Payer: Self-pay

## 2021-02-15 ENCOUNTER — Encounter (HOSPITAL_COMMUNITY): Payer: Self-pay | Admitting: Psychiatry

## 2021-02-15 DIAGNOSIS — F331 Major depressive disorder, recurrent, moderate: Secondary | ICD-10-CM

## 2021-02-15 DIAGNOSIS — F411 Generalized anxiety disorder: Secondary | ICD-10-CM | POA: Diagnosis not present

## 2021-02-15 MED ORDER — MIRTAZAPINE 45 MG PO TABS
ORAL_TABLET | ORAL | 3 refills | Status: DC
  Filled 2021-02-15: qty 30, 30d supply, fill #0
  Filled 2021-03-17: qty 30, 30d supply, fill #1
  Filled 2021-04-21: qty 30, 30d supply, fill #2

## 2021-02-15 MED ORDER — SERTRALINE HCL 100 MG PO TABS
ORAL_TABLET | ORAL | 3 refills | Status: DC
  Filled 2021-02-15: qty 45, 30d supply, fill #0
  Filled 2021-03-17: qty 45, 30d supply, fill #1
  Filled 2021-04-21: qty 45, 30d supply, fill #2

## 2021-02-15 MED ORDER — HYDROXYZINE HCL 10 MG PO TABS
ORAL_TABLET | Freq: Three times a day (TID) | ORAL | 3 refills | Status: DC | PRN
  Filled 2021-02-15: qty 90, fill #0
  Filled 2021-03-24: qty 90, 30d supply, fill #0
  Filled 2021-04-21: qty 90, 30d supply, fill #1

## 2021-02-15 NOTE — Progress Notes (Signed)
BH MD/PA/NP OP Progress Note Virtual Visit via Telephone Note  I connected with Craig Flores on 02/15/21 at 10:00 AM EST by telephone and verified that I am speaking with the correct person using two identifiers.  Location: Patient: home Provider: Clinic   I discussed the limitations, risks, security and privacy concerns of performing an evaluation and management service by telephone and the availability of in person appointments. I also discussed with the patient that there may be a patient responsible charge related to this service. The patient expressed understanding and agreed to proceed.   I provided 30 minutes of non-face-to-face time during this encounter.     02/15/2021 9:35 AM Craig Flores  MRN:  [id number]  Chief Complaint: "I am doing okay"  HPI:  56 year old male seen today for follow up psychiatric evaluation. He has a psychiatric history of alcohol dependence, substance induced mood disorder, anxiety, depression, and SI. He is currently being managed on Zoloft 150mg  daily,  hydroxyzine 10 mg three times daily as needed, and Mirtazapine 45 mg at nightly. He notes that his medications are effective in managing his psychiatric conditions.   Today he is unable to logon virtually so assessment was done over the phone.  During exam he was pleasant, cooperative, and engaged in conversation. He informed Probation officer that overall he is doing okay.  He reports that his mood is stable and reports that his anxiety and depression are minimal.  Provider conducted a GAD-7 and patient scored a 2, at his last visit he scored a 2.  Provider also conducted PHQ-9 and patient scored a 9, at his last visit he scored a 3.  He notes that he has gained approximately 10 pounds since his last visit due to poor dietary choices.  He does endorse adequate sleep.   Today he denies SI/HI/VAH or paranoia.    No medication changes made today.  Patient agreeable to continue medications as prescribed.  No other  concerns noted at this time.  Visit Diagnosis:    ICD-10-CM   1. Generalized anxiety disorder  F41.1 hydrOXYzine (ATARAX) 10 MG tablet    mirtazapine (REMERON) 45 MG tablet    sertraline (ZOLOFT) 100 MG tablet    2. Moderate episode of recurrent major depressive disorder (HCC)  F33.1 mirtazapine (REMERON) 45 MG tablet    sertraline (ZOLOFT) 100 MG tablet      Past Psychiatric History: alcohol dependence, substance induced mood disorder, anxiety, depression, and SI.   Past Medical History:  Past Medical History:  Diagnosis Date   Acid reflux    CHF (congestive heart failure) ([place])    Depression    Migraines    Myocardial infarction [facility])     Past Surgical History:  Procedure Laterality Date   CORONARY/GRAFT ACUTE MI REVASCULARIZATION N/A 08/28/2019   Procedure: Coronary/Graft Acute MI Revascularization;  Surgeon: [name], MD;  Location: [place] [facility];  Service: Cardiovascular;  Laterality: N/A;   ICD IMPLANT N/A 11/28/2019   Procedure: ICD IMPLANT;  Surgeon: Deboraha Sprang, MD;  Location: [place] [facility];  Service: Cardiovascular;  Laterality: N/A;   LEFT HEART CATH AND CORONARY ANGIOGRAPHY N/A 08/28/2019   Procedure: LEFT HEART CATH AND CORONARY ANGIOGRAPHY;  Surgeon: [name], MD;  Location: [place] [facility];  Service: Cardiovascular;  Laterality: N/A;    Family Psychiatric History: Notes that his mother and aunt had mental health issues. Notes he belives they had anxiety  Family History:  Family History  Problem  Relation Age of Onset   Arrhythmia Father     Social History:  Social History   Socioeconomic History   Marital status: Married    Spouse name: Not on file   Number of children: Not on file   Years of education: Not on file   Highest education level: Not on file  Occupational History   Not on file  Tobacco Use   Smoking status: Former    Packs/day: 0.50    Types: Cigarettes    Quit date: 09/05/2019    Years since quitting: 1.4    Smokeless tobacco: Never   Tobacco comments:    x15 yrs as of 2021  Vaping Use   Vaping Use: Never used  Substance and Sexual Activity   Alcohol use: Not Currently    Comment: previous use but has not drank in months   Drug use: Never   Sexual activity: Yes    Birth control/protection: None  Other Topics Concern   Not on file  Social History Narrative   Not on file   Social Determinants of Health   Financial Resource Strain: Not on file  Food Insecurity: Not on file  Transportation Needs: Not on file  Physical Activity: Not on file  Stress: Not on file  Social Connections: Not on file    Allergies: No Known Allergies  Metabolic Disorder Labs: Lab Results  Component Value Date   HGBA1C 5.8 (H) 08/28/2019   MPG 119.76 08/28/2019   No results found for: PROLACTIN Lab Results  Component Value Date   CHOL 136 01/03/2021   TRIG 278 (H) 01/03/2021   HDL 38 (L) 01/03/2021   CHOLHDL 3.6 01/03/2021   VLDL 56 (H) 01/03/2021   LDLCALC 42 01/03/2021   [place] 92 12/24/2020   Lab Results  Component Value Date   TSH 3.181 08/28/2019    Therapeutic Level Labs: No results found for: LITHIUM No results found for: VALPROATE No components found for:  CBMZ  Current Medications: Current Outpatient Medications  Medication Sig Dispense Refill   acetaminophen (TYLENOL) 500 MG tablet Take 500 mg by mouth every 6 (six) hours as needed for headache.     atorvastatin (LIPITOR) 80 MG tablet Take 1 tablet (80 mg total) by mouth daily. 30 tablet 11   carvedilol (COREG) 12.5 MG tablet TAKE 1 TABLET (12.5 MG TOTAL) BY MOUTH 2 (TWO) TIMES DAILY WITH A MEAL. 60 tablet 11   dapagliflozin propanediol (FARXIGA) 10 MG TABS tablet TAKE 1 TABLET (10 MG TOTAL) BY MOUTH DAILY BEFORE BREAKFAST. 30 tablet 11   digoxin (LANOXIN) 0.125 MG tablet Take 0.5 tablets (0.0625 mg total) by mouth daily. 90 tablet 3   ezetimibe (ZETIA) 10 MG tablet Take 1 tablet (10 mg total) by mouth daily. 90 tablet 3    fenofibrate (TRICOR) 145 MG tablet Take 1 tablet (145 mg total) by mouth daily. 30 tablet 3   furosemide (LASIX) 20 MG tablet Take 1 tablet (20 mg total) by mouth daily. 90 tablet 3   hydrOXYzine (ATARAX) 10 MG tablet TAKE 1 TABLET (10 MG TOTAL) BY MOUTH 3 (THREE) TIMES DAILY AS NEEDED. 90 tablet 3   mirtazapine (REMERON) 45 MG tablet TAKE 1 TABLET (45 MG TOTAL) BY MOUTH AT BEDTIME. 30 tablet 3   Multiple Vitamin (MULTIVITAMIN WITH MINERALS) TABS tablet Take 1 tablet by mouth daily.     omega-3 acid ethyl esters (LOVAZA) 1 g capsule TAKE 2 CAPSULES (2 G TOTAL) BY MOUTH TWO TIMES DAILY. 120 capsule 3  omeprazole (PRILOSEC) 20 MG capsule Take 20 mg by mouth as needed.     sacubitril-valsartan (ENTRESTO) 97-103 MG Take 1 tablet by mouth 2 (two) times daily. 60 tablet 11   sertraline (ZOLOFT) 100 MG tablet TAKE 1.5 TABLETS (150 MG TOTAL) BY MOUTH DAILY. 45 tablet 3   SILDENAFIL CITRATE PO Take 1-2 tablets by mouth daily as needed (ED). 30 mg tablet - take 1 - 2 tablets 30 to 45 min before sexual activity as needed     spironolactone (ALDACTONE) 25 MG tablet TAKE 1 TABLET (25 MG TOTAL) BY MOUTH DAILY. 30 tablet 11   varenicline (CHANTIX) 0.5 MG tablet Take 1 tablet (0.5 mg total) by mouth 2 (two) times daily. 180 tablet 3   warfarin (COUMADIN) 5 MG tablet TAKE 1 AND 1/2 TABLETS TO 2 TABLETS BY MOUTH DAILY AS DIRECTED BY THE COUMADIN CLINIC. 55 tablet 1   No current facility-administered medications for this visit.     Musculoskeletal: Strength & Muscle Tone:  Unable to assess due to telephonevisits Gait & Station:  Unable to assess due to telephone visits Patient leans: N/A  Psychiatric Specialty Exam: Review of Systems  There were no vitals taken for this visit.There is no height or weight on file to calculate BMI.  General Appearance:  Unable to assess due to telephone visit  Eye Contact:   Unable to assess due to telephone visit  Speech:  Clear and Coherent and Normal Rate  Volume:   Normal  Mood:  Euthymic  Affect:  Appropriate and Congruent  Thought Process:  Coherent, Goal Directed and Linear  Orientation:  Full (Time, Place, and Person)  Thought Content: WDL and Logical   Suicidal Thoughts:  No  Homicidal Thoughts:  No  Memory:  Immediate;   Good Recent;   Good Remote;   Good  Judgement:  Good  Insight:  Good  Psychomotor Activity:   Unable to assess due to telephone visit  Concentration:  Concentration: Good and Attention Span: Good  Recall:  Good  Fund of Knowledge: Good  Language: Good  Akathisia:   Unable to assess due to telephone visit  Handed:  Right  AIMS (if indicated): Not done  Assets:  Communication Skills Desire for Improvement Financial Resources/Insurance Housing Intimacy Social Support  ADL's:  Intact  Cognition: WNL  Sleep:  Good   Screenings: AIMS    Flowsheet Row Admission (Discharged) from 04/26/2014 in [facility]  AIMS Total Score 0      AUDIT    Flowsheet Row Admission (Discharged) from 04/26/2014 in [facility] [facility]  Alcohol Use Disorder Identification Test Final Score (AUDIT) 19      GAD-7    Flowsheet Row Video Visit from 02/15/2021 in [facility] Video Visit from 11/23/2020 in [facility] Video Visit from 08/22/2020 in [facility] Video Visit from 05/23/2020 in [facility] Video Visit from 02/23/2020 in [facility]  Total GAD-7 Score 2 2 3 8 9       PHQ2-9    Flowsheet Row Video Visit from 02/15/2021 in [facility] Video Visit from 11/23/2020 in [facility] Video Visit from 08/22/2020 in [facility] Video Visit from 05/23/2020 in [facility] Video Visit from 02/23/2020 in [facility]  PHQ-2 Total Score 2 1 0 2 2  PHQ-9 Total Score 9 3 3 7 6       Flowsheet Row Video Visit from 05/23/2020 in [place] No Risk        Assessment and Plan: Patient notes that he is doing well on his current medication regimen.  No medication changes made today.  Patient agreeable to continue medications as prescribed.    1. Generalized anxiety disorder  Continue- hydrOXYzine (ATARAX/VISTARIL) 10 MG tablet; TAKE 1 TABLET (10 MG TOTAL) BY MOUTH 3 (THREE) TIMES DAILY AS NEEDED.  Dispense: 90 tablet; Refill: 3 Continue- mirtazapine (REMERON) 45 MG tablet; Take 1 tablet (45 mg total) by mouth at bedtime.  Dispense: 30 tablet; Refill: 3 Continue- sertraline (ZOLOFT) 100 MG tablet; Take 1.5 tablets (150 mg total) by mouth daily.  Dispense: 45 tablet; Refill: 3  2. Moderate episode of recurrent major depressive disorder (HCC)  Continue- mirtazapine (REMERON) 45 MG tablet; Take 1 tablet (45 mg total) by mouth at bedtime.  Dispense: 30 tablet; Refill: 3 Continue- sertraline (ZOLOFT) 100 MG tablet; Take 1.5 tablets (150 mg total) by mouth daily.  Dispense: 45 tablet; Refill: 3  Follow up in 3 months Follow up with therapy  Salley Slaughter, NP 02/15/2021, 9:35 AM

## 2021-02-17 ENCOUNTER — Encounter (HOSPITAL_COMMUNITY): Payer: Self-pay

## 2021-02-17 MED FILL — Carvedilol Tab 12.5 MG: ORAL | 30 days supply | Qty: 60 | Fill #9 | Status: CN

## 2021-02-18 ENCOUNTER — Other Ambulatory Visit (HOSPITAL_COMMUNITY): Payer: Self-pay

## 2021-02-18 ENCOUNTER — Other Ambulatory Visit: Payer: Self-pay

## 2021-02-18 ENCOUNTER — Ambulatory Visit (HOSPITAL_COMMUNITY)
Admission: RE | Admit: 2021-02-18 | Discharge: 2021-02-18 | Disposition: A | Discharge status: Home / Self Care | Payer: Medicaid Other | Source: Ambulatory Visit | Attending: Cardiology | Admitting: Cardiology

## 2021-02-18 DIAGNOSIS — E782 Mixed hyperlipidemia: Secondary | ICD-10-CM | POA: Diagnosis not present

## 2021-02-18 LAB — LIPID PANEL
Cholesterol: 126 mg/dL (ref 0–200)
HDL: 40 mg/dL — ABNORMAL LOW (ref >40)
LDL Cholesterol: 66 mg/dL (ref 0–99)
Total CHOL/HDL Ratio: 3.2 RATIO
Triglycerides: 99 mg/dL (ref <150)
VLDL: 20 mg/dL (ref 0–40)

## 2021-02-18 MED ORDER — DIGOXIN 125 MCG PO TABS: 0.0625 mg | ORAL_TABLET | Freq: Every day | ORAL | 3 refills | Status: DC

## 2021-02-18 MED ORDER — DIGOXIN 125 MCG PO TABS
0.0625 mg | ORAL_TABLET | Freq: Every day | ORAL | 6 refills | Status: DC
  Filled 2021-02-18: qty 30, 60d supply, fill #0
  Filled 2021-04-13: qty 30, 60d supply, fill #1
  Filled 2021-05-27 – 2021-06-05 (×3): qty 30, 60d supply, fill #2
  Filled 2021-07-29: qty 30, 60d supply, fill #3
  Filled 2021-09-30: qty 15, 30d supply, fill #4
  Filled 2021-10-23: qty 15, 30d supply, fill #5
  Filled 2021-11-15: qty 15, 30d supply, fill #6
  Filled 2021-12-15: qty 15, 30d supply, fill #7
  Filled 2022-01-13: qty 15, 30d supply, fill #8
  Filled 2022-02-15 – 2022-02-18 (×2): qty 15, 30d supply, fill #9

## 2021-02-18 MED FILL — Carvedilol Tab 12.5 MG: ORAL | 30 days supply | Qty: 60 | Fill #0 | Status: AC

## 2021-02-19 ENCOUNTER — Other Ambulatory Visit: Payer: Self-pay

## 2021-02-25 ENCOUNTER — Other Ambulatory Visit: Payer: Self-pay

## 2021-02-26 ENCOUNTER — Ambulatory Visit (INDEPENDENT_AMBULATORY_CARE_PROVIDER_SITE_OTHER): Payer: Medicaid Other

## 2021-02-26 DIAGNOSIS — I255 Ischemic cardiomyopathy: Secondary | ICD-10-CM

## 2021-02-26 LAB — CUP PACEART REMOTE DEVICE CHECK
Battery Remaining Longevity: 174 mo
Battery Remaining Percentage: 100 %
Brady Statistic RV Percent Paced: 0 %
Date Time Interrogation Session: 20230131011400
HighPow Impedance: 73 Ohm
Implantable Lead Implant Date: 20211101
Implantable Lead Location: 753860
Implantable Lead Model: 138
Implantable Lead Serial Number: 303500
Implantable Pulse Generator Implant Date: 20211101
Lead Channel Impedance Value: 479 Ohm
Lead Channel Setting Pacing Amplitude: 3.5 V
Lead Channel Setting Pacing Pulse Width: 0.4 ms
Lead Channel Setting Sensing Sensitivity: 0.5 mV
Pulse Gen Serial Number: 209902

## 2021-03-01 ENCOUNTER — Ambulatory Visit (INDEPENDENT_AMBULATORY_CARE_PROVIDER_SITE_OTHER): Payer: Medicaid Other | Admitting: *Deleted

## 2021-03-01 ENCOUNTER — Other Ambulatory Visit: Payer: Self-pay

## 2021-03-01 DIAGNOSIS — Z5181 Encounter for therapeutic drug level monitoring: Secondary | ICD-10-CM

## 2021-03-01 DIAGNOSIS — Z7901 Long term (current) use of anticoagulants: Secondary | ICD-10-CM

## 2021-03-01 DIAGNOSIS — I513 Intracardiac thrombosis, not elsewhere classified: Secondary | ICD-10-CM

## 2021-03-01 LAB — POCT INR: INR: 2 (ref 2.0–3.0)

## 2021-03-01 NOTE — Patient Instructions (Signed)
Description   Continue taking Warfarin 1.5 tablets daily except for 2 tablets on Sundays. Recheck INR in 4 weeks. Coumadin Clinic [phone].   [place] [id number]       

## 2021-03-03 ENCOUNTER — Other Ambulatory Visit: Payer: Self-pay | Admitting: Cardiology

## 2021-03-04 ENCOUNTER — Other Ambulatory Visit: Payer: Self-pay

## 2021-03-04 MED ORDER — OMEGA-3-ACID ETHYL ESTERS 1 G PO CAPS
ORAL_CAPSULE | ORAL | 3 refills | Status: DC
  Filled 2021-03-04: qty 120, 30d supply, fill #0
  Filled 2021-04-07: qty 120, 30d supply, fill #1
  Filled 2021-05-05: qty 120, 30d supply, fill #2
  Filled 2021-06-02: qty 120, 30d supply, fill #3

## 2021-03-06 NOTE — Progress Notes (Signed)
Remote ICD transmission.   

## 2021-03-08 ENCOUNTER — Other Ambulatory Visit: Payer: Self-pay

## 2021-03-11 ENCOUNTER — Other Ambulatory Visit: Payer: Self-pay

## 2021-03-12 ENCOUNTER — Other Ambulatory Visit: Payer: Self-pay

## 2021-03-17 ENCOUNTER — Other Ambulatory Visit: Payer: Self-pay | Admitting: Cardiology

## 2021-03-17 DIAGNOSIS — I513 Intracardiac thrombosis, not elsewhere classified: Secondary | ICD-10-CM

## 2021-03-17 MED FILL — Carvedilol Tab 12.5 MG: ORAL | 30 days supply | Qty: 60 | Fill #1 | Status: AC

## 2021-03-18 ENCOUNTER — Other Ambulatory Visit: Payer: Self-pay

## 2021-03-19 ENCOUNTER — Other Ambulatory Visit: Payer: Self-pay

## 2021-03-19 MED ORDER — WARFARIN SODIUM 5 MG PO TABS
ORAL_TABLET | ORAL | 1 refills | Status: DC
  Filled 2021-03-19: qty 55, 30d supply, fill #0
  Filled 2021-04-21: qty 55, 30d supply, fill #1

## 2021-03-20 ENCOUNTER — Other Ambulatory Visit: Payer: Self-pay

## 2021-03-20 IMAGING — DX DG CHEST 1V PORT
1 series · 1 of 1 positions shown · non-contrast
Comparison: None.

CLINICAL DATA: Shortness of breath, denies chest pain or cough

EXAM:
PORTABLE CHEST 1 VIEW

[chest]
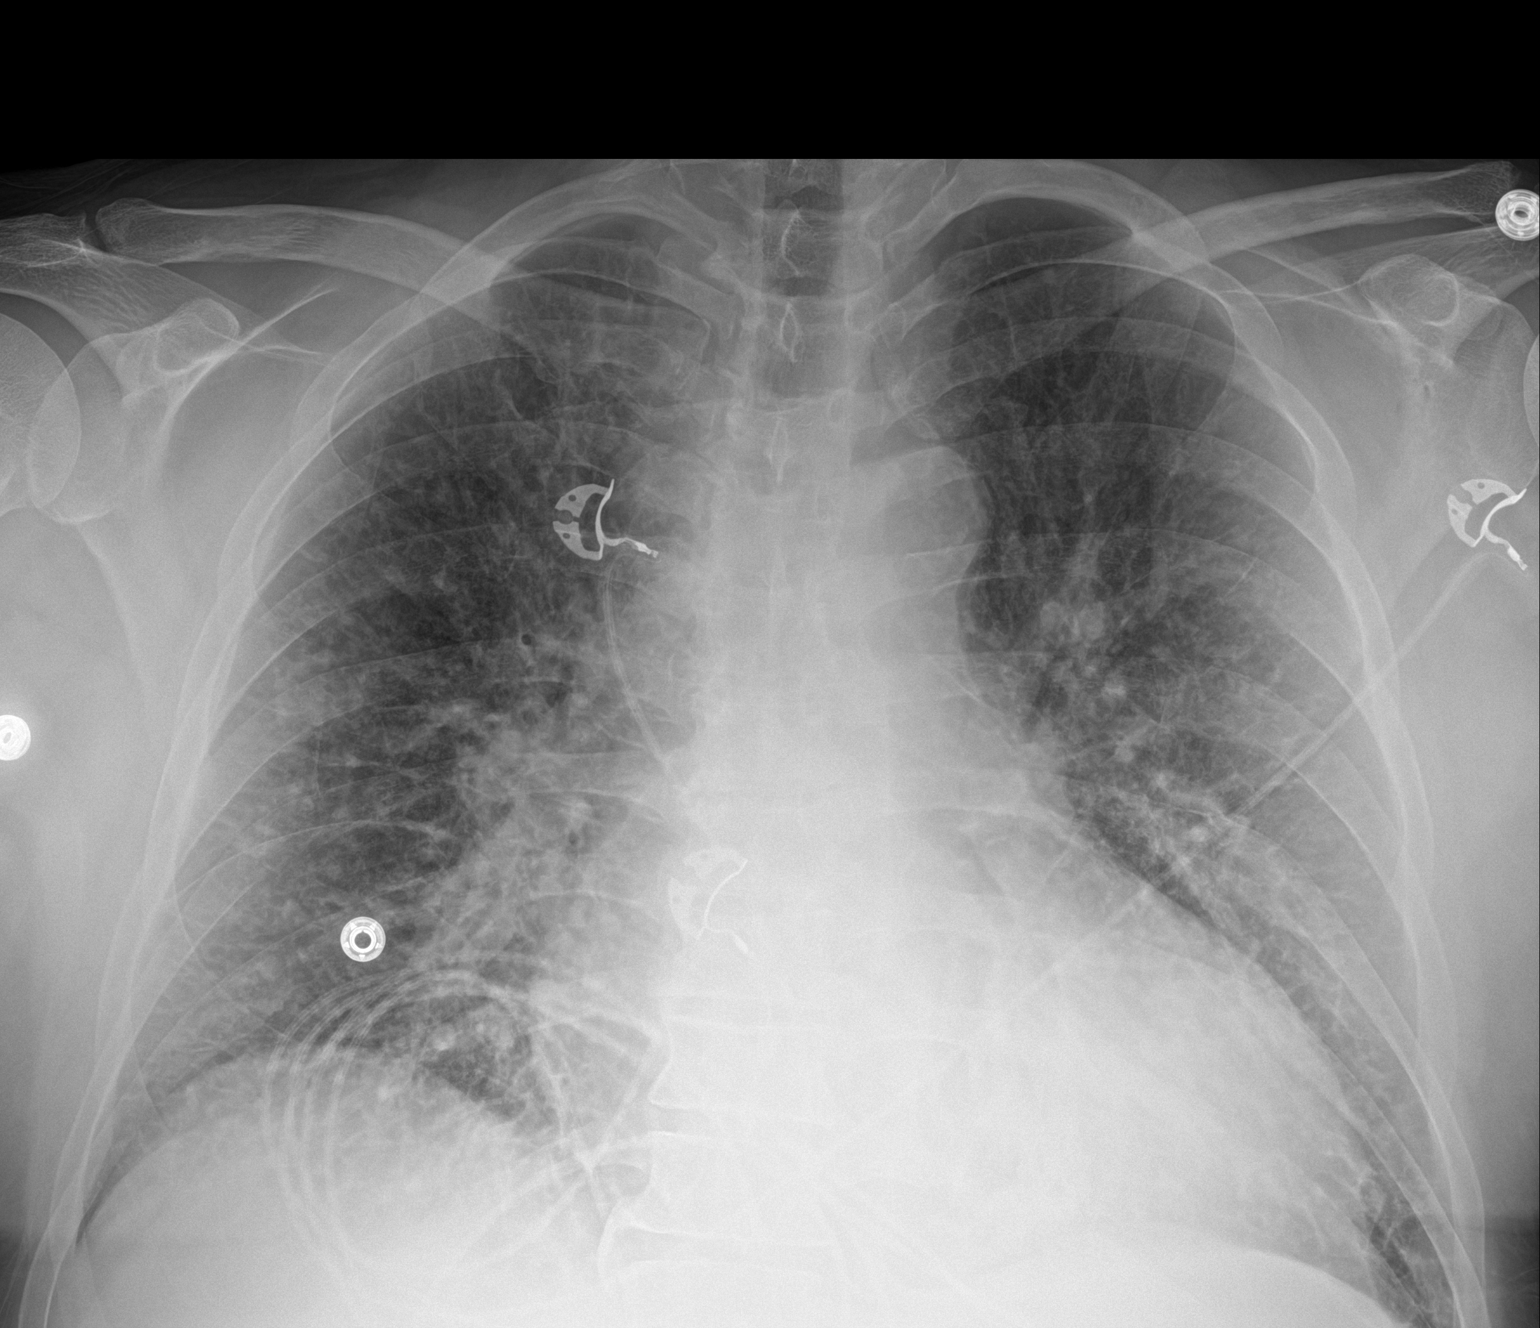

[1 of 1 positions shown; findings below may reference images not displayed]

FINDINGS: Diffuse basilar and perihilar predominant hazy and interstitial
opacities with extensive peripheral septal thickening, fissural
thickening. Cephalization and indistinctness of the pulmonary
vascularity is present as is an enlarged cardiac silhouette
accounting for the portable technique. No focal consolidation. No
pneumothorax or effusion though the left costophrenic sulcus is
partially collimated. The aorta is calcified. The remaining
cardiomediastinal contours are unremarkable. No acute osseous or
soft tissue abnormality. Telemetry leads overlie the chest.
IMPRESSION: Findings are most consistent with CHF/volume overload with
cardiomegaly and pulmonary edema.

## 2021-03-20 IMAGING — DX DG CHEST 1V
1 series · 1 of 1 positions shown · non-contrast
Comparison: 09/01/2019

CLINICAL DATA: Central venous catheter

EXAM:
CHEST  1 VIEW

[chest]
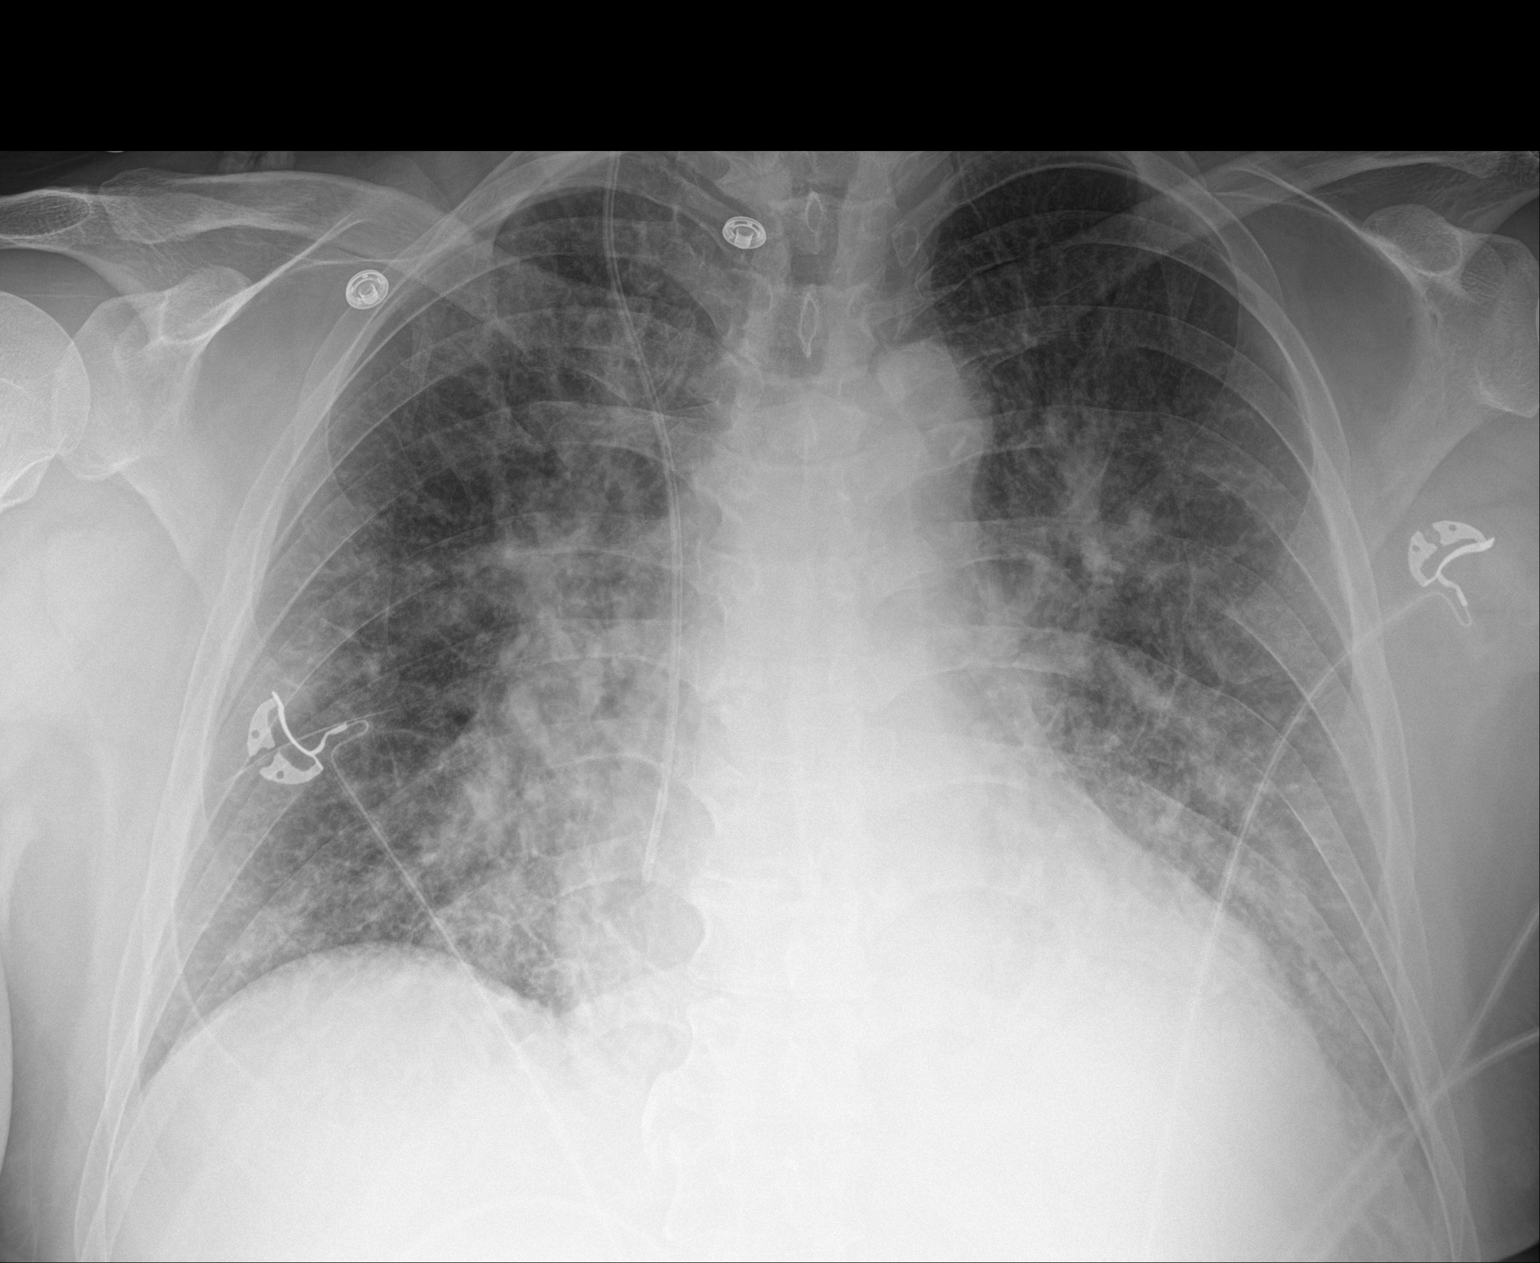

[1 of 1 positions shown; findings below may reference images not displayed]

FINDINGS: Tip of the right internal jugular vein approach central venous
catheter is in the proximal right atrium. Unchanged bilateral
interstitial opacities. Mild cardiomegaly. No pneumothorax.
IMPRESSION: Right internal jugular vein approach central venous catheter tip in
the proximal right atrium.

## 2021-03-22 ENCOUNTER — Other Ambulatory Visit: Payer: Self-pay

## 2021-03-25 ENCOUNTER — Other Ambulatory Visit: Payer: Self-pay

## 2021-03-26 ENCOUNTER — Other Ambulatory Visit: Payer: Self-pay

## 2021-03-26 ENCOUNTER — Ambulatory Visit (HOSPITAL_COMMUNITY)
Admission: RE | Admit: 2021-03-26 | Discharge: 2021-03-26 | Disposition: A | Discharge status: Home / Self Care | Payer: Medicaid Other | Source: Ambulatory Visit | Attending: Cardiology | Admitting: Cardiology

## 2021-03-26 ENCOUNTER — Encounter (HOSPITAL_COMMUNITY): Payer: Self-pay | Admitting: Cardiology

## 2021-03-26 ENCOUNTER — Telehealth (HOSPITAL_COMMUNITY): Payer: Self-pay | Admitting: *Deleted

## 2021-03-26 VITALS — BP 114/86 | HR 74 | Wt 204.6 lb

## 2021-03-26 DIAGNOSIS — E785 Hyperlipidemia, unspecified: Secondary | ICD-10-CM | POA: Diagnosis not present

## 2021-03-26 DIAGNOSIS — Z87891 Personal history of nicotine dependence: Secondary | ICD-10-CM | POA: Insufficient documentation

## 2021-03-26 DIAGNOSIS — Z79899 Other long term (current) drug therapy: Secondary | ICD-10-CM | POA: Diagnosis not present

## 2021-03-26 DIAGNOSIS — I252 Old myocardial infarction: Secondary | ICD-10-CM | POA: Diagnosis not present

## 2021-03-26 DIAGNOSIS — I350 Nonrheumatic aortic (valve) stenosis: Secondary | ICD-10-CM | POA: Diagnosis not present

## 2021-03-26 DIAGNOSIS — I5022 Chronic systolic (congestive) heart failure: Secondary | ICD-10-CM

## 2021-03-26 DIAGNOSIS — Z95810 Presence of automatic (implantable) cardiac defibrillator: Secondary | ICD-10-CM | POA: Insufficient documentation

## 2021-03-26 DIAGNOSIS — I5021 Acute systolic (congestive) heart failure: Secondary | ICD-10-CM

## 2021-03-26 DIAGNOSIS — Z7901 Long term (current) use of anticoagulants: Secondary | ICD-10-CM | POA: Diagnosis not present

## 2021-03-26 DIAGNOSIS — I2510 Atherosclerotic heart disease of native coronary artery without angina pectoris: Secondary | ICD-10-CM | POA: Diagnosis not present

## 2021-03-26 DIAGNOSIS — I255 Ischemic cardiomyopathy: Secondary | ICD-10-CM | POA: Insufficient documentation

## 2021-03-26 DIAGNOSIS — K921 Melena: Secondary | ICD-10-CM | POA: Diagnosis not present

## 2021-03-26 DIAGNOSIS — Z7984 Long term (current) use of oral hypoglycemic drugs: Secondary | ICD-10-CM | POA: Insufficient documentation

## 2021-03-26 LAB — BASIC METABOLIC PANEL
Anion gap: 12 (ref 5–15)
BUN: 40 mg/dL — ABNORMAL HIGH (ref 6–20)
CO2: 23 mmol/L (ref 22–32)
Calcium: 10.1 mg/dL (ref 8.9–10.3)
Chloride: 103 mmol/L (ref 98–111)
Creatinine, Ser: 1.49 mg/dL — ABNORMAL HIGH (ref 0.61–1.24)
GFR, Estimated: 55 mL/min — ABNORMAL LOW (ref >60)
Glucose, Bld: 102 mg/dL — ABNORMAL HIGH (ref 70–99)
Potassium: 4.4 mmol/L (ref 3.5–5.1)
Sodium: 138 mmol/L (ref 135–145)

## 2021-03-26 LAB — CBC
HCT: 42.6 % (ref 39.0–52.0)
Hemoglobin: 14.7 g/dL (ref 13.0–17.0)
MCH: 30.9 pg (ref 26.0–34.0)
MCHC: 34.5 g/dL (ref 30.0–36.0)
MCV: 89.7 fL (ref 80.0–100.0)
Platelets: 246 10*3/uL (ref 150–400)
RBC: 4.75 MIL/uL (ref 4.22–5.81)
RDW: 13.7 % (ref 11.5–15.5)
WBC: 6 10*3/uL (ref 4.0–10.5)
nRBC: 0 % (ref 0.0–0.2)

## 2021-03-26 LAB — DIGOXIN LEVEL: Digoxin Level: 0.5 ng/mL — ABNORMAL LOW (ref 0.8–2.0)

## 2021-03-26 MED ORDER — FUROSEMIDE 20 MG PO TABS: 20.0000 mg | ORAL_TABLET | ORAL | 3 refills | Status: DC

## 2021-03-26 MED ORDER — CARVEDILOL 12.5 MG PO TABS
18.7500 mg | ORAL_TABLET | Freq: Two times a day (BID) | ORAL | 11 refills | Status: DC
  Filled 2021-03-26 – 2021-04-13 (×2): qty 60, 20d supply, fill #0
  Filled 2021-05-05: qty 60, 20d supply, fill #1
  Filled 2021-05-19: qty 60, 20d supply, fill #2
  Filled 2021-06-09: qty 60, 20d supply, fill #3

## 2021-03-26 NOTE — Telephone Encounter (Signed)
Pt aware, agreeable, and verbalized understanding, repeat labs sch 3/9

## 2021-03-26 NOTE — Patient Instructions (Signed)
Medication Changes:  Stop you Chantix  Increase your Carvedilol to 18.75 mg Twice daily   Lab Work:  Labs done today, your results will be available in MyChart, we will contact you for abnormal readings.   Testing/Procedures:  nnoe  Referrals:  You have been referred to the Gastroenterologist. Their office will call you to arrange your appointment   Special Instructions // Education:  none  Follow-Up in: 3 months  At the [facility], you and your health needs are our priority. We have a designated team specialized in the treatment of Heart Failure. This Care Team includes your primary Heart Failure Specialized Cardiologist (physician), Advanced Practice Providers (APPs- Physician Assistants and Nurse Practitioners), and Pharmacist who all work together to provide you with the care you need, when you need it.   You may see any of the following providers on your designated Care Team at your next follow up:  Dr Glori Bickers Dr Haynes Kerns, NP Lyda Jester, [place] [facility] [place], [place] Audry Riles, PharmD   Please be sure to bring in all your medications bottles to every appointment.   Need to Contact us:  If you have any questions or concerns before your next appointment please send [place] a message through [place] or call our office at [phone].    TO LEAVE A MESSAGE FOR THE NURSE SELECT OPTION 2, PLEASE LEAVE A MESSAGE INCLUDING: YOUR NAME DATE OF BIRTH CALL BACK NUMBER REASON FOR CALL**this is important as we prioritize the call backs  YOU WILL RECEIVE A CALL BACK THE SAME DAY AS LONG AS YOU CALL BEFORE 4:00 PM

## 2021-03-26 NOTE — Telephone Encounter (Signed)
-----   Message from [name], MD sent at 03/26/2021  4:33 PM EST ----- Stop Lasix x 2 days then decrease to 20 mg every other day.  BMET 10 days.

## 2021-03-26 NOTE — Progress Notes (Signed)
Advanced Heart Failure Clinic Note   PCP: Pcp, [name] Cardiology: Dr. Aundra Dubin  Reason for Visit: Heart Failure    HPI: 56 y.o. male smoker was admitted on 08/28/19 for delayed presentation anterior STEMI. Had had >36 hr of chest pain prior to seeking medical attention. Pain progressed to more pleuritic like CP. Found to have anterior ST elevations w/ precordial Q waves on admit. Hs troponin >27,000. Urgent cardiac cath showed totally occluded mid LAD w/o collaterals and 40% mid RCA stenosis. It was suspected he had completed his infarct and residual pleuritic CP was post-MI pericarditis. No intervention was performed. 2D echo demonstrated moderately reduced LVEF, 45-50%, + apical thrombus and moderate AS. MV normal. RV systolic function mildly reduced.    He was placed on medical management for CAD and systolic HF. He was started on ASA, atorvastatin, Losartan and Coreg. Coumadin started for apical thrombus w/ heparin bridge.   Post cath, there were initial concerns for developing cardiogenic shock but he remained stable and did not require pressor/inotropic support. Co-ox remained stable. Repeat echo showed further reduction of LVEF down to 30-35% but no post MI mechanical complications. RV mildly reduced. He did require IV Lasix for pulmonary edema and volume status and dyspnea improved w/ diuresis. He was continued on GTDMT w/ losartan, spironolactone and Coreg. BP too soft for Entresto. He was treated w/ colchicine for post MI pericarditis w/ improvement in pleuritic chest pain. Given anterior MI w/ EF <35%, he was fitted w/ a LifeVest prior to discharge w/ plans to get repeat echo in 1 month.   He had repeat echo in 9/21, showing EF 25-30%.  He had [place] placed in 11/21.  Echo in 8/22 showed EF 30% with no LV thrombus, normal RV, normal IVC, moderate AS with mean gradient 28 mmHg and AVA 1.08 cm^2. Echo in 12/22 showed EF 30-35%, no LV thrombus, possibly bicuspid aortic valve with  mean gradient 34 mmHg and AVA 1.02 cm^2, normal RV, IVC normal.   He returns for followup of CHF and CAD.  He continues to stay off cigarettes.  He is short of breath with moderate exertion.  Generally feels easily fatigued.  No chest pain.  No lightheadedness.  Tries to spend an hour on a treadmill and lifts weights several times/week.  He has noted some red blood in stool (on warfarin).   Labs (9/21): K 4.3, creatinine 0.93 Labs (11/21): LDL 67, HDL 37, K 4.4, creatinine 0.81 Labs (1/22): digoxin 0.5, K 4, creatinine 1.1.  Labs (5/22): digoxin 0.9, K 4.4, creatinine 1.06 Labs (8/22): K 4.3, creatinine 0.91, TGs 343, LDL 65, HDL 31, digoxin 1.0 Labs (12/22): K 4.1, creatinine 1.14, pro-BNP 120 Labs (1/23): hgb 14.7, LDL 66, TGs 99  PMH: 1. GERD 2. Depression  3. CAD: Anterior STEMI in 8/21 with totally occluded LAD/no collaterals, 40% mid RCA.  Due to late presentation, no PCI was done.  - Had post-infarct pericarditis.  4. Chronic systolic CHF: Ischemic cardiomyopathy. [place].  - Echo (9/21) with EF 25-30%, normal RV, moderate AS with mean gradient 22 mmHg.  - Echo (8/22): EF 30% with no LV thrombus, normal RV, normal IVC, moderate AS with mean gradient 28 mmHg and AVA 1.08 cm^2.  - Echo (12/22): EF 30-35%, no LV thrombus, possibly bicuspid aortic valve with mean gradient 34 mmHg and AVA 1.02 cm^2, normal RV, IVC normal.  5. LV thrombus: Apical thrombus noted on echo at time of MI in 8/21. Resolved by  9/21 echo.  6. Aortic stenosis: Moderate on 12/22 echo. .  7. Active smoker.   Current Outpatient Medications  Medication Sig Dispense Refill   acetaminophen (TYLENOL) 500 MG tablet Take 500 mg by mouth every 6 (six) hours as needed for headache.     atorvastatin (LIPITOR) 80 MG tablet Take 1 tablet (80 mg total) by mouth daily. 30 tablet 11   dapagliflozin propanediol (FARXIGA) 10 MG TABS tablet TAKE 1 TABLET (10 MG TOTAL) BY MOUTH DAILY BEFORE BREAKFAST. 30 tablet 11    digoxin (LANOXIN) 0.125 MG tablet Take 0.5 tablets (0.0625 mg total) by mouth daily. 30 tablet 6   ezetimibe (ZETIA) 10 MG tablet Take 1 tablet (10 mg total) by mouth daily. 90 tablet 3   fenofibrate (TRICOR) 145 MG tablet Take 1 tablet (145 mg total) by mouth daily. 30 tablet 3   hydrOXYzine (ATARAX) 10 MG tablet TAKE 1 TABLET (10 MG TOTAL) BY MOUTH 3 (THREE) TIMES DAILY AS NEEDED. 90 tablet 3   mirtazapine (REMERON) 45 MG tablet TAKE 1 TABLET (45 MG TOTAL) BY MOUTH AT BEDTIME. 30 tablet 3   Multiple Vitamin (MULTIVITAMIN WITH MINERALS) TABS tablet Take 1 tablet by mouth daily.     omega-3 acid ethyl esters (LOVAZA) 1 g capsule TAKE 2 CAPSULES (2 G TOTAL) BY MOUTH TWO TIMES DAILY. 120 capsule 3   omeprazole (PRILOSEC) 20 MG capsule Take 20 mg by mouth as needed.     sacubitril-valsartan (ENTRESTO) 97-103 MG Take 1 tablet by mouth 2 (two) times daily. 60 tablet 11   sertraline (ZOLOFT) 100 MG tablet TAKE 1.5 TABLETS (150 MG TOTAL) BY MOUTH DAILY. 45 tablet 3   SILDENAFIL CITRATE PO Take 1-2 tablets by mouth daily as needed (ED). 30 mg tablet - take 1 - 2 tablets 30 to 45 min before sexual activity as needed     spironolactone (ALDACTONE) 25 MG tablet TAKE 1 TABLET (25 MG TOTAL) BY MOUTH DAILY. 30 tablet 11   warfarin (COUMADIN) 5 MG tablet TAKE 1 AND 1/2 TABLETS TO 2 TABLETS BY MOUTH DAILY AS DIRECTED BY THE COUMADIN CLINIC. 55 tablet 1   carvedilol (COREG) 12.5 MG tablet Take 1.5 tablets (18.75 mg total) by mouth 2 (two) times daily with a meal. 60 tablet 11   [START ON 03/29/2021] furosemide (LASIX) 20 MG tablet Take 1 tablet (20 mg total) by mouth every other day. 90 tablet 3   No current facility-administered medications for this encounter.    No Known Allergies    Social History   Socioeconomic History   Marital status: Married    Spouse name: Not on file   Number of children: Not on file   Years of education: Not on file   Highest education level: Not on file  Occupational History    Not on file  Tobacco Use   Smoking status: Former    Packs/day: 0.50    Types: Cigarettes    Quit date: 09/05/2019    Years since quitting: 1.5   Smokeless tobacco: Never   Tobacco comments:    x15 yrs as of 2021  Vaping Use   Vaping Use: Never used  Substance and Sexual Activity   Alcohol use: Not Currently    Comment: previous use but has not drank in months   Drug use: Never   Sexual activity: Yes    Birth control/protection: None  Other Topics Concern   Not on file  Social History Narrative   Not on file  Social Determinants of Health   Financial Resource Strain: Not on file  Food Insecurity: Not on file  Transportation Needs: Not on file  Physical Activity: Not on file  Stress: Not on file  Social Connections: Not on file  Intimate Partner Violence: Not on file      Family History  Problem Relation Age of Onset   Arrhythmia Father     Vitals:   03/26/21 1022  BP: 114/86  Pulse: 74  SpO2: 99%  Weight: 92.8 kg (204 lb 9.6 oz)   Wt Readings from Last 3 Encounters:  03/26/21 92.8 kg (204 lb 9.6 oz)  01/08/21 88.5 kg (195 lb)  12/24/20 93.2 kg (205 lb 6.4 oz)    PHYSICAL EXAM: General: NAD Neck: No JVD, no thyromegaly or thyroid nodule.  Lungs: Clear to auscultation bilaterally with normal respiratory effort. CV: Nondisplaced PMI.  Heart regular S1/S2, no S3/S4, 2/6 SEM RUSB with clear S2.  No peripheral edema.  No carotid bruit.  Normal pedal pulses.  Abdomen: Soft, nontender, no hepatosplenomegaly, no distention.  Skin: Intact without lesions or rashes.  Neurologic: Alert and oriented x 3.  Psych: Normal affect. Extremities: No clubbing or cyanosis.  HEENT: Normal.   ASSESSMENT & PLAN: 1. CAD: Admission 8/21 for acute anterior MI, delayed presentation with no ischemic pain, had pleuritic-type chest pain (post-MI pericarditis). LHC with totally occluded mid LAD w/o collaterals.  Suspected completed MI, no intervention. Plan medical management. No  ischemic chest pain.  - No ASA given warfarin use.  - Continue atorvastatin 80 mg daily.  2. Chronic systolic CHF: Ischemic Cardiomyopathy.  Reduced EF post anterior MI.  Echo in 9/21 with EF 25-30%, peri-apical akinesis, moderate AS. Cath w/ occluded mLAD but no intervention as he had completed his infarct. He has [place].  Echo in 8/22 with EF 30%.  Echo in 12/22 with EF 30-35%, normal RV, moderate AS. Currently NYHA class II-III, not volume overloaded on exam.   - Continue Entresto 97/103 bid.   - Continue spironolactone 25 mg daily. BMET today.  - Increase Coreg to 18.75 mg bid.   - Continue dapagliflozin 10 mg daily.  - Continue digoxin 0.0625, check level today.   - Continue Lasix 20 mg daily, can decrease if creatinine elevated.  - High carotid bifurcation so cannot get Batwire, but being evaluation for commercial baroreceptor activation therapy.   3. Post MI Pericarditis: Resolved.  4. LV thrombus: Large LV thrombus noted on echo in 8/21, resolved on 9/21 echo.   - Continue warfarin with EF still low.  5. H/o Tobacco Abuse: Congratulated him, still off cigarettes. - He can stop Chantix at this point.   6. HLD: Good lipids in 1/23.  7. Hematochezia: He is on warfarin.   - CBC today.  - Refer to GI for evaluation.   Followup 3 months with APP.   Loralie Champagne, MD  03/26/2021

## 2021-04-04 ENCOUNTER — Other Ambulatory Visit (HOSPITAL_COMMUNITY): Payer: Medicaid Other

## 2021-04-08 ENCOUNTER — Other Ambulatory Visit: Payer: Self-pay

## 2021-04-15 ENCOUNTER — Other Ambulatory Visit: Payer: Self-pay

## 2021-04-18 ENCOUNTER — Encounter: Payer: Self-pay | Admitting: Gastroenterology

## 2021-04-19 ENCOUNTER — Other Ambulatory Visit: Payer: Self-pay

## 2021-04-19 ENCOUNTER — Ambulatory Visit (HOSPITAL_COMMUNITY)
Admission: RE | Admit: 2021-04-19 | Discharge: 2021-04-19 | Disposition: A | Discharge status: Home / Self Care | Payer: Medicaid Other | Source: Ambulatory Visit | Attending: Cardiology | Admitting: Cardiology

## 2021-04-19 DIAGNOSIS — I5022 Chronic systolic (congestive) heart failure: Secondary | ICD-10-CM | POA: Diagnosis not present

## 2021-04-19 LAB — BASIC METABOLIC PANEL
Anion gap: 6 (ref 5–15)
BUN: 25 mg/dL — ABNORMAL HIGH (ref 6–20)
CO2: 27 mmol/L (ref 22–32)
Calcium: 9.3 mg/dL (ref 8.9–10.3)
Chloride: 106 mmol/L (ref 98–111)
Creatinine, Ser: 1.29 mg/dL — ABNORMAL HIGH (ref 0.61–1.24)
GFR, Estimated: >60 mL/min (ref >60)
Glucose, Bld: 99 mg/dL (ref 70–99)
Potassium: 4.9 mmol/L (ref 3.5–5.1)
Sodium: 139 mmol/L (ref 135–145)

## 2021-04-21 ENCOUNTER — Encounter (HOSPITAL_COMMUNITY): Payer: Self-pay

## 2021-04-22 ENCOUNTER — Other Ambulatory Visit (HOSPITAL_COMMUNITY): Payer: Self-pay | Admitting: *Deleted

## 2021-04-22 ENCOUNTER — Other Ambulatory Visit: Payer: Self-pay

## 2021-04-22 MED ORDER — FUROSEMIDE 20 MG PO TABS
20.0000 mg | ORAL_TABLET | ORAL | 3 refills | Status: DC
  Filled 2021-04-22: qty 45, 90d supply, fill #0
  Filled 2021-07-07 – 2021-07-21 (×2): qty 45, 90d supply, fill #1
  Filled 2021-10-06 – 2021-10-10 (×2): qty 45, 90d supply, fill #2
  Filled ????-??-??: fill #2

## 2021-04-25 ENCOUNTER — Ambulatory Visit (INDEPENDENT_AMBULATORY_CARE_PROVIDER_SITE_OTHER): Payer: Medicaid Other

## 2021-04-25 DIAGNOSIS — I513 Intracardiac thrombosis, not elsewhere classified: Secondary | ICD-10-CM | POA: Diagnosis not present

## 2021-04-25 DIAGNOSIS — Z7901 Long term (current) use of anticoagulants: Secondary | ICD-10-CM

## 2021-04-25 DIAGNOSIS — Z5181 Encounter for therapeutic drug level monitoring: Secondary | ICD-10-CM

## 2021-04-25 LAB — POCT INR: INR: 2.6 (ref 2.0–3.0)

## 2021-04-25 NOTE — Patient Instructions (Signed)
Description   ?Continue taking Warfarin 1.5 tablets daily except for 2 tablets on Sundays. Recheck INR in 5 weeks. Coumadin Clinic [phone].  ? ?[place] [id number] ? ?  ?   ?

## 2021-04-26 ENCOUNTER — Telehealth (HOSPITAL_COMMUNITY): Payer: Medicaid Other | Admitting: Psychiatry

## 2021-04-30 ENCOUNTER — Ambulatory Visit: Payer: Medicaid Other | Admitting: Gastroenterology

## 2021-04-30 ENCOUNTER — Encounter: Payer: Self-pay | Admitting: Gastroenterology

## 2021-04-30 ENCOUNTER — Telehealth: Payer: Self-pay | Admitting: *Deleted

## 2021-04-30 ENCOUNTER — Other Ambulatory Visit: Payer: Self-pay

## 2021-04-30 VITALS — BP 90/64 | HR 80 | Ht 68.5 in | Wt 200.5 lb

## 2021-04-30 DIAGNOSIS — Z800 Family history of malignant neoplasm of digestive organs: Secondary | ICD-10-CM | POA: Diagnosis not present

## 2021-04-30 DIAGNOSIS — Z7901 Long term (current) use of anticoagulants: Secondary | ICD-10-CM | POA: Diagnosis not present

## 2021-04-30 DIAGNOSIS — K625 Hemorrhage of anus and rectum: Secondary | ICD-10-CM | POA: Diagnosis not present

## 2021-04-30 DIAGNOSIS — R197 Diarrhea, unspecified: Secondary | ICD-10-CM | POA: Diagnosis not present

## 2021-04-30 MED ORDER — PLENVU 140 G PO SOLR
1.0000 | ORAL | 0 refills | Status: DC
  Filled 2021-04-30 (×2): qty 1, 1d supply, fill #0

## 2021-04-30 NOTE — Telephone Encounter (Signed)
I have left a message for patient to call back. 

## 2021-04-30 NOTE — Telephone Encounter (Signed)
Patient is advised that per Dr Aundra Dubin, he may hold coumadin 5 days prior to procedure. Patient verbalizes understanding. ?

## 2021-04-30 NOTE — Progress Notes (Addendum)
? ? ? ?04/30/2021 ?Craig Flores ?[id number] ?[birth year] ? ? ?HISTORY OF PRESENT ILLNESS: This is a 56 year old male who is new to our practice.  He is here today to discuss colonoscopy.  He tells me he had one about 20 years ago, not in [place].  He has family history of colon cancer in his brother.  He says that his brother is 2 years younger than him, so is currently 46, and he was diagnosed several years ago, still living.  Patient reports that about 4 to 6 weeks he was seeing bright red blood on the toilet paper with bowel movements consistently.  He has not seen any now for about the past month or so.  He does have loose stools that he says began at some point while being placed on all of these new medications so he believes that it is side effect related.  He is not having issues with incontinence, etc. so he just kind of deals with it.  He does have heart failure with an EF of 30 to 35% on most recent echo and is on Coumadin, CAD.  Follows with Dr. Aundra Dubin.  Hgb normal at 14.7 grams. ? ? ?Past Medical History:  ?Diagnosis Date  ? Acid reflux   ? Anxiety   ? CHF (congestive heart failure) ([place])   ? Depression   ? GERD (gastroesophageal reflux disease)   ? HLD (hyperlipidemia)   ? Migraines   ? Myocardial infarction [facility])   ? ?Past Surgical History:  ?Procedure Laterality Date  ? CORONARY/GRAFT ACUTE MI REVASCULARIZATION N/A 08/28/2019  ? Procedure: Coronary/Graft Acute MI Revascularization;  Surgeon: [name], MD;  Location: [facility] [facility];  Service: Cardiovascular;  Laterality: N/A;  ? ICD IMPLANT N/A 11/28/2019  ? Procedure: ICD IMPLANT;  Surgeon: Deboraha Sprang, MD;  Location: [place] [facility];  Service: Cardiovascular;  Laterality: N/A;  ? LEFT HEART CATH AND CORONARY ANGIOGRAPHY N/A 08/28/2019  ? Procedure: LEFT HEART CATH AND CORONARY ANGIOGRAPHY;  Surgeon: [name], MD;  Location: [place] [facility];  Service: Cardiovascular;  Laterality: N/A;  ? ? reports that he has been smoking  cigarettes. He has been smoking an average of .5 packs per day. He has never used smokeless tobacco. He reports current alcohol use. He reports that he does not use drugs. ?family history includes Arrhythmia in his father; Cancer in his maternal grandmother and mother; Colon cancer (age of onset: 51) in his brother; Diabetes in his paternal grandmother; Heart attack in his maternal grandfather and paternal grandfather; Prostate cancer in his father. ?No Known Allergies ? ?  ?Outpatient Encounter Medications as of 04/30/2021  ?Medication Sig  ? acetaminophen (TYLENOL) 500 MG tablet Take 500 mg by mouth every 6 (six) hours as needed for headache.  ? atorvastatin (LIPITOR) 80 MG tablet Take 1 tablet (80 mg total) by mouth daily.  ? carvedilol (COREG) 12.5 MG tablet Take 1.5 tablets (18.75 mg total) by mouth 2 (two) times daily with a meal.  ? dapagliflozin propanediol (FARXIGA) 10 MG TABS tablet TAKE 1 TABLET (10 MG TOTAL) BY MOUTH DAILY BEFORE BREAKFAST.  ? digoxin (LANOXIN) 0.125 MG tablet Take 0.5 tablets (0.0625 mg total) by mouth daily.  ? ezetimibe (ZETIA) 10 MG tablet Take 1 tablet (10 mg total) by mouth daily.  ? fenofibrate (TRICOR) 145 MG tablet Take 1 tablet (145 mg total) by mouth daily.  ? furosemide (LASIX) 20 MG tablet Take 1 tablet (20 mg total) by mouth  every other day.  ? hydrOXYzine (ATARAX) 10 MG tablet TAKE 1 TABLET (10 MG TOTAL) BY MOUTH 3 (THREE) TIMES DAILY AS NEEDED.  ? mirtazapine (REMERON) 45 MG tablet TAKE 1 TABLET (45 MG TOTAL) BY MOUTH AT BEDTIME.  ? Multiple Vitamin (MULTIVITAMIN WITH MINERALS) TABS tablet Take 1 tablet by mouth daily.  ? omega-3 acid ethyl esters (LOVAZA) 1 g capsule TAKE 2 CAPSULES (2 G TOTAL) BY MOUTH TWO TIMES DAILY.  ? omeprazole (PRILOSEC) 20 MG capsule Take 20 mg by mouth as needed.  ? sacubitril-valsartan (ENTRESTO) 97-103 MG Take 1 tablet by mouth 2 (two) times daily.  ? sertraline (ZOLOFT) 100 MG tablet TAKE 1.5 TABLETS (150 MG TOTAL) BY MOUTH DAILY.  ? SILDENAFIL  CITRATE PO Take 1-2 tablets by mouth daily as needed (ED). 30 mg tablet - take 1 - 2 tablets 30 to 45 min before sexual activity as needed  ? spironolactone (ALDACTONE) 25 MG tablet TAKE 1 TABLET (25 MG TOTAL) BY MOUTH DAILY.  ? warfarin (COUMADIN) 5 MG tablet TAKE 1 AND 1/2 TABLETS TO 2 TABLETS BY MOUTH DAILY AS DIRECTED BY THE COUMADIN CLINIC.  ? [DISCONTINUED] MITIGARE 0.6 MG CAPS Take 0.6 mg by mouth daily.  ? ?No facility-administered encounter medications on file as of 04/30/2021.  ? ? ? ?REVIEW OF SYSTEMS  : All other systems reviewed and negative except where noted in the History of Present Illness. ? ? ?PHYSICAL EXAM: ?BP 90/64 (BP Location: Left Arm, Patient Position: Sitting, Cuff Size: Normal)   Pulse 80   Ht 5' 8.5" (1.74 m) Comment: height measured without shoes  Wt 200 lb 8 oz (90.9 kg)   BMI 30.04 kg/m?  ?General: Well developed white male in no acute distress ?Head: Normocephalic and atraumatic ?Eyes:  Sclerae anicteric, conjunctiva pink. ?Ears: Normal auditory acuity ?Lungs: Clear throughout to auscultation; no W/R/R. ?Heart: Regular rate and rhythm; SEM noted. ?Abdomen: Soft, non-distended.  BS present.  Non-tender. ?Rectal:  Will be done at the time of colonoscopy. ?Musculoskeletal: Symmetrical with no gross deformities  ?Skin: No lesions on visible extremities ?Extremities: No edema  ?Neurological: Alert oriented x 4, grossly non-focal ?Psychological:  Alert and cooperative. Normal mood and affect ? ?ASSESSMENT AND PLAN: ?*56 year old male with family history of colon cancer in his brother who is currently 56, diagnosed several years ago.  Patient had rectal bleeding regularly with bowel movements for 4 to 6 weeks.  This was bright red in color, suspect outlet bleeding due to frequent loose stools.  He has been having loose stools since he has been on several new medications, he suspects its medication side effect related.  Hgb normal. ?*CHF with an EF of 30 to 35% and on Coumadin therapy:   Scheduled for colonoscopy at [facility] hospital with Dr. Carlean Purl.  Will hold coumadin for 5 days prior to endoscopic procedures - will instruct when and how to resume after procedure. Benefits and risks of procedure explained including risks of bleeding, perforation, infection, missed lesions, reactions to medications and possible need for hospitalization and surgery for complications. Additional rare but real risk of stroke or other vascular clotting events off of coumadin also explained and need to seek urgent help if any signs of these problems occur. Will communicate by phone or EMR with patient's prescribing provider, Dr. Aundra Dubin, to confirm that holding Coumadin is reasonable in this case.   ? ? ?CC:  [name], MD ? ?  ?

## 2021-04-30 NOTE — Progress Notes (Signed)
Ok to hold warfarin for colonoscopy.  ?

## 2021-04-30 NOTE — Telephone Encounter (Signed)
Request for surgical clearance:     Endoscopy Procedure ? ?What type of surgery is being performed?     Endo/colon ? ?When is this surgery scheduled?     06/20/21 ? ?What type of clearance is required ?   Pharmacy ? ?Are there any medications that need to be held prior to surgery and how long? Coumadin x 5 days ? ?Practice name and name of physician performing surgery?      [facility] ? ?What is your office phone and fax number?      Phone- [phone]  Fax- [phone] ? ?Anesthesia type (None, local, MAC, general) ?       [facility] ? ?

## 2021-04-30 NOTE — Telephone Encounter (Signed)
Progress Notes by [name], MD at 04/30/2021 11:30 AM ?Version 1 of 1 ?Author: [name], MD Service: Heart Failure Author Type: Physician  ?Filed: 04/30/2021  2:46 PM Encounter Date: 04/30/2021 Status: Signed  ?Editor: [name], MD (Physician)  ?Ok to hold warfarin for colonoscopy.   ?  ? ?

## 2021-04-30 NOTE — Patient Instructions (Signed)
You have been scheduled for a colonoscopy. Please follow written instructions given to you at your visit today.  ?Please pick up your prep supplies at the pharmacy within the next 1-3 days. ?If you use inhalers (even only as needed), please bring them with you on the day of your procedure. ? ?If you are age 56 or older, your body mass index should be between 23-30. Your Body mass index is 30.04 kg/m?Marland Kitchen If this is out of the aforementioned range listed, please consider follow up with your Primary Care Provider. ? ?If you are age 13 or younger, your body mass index should be between 19-25. Your Body mass index is 30.04 kg/m?Marland Kitchen If this is out of the aformentioned range listed, please consider follow up with your Primary Care Provider.  ? ?________________________________________________________ ? ?The [facility] providers would like to encourage you to use [facility] to communicate with providers for non-urgent requests or questions.  Due to long hold times on the telephone, sending your provider a message by [facility] may be a faster and more efficient way to get a response.  Please allow 48 business hours for a response.  Please remember that this is for non-urgent requests.  ?_______________________________________________________ ? ?Due to recent changes in healthcare laws, you may see the results of your imaging and laboratory studies on MyChart before your provider has had a chance to review them.  We understand that in some cases there may be results that are confusing or concerning to you. Not all laboratory results come back in the same time frame and the provider may be waiting for multiple results in order to interpret others.  Please give [place] 48 hours in order for your provider to thoroughly review all the results before contacting the office for clarification of your results.  ? ?

## 2021-05-01 ENCOUNTER — Other Ambulatory Visit: Payer: Self-pay

## 2021-05-06 ENCOUNTER — Telehealth (INDEPENDENT_AMBULATORY_CARE_PROVIDER_SITE_OTHER): Payer: Medicaid Other | Admitting: Psychiatry

## 2021-05-06 ENCOUNTER — Other Ambulatory Visit: Payer: Self-pay

## 2021-05-06 DIAGNOSIS — F331 Major depressive disorder, recurrent, moderate: Secondary | ICD-10-CM | POA: Diagnosis not present

## 2021-05-06 DIAGNOSIS — F411 Generalized anxiety disorder: Secondary | ICD-10-CM

## 2021-05-06 MED ORDER — MIRTAZAPINE 45 MG PO TABS
ORAL_TABLET | ORAL | 1 refills | Status: DC
  Filled 2021-05-06: qty 30, fill #0
  Filled 2021-05-19: qty 30, 30d supply, fill #0
  Filled 2021-06-16: qty 30, 30d supply, fill #1

## 2021-05-06 MED ORDER — HYDROXYZINE HCL 10 MG PO TABS
ORAL_TABLET | Freq: Three times a day (TID) | ORAL | 1 refills | Status: DC | PRN
  Filled 2021-05-06: qty 90, fill #0
  Filled 2021-05-19: qty 90, 30d supply, fill #0
  Filled 2021-06-16: qty 90, 30d supply, fill #1

## 2021-05-06 MED ORDER — SERTRALINE HCL 100 MG PO TABS
ORAL_TABLET | ORAL | 1 refills | Status: DC
  Filled 2021-05-06: qty 45, fill #0
  Filled 2021-05-19: qty 45, 30d supply, fill #0
  Filled 2021-06-16: qty 45, 30d supply, fill #1

## 2021-05-06 NOTE — Progress Notes (Signed)
BH MD/PA/NP OP Progress Note ? ?05/06/2021 5:01 PM ?Craig Flores  ?MRN:  [id number] ? ?Virtual Visit via Video Note ? ?I connected with Craig Flores on 05/06/21 at  5:00 PM EDT by a video enabled telemedicine application and verified that I am speaking with the correct person using two identifiers. ? ?Location: ?Patient: home ?Provider: off site ?  ?I discussed the limitations of evaluation and management by telemedicine and the availability of in person appointments. The patient expressed understanding and agreed to proceed. ? ?  ?I discussed the assessment and treatment plan with the patient. The patient was provided an opportunity to ask questions and all were answered. The patient agreed with the plan and demonstrated an understanding of the instructions. ?  ?The patient was advised to call back or seek an in-person evaluation if the symptoms worsen or if the condition fails to improve as anticipated. ? ?I provided 5 minutes of non-face-to-face time during this encounter. ? ? ?Franne Grip, NP  ? ?Chief Complaint: No chief complaint on file. ? ?HPI: Craig Flores is a 56 year old male presenting to [facility] behavioral health outpatient for follow-up psychiatric evaluation.  Patient has a psychiatric history of generalized anxiety disorder, and major depressive disorder, and alcohol abuse.  Patient symptoms are managed with Zoloft 150 mg daily, mirtazapine 45 mg at bedtime, and hydroxyzine 10 mg 3 times daily as needed for anxiety.  Patient reports medication compliance and states that his medications are effective with managing his symptoms.  Patient denies adverse medication effects or the need for dosage adjustment today.  No medication changes today. ? ? ?Visit Diagnosis: No diagnosis found. ? ?Past Psychiatric History:  ? ?Past Medical History:  ?Past Medical History:  ?Diagnosis Date  ? Acid reflux   ? Anxiety   ? CHF (congestive heart failure) ([place])   ? Depression   ? GERD (gastroesophageal  reflux disease)   ? HLD (hyperlipidemia)   ? Migraines   ? Myocardial infarction [facility])   ?  ?Past Surgical History:  ?Procedure Laterality Date  ? CORONARY/GRAFT ACUTE MI REVASCULARIZATION N/A 08/28/2019  ? Procedure: Coronary/Graft Acute MI Revascularization;  Surgeon: [name], MD;  Location: [facility];  Service: Cardiovascular;  Laterality: N/A;  ? ICD IMPLANT N/A 11/28/2019  ? Procedure: ICD IMPLANT;  Surgeon: Deboraha Sprang, MD;  Location: [place] [facility];  Service: Cardiovascular;  Laterality: N/A;  ? LEFT HEART CATH AND CORONARY ANGIOGRAPHY N/A 08/28/2019  ? Procedure: LEFT HEART CATH AND CORONARY ANGIOGRAPHY;  Surgeon: [name], MD;  Location: [place] [facility];  Service: Cardiovascular;  Laterality: N/A;  ? ? ?Family Psychiatric History: None known ? ?Family History:  ?Family History  ?Problem Relation Age of Onset  ? Cancer Mother   ?     peritoneal  ? Arrhythmia Father   ? Prostate cancer Father   ? Colon cancer Brother 66  ? Cancer Maternal Grandmother   ?     type unknown  ? Heart attack Maternal Grandfather   ? Diabetes Paternal Grandmother   ? Heart attack Paternal Grandfather   ? ? ?Social History:  ?Social History  ? ?Socioeconomic History  ? Marital status: Married  ?  Spouse name: Not on file  ? Number of children: 0  ? Years of education: Not on file  ? Highest education level: Not on file  ?Occupational History  ? Occupation: Probation officer  ?Tobacco Use  ? Smoking status: Some Days  ?  Packs/day: 0.50  ?  Types: Cigarettes  ? Smokeless tobacco: Never  ? Tobacco comments:  ?  x15 yrs as of 2021  ?Vaping Use  ? Vaping Use: Never used  ?Substance and Sexual Activity  ? Alcohol use: Yes  ?  Comment: a couple of drinks on the weekends  ? Drug use: Never  ? Sexual activity: Yes  ?  Birth control/protection: None  ?Other Topics Concern  ? Not on file  ?Social History Narrative  ? 2 adopted children  ? ?Social Determinants of Health  ? ?Financial Resource Strain: Not on file  ?Food  Insecurity: Not on file  ?Transportation Needs: Not on file  ?Physical Activity: Not on file  ?Stress: Not on file  ?Social Connections: Not on file  ? ? ?Allergies: No Known Allergies ? ?Metabolic Disorder Labs: ?Lab Results  ?Component Value Date  ? HGBA1C 5.8 (H) 08/28/2019  ? MPG 119.76 08/28/2019  ? ?No results found for: PROLACTIN ?Lab Results  ?Component Value Date  ? CHOL 126 02/18/2021  ? TRIG 99 02/18/2021  ? HDL 40 (L) 02/18/2021  ? CHOLHDL 3.2 02/18/2021  ? VLDL 20 02/18/2021  ? [place] 66 02/18/2021  ? Plain 42 01/03/2021  ? ?Lab Results  ?Component Value Date  ? TSH 3.181 08/28/2019  ? ? ?Therapeutic Level Labs: ?No results found for: LITHIUM ?No results found for: VALPROATE ?No components found for:  CBMZ ? ?Current Medications: ?Current Outpatient Medications  ?Medication Sig Dispense Refill  ? acetaminophen (TYLENOL) 500 MG tablet Take 500 mg by mouth every 6 (six) hours as needed for headache.    ? atorvastatin (LIPITOR) 80 MG tablet Take 1 tablet (80 mg total) by mouth daily. 30 tablet 11  ? carvedilol (COREG) 12.5 MG tablet Take 1.5 tablets (18.75 mg total) by mouth 2 (two) times daily with a meal. 60 tablet 11  ? dapagliflozin propanediol (FARXIGA) 10 MG TABS tablet TAKE 1 TABLET (10 MG TOTAL) BY MOUTH DAILY BEFORE BREAKFAST. 30 tablet 11  ? digoxin (LANOXIN) 0.125 MG tablet Take 0.5 tablets (0.0625 mg total) by mouth daily. 30 tablet 6  ? ezetimibe (ZETIA) 10 MG tablet Take 1 tablet (10 mg total) by mouth daily. 90 tablet 3  ? fenofibrate (TRICOR) 145 MG tablet Take 1 tablet (145 mg total) by mouth daily. 30 tablet 3  ? furosemide (LASIX) 20 MG tablet Take 1 tablet (20 mg total) by mouth every other day. 45 tablet 3  ? hydrOXYzine (ATARAX) 10 MG tablet TAKE 1 TABLET (10 MG TOTAL) BY MOUTH 3 (THREE) TIMES DAILY AS NEEDED. 90 tablet 3  ? mirtazapine (REMERON) 45 MG tablet TAKE 1 TABLET (45 MG TOTAL) BY MOUTH AT BEDTIME. 30 tablet 3  ? Multiple Vitamin (MULTIVITAMIN WITH MINERALS) TABS tablet  Take 1 tablet by mouth daily.    ? omega-3 acid ethyl esters (LOVAZA) 1 g capsule TAKE 2 CAPSULES (2 G TOTAL) BY MOUTH TWO TIMES DAILY. 120 capsule 3  ? omeprazole (PRILOSEC) 20 MG capsule Take 20 mg by mouth as needed.    ? PEG-KCl-NaCl-NaSulf-Na Asc-C (PLENVU) 140 g SOLR Take 1 kit by mouth as directed. 1 each 0  ? sacubitril-valsartan (ENTRESTO) 97-103 MG Take 1 tablet by mouth 2 (two) times daily. 60 tablet 11  ? sertraline (ZOLOFT) 100 MG tablet TAKE 1.5 TABLETS (150 MG TOTAL) BY MOUTH DAILY. 45 tablet 3  ? SILDENAFIL CITRATE PO Take 1-2 tablets by mouth daily as needed (ED). 30 mg tablet - take 1 -  2 tablets 30 to 45 min before sexual activity as needed    ? spironolactone (ALDACTONE) 25 MG tablet TAKE 1 TABLET (25 MG TOTAL) BY MOUTH DAILY. 30 tablet 11  ? warfarin (COUMADIN) 5 MG tablet TAKE 1 AND 1/2 TABLETS TO 2 TABLETS BY MOUTH DAILY AS DIRECTED BY THE COUMADIN CLINIC. 55 tablet 1  ? ?No current facility-administered medications for this visit.  ? ? ? ?Musculoskeletal: ?Strength & Muscle Tone: N/A virtual visit ?Gait & Station: N/A virtual visit ?Patient leans: N/A virtual visit ? ?Psychiatric Specialty Exam: ?Review of Systems  ?Psychiatric/Behavioral:  Negative for hallucinations, self-injury and suicidal ideas.   ?All other systems reviewed and are negative.  ?There were no vitals taken for this visit.There is no height or weight on file to calculate BMI.  ?General Appearance: Well-groomed  ?Eye Contact:  Good  ?Speech:  Clear and Coherent  ?Volume:  Normal  ?Mood:  Euthymic  ?Affect:  Congruent  ?Thought Process:  Goal Directed  ?Orientation:  Full (Time, Place, and Person)  ?Thought Content: Logical   ?Suicidal Thoughts:  No  ?Homicidal Thoughts:  No  ?Memory: Good  ?Judgement: Good  ?Insight: Good  ?Psychomotor Activity: N/A  ?Concentration: Good  ?Recall:  Good  ?Fund of Knowledge: Good  ?Language: Good  ?Akathisia:  NA  ?Handed:  Right  ?AIMS (if indicated): not done  ?Assets:  Communication  Skills ?Desire for Improvement  ?ADL's:  Intact  ?Cognition: WNL  ?Sleep:  Good  ? ?Screenings: ?AIMS   ? ?Flowsheet Row Admission (Discharged) from 04/26/2014 in [place] [facility]  ?AIM

## 2021-05-07 ENCOUNTER — Other Ambulatory Visit: Payer: Self-pay

## 2021-05-13 ENCOUNTER — Other Ambulatory Visit: Payer: Self-pay

## 2021-05-20 ENCOUNTER — Other Ambulatory Visit: Payer: Self-pay

## 2021-05-27 ENCOUNTER — Other Ambulatory Visit: Payer: Self-pay | Admitting: Cardiology

## 2021-05-27 ENCOUNTER — Other Ambulatory Visit: Payer: Self-pay

## 2021-05-27 DIAGNOSIS — I513 Intracardiac thrombosis, not elsewhere classified: Secondary | ICD-10-CM

## 2021-05-27 MED ORDER — WARFARIN SODIUM 5 MG PO TABS
ORAL_TABLET | ORAL | 5 refills | Status: DC
  Filled 2021-05-27: qty 55, 30d supply, fill #0
  Filled 2021-06-23: qty 55, 30d supply, fill #1

## 2021-05-27 NOTE — Telephone Encounter (Signed)
Received refill request for warfarin: ? ?Last INR was 2.6 on 04/25/21 ?Next INR due on 05/30/21 ?Last OV 03/26/21  Einar Crow MD ? ?Refill approved. ?

## 2021-05-28 ENCOUNTER — Ambulatory Visit (INDEPENDENT_AMBULATORY_CARE_PROVIDER_SITE_OTHER): Payer: Medicaid Other

## 2021-05-28 DIAGNOSIS — I255 Ischemic cardiomyopathy: Secondary | ICD-10-CM | POA: Diagnosis not present

## 2021-05-28 LAB — CUP PACEART REMOTE DEVICE CHECK
Battery Remaining Longevity: 168 mo
Battery Remaining Percentage: 100 %
Brady Statistic RV Percent Paced: 0 %
Date Time Interrogation Session: 20230502011400
HighPow Impedance: 81 Ohm
Implantable Lead Implant Date: 20211101
Implantable Lead Location: 753860
Implantable Lead Model: 138
Implantable Lead Serial Number: 303500
Implantable Pulse Generator Implant Date: 20211101
Lead Channel Impedance Value: 461 Ohm
Lead Channel Setting Pacing Amplitude: 3.5 V
Lead Channel Setting Pacing Pulse Width: 0.4 ms
Lead Channel Setting Sensing Sensitivity: 0.5 mV
Pulse Gen Serial Number: 209902

## 2021-06-03 ENCOUNTER — Other Ambulatory Visit: Payer: Self-pay

## 2021-06-05 ENCOUNTER — Other Ambulatory Visit: Payer: Self-pay

## 2021-06-06 ENCOUNTER — Other Ambulatory Visit: Payer: Self-pay

## 2021-06-09 ENCOUNTER — Other Ambulatory Visit (HOSPITAL_COMMUNITY): Payer: Self-pay | Admitting: Cardiology

## 2021-06-10 ENCOUNTER — Encounter (HOSPITAL_COMMUNITY): Payer: Self-pay

## 2021-06-10 ENCOUNTER — Ambulatory Visit (HOSPITAL_COMMUNITY)
Admission: RE | Admit: 2021-06-10 | Discharge: 2021-06-10 | Disposition: A | Discharge status: Home / Self Care | Payer: Medicaid Other | Source: Ambulatory Visit | Attending: Family Medicine | Admitting: Family Medicine

## 2021-06-10 ENCOUNTER — Encounter: Payer: Self-pay | Admitting: Cardiology

## 2021-06-10 ENCOUNTER — Other Ambulatory Visit: Payer: Self-pay

## 2021-06-10 VITALS — BP 90/64 | HR 54 | Wt 192.4 lb

## 2021-06-10 DIAGNOSIS — Z79899 Other long term (current) drug therapy: Secondary | ICD-10-CM | POA: Diagnosis not present

## 2021-06-10 DIAGNOSIS — I252 Old myocardial infarction: Secondary | ICD-10-CM | POA: Diagnosis not present

## 2021-06-10 DIAGNOSIS — R55 Syncope and collapse: Secondary | ICD-10-CM | POA: Diagnosis not present

## 2021-06-10 DIAGNOSIS — Z7901 Long term (current) use of anticoagulants: Secondary | ICD-10-CM | POA: Insufficient documentation

## 2021-06-10 DIAGNOSIS — E785 Hyperlipidemia, unspecified: Secondary | ICD-10-CM | POA: Insufficient documentation

## 2021-06-10 DIAGNOSIS — I2510 Atherosclerotic heart disease of native coronary artery without angina pectoris: Secondary | ICD-10-CM | POA: Insufficient documentation

## 2021-06-10 DIAGNOSIS — I255 Ischemic cardiomyopathy: Secondary | ICD-10-CM | POA: Diagnosis not present

## 2021-06-10 DIAGNOSIS — E782 Mixed hyperlipidemia: Secondary | ICD-10-CM | POA: Diagnosis not present

## 2021-06-10 DIAGNOSIS — Z86718 Personal history of other venous thrombosis and embolism: Secondary | ICD-10-CM | POA: Diagnosis not present

## 2021-06-10 DIAGNOSIS — I236 Thrombosis of atrium, auricular appendage, and ventricle as current complications following acute myocardial infarction: Secondary | ICD-10-CM

## 2021-06-10 DIAGNOSIS — F17210 Nicotine dependence, cigarettes, uncomplicated: Secondary | ICD-10-CM | POA: Diagnosis not present

## 2021-06-10 DIAGNOSIS — I5022 Chronic systolic (congestive) heart failure: Secondary | ICD-10-CM

## 2021-06-10 DIAGNOSIS — R42 Dizziness and giddiness: Secondary | ICD-10-CM | POA: Diagnosis not present

## 2021-06-10 DIAGNOSIS — K921 Melena: Secondary | ICD-10-CM | POA: Insufficient documentation

## 2021-06-10 DIAGNOSIS — Z720 Tobacco use: Secondary | ICD-10-CM

## 2021-06-10 DIAGNOSIS — Z7982 Long term (current) use of aspirin: Secondary | ICD-10-CM | POA: Insufficient documentation

## 2021-06-10 DIAGNOSIS — Z95810 Presence of automatic (implantable) cardiac defibrillator: Secondary | ICD-10-CM | POA: Diagnosis not present

## 2021-06-10 LAB — BASIC METABOLIC PANEL
Anion gap: 9 (ref 5–15)
BUN: 29 mg/dL — ABNORMAL HIGH (ref 6–20)
CO2: 24 mmol/L (ref 22–32)
Calcium: 9.5 mg/dL (ref 8.9–10.3)
Chloride: 106 mmol/L (ref 98–111)
Creatinine, Ser: 1.17 mg/dL (ref 0.61–1.24)
GFR, Estimated: >60 mL/min (ref >60)
Glucose, Bld: 134 mg/dL — ABNORMAL HIGH (ref 70–99)
Potassium: 4.3 mmol/L (ref 3.5–5.1)
Sodium: 139 mmol/L (ref 135–145)

## 2021-06-10 LAB — DIGOXIN LEVEL: Digoxin Level: 0.3 ng/mL — ABNORMAL LOW (ref 0.8–2.0)

## 2021-06-10 MED ORDER — CARVEDILOL 12.5 MG PO TABS
12.5000 mg | ORAL_TABLET | Freq: Two times a day (BID) | ORAL | 6 refills | Status: DC
  Filled 2021-06-10 (×2): qty 60, 30d supply, fill #0
  Filled 2021-07-21: qty 60, 30d supply, fill #1
  Filled 2021-08-18: qty 60, 30d supply, fill #2
  Filled 2021-09-15: qty 60, 30d supply, fill #3
  Filled 2021-10-15: qty 60, 30d supply, fill #4
  Filled 2021-11-15: qty 60, 30d supply, fill #5
  Filled 2021-12-15: qty 60, 30d supply, fill #6

## 2021-06-10 MED ORDER — CHANTIX STARTING MONTH PAK 0.5 MG X 11 & 1 MG X 42 PO TBPK
ORAL_TABLET | ORAL | 3 refills | Status: AC
  Filled 2021-06-10: qty 60, fill #0

## 2021-06-10 MED ORDER — FENOFIBRATE 145 MG PO TABS
145.0000 mg | ORAL_TABLET | Freq: Every day | ORAL | 3 refills | Status: DC
  Filled 2021-06-10: qty 30, 30d supply, fill #0
  Filled 2021-07-07: qty 30, 30d supply, fill #1
  Filled 2021-08-06: qty 30, 30d supply, fill #2
  Filled 2021-09-03: qty 30, 30d supply, fill #3

## 2021-06-10 NOTE — Patient Instructions (Signed)
Thank you for coming in today ? ?Labs were done today, if any labs are abnormal the clinic will call you ?No news is good news ? ?Your physician recommends that you schedule a follow-up appointment in:  ?3 months with Dr. Aundra Dubin ? ?DECREASE Coreg to 12.5 mg twice daily  ? ?START Chantix starter pack ? ?  ?Do the following things EVERYDAY: ?Weigh yourself in the morning before breakfast. Write it down and keep it in a log. ?Take your medicines as prescribed ?Eat low salt foods--Limit salt (sodium) to 2000 mg per day.  ?Stay as active as you can everyday ?Limit all fluids for the day to less than 2 liters  ? ?At the [facility], you and your health needs are our priority. As part of our continuing mission to provide you with exceptional heart care, we have created designated Provider Care Teams. These Care Teams include your primary Cardiologist (physician) and Advanced Practice Providers (APPs- Physician Assistants and Nurse Practitioners) who all work together to provide you with the care you need, when you need it.  ? ?You may see any of the following providers on your designated Care Team at your next follow up: ?Dr Glori Bickers ?Dr Loralie Champagne ?Darrick Grinder, NP ?Lyda Jester, PA ?Jessica Milford,NP ?Marlyce Huge, PA ?Audry Riles, PharmD ? ? ?Please be sure to bring in all your medications bottles to every appointment.   ? ?If you have any questions or concerns before your next appointment please send [place] a message through [place] or call our office at [phone].   ? ?TO LEAVE A MESSAGE FOR THE NURSE SELECT OPTION 2, PLEASE LEAVE A MESSAGE INCLUDING: ?YOUR NAME ?DATE OF BIRTH ?CALL BACK NUMBER ?REASON FOR CALL**this is important as we prioritize the call backs ? ?YOU WILL RECEIVE A CALL BACK THE SAME DAY AS LONG AS YOU CALL BEFORE 4:00 PM ? ?

## 2021-06-10 NOTE — Progress Notes (Addendum)
?Advanced Heart Failure Clinic Note  ? ?PCP: Pcp, No ?HF Cardiology: Dr. Aundra Dubin ? ?Reason for Visit: Heart Failure  ?  ?HPI: ?56 y.o. male smoker was admitted on 08/28/19 for delayed presentation anterior STEMI. Had had >36 hr of chest pain prior to seeking medical attention. Pain progressed to more pleuritic like CP. Found to have anterior ST elevations w/ precordial Q waves on admit. Hs troponin >27,000. Urgent cardiac cath showed totally occluded mid LAD w/o collaterals and 40% mid RCA stenosis. It was suspected he had completed his infarct and residual pleuritic CP was post-MI pericarditis. No intervention was performed. 2D echo demonstrated moderately reduced LVEF, 45-50%, + apical thrombus and moderate AS. MV normal. RV systolic function mildly reduced.  ?  ?He was placed on medical management for CAD and systolic HF. He was started on ASA, atorvastatin, Losartan and Coreg. Coumadin started for apical thrombus w/ heparin bridge.  ? ?Post cath, there were initial concerns for developing cardiogenic shock but he remained stable and did not require pressor/inotropic support. Co-ox remained stable. Repeat echo showed further reduction of LVEF down to 30-35% but no post MI mechanical complications. RV mildly reduced. He did require IV Lasix for pulmonary edema and volume status and dyspnea improved w/ diuresis. He was continued on GTDMT w/ losartan, spironolactone and Coreg. BP too soft for Entresto. He was treated w/ colchicine for post MI pericarditis w/ improvement in pleuritic chest pain. Given anterior MI w/ EF <35%, he was fitted w/ a LifeVest prior to discharge w/ plans to get repeat echo in 1 month.  ? ?He had repeat echo in 9/21, showing EF 25-30%.  He had Cache placed in 11/21.  Echo in 8/22 showed EF 30% with no LV thrombus, normal RV, normal IVC, moderate AS with mean gradient 28 mmHg and AVA 1.08 cm^2. Echo in 12/22 showed EF 30-35%, no LV thrombus, possibly bicuspid aortic valve with  mean gradient 34 mmHg and AVA 1.02 cm^2, normal RV, IVC normal.  ? ?Follow up 2/23, stable symptoms, remained tobacco-free. Had BRBPR and referred to GI.  ? ?Today he returns for HF follow up. Overall feeling fine. Main complaint is regular dizziness, requiring him to sit down to prevent from passing out. Has been on-going x2-3 months. He continues to walk on treadmill at a moderate pace without significant dyspnea. Bleeding in stool has subsided some. GI planning colonoscopy next month.  Denies palpitations, CP, edema, or PND/Orthopnea. Appetite ok. No fever or chills. Weight at home 186 pounds. Taking all medications. Back smoking < 1/2 ppd. ? ?ECG (personally reviewed): NSR 76 bpm ? ?[employer] interrogation (personally reviewed): HL Score 5, 2.2 hr/day activity, average  HR 71 ? ?Labs (9/21): K 4.3, creatinine 0.93 ?Labs (11/21): LDL 67, HDL 37, K 4.4, creatinine 0.81 ?Labs (1/22): digoxin 0.5, K 4, creatinine 1.1.  ?Labs (5/22): digoxin 0.9, K 4.4, creatinine 1.06 ?Labs (8/22): K 4.3, creatinine 0.91, TGs 343, LDL 65, HDL 31, digoxin 1.0 ?Labs (12/22): K 4.1, creatinine 1.14, pro-BNP 120 ?Labs (1/23): hgb 14.7, LDL 66, TGs 99 ?Labs (3/23): K 4.9, creatinine 1.29 ? ?PMH: ?1. GERD ?2. Depression  ?3. CAD: Anterior STEMI in 8/21 with totally occluded LAD/no collaterals, 40% mid RCA.  Due to late presentation, no PCI was done.  ?- Had post-infarct pericarditis.  ?4. Chronic systolic CHF: Ischemic cardiomyopathy. [place].  ?- Echo (9/21) with EF 25-30%, normal RV, moderate AS with mean gradient 22 mmHg.  ?- Echo (8/22): EF 30%  with no LV thrombus, normal RV, normal IVC, moderate AS with mean gradient 28 mmHg and AVA 1.08 cm^2.  ?- Echo (12/22): EF 30-35%, no LV thrombus, possibly bicuspid aortic valve with mean gradient 34 mmHg and AVA 1.02 cm^2, normal RV, IVC normal.  ?5. LV thrombus: Apical thrombus noted on echo at time of MI in 8/21. Resolved by 9/21 echo.  ?6. Aortic stenosis: Moderate on  12/22 echo. Marland Kitchen  ?7. Active smoker.  ? ?Current Outpatient Medications  ?Medication Sig Dispense Refill  ? acetaminophen (TYLENOL) 500 MG tablet Take 500 mg by mouth every 6 (six) hours as needed for headache.    ? atorvastatin (LIPITOR) 80 MG tablet Take 1 tablet (80 mg total) by mouth daily. 30 tablet 11  ? carvedilol (COREG) 12.5 MG tablet Take 1.5 tablets (18.75 mg total) by mouth 2 (two) times daily with a meal. 60 tablet 11  ? dapagliflozin propanediol (FARXIGA) 10 MG TABS tablet TAKE 1 TABLET (10 MG TOTAL) BY MOUTH DAILY BEFORE BREAKFAST. 30 tablet 11  ? digoxin (LANOXIN) 0.125 MG tablet Take 1/2 tablet (0.0625 mg total) by mouth daily. 30 tablet 6  ? ezetimibe (ZETIA) 10 MG tablet Take 1 tablet (10 mg total) by mouth daily. 90 tablet 3  ? fenofibrate (TRICOR) 145 MG tablet Take 1 tablet (145 mg total) by mouth daily. 30 tablet 3  ? furosemide (LASIX) 20 MG tablet Take 1 tablet (20 mg total) by mouth every other day. 45 tablet 3  ? hydrOXYzine (ATARAX) 10 MG tablet TAKE 1 TABLET (10 MG TOTAL) BY MOUTH 3 (THREE) TIMES DAILY AS NEEDED. 90 tablet 1  ? mirtazapine (REMERON) 45 MG tablet TAKE 1 TABLET (45 MG TOTAL) BY MOUTH AT BEDTIME. 30 tablet 1  ? Multiple Vitamin (MULTIVITAMIN WITH MINERALS) TABS tablet Take 1 tablet by mouth daily.    ? omega-3 acid ethyl esters (LOVAZA) 1 g capsule TAKE 2 CAPSULES (2 G TOTAL) BY MOUTH TWO TIMES DAILY. 120 capsule 3  ? omeprazole (PRILOSEC) 20 MG capsule Take 20 mg by mouth as needed.    ? sacubitril-valsartan (ENTRESTO) 97-103 MG Take 1 tablet by mouth 2 (two) times daily. 60 tablet 11  ? sertraline (ZOLOFT) 100 MG tablet TAKE 1.5 TABLETS (150 MG TOTAL) BY MOUTH DAILY. 45 tablet 1  ? SILDENAFIL CITRATE PO Take 1-2 tablets by mouth daily as needed (ED). 30 mg tablet - take 1 - 2 tablets 30 to 45 min before sexual activity as needed    ? spironolactone (ALDACTONE) 25 MG tablet TAKE 1 TABLET (25 MG TOTAL) BY MOUTH DAILY. 30 tablet 11  ? warfarin (COUMADIN) 5 MG tablet TAKE 1 AND  1/2 TABLETS TO 2 TABLETS BY MOUTH DAILY AS DIRECTED BY THE COUMADIN CLINIC. 55 tablet 5  ? PEG-KCl-NaCl-NaSulf-Na Asc-C (PLENVU) 140 g SOLR Take 1 kit by mouth as directed. (Patient not taking: Reported on 06/10/2021) 1 each 0  ? ?No current facility-administered medications for this encounter.  ? ?No Known Allergies ? ?Social History  ? ?Socioeconomic History  ? Marital status: Married  ?  Spouse name: Not on file  ? Number of children: 0  ? Years of education: Not on file  ? Highest education level: Not on file  ?Occupational History  ? Occupation: Probation officer  ?Tobacco Use  ? Smoking status: Former  ?  Packs/day: 0.50  ?  Types: Cigarettes  ? Smokeless tobacco: Never  ? Tobacco comments:  ?  x15 yrs as of 2021  ?Vaping Use  ?  Vaping Use: Never used  ?Substance and Sexual Activity  ? Alcohol use: Yes  ?  Comment: a couple of drinks on the weekends  ? Drug use: Never  ? Sexual activity: Yes  ?  Birth control/protection: None  ?Other Topics Concern  ? Not on file  ?Social History Narrative  ? 2 adopted children  ? ?Social Determinants of Health  ? ?Financial Resource Strain: Not on file  ?Food Insecurity: Not on file  ?Transportation Needs: Not on file  ?Physical Activity: Not on file  ?Stress: Not on file  ?Social Connections: Not on file  ?Intimate Partner Violence: Not on file  ? ?Family History  ?Problem Relation Age of Onset  ? Cancer Mother   ?     peritoneal  ? Arrhythmia Father   ? Prostate cancer Father   ? Colon cancer Brother 33  ? Cancer Maternal Grandmother   ?     type unknown  ? Heart attack Maternal Grandfather   ? Diabetes Paternal Grandmother   ? Heart attack Paternal Grandfather   ? ?BP 90/64   Pulse (!) 54   Wt 87.3 kg (192 lb 6.4 oz)   SpO2 96%   BMI 28.83 kg/m?  ? ?Wt Readings from Last 3 Encounters:  ?06/10/21 87.3 kg (192 lb 6.4 oz)  ?04/30/21 90.9 kg (200 lb 8 oz)  ?03/26/21 92.8 kg (204 lb 9.6 oz)  ? ?PHYSICAL EXAM: ?General:  NAD. No resp difficulty ?HEENT: Normal ?Neck: Supple. No JVD.  Carotids 2+ bilat; no bruits. No lymphadenopathy or thryomegaly appreciated. ?Cor: PMI nondisplaced. Regular rate & rhythm. No rubs, gallops, 2/6 SEM RUSB ?Lungs: Clear ?Abdomen: Soft, nontender, nondistended.

## 2021-06-12 NOTE — Progress Notes (Signed)
Remote ICD transmission.   

## 2021-06-17 ENCOUNTER — Other Ambulatory Visit: Payer: Self-pay

## 2021-06-25 ENCOUNTER — Other Ambulatory Visit: Payer: Self-pay

## 2021-07-01 ENCOUNTER — Other Ambulatory Visit: Payer: Self-pay

## 2021-07-04 ENCOUNTER — Encounter (HOSPITAL_COMMUNITY): Payer: Self-pay | Admitting: Internal Medicine

## 2021-07-04 NOTE — Progress Notes (Signed)
Attempted to obtain medical history via telephone, unable to reach at this time. HIPAA compliant voicemail message left requesting return call to pre surgical testing department. 

## 2021-07-07 ENCOUNTER — Other Ambulatory Visit: Payer: Self-pay | Admitting: Cardiology

## 2021-07-08 ENCOUNTER — Other Ambulatory Visit: Payer: Self-pay

## 2021-07-08 MED ORDER — OMEGA-3-ACID ETHYL ESTERS 1 G PO CAPS
ORAL_CAPSULE | ORAL | 3 refills | Status: DC
Start: 2021-07-08 — End: 2021-11-08
  Filled 2021-07-08: qty 120, 30d supply, fill #0
  Filled 2021-08-06: qty 120, 30d supply, fill #1
  Filled 2021-09-03: qty 120, 30d supply, fill #2
  Filled 2021-10-06: qty 120, 30d supply, fill #3

## 2021-07-09 ENCOUNTER — Other Ambulatory Visit: Payer: Self-pay

## 2021-07-10 ENCOUNTER — Other Ambulatory Visit: Payer: Self-pay

## 2021-07-14 ENCOUNTER — Other Ambulatory Visit (HOSPITAL_COMMUNITY): Payer: Self-pay

## 2021-07-15 ENCOUNTER — Other Ambulatory Visit: Payer: Self-pay

## 2021-07-15 NOTE — Anesthesia Preprocedure Evaluation (Signed)
Anesthesia Evaluation  Patient identified by MRN, date of birth, ID band Patient awake    Reviewed: Allergy & Precautions, NPO status , Patient's Chart, lab work & pertinent test results  Airway Mallampati: II  TM Distance: >3 FB Neck ROM: Full    Dental no notable dental hx. (+) Teeth Intact, Dental Advisory Given   Pulmonary Current Smoker, former smoker,    Pulmonary exam normal breath sounds clear to auscultation       Cardiovascular hypertension, Pt. on home beta blockers and Pt. on medications + Past MI (STEMI 8/21) and +CHF  Normal cardiovascular exam+ Cardiac Defibrillator + Valvular Problems/Murmurs (mod) AS  Rhythm:Regular Rate:Normal  anterior MI w/ EF <35%   Neuro/Psych Anxiety Depression    GI/Hepatic Neg liver ROS, GERD  ,  Endo/Other    Renal/GU      Musculoskeletal   Abdominal   Peds  Hematology   Anesthesia Other Findings Pt on coumadin hx of Lv thrombuss- resolved  Reproductive/Obstetrics                            Anesthesia Physical Anesthesia Plan  ASA: 4  Anesthesia Plan: MAC   Post-op Pain Management:    Induction:   PONV Risk Score and Plan: Treatment may vary due to age or medical condition  Airway Management Planned: Natural Airway and Nasal Cannula  Additional Equipment: None  Intra-op Plan:   Post-operative Plan:   Informed Consent: I have reviewed the patients History and Physical, chart, labs and discussed the procedure including the risks, benefits and alternatives for the proposed anesthesia with the patient or authorized representative who has indicated his/her understanding and acceptance.     Dental advisory given  Plan Discussed with:   Anesthesia Plan Comments: (rectal bleeding, diarrhea, family hx colon cancer (brother) for Clonoscopy under MAC)       Anesthesia Quick Evaluation

## 2021-07-16 ENCOUNTER — Ambulatory Visit (HOSPITAL_BASED_OUTPATIENT_CLINIC_OR_DEPARTMENT_OTHER): Payer: Medicaid Other | Admitting: Certified Registered Nurse Anesthetist

## 2021-07-16 ENCOUNTER — Other Ambulatory Visit: Payer: Self-pay

## 2021-07-16 ENCOUNTER — Encounter (HOSPITAL_COMMUNITY): Payer: Self-pay | Admitting: Internal Medicine

## 2021-07-16 ENCOUNTER — Encounter (HOSPITAL_COMMUNITY)
Admission: RE | Disposition: A | Discharge status: Home / Self Care | Payer: Self-pay | Source: Home / Self Care | Attending: Internal Medicine

## 2021-07-16 ENCOUNTER — Ambulatory Visit (HOSPITAL_COMMUNITY): Payer: Medicaid Other | Admitting: Certified Registered Nurse Anesthetist

## 2021-07-16 ENCOUNTER — Ambulatory Visit (HOSPITAL_COMMUNITY)
Admission: RE | Admit: 2021-07-16 | Discharge: 2021-07-16 | Disposition: A | Discharge status: Home / Self Care | Payer: Medicaid Other | Attending: Internal Medicine | Admitting: Internal Medicine

## 2021-07-16 DIAGNOSIS — F32A Depression, unspecified: Secondary | ICD-10-CM | POA: Diagnosis not present

## 2021-07-16 DIAGNOSIS — K625 Hemorrhage of anus and rectum: Secondary | ICD-10-CM | POA: Diagnosis not present

## 2021-07-16 DIAGNOSIS — K635 Polyp of colon: Secondary | ICD-10-CM | POA: Insufficient documentation

## 2021-07-16 DIAGNOSIS — I252 Old myocardial infarction: Secondary | ICD-10-CM | POA: Diagnosis not present

## 2021-07-16 DIAGNOSIS — I110 Hypertensive heart disease with heart failure: Secondary | ICD-10-CM | POA: Insufficient documentation

## 2021-07-16 DIAGNOSIS — I429 Cardiomyopathy, unspecified: Secondary | ICD-10-CM | POA: Diagnosis not present

## 2021-07-16 DIAGNOSIS — K5730 Diverticulosis of large intestine without perforation or abscess without bleeding: Secondary | ICD-10-CM | POA: Insufficient documentation

## 2021-07-16 DIAGNOSIS — I509 Heart failure, unspecified: Secondary | ICD-10-CM | POA: Insufficient documentation

## 2021-07-16 DIAGNOSIS — K644 Residual hemorrhoidal skin tags: Secondary | ICD-10-CM | POA: Insufficient documentation

## 2021-07-16 DIAGNOSIS — I513 Intracardiac thrombosis, not elsewhere classified: Secondary | ICD-10-CM

## 2021-07-16 DIAGNOSIS — Z95810 Presence of automatic (implantable) cardiac defibrillator: Secondary | ICD-10-CM | POA: Insufficient documentation

## 2021-07-16 DIAGNOSIS — F419 Anxiety disorder, unspecified: Secondary | ICD-10-CM | POA: Insufficient documentation

## 2021-07-16 DIAGNOSIS — Z7901 Long term (current) use of anticoagulants: Secondary | ICD-10-CM | POA: Insufficient documentation

## 2021-07-16 DIAGNOSIS — K219 Gastro-esophageal reflux disease without esophagitis: Secondary | ICD-10-CM | POA: Insufficient documentation

## 2021-07-16 DIAGNOSIS — K648 Other hemorrhoids: Secondary | ICD-10-CM

## 2021-07-16 DIAGNOSIS — Z79899 Other long term (current) drug therapy: Secondary | ICD-10-CM | POA: Insufficient documentation

## 2021-07-16 DIAGNOSIS — Z800 Family history of malignant neoplasm of digestive organs: Secondary | ICD-10-CM | POA: Insufficient documentation

## 2021-07-16 DIAGNOSIS — D124 Benign neoplasm of descending colon: Secondary | ICD-10-CM

## 2021-07-16 DIAGNOSIS — F418 Other specified anxiety disorders: Secondary | ICD-10-CM | POA: Diagnosis not present

## 2021-07-16 DIAGNOSIS — K5731 Diverticulosis of large intestine without perforation or abscess with bleeding: Secondary | ICD-10-CM | POA: Diagnosis not present

## 2021-07-16 DIAGNOSIS — R197 Diarrhea, unspecified: Secondary | ICD-10-CM

## 2021-07-16 HISTORY — PX: COLONOSCOPY WITH PROPOFOL: SHX5780

## 2021-07-16 HISTORY — PX: POLYPECTOMY: SHX5525

## 2021-07-16 SURGERY — COLONOSCOPY WITH PROPOFOL
Anesthesia: Monitor Anesthesia Care

## 2021-07-16 MED ORDER — PROPOFOL 1000 MG/100ML IV EMUL
INTRAVENOUS | Status: AC
  Filled 2021-07-16: qty 100

## 2021-07-16 MED ORDER — LIDOCAINE 2% (20 MG/ML) 5 ML SYRINGE
INTRAMUSCULAR | Status: DC | PRN
  Administered 2021-07-16: 40 mg via INTRAVENOUS

## 2021-07-16 MED ORDER — PHENYLEPHRINE HCL (PRESSORS) 10 MG/ML IV SOLN
INTRAVENOUS | Status: AC
  Filled 2021-07-16: qty 1

## 2021-07-16 MED ORDER — SODIUM CHLORIDE 0.9 % IV SOLN: INTRAVENOUS | Status: DC

## 2021-07-16 MED ORDER — PHENYLEPHRINE 80 MCG/ML (10ML) SYRINGE FOR IV PUSH (FOR BLOOD PRESSURE SUPPORT)
PREFILLED_SYRINGE | INTRAVENOUS | Status: DC | PRN
  Administered 2021-07-16 (×5): 80 ug via INTRAVENOUS

## 2021-07-16 MED ORDER — PROPOFOL 10 MG/ML IV BOLUS
INTRAVENOUS | Status: DC | PRN
  Administered 2021-07-16 (×4): 10 mg via INTRAVENOUS

## 2021-07-16 MED ORDER — LACTATED RINGERS IV SOLN
INTRAVENOUS | Status: DC
  Administered 2021-07-16: 1000 mL via INTRAVENOUS

## 2021-07-16 MED ORDER — PROPOFOL 500 MG/50ML IV EMUL
INTRAVENOUS | Status: DC | PRN
  Administered 2021-07-16: 100 ug/kg/min via INTRAVENOUS

## 2021-07-16 SURGICAL SUPPLY — 22 items

## 2021-07-16 NOTE — Anesthesia Postprocedure Evaluation (Signed)
Anesthesia Post Note  Patient: Craig Flores  Procedure(s) Performed: COLONOSCOPY WITH PROPOFOL POLYPECTOMY     Patient location during evaluation: PACU Anesthesia Type: MAC Level of consciousness: awake and alert Pain management: pain level controlled Vital Signs Assessment: post-procedure vital signs reviewed and stable Respiratory status: spontaneous breathing, nonlabored ventilation, respiratory function stable and patient connected to nasal cannula oxygen Cardiovascular status: blood pressure returned to baseline and stable Postop Assessment: no apparent nausea or vomiting Anesthetic complications: no   No notable events documented.  Last Vitals:  Vitals:   07/16/21 0641 07/16/21 0815  BP: 103/65 (!) 80/50  Pulse: 72 73  Resp: 20 16  Temp: 36.7 C 36.6 C  SpO2: 96% 96%    Last Pain:  Vitals:   07/16/21 0815  TempSrc: Temporal  PainSc: 0-No pain                 Barnet Glasgow

## 2021-07-16 NOTE — Op Note (Signed)
[facility] Patient Name: Craig Flores Procedure Date: 07/16/2021 MRN: [id number] Attending MD: Gatha Mayer , MD Date of Birth: January 30, 1965 CSN: [id number] Age: 56 Admit Type: Outpatient Procedure:                Colonoscopy Indications:              Rectal bleeding Providers:                Gatha Mayer, MD, Ladoris Gene, RN, Despina Pole, Technician, Williemae Area, CRNA Referring MD:              Medicines:                Monitored Anesthesia Care Complications:            No immediate complications. Estimated Blood Loss:     Estimated blood loss was minimal. Procedure:                Pre-Anesthesia Assessment:                           - Prior to the procedure, a History and Physical                            was performed, and patient medications and                            allergies were reviewed. The patient's tolerance of                            previous anesthesia was also reviewed. The risks                            and benefits of the procedure and the sedation                            options and risks were discussed with the patient.                            All questions were answered, and informed consent                            was obtained. Prior Anticoagulants: The patient                            last took Coumadin (warfarin) 5 days prior to the                            procedure. ASA Grade Assessment: IV - A patient                            with severe systemic disease that is a constant  threat to life. After reviewing the risks and                            benefits, the patient was deemed in satisfactory                            condition to undergo the procedure.                           After obtaining informed consent, the colonoscope                            was passed under direct vision. Throughout the                            procedure, the patient's  blood pressure, pulse, and                            oxygen saturations were monitored continuously. The                            CF-HQ190L ([id number]) Olympus colonoscope was                            introduced through the anus and advanced to the the                            cecum, identified by appendiceal orifice and                            ileocecal valve. The colonoscopy was performed                            without difficulty. The patient tolerated the                            procedure well. The quality of the bowel                            preparation was good. The bowel preparation used                            was Planvu via split dose instruction. The                            ileocecal valve, appendiceal orifice, and rectum                            were photographed. Scope In: 7:52:18 AM Scope Out: 8:05:53 AM Scope Withdrawal Time: 0 hours 12 minutes 3 seconds  Total Procedure Duration: 0 hours 13 minutes 35 seconds  Findings:      The perianal and digital rectal examinations were normal. Pertinent       negatives include normal prostate (size, shape, and consistency).  A 2 mm polyp was found in the descending colon. The polyp was flat. The       polyp was removed with a cold snare. Resection and retrieval were       complete. Verification of patient identification for the specimen was       done. Estimated blood loss was minimal.      Scattered small-mouthed diverticula were found in the sigmoid colon and       descending colon.      External and internal hemorrhoids were found.      The exam was otherwise without abnormality on direct and retroflexion       views. Impression:               - One 2 mm polyp in the descending colon, removed                            with a cold snare. Resected and retrieved.                           - Diverticulosis in the sigmoid colon and in the                            descending colon.                            - External and internal hemorrhoids.                           - The examination was otherwise normal on direct                            and retroflexion views.                           - Family history of colon cancer in brother < 4 Moderate Sedation:      Not Applicable - Patient had care per Anesthesia. Recommendation:           - Patient has a contact number available for                            emergencies. The signs and symptoms of potential                            delayed complications were discussed with the                            patient. Return to normal activities tomorrow.                            Written discharge instructions were provided to the                            patient.                           - Resume previous diet.                           -  Continue present medications.                           - Resume Coumadin (warfarin) at prior dose today.                           - Await pathology results.                           - Repeat colonoscopy in 5 years. (Family Hx) Procedure Code(s):        --- Professional ---                           [phone], Colonoscopy, flexible; with removal of                            tumor(s), polyp(s), or other lesion(s) by snare                            technique Diagnosis Code(s):        --- Professional ---                           K63.5, Polyp of colon                           K64.8, Other hemorrhoids                           K62.5, Hemorrhage of anus and rectum                           K57.30, Diverticulosis of large intestine without                            perforation or abscess without bleeding CPT copyright 2019 American Medical Association. All rights reserved. The codes documented in this report are preliminary and upon coder review may  be revised to meet current compliance requirements. Gatha Mayer, MD 07/16/2021 8:20:11 AM This report has been signed electronically. Number of  Addenda: 0

## 2021-07-16 NOTE — Discharge Instructions (Addendum)
I found and removed one tiny polyp that I am certain is benign. Anticipate repeat colonoscopy in 5 years (2028).  I will let you know pathology results.  Also saw diverticulosis and hemorrhoids (cause of bleeding).  OK to restart warfarin today and follow-up with anti-coagulation clinic in 1-2 weeks to recheck INR if not already arranged.  I appreciate the opportunity to care for you. Gatha Mayer, MD, FACG  YOU HAD AN ENDOSCOPIC PROCEDURE TODAY: Refer to the procedure report and other information in the discharge instructions given to you for any specific questions about what was found during the examination. If this information does not answer your questions, please call Dr. Celesta Aver office at [phone] to clarify.   YOU SHOULD EXPECT: Some feelings of bloating in the abdomen. Passage of more gas than usual. Walking can help get rid of the air that was put into your GI tract during the procedure and reduce the bloating. If you had a lower endoscopy (such as a colonoscopy or flexible sigmoidoscopy) you may notice spotting of blood in your stool or on the toilet paper. Some abdominal soreness may be present for a day or two, also.  DIET: Your first meal following the procedure should be a light meal and then it is ok to progress to your normal diet. A half-sandwich or bowl of soup is an example of a good first meal. Heavy or fried foods are harder to digest and may make you feel nauseous or bloated. Drink plenty of fluids but you should avoid alcoholic beverages for 24 hours.   ACTIVITY: Your care partner should take you home directly after the procedure. You should plan to take it easy, moving slowly for the rest of the day. You can resume normal activity the day after the procedure however YOU SHOULD NOT DRIVE, use power tools, machinery or perform tasks that involve climbing or major physical exertion for 24 hours (because of the sedation medicines used during the test).   SYMPTOMS TO  REPORT IMMEDIATELY: A gastroenterologist can be reached at any hour. Please call [phone]  for any of the following symptoms:  Following lower endoscopy (colonoscopy, flexible sigmoidoscopy) Excessive amounts of blood in the stool  Significant tenderness, worsening of abdominal pains  Swelling of the abdomen that is new, acute  Fever of 100 or higher  Following upper endoscopy (EGD, EUS, ERCP, esophageal dilation) Vomiting of blood or coffee ground material  New, significant abdominal pain  New, significant chest pain or pain under the shoulder blades  Painful or persistently difficult swallowing  New shortness of breath  Black, tarry-looking or red, bloody stools  FOLLOW UP:  If any biopsies were taken you will be contacted by phone or by letter within the next 1-3 weeks. Call [phone]  if you have not heard about the biopsies in 3 weeks.  Please also call with any specific questions about appointments or follow up tests.    Marland Kitchen

## 2021-07-16 NOTE — Anesthesia Procedure Notes (Signed)
Procedure Name: MAC Date/Time: 07/16/2021 7:43 AM  Performed by: West Pugh, CRNAPre-anesthesia Checklist: Patient identified, Emergency Drugs available, Suction available, Patient being monitored and Timeout performed Patient Re-evaluated:Patient Re-evaluated prior to induction Oxygen Delivery Method: Simple face mask Preoxygenation: Pre-oxygenation with 100% oxygen Placement Confirmation: positive ETCO2 Dental Injury: Teeth and Oropharynx as per pre-operative assessment

## 2021-07-16 NOTE — H&P (Signed)
[facility] History and Physical   Primary Care Physician:  Pcp, No   Reason for Procedure:   Rectal bleeding and FHx CRCA  Plan:    colonoscopy     HPI: Craig Flores is a 56 y.o. male w/ hx rectal bleeding and brother < 77 having colon cancer. On warfarin w/ hx cardiomyopathy and AICD. Warfarin has been held.   Past Medical History:  Diagnosis Date   Acid reflux    Anxiety    CHF (congestive heart failure) (HCC)    Depression    GERD (gastroesophageal reflux disease)    HLD (hyperlipidemia)    Migraines    Myocardial infarction [facility])     Past Surgical History:  Procedure Laterality Date   CORONARY/GRAFT ACUTE MI REVASCULARIZATION N/A 08/28/2019   Procedure: Coronary/Graft Acute MI Revascularization;  Surgeon: [name], MD;  Location: [place] [facility];  Service: Cardiovascular;  Laterality: N/A;   ICD IMPLANT N/A 11/28/2019   Procedure: ICD IMPLANT;  Surgeon: Deboraha Sprang, MD;  Location: [place] [facility];  Service: Cardiovascular;  Laterality: N/A;   LEFT HEART CATH AND CORONARY ANGIOGRAPHY N/A 08/28/2019   Procedure: LEFT HEART CATH AND CORONARY ANGIOGRAPHY;  Surgeon: [name], MD;  Location: [place] [facility];  Service: Cardiovascular;  Laterality: N/A;    Prior to Admission medications   Medication Sig Start Date End Date Taking? Authorizing Provider  acetaminophen (TYLENOL) 500 MG tablet Take 500 mg by mouth every 6 (six) hours as needed for headache.   Yes [provider]  atorvastatin (LIPITOR) 80 MG tablet Take 1 tablet (80 mg total) by mouth daily. 12/24/20  Yes [name], MD  carvedilol (COREG) 12.5 MG tablet Take 1 tablet (12.5 mg total) by mouth 2 (two) times daily with a meal. 06/10/21  Yes [place], Maricela Bo, FNP  dapagliflozin propanediol (FARXIGA) 10 MG TABS tablet TAKE 1 TABLET (10 MG TOTAL) BY MOUTH DAILY BEFORE BREAKFAST. 12/31/20 12/31/21 Yes [name], MD  digoxin (LANOXIN) 0.125 MG tablet Take  1/2 tablet (0.0625 mg total) by mouth daily. 02/18/21  Yes [name], MD  ezetimibe (ZETIA) 10 MG tablet Take 1 tablet (10 mg total) by mouth daily. 12/24/20 09/21/21 Yes [name], MD  fenofibrate (TRICOR) 145 MG tablet Take 1 tablet (145 mg total) by mouth daily. 06/10/21  Yes [name], MD  furosemide (LASIX) 20 MG tablet Take 1 tablet (20 mg total) by mouth every other day. 04/22/21 07/21/21 Yes [name], MD  hydrOXYzine (ATARAX) 10 MG tablet TAKE 1 TABLET (10 MG TOTAL) BY MOUTH 3 (THREE) TIMES DAILY AS NEEDED. 05/06/21 05/06/22 Yes Penn, Lunette Stands, NP  mirtazapine (REMERON) 45 MG tablet TAKE 1 TABLET (45 MG TOTAL) BY MOUTH AT BEDTIME. 05/06/21 05/06/22 Yes Penn, Cicely, NP  Multiple Vitamin (MULTIVITAMIN WITH MINERALS) TABS tablet Take 1 tablet by mouth daily. 05/02/14  Yes Niel Hummer, NP  omega-3 acid ethyl esters (LOVAZA) 1 g capsule TAKE 2 CAPSULES (2 GRAMS TOTAL) BY MOUTH TWO TIMES DAILY. 07/08/21  Yes [name], MD  omeprazole (PRILOSEC) 20 MG capsule Take 20 mg by mouth daily as needed (acid reflux).   Yes [provider]  sacubitril-valsartan (ENTRESTO) 97-103 MG Take 1 tablet by mouth 2 (two) times daily. 12/24/20  Yes [name], MD  sertraline (ZOLOFT) 100 MG tablet TAKE 1.5 TABLETS (150 MG TOTAL) BY MOUTH DAILY. 05/06/21  Yes Penn, Lunette Stands, NP  SILDENAFIL CITRATE PO  Take 1-2 tablets by mouth daily as needed (ED). 30 mg tablet - take 1 - 2 tablets 30 to 45 min before sexual activity as needed 10/12/19  Yes [provider]  spironolactone (ALDACTONE) 25 MG tablet TAKE 1 TABLET (25 MG TOTAL) BY MOUTH DAILY. 12/17/20 12/17/21 Yes [name], MD  warfarin (COUMADIN) 5 MG tablet TAKE 1 AND 1/2 TABLETS TO 2 TABLETS BY MOUTH DAILY AS DIRECTED BY THE COUMADIN CLINIC. Patient taking differently: Take 7.5-10 mg by mouth See admin instructions. 10 mg on Sunday, 7.5 mg all other days 05/27/21 05/27/22 Yes [name], MD  PEG-KCl-NaCl-NaSulf-Na  Asc-C (PLENVU) 140 g SOLR Take 1 kit by mouth as directed. 04/30/21   Zehr, Janett Billow D, PA-C  MITIGARE 0.6 MG CAPS Take 0.6 mg by mouth daily. 12/05/19 04/04/20  [name], MD    Current Facility-Administered Medications  Medication Dose Route Frequency Provider Last Rate Last Admin   0.9 %  sodium chloride infusion   Intravenous Continuous Zehr, Jessica D, PA-C       lactated ringers infusion   Intravenous Continuous Gatha Mayer, MD 10 mL/hr at 07/16/21 0655 1,000 mL at 07/16/21 0655    Allergies as of 04/30/2021   (No Known Allergies)    Family History  Problem Relation Age of Onset   Cancer Mother        peritoneal   Arrhythmia Father    Prostate cancer Father    Colon cancer Brother 44   Cancer Maternal Grandmother        type unknown   Heart attack Maternal Grandfather    Diabetes Paternal Grandmother    Heart attack Paternal Grandfather     Social History   Socioeconomic History   Marital status: Married    Spouse name: Not on file   Number of children: 0   Years of education: Not on file   Highest education level: Not on file  Occupational History   Occupation: Probation officer  Tobacco Use   Smoking status: Every Day    Packs/day: 0.50    Types: Cigarettes   Smokeless tobacco: Never   Tobacco comments:    x15 yrs as of 2021  Vaping Use   Vaping Use: Never used  Substance and Sexual Activity   Alcohol use: Yes    Comment: a couple of drinks on the weekends   Drug use: Never   Sexual activity: Yes    Birth control/protection: None  Other Topics Concern   Not on file  Social History Narrative   2 adopted children   Social Determinants of Health   Financial Resource Strain: Not on file  Food Insecurity: Not on file  Transportation Needs: Not on file  Physical Activity: Not on file  Stress: Not on file  Social Connections: Not on file  Intimate Partner Violence: Not on file    Review of Systems:  All other review of systems negative except as  mentioned in the HPI.  Physical Exam: Vital signs BP 103/65   Pulse 72   Temp 98 F (36.7 C) (Tympanic)   Resp 20   Ht _0  (1.753 m)   Wt 83 kg   SpO2 96%   BMI 27.02 kg/m   General:   Alert,  Well-developed, well-nourished, pleasant and cooperative in NAD Lungs:  Clear throughout to auscultation.   Heart:  Regular rate and rhythm; no murmurs, clicks, rubs,  or gallops. Abdomen:  Soft, nontender and nondistended. Normal bowel sounds.  Neuro/Psych:  Alert and cooperative. Normal mood and affect. A and O x 3   _0  E. Carlean Purl, MD, [facility] [phone] (pager) 07/16/2021 7:33 AM@

## 2021-07-16 NOTE — Transfer of Care (Signed)
Immediate Anesthesia Transfer of Care Note  Patient: Kidus Delman  Procedure(s) Performed: COLONOSCOPY WITH PROPOFOL POLYPECTOMY  Patient Location: PACU and Endoscopy Unit  Anesthesia Type:MAC  Level of Consciousness: awake, alert , oriented and patient cooperative  Airway & Oxygen Therapy: Patient Spontanous Breathing and Patient connected to face mask oxygen  Post-op Assessment: Report given to RN and Post -op Vital signs reviewed and stable  Post vital signs: Reviewed and stable  Last Vitals:  Vitals Value Taken Time  BP    Temp    Pulse 74 07/16/21 0814  Resp 21 07/16/21 0814  SpO2 96 % 07/16/21 0814  Vitals shown include unvalidated device data.  Last Pain:  Vitals:   07/16/21 0641  TempSrc: Tympanic  PainSc: 0-No pain         Complications: No notable events documented.

## 2021-07-17 ENCOUNTER — Encounter (HOSPITAL_COMMUNITY): Payer: Self-pay | Admitting: Internal Medicine

## 2021-07-17 ENCOUNTER — Other Ambulatory Visit: Payer: Self-pay

## 2021-07-18 LAB — SURGICAL PATHOLOGY

## 2021-07-19 ENCOUNTER — Encounter: Payer: Self-pay | Admitting: Internal Medicine

## 2021-07-22 ENCOUNTER — Other Ambulatory Visit: Payer: Self-pay

## 2021-07-25 ENCOUNTER — Telehealth (INDEPENDENT_AMBULATORY_CARE_PROVIDER_SITE_OTHER): Payer: Medicaid Other | Admitting: Psychiatry

## 2021-07-25 ENCOUNTER — Other Ambulatory Visit: Payer: Self-pay

## 2021-07-25 ENCOUNTER — Encounter (HOSPITAL_COMMUNITY): Payer: Self-pay | Admitting: Psychiatry

## 2021-07-25 ENCOUNTER — Ambulatory Visit (INDEPENDENT_AMBULATORY_CARE_PROVIDER_SITE_OTHER): Payer: Medicaid Other

## 2021-07-25 DIAGNOSIS — Z7901 Long term (current) use of anticoagulants: Secondary | ICD-10-CM

## 2021-07-25 DIAGNOSIS — Z5181 Encounter for therapeutic drug level monitoring: Secondary | ICD-10-CM | POA: Diagnosis not present

## 2021-07-25 DIAGNOSIS — F331 Major depressive disorder, recurrent, moderate: Secondary | ICD-10-CM | POA: Diagnosis not present

## 2021-07-25 DIAGNOSIS — F411 Generalized anxiety disorder: Secondary | ICD-10-CM | POA: Diagnosis not present

## 2021-07-25 DIAGNOSIS — I513 Intracardiac thrombosis, not elsewhere classified: Secondary | ICD-10-CM

## 2021-07-25 LAB — POCT INR: INR: 2.1 (ref 2.0–3.0)

## 2021-07-25 MED ORDER — MIRTAZAPINE 45 MG PO TABS
ORAL_TABLET | ORAL | 3 refills | Status: DC
  Filled 2021-07-25: qty 30, 30d supply, fill #0
  Filled 2021-08-18: qty 30, 30d supply, fill #1
  Filled 2021-09-16: qty 30, 30d supply, fill #2
  Filled 2021-10-15: qty 30, 30d supply, fill #3

## 2021-07-25 MED ORDER — SERTRALINE HCL 100 MG PO TABS
ORAL_TABLET | ORAL | 3 refills | Status: DC
  Filled 2021-07-25: qty 45, 30d supply, fill #0
  Filled 2021-08-18: qty 45, 30d supply, fill #1
  Filled 2021-09-16: qty 45, 30d supply, fill #2
  Filled 2021-10-23: qty 45, 30d supply, fill #3

## 2021-07-25 MED ORDER — HYDROXYZINE HCL 10 MG PO TABS
ORAL_TABLET | Freq: Three times a day (TID) | ORAL | 3 refills | Status: DC | PRN
  Filled 2021-07-25: qty 90, 30d supply, fill #0
  Filled 2021-08-25: qty 90, 30d supply, fill #1
  Filled 2021-09-30: qty 90, 30d supply, fill #2

## 2021-07-25 NOTE — Progress Notes (Signed)
BH MD/PA/NP OP Progress Note Virtual Visit via Video Note  I connected with Craig Flores on 07/25/21 at 11:00 AM EDT by a video enabled telemedicine application and verified that I am speaking with the correct person using two identifiers.  Location: Patient: Home Provider: Clinic   I discussed the limitations of evaluation and management by telemedicine and the availability of in person appointments. The patient expressed understanding and agreed to proceed.  I provided 30 minutes of non-face-to-face time during this encounter.       07/25/2021 11:06 AM Craig Flores  MRN:  [id number]  Chief Complaint: "I am doing okay"  HPI:  56 year old male seen today for follow up psychiatric evaluation. He has a psychiatric history of alcohol dependence, substance induced mood disorder, anxiety, depression, and SI. He is currently being managed on Zoloft '150mg'$  daily,  hydroxyzine 10 mg three times daily as needed, and Mirtazapine 45 mg at nightly. He notes that his medications are effective in managing his psychiatric conditions.   Today he was well-groomed, pleasant, cooperative, engaged in conversation.  He informed Probation officer that overall he is doing okay.  He notes his mood is stable and reports that he has minimal anxiety and depression.  Provider conducted a GAD-7 and patient scored a 2.  Provider also conducted PHQ-9 PHQ 2.  He does endorse adequate sleep.   Today he denies SI/HI/VAH or paranoia.    Patient informed Probation officer that he has been actively trying to lose weight to better his cardiac health.  He notes that he has successfully lost 17 pounds.  He reports that due to his cardiac health at times she is tired but notes that he is able to cope with it.  No medication changes made today.  Patient agreeable to continue medications as prescribed.  No other concerns noted at this time.  Visit Diagnosis:    ICD-10-CM   1. Generalized anxiety disorder  F41.1 hydrOXYzine (ATARAX) 10 MG  tablet    mirtazapine (REMERON) 45 MG tablet    sertraline (ZOLOFT) 100 MG tablet    2. Moderate episode of recurrent major depressive disorder (HCC)  F33.1 mirtazapine (REMERON) 45 MG tablet    sertraline (ZOLOFT) 100 MG tablet      Past Psychiatric History: alcohol dependence, substance induced mood disorder, anxiety, depression, and SI.   Past Medical History:  Past Medical History:  Diagnosis Date   Acid reflux    Anxiety    CHF (congestive heart failure) (HCC)    Depression    GERD (gastroesophageal reflux disease)    HLD (hyperlipidemia)    Migraines    Myocardial infarction [facility])     Past Surgical History:  Procedure Laterality Date   COLONOSCOPY WITH PROPOFOL N/A 07/16/2021   Procedure: COLONOSCOPY WITH PROPOFOL;  Surgeon: Gatha Mayer, MD;  Location: [facility];  Service: Gastroenterology;  Laterality: N/A;   CORONARY/GRAFT ACUTE MI REVASCULARIZATION N/A 08/28/2019   Procedure: Coronary/Graft Acute MI Revascularization;  Surgeon: [name], MD;  Location: [place] [facility];  Service: Cardiovascular;  Laterality: N/A;   ICD IMPLANT N/A 11/28/2019   Procedure: ICD IMPLANT;  Surgeon: Deboraha Sprang, MD;  Location: [place] [facility];  Service: Cardiovascular;  Laterality: N/A;   LEFT HEART CATH AND CORONARY ANGIOGRAPHY N/A 08/28/2019   Procedure: LEFT HEART CATH AND CORONARY ANGIOGRAPHY;  Surgeon: [name], MD;  Location: [place] [facility];  Service: Cardiovascular;  Laterality: N/A;   POLYPECTOMY  07/16/2021   Procedure:  POLYPECTOMY;  Surgeon: Gatha Mayer, MD;  Location: [facility];  Service: Gastroenterology;;    Family Psychiatric History: Notes that his mother and aunt had mental health issues. Notes he belives they had anxiety  Family History:  Family History  Problem Relation Age of Onset   Cancer Mother        peritoneal   Arrhythmia Father    Prostate cancer Father    Colon cancer Brother 37   Cancer Maternal Grandmother         type unknown   Heart attack Maternal Grandfather    Diabetes Paternal Grandmother    Heart attack Paternal Grandfather     Social History:  Social History   Socioeconomic History   Marital status: Married    Spouse name: Not on file   Number of children: 0   Years of education: Not on file   Highest education level: Not on file  Occupational History   Occupation: Probation officer  Tobacco Use   Smoking status: Every Day    Packs/day: 0.50    Types: Cigarettes   Smokeless tobacco: Never   Tobacco comments:    x15 yrs as of 2021  Vaping Use   Vaping Use: Never used  Substance and Sexual Activity   Alcohol use: Yes    Comment: a couple of drinks on the weekends   Drug use: Never   Sexual activity: Yes    Birth control/protection: None  Other Topics Concern   Not on file  Social History Narrative   2 adopted children   Social Determinants of Health   Financial Resource Strain: Not on file  Food Insecurity: Not on file  Transportation Needs: Not on file  Physical Activity: Not on file  Stress: Not on file  Social Connections: Not on file    Allergies: No Known Allergies  Metabolic Disorder Labs: Lab Results  Component Value Date   HGBA1C 5.8 (H) 08/28/2019   MPG 119.76 08/28/2019   No results found for: "PROLACTIN" Lab Results  Component Value Date   CHOL 126 02/18/2021   TRIG 99 02/18/2021   HDL 40 (L) 02/18/2021   CHOLHDL 3.2 02/18/2021   VLDL 20 02/18/2021   LDLCALC 66 02/18/2021   LDLCALC 42 01/03/2021   Lab Results  Component Value Date   TSH 3.181 08/28/2019    Therapeutic Level Labs: No results found for: "LITHIUM" No results found for: "VALPROATE" No results found for: "CBMZ"  Current Medications: Current Outpatient Medications  Medication Sig Dispense Refill   acetaminophen (TYLENOL) 500 MG tablet Take 500 mg by mouth every 6 (six) hours as needed for headache.     atorvastatin (LIPITOR) 80 MG tablet Take 1 tablet (80 mg total) by mouth daily.  30 tablet 11   carvedilol (COREG) 12.5 MG tablet Take 1 tablet (12.5 mg total) by mouth 2 (two) times daily with a meal. 60 tablet 6   dapagliflozin propanediol (FARXIGA) 10 MG TABS tablet TAKE 1 TABLET (10 MG TOTAL) BY MOUTH DAILY BEFORE BREAKFAST. 30 tablet 11   digoxin (LANOXIN) 0.125 MG tablet Take 1/2 tablet (0.0625 mg total) by mouth daily. 30 tablet 6   ezetimibe (ZETIA) 10 MG tablet Take 1 tablet (10 mg total) by mouth daily. 90 tablet 3   fenofibrate (TRICOR) 145 MG tablet Take 1 tablet (145 mg total) by mouth daily. 30 tablet 3   furosemide (LASIX) 20 MG tablet Take 1 tablet (20 mg total) by mouth every other day. 45 tablet 3  hydrOXYzine (ATARAX) 10 MG tablet TAKE 1 TABLET (10 MG TOTAL) BY MOUTH 3 (THREE) TIMES DAILY AS NEEDED. 90 tablet 3   mirtazapine (REMERON) 45 MG tablet TAKE 1 TABLET (45 MG TOTAL) BY MOUTH AT BEDTIME. 30 tablet 3   Multiple Vitamin (MULTIVITAMIN WITH MINERALS) TABS tablet Take 1 tablet by mouth daily.     omega-3 acid ethyl esters (LOVAZA) 1 g capsule TAKE 2 CAPSULES (2 GRAMS TOTAL) BY MOUTH TWO TIMES DAILY. 120 capsule 3   omeprazole (PRILOSEC) 20 MG capsule Take 20 mg by mouth daily as needed (acid reflux).     sacubitril-valsartan (ENTRESTO) 97-103 MG Take 1 tablet by mouth 2 (two) times daily. 60 tablet 11   sertraline (ZOLOFT) 100 MG tablet TAKE 1.5 TABLETS (150 MG TOTAL) BY MOUTH DAILY. 45 tablet 3   SILDENAFIL CITRATE PO Take 1-2 tablets by mouth daily as needed (ED). 30 mg tablet - take 1 - 2 tablets 30 to 45 min before sexual activity as needed     spironolactone (ALDACTONE) 25 MG tablet TAKE 1 TABLET (25 MG TOTAL) BY MOUTH DAILY. 30 tablet 11   warfarin (COUMADIN) 5 MG tablet Take 1.5-2 tablets (7.5-10 mg total) by mouth See admin instructions. 10 mg on Sunday, 7.5 mg all other days     No current facility-administered medications for this visit.     Musculoskeletal: Strength & Muscle Tone: within normal limits and  telehealth visits Gait &  Station: normal, telehealth visits Patient leans: N/A  Psychiatric Specialty Exam: Review of Systems  There were no vitals taken for this visit.There is no height or weight on file to calculate BMI.  General Appearance: Well Groomed  Eye Contact:  Good  Speech:  Clear and Coherent and Normal Rate  Volume:  Normal  Mood:  Euthymic  Affect:  Appropriate and Congruent  Thought Process:  Coherent, Goal Directed and Linear  Orientation:  Full (Time, Place, and Person)  Thought Content: WDL and Logical   Suicidal Thoughts:  No  Homicidal Thoughts:  No  Memory:  Immediate;   Good Recent;   Good Remote;   Good  Judgement:  Good  Insight:  Good  Psychomotor Activity:  Normal  Concentration:  Concentration: Good and Attention Span: Good  Recall:  Good  Fund of Knowledge: Good  Language: Good  Akathisia:  No  Handed:  Right  AIMS (if indicated): Not done  Assets:  Communication Skills Desire for Improvement Financial Resources/Insurance Housing Intimacy Social Support  ADL's:  Intact  Cognition: WNL  Sleep:  Good   Screenings: AIMS    Flowsheet Row Admission (Discharged) from 04/26/2014 in [facility]  AIMS Total Score 0      AUDIT    Flowsheet Row Admission (Discharged) from 04/26/2014 in [facility]  Alcohol Use Disorder Identification Test Final Score (AUDIT) 19      GAD-7    Flowsheet Row Video Visit from 07/25/2021 in [facility] Video Visit from 02/15/2021 in [facility] Video Visit from 11/23/2020 in [facility] Video Visit from 08/22/2020 in [facility] Video Visit from 05/23/2020 [facility]  Total GAD-7 Score '2 2 2 3 8      '$ PHQ2-9    Flowsheet Row Video Visit from 07/25/2021 in [facility] Video Visit from 02/15/2021 in  [facility] Video Visit from 11/23/2020  in [facility] Video Visit from 08/22/2020 in [facility] Video Visit from 05/23/2020 in [facility]  PHQ-2 Total Score '1 2 1 '$ 0 2  PHQ-9 Total Score '2 9 3 3 7      '$ Flowsheet Row Video Visit from 07/25/2021 in [facility] Admission (Discharged) from 07/16/2021 in [facility] Video Visit from 05/23/2020 in [facility] No Risk No Risk No Risk        Assessment and Plan: Patient notes that he is doing well on his current medication regimen.  No medication changes made today.  Patient agreeable to continue medications as prescribed.    1. Generalized anxiety disorder  Continue- hydrOXYzine (ATARAX/VISTARIL) 10 MG tablet; TAKE 1 TABLET (10 MG TOTAL) BY MOUTH 3 (THREE) TIMES DAILY AS NEEDED.  Dispense: 90 tablet; Refill: 3 Continue- mirtazapine (REMERON) 45 MG tablet; Take 1 tablet (45 mg total) by mouth at bedtime.  Dispense: 30 tablet; Refill: 3 Continue- sertraline (ZOLOFT) 100 MG tablet; Take 1.5 tablets (150 mg total) by mouth daily.  Dispense: 45 tablet; Refill: 3  2. Moderate episode of recurrent major depressive disorder (HCC)  Continue- mirtazapine (REMERON) 45 MG tablet; Take 1 tablet (45 mg total) by mouth at bedtime.  Dispense: 30 tablet; Refill: 3 Continue- sertraline (ZOLOFT) 100 MG tablet; Take 1.5 tablets (150 mg total) by mouth daily.  Dispense: 45 tablet; Refill: 3  Follow up in 3 months Follow up with therapy  Salley Slaughter, NP 07/25/2021, 11:06 AM

## 2021-07-25 NOTE — Patient Instructions (Signed)
Description   Continue taking Warfarin 1.5 tablets daily except for 2 tablets on Sundays. Recheck INR in 4 weeks. [facility]in Clinic [phone].   BILL [id number]

## 2021-07-29 ENCOUNTER — Other Ambulatory Visit: Payer: Self-pay

## 2021-08-05 ENCOUNTER — Other Ambulatory Visit: Payer: Self-pay

## 2021-08-06 ENCOUNTER — Other Ambulatory Visit: Payer: Self-pay

## 2021-08-06 DIAGNOSIS — I513 Intracardiac thrombosis, not elsewhere classified: Secondary | ICD-10-CM

## 2021-08-06 MED ORDER — WARFARIN SODIUM 5 MG PO TABS
ORAL_TABLET | ORAL | 0 refills | Status: DC
  Filled 2021-08-06: qty 50, 25d supply, fill #0

## 2021-08-12 ENCOUNTER — Other Ambulatory Visit: Payer: Self-pay

## 2021-08-19 ENCOUNTER — Other Ambulatory Visit: Payer: Self-pay

## 2021-08-20 ENCOUNTER — Other Ambulatory Visit: Payer: Self-pay

## 2021-08-22 ENCOUNTER — Ambulatory Visit (INDEPENDENT_AMBULATORY_CARE_PROVIDER_SITE_OTHER): Payer: Medicaid Other

## 2021-08-22 DIAGNOSIS — Z5181 Encounter for therapeutic drug level monitoring: Secondary | ICD-10-CM

## 2021-08-22 DIAGNOSIS — Z7901 Long term (current) use of anticoagulants: Secondary | ICD-10-CM

## 2021-08-22 DIAGNOSIS — I513 Intracardiac thrombosis, not elsewhere classified: Secondary | ICD-10-CM | POA: Diagnosis not present

## 2021-08-22 LAB — POCT INR: INR: 2.9 (ref 2.0–3.0)

## 2021-08-22 NOTE — Patient Instructions (Signed)
Continue taking Warfarin 1.5 tablets daily except for 2 tablets on Sundays. Recheck INR in 6 weeks. Coumadin Clinic [phone].   BILL [id number]

## 2021-08-26 ENCOUNTER — Other Ambulatory Visit: Payer: Self-pay

## 2021-08-29 ENCOUNTER — Other Ambulatory Visit: Payer: Self-pay

## 2021-09-02 ENCOUNTER — Other Ambulatory Visit: Payer: Self-pay

## 2021-09-03 ENCOUNTER — Other Ambulatory Visit: Payer: Self-pay

## 2021-09-03 ENCOUNTER — Other Ambulatory Visit: Payer: Self-pay | Admitting: Cardiology

## 2021-09-03 DIAGNOSIS — I513 Intracardiac thrombosis, not elsewhere classified: Secondary | ICD-10-CM

## 2021-09-04 ENCOUNTER — Other Ambulatory Visit: Payer: Self-pay

## 2021-09-04 MED ORDER — WARFARIN SODIUM 5 MG PO TABS
ORAL_TABLET | ORAL | 0 refills | Status: DC
  Filled 2021-09-04: qty 50, 30d supply, fill #0

## 2021-09-13 ENCOUNTER — Ambulatory Visit (HOSPITAL_COMMUNITY)
Admission: RE | Admit: 2021-09-13 | Discharge: 2021-09-13 | Disposition: A | Discharge status: Home / Self Care | Payer: Medicaid Other | Source: Ambulatory Visit | Attending: Cardiology | Admitting: Cardiology

## 2021-09-13 ENCOUNTER — Encounter (HOSPITAL_COMMUNITY): Payer: Self-pay | Admitting: Cardiology

## 2021-09-13 ENCOUNTER — Other Ambulatory Visit: Payer: Self-pay

## 2021-09-13 VITALS — BP 104/60 | HR 79 | Wt 187.4 lb

## 2021-09-13 DIAGNOSIS — E785 Hyperlipidemia, unspecified: Secondary | ICD-10-CM | POA: Insufficient documentation

## 2021-09-13 DIAGNOSIS — Z95810 Presence of automatic (implantable) cardiac defibrillator: Secondary | ICD-10-CM | POA: Diagnosis not present

## 2021-09-13 DIAGNOSIS — Z8249 Family history of ischemic heart disease and other diseases of the circulatory system: Secondary | ICD-10-CM | POA: Diagnosis not present

## 2021-09-13 DIAGNOSIS — I350 Nonrheumatic aortic (valve) stenosis: Secondary | ICD-10-CM | POA: Insufficient documentation

## 2021-09-13 DIAGNOSIS — I5022 Chronic systolic (congestive) heart failure: Secondary | ICD-10-CM | POA: Insufficient documentation

## 2021-09-13 DIAGNOSIS — F17210 Nicotine dependence, cigarettes, uncomplicated: Secondary | ICD-10-CM | POA: Insufficient documentation

## 2021-09-13 DIAGNOSIS — Z79899 Other long term (current) drug therapy: Secondary | ICD-10-CM | POA: Diagnosis not present

## 2021-09-13 DIAGNOSIS — I2510 Atherosclerotic heart disease of native coronary artery without angina pectoris: Secondary | ICD-10-CM | POA: Insufficient documentation

## 2021-09-13 DIAGNOSIS — I252 Old myocardial infarction: Secondary | ICD-10-CM | POA: Diagnosis not present

## 2021-09-13 DIAGNOSIS — Z7901 Long term (current) use of anticoagulants: Secondary | ICD-10-CM | POA: Diagnosis not present

## 2021-09-13 DIAGNOSIS — I255 Ischemic cardiomyopathy: Secondary | ICD-10-CM | POA: Diagnosis not present

## 2021-09-13 LAB — BASIC METABOLIC PANEL
Anion gap: 10 (ref 5–15)
BUN: 24 mg/dL — ABNORMAL HIGH (ref 6–20)
CO2: 22 mmol/L (ref 22–32)
Calcium: 9.6 mg/dL (ref 8.9–10.3)
Chloride: 107 mmol/L (ref 98–111)
Creatinine, Ser: 1.17 mg/dL (ref 0.61–1.24)
GFR, Estimated: >60 mL/min (ref >60)
Glucose, Bld: 111 mg/dL — ABNORMAL HIGH (ref 70–99)
Potassium: 4.5 mmol/L (ref 3.5–5.1)
Sodium: 139 mmol/L (ref 135–145)

## 2021-09-13 LAB — DIGOXIN LEVEL: Digoxin Level: 0.2 ng/mL — ABNORMAL LOW (ref 0.8–2.0)

## 2021-09-13 MED ORDER — CHANTIX STARTING MONTH PAK 0.5 MG X 11 & 1 MG X 42 PO TBPK
ORAL_TABLET | ORAL | 2 refills | Status: AC
  Filled 2021-09-13: qty 53, 28d supply, fill #0
  Filled 2021-09-15: qty 53, 21d supply, fill #0
  Filled 2021-09-16: qty 53, 17d supply, fill #0

## 2021-09-13 NOTE — Patient Instructions (Signed)
Start Chantix and follow the instructions on the pack  Labs done today, your results will be available in MyChart, we will contact you for abnormal readings.  Your physician has requested that you have an echocardiogram. Echocardiography is a painless test that uses sound waves to create images of your heart. It provides your doctor with information about the size and shape of your heart and how well your heart's chambers and valves are working. This procedure takes approximately one hour. There are no restrictions for this procedure.  Your physician recommends that you schedule a follow-up appointment in: 4 months  If you have any questions or concerns before your next appointment please send [place] a message through [place] or call our office at [phone].    TO LEAVE A MESSAGE FOR THE NURSE SELECT OPTION 2, PLEASE LEAVE A MESSAGE INCLUDING: YOUR NAME DATE OF BIRTH CALL BACK NUMBER REASON FOR CALL**this is important as we prioritize the call backs  YOU WILL RECEIVE A CALL BACK THE SAME DAY AS LONG AS YOU CALL BEFORE 4:00 PM  At the [facility], you and your health needs are our priority. As part of our continuing mission to provide you with exceptional heart care, we have created designated Provider Care Teams. These Care Teams include your primary Cardiologist (physician) and Advanced Practice Providers (APPs- Physician Assistants and Nurse Practitioners) who all work together to provide you with the care you need, when you need it.   You may see any of the following providers on your designated Care Team at your next follow up: Dr Glori Bickers Dr Haynes Kerns, NP Lyda Jester, [place] [facility] [place], [place] Audry Riles, PharmD   Please be sure to bring in all your medications bottles to every appointment.

## 2021-09-15 NOTE — Progress Notes (Signed)
Advanced Heart Failure Clinic Note   PCP: Pcp, [name] Cardiology: Dr. Aundra Dubin  Reason for Visit: Heart Failure    HPI: 56 y.o. male smoker was admitted on 08/28/19 for delayed presentation anterior STEMI. Had had >36 hr of chest pain prior to seeking medical attention. Pain progressed to more pleuritic like CP. Found to have anterior ST elevations w/ precordial Q waves on admit. Hs troponin >27,000. Urgent cardiac cath showed totally occluded mid LAD w/o collaterals and 40% mid RCA stenosis. It was suspected he had completed his infarct and residual pleuritic CP was post-MI pericarditis. No intervention was performed. 2D echo demonstrated moderately reduced LVEF, 45-50%, + apical thrombus and moderate AS. MV normal. RV systolic function mildly reduced.    He was placed on medical management for CAD and systolic HF. He was started on ASA, atorvastatin, Losartan and Coreg. Coumadin started for apical thrombus w/ heparin bridge.   Post cath, there were initial concerns for developing cardiogenic shock but he remained stable and did not require pressor/inotropic support. Co-ox remained stable. Repeat echo showed further reduction of LVEF down to 30-35% but no post MI mechanical complications. RV mildly reduced. He did require IV Lasix for pulmonary edema and volume status and dyspnea improved w/ diuresis. He was continued on GTDMT w/ losartan, spironolactone and Coreg. BP too soft for Entresto. He was treated w/ colchicine for post MI pericarditis w/ improvement in pleuritic chest pain. Given anterior MI w/ EF <35%, he was fitted w/ a LifeVest prior to discharge w/ plans to get repeat echo in 1 month.   He had repeat echo in 9/21, showing EF 25-30%.  He had [place] placed in 11/21.  Echo in 8/22 showed EF 30% with no LV thrombus, normal RV, normal IVC, moderate AS with mean gradient 28 mmHg and AVA 1.08 cm^2. Echo in 12/22 showed EF 30-35%, no LV thrombus, possibly bicuspid aortic valve with  mean gradient 34 mmHg and AVA 1.02 cm^2, normal RV, IVC normal.   We have discussed barostimulation activation therapy, but Medicaid does not have good coverage for this.   He returns for followup of CHF and CAD.  He is back to smoking about 1/2 ppd.  No chest pain.  Stable dyspnea with moderate exertion.  He does walk some on a treadmill for exercise.  No orthopnea/PND.  Coreg recently decreased, not having as many lightheaded spells now.  No falls.  Weight down 5 lbs.    [employer] device interrogation: HeartLogic 0  Labs (9/21): K 4.3, creatinine 0.93 Labs (11/21): LDL 67, HDL 37, K 4.4, creatinine 0.81 Labs (1/22): digoxin 0.5, K 4, creatinine 1.1.  Labs (5/22): digoxin 0.9, K 4.4, creatinine 1.06 Labs (8/22): K 4.3, creatinine 0.91, TGs 343, LDL 65, HDL 31, digoxin 1.0 Labs (12/22): K 4.1, creatinine 1.14, pro-BNP 120 Labs (1/23): hgb 14.7, LDL 66, TGs 99 Labs (5/23): digoxin 0.3, K 4.3, creatinine 1.17  PMH: 1. GERD 2. Depression  3. CAD: Anterior STEMI in 8/21 with totally occluded LAD/no collaterals, 40% mid RCA.  Due to late presentation, no PCI was done.  - Had post-infarct pericarditis.  4. Chronic systolic CHF: Ischemic cardiomyopathy. [place].  - Echo (9/21) with EF 25-30%, normal RV, moderate AS with mean gradient 22 mmHg.  - Echo (8/22): EF 30% with no LV thrombus, normal RV, normal IVC, moderate AS with mean gradient 28 mmHg and AVA 1.08 cm^2.  - Echo (12/22): EF 30-35%, no LV thrombus, possibly bicuspid aortic  valve with mean gradient 34 mmHg and AVA 1.02 cm^2, normal RV, IVC normal.  5. LV thrombus: Apical thrombus noted on echo at time of MI in 8/21. Resolved by 9/21 echo.  6. Aortic stenosis: Moderate on 12/22 echo. .  7. Active smoker.   Current Outpatient Medications  Medication Sig Dispense Refill   acetaminophen (TYLENOL) 500 MG tablet Take 500 mg by mouth every 6 (six) hours as needed for headache.     atorvastatin (LIPITOR) 80 MG tablet  Take 1 tablet (80 mg total) by mouth daily. 30 tablet 11   carvedilol (COREG) 12.5 MG tablet Take 1 tablet (12.5 mg total) by mouth 2 (two) times daily with a meal. 60 tablet 6   dapagliflozin propanediol (FARXIGA) 10 MG TABS tablet TAKE 1 TABLET (10 MG TOTAL) BY MOUTH DAILY BEFORE BREAKFAST. 30 tablet 11   digoxin (LANOXIN) 0.125 MG tablet Take 1/2 tablet (0.0625 mg total) by mouth daily. 30 tablet 6   ezetimibe (ZETIA) 10 MG tablet Take 1 tablet (10 mg total) by mouth daily. 90 tablet 3   fenofibrate (TRICOR) 145 MG tablet Take 1 tablet (145 mg total) by mouth daily. 30 tablet 3   furosemide (LASIX) 20 MG tablet Take 1 tablet (20 mg total) by mouth every other day. 45 tablet 3   hydrOXYzine (ATARAX) 10 MG tablet TAKE 1 TABLET (10 MG TOTAL) BY MOUTH 3 (THREE) TIMES DAILY AS NEEDED. 90 tablet 3   mirtazapine (REMERON) 45 MG tablet TAKE 1 TABLET (45 MG TOTAL) BY MOUTH AT BEDTIME. 30 tablet 3   Multiple Vitamin (MULTIVITAMIN WITH MINERALS) TABS tablet Take 1 tablet by mouth daily.     omega-3 acid ethyl esters (LOVAZA) 1 g capsule TAKE 2 CAPSULES (2 GRAMS TOTAL) BY MOUTH TWO TIMES DAILY. 120 capsule 3   omeprazole (PRILOSEC) 20 MG capsule Take 20 mg by mouth daily as needed (acid reflux).     sacubitril-valsartan (ENTRESTO) 97-103 MG Take 1 tablet by mouth 2 (two) times daily. 60 tablet 11   sertraline (ZOLOFT) 100 MG tablet TAKE 1.5 TABLETS (150 MG TOTAL) BY MOUTH DAILY. 45 tablet 3   SILDENAFIL CITRATE PO Take 1-2 tablets by mouth daily as needed (ED). 30 mg tablet - take 1 - 2 tablets 30 to 45 min before sexual activity as needed     spironolactone (ALDACTONE) 25 MG tablet TAKE 1 TABLET (25 MG TOTAL) BY MOUTH DAILY. 30 tablet 11   Varenicline Tartrate, Starter, (CHANTIX STARTING MONTH PAK) 0.5 MG X 11 & 1 MG X 42 TBPK Take 0.5 mg by mouth daily for 3 days, THEN 0.5 mg 2 (two) times daily for 3 days, THEN 1 mg 2 (two) times daily. 53 each 2   warfarin (COUMADIN) 5 MG tablet Take 1.5 to 2 tablets by  mouth once daily as directed by Anticoagulation clinic 50 tablet 0   No current facility-administered medications for this encounter.    No Known Allergies    Social History   Socioeconomic History   Marital status: Married    Spouse name: Not on file   Number of children: 0   Years of education: Not on file   Highest education level: Not on file  Occupational History   Occupation: Probation officer  Tobacco Use   Smoking status: Every Day    Packs/day: 0.50    Types: Cigarettes   Smokeless tobacco: Never   Tobacco comments:    x15 yrs as of 2021  Vaping Use   Vaping Use:  Never used  Substance and Sexual Activity   Alcohol use: Yes    Comment: a couple of drinks on the weekends   Drug use: Never   Sexual activity: Yes    Birth control/protection: None  Other Topics Concern   Not on file  Social History Narrative   2 adopted children   Social Determinants of Health   Financial Resource Strain: Not on file  Food Insecurity: Not on file  Transportation Needs: Not on file  Physical Activity: Not on file  Stress: Not on file  Social Connections: Not on file  Intimate Partner Violence: Not on file      Family History  Problem Relation Age of Onset   Cancer Mother        peritoneal   Arrhythmia Father    Prostate cancer Father    Colon cancer Brother 35   Cancer Maternal Grandmother        type unknown   Heart attack Maternal Grandfather    Diabetes Paternal Grandmother    Heart attack Paternal Grandfather     Vitals:   09/13/21 1108  BP: 104/60  Pulse: 79  SpO2: 95%  Weight: 85 kg (187 lb 6.4 oz)   Wt Readings from Last 3 Encounters:  09/13/21 85 kg (187 lb 6.4 oz)  07/16/21 83 kg (183 lb)  06/10/21 87.3 kg (192 lb 6.4 oz)    PHYSICAL EXAM: General: NAD Neck: No JVD, no thyromegaly or thyroid nodule.  Lungs: Clear to auscultation bilaterally with normal respiratory effort. CV: Nondisplaced PMI.  Heart regular S1/S2, no S3/S4, 2/6 SEM RUSB with clear S2.   No peripheral edema.  No carotid bruit.  Normal pedal pulses.  Abdomen: Soft, nontender, no hepatosplenomegaly, no distention.  Skin: Intact without lesions or rashes.  Neurologic: Alert and oriented x 3.  Psych: Normal affect. Extremities: No clubbing or cyanosis.  HEENT: Normal.   ASSESSMENT & PLAN: 1. CAD: Admission 8/21 for acute anterior MI, delayed presentation with no ischemic pain, had pleuritic-type chest pain (post-MI pericarditis). LHC with totally occluded mid LAD w/o collaterals.  Suspected completed MI, no intervention. Plan medical management. No ischemic chest pain.  - No ASA given warfarin use.  - Continue atorvastatin 80 mg daily.  2. Chronic systolic CHF: Ischemic Cardiomyopathy.  Reduced EF post anterior MI.  Echo in 9/21 with EF 25-30%, peri-apical akinesis, moderate AS. Cath w/ occluded mLAD but no intervention as he had completed his infarct. He has [place].  Echo in 8/22 with EF 30%.  Echo in 12/22 with EF 30-35%, normal RV, moderate AS. Currently NYHA class II-III, not volume overloaded on exam.   - Continue Entresto 97/103 bid.   - Continue spironolactone 25 mg daily. BMET today.  - Continue Coreg 12.5 mg bid, orthostatic with attempts to increase.   - Continue dapagliflozin 10 mg daily.  - Continue digoxin 0.0625, check level today.   - He is not taking Lasix.   - High carotid bifurcation so cannot get [place], but being evaluation for commercial baroreceptor activation therapy.  Trying to get approval from Medicaid.  3. Post MI Pericarditis: Resolved.  4. LV thrombus: Large LV thrombus noted on echo in 8/21, resolved on 9/21 echo.   - Continue warfarin with EF still low.  5. H/o Tobacco Abuse: He has gone back to smoking. Encouraged cessation.  - Restart Chantix.    6. HLD: Good lipids in 1/23.  7. Hematochezia: Resolved.  Colonoscopy with diverticulosis and hemorrhoids.  8. Aortic stenosis: Bicuspid aortic valve with moderate AS on 12/22 echo.   - Arrange for repeat echo to follow aortic stenosis.   Followup 3 months with APP.   Loralie Champagne, MD  09/15/2021

## 2021-09-16 ENCOUNTER — Other Ambulatory Visit: Payer: Self-pay

## 2021-09-17 ENCOUNTER — Other Ambulatory Visit: Payer: Self-pay

## 2021-09-24 ENCOUNTER — Encounter: Payer: Self-pay | Admitting: Cardiology

## 2021-09-25 ENCOUNTER — Ambulatory Visit (HOSPITAL_COMMUNITY)
Admission: RE | Admit: 2021-09-25 | Discharge: 2021-09-25 | Disposition: A | Discharge status: Home / Self Care | Payer: Medicaid Other | Source: Ambulatory Visit | Attending: Cardiology | Admitting: Cardiology

## 2021-09-25 ENCOUNTER — Encounter (HOSPITAL_COMMUNITY): Payer: Self-pay | Admitting: *Deleted

## 2021-09-25 ENCOUNTER — Telehealth (HOSPITAL_COMMUNITY): Payer: Self-pay | Admitting: *Deleted

## 2021-09-25 ENCOUNTER — Telehealth: Payer: Self-pay

## 2021-09-25 ENCOUNTER — Ambulatory Visit (INDEPENDENT_AMBULATORY_CARE_PROVIDER_SITE_OTHER): Payer: Medicaid Other

## 2021-09-25 ENCOUNTER — Other Ambulatory Visit (HOSPITAL_COMMUNITY): Payer: Self-pay | Admitting: *Deleted

## 2021-09-25 ENCOUNTER — Other Ambulatory Visit: Payer: Self-pay

## 2021-09-25 DIAGNOSIS — Z5181 Encounter for therapeutic drug level monitoring: Secondary | ICD-10-CM

## 2021-09-25 DIAGNOSIS — I5022 Chronic systolic (congestive) heart failure: Secondary | ICD-10-CM

## 2021-09-25 DIAGNOSIS — Z7901 Long term (current) use of anticoagulants: Secondary | ICD-10-CM

## 2021-09-25 DIAGNOSIS — I513 Intracardiac thrombosis, not elsewhere classified: Secondary | ICD-10-CM

## 2021-09-25 DIAGNOSIS — I509 Heart failure, unspecified: Secondary | ICD-10-CM | POA: Diagnosis present

## 2021-09-25 DIAGNOSIS — F17200 Nicotine dependence, unspecified, uncomplicated: Secondary | ICD-10-CM | POA: Insufficient documentation

## 2021-09-25 DIAGNOSIS — Z950 Presence of cardiac pacemaker: Secondary | ICD-10-CM | POA: Diagnosis not present

## 2021-09-25 DIAGNOSIS — I350 Nonrheumatic aortic (valve) stenosis: Secondary | ICD-10-CM | POA: Insufficient documentation

## 2021-09-25 DIAGNOSIS — I2510 Atherosclerotic heart disease of native coronary artery without angina pectoris: Secondary | ICD-10-CM | POA: Diagnosis not present

## 2021-09-25 DIAGNOSIS — I252 Old myocardial infarction: Secondary | ICD-10-CM | POA: Insufficient documentation

## 2021-09-25 DIAGNOSIS — E785 Hyperlipidemia, unspecified: Secondary | ICD-10-CM | POA: Insufficient documentation

## 2021-09-25 DIAGNOSIS — I5021 Acute systolic (congestive) heart failure: Secondary | ICD-10-CM

## 2021-09-25 LAB — PROTIME-INR
INR: 2.6 — ABNORMAL HIGH (ref 0.8–1.2)
Prothrombin Time: 27.6 seconds — ABNORMAL HIGH (ref 11.4–15.2)

## 2021-09-25 LAB — ECHOCARDIOGRAM COMPLETE
AR max vel: 1.06 cm2
AV Area VTI: 1.07 cm2
AV Area mean vel: 1.02 cm2
AV Mean grad: 26.5 mmHg
AV Peak grad: 46 mmHg
Ao pk vel: 3.39 m/s
Area-P 1/2: 3.62 cm2
S' Lateral: 3.4 cm

## 2021-09-25 MED ORDER — ASPIRIN 81 MG PO CHEW
81.0000 mg | CHEWABLE_TABLET | Freq: Every day | ORAL | 3 refills | Status: AC
  Filled 2021-09-25 (×2): qty 90, 90d supply, fill #0

## 2021-09-25 NOTE — Patient Instructions (Signed)
Description   09/25/21:GOAL CHANGED TO 2.5-3.5 START taking Warfarin 1.5 tablets daily except for 2 tablets on Sundays and Wednesdays.  Recheck INR in 1 week.  Coumadin Clinic [phone].   BILL [id number]

## 2021-09-25 NOTE — Telephone Encounter (Signed)
Patient here at [facility] for echo today which revealed thrombus in heart. Per Dr. Aundra Dubin pt needs following:   1. Start baby asa daily  2. Stat INR  3. INR goal 2.5 - 3.5  4. If INR today is < 2.5, start patient on Lovenox - to be managed by Coumadin Clinic  5. Patient needs f/u with Dr. Aundra Dubin in 1 month with echo  Reviewed above with Craig Abu, RN at [facility]. She will f/u on INR results today and call pt via his cell phone with instructions.   Both patient and Ms. Craig Flores verbalized understanding of above.   Clinic appointment and echo will be scheduled. HF Clinic will call patient with same.

## 2021-09-25 NOTE — Telephone Encounter (Signed)
STAT INR resulted - 2.6. Called pt and gave dosing instructions. Refer to anticoagulation encounter.

## 2021-09-25 NOTE — Progress Notes (Signed)
Dr. Aundra Dubin was contacted per lab protocol with Echo results of Left Ventricular Mural Thrombus. Patient was escorted to HF clinic by Dr. Aundra Dubin for further evaluation.  Patient stable and without complaint on transfer of care.    Craig Flores, RDCS

## 2021-09-25 NOTE — Telephone Encounter (Signed)
Received call from Zada Girt, RN stating pt had echo today that showed Left Ventricular Mural Thrombus. Dr Aundra Dubin ordered to change INR goal 2.5-3.5. If INR is NOT >2.5, pt should start Lovenox until INR is >2.5.  Lovenox prescription, if needed should be sent to [facility]. Read back these orders to [facility] to confirm they were correct and will continue to follow until STAT INR has resulted.

## 2021-09-26 ENCOUNTER — Telehealth (HOSPITAL_COMMUNITY): Payer: Self-pay

## 2021-09-26 ENCOUNTER — Other Ambulatory Visit: Payer: Self-pay

## 2021-09-26 DIAGNOSIS — I5022 Chronic systolic (congestive) heart failure: Secondary | ICD-10-CM

## 2021-09-26 NOTE — Telephone Encounter (Addendum)
Pt aware, agreeable, and verbalized understanding  Echo scheduled and ordered   ----- Message from [name], MD sent at 09/26/2021 12:23 AM EDT ----- LV thrombus.  Moderate AS.  Add aspirin 81 mg daily and need to increase INR goal to 2.5-3.5.  Repeat echo in 2 months to make sure thrombus has resolved.

## 2021-10-01 ENCOUNTER — Other Ambulatory Visit: Payer: Self-pay

## 2021-10-03 ENCOUNTER — Ambulatory Visit: Payer: Medicaid Other | Attending: Cardiology

## 2021-10-03 DIAGNOSIS — Z7901 Long term (current) use of anticoagulants: Secondary | ICD-10-CM | POA: Diagnosis not present

## 2021-10-03 DIAGNOSIS — I513 Intracardiac thrombosis, not elsewhere classified: Secondary | ICD-10-CM

## 2021-10-03 DIAGNOSIS — Z5181 Encounter for therapeutic drug level monitoring: Secondary | ICD-10-CM | POA: Diagnosis not present

## 2021-10-03 LAB — POCT INR: INR: 1.9 — AB (ref 2.0–3.0)

## 2021-10-03 NOTE — Patient Instructions (Signed)
TAKE 2 TABLETS TODAY AND TOMORROW ONLY Then continue 1.5 tablets daily except for 2 tablets on Sundays and Wednesdays.  Recheck INR in 2 weeks.  Coumadin Clinic [phone].   BILL [id number]

## 2021-10-04 ENCOUNTER — Other Ambulatory Visit (HOSPITAL_COMMUNITY): Payer: Medicaid Other

## 2021-10-06 ENCOUNTER — Other Ambulatory Visit: Payer: Self-pay | Admitting: Cardiology

## 2021-10-06 ENCOUNTER — Other Ambulatory Visit (HOSPITAL_COMMUNITY): Payer: Self-pay | Admitting: Cardiology

## 2021-10-06 DIAGNOSIS — I513 Intracardiac thrombosis, not elsewhere classified: Secondary | ICD-10-CM

## 2021-10-07 ENCOUNTER — Other Ambulatory Visit: Payer: Self-pay

## 2021-10-07 MED ORDER — WARFARIN SODIUM 5 MG PO TABS
ORAL_TABLET | ORAL | 3 refills | Status: DC
  Filled 2021-10-07: qty 50, 25d supply, fill #0
  Filled 2021-11-08: qty 50, 25d supply, fill #1
  Filled 2021-12-04: qty 50, 25d supply, fill #2
  Filled 2022-01-05: qty 50, 25d supply, fill #3

## 2021-10-07 MED ORDER — FENOFIBRATE 145 MG PO TABS
145.0000 mg | ORAL_TABLET | Freq: Every day | ORAL | 3 refills | Status: DC
  Filled 2021-10-07: qty 90, 90d supply, fill #0
  Filled 2022-01-05: qty 90, 90d supply, fill #1

## 2021-10-07 NOTE — Telephone Encounter (Signed)
Refill request for warfarin:  Last INR was 1.9 on 10/03/21 Next INR due on 10/17/21 LOV was 03/26/21 Einar Crow MD  Refill approved

## 2021-10-10 ENCOUNTER — Other Ambulatory Visit: Payer: Self-pay

## 2021-10-11 ENCOUNTER — Other Ambulatory Visit: Payer: Self-pay

## 2021-10-15 ENCOUNTER — Other Ambulatory Visit: Payer: Self-pay

## 2021-10-17 ENCOUNTER — Ambulatory Visit: Payer: Medicaid Other | Attending: Cardiology

## 2021-10-17 ENCOUNTER — Other Ambulatory Visit: Payer: Self-pay

## 2021-10-17 DIAGNOSIS — I513 Intracardiac thrombosis, not elsewhere classified: Secondary | ICD-10-CM | POA: Insufficient documentation

## 2021-10-17 DIAGNOSIS — Z7901 Long term (current) use of anticoagulants: Secondary | ICD-10-CM | POA: Insufficient documentation

## 2021-10-17 DIAGNOSIS — Z5181 Encounter for therapeutic drug level monitoring: Secondary | ICD-10-CM | POA: Diagnosis not present

## 2021-10-17 LAB — POCT INR: INR: 3.9 — AB (ref 2.0–3.0)

## 2021-10-17 NOTE — Patient Instructions (Signed)
Description   Only take 1 tablet today and then continue 1.5 tablets daily except for 2 tablets on Sundays and Wednesdays.  Recheck INR in 2 weeks.  Coumadin Clinic [phone].   BILL [id number]

## 2021-10-23 ENCOUNTER — Other Ambulatory Visit: Payer: Self-pay

## 2021-10-25 ENCOUNTER — Other Ambulatory Visit: Payer: Self-pay

## 2021-10-28 ENCOUNTER — Encounter (HOSPITAL_COMMUNITY): Payer: Self-pay | Admitting: Psychiatry

## 2021-10-28 ENCOUNTER — Telehealth (INDEPENDENT_AMBULATORY_CARE_PROVIDER_SITE_OTHER): Payer: Medicaid Other | Admitting: Psychiatry

## 2021-10-28 ENCOUNTER — Other Ambulatory Visit: Payer: Self-pay

## 2021-10-28 DIAGNOSIS — F331 Major depressive disorder, recurrent, moderate: Secondary | ICD-10-CM

## 2021-10-28 DIAGNOSIS — F411 Generalized anxiety disorder: Secondary | ICD-10-CM | POA: Diagnosis not present

## 2021-10-28 MED ORDER — MIRTAZAPINE 45 MG PO TABS
45.0000 mg | ORAL_TABLET | Freq: Every day | ORAL | 3 refills | Status: DC
  Filled 2021-10-28: qty 30, fill #0
  Filled 2021-11-21: qty 30, 30d supply, fill #0
  Filled 2021-12-18: qty 30, 30d supply, fill #1
  Filled 2022-01-19 – 2022-01-21 (×2): qty 30, 30d supply, fill #2
  Filled 2022-02-15 – 2022-02-18 (×2): qty 30, 30d supply, fill #3

## 2021-10-28 MED ORDER — HYDROXYZINE HCL 25 MG PO TABS
25.0000 mg | ORAL_TABLET | Freq: Three times a day (TID) | ORAL | 3 refills | Status: DC | PRN
  Filled 2021-10-28: qty 90, 30d supply, fill #0
  Filled 2021-11-27: qty 90, 30d supply, fill #1
  Filled 2022-01-13: qty 90, 30d supply, fill #2
  Filled 2022-03-23: qty 90, 30d supply, fill #3

## 2021-10-28 MED ORDER — SERTRALINE HCL 100 MG PO TABS
150.0000 mg | ORAL_TABLET | Freq: Every day | ORAL | 3 refills | Status: DC
  Filled 2021-10-28: qty 45, fill #0
  Filled 2021-11-21: qty 45, 30d supply, fill #0
  Filled 2021-12-18: qty 45, 30d supply, fill #1
  Filled 2022-01-19 – 2022-01-21 (×2): qty 45, 30d supply, fill #2
  Filled 2022-02-15 – 2022-02-18 (×2): qty 45, 30d supply, fill #3

## 2021-10-28 NOTE — Progress Notes (Signed)
BH MD/PA/NP OP Progress Note Virtual Visit via Video Note  I connected with Craig Flores on 10/28/21 at  2:30 PM EDT by a video enabled telemedicine application and verified that I am speaking with the correct person using two identifiers.  Location: Patient: Home Provider: Clinic   I discussed the limitations of evaluation and management by telemedicine and the availability of in person appointments. The patient expressed understanding and agreed to proceed.  I provided 30 minutes of non-face-to-face time during this encounter.       10/28/2021 3:23 PM Craig Flores  MRN:  [id number]  Chief Complaint: "I have had some heart issues"  HPI:  56 year old male seen today for follow up psychiatric evaluation. He has a psychiatric history of alcohol dependence, substance induced mood disorder, anxiety, depression, and SI. He is currently being managed on Zoloft '150mg'$  daily,  hydroxyzine 10 mg three times daily as needed, and Mirtazapine 45 mg at nightly. He notes that his medications are somewhat effective in managing his psychiatric conditions.   Today he was well-groomed, pleasant, cooperative, engaged in conversation.  He informed Probation officer that he is having some heart issues.  He informed Probation officer that recently he was told that he has a blood clot in his heart.  He notes that he worries about his health, his family, and his finances.  Today provider conducted a GAD-7 and patient scored a 17, at his last visit he scored a 2.  Provider also conducted PHQ-9 and patient scored a 12, at his last visit he scored a 2.  He endorses adequate appetite and sleep.  Today he denies SI/HI/VAH, mania, paranoia.    Today patient agreeable to increasing hydroxyzine 10 mg 3 times daily to 25 mg 3 times daily to help manage anxiety.  He will continue all other medication as prescribed.  No other concerns at this time.    Visit Diagnosis:    ICD-10-CM   1. Generalized anxiety disorder  F41.1 hydrOXYzine  (ATARAX) 25 MG tablet    mirtazapine (REMERON) 45 MG tablet    sertraline (ZOLOFT) 100 MG tablet    2. Moderate episode of recurrent major depressive disorder (HCC)  F33.1 mirtazapine (REMERON) 45 MG tablet    sertraline (ZOLOFT) 100 MG tablet      Past Psychiatric History: alcohol dependence, substance induced mood disorder, anxiety, depression, and SI.   Past Medical History:  Past Medical History:  Diagnosis Date   Acid reflux    Anxiety    CHF (congestive heart failure) (HCC)    Depression    GERD (gastroesophageal reflux disease)    HLD (hyperlipidemia)    Migraines    Myocardial infarction [facility])     Past Surgical History:  Procedure Laterality Date   COLONOSCOPY WITH PROPOFOL N/A 07/16/2021   Procedure: COLONOSCOPY WITH PROPOFOL;  Surgeon: Gatha Mayer, MD;  Location: [facility];  Service: Gastroenterology;  Laterality: N/A;   CORONARY/GRAFT ACUTE MI REVASCULARIZATION N/A 08/28/2019   Procedure: Coronary/Graft Acute MI Revascularization;  Surgeon: [name], MD;  Location: [place] [facility];  Service: Cardiovascular;  Laterality: N/A;   ICD IMPLANT N/A 11/28/2019   Procedure: ICD IMPLANT;  Surgeon: Deboraha Sprang, MD;  Location: [place] [facility];  Service: Cardiovascular;  Laterality: N/A;   LEFT HEART CATH AND CORONARY ANGIOGRAPHY N/A 08/28/2019   Procedure: LEFT HEART CATH AND CORONARY ANGIOGRAPHY;  Surgeon: [name], MD;  Location: [place] [facility];  Service: Cardiovascular;  Laterality: N/A;  POLYPECTOMY  07/16/2021   Procedure: POLYPECTOMY;  Surgeon: Gatha Mayer, MD;  Location: [facility];  Service: Gastroenterology;;    Family Psychiatric History: Notes that his mother and aunt had mental health issues. Notes he belives they had anxiety  Family History:  Family History  Problem Relation Age of Onset   Cancer Mother        peritoneal   Arrhythmia Father    Prostate cancer Father    Colon cancer Brother 18   Cancer Maternal  Grandmother        type unknown   Heart attack Maternal Grandfather    Diabetes Paternal Grandmother    Heart attack Paternal Grandfather     Social History:  Social History   Socioeconomic History   Marital status: Married    Spouse name: Not on file   Number of children: 0   Years of education: Not on file   Highest education level: Not on file  Occupational History   Occupation: Probation officer  Tobacco Use   Smoking status: Every Day    Packs/day: 0.50    Types: Cigarettes   Smokeless tobacco: Never   Tobacco comments:    x15 yrs as of 2021  Vaping Use   Vaping Use: Never used  Substance and Sexual Activity   Alcohol use: Yes    Comment: a couple of drinks on the weekends   Drug use: Never   Sexual activity: Yes    Birth control/protection: None  Other Topics Concern   Not on file  Social History Narrative   2 adopted children   Social Determinants of Health   Financial Resource Strain: Not on file  Food Insecurity: Not on file  Transportation Needs: Not on file  Physical Activity: Not on file  Stress: Not on file  Social Connections: Not on file    Allergies: No Known Allergies  Metabolic Disorder Labs: Lab Results  Component Value Date   HGBA1C 5.8 (H) 08/28/2019   MPG 119.76 08/28/2019   No results found for: "PROLACTIN" Lab Results  Component Value Date   CHOL 126 02/18/2021   TRIG 99 02/18/2021   HDL 40 (L) 02/18/2021   CHOLHDL 3.2 02/18/2021   VLDL 20 02/18/2021   LDLCALC 66 02/18/2021   LDLCALC 42 01/03/2021   Lab Results  Component Value Date   TSH 3.181 08/28/2019    Therapeutic Level Labs: No results found for: "LITHIUM" No results found for: "VALPROATE" No results found for: "CBMZ"  Current Medications: Current Outpatient Medications  Medication Sig Dispense Refill   acetaminophen (TYLENOL) 500 MG tablet Take 500 mg by mouth every 6 (six) hours as needed for headache.     aspirin 81 MG chewable tablet Chew 1 tablet (81 mg total)  by mouth daily. 90 tablet 3   atorvastatin (LIPITOR) 80 MG tablet Take 1 tablet (80 mg total) by mouth daily. 30 tablet 11   carvedilol (COREG) 12.5 MG tablet Take 1 tablet (12.5 mg total) by mouth 2 (two) times daily with a meal. 60 tablet 6   dapagliflozin propanediol (FARXIGA) 10 MG TABS tablet TAKE 1 TABLET (10 MG TOTAL) BY MOUTH DAILY BEFORE BREAKFAST. 30 tablet 11   digoxin (LANOXIN) 0.125 MG tablet Take 1/2 tablet (0.0625 mg total) by mouth daily. 30 tablet 6   ezetimibe (ZETIA) 10 MG tablet Take 1 tablet (10 mg total) by mouth daily. 90 tablet 3   fenofibrate (TRICOR) 145 MG tablet Take 1 tablet (145 mg total) by mouth daily.  90 tablet 3   furosemide (LASIX) 20 MG tablet Take 1 tablet (20 mg total) by mouth every other day. 45 tablet 3   hydrOXYzine (ATARAX) 25 MG tablet Take 1 tablet (25 mg total) by mouth 3 (three) times daily as needed. 90 tablet 3   mirtazapine (REMERON) 45 MG tablet Take 1 tablet (45 mg total) by mouth at bedtime. 30 tablet 3   Multiple Vitamin (MULTIVITAMIN WITH MINERALS) TABS tablet Take 1 tablet by mouth daily.     omega-3 acid ethyl esters (LOVAZA) 1 g capsule TAKE 2 CAPSULES (2 GRAMS TOTAL) BY MOUTH TWO TIMES DAILY. 120 capsule 3   omeprazole (PRILOSEC) 20 MG capsule Take 20 mg by mouth daily as needed (acid reflux).     sacubitril-valsartan (ENTRESTO) 97-103 MG Take 1 tablet by mouth 2 (two) times daily. 60 tablet 11   sertraline (ZOLOFT) 100 MG tablet Take 1.5 tablets (150 mg total) by mouth daily. 45 tablet 3   SILDENAFIL CITRATE PO Take 1-2 tablets by mouth daily as needed (ED). 30 mg tablet - take 1 - 2 tablets 30 to 45 min before sexual activity as needed     spironolactone (ALDACTONE) 25 MG tablet TAKE 1 TABLET (25 MG TOTAL) BY MOUTH DAILY. 30 tablet 11   Varenicline Tartrate, Starter, (CHANTIX STARTING MONTH PAK) 0.5 MG X 11 & 1 MG X 42 TBPK Take 0.5 mg by mouth daily for 3 days, THEN 0.5 mg 2 (two) times daily for 3 days, THEN 1 mg 2 (two) times daily. 53  each 2   warfarin (COUMADIN) 5 MG tablet Take 1.5 to 2 tablets by mouth once daily as directed by Anticoagulation clinic 50 tablet 3   No current facility-administered medications for this visit.     Musculoskeletal: Strength & Muscle Tone: Unable to assess, camera off. Gait & Station:  Unable to assess, camera off. Patient leans: N/A  Psychiatric Specialty Exam: Review of Systems  There were no vitals taken for this visit.There is no height or weight on file to calculate BMI.  General Appearance:  Unable to assess, camera off.  Eye Contact:   Unable to assess, camera off.  Speech:  Clear and Coherent and Normal Rate  Volume:  Normal  Mood:  Anxious and Depressed  Affect:  Appropriate and Congruent  Thought Process:  Coherent, Goal Directed and Linear  Orientation:  Full (Time, Place, and Person)  Thought Content: WDL and Logical   Suicidal Thoughts:  No  Homicidal Thoughts:  No  Memory:  Immediate;   Good Recent;   Good Remote;   Good  Judgement:  Good  Insight:  Good  Psychomotor Activity:   Unable to assess, camera off.  Concentration:  Concentration: Good and Attention Span: Good  Recall:  Good  Fund of Knowledge: Good  Language: Good  Akathisia:   Unable to assess, camera off.  Handed:  Right  AIMS (if indicated): Not done  Assets:  Communication Skills Desire for Improvement Financial Resources/Insurance Housing Intimacy Social Support  ADL's:  Intact  Cognition: WNL  Sleep:  Good   Screenings: AIMS    Flowsheet Row Admission (Discharged) from 04/26/2014 in [facility]  AIMS Total Score 0      AUDIT    Flowsheet Row Admission (Discharged) from 04/26/2014 in [facility]  Alcohol Use Disorder Identification Test Final Score (AUDIT) 19      GAD-7    Flowsheet Row Video Visit from 10/28/2021  in [facility] Video Visit from 07/25/2021 in [facility] Video Visit from 02/15/2021 in [facility] Video Visit from 11/23/2020 in [facility] Video Visit from 08/22/2020 in [facility]  Total GAD-7 Score '17 2 2 2 3      '$ PHQ2-9    Flowsheet Row Video Visit from 10/28/2021 in [facility] Video Visit from 07/25/2021 in [facility] Video Visit from 02/15/2021 in [facility] Video Visit from 11/23/2020 in [facility] Video Visit from 08/22/2020 in [place]  PHQ-2 Total Score '5 1 2 1 '$ 0  PHQ-9 Total Score '12 2 9 3 3      '$ Flowsheet Row Video Visit from 07/25/2021 in [facility] Admission (Discharged) from 07/16/2021 in [place] Video Visit from 05/23/2020 in [place] No Risk No Risk No Risk        Assessment and Plan: Patient endorses symptoms of anxiety and depression due to health/current life stressors.  Today he is agreeable to increasing hydroxyzine 10 mg 3 times daily as needed to 25 mg 3 times daily as needed to help manage anxiety.  He will continue all other medication as prescribed.  1. Generalized anxiety disorder  Increased- hydrOXYzine (ATARAX) 25 MG tablet; Take 1 tablet (25 mg total) by mouth 3 (three) times daily as needed.  Dispense: 90 tablet; Refill: 3 Continue- mirtazapine (REMERON) 45 MG tablet; Take 1 tablet (45 mg total) by mouth at bedtime.  Dispense: 30 tablet; Refill: 3 Continue- sertraline (ZOLOFT) 100 MG tablet; Take 1.5 tablets (150 mg total) by mouth daily.  Dispense: 45 tablet; Refill: 3  2. Moderate episode of recurrent major depressive disorder (HCC)  Continue- mirtazapine (REMERON) 45 MG tablet; Take 1 tablet (45 mg total) by mouth at bedtime.  Dispense: 30  tablet; Refill: 3 Continue- sertraline (ZOLOFT) 100 MG tablet; Take 1.5 tablets (150 mg total) by mouth daily.  Dispense: 45 tablet; Refill: 3   Follow up in 3 months Follow up with therapy  Salley Slaughter, NP 10/28/2021, 3:23 PM

## 2021-10-30 ENCOUNTER — Other Ambulatory Visit: Payer: Self-pay

## 2021-10-31 ENCOUNTER — Ambulatory Visit: Payer: Medicaid Other | Attending: Cardiology

## 2021-10-31 DIAGNOSIS — I513 Intracardiac thrombosis, not elsewhere classified: Secondary | ICD-10-CM

## 2021-10-31 DIAGNOSIS — Z7901 Long term (current) use of anticoagulants: Secondary | ICD-10-CM

## 2021-10-31 DIAGNOSIS — Z5181 Encounter for therapeutic drug level monitoring: Secondary | ICD-10-CM

## 2021-10-31 LAB — POCT INR: INR: 2.8 (ref 2.0–3.0)

## 2021-10-31 NOTE — Patient Instructions (Signed)
continue 1.5 tablets daily except for 2 tablets on Sundays and Wednesdays.  Recheck INR in 6 weeks.  Coumadin Clinic [phone].   [place] [id number]

## 2021-11-01 ENCOUNTER — Other Ambulatory Visit: Payer: Self-pay

## 2021-11-04 ENCOUNTER — Telehealth (HOSPITAL_COMMUNITY): Payer: Self-pay | Admitting: Pharmacy Technician

## 2021-11-04 ENCOUNTER — Other Ambulatory Visit (HOSPITAL_COMMUNITY): Payer: Self-pay

## 2021-11-04 ENCOUNTER — Other Ambulatory Visit: Payer: Self-pay

## 2021-11-04 NOTE — Telephone Encounter (Signed)
Patient Advocate Encounter   Received notification from Medicaid that prior authorization for Craig Flores is required.   PA submitted on [employer] [place] [id number] W Status is pending   Will continue to follow.

## 2021-11-05 ENCOUNTER — Other Ambulatory Visit: Payer: Self-pay

## 2021-11-06 ENCOUNTER — Encounter (HOSPITAL_COMMUNITY): Payer: Self-pay

## 2021-11-06 ENCOUNTER — Other Ambulatory Visit (HOSPITAL_COMMUNITY): Payer: Self-pay

## 2021-11-06 ENCOUNTER — Other Ambulatory Visit: Payer: Self-pay

## 2021-11-06 MED ORDER — SACUBITRIL-VALSARTAN 97-103 MG PO TABS
1.0000 | ORAL_TABLET | Freq: Two times a day (BID) | ORAL | 11 refills | Status: DC
  Filled 2021-11-06: qty 60, 30d supply, fill #0
  Filled 2021-11-27: qty 60, 30d supply, fill #1
  Filled 2021-12-29: qty 60, 30d supply, fill #2
  Filled 2022-01-28: qty 60, 30d supply, fill #3

## 2021-11-06 NOTE — Telephone Encounter (Signed)
Advanced Heart Failure Patient Advocate Encounter  Prior Authorization for Delene Loll has been approved.    PA#  [id number] Effective dates: 11/04/21 through 11/05/22  Patients co-pay is $4  Charlann Boxer, CPhT

## 2021-11-06 NOTE — Telephone Encounter (Signed)
Meds ordered this encounter  Medications   sacubitril-valsartan (ENTRESTO) 97-103 MG    Sig: Take 1 tablet by mouth 2 (two) times daily.    Dispense:  60 tablet    Refill:  11    Please cancel all previous orders for current medication. Change in dosage or pill size.

## 2021-11-08 ENCOUNTER — Other Ambulatory Visit: Payer: Self-pay

## 2021-11-08 ENCOUNTER — Other Ambulatory Visit: Payer: Self-pay | Admitting: Cardiology

## 2021-11-08 MED ORDER — OMEGA-3-ACID ETHYL ESTERS 1 G PO CAPS
2.0000 g | ORAL_CAPSULE | Freq: Two times a day (BID) | ORAL | 3 refills | Status: DC
  Filled 2021-11-08: qty 120, 30d supply, fill #0
  Filled 2021-12-04: qty 120, 30d supply, fill #1
  Filled 2022-01-05: qty 120, 30d supply, fill #2
  Filled 2022-02-03: qty 120, 30d supply, fill #3

## 2021-11-15 ENCOUNTER — Other Ambulatory Visit: Payer: Self-pay

## 2021-11-19 ENCOUNTER — Other Ambulatory Visit (HOSPITAL_COMMUNITY): Payer: Medicaid Other

## 2021-11-20 ENCOUNTER — Telehealth: Payer: Self-pay

## 2021-11-20 ENCOUNTER — Ambulatory Visit (HOSPITAL_BASED_OUTPATIENT_CLINIC_OR_DEPARTMENT_OTHER)
Admission: RE | Admit: 2021-11-20 | Discharge: 2021-11-20 | Disposition: A | Discharge status: Home / Self Care | Payer: Medicaid Other | Source: Ambulatory Visit | Attending: Cardiology | Admitting: Cardiology

## 2021-11-20 ENCOUNTER — Ambulatory Visit (HOSPITAL_COMMUNITY)
Admission: RE | Admit: 2021-11-20 | Discharge: 2021-11-20 | Disposition: A | Discharge status: Home / Self Care | Payer: Medicaid Other | Source: Ambulatory Visit | Attending: Cardiology | Admitting: Cardiology

## 2021-11-20 VITALS — BP 110/80 | HR 88 | Wt 184.6 lb

## 2021-11-20 DIAGNOSIS — I350 Nonrheumatic aortic (valve) stenosis: Secondary | ICD-10-CM | POA: Insufficient documentation

## 2021-11-20 DIAGNOSIS — F411 Generalized anxiety disorder: Secondary | ICD-10-CM | POA: Diagnosis not present

## 2021-11-20 DIAGNOSIS — Z79899 Other long term (current) drug therapy: Secondary | ICD-10-CM | POA: Insufficient documentation

## 2021-11-20 DIAGNOSIS — I2510 Atherosclerotic heart disease of native coronary artery without angina pectoris: Secondary | ICD-10-CM | POA: Insufficient documentation

## 2021-11-20 DIAGNOSIS — Z8249 Family history of ischemic heart disease and other diseases of the circulatory system: Secondary | ICD-10-CM | POA: Diagnosis not present

## 2021-11-20 DIAGNOSIS — I5022 Chronic systolic (congestive) heart failure: Secondary | ICD-10-CM | POA: Insufficient documentation

## 2021-11-20 DIAGNOSIS — I252 Old myocardial infarction: Secondary | ICD-10-CM | POA: Insufficient documentation

## 2021-11-20 DIAGNOSIS — I513 Intracardiac thrombosis, not elsewhere classified: Secondary | ICD-10-CM | POA: Diagnosis not present

## 2021-11-20 DIAGNOSIS — I255 Ischemic cardiomyopathy: Secondary | ICD-10-CM | POA: Diagnosis not present

## 2021-11-20 DIAGNOSIS — Z7901 Long term (current) use of anticoagulants: Secondary | ICD-10-CM | POA: Diagnosis not present

## 2021-11-20 DIAGNOSIS — Q231 Congenital insufficiency of aortic valve: Secondary | ICD-10-CM | POA: Insufficient documentation

## 2021-11-20 DIAGNOSIS — R42 Dizziness and giddiness: Secondary | ICD-10-CM | POA: Insufficient documentation

## 2021-11-20 DIAGNOSIS — I5021 Acute systolic (congestive) heart failure: Secondary | ICD-10-CM

## 2021-11-20 DIAGNOSIS — Z95810 Presence of automatic (implantable) cardiac defibrillator: Secondary | ICD-10-CM | POA: Insufficient documentation

## 2021-11-20 DIAGNOSIS — F17210 Nicotine dependence, cigarettes, uncomplicated: Secondary | ICD-10-CM | POA: Insufficient documentation

## 2021-11-20 DIAGNOSIS — I358 Other nonrheumatic aortic valve disorders: Secondary | ICD-10-CM | POA: Insufficient documentation

## 2021-11-20 DIAGNOSIS — I5031 Acute diastolic (congestive) heart failure: Secondary | ICD-10-CM | POA: Diagnosis present

## 2021-11-20 DIAGNOSIS — E785 Hyperlipidemia, unspecified: Secondary | ICD-10-CM | POA: Diagnosis not present

## 2021-11-20 DIAGNOSIS — Z7982 Long term (current) use of aspirin: Secondary | ICD-10-CM | POA: Insufficient documentation

## 2021-11-20 LAB — ECHOCARDIOGRAM COMPLETE
AR max vel: 0.86 cm2
AV Area VTI: 0.88 cm2
AV Area mean vel: 0.94 cm2
AV Mean grad: 27 mmHg
AV Peak grad: 44.1 mmHg
Ao pk vel: 3.32 m/s
Area-P 1/2: 4.99 cm2
S' Lateral: 3.5 cm

## 2021-11-20 LAB — BRAIN NATRIURETIC PEPTIDE: B Natriuretic Peptide: 203 pg/mL — ABNORMAL HIGH (ref 0.0–100.0)

## 2021-11-20 LAB — BASIC METABOLIC PANEL
Anion gap: 11 (ref 5–15)
BUN: 30 mg/dL — ABNORMAL HIGH (ref 6–20)
CO2: 22 mmol/L (ref 22–32)
Calcium: 9.5 mg/dL (ref 8.9–10.3)
Chloride: 104 mmol/L (ref 98–111)
Creatinine, Ser: 1.38 mg/dL — ABNORMAL HIGH (ref 0.61–1.24)
GFR, Estimated: >60 mL/min (ref >60)
Glucose, Bld: 96 mg/dL (ref 70–99)
Potassium: 4.3 mmol/L (ref 3.5–5.1)
Sodium: 137 mmol/L (ref 135–145)

## 2021-11-20 LAB — DIGOXIN LEVEL: Digoxin Level: 0.4 ng/mL — ABNORMAL LOW (ref 0.8–2.0)

## 2021-11-20 MED ORDER — FUROSEMIDE 20 MG PO TABS: 20.0000 mg | ORAL_TABLET | ORAL | 3 refills | Status: DC | PRN

## 2021-11-20 NOTE — Patient Instructions (Signed)
Change Lasix to as needed for weight gain of 3lb in 24 hours or 5lb in a week  Labs done today, your results will be available in MyChart, we will contact you for abnormal readings.  You have been referred to the structural heart. They will call you to arrange your appointment.  Your physician recommends that you schedule a follow-up appointment in: December as scheduled.  If you have any questions or concerns before your next appointment please send [place] a message through [place] or call our office at [phone].    TO LEAVE A MESSAGE FOR THE NURSE SELECT OPTION 2, PLEASE LEAVE A MESSAGE INCLUDING: YOUR NAME DATE OF BIRTH CALL BACK NUMBER REASON FOR CALL**this is important as we prioritize the call backs  YOU WILL RECEIVE A CALL BACK THE SAME DAY AS LONG AS YOU CALL BEFORE 4:00 PM  At the [facility], you and your health needs are our priority. As part of our continuing mission to provide you with exceptional heart care, we have created designated Provider Care Teams. These Care Teams include your primary Cardiologist (physician) and Advanced Practice Providers (APPs- Physician Assistants and Nurse Practitioners) who all work together to provide you with the care you need, when you need it.   You may see any of the following providers on your designated Care Team at your next follow up: Dr Glori Bickers Dr Loralie Champagne Dr. Roxana Hires, NP Lyda Jester, [place] [facility] [place], [place] Forestine Na, NP Audry Riles, PharmD   Please be sure to bring in all your medications bottles to every appointment.

## 2021-11-20 NOTE — Telephone Encounter (Signed)
Received message below:  Colleran, Emer M, RN  P Cv Div Nl Anticoag Please change his INR goal to 3-3.5 per Dr. Aundra Dubin.   Called pt to schedule appt with anticoagulation clinic to check INR, change INR goal, and adjust Warfarin dose accordingly. No answer, left voicemail to call back.

## 2021-11-20 NOTE — Progress Notes (Signed)
  Echocardiogram 2D Echocardiogram has been performed.  Darlina Sicilian M 11/20/2021, 2:46 PM

## 2021-11-20 NOTE — Progress Notes (Signed)
Advanced Heart Failure Clinic Note   PCP: Pcp, [name] Cardiology: Dr. Aundra Dubin  Reason for Visit: Heart Failure    HPI: 56 y.o. male smoker was admitted on 08/28/19 for delayed presentation anterior STEMI. Had had >36 hr of chest pain prior to seeking medical attention. Pain progressed to more pleuritic like CP. Found to have anterior ST elevations w/ precordial Q waves on admit. Hs troponin >27,000. Urgent cardiac cath showed totally occluded mid LAD w/o collaterals and 40% mid RCA stenosis. It was suspected he had completed his infarct and residual pleuritic CP was post-MI pericarditis. No intervention was performed. 2D echo demonstrated moderately reduced LVEF, 45-50%, + apical thrombus and moderate AS. MV normal. RV systolic function mildly reduced.    He was placed on medical management for CAD and systolic HF. He was started on ASA, atorvastatin, Losartan and Coreg. Coumadin started for apical thrombus w/ heparin bridge.   Post cath, there were initial concerns for developing cardiogenic shock but he remained stable and did not require pressor/inotropic support. Co-ox remained stable. Repeat echo showed further reduction of LVEF down to 30-35% but no post MI mechanical complications. RV mildly reduced. He did require IV Lasix for pulmonary edema and volume status and dyspnea improved w/ diuresis. He was continued on GTDMT w/ losartan, spironolactone and Coreg. BP too soft for Entresto. He was treated w/ colchicine for post MI pericarditis w/ improvement in pleuritic chest pain. Given anterior MI w/ EF <35%, he was fitted w/ a LifeVest prior to discharge w/ plans to get repeat echo in 1 month.   He had repeat echo in 9/21, showing EF 25-30%.  He had [place] placed in 11/21.  Echo in 8/22 showed EF 30% with no LV thrombus, normal RV, normal IVC, moderate AS with mean gradient 28 mmHg and AVA 1.08 cm^2. Echo in 12/22 showed EF 30-35%, no LV thrombus, possibly bicuspid aortic valve with  mean gradient 34 mmHg and AVA 1.02 cm^2, normal RV, IVC normal.   Echo was done in 8/23 showing LV thrombus.  INR goal on warfarin was increased to 2.5-3.5.  Echo was done today and reviewed, EF remains 25-30% with peri-apical akinesis and a chronic-appearing LV thrombus, severe low flow/low gradient aortic stenosis (mean gradient 27 mmHg with AVA 0.88 cm^2), bicuspid aortic valve, normal RV, normal IVC.   We have discussed barostimulation activation therapy, but Medicaid does not have good coverage for this.   He returns for followup of CHF and CAD.  Still smoking about 1/2 ppd.  No dyspnea walking on flat ground though he is short of breath walking up stairs. He is fatigued in general.  Rare atypical chest pain (transient, not exertional).  He has significant generalized anxiety.  No orthopnea/PND.   He has been getting lightheaded if he stands too fast or bends over then stands up. No BRBPR/melena.   Labs (9/21): K 4.3, creatinine 0.93 Labs (11/21): LDL 67, HDL 37, K 4.4, creatinine 0.81 Labs (1/22): digoxin 0.5, K 4, creatinine 1.1.  Labs (5/22): digoxin 0.9, K 4.4, creatinine 1.06 Labs (8/22): K 4.3, creatinine 0.91, TGs 343, LDL 65, HDL 31, digoxin 1.0 Labs (12/22): K 4.1, creatinine 1.14, pro-BNP 120 Labs (1/23): hgb 14.7, LDL 66, TGs 99 Labs (5/23): digoxin 0.3, K 4.3, creatinine 1.17 Labs (8/23): digoxin 0.2, K 4.5, creatinine 1.17  ECG (personally reviewed): NSR, old inferior MI, old anterior MI  PMH: 1. GERD 2. Depression  3. CAD: Anterior STEMI in 8/21 with totally occluded  LAD/no collaterals, 40% mid RCA.  Due to late presentation, no PCI was done.  - Had post-infarct pericarditis.  4. Chronic systolic CHF: Ischemic cardiomyopathy. [place].  - Echo (9/21) with EF 25-30%, normal RV, moderate AS with mean gradient 22 mmHg.  - Echo (8/22): EF 30% with no LV thrombus, normal RV, normal IVC, moderate AS with mean gradient 28 mmHg and AVA 1.08 cm^2.  - Echo (12/22):  EF 30-35%, no LV thrombus, possibly bicuspid aortic valve with mean gradient 34 mmHg and AVA 1.02 cm^2, normal RV, IVC normal.  - Echo (10/23): EF remains 25-30% with peri-apical akinesis and a chronic-appearing LV thrombus, severe low flow/low gradient aortic stenosis (mean gradient 27 mmHg with AVA 0.88 cm^2), bicuspid aortic valve, normal RV, normal IVC. 5. LV thrombus: Apical thrombus noted on echo at time of MI in 8/21. Resolved by 9/21 echo.  6. Aortic stenosis: Bicuspid valve. Moderate on 12/22 echo.  - 10/23 echo showed low flow/low gradient severe AS.   7. Active smoker.   Current Outpatient Medications  Medication Sig Dispense Refill   acetaminophen (TYLENOL) 500 MG tablet Take 500 mg by mouth every 6 (six) hours as needed for headache.     aspirin 81 MG chewable tablet Chew 1 tablet (81 mg total) by mouth daily. 90 tablet 3   atorvastatin (LIPITOR) 80 MG tablet Take 1 tablet (80 mg total) by mouth daily. 30 tablet 11   carvedilol (COREG) 12.5 MG tablet Take 1 tablet (12.5 mg total) by mouth 2 (two) times daily with a meal. 60 tablet 6   dapagliflozin propanediol (FARXIGA) 10 MG TABS tablet TAKE 1 TABLET (10 MG TOTAL) BY MOUTH DAILY BEFORE BREAKFAST. 30 tablet 11   digoxin (LANOXIN) 0.125 MG tablet Take 1/2 tablet (0.0625 mg total) by mouth daily. 30 tablet 6   ezetimibe (ZETIA) 10 MG tablet Take 1 tablet (10 mg total) by mouth daily. 90 tablet 3   fenofibrate (TRICOR) 145 MG tablet Take 1 tablet (145 mg total) by mouth daily. 90 tablet 3   hydrOXYzine (ATARAX) 25 MG tablet Take 1 tablet (25 mg total) by mouth 3 (three) times daily as needed. 90 tablet 3   mirtazapine (REMERON) 45 MG tablet Take 1 tablet (45 mg total) by mouth at bedtime. 30 tablet 3   Multiple Vitamin (MULTIVITAMIN WITH MINERALS) TABS tablet Take 1 tablet by mouth daily.     omega-3 acid ethyl esters (LOVAZA) 1 g capsule TAKE 2 CAPSULES (2 GRAMS TOTAL) BY MOUTH TWO TIMES DAILY. 120 capsule 3   omeprazole (PRILOSEC) 20  MG capsule Take 20 mg by mouth daily as needed (acid reflux).     sacubitril-valsartan (ENTRESTO) 97-103 MG Take 1 tablet by mouth 2 (two) times daily. 60 tablet 11   sertraline (ZOLOFT) 100 MG tablet Take 1.5 tablets (150 mg total) by mouth daily. 45 tablet 3   SILDENAFIL CITRATE PO Take 1-2 tablets by mouth daily as needed (ED). 30 mg tablet - take 1 - 2 tablets 30 to 45 min before sexual activity as needed     spironolactone (ALDACTONE) 25 MG tablet TAKE 1 TABLET (25 MG TOTAL) BY MOUTH DAILY. 30 tablet 11   warfarin (COUMADIN) 5 MG tablet Take 1.5 to 2 tablets by mouth once daily as directed by Anticoagulation clinic 50 tablet 3   furosemide (LASIX) 20 MG tablet Take 1 tablet (20 mg total) by mouth as needed. 45 tablet 3   No current facility-administered medications for this encounter.  No Known Allergies    Social History   Socioeconomic History   Marital status: Married    Spouse name: Not on file   Number of children: 0   Years of education: Not on file   Highest education level: Not on file  Occupational History   Occupation: Probation officer  Tobacco Use   Smoking status: Every Day    Packs/day: 0.50    Types: Cigarettes   Smokeless tobacco: Never   Tobacco comments:    x15 yrs as of 2021  Vaping Use   Vaping Use: Never used  Substance and Sexual Activity   Alcohol use: Yes    Comment: a couple of drinks on the weekends   Drug use: Never   Sexual activity: Yes    Birth control/protection: None  Other Topics Concern   Not on file  Social History Narrative   2 adopted children   Social Determinants of Health   Financial Resource Strain: Not on file  Food Insecurity: Not on file  Transportation Needs: Not on file  Physical Activity: Not on file  Stress: Not on file  Social Connections: Not on file  Intimate Partner Violence: Not on file      Family History  Problem Relation Age of Onset   Cancer Mother        peritoneal   Arrhythmia Father    Prostate cancer  Father    Colon cancer Brother 77   Cancer Maternal Grandmother        type unknown   Heart attack Maternal Grandfather    Diabetes Paternal Grandmother    Heart attack Paternal Grandfather     Vitals:   11/20/21 1448  BP: 110/80  Pulse: 88  SpO2: 97%  Weight: 83.7 kg (184 lb 9.6 oz)   Wt Readings from Last 3 Encounters:  11/20/21 83.7 kg (184 lb 9.6 oz)  09/13/21 85 kg (187 lb 6.4 oz)  07/16/21 83 kg (183 lb)   PHYSICAL EXAM: General: NAD Neck: No JVD, no thyromegaly or thyroid nodule.  Lungs: Clear to auscultation bilaterally with normal respiratory effort. CV: Nondisplaced PMI.  Heart regular S1/S2, no S3/S4, 2/6 SEM RUSB with obscured S2.  No peripheral edema.  No carotid bruit.  Normal pedal pulses.  Abdomen: Soft, nontender, no hepatosplenomegaly, no distention.  Skin: Intact without lesions or rashes.  Neurologic: Alert and oriented x 3.  Psych: Normal affect. Extremities: No clubbing or cyanosis.  HEENT: Normal.   ASSESSMENT & PLAN: 1. CAD: Admission 8/21 for acute anterior MI, delayed presentation with no ischemic pain, had pleuritic-type chest pain (post-MI pericarditis). LHC with totally occluded mid LAD w/o collaterals.  Suspected completed MI, no intervention. No ischemic chest pain.  - He is on ASA 81 daily, continue.  - Continue atorvastatin 80 mg daily. Check lipids today.  2. Chronic systolic CHF: Ischemic Cardiomyopathy.  Reduced EF post anterior MI.  Echo in 9/21 with EF 25-30%, peri-apical akinesis, moderate AS. Cath w/ occluded mLAD but no intervention as he had completed his infarct. He has [place].  Echo in 8/22 with EF 30%.  Echo in 12/22 with EF 30-35%, normal RV, moderate AS. Echo today showed EF remains 25-30% with peri-apical akinesis and a chronic-appearing LV thrombus, severe low flow/low gradient aortic stenosis (mean gradient 27 mmHg with AVA 0.88 cm^2), bicuspid aortic valve, normal RV, normal IVC.  NYHA class II-III symptoms, not  volume overloaded by exam with weight down 3 lbs. Suspect symptoms could be related to  low flow/low gradient severe AS.    - Continue Entresto 97/103 bid.   - Continue spironolactone 25 mg daily. BMET today.  - Continue Coreg 12.5 mg bid, orthostatic with attempts to increase.   - Continue dapagliflozin 10 mg daily.  - Continue digoxin 0.0625, check level today.   - I think he can stop standing Lasix and use prn, this may help with orthostatic symptoms.  - High carotid bifurcation so cannot get Batwire, unable to get commercial baroreceptor activation therapy as Medicaid will not cover.  3. Post MI Pericarditis: Resolved.  4. LV thrombus: Large LV thrombus noted on echo in 8/21, resolved on 9/21 echo.  Thrombus had recurred on 8/23 echo and warfarin was increased to INR goal 2.5-3.5 and ASA 81 was added.  On today's echo, there is still an LV thrombus though it is smaller than in 8/23.  It is not mobile.  - Continue warfarin, increase INR goal to 3-3.5 and continue ASA 81 daily.  5. H/o Tobacco Abuse: He has gone back to smoking. Encouraged cessation.  - He should restart Chantix.    6. HLD: Check lipids today.  7. Aortic stenosis: Bicuspid aortic valve with moderate AS on 12/22 echo. Today's echo shows severe low flow/low gradient aortic stenosis with mean gradient 27 mmHg and AVA 0.88 cm^2.  This may be contributing to his symptoms given lack of volume overload on exam.  - I am going to refer to structural heart clinic for TAVR evaluation.  The LV thrombus will present a problem for this.  As above, I am increasing his INR goal to 3-3.5.  He would be a difficult candidate for SAVR given LV dysfunction.  If/when TAVR is planned, will need LHC/RHC prior, will have to bridge off warfarin on Lovenox.   Followup 2 months with APP.   Loralie Champagne, MD  11/20/2021

## 2021-11-21 NOTE — Telephone Encounter (Signed)
Called pt and advised since goal is changing need an appt & may need dose adjustment for new goal 3.0-3.5; made appt.

## 2021-11-22 ENCOUNTER — Other Ambulatory Visit: Payer: Self-pay

## 2021-11-25 ENCOUNTER — Encounter (HOSPITAL_COMMUNITY): Payer: Self-pay

## 2021-11-26 ENCOUNTER — Ambulatory Visit: Payer: Medicaid Other | Attending: Internal Medicine

## 2021-11-26 ENCOUNTER — Ambulatory Visit: Payer: Medicaid Other | Attending: Cardiology | Admitting: *Deleted

## 2021-11-26 DIAGNOSIS — I255 Ischemic cardiomyopathy: Secondary | ICD-10-CM | POA: Diagnosis not present

## 2021-11-26 DIAGNOSIS — Z7901 Long term (current) use of anticoagulants: Secondary | ICD-10-CM | POA: Diagnosis not present

## 2021-11-26 DIAGNOSIS — I513 Intracardiac thrombosis, not elsewhere classified: Secondary | ICD-10-CM | POA: Insufficient documentation

## 2021-11-26 DIAGNOSIS — Z5181 Encounter for therapeutic drug level monitoring: Secondary | ICD-10-CM | POA: Insufficient documentation

## 2021-11-26 DIAGNOSIS — Z95810 Presence of automatic (implantable) cardiac defibrillator: Secondary | ICD-10-CM | POA: Insufficient documentation

## 2021-11-26 LAB — CUP PACEART REMOTE DEVICE CHECK
Battery Remaining Longevity: 162 mo
Battery Remaining Percentage: 100 %
Brady Statistic RV Percent Paced: 0 %
Date Time Interrogation Session: 20231031040400
HighPow Impedance: 79 Ohm
Implantable Lead Connection Status: 753985
Implantable Lead Implant Date: 20211101
Implantable Lead Location: 753860
Implantable Lead Model: 138
Implantable Lead Serial Number: 303500
Implantable Pulse Generator Implant Date: 20211101
Lead Channel Impedance Value: 469 Ohm
Lead Channel Setting Pacing Amplitude: 3.5 V
Lead Channel Setting Pacing Pulse Width: 0.4 ms
Lead Channel Setting Sensing Sensitivity: 0.5 mV
Pulse Gen Serial Number: 209902

## 2021-11-26 LAB — POCT INR: INR: 3 (ref 2.0–3.0)

## 2021-11-26 NOTE — Patient Instructions (Signed)
Description   GOAL CHANGE 3.0-3.5; Start taking warfarin 1.5 tablets daily except for 2 tablets on Sundays, Wednesdays and Fridays. Recheck INR in 2 weeks.  Coumadin Clinic [phone].   BILL [id number]

## 2021-11-28 ENCOUNTER — Other Ambulatory Visit: Payer: Self-pay

## 2021-12-02 ENCOUNTER — Ambulatory Visit: Payer: Medicaid Other | Attending: Cardiovascular Disease | Admitting: Cardiovascular Disease

## 2021-12-02 ENCOUNTER — Telehealth: Payer: Self-pay

## 2021-12-02 ENCOUNTER — Encounter: Payer: Self-pay | Admitting: Cardiovascular Disease

## 2021-12-02 VITALS — BP 110/78 | HR 81 | Ht 69.0 in | Wt 184.0 lb

## 2021-12-02 DIAGNOSIS — Z01812 Encounter for preprocedural laboratory examination: Secondary | ICD-10-CM | POA: Insufficient documentation

## 2021-12-02 DIAGNOSIS — I350 Nonrheumatic aortic (valve) stenosis: Secondary | ICD-10-CM | POA: Diagnosis not present

## 2021-12-02 NOTE — Telephone Encounter (Signed)
Pt returned call. Schedule pt for 12/05/21 at 10:30am with anticoagulation clinic for Lovenox bridging instructions.

## 2021-12-02 NOTE — H&P (View-Only) (Signed)
[facility]ural Heart Clinic Consult Note  Chief Complaint  Patient presents with   New Patient (Initial Visit)    Aortic stenosis   History of Present Illness: 55 yo male with history of GERD, anxiety, depression, hyperlipidemia, CAD, chronic systolic CHF, ischemic cardiomyopathy, LV thrombus, tobacco abuse and severe aortic stenosis. He is here today as a new consult, referred by Dr. Aundra Dubin, for further discussion regarding his aortic stenosis and possible aortic valve replacement. He had an anterior STEMI in August 2021 and his LaD was totally included in the mid segment. This was not opened based on his late presentation. His CAD has been managed medically since then. His LVEF has been below 35% following his MI. An ICD is in place. He is followed in our [facility] by Dr. Aundra Dubin. He has been followed for bicuspid aortic valve stenosis. Most recent echo 11/20/21 with LVEF=25-30% with akinesis of the anteroapical walls. Chronic LV thrombus. Severe low flow, low gradient aortic stenosis with bicuspid aortic valve with mean gradient 27 mmHg, AVA 0.86 cm2, DI 0.18, SVI 33.   He tells me today that he has been having dizziness for the past year that has gotten progressively worse over the past three months. No chest pain. He has mild dyspnea with exertion. No LE edema. He lives in [place]. 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 No active dental issues.   Primary Care Physician: None Primary Cardiologist: Aundra Dubin Referring Cardiologist: Aundra Dubin  Past Medical History:  Diagnosis Date   Acid reflux    Anxiety    Aortic stenosis with bicuspid valve    CHF (congestive heart failure) (HCC)    Depression    GERD (gastroesophageal reflux disease)    HLD (hyperlipidemia)    Ischemic cardiomyopathy    Migraines    Myocardial infarction [facility])     Past Surgical History:  Procedure Laterality Date   COLONOSCOPY WITH PROPOFOL N/A 07/16/2021   Procedure:  COLONOSCOPY WITH PROPOFOL;  Surgeon: Gatha Mayer, MD;  Location: [facility];  Service: Gastroenterology;  Laterality: N/A;   CORONARY/GRAFT ACUTE MI REVASCULARIZATION N/A 08/28/2019   Procedure: Coronary/Graft Acute MI Revascularization;  Surgeon: [name], MD;  Location: [facility];  Service: Cardiovascular;  Laterality: N/A;   ICD IMPLANT N/A 11/28/2019   Procedure: ICD IMPLANT;  Surgeon: Deboraha Sprang, MD;  Location: [facility];  Service: Cardiovascular;  Laterality: N/A;   LEFT HEART CATH AND CORONARY ANGIOGRAPHY N/A 08/28/2019   Procedure: LEFT HEART CATH AND CORONARY ANGIOGRAPHY;  Surgeon: [name], MD;  Location: [facility];  Service: Cardiovascular;  Laterality: N/A;   POLYPECTOMY  07/16/2021   Procedure: POLYPECTOMY;  Surgeon: Gatha Mayer, MD;  Location: [facility];  Service: Gastroenterology;;   WISDOM TOOTH EXTRACTION      Current Outpatient Medications  Medication Sig Dispense Refill   acetaminophen (TYLENOL) 500 MG tablet Take 500 mg by mouth every 6 (six) hours as needed for headache.     aspirin 81 MG chewable tablet Chew 1 tablet (81 mg total) by mouth daily. 90 tablet 3   atorvastatin (LIPITOR) 80 MG tablet Take 1 tablet (80 mg total) by mouth daily. 30 tablet 11   carvedilol (COREG) 12.5 MG tablet Take 1 tablet (12.5 mg total) by mouth 2 (two) times daily with a meal. 60 tablet 6   dapagliflozin propanediol (FARXIGA) 10 MG TABS tablet TAKE 1 TABLET (10 MG TOTAL) BY MOUTH DAILY  BEFORE BREAKFAST. 30 tablet 11   digoxin (LANOXIN) 0.125 MG tablet Take 1/2 tablet (0.0625 mg total) by mouth daily. 30 tablet 6   ezetimibe (ZETIA) 10 MG tablet Take 1 tablet (10 mg total) by mouth daily. 90 tablet 3   fenofibrate (TRICOR) 145 MG tablet Take 1 tablet (145 mg total) by mouth daily. 90 tablet 3   furosemide (LASIX) 20 MG tablet Take 1 tablet (20 mg total) by mouth as needed. 45 tablet 3   hydrOXYzine (ATARAX) 25 MG tablet Take 1  tablet (25 mg total) by mouth 3 (three) times daily as needed. 90 tablet 3   mirtazapine (REMERON) 45 MG tablet Take 1 tablet (45 mg total) by mouth at bedtime. 30 tablet 3   Multiple Vitamin (MULTIVITAMIN WITH MINERALS) TABS tablet Take 1 tablet by mouth daily.     omega-3 acid ethyl esters (LOVAZA) 1 g capsule TAKE 2 CAPSULES (2 GRAMS TOTAL) BY MOUTH TWO TIMES DAILY. 120 capsule 3   omeprazole (PRILOSEC) 20 MG capsule Take 20 mg by mouth daily as needed (acid reflux).     sacubitril-valsartan (ENTRESTO) 97-103 MG Take 1 tablet by mouth 2 (two) times daily. 60 tablet 11   sertraline (ZOLOFT) 100 MG tablet Take 1.5 tablets (150 mg total) by mouth daily. 45 tablet 3   SILDENAFIL CITRATE PO Take 1-2 tablets by mouth daily as needed (ED). 30 mg tablet - take 1 - 2 tablets 30 to 45 min before sexual activity as needed     spironolactone (ALDACTONE) 25 MG tablet TAKE 1 TABLET (25 MG TOTAL) BY MOUTH DAILY. 30 tablet 11   warfarin (COUMADIN) 5 MG tablet Take 1.5 to 2 tablets by mouth once daily as directed by Anticoagulation clinic 50 tablet 3   No current facility-administered medications for this visit.    No Known Allergies  Social History   Socioeconomic History   Marital status: Married    Spouse name: Not on file   Number of children: 0   Years of education: Not on file   Highest education level: Not on file  Occupational History   Occupation: Probation officer   Occupation: Attorney/Writer  Tobacco Use   Smoking status: Every Day    Packs/day: 0.50    Types: Cigarettes   Smokeless tobacco: Never   Tobacco comments:    x15 yrs as of 2021  Vaping Use   Vaping Use: Never used  Substance and Sexual Activity   Alcohol use: Yes    Comment: a couple of drinks on the weekends   Drug use: Never   Sexual activity: Yes    Birth control/protection: None  Other Topics Concern   Not on file  Social History Narrative   2 adopted children   Social Determinants of Health   Financial Resource  Strain: Not on file  Food Insecurity: Not on file  Transportation Needs: Not on file  Physical Activity: Not on file  Stress: Not on file  Social Connections: Not on file  Intimate Partner Violence: Not on file    Family History  Problem Relation Age of Onset   Cancer Mother        peritoneal   Arrhythmia Father    Prostate cancer Father    Colon cancer Brother 47   Cancer Maternal Grandmother        type unknown   Heart attack Maternal Grandfather    Diabetes Paternal Grandmother    Heart attack Paternal Grandfather     Review of Systems:  As stated in the HPI and otherwise negative.   BP 110/78   Pulse 81   Ht '5\' 9"'$  (1.753 m)   Wt 184 lb (83.5 kg)   SpO2 96%   BMI 27.17 kg/m   Physical Examination: General: Well developed, well nourished, NAD  HEENT: OP clear, mucus membranes moist  SKIN: warm, dry. No rashes. Neuro: No focal deficits  Musculoskeletal: Muscle strength 5/5 all ext  Psychiatric: Mood and affect normal  Neck: No JVD, no carotid bruits, no thyromegaly, no lymphadenopathy.  Lungs:Clear bilaterally, no wheezes, rhonci, crackles Cardiovascular: Regular rate and rhythm. Soft systolic murmur.   Abdomen:Soft. Bowel sounds present. Non-tender.  Extremities:  No lower extremity edema. Pulses are 2 + in the bilateral DP/PT.  EKG:  EKG is not ordered today. The ekg ordered today demonstrates   Echo 11/20/21:  1. Left ventricular ejection fraction, by estimation, is 25 to 30%. The  left ventricle has severely decreased function. The left ventricle  demonstrates regional wall motion abnormalities with mid to apical  inferoseptal and anteroseptal akinesis,  akinesis of the apical anterior/inferior/lateral walls and the true apex.  There was chronic-appearing LV thrombus at the apex. There is mild left  ventricular hypertrophy. Left ventricular diastolic parameters are  consistent with Grade I diastolic  dysfunction (impaired relaxation).   2. Right  ventricular systolic function is normal. The right ventricular  size is normal.   3. The mitral valve is normal in structure. Trivial mitral valve  regurgitation. No evidence of mitral stenosis.   4. The aortic valve is bicuspid. There is moderate calcification of the  aortic valve. Aortic valve regurgitation is not visualized. Severe low  flow low gradient aortic valve stenosis. Aortic valve area, by VTI  measures 0.88 cm. Aortic valve mean  gradient measures 27.0 mmHg.   5. Aortic dilatation noted. There is mild dilatation of the ascending  aorta, measuring 37 mm.   6. The inferior vena cava is normal in size with greater than 50%  respiratory variability, suggesting right atrial pressure of 3 mmHg.   FINDINGS   Left Ventricle: Left ventricular ejection fraction, by estimation, is 25  to 30%. The left ventricle has severely decreased function. The left  ventricle demonstrates regional wall motion abnormalities. The left  ventricular internal cavity size was normal   in size. There is mild left ventricular hypertrophy. Left ventricular  diastolic parameters are consistent with Grade I diastolic dysfunction  (impaired relaxation).   Right Ventricle: The right ventricular size is normal. No increase in  right ventricular wall thickness. Right ventricular systolic function is  normal.   Left Atrium: Left atrial size was normal in size.   Right Atrium: Right atrial size was normal in size.   Pericardium: There is no evidence of pericardial effusion.   Mitral Valve: The mitral valve is normal in structure. Trivial mitral  valve regurgitation. No evidence of mitral valve stenosis.   Tricuspid Valve: The tricuspid valve is normal in structure. Tricuspid  valve regurgitation is not demonstrated.   Aortic Valve: The aortic valve is bicuspid. There is moderate  calcification of the aortic valve. Aortic valve regurgitation is not  visualized. Severe aortic stenosis is present. Aortic  valve mean gradient  measures 27.0 mmHg. Aortic valve peak gradient  measures 44.1 mmHg. Aortic valve area, by VTI measures 0.88 cm.   Pulmonic Valve: The pulmonic valve was normal in structure. Pulmonic valve  regurgitation is not visualized.   Aorta: Aortic dilatation noted. There is mild  dilatation of the ascending  aorta, measuring 37 mm.   Venous: The inferior vena cava is normal in size with greater than 50%  respiratory variability, suggesting right atrial pressure of 3 mmHg.   IAS/Shunts: No atrial level shunt detected by color flow Doppler.   Additional Comments: A device lead is visualized in the right ventricle.     LEFT VENTRICLE  PLAX 2D  LVIDd:         5.30 cm   Diastology  LVIDs:         3.50 cm   LV e' medial:    6.60 cm/s  LV PW:         0.90 cm   LV E/e' medial:  9.0  LV IVS:        0.90 cm   LV e' lateral:   5.18 cm/s  LVOT diam:     2.50 cm   LV E/e' lateral: 11.5  LV SV:         67  LV SV Index:   33  LVOT Area:     4.91 cm                             3D Volume EF:                           3D EF:        37 %                           LV EDV:       263 ml                           LV ESV:       165 ml                           LV SV:        98 ml   RIGHT VENTRICLE  RV S prime:     37.90 cm/s  TAPSE (M-mode): 1.6 cm   LEFT ATRIUM             Index  LA Vol (A2C):   48.7 ml 24.23 ml/m  LA Vol (A4C):   59.8 ml 29.76 ml/m  LA Biplane Vol: 59.8 ml 29.76 ml/m   AORTIC VALVE  AV Area (Vmax):    0.86 cm  AV Area (Vmean):   0.94 cm  AV Area (VTI):     0.88 cm  AV Vmax:           332.00 cm/s  AV Vmean:          237.400 cm/s  AV VTI:            0.755 m  AV Peak Grad:      44.1 mmHg  AV Mean Grad:      27.0 mmHg  LVOT Vmax:         58.20 cm/s  LVOT Vmean:        45.700 cm/s  LVOT VTI:          0.136 m  LVOT/AV VTI ratio: 0.18    AORTA  Ao Asc diam: 3.70 cm   MITRAL VALVE  MV Area (PHT): 4.99 cm    SHUNTS  MV Decel Time:  152 msec     Systemic VTI:  0.14 m  MV E velocity: 59.60 cm/s  Systemic Diam: 2.50 cm  MV A velocity: 84.20 cm/s  MV E/A ratio:  0.71   Recent Labs: 01/08/2021: NT-Pro BNP 120 03/26/2021: Hemoglobin 14.7; Platelets 246 11/20/2021: B Natriuretic Peptide 203.0; BUN 30; Creatinine, Ser 1.38; Potassium 4.3; Sodium 137    Wt Readings from Last 3 Encounters:  12/02/21 184 lb (83.5 kg)  11/20/21 184 lb 9.6 oz (83.7 kg)  09/13/21 187 lb 6.4 oz (85 kg)    Assessment and Plan:   1. Severe Aortic Valve Stenosis: He has stage D2 severe low flow/low gradient aortic valve stenosis. NYHA class 2-3 . I have personally reviewed the echo images. The aortic valve is thickened, calcified with limited leaflet mobility. I think he would benefit from AVR. He may end up being a better candidate for surgical AVR than TAVR given the LV thrombus and risk for an embolic event with LV wire used during TAVR. He is young with a bicuspid valve. The biggest risk factor for surgery is his LV dysfunction. If we elect for surgical AVR, can consider a mechanical AVR given his young age and need for chronic coumadin therapy for the LV thrombus. I think he may be a poor candidate for TAVR given the the LV thrombus.    I have reviewed the natural history of aortic stenosis with the patient and their family members  who are present today. We have discussed the limitations of medical therapy and the poor prognosis associated with symptomatic aortic stenosis. We have reviewed potential treatment options, including palliative medical therapy, conventional surgical aortic valve replacement, and transcatheter aortic valve replacement. We discussed treatment options in the context of the patient's specific comorbid medical conditions.   He would like to proceed with planning for TAVR. I will arrange a right and left heart catheterization at [facility] 12/12/21 at 9am. Risks and benefits of the cath procedure and the valve procedure are reviewed with the  patient. After the cath, he will have a cardiac CT, CTA of the chest/abdomen and pelvis and will then be referred to see one of the CT surgeons on our TAVR team.   BMET and CBC today He will need a Lovenox bridge while holding his coumadin for cardiac cath.     Labs/ tests ordered today include:   Orders Placed This Encounter  Procedures   Basic metabolic panel   CBC   Disposition:   F/U as above.   Signed, Lauree Chandler, MD, [facility] 12/02/2021 2:46 PM    [address], [place], [place]  [address] Phone: [phone]; Fax: [phone]

## 2021-12-02 NOTE — Patient Instructions (Addendum)
Medication Instructions:  No changes *If you need a refill on your cardiac medications before your next appointment, please call your pharmacy*   Lab Work: Today: bmet, cbc   Testing/Procedures: Your physician has requested that you have a cardiac catheterization. Cardiac catheterization is used to diagnose and/or treat various heart conditions. Doctors may recommend this procedure for a number of different reasons. The most common reason is to evaluate chest pain. Chest pain can be a symptom of coronary artery disease (CAD), and cardiac catheterization can show whether plaque is narrowing or blocking your heart's arteries. This procedure is also used to evaluate the valves, as well as measure the blood flow and oxygen levels in different parts of your heart. For further information please visit [email or web address]. Please follow instruction sheet, as given.   Follow-Up: Per Structural Heart Team         Cardiac/Peripheral Catheterization   You are scheduled for a Cardiac Catheterization on Thursday, November 16 with Dr. Lauree Chandler.  1. Please arrive at the Main Entrance A at [facility]: [address], [place] [address] on November 16 at 7:00 AM (This time is two hours before your procedure to ensure your preparation). Free valet parking service is available. You will check in at ADMITTING. The support person will be asked to wait in the waiting room.  It is OK to have someone drop you off and come back when you are ready to be discharged.        Special note: Every effort is made to have your procedure done on time. Please understand that emergencies sometimes delay scheduled procedures.   . 2. Diet: Do not eat solid foods after midnight.  You may have clear liquids until 5 AM the day of the procedure.  3. Labs: You will need to have blood drawn today.   4. Medication instructions in preparation for your procedure:   Contrast Allergy: No  Come to  the [facility] as scheduled.  Will plan to hold warfarin with Lovenox Bridge  On the morning of your procedure, take Aspirin 81 mg and any morning medicines NOT listed above.  You may use sips of water.  5. Plan to go home the same day, you will only stay overnight if medically necessary. 6. You MUST have a responsible adult to drive you home. 7. An adult MUST be with you the first 24 hours after you arrive home. 8. Bring a current list of your medications, and the last time and date medication taken. 9. Bring ID and current insurance cards. 10.Please wear clothes that are easy to get on and off and wear slip-on shoes.  Thank you for allowing [place] to care for you!   -- [place] [facility] [place]

## 2021-12-02 NOTE — Telephone Encounter (Signed)
Received message from [place], RN stating pt is scheduled for cath 11/16 and on warfarin, needing lovenox bridge. Called pt to schedule anticoagulation clinic appt for this Thursday, 11/9. No answer, left voicemail to call back as soon as possible.

## 2021-12-02 NOTE — Progress Notes (Signed)
[facility] Clinic Consult Note  Chief Complaint  Patient presents with   New Patient (Initial Visit)    Aortic stenosis   History of Present Illness: 56 yo male with history of GERD, anxiety, depression, hyperlipidemia, CAD, chronic systolic CHF, ischemic cardiomyopathy, LV thrombus, tobacco abuse and severe aortic stenosis. He is here today as a new consult, referred by Dr. Aundra Dubin, for further discussion regarding his aortic stenosis and possible aortic valve replacement. He had an anterior STEMI in August 2021 and his LaD was totally included in the mid segment. This was not opened based on his late presentation. His CAD has been managed medically since then. His LVEF has been below 35% following his MI. An ICD is in place. He is followed in our [facility] by Dr. Aundra Dubin. He has been followed for bicuspid aortic valve stenosis. Most recent echo 11/20/21 with LVEF=25-30% with akinesis of the anteroapical walls. Chronic LV thrombus. Severe low flow, low gradient aortic stenosis with bicuspid aortic valve with mean gradient 27 mmHg, AVA 0.86 cm2, DI 0.18, SVI 33.   He tells me today that he has been having dizziness for the past year that has gotten progressively worse over the past three months. No chest pain. He has mild dyspnea with exertion. No LE edema. He lives in [place]. 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 No active dental issues.   Primary Care Physician: None Primary Cardiologist: Aundra Dubin Referring Cardiologist: Aundra Dubin  Past Medical History:  Diagnosis Date   Acid reflux    Anxiety    Aortic stenosis with bicuspid valve    CHF (congestive heart failure) (HCC)    Depression    GERD (gastroesophageal reflux disease)    HLD (hyperlipidemia)    Ischemic cardiomyopathy    Migraines    Myocardial infarction [facility])     Past Surgical History:  Procedure Laterality Date   COLONOSCOPY WITH PROPOFOL N/A 07/16/2021   Procedure:  COLONOSCOPY WITH PROPOFOL;  Surgeon: Gatha Mayer, MD;  Location: [facility];  Service: Gastroenterology;  Laterality: N/A;   CORONARY/GRAFT ACUTE MI REVASCULARIZATION N/A 08/28/2019   Procedure: Coronary/Graft Acute MI Revascularization;  Surgeon: [name], MD;  Location: [place] [facility];  Service: Cardiovascular;  Laterality: N/A;   ICD IMPLANT N/A 11/28/2019   Procedure: ICD IMPLANT;  Surgeon: Deboraha Sprang, MD;  Location: [place] [facility];  Service: Cardiovascular;  Laterality: N/A;   LEFT HEART CATH AND CORONARY ANGIOGRAPHY N/A 08/28/2019   Procedure: LEFT HEART CATH AND CORONARY ANGIOGRAPHY;  Surgeon: [name], MD;  Location: [place] [facility];  Service: Cardiovascular;  Laterality: N/A;   POLYPECTOMY  07/16/2021   Procedure: POLYPECTOMY;  Surgeon: Gatha Mayer, MD;  Location: [facility];  Service: Gastroenterology;;   WISDOM TOOTH EXTRACTION      Current Outpatient Medications  Medication Sig Dispense Refill   acetaminophen (TYLENOL) 500 MG tablet Take 500 mg by mouth every 6 (six) hours as needed for headache.     aspirin 81 MG chewable tablet Chew 1 tablet (81 mg total) by mouth daily. 90 tablet 3   atorvastatin (LIPITOR) 80 MG tablet Take 1 tablet (80 mg total) by mouth daily. 30 tablet 11   carvedilol (COREG) 12.5 MG tablet Take 1 tablet (12.5 mg total) by mouth 2 (two) times daily with a meal. 60 tablet 6   dapagliflozin propanediol (FARXIGA) 10 MG TABS tablet TAKE 1 TABLET (10 MG TOTAL) BY MOUTH DAILY  BEFORE BREAKFAST. 30 tablet 11   digoxin (LANOXIN) 0.125 MG tablet Take 1/2 tablet (0.0625 mg total) by mouth daily. 30 tablet 6   ezetimibe (ZETIA) 10 MG tablet Take 1 tablet (10 mg total) by mouth daily. 90 tablet 3   fenofibrate (TRICOR) 145 MG tablet Take 1 tablet (145 mg total) by mouth daily. 90 tablet 3   furosemide (LASIX) 20 MG tablet Take 1 tablet (20 mg total) by mouth as needed. 45 tablet 3   hydrOXYzine (ATARAX) 25 MG tablet Take 1  tablet (25 mg total) by mouth 3 (three) times daily as needed. 90 tablet 3   mirtazapine (REMERON) 45 MG tablet Take 1 tablet (45 mg total) by mouth at bedtime. 30 tablet 3   Multiple Vitamin (MULTIVITAMIN WITH MINERALS) TABS tablet Take 1 tablet by mouth daily.     omega-3 acid ethyl esters (LOVAZA) 1 g capsule TAKE 2 CAPSULES (2 GRAMS TOTAL) BY MOUTH TWO TIMES DAILY. 120 capsule 3   omeprazole (PRILOSEC) 20 MG capsule Take 20 mg by mouth daily as needed (acid reflux).     sacubitril-valsartan (ENTRESTO) 97-103 MG Take 1 tablet by mouth 2 (two) times daily. 60 tablet 11   sertraline (ZOLOFT) 100 MG tablet Take 1.5 tablets (150 mg total) by mouth daily. 45 tablet 3   SILDENAFIL CITRATE PO Take 1-2 tablets by mouth daily as needed (ED). 30 mg tablet - take 1 - 2 tablets 30 to 45 min before sexual activity as needed     spironolactone (ALDACTONE) 25 MG tablet TAKE 1 TABLET (25 MG TOTAL) BY MOUTH DAILY. 30 tablet 11   warfarin (COUMADIN) 5 MG tablet Take 1.5 to 2 tablets by mouth once daily as directed by Anticoagulation clinic 50 tablet 3   No current facility-administered medications for this visit.    No Known Allergies  Social History   Socioeconomic History   Marital status: Married    Spouse name: Not on file   Number of children: 0   Years of education: Not on file   Highest education level: Not on file  Occupational History   Occupation: Probation officer   Occupation: Attorney/Writer  Tobacco Use   Smoking status: Every Day    Packs/day: 0.50    Types: Cigarettes   Smokeless tobacco: Never   Tobacco comments:    x15 yrs as of 2021  Vaping Use   Vaping Use: Never used  Substance and Sexual Activity   Alcohol use: Yes    Comment: a couple of drinks on the weekends   Drug use: Never   Sexual activity: Yes    Birth control/protection: None  Other Topics Concern   Not on file  Social History Narrative   2 adopted children   Social Determinants of Health   Financial Resource  Strain: Not on file  Food Insecurity: Not on file  Transportation Needs: Not on file  Physical Activity: Not on file  Stress: Not on file  Social Connections: Not on file  Intimate Partner Violence: Not on file    Family History  Problem Relation Age of Onset   Cancer Mother        peritoneal   Arrhythmia Father    Prostate cancer Father    Colon cancer Brother 47   Cancer Maternal Grandmother        type unknown   Heart attack Maternal Grandfather    Diabetes Paternal Grandmother    Heart attack Paternal Grandfather     Review of Systems:  As stated in the HPI and otherwise negative.   BP 110/78   Pulse 81   Ht '5\' 9"'$  (1.753 m)   Wt 184 lb (83.5 kg)   SpO2 96%   BMI 27.17 kg/m   Physical Examination: General: Well developed, well nourished, NAD  HEENT: OP clear, mucus membranes moist  SKIN: warm, dry. No rashes. Neuro: No focal deficits  Musculoskeletal: Muscle strength 5/5 all ext  Psychiatric: Mood and affect normal  Neck: No JVD, no carotid bruits, no thyromegaly, no lymphadenopathy.  Lungs:Clear bilaterally, no wheezes, rhonci, crackles Cardiovascular: Regular rate and rhythm. Soft systolic murmur.   Abdomen:Soft. Bowel sounds present. Non-tender.  Extremities:  No lower extremity edema. Pulses are 2 + in the bilateral DP/PT.  EKG:  EKG is not ordered today. The ekg ordered today demonstrates   Echo 11/20/21:  1. Left ventricular ejection fraction, by estimation, is 25 to 30%. The  left ventricle has severely decreased function. The left ventricle  demonstrates regional wall motion abnormalities with mid to apical  inferoseptal and anteroseptal akinesis,  akinesis of the apical anterior/inferior/lateral walls and the true apex.  There was chronic-appearing LV thrombus at the apex. There is mild left  ventricular hypertrophy. Left ventricular diastolic parameters are  consistent with Grade I diastolic  dysfunction (impaired relaxation).   2. Right  ventricular systolic function is normal. The right ventricular  size is normal.   3. The mitral valve is normal in structure. Trivial mitral valve  regurgitation. No evidence of mitral stenosis.   4. The aortic valve is bicuspid. There is moderate calcification of the  aortic valve. Aortic valve regurgitation is not visualized. Severe low  flow low gradient aortic valve stenosis. Aortic valve area, by VTI  measures 0.88 cm. Aortic valve mean  gradient measures 27.0 mmHg.   5. Aortic dilatation noted. There is mild dilatation of the ascending  aorta, measuring 37 mm.   6. The inferior vena cava is normal in size with greater than 50%  respiratory variability, suggesting right atrial pressure of 3 mmHg.   FINDINGS   Left Ventricle: Left ventricular ejection fraction, by estimation, is 25  to 30%. The left ventricle has severely decreased function. The left  ventricle demonstrates regional wall motion abnormalities. The left  ventricular internal cavity size was normal   in size. There is mild left ventricular hypertrophy. Left ventricular  diastolic parameters are consistent with Grade I diastolic dysfunction  (impaired relaxation).   Right Ventricle: The right ventricular size is normal. No increase in  right ventricular wall thickness. Right ventricular systolic function is  normal.   Left Atrium: Left atrial size was normal in size.   Right Atrium: Right atrial size was normal in size.   Pericardium: There is no evidence of pericardial effusion.   Mitral Valve: The mitral valve is normal in structure. Trivial mitral  valve regurgitation. No evidence of mitral valve stenosis.   Tricuspid Valve: The tricuspid valve is normal in structure. Tricuspid  valve regurgitation is not demonstrated.   Aortic Valve: The aortic valve is bicuspid. There is moderate  calcification of the aortic valve. Aortic valve regurgitation is not  visualized. Severe aortic stenosis is present. Aortic  valve mean gradient  measures 27.0 mmHg. Aortic valve peak gradient  measures 44.1 mmHg. Aortic valve area, by VTI measures 0.88 cm.   Pulmonic Valve: The pulmonic valve was normal in structure. Pulmonic valve  regurgitation is not visualized.   Aorta: Aortic dilatation noted. There is mild  dilatation of the ascending  aorta, measuring 37 mm.   Venous: The inferior vena cava is normal in size with greater than 50%  respiratory variability, suggesting right atrial pressure of 3 mmHg.   IAS/Shunts: No atrial level shunt detected by color flow Doppler.   Additional Comments: A device lead is visualized in the right ventricle.     LEFT VENTRICLE  PLAX 2D  LVIDd:         5.30 cm   Diastology  LVIDs:         3.50 cm   LV e' medial:    6.60 cm/s  LV PW:         0.90 cm   LV E/e' medial:  9.0  LV IVS:        0.90 cm   LV e' lateral:   5.18 cm/s  LVOT diam:     2.50 cm   LV E/e' lateral: 11.5  LV SV:         67  LV SV Index:   33  LVOT Area:     4.91 cm                             3D Volume EF:                           3D EF:        37 %                           LV EDV:       263 ml                           LV ESV:       165 ml                           LV SV:        98 ml   RIGHT VENTRICLE  RV S prime:     37.90 cm/s  TAPSE (M-mode): 1.6 cm   LEFT ATRIUM             Index  LA Vol (A2C):   48.7 ml 24.23 ml/m  LA Vol (A4C):   59.8 ml 29.76 ml/m  LA Biplane Vol: 59.8 ml 29.76 ml/m   AORTIC VALVE  AV Area (Vmax):    0.86 cm  AV Area (Vmean):   0.94 cm  AV Area (VTI):     0.88 cm  AV Vmax:           332.00 cm/s  AV Vmean:          237.400 cm/s  AV VTI:            0.755 m  AV Peak Grad:      44.1 mmHg  AV Mean Grad:      27.0 mmHg  LVOT Vmax:         58.20 cm/s  LVOT Vmean:        45.700 cm/s  LVOT VTI:          0.136 m  LVOT/AV VTI ratio: 0.18    AORTA  Ao Asc diam: 3.70 cm   MITRAL VALVE  MV Area (PHT): 4.99 cm    SHUNTS  MV Decel Time:  152 msec     Systemic VTI:  0.14 m  MV E velocity: 59.60 cm/s  Systemic Diam: 2.50 cm  MV A velocity: 84.20 cm/s  MV E/A ratio:  0.71   Recent Labs: 01/08/2021: NT-Pro BNP 120 03/26/2021: Hemoglobin 14.7; Platelets 246 11/20/2021: B Natriuretic Peptide 203.0; BUN 30; Creatinine, Ser 1.38; Potassium 4.3; Sodium 137    Wt Readings from Last 3 Encounters:  12/02/21 184 lb (83.5 kg)  11/20/21 184 lb 9.6 oz (83.7 kg)  09/13/21 187 lb 6.4 oz (85 kg)    Assessment and Plan:   1. Severe Aortic Valve Stenosis: He has stage D2 severe low flow/low gradient aortic valve stenosis. NYHA class 2-3 . I have personally reviewed the echo images. The aortic valve is thickened, calcified with limited leaflet mobility. I think he would benefit from AVR. He may end up being a better candidate for surgical AVR than TAVR given the LV thrombus and risk for an embolic event with LV wire used during TAVR. He is young with a bicuspid valve. The biggest risk factor for surgery is his LV dysfunction. If we elect for surgical AVR, can consider a mechanical AVR given his young age and need for chronic coumadin therapy for the LV thrombus. I think he may be a poor candidate for TAVR given the the LV thrombus.    I have reviewed the natural history of aortic stenosis with the patient and their family members  who are present today. We have discussed the limitations of medical therapy and the poor prognosis associated with symptomatic aortic stenosis. We have reviewed potential treatment options, including palliative medical therapy, conventional surgical aortic valve replacement, and transcatheter aortic valve replacement. We discussed treatment options in the context of the patient's specific comorbid medical conditions.   He would like to proceed with planning for TAVR. I will arrange a right and left heart catheterization at [facility] 12/12/21 at 9am. Risks and benefits of the cath procedure and the valve procedure are reviewed with the  patient. After the cath, he will have a cardiac CT, CTA of the chest/abdomen and pelvis and will then be referred to see one of the CT surgeons on our TAVR team.   BMET and CBC today He will need a Lovenox bridge while holding his coumadin for cardiac cath.     Labs/ tests ordered today include:   Orders Placed This Encounter  Procedures   Basic metabolic panel   CBC   Disposition:   F/U as above.   Signed, Lauree Chandler, MD, [facility] 12/02/2021 2:46 PM    [address], [place], [place]  [address] Phone: [phone]; Fax: [phone]

## 2021-12-03 LAB — CBC
Hematocrit: 45.9 % (ref 37.5–51.0)
Hemoglobin: 15.4 g/dL (ref 13.0–17.7)
MCH: 30.9 pg (ref 26.6–33.0)
MCHC: 33.6 g/dL (ref 31.5–35.7)
MCV: 92 fL (ref 79–97)
Platelets: 322 10*3/uL (ref 150–450)
RBC: 4.99 x10E6/uL (ref 4.14–5.80)
RDW: 14.4 % (ref 11.6–15.4)
WBC: 8.7 10*3/uL (ref 3.4–10.8)

## 2021-12-03 LAB — BASIC METABOLIC PANEL
BUN/Creatinine Ratio: 19 (ref 9–20)
BUN: 23 mg/dL (ref 6–24)
CO2: 24 mmol/L (ref 20–29)
Calcium: 10.5 mg/dL — ABNORMAL HIGH (ref 8.7–10.2)
Chloride: 101 mmol/L (ref 96–106)
Creatinine, Ser: 1.22 mg/dL (ref 0.76–1.27)
Glucose: 91 mg/dL (ref 70–99)
Potassium: 5.1 mmol/L (ref 3.5–5.2)
Sodium: 140 mmol/L (ref 134–144)
eGFR: 70 mL/min/{1.73_m2} (ref >59)

## 2021-12-04 ENCOUNTER — Other Ambulatory Visit: Payer: Self-pay

## 2021-12-05 ENCOUNTER — Other Ambulatory Visit: Payer: Self-pay

## 2021-12-05 ENCOUNTER — Ambulatory Visit: Payer: Medicaid Other | Attending: Cardiovascular Disease | Admitting: *Deleted

## 2021-12-05 DIAGNOSIS — I513 Intracardiac thrombosis, not elsewhere classified: Secondary | ICD-10-CM | POA: Diagnosis not present

## 2021-12-05 DIAGNOSIS — Z5181 Encounter for therapeutic drug level monitoring: Secondary | ICD-10-CM | POA: Diagnosis not present

## 2021-12-05 DIAGNOSIS — Z7901 Long term (current) use of anticoagulants: Secondary | ICD-10-CM | POA: Diagnosis not present

## 2021-12-05 LAB — POCT INR: INR: 2.8 (ref 2.0–3.0)

## 2021-12-05 MED ORDER — ENOXAPARIN SODIUM 80 MG/0.8ML IJ SOSY
80.0000 mg | PREFILLED_SYRINGE | Freq: Two times a day (BID) | INTRAMUSCULAR | 1 refills | Status: DC
  Filled 2021-12-05: qty 16, 10d supply, fill #0
  Filled 2021-12-18: qty 16, 10d supply, fill #1

## 2021-12-05 NOTE — Patient Instructions (Addendum)
  Description   Today take 2 tablets then start taking warfarin 2 tablets daily except for 1.5 tablets on Tuesdays, Thursdays and Saturdays. Recheck INR 1 week post procedure.  C[facility]in Clinic [phone]  [place] [id number]     11/10: Last dose of warfarin.  11/11: No warfarin or enoxaparin (Lovenox).  11/12: Inject enoxaparin '80mg'$  in the fatty abdominal tissue at least 2 inches from the belly button twice a day about 12 hours apart, 8am and 8pm rotate sites. No warfarin.  11/13: Inject enoxaparin in the fatty tissue every 12 hours, 8am and 8pm. No warfarin.  11/14: Inject enoxaparin in the fatty tissue every 12 hours, 8am and 8pm. No warfarin.  11/15: Inject enoxaparin in the fatty tissue in the morning at 8 am (No PM dose). No warfarin.  11/16: Procedure Day - No enoxaparin - Resume warfarin in the evening or as directed by doctor and take an extra 1/2 tablet of warfarin.   11/17: Resume enoxaparin inject in the fatty tissue every 12 hours and take warfarin and take an extra 1/2 tablet.   11/18: Inject enoxaparin in the fatty tissue every 12 hours and take warfarin  11/19: Inject enoxaparin in the fatty tissue every 12 hours and take warfarin  11/20: Inject enoxaparin in the fatty tissue every 12 hours and take warfarin  11/21: Inject enoxaparin in the fatty tissue every 12 hours and take warfarin  11/22: Inject enoxaparin in the fatty tissue in the morning and take warfarin. Warfarin appt to check INR.

## 2021-12-06 ENCOUNTER — Other Ambulatory Visit: Payer: Self-pay

## 2021-12-10 ENCOUNTER — Telehealth: Payer: Self-pay | Admitting: *Deleted

## 2021-12-10 NOTE — Telephone Encounter (Signed)
Cardiac Catheterization scheduled at [facility] for: Thursday December 12, 2021 9 AM Arrival time and place: [facility] Main Entrance A at: 7 AM  Nothing to eat after midnight prior to procedure, clear liquids until 5 AM day of procedure.  Medication instructions: -Hold: Coumadin none 12/07/21 until post procedure/Lovenox bridge-see 12/05/21 Anti-coag visit note.  Lasix/Spironolactone-AM of procedure  Farxiga-AM of procedure -Except hold medications usual morning medications can be taken with sips of water including aspirin 81 mg.  Confirmed patient has responsible adult to drive home post procedure and be with patient first 24 hours after arriving home.  Patient reports no new symptoms concerning for COVID-19 in the past 10 days.  Reviewed procedure instructions with patient.

## 2021-12-11 NOTE — Progress Notes (Signed)
Remote ICD transmission.   

## 2021-12-12 ENCOUNTER — Encounter (HOSPITAL_COMMUNITY)
Admission: RE | Disposition: A | Discharge status: Home / Self Care | Payer: Medicaid Other | Source: Home / Self Care | Attending: Cardiovascular Disease

## 2021-12-12 ENCOUNTER — Encounter (HOSPITAL_COMMUNITY): Payer: Self-pay | Admitting: Cardiovascular Disease

## 2021-12-12 ENCOUNTER — Other Ambulatory Visit: Payer: Self-pay

## 2021-12-12 ENCOUNTER — Other Ambulatory Visit: Payer: Self-pay | Admitting: Physician Assistant

## 2021-12-12 ENCOUNTER — Ambulatory Visit (HOSPITAL_COMMUNITY)
Admission: RE | Admit: 2021-12-12 | Discharge: 2021-12-12 | Disposition: A | Discharge status: Home / Self Care | Payer: Medicaid Other | Attending: Cardiovascular Disease | Admitting: Cardiovascular Disease

## 2021-12-12 DIAGNOSIS — E785 Hyperlipidemia, unspecified: Secondary | ICD-10-CM | POA: Diagnosis not present

## 2021-12-12 DIAGNOSIS — Z95810 Presence of automatic (implantable) cardiac defibrillator: Secondary | ICD-10-CM | POA: Diagnosis not present

## 2021-12-12 DIAGNOSIS — I2510 Atherosclerotic heart disease of native coronary artery without angina pectoris: Secondary | ICD-10-CM | POA: Diagnosis not present

## 2021-12-12 DIAGNOSIS — I252 Old myocardial infarction: Secondary | ICD-10-CM | POA: Insufficient documentation

## 2021-12-12 DIAGNOSIS — I2582 Chronic total occlusion of coronary artery: Secondary | ICD-10-CM | POA: Diagnosis not present

## 2021-12-12 DIAGNOSIS — F419 Anxiety disorder, unspecified: Secondary | ICD-10-CM | POA: Insufficient documentation

## 2021-12-12 DIAGNOSIS — I255 Ischemic cardiomyopathy: Secondary | ICD-10-CM | POA: Insufficient documentation

## 2021-12-12 DIAGNOSIS — K219 Gastro-esophageal reflux disease without esophagitis: Secondary | ICD-10-CM | POA: Insufficient documentation

## 2021-12-12 DIAGNOSIS — I350 Nonrheumatic aortic (valve) stenosis: Secondary | ICD-10-CM

## 2021-12-12 DIAGNOSIS — F17210 Nicotine dependence, cigarettes, uncomplicated: Secondary | ICD-10-CM | POA: Diagnosis not present

## 2021-12-12 DIAGNOSIS — Z01812 Encounter for preprocedural laboratory examination: Secondary | ICD-10-CM

## 2021-12-12 DIAGNOSIS — F32A Depression, unspecified: Secondary | ICD-10-CM | POA: Diagnosis not present

## 2021-12-12 DIAGNOSIS — I5022 Chronic systolic (congestive) heart failure: Secondary | ICD-10-CM | POA: Diagnosis not present

## 2021-12-12 HISTORY — PX: RIGHT HEART CATH AND CORONARY ANGIOGRAPHY: CATH118264

## 2021-12-12 LAB — POCT I-STAT EG7
Acid-base deficit: 3 mmol/L — ABNORMAL HIGH (ref 0.0–2.0)
Bicarbonate: 21.4 mmol/L (ref 20.0–28.0)
Calcium, Ion: 1.03 mmol/L — ABNORMAL LOW (ref 1.15–1.40)
HCT: 35 % — ABNORMAL LOW (ref 39.0–52.0)
Hemoglobin: 11.9 g/dL — ABNORMAL LOW (ref 13.0–17.0)
O2 Saturation: 71 %
Potassium: 3.8 mmol/L (ref 3.5–5.1)
Sodium: 144 mmol/L (ref 135–145)
TCO2: 23 mmol/L (ref 22–32)
pCO2, Ven: 36.6 mmHg — ABNORMAL LOW (ref 44–60)
pH, Ven: 7.375 (ref 7.25–7.43)
pO2, Ven: 38 mmHg (ref 32–45)

## 2021-12-12 LAB — POCT I-STAT 7, (LYTES, BLD GAS, ICA,H+H)
Acid-base deficit: 2 mmol/L (ref 0.0–2.0)
Bicarbonate: 22.3 mmol/L (ref 20.0–28.0)
Calcium, Ion: 1.23 mmol/L (ref 1.15–1.40)
HCT: 38 % — ABNORMAL LOW (ref 39.0–52.0)
Hemoglobin: 12.9 g/dL — ABNORMAL LOW (ref 13.0–17.0)
O2 Saturation: 96 %
Potassium: 4.2 mmol/L (ref 3.5–5.1)
Sodium: 139 mmol/L (ref 135–145)
TCO2: 23 mmol/L (ref 22–32)
pCO2 arterial: 36.8 mmHg (ref 32–48)
pH, Arterial: 7.39 (ref 7.35–7.45)
pO2, Arterial: 81 mmHg — ABNORMAL LOW (ref 83–108)

## 2021-12-12 LAB — PROTIME-INR
INR: 1 (ref 0.8–1.2)
Prothrombin Time: 13 seconds (ref 11.4–15.2)

## 2021-12-12 SURGERY — RIGHT HEART CATH AND CORONARY ANGIOGRAPHY
Anesthesia: LOCAL

## 2021-12-12 MED ORDER — ASPIRIN 81 MG PO CHEW: 81.0000 mg | CHEWABLE_TABLET | ORAL | Status: DC

## 2021-12-12 MED ORDER — SODIUM CHLORIDE 0.9 % IV SOLN: 250.0000 mL | INTRAVENOUS | Status: DC | PRN

## 2021-12-12 MED ORDER — HEPARIN SODIUM (PORCINE) 1000 UNIT/ML IJ SOLN
INTRAMUSCULAR | Status: DC | PRN
  Administered 2021-12-12: 4000 [IU] via INTRAVENOUS

## 2021-12-12 MED ORDER — LIDOCAINE HCL (PF) 1 % IJ SOLN
INTRAMUSCULAR | Status: DC | PRN
  Administered 2021-12-12: 2 mL via INTRADERMAL

## 2021-12-12 MED ORDER — SODIUM CHLORIDE 0.9 % WEIGHT BASED INFUSION: 1.0000 mL/kg/h | INTRAVENOUS | Status: DC

## 2021-12-12 MED ORDER — SODIUM CHLORIDE 0.9 % IV SOLN: INTRAVENOUS | Status: AC

## 2021-12-12 MED ORDER — ACETAMINOPHEN 325 MG PO TABS: 650.0000 mg | ORAL_TABLET | ORAL | Status: DC | PRN

## 2021-12-12 MED ORDER — HEPARIN (PORCINE) IN NACL 1000-0.9 UT/500ML-% IV SOLN
INTRAVENOUS | Status: AC
  Filled 2021-12-12: qty 1000

## 2021-12-12 MED ORDER — LIDOCAINE HCL (PF) 1 % IJ SOLN
INTRAMUSCULAR | Status: AC
  Filled 2021-12-12: qty 30

## 2021-12-12 MED ORDER — HEPARIN (PORCINE) IN NACL 1000-0.9 UT/500ML-% IV SOLN
INTRAVENOUS | Status: DC | PRN
  Administered 2021-12-12 (×2): 500 mL

## 2021-12-12 MED ORDER — LABETALOL HCL 5 MG/ML IV SOLN: 10.0000 mg | INTRAVENOUS | Status: DC | PRN

## 2021-12-12 MED ORDER — IOHEXOL 350 MG/ML SOLN
INTRAVENOUS | Status: DC | PRN
  Administered 2021-12-12: 25 mL

## 2021-12-12 MED ORDER — SODIUM CHLORIDE 0.9% FLUSH: 3.0000 mL | INTRAVENOUS | Status: DC | PRN

## 2021-12-12 MED ORDER — SODIUM CHLORIDE 0.9 % WEIGHT BASED INFUSION
3.0000 mL/kg/h | INTRAVENOUS | Status: AC
  Administered 2021-12-12: 3 mL/kg/h via INTRAVENOUS

## 2021-12-12 MED ORDER — VERAPAMIL HCL 2.5 MG/ML IV SOLN
INTRAVENOUS | Status: DC | PRN
  Administered 2021-12-12: 10 mL via INTRA_ARTERIAL

## 2021-12-12 MED ORDER — HYDRALAZINE HCL 20 MG/ML IJ SOLN: 10.0000 mg | INTRAMUSCULAR | Status: DC | PRN

## 2021-12-12 MED ORDER — VERAPAMIL HCL 2.5 MG/ML IV SOLN
INTRAVENOUS | Status: AC
  Filled 2021-12-12: qty 2

## 2021-12-12 MED ORDER — ONDANSETRON HCL 4 MG/2ML IJ SOLN: 4.0000 mg | Freq: Four times a day (QID) | INTRAMUSCULAR | Status: DC | PRN

## 2021-12-12 MED ORDER — HEPARIN SODIUM (PORCINE) 1000 UNIT/ML IJ SOLN
INTRAMUSCULAR | Status: AC
  Filled 2021-12-12: qty 10

## 2021-12-12 MED ORDER — SODIUM CHLORIDE 0.9% FLUSH: 3.0000 mL | Freq: Two times a day (BID) | INTRAVENOUS | Status: DC

## 2021-12-12 SURGICAL SUPPLY — 11 items
CATH 5FR JL3.5 JR4 ANG PIG MP (CATHETERS) IMPLANT
CATH SWAN GANZ 7F STRAIGHT (CATHETERS) IMPLANT
DEVICE RAD COMP TR BAND LRG (VASCULAR PRODUCTS) IMPLANT
GLIDESHEATH SLEND SS 6F .021 (SHEATH) IMPLANT
GLIDESHEATH SLENDER 7FR .021G (SHEATH) IMPLANT
GUIDEWIRE INQWIRE 1.5J.035X260 (WIRE) IMPLANT
INQWIRE 1.5J .035X260CM (WIRE) ×1
KIT HEART LEFT (KITS) ×1 IMPLANT
PACK CARDIAC CATHETERIZATION (CUSTOM PROCEDURE TRAY) ×1 IMPLANT
TRANSDUCER W/STOPCOCK (MISCELLANEOUS) ×1 IMPLANT
TUBING CIL FLEX 10 FLL-RA (TUBING) ×1 IMPLANT

## 2021-12-12 NOTE — Discharge Instructions (Signed)
Resume coumadin tonight. Follow protocol for Lovenox bridging as outline in the pre-cath instructions.

## 2021-12-12 NOTE — Progress Notes (Signed)
TR band removed and new dressing placed, arm brace in place. No bleeding or hematoma noted. Will continue to monitor.

## 2021-12-12 NOTE — Interval H&P Note (Signed)
History and Physical Interval Note:  12/12/2021 7:45 AM  Mahlon Gammon Dicie Beam  has presented today for surgery, with the diagnosis of severe aortic stenosis.  The various methods of treatment have been discussed with the patient and family. After consideration of risks, benefits and other options for treatment, the patient has consented to  Procedure(s): RIGHT/LEFT HEART CATH AND CORONARY ANGIOGRAPHY (N/A) as a surgical intervention.  The patient's history has been reviewed, patient examined, no change in status, stable for surgery.  I have reviewed the patient's chart and labs.  Questions were answered to the patient's satisfaction.   Cath Lab Visit (complete for each Cath Lab visit)  Clinical Evaluation Leading to the Procedure:   ACS: No.  Non-ACS:    Anginal Classification: CCS II  Anti-ischemic medical therapy: Minimal Therapy (1 class of medications)  Non-Invasive Test Results: No non-invasive testing performed  Prior CABG: No previous CABG        Craig Flores

## 2021-12-15 ENCOUNTER — Other Ambulatory Visit: Payer: Self-pay | Admitting: Cardiology

## 2021-12-15 ENCOUNTER — Other Ambulatory Visit (HOSPITAL_COMMUNITY): Payer: Self-pay | Admitting: Cardiology

## 2021-12-16 ENCOUNTER — Encounter: Payer: Self-pay | Admitting: Physician Assistant

## 2021-12-16 ENCOUNTER — Other Ambulatory Visit: Payer: Self-pay

## 2021-12-16 MED ORDER — SPIRONOLACTONE 25 MG PO TABS
25.0000 mg | ORAL_TABLET | Freq: Every day | ORAL | 11 refills | Status: DC
  Filled 2021-12-16: qty 30, 30d supply, fill #0
  Filled 2022-01-13: qty 30, 30d supply, fill #1
  Filled 2022-02-10: qty 30, 30d supply, fill #2

## 2021-12-16 MED ORDER — EZETIMIBE 10 MG PO TABS
10.0000 mg | ORAL_TABLET | Freq: Every day | ORAL | 0 refills | Status: DC
  Filled 2021-12-16: qty 90, 90d supply, fill #0

## 2021-12-18 ENCOUNTER — Other Ambulatory Visit: Payer: Self-pay

## 2021-12-18 ENCOUNTER — Ambulatory Visit: Payer: Medicaid Other | Attending: Cardiology | Admitting: *Deleted

## 2021-12-18 ENCOUNTER — Other Ambulatory Visit: Payer: Self-pay | Admitting: Cardiology

## 2021-12-18 DIAGNOSIS — Z7901 Long term (current) use of anticoagulants: Secondary | ICD-10-CM

## 2021-12-18 DIAGNOSIS — I513 Intracardiac thrombosis, not elsewhere classified: Secondary | ICD-10-CM

## 2021-12-18 DIAGNOSIS — Z5181 Encounter for therapeutic drug level monitoring: Secondary | ICD-10-CM

## 2021-12-18 LAB — POCT INR: INR: 1.9 — AB (ref 2.0–3.0)

## 2021-12-18 NOTE — Patient Instructions (Addendum)
Description   Continue Lovenox injections twice a day until Saturday pm. Today take 3 tablets and tomorrow take 2 tablets then start taking the dose you should be taking warfarin 2 tablets daily except for 1.5 tablets on Tuesdays, Thursdays and Saturdays. Recheck INR 6 days.  C[facility]madin Clinic [phone]  [place] [id number]

## 2021-12-20 ENCOUNTER — Other Ambulatory Visit: Payer: Self-pay

## 2021-12-20 ENCOUNTER — Ambulatory Visit (HOSPITAL_COMMUNITY)
Admission: RE | Admit: 2021-12-20 | Discharge: 2021-12-20 | Disposition: A | Discharge status: Home / Self Care | Payer: Medicaid Other | Source: Ambulatory Visit | Attending: Physician Assistant | Admitting: Physician Assistant

## 2021-12-20 DIAGNOSIS — I700 Atherosclerosis of aorta: Secondary | ICD-10-CM | POA: Diagnosis not present

## 2021-12-20 DIAGNOSIS — Z01818 Encounter for other preprocedural examination: Secondary | ICD-10-CM | POA: Diagnosis not present

## 2021-12-20 DIAGNOSIS — I350 Nonrheumatic aortic (valve) stenosis: Secondary | ICD-10-CM | POA: Insufficient documentation

## 2021-12-20 DIAGNOSIS — I771 Stricture of artery: Secondary | ICD-10-CM | POA: Diagnosis not present

## 2021-12-20 MED ORDER — IOHEXOL 350 MG/ML SOLN
100.0000 mL | Freq: Once | INTRAVENOUS | Status: AC | PRN
  Administered 2021-12-20: 100 mL via INTRAVENOUS

## 2021-12-20 NOTE — Progress Notes (Signed)
Pt. States, "My blood pressure always runs low. I feel fine." Pt. Not symptomatic.

## 2021-12-23 ENCOUNTER — Ambulatory Visit: Payer: Medicaid Other | Attending: Cardiology | Admitting: *Deleted

## 2021-12-23 ENCOUNTER — Other Ambulatory Visit: Payer: Self-pay

## 2021-12-23 DIAGNOSIS — I513 Intracardiac thrombosis, not elsewhere classified: Secondary | ICD-10-CM | POA: Diagnosis not present

## 2021-12-23 DIAGNOSIS — Z7901 Long term (current) use of anticoagulants: Secondary | ICD-10-CM | POA: Diagnosis not present

## 2021-12-23 DIAGNOSIS — Z5181 Encounter for therapeutic drug level monitoring: Secondary | ICD-10-CM | POA: Diagnosis not present

## 2021-12-23 LAB — POCT INR: INR: 3.2 — AB (ref 2.0–3.0)

## 2021-12-23 MED ORDER — ATORVASTATIN CALCIUM 80 MG PO TABS
80.0000 mg | ORAL_TABLET | Freq: Every day | ORAL | 11 refills | Status: DC
  Filled 2021-12-23: qty 30, 30d supply, fill #0
  Filled 2022-01-19 – 2022-01-21 (×2): qty 30, 30d supply, fill #1
  Filled 2022-02-15 – 2022-02-18 (×2): qty 30, 30d supply, fill #2
  Filled 2022-03-23: qty 30, 30d supply, fill #3
  Filled 2022-04-20: qty 30, 30d supply, fill #4
  Filled 2022-05-25: qty 30, 30d supply, fill #5
  Filled 2022-06-23: qty 30, 30d supply, fill #6
  Filled 2022-07-26: qty 30, 30d supply, fill #7
  Filled 2022-08-25: qty 30, 30d supply, fill #8
  Filled 2022-09-20: qty 30, 30d supply, fill #9
  Filled 2022-10-20: qty 30, 30d supply, fill #10
  Filled 2022-11-23: qty 30, 30d supply, fill #11

## 2021-12-23 NOTE — Patient Instructions (Addendum)
Description   Stop Lovenox. Continue taking warfarin 2 tablets daily except for 1.5 tablets on Tuesdays, Thursdays and Saturdays. Recheck INR 2 weeks. Coumadin Clinic [phone]  [place] [id number]

## 2021-12-29 ENCOUNTER — Other Ambulatory Visit (HOSPITAL_COMMUNITY): Payer: Self-pay | Admitting: Cardiology

## 2021-12-30 ENCOUNTER — Other Ambulatory Visit: Payer: Self-pay

## 2021-12-30 MED ORDER — DAPAGLIFLOZIN PROPANEDIOL 10 MG PO TABS
10.0000 mg | ORAL_TABLET | Freq: Every day | ORAL | 11 refills | Status: DC
  Filled 2021-12-30: qty 30, 30d supply, fill #0
  Filled 2022-01-28: qty 30, 30d supply, fill #1

## 2022-01-05 NOTE — Progress Notes (Unsigned)
[address]       [place] [address]             [phone]           Craig Flores [facility] Medical Record #[id number] Date of Birth: Mar 16, 1965  Craig Dresser, MD Pcp, No  Chief Complaint:   No chief complaint on file.   History of Present Illness:           Past Medical History:  Diagnosis Date   Acid reflux    Anxiety    Aortic stenosis with bicuspid valve    CHF (congestive heart failure) (HCC)    Depression    GERD (gastroesophageal reflux disease)    HLD (hyperlipidemia)    Ischemic cardiomyopathy    Migraines    Myocardial infarction [facility])     Past Surgical History:  Procedure Laterality Date   COLONOSCOPY WITH PROPOFOL N/A 07/16/2021   Procedure: COLONOSCOPY WITH PROPOFOL;  Surgeon: Gatha Mayer, MD;  Location: [facility];  Service: Gastroenterology;  Laterality: N/A;   CORONARY/GRAFT ACUTE MI REVASCULARIZATION N/A 08/28/2019   Procedure: Coronary/Graft Acute MI Revascularization;  Surgeon: [name], MD;  Location: [facility];  Service: Cardiovascular;  Laterality: N/A;   ICD IMPLANT N/A 11/28/2019   Procedure: ICD IMPLANT;  Surgeon: Deboraha Sprang, MD;  Location: [facility];  Service: Cardiovascular;  Laterality: N/A;   LEFT HEART CATH AND CORONARY ANGIOGRAPHY N/A 08/28/2019   Procedure: LEFT HEART CATH AND CORONARY ANGIOGRAPHY;  Surgeon: [name], MD;  Location: [facility];  Service: Cardiovascular;  Laterality: N/A;   POLYPECTOMY  07/16/2021   Procedure: POLYPECTOMY;  Surgeon: Gatha Mayer, MD;  Location: [facility];  Service: Gastroenterology;;   RIGHT HEART CATH AND CORONARY ANGIOGRAPHY N/A 12/12/2021   Procedure: RIGHT HEART CATH AND CORONARY ANGIOGRAPHY;  Surgeon: Burnell Blanks, MD;  Location: [facility];  Service: Cardiovascular;  Laterality: N/A;   WISDOM TOOTH EXTRACTION      Social History   Tobacco Use  Smoking Status Every Day    Packs/day: 0.50   Types: Cigarettes  Smokeless Tobacco Never  Tobacco Comments   x15 yrs as of 2021    Social History   Substance and Sexual Activity  Alcohol Use Yes   Comment: a couple of drinks on the weekends    Social History   Socioeconomic History   Marital status: Married    Spouse name: Not on file   Number of children: 0   Years of education: Not on file   Highest education level: Not on file  Occupational History   Occupation: Probation officer   Occupation: Attorney/Writer  Tobacco Use   Smoking status: Every Day    Packs/day: 0.50    Types: Cigarettes   Smokeless tobacco: Never   Tobacco comments:    x15 yrs as of 2021  Vaping Use   Vaping Use: Never used  Substance and Sexual Activity   Alcohol use: Yes    Comment: a couple of drinks on the weekends   Drug use: Never   Sexual activity: Yes    Birth control/protection: None  Other Topics Concern   Not on file  Social History Narrative   2 adopted children   Social Determinants of Health   Financial Resource Strain: Not on file  Food Insecurity: Not on file  Transportation Needs: Not on file  Physical Activity: Not on file  Stress: Not on file  Social Connections: Not on file  Intimate Partner Violence: Not on file    No Known Allergies  Current Outpatient Medications  Medication Sig Dispense Refill   acetaminophen (TYLENOL) 500 MG tablet Take 500 mg by mouth every 6 (six) hours as needed for headache.     aspirin 81 MG chewable tablet Chew 1 tablet (81 mg total) by mouth daily. 90 tablet 3   atorvastatin (LIPITOR) 80 MG tablet Take 1 tablet (80 mg total) by mouth daily. 30 tablet 11   carvedilol (COREG) 12.5 MG tablet Take 1 tablet (12.5 mg total) by mouth 2 (two) times daily with a meal. 60 tablet 6   dapagliflozin propanediol (FARXIGA) 10 MG TABS tablet Take 1 tablet (10 mg total) by mouth daily before breakfast. 30 tablet 11   digoxin (LANOXIN) 0.125 MG tablet Take 1/2 tablet (0.0625 mg total) by  mouth daily. 30 tablet 6   enoxaparin (LOVENOX) 80 MG/0.8ML injection Inject 0.8 mLs (80 mg total) into the skin every 12 (twelve) hours. 16 mL 1   ezetimibe (ZETIA) 10 MG tablet Take 1 tablet (10 mg total) by mouth daily. 90 tablet 0   fenofibrate (TRICOR) 145 MG tablet Take 1 tablet (145 mg total) by mouth daily. 90 tablet 3   furosemide (LASIX) 20 MG tablet Take 1 tablet (20 mg total) by mouth as needed. (Patient taking differently: Take 20 mg by mouth daily as needed for edema.) 45 tablet 3   hydrOXYzine (ATARAX) 25 MG tablet Take 1 tablet (25 mg total) by mouth 3 (three) times daily as needed. 90 tablet 3   melatonin 5 MG TABS Take 10 mg by mouth at bedtime.     mirtazapine (REMERON) 45 MG tablet Take 1 tablet (45 mg total) by mouth at bedtime. 30 tablet 3   Multiple Vitamin (MULTIVITAMIN WITH MINERALS) TABS tablet Take 1 tablet by mouth daily.     omega-3 acid ethyl esters (LOVAZA) 1 g capsule TAKE 2 CAPSULES (2 GRAMS TOTAL) BY MOUTH TWO TIMES DAILY. 120 capsule 3   omeprazole (PRILOSEC) 20 MG capsule Take 20 mg by mouth daily as needed (acid reflux).     Probiotic Product (PROBIOTIC DAILY PO) Take 1 capsule by mouth daily.     sacubitril-valsartan (ENTRESTO) 97-103 MG Take 1 tablet by mouth 2 (two) times daily. 60 tablet 11   sertraline (ZOLOFT) 100 MG tablet Take 1.5 tablets (150 mg total) by mouth daily. 45 tablet 3   SILDENAFIL CITRATE PO Take 1-2 tablets by mouth daily as needed (ED). 30 mg tablet - take 1 - 2 tablets 30 to 45 min before sexual activity as needed     spironolactone (ALDACTONE) 25 MG tablet Take 1 tablet (25 mg total) by mouth daily. 30 tablet 11   warfarin (COUMADIN) 5 MG tablet Take 1.5 to 2 tablets by mouth once daily as directed by Anticoagulation clinic (Patient taking differently: Take 7.5-10 mg by mouth See admin instructions. Take 1.5 to 2 tablets by mouth once daily as directed by Anticoagulation clinic; 10 mg Sun Wed Fri, 7.5 mg all other days) 50 tablet 3   No  current facility-administered medications for this visit.     Family History  Problem Relation Age of Onset   Cancer Mother        peritoneal   Arrhythmia Father    Prostate cancer Father    Colon cancer Brother 9   Cancer Maternal Grandmother  type unknown   Heart attack Maternal Grandfather    Diabetes Paternal Grandmother    Heart attack Paternal Grandfather        Physical Exam: There were no vitals taken for this visit.     Diagnostic Studies & Laboratory data: I have personally reviewed the following studies and agree with the findings   TTE (10/2021) IMPRESSIONS   1. Left ventricular ejection fraction, by estimation, is 25 to 30%. The  left ventricle has severely decreased function. The left ventricle  demonstrates regional wall motion abnormalities with mid to apical  inferoseptal and anteroseptal akinesis,  akinesis of the apical anterior/inferior/lateral walls and the true apex.  There was chronic-appearing LV thrombus at the apex. There is mild left  ventricular hypertrophy. Left ventricular diastolic parameters are  consistent with Grade I diastolic  dysfunction (impaired relaxation).   2. Right ventricular systolic function is normal. The right ventricular  size is normal.   3. The mitral valve is normal in structure. Trivial mitral valve  regurgitation. No evidence of mitral stenosis.   4. The aortic valve is bicuspid. There is moderate calcification of the  aortic valve. Aortic valve regurgitation is not visualized. Severe low  flow low gradient aortic valve stenosis. Aortic valve area, by VTI  measures 0.88 cm. Aortic valve mean  gradient measures 27.0 mmHg.   5. Aortic dilatation noted. There is mild dilatation of the ascending  aorta, measuring 37 mm.   6. The inferior vena cava is normal in size with greater than 50%  respiratory variability, suggesting right atrial pressure of 3 mmHg.   FINDINGS   Left Ventricle: Left ventricular  ejection fraction, by estimation, is 25  to 30%. The left ventricle has severely decreased function. The left  ventricle demonstrates regional wall motion abnormalities. The left  ventricular internal cavity size was normal   in size. There is mild left ventricular hypertrophy. Left ventricular  diastolic parameters are consistent with Grade I diastolic dysfunction  (impaired relaxation).   Right Ventricle: The right ventricular size is normal. No increase in  right ventricular wall thickness. Right ventricular systolic function is  normal.   Left Atrium: Left atrial size was normal in size.   Right Atrium: Right atrial size was normal in size.   Pericardium: There is no evidence of pericardial effusion.   Mitral Valve: The mitral valve is normal in structure. Trivial mitral  valve regurgitation. No evidence of mitral valve stenosis.   Tricuspid Valve: The tricuspid valve is normal in structure. Tricuspid  valve regurgitation is not demonstrated.   Aortic Valve: The aortic valve is bicuspid. There is moderate  calcification of the aortic valve. Aortic valve regurgitation is not  visualized. Severe aortic stenosis is present. Aortic valve mean gradient  measures 27.0 mmHg. Aortic valve peak gradient  measures 44.1 mmHg. Aortic valve area, by VTI measures 0.88 cm.   Pulmonic Valve: The pulmonic valve was normal in structure. Pulmonic valve  regurgitation is not visualized.   Aorta: Aortic dilatation noted. There is mild dilatation of the ascending  aorta, measuring 37 mm.   Venous: The inferior vena cava is normal in size with greater than 50%  respiratory variability, suggesting right atrial pressure of 3 mmHg.   IAS/Shunts: No atrial level shunt detected by color flow Doppler.   Additional Comments: A device lead is visualized in the right ventricle.     LEFT VENTRICLE  PLAX 2D  LVIDd:         5.30 cm  Diastology  LVIDs:         3.50 cm   LV e' medial:    6.60 cm/s   LV PW:         0.90 cm   LV E/e' medial:  9.0  LV IVS:        0.90 cm   LV e' lateral:   5.18 cm/s  LVOT diam:     2.50 cm   LV E/e' lateral: 11.5  LV SV:         67  LV SV Index:   33  LVOT Area:     4.91 cm                             3D Volume EF:                           3D EF:        37 %                           LV EDV:       263 ml                           LV ESV:       165 ml                           LV SV:        98 ml   RIGHT VENTRICLE  RV S prime:     37.90 cm/s  TAPSE (M-mode): 1.6 cm   LEFT ATRIUM             Index  LA Vol (A2C):   48.7 ml 24.23 ml/m  LA Vol (A4C):   59.8 ml 29.76 ml/m  LA Biplane Vol: 59.8 ml 29.76 ml/m   AORTIC VALVE  AV Area (Vmax):    0.86 cm  AV Area (Vmean):   0.94 cm  AV Area (VTI):     0.88 cm  AV Vmax:           332.00 cm/s  AV Vmean:          237.400 cm/s  AV VTI:            0.755 m  AV Peak Grad:      44.1 mmHg  AV Mean Grad:      27.0 mmHg  LVOT Vmax:         58.20 cm/s  LVOT Vmean:        45.700 cm/s  LVOT VTI:          0.136 m  LVOT/AV VTI ratio: 0.18    AORTA  Ao Asc diam: 3.70 cm   MITRAL VALVE  MV Area (PHT): 4.99 cm    SHUNTS  MV Decel Time: 152 msec    Systemic VTI:  0.14 m  MV E velocity: 59.60 cm/s  Systemic Diam: 2.50 cm  MV A velocity: 84.20 cm/s  MV E/A ratio:  0.71   CATH (11/2021) Conclusion    Prox RCA to Mid RCA lesion is 50% stenosed.   Mid LAD lesion is 100% stenosed.   Chronic occlusion of the mid LAD. There is no clear collateral filling of the distal LAD. Patent large diagonal branch.  Patent large Circumflex and intermediate branch with no significant plaque Large dominant RCA with moderate mid stenosis.  Severe AS by echo. I did not cross the aortic valve today secondary to known chronic LV thrombus Normal right heart pressures.    Recent Radiology Findings:   CTA (11/2021) FINDINGS: Aortic Valve: Moderately thickened moderately calcific bicuspid aortic valve Siever's Type I with  calcified raphe at L-R Fusion. There is no significant tapering into the LVOT. Reduced excursion the planimeter valve area is 1.57 Sq cm consistent with moderate to severe aortic stenosis   Annular calcification: None   Aortic Valve Calcium Score: 1246   Presence of severe basal septal hypertrophy: No   Perimembranous septal diameter: 8 mm   Mitral Valve: No calcification   Aortic Annulus Measurements- 30%   Major annulus diameter: 30 mm   Minor annulus diameter: 25 mm   Annular perimeter: 87 mm   Annular area: 5.71 cm2   Aortic Root Measurements   Sinotubular Junction: 34 mm   Ascending Thoracic Aorta: 36 mm   Aortic Arch: 24 mm   Descending Thoracic Aorta: 24 mm   Sinus of Valsalva Measurements:   Right coronary cusp width: 33 mm   Left coronary cusp width: 32 mm   Non coronary cusp width: 34 mm   Coronary Artery Height above Annulus:   Left Main: 17 mm   Left SoV height: 24 mm   Right Coronary: 15 mm   Right SoV height: 23 mm   Optimum Fluoroscopic Angle for Delivery: LAO 2, CAU 1   Cusp overlay view angle: RAO 0, CAU 5   Valves for structural team consideration:   29 mm Sapien Valve, embedded geometry scene saved for this valve   Non TAVR Valve Findings:   Coronary Arteries: Normal coronary origin. Study not completed with nitroglycerin.   Coronary Calcium Score:   Left main: 0   Left anterior descending artery: 25   Left circumflex artery: 0   Right coronary artery: 38   Total: 39   Percentile: 71st for age, sex, and race matched control.   Systemic veins: Normal anatomy   Main Pulmonary artery: Mild dilation 30 mm   Pulmonary veins: Normal anatomy   Left atrial appendage: Patent   Interatrial septum: No communications   Left ventricle: Mild dilation. There is layered density with average HU of 55 consistent with a chronic, layered, LV apical thrombus.   Left atrium: Mild dilation   Right ventricle: Normal size    Right atrium: Mild dilation   Pericardium: No pericardial effusion   CIED: There is a single CIED leads present that course through superior vena cava and that terminates in the right ventricle. The CIED leads touch the wall of the SVC for >= 1 cm. There is a CIED lead the is embedded within the RV apex. The CIED lead does not terminate beyond the RV wall. No CIED lead thrombus or lead fracture was detected.   Extra Cardiac Findings as per separate reporting.   IMPRESSION: 1. Bicuspid Aortic stenosis. Findings pertinent to TAVR procedure are detailed above.   RECOMMENDATIONS:   The proposed cut-off value of 1,651 AU yielded a 93 % sensitivity and 75 % specificity in grading AS severity in patients with classical low-flow, low-gradient AS. Proposed different cut-off values to define severe AS for men and women as 2,065 AU and 1,274 AU, respectively. The joint European and American recommendations for the assessment of AS consider the aortic valve calcium score as a  continuum - a very high calcium score suggests severe AS and a low calcium score suggests severe AS is unlikely.    Recent Lab Findings: Lab Results  Component Value Date   WBC 8.7 12/02/2021   HGB 12.9 (L) 12/12/2021   HCT 38.0 (L) 12/12/2021   PLT 322 12/02/2021   GLUCOSE 91 12/02/2021   CHOL 126 02/18/2021   TRIG 99 02/18/2021   HDL 40 (L) 02/18/2021   LDLDIRECT 90.2 09/02/2019   LDLCALC 66 02/18/2021   ALT 33 12/13/2019   AST 23 12/13/2019   NA 139 12/12/2021   K 4.2 12/12/2021   CL 101 12/02/2021   CREATININE 1.22 12/02/2021   BUN 23 12/02/2021   CO2 24 12/02/2021   TSH 3.181 08/28/2019   INR 3.2 (A) 12/23/2021   HGBA1C 5.8 (H) 08/28/2019      Assessment / Plan:        I have spent 60 min in review of the records, viewing studies and in face to face with patient and in coordination of future care    Coralie Common 01/05/2022 12:53 PM

## 2022-01-06 ENCOUNTER — Encounter: Payer: Self-pay | Admitting: Thoracic Surgery (Cardiothoracic Vascular Surgery)

## 2022-01-06 ENCOUNTER — Ambulatory Visit: Payer: Medicaid Other | Attending: Cardiology

## 2022-01-06 ENCOUNTER — Other Ambulatory Visit: Payer: Self-pay | Admitting: *Deleted

## 2022-01-06 ENCOUNTER — Institutional Professional Consult (permissible substitution) (INDEPENDENT_AMBULATORY_CARE_PROVIDER_SITE_OTHER): Payer: Medicaid Other | Admitting: Thoracic Surgery (Cardiothoracic Vascular Surgery)

## 2022-01-06 ENCOUNTER — Other Ambulatory Visit: Payer: Self-pay

## 2022-01-06 VITALS — BP 94/62 | HR 88 | Resp 20 | Ht 69.0 in | Wt 183.0 lb

## 2022-01-06 DIAGNOSIS — I350 Nonrheumatic aortic (valve) stenosis: Secondary | ICD-10-CM

## 2022-01-06 DIAGNOSIS — I513 Intracardiac thrombosis, not elsewhere classified: Secondary | ICD-10-CM

## 2022-01-06 DIAGNOSIS — Z5181 Encounter for therapeutic drug level monitoring: Secondary | ICD-10-CM | POA: Diagnosis not present

## 2022-01-06 DIAGNOSIS — Z7901 Long term (current) use of anticoagulants: Secondary | ICD-10-CM

## 2022-01-06 LAB — POCT INR: INR: 3.4 — AB (ref 2.0–3.0)

## 2022-01-06 NOTE — Patient Instructions (Signed)
Continue taking warfarin 2 tablets daily except for 1.5 tablets on Tuesdays, Thursdays and Saturdays. Recheck INR 6 weeks. [facility]in Clinic [phone]  [place] [id number]

## 2022-01-06 NOTE — Patient Instructions (Signed)
Stop coumadin 5 days prior to surgery on January 24th Needs to be bridged with lovenox

## 2022-01-07 ENCOUNTER — Telehealth: Payer: Self-pay

## 2022-01-07 ENCOUNTER — Encounter: Payer: Self-pay | Admitting: *Deleted

## 2022-01-07 ENCOUNTER — Other Ambulatory Visit: Payer: Self-pay

## 2022-01-07 ENCOUNTER — Telehealth: Payer: Self-pay | Admitting: *Deleted

## 2022-01-07 NOTE — Telephone Encounter (Signed)
Lpmtcb and reschedule INR aapt to 1/15 for Lovenox Bridging.

## 2022-01-07 NOTE — Telephone Encounter (Signed)
   Pre-operative Risk Assessment    Patient Name: Craig Flores  DOB: 11/28/1965 MRN: [id number]      Request for Surgical Clearance    Procedure:   AORTIC VALVE REPLACEMENT   Date of Surgery:  Clearance 02/19/22                             Surgeon:  DR. Lavonna Monarch Surgeon's Group or Practice Name:  [facility] Phone number:  [phone] Fax number:  [phone] ATTN: Levonne Spiller, RN   Type of Clearance Requested:   - Medical  - Pharmacy:  Hold Warfarin (Coumadin) x 5 DAYS PRIOR AND THEN BRIDGED WITH LOVENOX    Type of Anesthesia:  General    Additional requests/questions:    Craig Flores   01/07/2022, 1:01 PM

## 2022-01-08 NOTE — Telephone Encounter (Signed)
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 LV thrombus noted on echo 08/2019, resolved 09/2019 echo. Recurred 08/2021 echo, still noted on 10/2021 echo but smaller and not mobile. INR range has been increased a few times, most recently to 3-3.5.  Procedure: AVR Date of procedure: 02/19/22  He does not have afib, there should not be a CHADS2VASc score calculated.  CrCl 72-39m/min using either adjusted or actual body weight, not 50 as noted previously Platelet count 322K  Per office protocol, patient can hold warfarin for 5 days prior to procedure. Patient will need bridging with Lovenox (enoxaparin) around procedure. This will be coordinated at [facility] where pt is followed.  **This guidance is not considered finalized until pre-operative APP has relayed final recommendations.**

## 2022-01-08 NOTE — Telephone Encounter (Signed)
Left message for the pt to call back to set up a tele pre op appt.

## 2022-01-08 NOTE — Telephone Encounter (Signed)
   Name: Jurgen Groeneveld  DOB: 05/02/1965  MRN: [id number]  Primary Cardiologist: Quay Burow, MD  Chart reviewed as part of pre-operative protocol coverage. Because of Arush Gatliff past medical history and time since last visit, he will require a follow-up phone visit in order to better assess preoperative cardiovascular risk. Please schedule for after 01/13/22.  Pre-op covering staff: - Please schedule appointment and call patient to inform them. If patient already had an upcoming appointment within acceptable timeframe, please add "pre-op clearance" to the appointment notes so provider is aware. - Please contact requesting surgeon's office via preferred method (i.e, phone, fax) to inform them of need for appointment prior to surgery.  This message will also be routed to pharmacy pool  for input on holding anticoagulation as requested below so that this information is available to the clearing provider at time of patient's appointment.   Loel Dubonnet, NP  01/08/2022, 12:21 PM

## 2022-01-08 NOTE — Telephone Encounter (Signed)
Patient on Warfarin for LV thrombus. Will route to pharmacy team for input.   Per Dr. Claris Gladden last note 11/20/21   "4. LV thrombus: Large LV thrombus noted on echo in 8/21, resolved on 9/21 echo.  Thrombus had recurred on 8/23 echo and warfarin was increased to INR goal 2.5-3.5 and ASA 81 was added.  On today's echo, there is still an LV thrombus though it is smaller than in 8/23.  It is not mobile.  - Continue warfarin, increase INR goal to 3-3.5 and continue ASA 81 daily. "   CHA2DS2-VASc Score = 3   This indicates a 3.2% annual risk of stroke. The patient's score is based upon: CHF History: 1 HTN History: 1 Diabetes History: 0 Stroke History: 0 Vascular Disease History: 1 Age Score: 0 Gender Score: 0     Platelet count: 12/02/2021: Platelets 322   Creatinine clearance: 55m/min (adjusted for weight)  12/02/2021: Creatinine, Ser 1.22 12/12/2021: Hemoglobin 12.9     CLoel Dubonnet NP  01/08/22  9:17 AM

## 2022-01-08 NOTE — Telephone Encounter (Signed)
Called pt to reschedule Coumadin Clinic appt, no answer. Left message on voicemail.

## 2022-01-09 ENCOUNTER — Telehealth: Payer: Self-pay

## 2022-01-09 ENCOUNTER — Other Ambulatory Visit: Payer: Self-pay

## 2022-01-09 NOTE — Telephone Encounter (Signed)
Pt scheduled for a tele visit on 01/30/22. Med rec and consent done

## 2022-01-09 NOTE — Telephone Encounter (Signed)
  Patient Consent for Virtual Visit         Craig Flores has provided verbal consent on 01/09/2022 for a virtual visit (video or telephone).   CONSENT FOR VIRTUAL VISIT FOR:  Craig Flores  By participating in this virtual visit I agree to the following:  I hereby voluntarily request, consent and authorize [facility] and its employed or contracted physicians, physician assistants, nurse practitioners or other licensed health care professionals (the Practitioner), to provide me with telemedicine health care services (the "Services") as deemed necessary by the treating Practitioner. I acknowledge and consent to receive the Services by the Practitioner via telemedicine. I understand that the telemedicine visit will involve communicating with the Practitioner through live audiovisual communication technology and the disclosure of certain medical information by electronic transmission. I acknowledge that I have been given the opportunity to request an in-person assessment or other available alternative prior to the telemedicine visit and am voluntarily participating in the telemedicine visit.  I understand that I have the right to withhold or withdraw my consent to the use of telemedicine in the course of my care at any time, without affecting my right to future care or treatment, and that the Practitioner or I may terminate the telemedicine visit at any time. I understand that I have the right to inspect all information obtained and/or recorded in the course of the telemedicine visit and may receive copies of available information for a reasonable fee.  I understand that some of the potential risks of receiving the Services via telemedicine include:  Delay or interruption in medical evaluation due to technological equipment failure or disruption; Information transmitted may not be sufficient (e.g. poor resolution of images) to allow for appropriate medical decision making by the  Practitioner; and/or  In rare instances, security protocols could fail, causing a breach of personal health information.  Furthermore, I acknowledge that it is my responsibility to provide information about my medical history, conditions and care that is complete and accurate to the best of my ability. I acknowledge that Practitioner's advice, recommendations, and/or decision may be based on factors not within their control, such as incomplete or inaccurate data provided by me or distortions of diagnostic images or specimens that may result from electronic transmissions. I understand that the practice of medicine is not an exact science and that Practitioner makes no warranties or guarantees regarding treatment outcomes. I acknowledge that a copy of this consent can be made available to me via my patient portal ([facility]), or I can request a printed copy by calling the office of [facility].    I understand that my insurance will be billed for this visit.   I have read or had this consent read to me. I understand the contents of this consent, which adequately explains the benefits and risks of the Services being provided via telemedicine.  I have been provided ample opportunity to ask questions regarding this consent and the Services and have had my questions answered to my satisfaction. I give my informed consent for the services to be provided through the use of telemedicine in my medical care

## 2022-01-09 NOTE — Telephone Encounter (Signed)
Spoke with pt and moved anticoagulation clinic appt to 02/10/22 to prepare for upcoming procedure and complete lovenox bridging. He verbalized understanding and noted new appt date and time.

## 2022-01-11 ENCOUNTER — Emergency Department (HOSPITAL_COMMUNITY)
Admission: EM | Admit: 2022-01-11 | Discharge: 2022-01-11 | Disposition: A | Discharge status: Home / Self Care | Payer: Medicaid Other | Attending: Emergency Medicine | Admitting: Emergency Medicine

## 2022-01-11 ENCOUNTER — Other Ambulatory Visit: Payer: Self-pay

## 2022-01-11 ENCOUNTER — Emergency Department (HOSPITAL_COMMUNITY): Payer: Medicaid Other

## 2022-01-11 DIAGNOSIS — F10929 Alcohol use, unspecified with intoxication, unspecified: Secondary | ICD-10-CM | POA: Diagnosis not present

## 2022-01-11 DIAGNOSIS — S0003XA Contusion of scalp, initial encounter: Secondary | ICD-10-CM | POA: Diagnosis not present

## 2022-01-11 DIAGNOSIS — Z7982 Long term (current) use of aspirin: Secondary | ICD-10-CM | POA: Diagnosis not present

## 2022-01-11 DIAGNOSIS — Z7901 Long term (current) use of anticoagulants: Secondary | ICD-10-CM | POA: Diagnosis not present

## 2022-01-11 DIAGNOSIS — R55 Syncope and collapse: Secondary | ICD-10-CM | POA: Diagnosis not present

## 2022-01-11 DIAGNOSIS — F10129 Alcohol abuse with intoxication, unspecified: Secondary | ICD-10-CM | POA: Diagnosis not present

## 2022-01-11 DIAGNOSIS — F10920 Alcohol use, unspecified with intoxication, uncomplicated: Secondary | ICD-10-CM | POA: Insufficient documentation

## 2022-01-11 LAB — CBC WITH DIFFERENTIAL/PLATELET
Abs Immature Granulocytes: 0.04 10*3/uL (ref 0.00–0.07)
Basophils Absolute: 0.1 10*3/uL (ref 0.0–0.1)
Basophils Relative: 1 %
Eosinophils Absolute: 0.1 10*3/uL (ref 0.0–0.5)
Eosinophils Relative: 1 %
HCT: 45.3 % (ref 39.0–52.0)
Hemoglobin: 15.6 g/dL (ref 13.0–17.0)
Immature Granulocytes: 0 %
Lymphocytes Relative: 18 %
Lymphs Abs: 1.9 10*3/uL (ref 0.7–4.0)
MCH: 31.7 pg (ref 26.0–34.0)
MCHC: 34.4 g/dL (ref 30.0–36.0)
MCV: 92.1 fL (ref 80.0–100.0)
Monocytes Absolute: 0.6 10*3/uL (ref 0.1–1.0)
Monocytes Relative: 5 %
Neutro Abs: 7.7 10*3/uL (ref 1.7–7.7)
Neutrophils Relative %: 75 %
Platelets: 203 10*3/uL (ref 150–400)
RBC: 4.92 MIL/uL (ref 4.22–5.81)
RDW: 14.3 % (ref 11.5–15.5)
WBC: 10.4 10*3/uL (ref 4.0–10.5)
nRBC: 0 % (ref 0.0–0.2)

## 2022-01-11 LAB — URINALYSIS, ROUTINE W REFLEX MICROSCOPIC
Bacteria, UA: NONE SEEN
Bilirubin Urine: NEGATIVE
Glucose, UA: >=500 mg/dL — AB
Hgb urine dipstick: NEGATIVE
Ketones, ur: NEGATIVE mg/dL
Leukocytes,Ua: NEGATIVE
Nitrite: NEGATIVE
Protein, ur: 100 mg/dL — AB
Specific Gravity, Urine: 1.023 (ref 1.005–1.030)
pH: 5 (ref 5.0–8.0)

## 2022-01-11 LAB — I-STAT CHEM 8, ED
BUN: 32 mg/dL — ABNORMAL HIGH (ref 6–20)
Calcium, Ion: 1.07 mmol/L — ABNORMAL LOW (ref 1.15–1.40)
Chloride: 110 mmol/L (ref 98–111)
Creatinine, Ser: 1.4 mg/dL — ABNORMAL HIGH (ref 0.61–1.24)
Glucose, Bld: 75 mg/dL (ref 70–99)
HCT: 42 % (ref 39.0–52.0)
Hemoglobin: 14.3 g/dL (ref 13.0–17.0)
Potassium: 5.3 mmol/L — ABNORMAL HIGH (ref 3.5–5.1)
Sodium: 140 mmol/L (ref 135–145)
TCO2: 20 mmol/L — ABNORMAL LOW (ref 22–32)

## 2022-01-11 LAB — RAPID URINE DRUG SCREEN, HOSP PERFORMED
Amphetamines: NOT DETECTED
Barbiturates: NOT DETECTED
Benzodiazepines: NOT DETECTED
Cocaine: NOT DETECTED
Opiates: NOT DETECTED
Tetrahydrocannabinol: NOT DETECTED

## 2022-01-11 LAB — TROPONIN I (HIGH SENSITIVITY)
Troponin I (High Sensitivity): 6 ng/L (ref <18)
Troponin I (High Sensitivity): 7 ng/L (ref <18)

## 2022-01-11 NOTE — ED Provider Notes (Addendum)
[facility] EMERGENCY DEPARTMENT Provider Note   CSN: [id number] Arrival date & time: 01/11/22  0138     History  Chief Complaint  Patient presents with   Loss of Consciousness   Alcohol Intoxication    Craig Flores is a 56 y.o. male.  The history is provided by the patient.  Loss of Consciousness Episode history:  Single Most recent episode:  Today Timing:  Constant Progression:  Resolved Chronicity:  New Context: not medication change, not with normal activity and not sight of blood   Relieved by:  Nothing Worsened by:  Nothing Ineffective treatments:  None tried Associated symptoms: no chest pain, no confusion, no diaphoresis, no difficulty breathing, no dizziness, no fever, no focal sensory loss, no headaches, no recent surgery, no rectal bleeding, no seizures, no shortness of breath, no visual change, no vomiting and no weakness   Associated symptoms comment:  Drank a fifth of liquor and then an entire box of wine Alcohol Intoxication Pertinent negatives include no chest pain, no headaches and no shortness of breath.       Home Medications Prior to Admission medications   Medication Sig Start Date End Date Taking? Authorizing Provider  acetaminophen (TYLENOL) 500 MG tablet Take 500 mg by mouth every 6 (six) hours as needed for headache.    [provider]  aspirin 81 MG chewable tablet Chew 1 tablet (81 mg total) by mouth daily. 09/25/21   [name], MD  atorvastatin (LIPITOR) 80 MG tablet Take 1 tablet (80 mg total) by mouth daily. 12/23/21   [name], MD  carvedilol (COREG) 12.5 MG tablet Take 1 tablet (12.5 mg total) by mouth 2 (two) times daily with a meal. 06/10/21   [place], Maricela Bo, FNP  dapagliflozin propanediol (FARXIGA) 10 MG TABS tablet Take 1 tablet (10 mg total) by mouth daily before breakfast. 12/30/21   [name], MD  digoxin (LANOXIN) 0.125 MG tablet Take 1/2 tablet (0.0625 mg total) by mouth daily.  02/18/21   [name], MD  enoxaparin (LOVENOX) 80 MG/0.8ML injection Inject 0.8 mLs (80 mg total) into the skin every 12 (twelve) hours. 12/05/21   [name], MD  ezetimibe (ZETIA) 10 MG tablet Take 1 tablet (10 mg total) by mouth daily. 12/16/21   [name], MD  fenofibrate (TRICOR) 145 MG tablet Take 1 tablet (145 mg total) by mouth daily. 10/07/21   [name], MD  furosemide (LASIX) 20 MG tablet Take 1 tablet (20 mg total) by mouth as needed. Patient taking differently: Take 20 mg by mouth daily as needed for edema. 11/20/21   [name], MD  hydrOXYzine (ATARAX) 25 MG tablet Take 1 tablet (25 mg total) by mouth 3 (three) times daily as needed. 10/28/21   Eulis Canner E, NP  melatonin 5 MG TABS Take 10 mg by mouth at bedtime.    [provider]  mirtazapine (REMERON) 45 MG tablet Take 1 tablet (45 mg total) by mouth at bedtime. 10/28/21   Salley Slaughter, NP  Multiple Vitamin (MULTIVITAMIN WITH MINERALS) TABS tablet Take 1 tablet by mouth daily. 05/02/14   Niel Hummer, NP  omega-3 acid ethyl esters (LOVAZA) 1 g capsule TAKE 2 CAPSULES (2 GRAMS TOTAL) BY MOUTH TWO TIMES DAILY. 11/08/21   [name], MD  omeprazole (PRILOSEC) 20 MG capsule Take 20 mg by mouth daily as needed (acid reflux).    [provider]  Probiotic Product (  PROBIOTIC DAILY PO) Take 1 capsule by mouth daily.    [provider]  sacubitril-valsartan (ENTRESTO) 97-103 MG Take 1 tablet by mouth 2 (two) times daily. 11/06/21   [name], MD  sertraline (ZOLOFT) 100 MG tablet Take 1.5 tablets (150 mg total) by mouth daily. 10/28/21   Salley Slaughter, NP  SILDENAFIL CITRATE PO Take 1-2 tablets by mouth daily as needed (ED). 30 mg tablet - take 1 - 2 tablets 30 to 45 min before sexual activity as needed 10/12/19   [provider]  spironolactone (ALDACTONE) 25 MG tablet Take 1 tablet (25 mg total) by mouth daily. 12/16/21   [name], MD   warfarin (COUMADIN) 5 MG tablet Take 1.5 to 2 tablets by mouth once daily as directed by Anticoagulation clinic Patient taking differently: Take 7.5-10 mg by mouth See admin instructions. Take 1.5 to 2 tablets by mouth once daily as directed by Anticoagulation clinic; 10 mg Sun Wed Fri, 7.5 mg all other days 10/07/21   [name], MD  MITIGARE 0.6 MG CAPS Take 0.6 mg by mouth daily. 12/05/19 04/04/20  [name], MD      Allergies    Patient has no known allergies.    Review of Systems   Review of Systems  Constitutional:  Negative for diaphoresis and fever.  HENT:  Negative for facial swelling.   Respiratory:  Negative for shortness of breath.   Cardiovascular:  Positive for syncope. Negative for chest pain.  Gastrointestinal:  Negative for vomiting.  Neurological:  Negative for dizziness, seizures, weakness and headaches.  Psychiatric/Behavioral:  Negative for confusion.   All other systems reviewed and are negative.   Physical Exam Updated Vital Signs BP 93/69   Pulse 62   Temp 97.8 F (36.6 C) (Oral)   Resp (!) 21   SpO2 96%  Physical Exam Vitals and nursing note reviewed. Exam conducted with a chaperone present.  Constitutional:      General: He is not in acute distress.    Appearance: Normal appearance. He is well-developed. He is not diaphoretic.  HENT:     Head: Normocephalic and atraumatic.     Nose: Nose normal.     Mouth/Throat:     Mouth: Mucous membranes are moist.  Eyes:     Conjunctiva/sclera: Conjunctivae normal.     Pupils: Pupils are equal, round, and reactive to light.  Cardiovascular:     Rate and Rhythm: Normal rate and regular rhythm.  Pulmonary:     Effort: Pulmonary effort is normal.     Breath sounds: Normal breath sounds. No wheezing or rales.  Abdominal:     General: Bowel sounds are normal.     Palpations: Abdomen is soft.     Tenderness: There is no abdominal tenderness. There is no guarding or rebound.  Musculoskeletal:         General: Normal range of motion.     Cervical back: Normal range of motion and neck supple.  Skin:    General: Skin is warm and dry.     Capillary Refill: Capillary refill takes less than 2 seconds.  Neurological:     General: No focal deficit present.     Mental Status: He is alert and oriented to person, place, and time.  Psychiatric:     Comments: Denied SI or HI, this was just to get intoxicated not to injure self      ED Results / Procedures / Treatments   Labs (  all labs ordered are listed, but only abnormal results are displayed) Results for orders placed or performed during the hospital encounter of 01/11/22  CBC with Differential  Result Value Ref Range   WBC 10.4 4.0 - 10.5 K/uL   RBC 4.92 4.22 - 5.81 MIL/uL   Hemoglobin 15.6 13.0 - 17.0 g/dL   HCT 45.3 39.0 - 52.0 %   MCV 92.1 80.0 - 100.0 fL   MCH 31.7 26.0 - 34.0 pg   MCHC 34.4 30.0 - 36.0 g/dL   RDW 14.3 11.5 - 15.5 %   Platelets 203 150 - 400 K/uL   nRBC 0.0 0.0 - 0.2 %   Neutrophils Relative % 75 %   Neutro Abs 7.7 1.7 - 7.7 K/uL   Lymphocytes Relative 18 %   Lymphs Abs 1.9 0.7 - 4.0 K/uL   Monocytes Relative 5 %   Monocytes Absolute 0.6 0.1 - 1.0 K/uL   Eosinophils Relative 1 %   Eosinophils Absolute 0.1 0.0 - 0.5 K/uL   Basophils Relative 1 %   Basophils Absolute 0.1 0.0 - 0.1 K/uL   Immature Granulocytes 0 %   Abs Immature Granulocytes 0.04 0.00 - 0.07 K/uL  Rapid urine drug screen (hospital performed)  Result Value Ref Range   Opiates NONE DETECTED NONE DETECTED   Cocaine NONE DETECTED NONE DETECTED   Benzodiazepines NONE DETECTED NONE DETECTED   Amphetamines NONE DETECTED NONE DETECTED   Tetrahydrocannabinol NONE DETECTED NONE DETECTED   Barbiturates NONE DETECTED NONE DETECTED  Urinalysis, Routine w reflex microscopic Urine, Clean Catch  Result Value Ref Range   Color, Urine YELLOW YELLOW   APPearance HAZY (A) CLEAR   Specific Gravity, Urine 1.023 1.005 - 1.030   pH 5.0 5.0 - 8.0   Glucose,  UA >=500 (A) NEGATIVE mg/dL   Hgb urine dipstick NEGATIVE NEGATIVE   Bilirubin Urine NEGATIVE NEGATIVE   Ketones, ur NEGATIVE NEGATIVE mg/dL   Protein, ur 100 (A) NEGATIVE mg/dL   Nitrite NEGATIVE NEGATIVE   Leukocytes,Ua NEGATIVE NEGATIVE   RBC / HPF 0-5 0 - 5 RBC/hpf   WBC, UA 0-5 0 - 5 WBC/hpf   Bacteria, UA NONE SEEN NONE SEEN   Squamous Epithelial / LPF 0-5 0 - 5   Mucus PRESENT    Hyaline Casts, UA PRESENT   I-stat chem 8, ED (not at [facility], [facility] or [facility])  Result Value Ref Range   Sodium 140 135 - 145 mmol/L   Potassium 5.3 (H) 3.5 - 5.1 mmol/L   Chloride 110 98 - 111 mmol/L   BUN 32 (H) 6 - 20 mg/dL   Creatinine, Ser 1.40 (H) 0.61 - 1.24 mg/dL   Glucose, Bld 75 70 - 99 mg/dL   Calcium, Ion 1.07 (L) 1.15 - 1.40 mmol/L   TCO2 20 (L) 22 - 32 mmol/L   Hemoglobin 14.3 13.0 - 17.0 g/dL   HCT 42.0 39.0 - 52.0 %  Troponin I (High Sensitivity)  Result Value Ref Range   Troponin I (High Sensitivity) 6 <18 ng/L   CT Head Wo Contrast  Result Date: 01/11/2022 CLINICAL DATA:  56 year old male with syncope and possible head trauma. EXAM: CT HEAD WITHOUT CONTRAST TECHNIQUE: Contiguous axial images were obtained from the base of the skull through the vertex without intravenous contrast. RADIATION DOSE REDUCTION: This exam was performed according to the departmental dose-optimization program which includes automated exposure control, adjustment of the mA and/or kV according to patient size and/or use of iterative reconstruction technique. COMPARISON:  None  Available. FINDINGS: Brain: Cerebral volume is within normal limits for age. No midline shift, ventriculomegaly, mass effect, evidence of mass lesion, intracranial hemorrhage or evidence of cortically based acute infarction. Patchy mild for age bilateral white matter hypodensity, including some involvement of the deep white matter capsules. But maintained gray-white differentiation throughout. Vascular: Calcified atherosclerosis at the skull base.  No suspicious intracranial vascular hyperdensity. Skull: Intact.  No fracture identified. Sinuses/Orbits: Visualized paranasal sinuses and mastoids are clear. Other: Posterior left superior scalp soft tissue swelling series 3, image 65 compatible with mild hematoma or contusion. No scalp soft tissue gas. Underlying calvarium appears intact. Visualized orbit soft tissues are within normal limits. IMPRESSION: 1. Posterior left scalp soft tissue injury without underlying skull fracture. 2. No acute intracranial abnormality. Mild for age nonspecific cerebral white matter changes, most commonly due to chronic small vessel disease. Electronically Signed   By: Genevie Ann M.D.   On: 01/11/2022 04:35   DG Chest Portable 1 View  Result Date: 01/11/2022 CLINICAL DATA:  Loss of consciousness.  Alcohol intoxication EXAM: PORTABLE CHEST 1 VIEW COMPARISON:  Radiographs 11/28/2019 FINDINGS: Stable cardiomegaly. Aortic atherosclerotic calcification. Left chest wall ICD. Bibasilar atelectasis or infiltrates. No pleural effusion or pneumothorax. IMPRESSION: Atelectasis or infiltrates in the lower lungs.  Cardiomegaly. Electronically Signed   By: Placido Sou M.D.   On: 01/11/2022 02:46   CT CORONARY MORPH W/CTA COR W/SCORE W/CA W/CM &/OR WO/CM  Addendum Date: 12/23/2021   ADDENDUM REPORT: 12/23/2021 09:52 ADDENDUM: Extracardiac findings were described under report for contemporaneously obtained CTA chest, abdomen and pelvis 12/20/2021. Please see that report for full details of relevant extracardiac findings. Electronically Signed   By: Vinnie Langton M.D.   On: 12/23/2021 09:52   Result Date: 12/23/2021 CLINICAL DATA:  Aortic Valve pathology with assessment for TAVR EXAM: Cardiac TAVR CT TECHNIQUE: The patient was scanned on a Siemens Force 664 slice scanner. A 120 kV retrospective scan was triggered in the descending thoracic aorta at 111 HU's. Gantry rotation speed was 270 msecs and collimation was .9 mm. No beta  blockade or nitro were given. The 3D data set was reconstructed in 5% intervals of the R-R cycle. Systolic and diastolic phases were analyzed on a dedicated work station using MPR, MIP and VRT modes. The patient received 100 cc of contrast. FINDINGS: Aortic Valve: Moderately thickened moderately calcific bicuspid aortic valve Siever's Type I with calcified raphe at L-R Fusion. There is no significant tapering into the LVOT. Reduced excursion the planimeter valve area is 1.57 Sq cm consistent with moderate to severe aortic stenosis Annular calcification: None Aortic Valve Calcium Score: 1246 Presence of severe basal septal hypertrophy: No Perimembranous septal diameter: 8 mm Mitral Valve: No calcification Aortic Annulus Measurements- 30% Major annulus diameter: 30 mm Minor annulus diameter: 25 mm Annular perimeter: 87 mm Annular area: 5.71 cm2 Aortic Root Measurements Sinotubular Junction: 34 mm Ascending Thoracic Aorta: 36 mm Aortic Arch: 24 mm Descending Thoracic Aorta: 24 mm Sinus of Valsalva Measurements: Right coronary cusp width: 33 mm Left coronary cusp width: 32 mm Non coronary cusp width: 34 mm Coronary Artery Height above Annulus: Left Main: 17 mm Left SoV height: 24 mm Right Coronary: 15 mm Right SoV height: 23 mm Optimum Fluoroscopic Angle for Delivery: LAO 2, CAU 1 Cusp overlay view angle: RAO 0, CAU 5 Valves for structural team consideration: 29 mm Sapien Valve, embedded geometry scene saved for this valve Non TAVR Valve Findings: Coronary Arteries: Normal coronary origin. Study not completed with  nitroglycerin. Coronary Calcium Score: Left main: 0 Left anterior descending artery: 25 Left circumflex artery: 0 Right coronary artery: 38 Total: 65 Percentile: 71st for age, sex, and race matched control. Systemic veins: Normal anatomy Main Pulmonary artery: Mild dilation 30 mm Pulmonary veins: Normal anatomy Left atrial appendage: Patent Interatrial septum: No communications Left ventricle: Mild dilation.  There is layered density with average HU of 55 consistent with a chronic, layered, LV apical thrombus. Left atrium: Mild dilation Right ventricle: Normal size Right atrium: Mild dilation Pericardium: No pericardial effusion CIED: There is a single CIED leads present that course through superior vena cava and that terminates in the right ventricle. The CIED leads touch the wall of the SVC for >= 1 cm. There is a CIED lead the is embedded within the RV apex. The CIED lead does not terminate beyond the RV wall. No CIED lead thrombus or lead fracture was detected. Extra Cardiac Findings as per separate reporting. IMPRESSION: 1. Bicuspid Aortic stenosis. Findings pertinent to TAVR procedure are detailed above. RECOMMENDATIONS: The proposed cut-off value of 1,651 AU yielded a 93 % sensitivity and 75 % specificity in grading AS severity in patients with classical low-flow, low-gradient AS. Proposed different cut-off values to define severe AS for men and women as 2,065 AU and 1,274 AU, respectively. The joint European and American recommendations for the assessment of AS consider the aortic valve calcium score as a continuum - a very high calcium score suggests severe AS and a low calcium score suggests severe AS is unlikely. Kerman Passey, et al. 2017 ESC/EACTS Guidelines for the management of valvular heart disease. Eur Heart J [phone] Coronary artery calcium (CAC) score is a strong predictor of incident coronary heart disease (CHD) and provides predictive information beyond traditional risk factors. CAC scoring is reasonable to use in the decision to withhold, postpone, or initiate statin therapy in intermediate-risk or selected borderline-risk asymptomatic adults (age 68-75 years and LDL-C >=70 to <190 mg/dL) who do not have diabetes or established atherosclerotic cardiovascular disease (ASCVD).* In intermediate-risk (10-year ASCVD risk >=7.5% to <20%) adults or selected borderline-risk (10-year  ASCVD risk >=5% to <7.5%) adults in whom a CAC score is measured for the purpose of making a treatment decision the following recommendations have been made: If CAC = 0, it is reasonable to withhold statin therapy and reassess in 5 to 10 years, as long as higher risk conditions are absent (diabetes mellitus, family history of premature CHD in first degree relatives (males <55 years; females <65 years), cigarette smoking, LDL >=190 mg/dL or other independent risk factors). If CAC is 1 to 99, it is reasonable to initiate statin therapy for patients >=30 years of age. If CAC is >=100 or >=75th percentile, it is reasonable to initiate statin therapy at any age. Cardiology referral should be considered for patients with CAC scores >=400 or >=75th percentile. *2018 AHA/ACC/AACVPR/AAPA/ABC/ACPM/ADA/AGS/APhA/ASPC/NLA/PCNA Guideline on the Management of Blood Cholesterol: A Report of the American College of Cardiology/American Heart Association Task Force on Clinical Practice Guidelines. J Am Coll Cardiol. 2019;73(24):3168-3209. Mahesh  Chandrasekhar Electronically Signed: By: Rudean Haskell M.D. On: 12/20/2021 18:06   CT ANGIO CHEST AORTA W/CM & OR WO/CM  Result Date: 12/23/2021 CLINICAL DATA:  56 year old male under preoperative evaluation prior to potential transcatheter aortic valve replacement (TAVR) procedure. EXAM: CT ANGIOGRAPHY CHEST, ABDOMEN AND PELVIS TECHNIQUE: Multidetector CT imaging through the chest, abdomen and pelvis was performed using the standard protocol during bolus administration of intravenous contrast. Multiplanar  reconstructed images and MIPs were obtained and reviewed to evaluate the vascular anatomy. RADIATION DOSE REDUCTION: This exam was performed according to the departmental dose-optimization program which includes automated exposure control, adjustment of the mA and/or kV according to patient size and/or use of iterative reconstruction technique. CONTRAST:  180m OMNIPAQUE  IOHEXOL 350 MG/ML SOLN COMPARISON:  None Available. FINDINGS: CTA CHEST FINDINGS Cardiovascular: Heart size is mildly enlarged with left ventricular dilatation. 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 There is no significant pericardial fluid, thickening or pericardial calcification. There is aortic atherosclerosis, as well as atherosclerosis of the great vessels of the mediastinum and the coronary arteries, including calcified atherosclerotic plaque in the left anterior descending and right coronary arteries. Thickening and calcification of the aortic valve. Left-sided pacemaker/AICD device in place with lead tip extending into the right ventricular apex. Mediastinum/Lymph Nodes: No pathologically enlarged mediastinal or hilar lymph nodes. Esophagus is unremarkable in appearance. No axillary lymphadenopathy. Lungs/Pleura: No acute consolidative airspace disease. No pleural effusions. No suspicious appearing pulmonary nodules or masses are noted. Musculoskeletal/Soft Tissues: There are no aggressive appearing lytic or blastic lesions noted in the visualized portions of the skeleton. CTA ABDOMEN AND PELVIS FINDINGS Hepatobiliary: No suspicious cystic or solid hepatic lesions. No intra or extrahepatic biliary ductal dilatation. Gallbladder is normal in appearance. Pancreas: No pancreatic mass. No pancreatic ductal dilatation. No pancreatic or peripancreatic fluid collections or inflammatory changes. Spleen: Unremarkable. Adrenals/Urinary Tract: Low-attenuation lesions in both kidneys, largest of which are compatible with simple cysts, measuring up to 2.7 cm in the upper pole of the left kidney. Other subcentimeter low-attenuation lesions in both kidneys are too small to definitively characterize, but favored to represent tiny cysts (no imaging follow-up recommended). No aggressive appearing  renal lesions. No hydroureteronephrosis. Urinary bladder is normal in appearance. Bilateral adrenal glands are normal in appearance. Stomach/Bowel: The appearance of the stomach is normal. There is no pathologic dilatation of small bowel or colon. Normal appendix. Vascular/Lymphatic: Aortic atherosclerosis, with vascular findings and measurements pertinent to potential TAVR procedure, as detailed below. No lymphadenopathy noted in the abdomen or pelvis. Reproductive: Prostate gland and seminal vesicles are unremarkable in appearance. Other: No significant volume of ascites.  No pneumoperitoneum. Musculoskeletal: There are no aggressive appearing lytic or blastic lesions noted in the visualized portions of the skeleton. VASCULAR MEASUREMENTS PERTINENT TO TAVR: AORTA: Minimal Aortic Diameter-14 x 14 mm Severity of Aortic Calcification-mild-to-moderate RIGHT PELVIS: Right Common Iliac Artery - Minimal Diameter-8.1 x 10.2 mm Tortuosity-mild Calcification-mild Right External Iliac Artery - Minimal Diameter-9.9 x 9.0 mm Tortuosity-mild Calcification-none Right Common Femoral Artery - Minimal Diameter-9.5 x 9.2 mm Tortuosity-mild Calcification-mild LEFT PELVIS: Left Common Iliac Artery - Minimal Diameter-7.5 x 8.8 mm Tortuosity-mild Calcification-mild-to-moderate Left External Iliac Artery - Minimal Diameter-8.1 x 8.4 mm Tortuosity-mild Calcification-none Left Common Femoral Artery - Minimal Diameter-9.4 x 7.0 mm Tortuosity-mild Calcification-mild Review of the MIP images confirms the above findings. IMPRESSION: 1. Vascular findings and measurements pertinent to potential TAVR procedure, as detailed above. 2. 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 This places the patient  at risk for systemic embolization. 3. Severe thickening and calcification of the aortic valve, compatible with reported clinical history of severe aortic  stenosis. 4. Aortic atherosclerosis, in addition to two-vessel coronary artery disease. Please note that although the presence of coronary artery calcium documents the presence of coronary artery disease, the severity of this disease and any potential stenosis cannot be assessed on this non-gated CT examination. Assessment for potential risk factor modification, dietary therapy or pharmacologic therapy may be warranted, if clinically indicated. 5. Additional incidental findings, as above. Electronically Signed   By: Vinnie Langton M.D.   On: 12/23/2021 09:51   CT ANGIO ABDOMEN PELVIS  W &/OR WO CONTRAST  Result Date: 12/23/2021 CLINICAL DATA:  56 year old male under preoperative evaluation prior to potential transcatheter aortic valve replacement (TAVR) procedure. EXAM: CT ANGIOGRAPHY CHEST, ABDOMEN AND PELVIS TECHNIQUE: Multidetector CT imaging through the chest, abdomen and pelvis was performed using the standard protocol during bolus administration of intravenous contrast. Multiplanar reconstructed images and MIPs were obtained and reviewed to evaluate the vascular anatomy. RADIATION DOSE REDUCTION: This exam was performed according to the departmental dose-optimization program which includes automated exposure control, adjustment of the mA and/or kV according to patient size and/or use of iterative reconstruction technique. CONTRAST:  137m OMNIPAQUE IOHEXOL 350 MG/ML SOLN COMPARISON:  None Available. FINDINGS: CTA CHEST FINDINGS Cardiovascular: Heart size is mildly enlarged with left ventricular dilatation. 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 There is no significant pericardial fluid, thickening or pericardial  calcification. There is aortic atherosclerosis, as well as atherosclerosis of the great vessels of the mediastinum and the coronary arteries, including calcified atherosclerotic plaque in the left anterior descending and right coronary arteries. Thickening and calcification of the aortic valve. Left-sided pacemaker/AICD device in place with lead tip extending into the right ventricular apex. Mediastinum/Lymph Nodes: No pathologically enlarged mediastinal or hilar lymph nodes. Esophagus is unremarkable in appearance. No axillary lymphadenopathy. Lungs/Pleura: No acute consolidative airspace disease. No pleural effusions. No suspicious appearing pulmonary nodules or masses are noted. Musculoskeletal/Soft Tissues: There are no aggressive appearing lytic or blastic lesions noted in the visualized portions of the skeleton. CTA ABDOMEN AND PELVIS FINDINGS Hepatobiliary: No suspicious cystic or solid hepatic lesions. No intra or extrahepatic biliary ductal dilatation. Gallbladder is normal in appearance. Pancreas: No pancreatic mass. No pancreatic ductal dilatation. No pancreatic or peripancreatic fluid collections or inflammatory changes. Spleen: Unremarkable. Adrenals/Urinary Tract: Low-attenuation lesions in both kidneys, largest of which are compatible with simple cysts, measuring up to 2.7 cm in the upper pole of the left kidney. Other subcentimeter low-attenuation lesions in both kidneys are too small to definitively characterize, but favored to represent tiny cysts (no imaging follow-up recommended). No aggressive appearing renal lesions. No hydroureteronephrosis. Urinary bladder is normal in appearance. Bilateral adrenal glands are normal in appearance. Stomach/Bowel: The appearance of the stomach is normal. There is no pathologic dilatation of small bowel or colon. Normal appendix. Vascular/Lymphatic: Aortic atherosclerosis, with vascular findings and measurements pertinent to potential TAVR procedure, as detailed  below. No lymphadenopathy noted in the abdomen or pelvis. Reproductive: Prostate gland and seminal vesicles are unremarkable in appearance. Other: No significant volume of ascites.  No pneumoperitoneum. Musculoskeletal: There are no aggressive appearing lytic or blastic lesions noted in the visualized portions of the skeleton. VASCULAR MEASUREMENTS PERTINENT TO TAVR: AORTA: Minimal Aortic Diameter-14 x 14 mm Severity of Aortic Calcification-mild-to-moderate RIGHT PELVIS: Right Common Iliac Artery - Minimal Diameter-8.1 x 10.2 mm Tortuosity-mild Calcification-mild Right External Iliac Artery - Minimal Diameter-9.9 x 9.0 mm Tortuosity-mild  Calcification-none Right Common Femoral Artery - Minimal Diameter-9.5 x 9.2 mm Tortuosity-mild Calcification-mild LEFT PELVIS: Left Common Iliac Artery - Minimal Diameter-7.5 x 8.8 mm Tortuosity-mild Calcification-mild-to-moderate Left External Iliac Artery - Minimal Diameter-8.1 x 8.4 mm Tortuosity-mild Calcification-none Left Common Femoral Artery - Minimal Diameter-9.4 x 7.0 mm Tortuosity-mild Calcification-mild Review of the MIP images confirms the above findings. IMPRESSION: 1. Vascular findings and measurements pertinent to potential TAVR procedure, as detailed above. 2. 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 This places the patient at risk for systemic embolization. 3. Severe thickening and calcification of the aortic valve, compatible with reported clinical history of severe aortic stenosis. 4. Aortic atherosclerosis, in addition to two-vessel coronary artery disease. Please note that although the presence of coronary artery calcium documents the presence of coronary artery disease, the severity of this disease and any  potential stenosis cannot be assessed on this non-gated CT examination. Assessment for potential risk factor modification, dietary therapy or pharmacologic therapy may be warranted, if clinically indicated. 5. Additional incidental findings, as above. Electronically Signed   By: Vinnie Langton M.D.   On: 12/23/2021 09:51   CARDIAC CATHETERIZATION  Result Date: 12/12/2021   Prox RCA to Mid RCA lesion is 50% stenosed.   Mid LAD lesion is 100% stenosed. Chronic occlusion of the mid LAD. There is no clear collateral filling of the distal LAD. Patent large diagonal branch. Patent large Circumflex and intermediate branch with no significant plaque Large dominant RCA with moderate mid stenosis. Severe AS by echo. I did not cross the aortic valve today secondary to known chronic LV thrombus Normal right heart pressures. Recommendations: Continue planning for AVR. I suspect that he will be better treated with surgical AVR given the LV thrombus.    EKG EKG Interpretation  Date/Time:  Saturday January 11 2022 01:50:22 EST Ventricular Rate:  67 PR Interval:  165 QRS Duration: 97 QT Interval:  400 QTC Calculation: 423 R Axis:   -84 Text Interpretation: Sinus rhythm Probable left atrial enlargement  Confirmed by Randal Buba, Marguriete Wootan ([id number]) on 01/11/2022 4:05:01 AM  Radiology CT Head Wo Contrast  Result Date: 01/11/2022 CLINICAL DATA:  56 year old male with syncope and possible head trauma. EXAM: CT HEAD WITHOUT CONTRAST TECHNIQUE: Contiguous axial images were obtained from the base of the skull through the vertex without intravenous contrast. RADIATION DOSE REDUCTION: This exam was performed according to the departmental dose-optimization program which includes automated exposure control, adjustment of the mA and/or kV according to patient size and/or use of iterative reconstruction technique. COMPARISON:  None Available. FINDINGS: Brain: Cerebral volume is within normal limits for age. No midline shift,  ventriculomegaly, mass effect, evidence of mass lesion, intracranial hemorrhage or evidence of cortically based acute infarction. Patchy mild for age bilateral white matter hypodensity, including some involvement of the deep white matter capsules. But maintained gray-white differentiation throughout. Vascular: Calcified atherosclerosis at the skull base. No suspicious intracranial vascular hyperdensity. Skull: Intact.  No fracture identified. Sinuses/Orbits: Visualized paranasal sinuses and mastoids are clear. Other: Posterior left superior scalp soft tissue swelling series 3, image 65 compatible with mild hematoma or contusion. No scalp soft tissue gas. Underlying calvarium appears intact. Visualized orbit soft tissues are within normal limits. IMPRESSION: 1. Posterior left scalp soft tissue injury without underlying skull fracture. 2. No acute intracranial abnormality.  Mild for age nonspecific cerebral white matter changes, most commonly due to chronic small vessel disease. Electronically Signed   By: Genevie Ann M.D.   On: 01/11/2022 04:35   DG Chest Portable 1 View  Result Date: 01/11/2022 CLINICAL DATA:  Loss of consciousness.  Alcohol intoxication EXAM: PORTABLE CHEST 1 VIEW COMPARISON:  Radiographs 11/28/2019 FINDINGS: Stable cardiomegaly. Aortic atherosclerotic calcification. Left chest wall ICD. Bibasilar atelectasis or infiltrates. No pleural effusion or pneumothorax. IMPRESSION: Atelectasis or infiltrates in the lower lungs.  Cardiomegaly. Electronically Signed   By: Placido Sou M.D.   On: 01/11/2022 02:46    Procedures Procedures    Medications Ordered in ED Medications - No data to display  ED Course/ Medical Decision Making/ A&P                           Medical Decision Making Patient had a syncopal event following drinking alcohol.    Amount and/or Complexity of Data Reviewed Independent Historian: EMS    Details: See above  External Data Reviewed: notes.    Details: Previous  notes reviewed  Labs: ordered.    Details: All labs reviewed:  negative UDS, normal white count 10.4, 15.6 normal hemoglobin, platelet count normal.  Urine negative for UTI.  Normal sodium 140, creatinine slightly elevated 1.4, negative troponin 6.   Radiology: ordered and independent interpretation performed.    Details: Negative CXR, negative chest xr  ECG/medicine tests: ordered and independent interpretation performed. Decision-making details documented in ED Course.  Risk Risk Details: Patient passed out secondary to large volume of ETOH intake without food intake.  Patient was not trying to hurt himself and denies SI.  Stable for discharge.  Clinically sober and is awake alert and PO challenged.  Strict return    Final Clinical Impression(s) / ED Diagnoses Final diagnoses:  None   Return for intractable cough, coughing up blood, fevers > 100.4 unrelieved by medication, shortness of breath, intractable vomiting, chest pain, shortness of breath, weakness, numbness, changes in speech, facial asymmetry, abdominal pain, passing out, Inability to tolerate liquids or food, cough, altered mental status or any concerns. No signs of systemic illness or infection. The patient is nontoxic-appearing on exam and vital signs are within normal limits.  I have reviewed the triage vital signs and the nursing notes. Pertinent labs & imaging results that were available during my care of the patient were reviewed by me and considered in my medical decision making (see chart for details). After history, exam, and medical workup I feel the patient has been appropriately medically screened and is safe for discharge home. Pertinent diagnoses were discussed with the patient. Patient was given return precautions.  Rx / DC Orders ED Discharge Orders     None         Alfredo Spong, MD 01/11/22 [id number]

## 2022-01-11 NOTE — ED Notes (Signed)
Patient transported to CT 

## 2022-01-11 NOTE — ED Triage Notes (Signed)
Patient was bib [facility] after he was found unconscious  in the floor by his son. When [facility] arrived on scene patient was A&Ox4 and hypotensive 80/50. EMS gave 562m bolus and BP returned to normal. Patient admits to heavily drinking today starting this morning and ended tonight around 9pm. He drank a 5th of whiskey and a box of wine. He admits that he was stressed and tried to numb the stress with alcohol. He states he has constant stress at home. Patient has a history of depression, heart attack, defibrillator placement.

## 2022-01-13 ENCOUNTER — Ambulatory Visit (HOSPITAL_COMMUNITY)
Admission: RE | Admit: 2022-01-13 | Discharge: 2022-01-13 | Disposition: A | Discharge status: Home / Self Care | Payer: Medicaid Other | Source: Ambulatory Visit | Attending: Family Medicine | Admitting: Family Medicine

## 2022-01-13 ENCOUNTER — Other Ambulatory Visit (HOSPITAL_COMMUNITY): Payer: Self-pay | Admitting: Family Medicine

## 2022-01-13 ENCOUNTER — Encounter (HOSPITAL_COMMUNITY): Payer: Self-pay

## 2022-01-13 ENCOUNTER — Other Ambulatory Visit: Payer: Self-pay

## 2022-01-13 VITALS — BP 98/68 | HR 74 | Wt 190.0 lb

## 2022-01-13 DIAGNOSIS — I252 Old myocardial infarction: Secondary | ICD-10-CM | POA: Diagnosis not present

## 2022-01-13 DIAGNOSIS — I350 Nonrheumatic aortic (valve) stenosis: Secondary | ICD-10-CM | POA: Insufficient documentation

## 2022-01-13 DIAGNOSIS — Z7901 Long term (current) use of anticoagulants: Secondary | ICD-10-CM | POA: Insufficient documentation

## 2022-01-13 DIAGNOSIS — F17210 Nicotine dependence, cigarettes, uncomplicated: Secondary | ICD-10-CM | POA: Insufficient documentation

## 2022-01-13 DIAGNOSIS — E785 Hyperlipidemia, unspecified: Secondary | ICD-10-CM | POA: Insufficient documentation

## 2022-01-13 DIAGNOSIS — Z79899 Other long term (current) drug therapy: Secondary | ICD-10-CM | POA: Diagnosis not present

## 2022-01-13 DIAGNOSIS — I2510 Atherosclerotic heart disease of native coronary artery without angina pectoris: Secondary | ICD-10-CM | POA: Diagnosis not present

## 2022-01-13 DIAGNOSIS — I255 Ischemic cardiomyopathy: Secondary | ICD-10-CM | POA: Diagnosis not present

## 2022-01-13 DIAGNOSIS — Z7982 Long term (current) use of aspirin: Secondary | ICD-10-CM | POA: Diagnosis not present

## 2022-01-13 DIAGNOSIS — E782 Mixed hyperlipidemia: Secondary | ICD-10-CM | POA: Diagnosis not present

## 2022-01-13 DIAGNOSIS — Z8249 Family history of ischemic heart disease and other diseases of the circulatory system: Secondary | ICD-10-CM | POA: Diagnosis not present

## 2022-01-13 DIAGNOSIS — I236 Thrombosis of atrium, auricular appendage, and ventricle as current complications following acute myocardial infarction: Secondary | ICD-10-CM | POA: Diagnosis not present

## 2022-01-13 DIAGNOSIS — Z720 Tobacco use: Secondary | ICD-10-CM

## 2022-01-13 DIAGNOSIS — I5022 Chronic systolic (congestive) heart failure: Secondary | ICD-10-CM | POA: Diagnosis not present

## 2022-01-13 LAB — LIPID PANEL
Cholesterol: 131 mg/dL (ref 0–200)
HDL: 36 mg/dL — ABNORMAL LOW (ref >40)
LDL Cholesterol: 36 mg/dL (ref 0–99)
Total CHOL/HDL Ratio: 3.6 RATIO
Triglycerides: 293 mg/dL — ABNORMAL HIGH (ref <150)
VLDL: 59 mg/dL — ABNORMAL HIGH (ref 0–40)

## 2022-01-13 LAB — BASIC METABOLIC PANEL
Anion gap: 7 (ref 5–15)
BUN: 20 mg/dL (ref 6–20)
CO2: 27 mmol/L (ref 22–32)
Calcium: 9.7 mg/dL (ref 8.9–10.3)
Chloride: 102 mmol/L (ref 98–111)
Creatinine, Ser: 1.13 mg/dL (ref 0.61–1.24)
GFR, Estimated: >60 mL/min (ref >60)
Glucose, Bld: 132 mg/dL — ABNORMAL HIGH (ref 70–99)
Potassium: 4.4 mmol/L (ref 3.5–5.1)
Sodium: 136 mmol/L (ref 135–145)

## 2022-01-13 LAB — DIGOXIN LEVEL: Digoxin Level: 0.5 ng/mL — ABNORMAL LOW (ref 0.8–2.0)

## 2022-01-13 MED ORDER — CARVEDILOL 12.5 MG PO TABS
12.5000 mg | ORAL_TABLET | Freq: Two times a day (BID) | ORAL | 6 refills | Status: DC
  Filled 2022-01-13: qty 60, 30d supply, fill #0
  Filled 2022-02-15 – 2022-02-18 (×2): qty 60, 30d supply, fill #1

## 2022-01-13 MED ORDER — VARENICLINE TARTRATE 0.5 MG PO TABS
ORAL_TABLET | ORAL | 3 refills | Status: DC
  Filled 2022-01-13: qty 131, 37d supply, fill #0
  Filled 2022-02-10: qty 11, 7d supply, fill #1
  Filled 2022-02-11: qty 120, 30d supply, fill #1

## 2022-01-13 NOTE — Patient Instructions (Addendum)
Thank you for coming in today  Labs were done today, if any labs are abnormal the clinic will call you No news is good news  Chantix starter pack  Take 1 tablet (0.5 mg total) by mouth daily for 3 days, THEN 1 tablet (0.5 mg total) 2 (two) times daily for 4 days, THEN 2 tablets (1 mg total) 2 (two) times daily.   Your physician recommends that you schedule a follow-up appointment in: 8 weeks with Dr. Aundra Dubin    Do the following things EVERYDAY: Weigh yourself in the morning before breakfast. Write it down and keep it in a log. Take your medicines as prescribed Eat low salt foods--Limit salt (sodium) to 2000 mg per day.  Stay as active as you can everyday Limit all fluids for the day to less than 2 liters  At the [facility], you and your health needs are our priority. As part of our continuing mission to provide you with exceptional heart care, we have created designated Provider Care Teams. These Care Teams include your primary Cardiologist (physician) and Advanced Practice Providers (APPs- Physician Assistants and Nurse Practitioners) who all work together to provide you with the care you need, when you need it.   You may see any of the following providers on your designated Care Team at your next follow up: Dr Glori Bickers Dr Loralie Champagne Dr. Roxana Hires, NP Lyda Jester, [place] [facility] [place], [place] Forestine Na, NP Audry Riles, PharmD   Please be sure to bring in all your medications bottles to every appointment.   If you have any questions or concerns before your next appointment please send [place] a message through [place] or call our office at [phone].    TO LEAVE A MESSAGE FOR THE NURSE SELECT OPTION 2, PLEASE LEAVE A MESSAGE INCLUDING: YOUR NAME DATE OF BIRTH CALL BACK NUMBER REASON FOR CALL**this is important as we prioritize the call backs  YOU WILL RECEIVE A CALL BACK THE SAME DAY AS LONG AS YOU CALL BEFORE 4:00  PM

## 2022-01-13 NOTE — Progress Notes (Signed)
Advanced Heart Failure Clinic Note   PCP: Pcp, [name] Cardiology: Dr. Aundra Dubin  Reason for Visit: Heart Failure    HPI: 56 y.o. male smoker was admitted on 08/28/19 for delayed presentation anterior STEMI. Had had >36 hr of chest pain prior to seeking medical attention. Pain progressed to more pleuritic like CP. Found to have anterior ST elevations w/ precordial Q waves on admit. Hs troponin >27,000. Urgent cardiac cath showed totally occluded mid LAD w/o collaterals and 40% mid RCA stenosis. It was suspected he had completed his infarct and residual pleuritic CP was post-MI pericarditis. No intervention was performed. 2D echo demonstrated moderately reduced LVEF, 45-50%, + apical thrombus and moderate AS. MV normal. RV systolic function mildly reduced.    He was placed on medical management for CAD and systolic HF. He was started on ASA, atorvastatin, Losartan and Coreg. Coumadin started for apical thrombus w/ heparin bridge.   Post cath, there were initial concerns for developing cardiogenic shock but he remained stable and did not require pressor/inotropic support. Co-ox remained stable. Repeat echo showed further reduction of LVEF down to 30-35% but no post MI mechanical complications. RV mildly reduced. He did require IV Lasix for pulmonary edema and volume status and dyspnea improved w/ diuresis. He was continued on GTDMT w/ losartan, spironolactone and Coreg. BP too soft for Entresto. He was treated w/ colchicine for post MI pericarditis w/ improvement in pleuritic chest pain. Given anterior MI w/ EF <35%, he was fitted w/ a LifeVest prior to discharge w/ plans to get repeat echo in 1 month.   He had repeat echo in 9/21, showing EF 25-30%.  He had [place] placed in 11/21.  Echo in 8/22 showed EF 30% with no LV thrombus, normal RV, normal IVC, moderate AS with mean gradient 28 mmHg and AVA 1.08 cm^2. Echo in 12/22 showed EF 30-35%, no LV thrombus, possibly bicuspid aortic valve with  mean gradient 34 mmHg and AVA 1.02 cm^2, normal RV, IVC normal.   Echo was done in 8/23 showing LV thrombus.  INR goal on warfarin was increased to 2.5-3.5.  Echo 10/23 showed EF remains 25-30% with peri-apical akinesis and a chronic-appearing LV thrombus, severe low flow/low gradient aortic stenosis (mean gradient 27 mmHg with AVA 0.88 cm^2), bicuspid aortic valve, normal RV, normal IVC. INR increased to 3-3.5.  We have discussed barostimulation activation therapy, but Medicaid does not have good coverage for this.   Follow up 10/23, continued with NYHA II-III symptoms. Referred to structural heart team for TAVR work up, underwent L/RHC showing chronic occlusion of mLAD without target and moderate RCA stenosis, severe AS and normal right heart pressures. Decided SAVR was best option with LV thrombus.  Seen in ED 01/11/22 with acute ETOH intoxication and LOC. BP low, given IVF with improvement. Discharged home.  Today he returns for HF follow up. Overall feeling fine. Continues with on-going dizziness when changing positions. He has dyspnea with moving fast, does OK walking on flat ground if he takes his time. Walks on the TM at the gym and can walk x 1 hour. Denies palpitations, abnormal bleeding, CP, edema, or PND/Orthopnea. Appetite ok. No fever or chills. Weight at home 182-183 pounds. Taking all medications. Has not needed to use Lasix. Smoking about 1 ppd, drinking ETOH every couple of weeks.  Device interrogation (personally reviewed): HL Score 1, 1.1 hr/day activity, average HR 70 bpm  Labs (9/21): K 4.3, creatinine 0.93 Labs (11/21): LDL 67, HDL 37, K 4.4,  creatinine 0.81 Labs (1/22): digoxin 0.5, K 4, creatinine 1.1.  Labs (5/22): digoxin 0.9, K 4.4, creatinine 1.06 Labs (8/22): K 4.3, creatinine 0.91, TGs 343, LDL 65, HDL 31, digoxin 1.0 Labs (12/22): K 4.1, creatinine 1.14, pro-BNP 120 Labs (1/23): hgb 14.7, LDL 66, TGs 99 Labs (5/23): digoxin 0.3, K 4.3, creatinine 1.17 Labs  (8/23): digoxin 0.2, K 4.5, creatinine 1.17 Labs (12/23): K 5.3, creatinine 1.07, hgb 15.6  ECG (personally reviewed): none ordered today.  PMH: 1. GERD 2. Depression  3. CAD: Anterior STEMI in 8/21 with totally occluded LAD/no collaterals, 40% mid RCA.  Due to late presentation, no PCI was done.  - Had post-infarct pericarditis.  4. Chronic systolic CHF: Ischemic cardiomyopathy. [place].  - Echo (9/21) with EF 25-30%, normal RV, moderate AS with mean gradient 22 mmHg.  - Echo (8/22): EF 30% with no LV thrombus, normal RV, normal IVC, moderate AS with mean gradient 28 mmHg and AVA 1.08 cm^2.  - Echo (12/22): EF 30-35%, no LV thrombus, possibly bicuspid aortic valve with mean gradient 34 mmHg and AVA 1.02 cm^2, normal RV, IVC normal.  - Echo (10/23): EF remains 25-30% with peri-apical akinesis and a chronic-appearing LV thrombus, severe low flow/low gradient aortic stenosis (mean gradient 27 mmHg with AVA 0.88 cm^2), bicuspid aortic valve, normal RV, normal IVC. - R/LHC (11/23): mLAD 100% stenosed, pRCA 50% stenosed; RA mean 3, PA 25/14 mean 18, PCWP 13, CO/CI (Fick) 5.03/2.54 5. LV thrombus: Apical thrombus noted on echo at time of MI in 8/21. Resolved by 9/21 echo.  - Echo 10/23 with chronic-appearing, immobile LV thrombus.  6. Aortic stenosis: Bicuspid valve. Moderate on 12/22 echo.  - 10/23 echo showed low flow/low gradient severe AS.   7. Active smoker.   Current Outpatient Medications  Medication Sig Dispense Refill   acetaminophen (TYLENOL) 500 MG tablet Take 500 mg by mouth every 6 (six) hours as needed for headache.     aspirin 81 MG chewable tablet Chew 1 tablet (81 mg total) by mouth daily. 90 tablet 3   atorvastatin (LIPITOR) 80 MG tablet Take 1 tablet (80 mg total) by mouth daily. 30 tablet 11   carvedilol (COREG) 12.5 MG tablet Take 1 tablet (12.5 mg total) by mouth 2 (two) times daily with a meal. 60 tablet 6   dapagliflozin propanediol (FARXIGA) 10 MG TABS  tablet Take 1 tablet (10 mg total) by mouth daily before breakfast. 30 tablet 11   digoxin (LANOXIN) 0.125 MG tablet Take 1/2 tablet (0.0625 mg total) by mouth daily. 30 tablet 6   ezetimibe (ZETIA) 10 MG tablet Take 1 tablet (10 mg total) by mouth daily. 90 tablet 0   fenofibrate (TRICOR) 145 MG tablet Take 1 tablet (145 mg total) by mouth daily. 90 tablet 3   furosemide (LASIX) 20 MG tablet Take 1 tablet (20 mg total) by mouth as needed. (Patient taking differently: Take 20 mg by mouth daily as needed for edema.) 45 tablet 3   hydrOXYzine (ATARAX) 25 MG tablet Take 1 tablet (25 mg total) by mouth 3 (three) times daily as needed. 90 tablet 3   melatonin 5 MG TABS Take 10 mg by mouth at bedtime.     mirtazapine (REMERON) 45 MG tablet Take 1 tablet (45 mg total) by mouth at bedtime. 30 tablet 3   Multiple Vitamin (MULTIVITAMIN WITH MINERALS) TABS tablet Take 1 tablet by mouth daily.     omega-3 acid ethyl esters (LOVAZA) 1 g capsule TAKE 2 CAPSULES (  2 GRAMS TOTAL) BY MOUTH TWO TIMES DAILY. 120 capsule 3   omeprazole (PRILOSEC) 20 MG capsule Take 20 mg by mouth daily as needed (acid reflux).     Probiotic Product (PROBIOTIC DAILY PO) Take 1 capsule by mouth daily.     sacubitril-valsartan (ENTRESTO) 97-103 MG Take 1 tablet by mouth 2 (two) times daily. 60 tablet 11   sertraline (ZOLOFT) 100 MG tablet Take 1.5 tablets (150 mg total) by mouth daily. 45 tablet 3   SILDENAFIL CITRATE PO Take 1-2 tablets by mouth daily as needed (ED). 30 mg tablet - take 1 - 2 tablets 30 to 45 min before sexual activity as needed     spironolactone (ALDACTONE) 25 MG tablet Take 1 tablet (25 mg total) by mouth daily. 30 tablet 11   warfarin (COUMADIN) 5 MG tablet Take 1.5 to 2 tablets by mouth once daily as directed by Anticoagulation clinic (Patient taking differently: Take 7.5-10 mg by mouth See admin instructions. Take 1.5 to 2 tablets by mouth once daily as directed by Anticoagulation clinic; 10 mg Sun Wed Fri, 7.5 mg  all other days) 50 tablet 3   No current facility-administered medications for this encounter.   No Known Allergies  Social History   Socioeconomic History   Marital status: Married    Spouse name: Not on file   Number of children: 0   Years of education: Not on file   Highest education level: Not on file  Occupational History   Occupation: Probation officer   Occupation: Attorney/Writer  Tobacco Use   Smoking status: Every Day    Packs/day: 0.50    Types: Cigarettes   Smokeless tobacco: Never   Tobacco comments:    x15 yrs as of 2021  Vaping Use   Vaping Use: Never used  Substance and Sexual Activity   Alcohol use: Yes    Comment: a couple of drinks on the weekends   Drug use: Never   Sexual activity: Yes    Birth control/protection: None  Other Topics Concern   Not on file  Social History Narrative   2 adopted children   Social Determinants of Health   Financial Resource Strain: Not on file  Food Insecurity: Not on file  Transportation Needs: Not on file  Physical Activity: Not on file  Stress: Not on file  Social Connections: Not on file  Intimate Partner Violence: Not on file   Family History  Problem Relation Age of Onset   Cancer Mother        peritoneal   Arrhythmia Father    Prostate cancer Father    Colon cancer Brother 7   Cancer Maternal Grandmother        type unknown   Heart attack Maternal Grandfather    Diabetes Paternal Grandmother    Heart attack Paternal Grandfather    BP 98/68   Pulse 74   Wt 86.2 kg (190 lb)   SpO2 99%   BMI 28.06 kg/m   Wt Readings from Last 3 Encounters:  01/13/22 86.2 kg (190 lb)  01/06/22 83 kg (183 lb)  12/12/21 81.6 kg (180 lb)   PHYSICAL EXAM: General:  NAD. No resp difficulty HEENT: Normal Neck: Supple. No JVD. Carotids 2+ bilat; no bruits. No lymphadenopathy or thryomegaly appreciated. Cor: PMI nondisplaced. Regular rate & rhythm. No rubs, gallops, 2/6 SEM RUSB, faint S2 Lungs: Clear Abdomen: Soft,  nontender, nondistended. No hepatosplenomegaly. No bruits or masses. Good bowel sounds. Extremities: No cyanosis, clubbing, rash, edema Neuro: Alert &  oriented x 3, cranial nerves grossly intact. Moves all 4 extremities w/o difficulty. Affect pleasant.  ASSESSMENT & PLAN: 1. CAD: Admission 8/21 for acute anterior MI, delayed presentation with no ischemic pain, had pleuritic-type chest pain (post-MI pericarditis). LHC with totally occluded mid LAD w/o collaterals.  Suspected completed MI, no intervention. No ischemic chest pain.  - He is on ASA 81 daily, continue.  - Continue atorvastatin 80 mg daily. Check lipids today.  2. Chronic systolic CHF: Ischemic Cardiomyopathy.  Reduced EF post anterior MI.  Echo in 9/21 with EF 25-30%, peri-apical akinesis, moderate AS. Cath w/ occluded mLAD but no intervention as he had completed his infarct. He has [place].  Echo in 8/22 with EF 30%.  Echo in 12/22 with EF 30-35%, normal RV, moderate AS. Echo 10/23 showed EF remains 25-30% with peri-apical akinesis and a chronic-appearing LV thrombus, severe low flow/low gradient aortic stenosis (mean gradient 27 mmHg with AVA 0.88 cm^2), bicuspid aortic valve, normal RV, normal IVC.  NYHA class II-III symptoms, not volume overloaded by exam, or device interrogation. Suspect symptoms could be related to low flow/low gradient severe AS.    - Continue Entresto 97/103 bid.  Discussed decreasing to 49/51 in attempts to help orthostatics, he declines today. - Continue spironolactone 25 mg daily. BMET today.  - Continue Coreg 12.5 mg bid, orthostatic with attempts to increase.   - Continue dapagliflozin 10 mg daily.  - Continue digoxin 0.0625, check level today.   - Continue Lasix 20 mg PRN. - High carotid bifurcation so cannot get Batwire, unable to get commercial baroreceptor activation therapy as Medicaid will not cover.  3. Post MI Pericarditis: Resolved.  4. LV thrombus: Large LV thrombus noted on echo in  8/21, resolved on 9/21 echo.  Thrombus had recurred on 8/23 echo and warfarin was increased to INR goal 2.5-3.5 and ASA 81 was added.  Echo 10/23, there was still an LV thrombus, though it was smaller than in 8/23.  It is not mobile.  - Continue warfarin, INR goal to 3-3.5 (recently increased) and continue ASA 81 daily.  5. Tobacco use: He has gone back to smoking. Encouraged cessation.  - Restart Chantix.    6. HLD: Check lipids today.  7. Aortic stenosis: Bicuspid aortic valve with moderate AS on 12/22 echo. Most recent echo shows severe low flow/low gradient aortic stenosis with mean gradient 27 mmHg and AVA 0.88 cm^2.  This may be contributing to his symptoms given lack of volume overload on exam.  - Saw Structural heart team for TAVR evaluation, felt better surgical candidate due to his LV thrombus. Planning mechanical AV with young age and need for chronic coumadin. - Planning SAVR 02/19/22/ with Dr. Lavonna Monarch.  - He will require Lovenox bridge while holding Coumadin for his surgery.  Follow up after SAVR.  [place], FNP  01/13/2022

## 2022-01-14 ENCOUNTER — Telehealth (HOSPITAL_COMMUNITY): Payer: Self-pay

## 2022-01-14 ENCOUNTER — Encounter: Payer: Self-pay | Admitting: *Deleted

## 2022-01-14 ENCOUNTER — Other Ambulatory Visit: Payer: Self-pay

## 2022-01-14 NOTE — Telephone Encounter (Signed)
Patient aware and confirmed he is taking the [place].

## 2022-01-21 ENCOUNTER — Other Ambulatory Visit: Payer: Self-pay

## 2022-01-28 ENCOUNTER — Other Ambulatory Visit: Payer: Self-pay | Admitting: Cardiovascular Disease

## 2022-01-28 DIAGNOSIS — I513 Intracardiac thrombosis, not elsewhere classified: Secondary | ICD-10-CM

## 2022-01-29 ENCOUNTER — Other Ambulatory Visit: Payer: Self-pay

## 2022-01-29 MED ORDER — WARFARIN SODIUM 5 MG PO TABS
ORAL_TABLET | ORAL | 3 refills | Status: DC
  Filled 2022-01-29: qty 55, 27d supply, fill #0

## 2022-01-29 NOTE — Telephone Encounter (Signed)
Prescription refill request received for warfarin Lov: 01/14/22 Craig Flores)  Next INR check: 02/17/22 Warfarin tablet strength: '5mg'$   Appropriate dose and refill sent to requested pharmacy.

## 2022-01-30 ENCOUNTER — Ambulatory Visit: Payer: Medicaid Other | Attending: Cardiology | Admitting: Nurse Practitioner

## 2022-01-30 ENCOUNTER — Other Ambulatory Visit: Payer: Self-pay

## 2022-01-30 DIAGNOSIS — Z01810 Encounter for preprocedural cardiovascular examination: Secondary | ICD-10-CM | POA: Diagnosis not present

## 2022-01-30 NOTE — Progress Notes (Signed)
Virtual Visit via Telephone Note   Because of Craig Flores's co-morbid illnesses, he is at least at moderate risk for complications without adequate follow up.  This format is felt to be most appropriate for this patient at this time.  The patient did not have access to video technology/had technical difficulties with video requiring transitioning to audio format only (telephone).  All issues noted in this document were discussed and addressed.  No physical exam could be performed with this format.  Please refer to the patient's chart for his consent to telehealth for [facility].  Evaluation Performed:  Preoperative cardiovascular risk assessment _____________   Date:  01/30/2022   Patient ID:  Craig Flores, DOB [birth year], MRN [id number] Patient Location:  Home Provider location:   Office  Primary Care Provider:  Pcp, No Primary Cardiologist:  Quay Burow, MD  Chief Complaint / Patient Profile   57 y.o. y/o male with a h/o hyperlipidemia, CAD, chronic systolic CHF, ischemic cardiomyopathy, LV thrombus, tobacco abuse and severe aortic stenosis.  who is pending aortic valve replacement and presents today for telephonic preoperative cardiovascular risk assessment.  History of Present Illness    Craig Flores is a 57 y.o. male who presents via audio/video conferencing for a telehealth visit today.  Pt was last seen in cardiology clinic on 01/13/2022 by Allena Katz, FNP  was doing well following discharge for alcohol intoxication on 01/11/2022. The patient is now pending procedure as outlined above. Since his last visit, he reports that he has not had any cardiac complaints or increased swelling in his lower extremities. He also denied angina, orthopnea, or palpitations. He also reports that he has not had any alcohol since his ED admission on 01/11/2022. He does endorse SOB and dizziness that has been ongoing and related to his valve disease.   Past Medical  History    Past Medical History:  Diagnosis Date   Acid reflux    Anxiety    Aortic stenosis with bicuspid valve    CHF (congestive heart failure) (HCC)    Depression    GERD (gastroesophageal reflux disease)    HLD (hyperlipidemia)    Ischemic cardiomyopathy    Migraines    Myocardial infarction [facility])    Past Surgical History:  Procedure Laterality Date   COLONOSCOPY WITH PROPOFOL N/A 07/16/2021   Procedure: COLONOSCOPY WITH PROPOFOL;  Surgeon: Gatha Mayer, MD;  Location: [facility];  Service: Gastroenterology;  Laterality: N/A;   CORONARY/GRAFT ACUTE MI REVASCULARIZATION N/A 08/28/2019   Procedure: Coronary/Graft Acute MI Revascularization;  Surgeon: [name], MD;  Location: [facility];  Service: Cardiovascular;  Laterality: N/A;   ICD IMPLANT N/A 11/28/2019   Procedure: ICD IMPLANT;  Surgeon: Deboraha Sprang, MD;  Location: [facility];  Service: Cardiovascular;  Laterality: N/A;   LEFT HEART CATH AND CORONARY ANGIOGRAPHY N/A 08/28/2019   Procedure: LEFT HEART CATH AND CORONARY ANGIOGRAPHY;  Surgeon: [name], MD;  Location: [place] [facility];  Service: Cardiovascular;  Laterality: N/A;   POLYPECTOMY  07/16/2021   Procedure: POLYPECTOMY;  Surgeon: Gatha Mayer, MD;  Location: [facility];  Service: Gastroenterology;;   RIGHT HEART CATH AND CORONARY ANGIOGRAPHY N/A 12/12/2021   Procedure: RIGHT HEART CATH AND CORONARY ANGIOGRAPHY;  Surgeon: Burnell Blanks, MD;  Location: [facility];  Service: Cardiovascular;  Laterality: N/A;   WISDOM TOOTH EXTRACTION      Allergies  No Known Allergies  Home Medications  Prior to Admission medications   Medication Sig Start Date End Date Taking? Authorizing Provider  acetaminophen (TYLENOL) 500 MG tablet Take 500 mg by mouth every 6 (six) hours as needed for headache.    [provider]  aspirin 81 MG chewable tablet Chew 1 tablet (81 mg total) by mouth daily. 09/25/21    [name], MD  atorvastatin (LIPITOR) 80 MG tablet Take 1 tablet (80 mg total) by mouth daily. 12/23/21   [name], MD  carvedilol (COREG) 12.5 MG tablet Take 1 tablet (12.5 mg total) by mouth 2 (two) times daily with a meal. 01/13/22   [place], Maricela Bo, FNP  dapagliflozin propanediol (FARXIGA) 10 MG TABS tablet Take 1 tablet (10 mg total) by mouth daily before breakfast. 12/30/21   [name], MD  digoxin (LANOXIN) 0.125 MG tablet Take 1/2 tablet (0.0625 mg total) by mouth daily. 02/18/21   [name], MD  ezetimibe (ZETIA) 10 MG tablet Take 1 tablet (10 mg total) by mouth daily. 12/16/21   [name], MD  fenofibrate (TRICOR) 145 MG tablet Take 1 tablet (145 mg total) by mouth daily. 10/07/21   [name], MD  furosemide (LASIX) 20 MG tablet Take 1 tablet (20 mg total) by mouth as needed. Patient taking differently: Take 20 mg by mouth daily as needed for edema. 11/20/21   [name], MD  hydrOXYzine (ATARAX) 25 MG tablet Take 1 tablet (25 mg total) by mouth 3 (three) times daily as needed. 10/28/21   Eulis Canner E, NP  melatonin 5 MG TABS Take 10 mg by mouth at bedtime.    [provider]  mirtazapine (REMERON) 45 MG tablet Take 1 tablet (45 mg total) by mouth at bedtime. 10/28/21   Salley Slaughter, NP  Multiple Vitamin (MULTIVITAMIN WITH MINERALS) TABS tablet Take 1 tablet by mouth daily. 05/02/14   Niel Hummer, NP  omega-3 acid ethyl esters (LOVAZA) 1 g capsule TAKE 2 CAPSULES (2 GRAMS TOTAL) BY MOUTH TWO TIMES DAILY. 11/08/21   [name], MD  omeprazole (PRILOSEC) 20 MG capsule Take 20 mg by mouth daily as needed (acid reflux).    [provider]  Probiotic Product (PROBIOTIC DAILY PO) Take 1 capsule by mouth daily.    [provider]  sacubitril-valsartan (ENTRESTO) 97-103 MG Take 1 tablet by mouth 2 (two) times daily. 11/06/21   [name], MD  sertraline (ZOLOFT) 100 MG tablet Take 1.5 tablets (150  mg total) by mouth daily. 10/28/21   Salley Slaughter, NP  SILDENAFIL CITRATE PO Take 1-2 tablets by mouth daily as needed (ED). 30 mg tablet - take 1 - 2 tablets 30 to 45 min before sexual activity as needed 10/12/19   [provider]  spironolactone (ALDACTONE) 25 MG tablet Take 1 tablet (25 mg total) by mouth daily. 12/16/21   [name], MD  varenicline (CHANTIX) 0.5 MG tablet Take 1 tablet (0.5 mg total) by mouth daily for 3 days, THEN 1 tablet (0.5 mg total) 2 (two) times daily for 4 days, THEN 2 tablets (1 mg total) 2 (two) times daily. 01/13/22 02/20/22  Rafael Bihari, FNP  warfarin (COUMADIN) 5 MG tablet Take 1.5 tablets to 2 tablets by mouth once daily as directed by Anticoagulation clinic 01/29/22   [name], MD  MITIGARE 0.6 MG CAPS Take 0.6 mg by mouth daily. 12/05/19 04/04/20  [name], MD    Physical Exam  Vital Signs:  Craig Flores does not have vital signs available for review today.  Given telephonic nature of communication, physical exam is limited. AAOx3. NAD. Normal affect.  Speech and respirations are unlabored.  Accessory Clinical Findings    None  Assessment & Plan    1.  Preoperative Cardiovascular Risk Assessment:  The patient affirms he has been doing well without any new cardiac symptoms. They are able to achieve 4 METS without cardiac limitations. Therefore, based on ACC/AHA guidelines and through shared decision making patient realizes his risk is elevated and is in agreement to proceed at this time.  The patient was advised that if he develops new symptoms prior to surgery to contact our office to arrange for a follow-up visit, and he verbalized understanding.   Craig Flores perioperative risk of a major cardiac event is 11% according to the Revised Cardiac Risk Index (RCRI).  Therefore, he is at high risk for perioperative complications.   His functional capacity is fair at 4.31 METs according to the [facility] Activity Status  Index (DASI).  Recommendations: According to ACC/AHA guidelines, no further cardiovascular testing needed.  The patient may proceed to surgery at acceptable risk.   Antiplatelet and/or Anticoagulation Recommendations:  Per office protocol, patient can hold warfarin for 5 days prior to procedure. Patient will need bridging with Lovenox (enoxaparin) around procedure. This will be coordinated Coumadin clinic where pt is followed.  A copy of this note will be routed to requesting surgeon.  Time:   Today, I have spent 6 minutes with the patient with telehealth technology discussing medical history, symptoms, and management plan.     Mable Fill, Marissa Nestle, NP  01/30/2022, 8:52 AM

## 2022-01-31 ENCOUNTER — Other Ambulatory Visit: Payer: Self-pay

## 2022-02-03 ENCOUNTER — Other Ambulatory Visit: Payer: Self-pay

## 2022-02-05 ENCOUNTER — Other Ambulatory Visit: Payer: Self-pay

## 2022-02-10 ENCOUNTER — Other Ambulatory Visit: Payer: Self-pay

## 2022-02-10 ENCOUNTER — Ambulatory Visit: Payer: Medicaid Other | Attending: Cardiology

## 2022-02-10 DIAGNOSIS — Z5181 Encounter for therapeutic drug level monitoring: Secondary | ICD-10-CM | POA: Diagnosis not present

## 2022-02-10 DIAGNOSIS — I513 Intracardiac thrombosis, not elsewhere classified: Secondary | ICD-10-CM

## 2022-02-10 DIAGNOSIS — Z7901 Long term (current) use of anticoagulants: Secondary | ICD-10-CM | POA: Diagnosis not present

## 2022-02-10 LAB — POCT INR: INR: 3.1 — AB (ref 2.0–3.0)

## 2022-02-10 MED ORDER — ENOXAPARIN SODIUM 80 MG/0.8ML IJ SOSY
80.0000 mg | PREFILLED_SYRINGE | Freq: Two times a day (BID) | INTRAMUSCULAR | 1 refills | Status: DC
  Filled 2022-02-10: qty 8, 5d supply, fill #0

## 2022-02-10 NOTE — Patient Instructions (Signed)
Continue taking warfarin 2 tablets daily except for 1.5 tablets on Tuesdays, Thursdays and Saturdays. Recheck INR 2 weeks. Coumadin Clinic [phone] AVR 1/24 Lovenox Bridged BILL [id number]   1/18: Last dose of warfarin.  1/19: No warfarin or enoxaparin (Lovenox).  1/20: Inject enoxaparin '80mg'$  in the fatty abdominal tissue at least 2 inches from the belly button twice a day about 12 hours apart, 8am and 8pm rotate sites. No warfarin.  1/21: Inject enoxaparin in the fatty tissue every 12 hours, 8am and 8pm. No warfarin.  1/22: Inject enoxaparin in the fatty tissue every 12 hours, 8am and 8pm. No warfarin.  1/23: Inject enoxaparin in the fatty tissue in the morning at 8 am (No PM dose). No warfarin.  1/24: Procedure Day - No enoxaparin - Resume warfarin in the evening or as directed by doctor (take an extra half tablet with usual dose for 2 days then resume normal dose).  1/25: Resume enoxaparin inject in the fatty tissue every 12 hours and take warfarin  1/26: Inject enoxaparin in the fatty tissue every 12 hours and take warfarin  1/27: Inject enoxaparin in the fatty tissue every 12 hours and take warfarin  1/28: Inject enoxaparin in the fatty tissue every 12 hours and take warfarin  1/29: Inject enoxaparin in the fatty tissue every 12 hours and take warfarin  1/30: warfarin appt to check INR.

## 2022-02-11 ENCOUNTER — Other Ambulatory Visit: Payer: Self-pay

## 2022-02-12 ENCOUNTER — Other Ambulatory Visit: Payer: Self-pay

## 2022-02-14 ENCOUNTER — Telehealth: Payer: Self-pay

## 2022-02-14 NOTE — Telephone Encounter (Signed)
Received request to device clinic to clear patient for surgery.  Pt was implanted for ICD by Dr. Caryl Comes on 11/28/2019.  He had a wound check appointment on 12/08/2019.  Pt has not been seen in clinic for device interrogation since 12/08/2019.    Pt will need device interrogation and EP appt prior to providing device clearance for surgery.  Will schedule Pt for a device clinic check Monday or Tuesday next week.  Then will schedule virtual visit with Dr. Caryl Comes.  Attempted to contact Pt with no answer.  Will send mychart message.

## 2022-02-14 NOTE — Pre-Procedure Instructions (Signed)
Surgical Instructions    Your procedure is scheduled on Wednesday, February 19, 2022 at 7:30 AM.  Report to [facility] Main Entrance "A" at 5:30 A.M., then check in with the Admitting office.  Call this number if you have problems the morning of surgery:  [phone]   If you have any questions prior to your surgery date call [phone]: Open Monday-Friday 8am-4pm  *If you experience any cold or flu symptoms such as cough, fever, chills, shortness of breath, etc. between now and your scheduled surgery, please notify us.*    Remember:  Do not eat or drink after midnight the night before your surgery    Take these medicines the morning of surgery with A SIP OF WATER:  atorvastatin (LIPITOR)  carvedilol (COREG)  digoxin (LANOXIN)  ezetimibe (ZETIA)  fenofibrate (TRICOR)  omeprazole (PRILOSEC)  sertraline (ZOLOFT)   IF NEEDED: acetaminophen (TYLENOL)  hydrOXYzine (ATARAX)   STOP COUMADIN 5 DAYS PRIOR TO SURGERY (Last dose: 02/14/22) AND DO LOVENOX SHOTS PER PHARMACY RECOMMENDATIONS.  STOP FARXIGA & ENTRESTO 3 DAYS PRIOR TO SURGERY. Last dose should be on 02/15/22.  STOP FISH OIL 1 WEEK PRIOR TO SURGERY. Last dose should be on 02/11/22.  As of today, STOP taking any Aleve, Naproxen, Ibuprofen, Motrin, Advil, Goody's, BC's, all herbal medications, fish oil, and all vitamins.                     Do NOT Smoke (Tobacco/Vaping) for 24 hours prior to your procedure.  If you use a CPAP at night, you may bring your mask/headgear for your overnight stay.   Contacts, glasses, piercing's, hearing aid's, dentures or partials may not be worn into surgery, please bring cases for these belongings.    For patients admitted to the hospital, discharge time will be determined by your treatment team.   Patients discharged the day of surgery will not be allowed to drive home, and someone needs to stay with them for 24 hours.  SURGICAL WAITING ROOM VISITATION Patients having surgery or a  procedure may have two support people in the waiting area. Visitors may stay in the waiting area during the procedure and switch out with other visitors if needed. Only 1 support person is allowed in the pre-op area with the patient AFTER the patient is prepped. This person cannot be switched out. Children under the age of 26 must have an adult accompany them who is not the patient. If the patient needs to stay at the hospital during part of their recovery, the visitor guidelines for inpatient rooms apply.  Please refer to the [facility] website for the visitor guidelines for Inpatients (after your surgery is over and you are in a regular room).    Special instructions:   - Preparing For Surgery  Before surgery, you can play an important role. Because skin is not sterile, your skin needs to be as free of germs as possible. You can reduce the number of germs on your skin by washing with CHG (chlorahexidine gluconate) Soap before surgery.  CHG is an antiseptic cleaner which kills germs and bonds with the skin to continue killing germs even after washing.    Oral Hygiene is also important to reduce your risk of infection.  Remember - BRUSH YOUR TEETH THE MORNING OF SURGERY WITH YOUR REGULAR TOOTHPASTE  Please do not use if you have an allergy to CHG or antibacterial soaps. If your skin becomes reddened/irritated stop using the CHG.  Do not shave (  including legs and underarms) for at least 48 hours prior to first CHG shower. It is OK to shave your face.  Please follow these instructions carefully.   Shower the NIGHT BEFORE SURGERY and the MORNING OF SURGERY  If you chose to wash your hair, wash your hair first as usual with your normal shampoo.  After you shampoo, rinse your hair and body thoroughly to remove the shampoo.  Use CHG Soap as you would any other liquid soap. You can apply CHG directly to the skin and wash gently with a scrungie or a clean washcloth.   Apply the CHG  Soap to your body ONLY FROM THE NECK DOWN.  Do not use on open wounds or open sores. Avoid contact with your eyes, ears, mouth and genitals (private parts). Wash Face and genitals (private parts)  with your normal soap.   Wash thoroughly, paying special attention to the area where your surgery will be performed.  Thoroughly rinse your body with warm water from the neck down.  DO NOT shower/wash with your normal soap after using and rinsing off the CHG Soap.  Pat yourself dry with a CLEAN TOWEL.  Wear CLEAN PAJAMAS to bed the night before surgery  Place CLEAN SHEETS on your bed the night before your surgery  DO NOT SLEEP WITH PETS.   Day of Surgery: Take a shower with CHG soap. Do not wear jewelry or makeup Do not wear lotions, powders, perfumes/colognes, or deodorant. Do not shave 48 hours prior to surgery.  Men may shave face and neck. Do not wear nail polish, gel polish, artificial nails, or any other type of covering on natural nails (fingers and toes) If you have artificial nails or gel coating that need to be removed by a nail salon, please have this removed prior to surgery. Artificial nails or gel coating may interfere with anesthesia's ability to adequately monitor your vital signs. Wear Clean/Comfortable clothing the morning of surgery Do not bring valuables to the hospital.  [facility] is not responsible for any belongings or valuables. Remember to brush your teeth WITH YOUR REGULAR TOOTHPASTE.   Please read over the following fact sheets that you were given.  If you received a COVID test during your pre-op visit  it is requested that you wear a mask when out in public, stay away from anyone that may not be feeling well and notify your surgeon if you develop symptoms. If you have been in contact with anyone that has tested positive in the last 10 days please notify you surgeon.

## 2022-02-17 ENCOUNTER — Ambulatory Visit (HOSPITAL_COMMUNITY)
Admission: RE | Admit: 2022-02-17 | Discharge: 2022-02-17 | Disposition: A | Discharge status: Home / Self Care | Payer: Medicaid Other | Source: Ambulatory Visit | Attending: Thoracic Surgery (Cardiothoracic Vascular Surgery) | Admitting: Thoracic Surgery (Cardiothoracic Vascular Surgery)

## 2022-02-17 ENCOUNTER — Encounter (HOSPITAL_COMMUNITY)
Admission: RE | Admit: 2022-02-17 | Discharge: 2022-02-17 | Disposition: A | Discharge status: Home / Self Care | Payer: Medicaid Other | Source: Ambulatory Visit | Attending: Thoracic Surgery (Cardiothoracic Vascular Surgery) | Admitting: Thoracic Surgery (Cardiothoracic Vascular Surgery)

## 2022-02-17 ENCOUNTER — Encounter (HOSPITAL_COMMUNITY): Payer: Self-pay

## 2022-02-17 ENCOUNTER — Other Ambulatory Visit: Payer: Self-pay

## 2022-02-17 ENCOUNTER — Ambulatory Visit (INDEPENDENT_AMBULATORY_CARE_PROVIDER_SITE_OTHER): Payer: Medicaid Other

## 2022-02-17 VITALS — BP 112/73 | HR 72 | Temp 97.8°F | Resp 18 | Ht 69.0 in | Wt 194.8 lb

## 2022-02-17 DIAGNOSIS — I509 Heart failure, unspecified: Secondary | ICD-10-CM | POA: Diagnosis not present

## 2022-02-17 DIAGNOSIS — Z01818 Encounter for other preprocedural examination: Secondary | ICD-10-CM | POA: Insufficient documentation

## 2022-02-17 DIAGNOSIS — I2510 Atherosclerotic heart disease of native coronary artery without angina pectoris: Secondary | ICD-10-CM | POA: Insufficient documentation

## 2022-02-17 DIAGNOSIS — Z1152 Encounter for screening for COVID-19: Secondary | ICD-10-CM | POA: Insufficient documentation

## 2022-02-17 DIAGNOSIS — F17200 Nicotine dependence, unspecified, uncomplicated: Secondary | ICD-10-CM | POA: Insufficient documentation

## 2022-02-17 DIAGNOSIS — I350 Nonrheumatic aortic (valve) stenosis: Secondary | ICD-10-CM

## 2022-02-17 DIAGNOSIS — I252 Old myocardial infarction: Secondary | ICD-10-CM | POA: Diagnosis not present

## 2022-02-17 DIAGNOSIS — I255 Ischemic cardiomyopathy: Secondary | ICD-10-CM | POA: Diagnosis not present

## 2022-02-17 DIAGNOSIS — E785 Hyperlipidemia, unspecified: Secondary | ICD-10-CM | POA: Insufficient documentation

## 2022-02-17 HISTORY — DX: Dyspnea, unspecified: R06.00

## 2022-02-17 HISTORY — DX: Presence of automatic (implantable) cardiac defibrillator: Z95.810

## 2022-02-17 LAB — BLOOD GAS, ARTERIAL
Acid-Base Excess: 2.3 mmol/L — ABNORMAL HIGH (ref 0.0–2.0)
Bicarbonate: 26.5 mmol/L (ref 20.0–28.0)
Drawn by: 58793
O2 Saturation: 100 %
Patient temperature: 37
pCO2 arterial: 39 mmHg (ref 32–48)
pH, Arterial: 7.44 (ref 7.35–7.45)
pO2, Arterial: 122 mmHg — ABNORMAL HIGH (ref 83–108)

## 2022-02-17 LAB — CBC
HCT: 41.8 % (ref 39.0–52.0)
Hemoglobin: 14.4 g/dL (ref 13.0–17.0)
MCH: 31.5 pg (ref 26.0–34.0)
MCHC: 34.4 g/dL (ref 30.0–36.0)
MCV: 91.5 fL (ref 80.0–100.0)
Platelets: 251 10*3/uL (ref 150–400)
RBC: 4.57 MIL/uL (ref 4.22–5.81)
RDW: 13.4 % (ref 11.5–15.5)
WBC: 5.9 10*3/uL (ref 4.0–10.5)
nRBC: 0 % (ref 0.0–0.2)

## 2022-02-17 LAB — HEMOGLOBIN A1C
Hgb A1c MFr Bld: 5.8 % — ABNORMAL HIGH (ref 4.8–5.6)
Mean Plasma Glucose: 119.76 mg/dL

## 2022-02-17 LAB — CUP PACEART INCLINIC DEVICE CHECK
Battery Remaining Longevity: 161 mo
Brady Statistic RV Percent Paced: 1 % — CL
Date Time Interrogation Session: 20240122142053
HighPow Impedance: 78 Ohm
Implantable Lead Connection Status: 753985
Implantable Lead Implant Date: 20211101
Implantable Lead Location: 753860
Implantable Lead Model: 138
Implantable Lead Serial Number: 303500
Implantable Pulse Generator Implant Date: 20211101
Lead Channel Impedance Value: 459 Ohm
Lead Channel Pacing Threshold Amplitude: 0.9 V
Lead Channel Pacing Threshold Pulse Width: 0.4 ms
Lead Channel Sensing Intrinsic Amplitude: 15.7 mV
Pulse Gen Serial Number: 209902

## 2022-02-17 LAB — COMPREHENSIVE METABOLIC PANEL
ALT: 27 U/L (ref 0–44)
AST: 24 U/L (ref 15–41)
Albumin: 4.3 g/dL (ref 3.5–5.0)
Alkaline Phosphatase: 28 U/L — ABNORMAL LOW (ref 38–126)
Anion gap: 6 (ref 5–15)
BUN: 22 mg/dL — ABNORMAL HIGH (ref 6–20)
CO2: 23 mmol/L (ref 22–32)
Calcium: 9.6 mg/dL (ref 8.9–10.3)
Chloride: 105 mmol/L (ref 98–111)
Creatinine, Ser: 1.01 mg/dL (ref 0.61–1.24)
GFR, Estimated: >60 mL/min (ref >60)
Glucose, Bld: 89 mg/dL (ref 70–99)
Potassium: 4.6 mmol/L (ref 3.5–5.1)
Sodium: 134 mmol/L — ABNORMAL LOW (ref 135–145)
Total Bilirubin: 0.5 mg/dL (ref 0.3–1.2)
Total Protein: 6.6 g/dL (ref 6.5–8.1)

## 2022-02-17 LAB — URINALYSIS, ROUTINE W REFLEX MICROSCOPIC
Bacteria, UA: NONE SEEN
Bilirubin Urine: NEGATIVE
Glucose, UA: >=500 mg/dL — AB
Hgb urine dipstick: NEGATIVE
Ketones, ur: NEGATIVE mg/dL
Leukocytes,Ua: NEGATIVE
Nitrite: NEGATIVE
Protein, ur: NEGATIVE mg/dL
Specific Gravity, Urine: 1.016 (ref 1.005–1.030)
pH: 7 (ref 5.0–8.0)

## 2022-02-17 LAB — TYPE AND SCREEN
ABO/RH(D): A POS
Antibody Screen: NEGATIVE

## 2022-02-17 LAB — SURGICAL PCR SCREEN
MRSA, PCR: NEGATIVE
Staphylococcus aureus: NEGATIVE

## 2022-02-17 LAB — APTT: aPTT: 32 seconds (ref 24–36)

## 2022-02-17 NOTE — Progress Notes (Addendum)
PCP - NO PCP per pt Cardiologist - Dr.Dalton Aundra Dubin  PPM/ICD -yes, Michel left [employer] rep. on Friday to notify of patient's upcoming surgery.   Device Orders - none; patient states he has ICD follow up appointment this afternoon.  Rep Notified - yes via voicemail.   Chest x-ray - 02/17/22 EKG - 02/17/22 Stress Test - 07/26/20 ECHO - 11/20/21 Cardiac Cath - 12/12/21  Sleep Study - pt denies CPAP - n/a  Fasting Blood Sugar - pt denies diabetes Checks Blood Sugar _____ times a day  Last dose of GLP1 agonist-  pt denies GLP1 instructions: n/a  Blood Thinner Instructions:STOP COUMADIN 5 DAYS PRIOR TO SURGERY (Last dose: 02/14/22) AND DO LOVENOX SHOTS PER PHARMACY RECOMMENDATIONS. Per Pt has stopped Coumadin as directed and using the Lovenox as directed.   STOP FARXIGA & ENTRESTO 3 DAYS PRIOR TO SURGERY. Last dose should be on 02/15/22.   STOP FISH OIL 1 WEEK PRIOR TO SURGERY. Last dose should be on 02/11/22. Patient states he did not stop [place], entresto, or fish oil-last dose today. Ryan Brooks,RN notified via secure chat. Patient notified to stop these medications starting today per Franchot Heidelberg.    Aspirin Instructions:Follow your surgeon's instructions on when to stop Aspirin.  ERAS Protcol -NPO after midnight PRE-SURGERY Ensure or G2- none ordered   COVID TEST- YES   Anesthesia review: YES. Heart history, ICD.    Patient denies shortness of breath, fever, cough and chest pain at PAT appointment   All instructions explained to the patient, with a verbal understanding of the material. Patient agrees to go over the instructions while at home for a better understanding. Patient also instructed to self quarantine after being tested for COVID-19. The opportunity to ask questions was provided.

## 2022-02-17 NOTE — Progress Notes (Signed)
ICD check in clinic. Normal device function. Thresholds and sensing consistent with previous device measurements. Impedance trends stable over time. No mode switches. No ventricular arrhythmias. Histogram distribution appropriate for patient and level of activity. Device programmed at appropriate safety margins. Program changes made to decrease pacing threshold SM from 3.5 @.28m to 2.5@.447min respect of chronic lead and better battery support.  Device programmed to optimize intrinsic conduction. Estimated longevity 13.5 years. Pt enrolled in remote follow-up. Patient education completed including shock plan. Auditory/vibratory alert demonstrated.

## 2022-02-17 NOTE — Patient Instructions (Addendum)
Thank you for coming today.   Your device check was normal.  We will complete your clearance for your procedure after you see Dr. Caryl Comes tomorrow.  Your telephone visit with Dr. Caryl Comes is at 2:45pm on 02/18/22.   If you have any questions please don't hesitate to call the device clinic at [phone]

## 2022-02-18 ENCOUNTER — Encounter: Payer: Self-pay | Admitting: Internal Medicine

## 2022-02-18 ENCOUNTER — Telehealth: Payer: Self-pay

## 2022-02-18 ENCOUNTER — Other Ambulatory Visit: Payer: Self-pay

## 2022-02-18 ENCOUNTER — Ambulatory Visit: Payer: Medicaid Other | Attending: Internal Medicine | Admitting: Internal Medicine

## 2022-02-18 DIAGNOSIS — I5020 Unspecified systolic (congestive) heart failure: Secondary | ICD-10-CM

## 2022-02-18 DIAGNOSIS — I255 Ischemic cardiomyopathy: Secondary | ICD-10-CM

## 2022-02-18 DIAGNOSIS — Z95810 Presence of automatic (implantable) cardiac defibrillator: Secondary | ICD-10-CM | POA: Diagnosis not present

## 2022-02-18 DIAGNOSIS — I513 Intracardiac thrombosis, not elsewhere classified: Secondary | ICD-10-CM | POA: Diagnosis not present

## 2022-02-18 DIAGNOSIS — R000 Tachycardia, unspecified: Secondary | ICD-10-CM

## 2022-02-18 LAB — SARS CORONAVIRUS 2 (TAT 6-24 HRS): SARS Coronavirus 2: NEGATIVE

## 2022-02-18 MED ORDER — PHENYLEPHRINE HCL-NACL 20-0.9 MG/250ML-% IV SOLN
30.0000 ug/min | INTRAVENOUS | Status: AC
  Administered 2022-02-19: 20 ug/min via INTRAVENOUS
  Filled 2022-02-18: qty 250

## 2022-02-18 MED ORDER — HEPARIN 30,000 UNITS/1000 ML (OHS) CELLSAVER SOLUTION
Status: DC
  Filled 2022-02-18: qty 1000

## 2022-02-18 MED ORDER — NOREPINEPHRINE 4 MG/250ML-% IV SOLN
0.0000 ug/min | INTRAVENOUS | Status: DC
  Filled 2022-02-18: qty 250

## 2022-02-18 MED ORDER — MANNITOL 20 % IV SOLN
INTRAVENOUS | Status: DC
  Filled 2022-02-18: qty 13

## 2022-02-18 MED ORDER — MILRINONE LACTATE IN DEXTROSE 20-5 MG/100ML-% IV SOLN
0.3000 ug/kg/min | INTRAVENOUS | Status: AC
  Administered 2022-02-19: .3 ug/kg/min via INTRAVENOUS
  Filled 2022-02-18: qty 100

## 2022-02-18 MED ORDER — INSULIN REGULAR(HUMAN) IN NACL 100-0.9 UT/100ML-% IV SOLN
INTRAVENOUS | Status: AC
  Administered 2022-02-19: 1.2 [IU]/h via INTRAVENOUS
  Filled 2022-02-18: qty 100

## 2022-02-18 MED ORDER — NITROGLYCERIN IN D5W 200-5 MCG/ML-% IV SOLN
2.0000 ug/min | INTRAVENOUS | Status: DC
  Filled 2022-02-18: qty 250

## 2022-02-18 MED ORDER — TRANEXAMIC ACID (OHS) BOLUS VIA INFUSION
15.0000 mg/kg | INTRAVENOUS | Status: AC
  Administered 2022-02-19: 1326 mg via INTRAVENOUS
  Filled 2022-02-18: qty 1326

## 2022-02-18 MED ORDER — VANCOMYCIN HCL 1000 MG IV SOLR
INTRAVENOUS | Status: DC
  Filled 2022-02-18: qty 20

## 2022-02-18 MED ORDER — TRANEXAMIC ACID (OHS) PUMP PRIME SOLUTION
2.0000 mg/kg | INTRAVENOUS | Status: DC
  Filled 2022-02-18: qty 1.77

## 2022-02-18 MED ORDER — CEFAZOLIN SODIUM-DEXTROSE 2-4 GM/100ML-% IV SOLN
2.0000 g | INTRAVENOUS | Status: AC
  Administered 2022-02-19 (×2): 2 g via INTRAVENOUS
  Filled 2022-02-18: qty 100

## 2022-02-18 MED ORDER — TRANEXAMIC ACID 1000 MG/10ML IV SOLN
1.5000 mg/kg/h | INTRAVENOUS | Status: AC
  Administered 2022-02-19: 1.5 mg/kg/h via INTRAVENOUS
  Filled 2022-02-18: qty 25

## 2022-02-18 MED ORDER — VANCOMYCIN HCL 1500 MG/300ML IV SOLN
1500.0000 mg | INTRAVENOUS | Status: AC
  Administered 2022-02-19: 1500 mg via INTRAVENOUS
  Filled 2022-02-18: qty 300

## 2022-02-18 MED ORDER — DEXMEDETOMIDINE HCL IN NACL 400 MCG/100ML IV SOLN
0.1000 ug/kg/h | INTRAVENOUS | Status: AC
  Administered 2022-02-19: .7 ug/kg/h via INTRAVENOUS
  Filled 2022-02-18: qty 100

## 2022-02-18 MED ORDER — POTASSIUM CHLORIDE 2 MEQ/ML IV SOLN
80.0000 meq | INTRAVENOUS | Status: DC
  Filled 2022-02-18: qty 40

## 2022-02-18 MED ORDER — CEFAZOLIN SODIUM-DEXTROSE 2-4 GM/100ML-% IV SOLN
2.0000 g | INTRAVENOUS | Status: DC
  Filled 2022-02-18: qty 100

## 2022-02-18 MED ORDER — EPINEPHRINE HCL 5 MG/250ML IV SOLN IN NS
0.0000 ug/min | INTRAVENOUS | Status: AC
  Administered 2022-02-19: 2 ug/min via INTRAVENOUS
  Filled 2022-02-18: qty 250

## 2022-02-18 MED ORDER — PLASMA-LYTE A IV SOLN
INTRAVENOUS | Status: DC
  Filled 2022-02-18: qty 2.5

## 2022-02-18 NOTE — Telephone Encounter (Signed)
  Patient Consent for Virtual Visit        Craig Flores has provided verbal consent on 02/18/2022 for a virtual visit (video or telephone).   CONSENT FOR VIRTUAL VISIT FOR:  Craig Flores  By participating in this virtual visit I agree to the following:  I hereby voluntarily request, consent and authorize [facility] and its employed or contracted physicians, physician assistants, nurse practitioners or other licensed health care professionals (the Practitioner), to provide me with telemedicine health care services (the "Services") as deemed necessary by the treating Practitioner. I acknowledge and consent to receive the Services by the Practitioner via telemedicine. I understand that the telemedicine visit will involve communicating with the Practitioner through live audiovisual communication technology and the disclosure of certain medical information by electronic transmission. I acknowledge that I have been given the opportunity to request an in-person assessment or other available alternative prior to the telemedicine visit and am voluntarily participating in the telemedicine visit.  I understand that I have the right to withhold or withdraw my consent to the use of telemedicine in the course of my care at any time, without affecting my right to future care or treatment, and that the Practitioner or I may terminate the telemedicine visit at any time. I understand that I have the right to inspect all information obtained and/or recorded in the course of the telemedicine visit and may receive copies of available information for a reasonable fee.  I understand that some of the potential risks of receiving the Services via telemedicine include:  Delay or interruption in medical evaluation due to technological equipment failure or disruption; Information transmitted may not be sufficient (e.g. poor resolution of images) to allow for appropriate medical decision making by the  Practitioner; and/or  In rare instances, security protocols could fail, causing a breach of personal health information.  Furthermore, I acknowledge that it is my responsibility to provide information about my medical history, conditions and care that is complete and accurate to the best of my ability. I acknowledge that Practitioner's advice, recommendations, and/or decision may be based on factors not within their control, such as incomplete or inaccurate data provided by me or distortions of diagnostic images or specimens that may result from electronic transmissions. I understand that the practice of medicine is not an exact science and that Practitioner makes no warranties or guarantees regarding treatment outcomes. I acknowledge that a copy of this consent can be made available to me via my patient portal ([place]), or I can request a printed copy by calling the office of [facility].    I understand that my insurance will be billed for this visit.   I have read or had this consent read to me. I understand the contents of this consent, which adequately explains the benefits and risks of the Services being provided via telemedicine.  I have been provided ample opportunity to ask questions regarding this consent and the Services and have had my questions answered to my satisfaction. I give my informed consent for the services to be provided through the use of telemedicine in my medical care

## 2022-02-18 NOTE — Patient Instructions (Signed)
Medication Instructions:  Your physician recommends that you continue on your current medications as directed. Please refer to the Current Medication list given to you today.  *If you need a refill on your cardiac medications before your next appointment, please call your pharmacy*   Lab Work: None ordered.  If you have labs (blood work) drawn today and your tests are completely normal, you will receive your results only by: [place] (if you have MyChart) OR A paper copy in the mail If you have any lab test that is abnormal or we need to change your treatment, we will call you to review the results.   Testing/Procedures: None ordered.    Follow-Up: At [facility], you and your health needs are our priority.  As part of our continuing mission to provide you with exceptional heart care, we have created designated Provider Care Teams.  These Care Teams include your primary Cardiologist (physician) and Advanced Practice Providers (APPs -  Physician Assistants and Nurse Practitioners) who all work together to provide you with the care you need, when you need it.  We recommend signing up for the patient portal called "MyChart".  Sign up information is provided on this After Visit Summary.  MyChart is used to connect with patients for Virtual Visits (Telemedicine).  Patients are able to view lab/test results, encounter notes, upcoming appointments, etc.  Non-urgent messages can be sent to your provider as well.   To learn more about what you can do with MyChart, go to [email or web address].    Your next appointment:   6 months with Dr Caryl Comes

## 2022-02-18 NOTE — Telephone Encounter (Signed)
I left the patient a message that I was calling to get him ready for his telephone appointment and for him to call the office back.

## 2022-02-18 NOTE — Telephone Encounter (Signed)
Device clearance completed and faxed as requested.

## 2022-02-18 NOTE — Anesthesia Preprocedure Evaluation (Signed)
Anesthesia Evaluation  Patient identified by MRN, date of birth, ID band Patient awake    Reviewed: Allergy & Precautions, H&P , NPO status , Patient's Chart, lab work & pertinent test results  Airway Mallampati: II  TM Distance: >3 FB Neck ROM: Full    Dental no notable dental hx. (+) Teeth Intact, Dental Advisory Given   Pulmonary neg pulmonary ROS, Current Smoker and Patient abstained from smoking.   Pulmonary exam normal breath sounds clear to auscultation       Cardiovascular Exercise Tolerance: Good + Past MI and +CHF  + Cardiac Defibrillator + Valvular Problems/Murmurs AS  Rhythm:Regular Rate:Normal     Neuro/Psych  Headaches  Anxiety Depression     negative psych ROS   GI/Hepatic Neg liver ROS,GERD  Medicated,,  Endo/Other  negative endocrine ROS    Renal/GU negative Renal ROS  negative genitourinary   Musculoskeletal   Abdominal   Peds  Hematology negative hematology ROS (+)   Anesthesia Other Findings   Reproductive/Obstetrics negative OB ROS                             Anesthesia Physical Anesthesia Plan  ASA: 4  Anesthesia Plan: General   Post-op Pain Management: Tylenol PO (pre-op)*   Induction: Intravenous  PONV Risk Score and Plan: 1 and Midazolam  Airway Management Planned: Oral ETT  Additional Equipment: Arterial line, CVP, PA Cath, TEE and Ultrasound Guidance Line Placement  Intra-op Plan:   Post-operative Plan: Post-operative intubation/ventilation  Informed Consent: I have reviewed the patients History and Physical, chart, labs and discussed the procedure including the risks, benefits and alternatives for the proposed anesthesia with the patient or authorized representative who has indicated his/her understanding and acceptance.     Dental advisory given  Plan Discussed with: CRNA  Anesthesia Plan Comments:        Anesthesia Quick  Evaluation

## 2022-02-18 NOTE — Progress Notes (Signed)
[facility]GRAM[facility]  Patient Information: Name:  Keishawn Rajewski  DOB:  1965-12-04  MRN:  [id number]  Planned Procedure:  Aortic Valve Replacement  Surgeon:  Dr. Coralie Common  Date of Procedure:  02/19/22 @ 0730  Position during surgery:  Supine   Device Information:  Clinic EP Physician:  [name], MD   Device Type:  Defibrillator Manufacturer and Phone #:  [employer]ne [employer] Scientific: [phone] Pacemaker Dependent?:  No. Date of Last Device Check:  02/17/2022 Normal Device Function?:  Yes.    Electrophysiologist's Recommendations:  Have magnet available. Provide continuous ECG monitoring when magnet is used or reprogramming is to be performed.  Procedure will likely interfere with device function.  Device should be programmed:  Tachy therapies disabled  Per Device Clinic Standing Orders, Damian Leavell, RN  4:01 PM 02/18/2022

## 2022-02-18 NOTE — H&P (Signed)
[address]       [place] [address]             [phone]                                   Craig Flores [place] Medical Record #[id number] Date of Birth: 1965-07-11   [name], MD Pcp, No   Chief Complaint:   Aortic Stenosis   History of Present Illness:     Pt is a very pleasant 57 yo wm who suffered an anterior MI several years ago that has led to ischemic cardiomyopathy with an EF of 25-30% that has also been noted with chronic LV thrombus treated with coumadin. Pt has had a defibrillator placed over the past two years but has been having increasing DOE and dizziness the past several months. Pt had echo confirming LV dysfunction with EF 25-30% but now with severe low flow low gradient AS with a calculated valve area of .88cm2 and a mean gradient of 54mHg. He as what appears to be a bicuspid valve. Secondary to his LV thrombus and his age it was felt pt may best be served with SAVR. Pt has had cath with occluded chronic LAD without target and moderate RCA stenosis.             Past Medical History:  Diagnosis Date   Acid reflux     Anxiety     Aortic stenosis with bicuspid valve     CHF (congestive heart failure) (HCC)     Depression     GERD (gastroesophageal reflux disease)     HLD (hyperlipidemia)     Ischemic cardiomyopathy     Migraines     Myocardial infarction ([facility]             Past Surgical History:  Procedure Laterality Date   COLONOSCOPY WITH PROPOFOL N/A 07/16/2021    Procedure: COLONOSCOPY WITH PROPOFOL;  Surgeon: GGatha Mayer MD;  [facility];  Service: Gastroenterology;  Laterality: N/A;   CORONARY/GRAFT ACUTE MI REVASCULARIZATION N/A 08/28/2019    Procedure: Coronary/Graft Acute MI Revascularization;  Surgeon: BLorretta Harp MD;  [facility];  Service: Cardiovascular;  Laterality: N/A;   ICD IMPLANT N/A 11/28/2019    Procedure: ICD IMPLANT;  Surgeon: KDeboraha Sprang MD;   [facility];  Service: Cardiovascular;  Laterality: N/A;   LEFT HEART CATH AND CORONARY ANGIOGRAPHY N/A 08/28/2019    Procedure: LEFT HEART CATH AND CORONARY ANGIOGRAPHY;  Surgeon: BLorretta Harp MD;  [facility];  Service: Cardiovascular;  Laterality: N/A;   POLYPECTOMY   07/16/2021    Procedure: POLYPECTOMY;  Surgeon: GGatha Mayer MD;  [facility];  Service: Gastroenterology;;   RIGHT HEART CATH AND CORONARY ANGIOGRAPHY N/A 12/12/2021    Procedure: RIGHT HEART CATH AND CORONARY ANGIOGRAPHY;  Surgeon: MBurnell Blanks MD;  [facility];  Service: Cardiovascular;  Laterality: N/A;   WISDOM TOOTH EXTRACTION          Social History        Tobacco Use  Smoking Status Every Day   Packs/day: 0.50   Types: Cigarettes  Smokeless Tobacco Never  Tobacco Comments    x15 yrs as of 2021    Social History        Substance  and Sexual Activity  Alcohol Use Yes    Comment: a couple of drinks on the weekends      Social History         Socioeconomic History   Marital status: Married      Spouse name: Not on file   Number of children: 0   Years of education: Not on file   Highest education level: Not on file  Occupational History   Occupation: Probation officer   Occupation: Attorney/Writer  Tobacco Use   Smoking status: Every Day      Packs/day: 0.50      Types: Cigarettes   Smokeless tobacco: Never   Tobacco comments:      x15 yrs as of 2021  Vaping Use   Vaping Use: Never used  Substance and Sexual Activity   Alcohol use: Yes      Comment: a couple of drinks on the weekends   Drug use: Never   Sexual activity: Yes      Birth control/protection: None  Other Topics Concern   Not on file  Social History Narrative    2 adopted children    Social Determinants of Health    Financial Resource Strain: Not on file  Food Insecurity: Not on file  Transportation Needs: Not on file  Physical Activity: Not on file   Stress: Not on file  Social Connections: Not on file  Intimate Partner Violence: Not on file      No Known Allergies         Current Outpatient Medications  Medication Sig Dispense Refill   acetaminophen (TYLENOL) 500 MG tablet Take 500 mg by mouth every 6 (six) hours as needed for headache.       aspirin 81 MG chewable tablet Chew 1 tablet (81 mg total) by mouth daily. 90 tablet 3   atorvastatin (LIPITOR) 80 MG tablet Take 1 tablet (80 mg total) by mouth daily. 30 tablet 11   carvedilol (COREG) 12.5 MG tablet Take 1 tablet (12.5 mg total) by mouth 2 (two) times daily with a meal. 60 tablet 6   dapagliflozin propanediol (FARXIGA) 10 MG TABS tablet Take 1 tablet (10 mg total) by mouth daily before breakfast. 30 tablet 11   digoxin (LANOXIN) 0.125 MG tablet Take 1/2 tablet (0.0625 mg total) by mouth daily. 30 tablet 6   enoxaparin (LOVENOX) 80 MG/0.8ML injection Inject 0.8 mLs (80 mg total) into the skin every 12 (twelve) hours. 16 mL 1   ezetimibe (ZETIA) 10 MG tablet Take 1 tablet (10 mg total) by mouth daily. 90 tablet 0   fenofibrate (TRICOR) 145 MG tablet Take 1 tablet (145 mg total) by mouth daily. 90 tablet 3   furosemide (LASIX) 20 MG tablet Take 1 tablet (20 mg total) by mouth as needed. (Patient taking differently: Take 20 mg by mouth daily as needed for edema.) 45 tablet 3   hydrOXYzine (ATARAX) 25 MG tablet Take 1 tablet (25 mg total) by mouth 3 (three) times daily as needed. 90 tablet 3   melatonin 5 MG TABS Take 10 mg by mouth at bedtime.       mirtazapine (REMERON) 45 MG tablet Take 1 tablet (45 mg total) by mouth at bedtime. 30 tablet 3   Multiple Vitamin (MULTIVITAMIN WITH MINERALS) TABS tablet Take 1 tablet by mouth daily.       omega-3 acid ethyl esters (LOVAZA) 1 g capsule TAKE 2 CAPSULES (2 GRAMS TOTAL) BY MOUTH TWO TIMES DAILY. 120 capsule 3  omeprazole (PRILOSEC) 20 MG capsule Take 20 mg by mouth daily as needed (acid reflux).       Probiotic Product (PROBIOTIC DAILY  PO) Take 1 capsule by mouth daily.       sacubitril-valsartan (ENTRESTO) 97-103 MG Take 1 tablet by mouth 2 (two) times daily. 60 tablet 11   sertraline (ZOLOFT) 100 MG tablet Take 1.5 tablets (150 mg total) by mouth daily. 45 tablet 3   SILDENAFIL CITRATE PO Take 1-2 tablets by mouth daily as needed (ED). 30 mg tablet - take 1 - 2 tablets 30 to 45 min before sexual activity as needed       spironolactone (ALDACTONE) 25 MG tablet Take 1 tablet (25 mg total) by mouth daily. 30 tablet 11   warfarin (COUMADIN) 5 MG tablet Take 1.5 to 2 tablets by mouth once daily as directed by Anticoagulation clinic (Patient taking differently: Take 7.5-10 mg by mouth See admin instructions. Take 1.5 to 2 tablets by mouth once daily as directed by Anticoagulation clinic; 10 mg Sun Wed Fri, 7.5 mg all other days) 50 tablet 3    No current facility-administered medications for this visit.             Family History  Problem Relation Age of Onset   Cancer Mother          peritoneal   Arrhythmia Father     Prostate cancer Father     Colon cancer Brother 72   Cancer Maternal Grandmother          type unknown   Heart attack Maternal Grandfather     Diabetes Paternal Grandmother     Heart attack Paternal Grandfather              Physical Exam: Teeth in good repair Lungs: clear Cardiac: RR with soft systolic murmur of AS Ext: no edema Neuro: no focal deficits         Diagnostic Studies & Laboratory data: I have personally reviewed the following studies and agree with the findings   TTE (10/2021) IMPRESSIONS   1. Left ventricular ejection fraction, by estimation, is 25 to 30%. The  left ventricle has severely decreased function. The left ventricle  demonstrates regional wall motion abnormalities with mid to apical  inferoseptal and anteroseptal akinesis,  akinesis of the apical anterior/inferior/lateral walls and the true apex.  There was chronic-appearing LV thrombus at the apex. There is mild  left  ventricular hypertrophy. Left ventricular diastolic parameters are  consistent with Grade I diastolic  dysfunction (impaired relaxation).   2. Right ventricular systolic function is normal. The right ventricular  size is normal.   3. The mitral valve is normal in structure. Trivial mitral valve  regurgitation. No evidence of mitral stenosis.   4. The aortic valve is bicuspid. There is moderate calcification of the  aortic valve. Aortic valve regurgitation is not visualized. Severe low  flow low gradient aortic valve stenosis. Aortic valve area, by VTI  measures 0.88 cm. Aortic valve mean  gradient measures 27.0 mmHg.   5. Aortic dilatation noted. There is mild dilatation of the ascending  aorta, measuring 37 mm.   6. The inferior vena cava is normal in size with greater than 50%  respiratory variability, suggesting right atrial pressure of 3 mmHg.   FINDINGS   Left Ventricle: Left ventricular ejection fraction, by estimation, is 25  to 30%. The left ventricle has severely decreased function. The left  ventricle demonstrates regional wall motion abnormalities. The left  ventricular internal cavity size was normal   in size. There is mild left ventricular hypertrophy. Left ventricular  diastolic parameters are consistent with Grade I diastolic dysfunction  (impaired relaxation).   Right Ventricle: The right ventricular size is normal. No increase in  right ventricular wall thickness. Right ventricular systolic function is  normal.   Left Atrium: Left atrial size was normal in size.   Right Atrium: Right atrial size was normal in size.   Pericardium: There is no evidence of pericardial effusion.   Mitral Valve: The mitral valve is normal in structure. Trivial mitral  valve regurgitation. No evidence of mitral valve stenosis.   Tricuspid Valve: The tricuspid valve is normal in structure. Tricuspid  valve regurgitation is not demonstrated.   Aortic Valve: The aortic valve  is bicuspid. There is moderate  calcification of the aortic valve. Aortic valve regurgitation is not  visualized. Severe aortic stenosis is present. Aortic valve mean gradient  measures 27.0 mmHg. Aortic valve peak gradient  measures 44.1 mmHg. Aortic valve area, by VTI measures 0.88 cm.   Pulmonic Valve: The pulmonic valve was normal in structure. Pulmonic valve  regurgitation is not visualized.   Aorta: Aortic dilatation noted. There is mild dilatation of the ascending  aorta, measuring 37 mm.   Venous: The inferior vena cava is normal in size with greater than 50%  respiratory variability, suggesting right atrial pressure of 3 mmHg.   IAS/Shunts: No atrial level shunt detected by color flow Doppler.   Additional Comments: A device lead is visualized in the right ventricle.     LEFT VENTRICLE  PLAX 2D  LVIDd:         5.30 cm   Diastology  LVIDs:         3.50 cm   LV e' medial:    6.60 cm/s  LV PW:         0.90 cm   LV E/e' medial:  9.0  LV IVS:        0.90 cm   LV e' lateral:   5.18 cm/s  LVOT diam:     2.50 cm   LV E/e' lateral: 11.5  LV SV:         67  LV SV Index:   33  LVOT Area:     4.91 cm                             3D Volume EF:                           3D EF:        37 %                           LV EDV:       263 ml                           LV ESV:       165 ml                           LV SV:        98 ml   RIGHT VENTRICLE  RV S prime:     37.90 cm/s  TAPSE (M-mode): 1.6 cm   LEFT ATRIUM  Index  LA Vol (A2C):   48.7 ml 24.23 ml/m  LA Vol (A4C):   59.8 ml 29.76 ml/m  LA Biplane Vol: 59.8 ml 29.76 ml/m   AORTIC VALVE  AV Area (Vmax):    0.86 cm  AV Area (Vmean):   0.94 cm  AV Area (VTI):     0.88 cm  AV Vmax:           332.00 cm/s  AV Vmean:          237.400 cm/s  AV VTI:            0.755 m  AV Peak Grad:      44.1 mmHg  AV Mean Grad:      27.0 mmHg  LVOT Vmax:         58.20 cm/s  LVOT Vmean:        45.700 cm/s  LVOT VTI:           0.136 m  LVOT/AV VTI ratio: 0.18    AORTA  Ao Asc diam: 3.70 cm   MITRAL VALVE  MV Area (PHT): 4.99 cm    SHUNTS  MV Decel Time: 152 msec    Systemic VTI:  0.14 m  MV E velocity: 59.60 cm/s  Systemic Diam: 2.50 cm  MV A velocity: 84.20 cm/s  MV E/A ratio:  0.71    CATH (11/2021) Conclusion    Prox RCA to Mid RCA lesion is 50% stenosed.   Mid LAD lesion is 100% stenosed.   Chronic occlusion of the mid LAD. There is no clear collateral filling of the distal LAD. Patent large diagonal branch.  Patent large Circumflex and intermediate branch with no significant plaque Large dominant RCA with moderate mid stenosis.  Severe AS by echo. I did not cross the aortic valve today secondary to known chronic LV thrombus Normal right heart pressures.     Recent Radiology Findings:   CTA (11/2021) FINDINGS: Aortic Valve: Moderately thickened moderately calcific bicuspid aortic valve Siever's Type I with calcified raphe at L-R Fusion. There is no significant tapering into the LVOT. Reduced excursion the planimeter valve area is 1.57 Sq cm consistent with moderate to severe aortic stenosis   Annular calcification: None   Aortic Valve Calcium Score: 1246   Presence of severe basal septal hypertrophy: No   Perimembranous septal diameter: 8 mm   Mitral Valve: No calcification   Aortic Annulus Measurements- 30%   Major annulus diameter: 30 mm   Minor annulus diameter: 25 mm   Annular perimeter: 87 mm   Annular area: 5.71 cm2   Aortic Root Measurements   Sinotubular Junction: 34 mm   Ascending Thoracic Aorta: 36 mm   Aortic Arch: 24 mm   Descending Thoracic Aorta: 24 mm   Sinus of Valsalva Measurements:   Right coronary cusp width: 33 mm   Left coronary cusp width: 32 mm   Non coronary cusp width: 34 mm   Coronary Artery Height above Annulus:   Left Main: 17 mm   Left SoV height: 24 mm   Right Coronary: 15 mm   Right SoV height: 23 mm   Optimum Fluoroscopic  Angle for Delivery: LAO 2, CAU 1   Cusp overlay view angle: RAO 0, CAU 5   Valves for structural team consideration:   29 mm Sapien Valve, embedded geometry scene saved for this valve   Non TAVR Valve Findings:   Coronary Arteries: Normal coronary origin. Study not completed with nitroglycerin.   Coronary  Calcium Score:   Left main: 0   Left anterior descending artery: 25   Left circumflex artery: 0   Right coronary artery: 38   Total: 32   Percentile: 71st for age, sex, and race matched control.   Systemic veins: Normal anatomy   Main Pulmonary artery: Mild dilation 30 mm   Pulmonary veins: Normal anatomy   Left atrial appendage: Patent   Interatrial septum: No communications   Left ventricle: Mild dilation. There is layered density with average HU of 55 consistent with a chronic, layered, LV apical thrombus.   Left atrium: Mild dilation   Right ventricle: Normal size   Right atrium: Mild dilation   Pericardium: No pericardial effusion   CIED: There is a single CIED leads present that course through superior vena cava and that terminates in the right ventricle. The CIED leads touch the wall of the SVC for >= 1 cm. There is a CIED lead the is embedded within the RV apex. The CIED lead does not terminate beyond the RV wall. No CIED lead thrombus or lead fracture was detected.   Extra Cardiac Findings as per separate reporting.   IMPRESSION: 1. Bicuspid Aortic stenosis. Findings pertinent to TAVR procedure are detailed above.   RECOMMENDATIONS:   The proposed cut-off value of 1,651 AU yielded a 93 % sensitivity and 75 % specificity in grading AS severity in patients with classical low-flow, low-gradient AS. Proposed different cut-off values to define severe AS for men and women as 2,065 AU and 1,274 AU, respectively. The joint European and American recommendations for the assessment of AS consider the aortic valve calcium score as a continuum - a  very high calcium score suggests severe AS and a low calcium score suggests severe AS is unlikely.     Recent Lab Findings: Recent Labs       Lab Results  Component Value Date    WBC 8.7 12/02/2021    HGB 12.9 (L) 12/12/2021    HCT 38.0 (L) 12/12/2021    PLT 322 12/02/2021    GLUCOSE 91 12/02/2021    CHOL 126 02/18/2021    TRIG 99 02/18/2021    HDL 40 (L) 02/18/2021    LDLDIRECT 90.2 09/02/2019    LDLCALC 66 02/18/2021    ALT 33 12/13/2019    AST 23 12/13/2019    NA 139 12/12/2021    K 4.2 12/12/2021    CL 101 12/02/2021    CREATININE 1.22 12/02/2021    BUN 23 12/02/2021    CO2 24 12/02/2021    TSH 3.181 08/28/2019    INR 3.2 (A) 12/23/2021    HGBA1C 5.8 (H) 08/28/2019            Assessment / Plan:     Pt with severe low flow low gradient AS with ischemic cardiomyopathy and bicuspid valve stenosis with NYHA class 2 symptoms I agree with age and LV thrombus, SAVR best option for therapy of his AS. We discussed the valve options and mechanical valve would seem the optimal choice and he understands then the need for life long anticoagulation with coumadin. He understands that with his cardiomyopathy his risks of surgery are increased and along with the risk of stroke due to his chronic LV thrombus. He understands this and all the recovery issues with surgery and he wishes to proceed. We have chosen January 24th for surgery and he will need to be off coumadin and bridged with lovenox for 5 days prior.

## 2022-02-18 NOTE — Progress Notes (Signed)
Electrophysiology TeleHealth Note   Due to national recommendations of social distancing due to COVID 19, an audio/video telehealth visit is felt to be most appropriate for this patient at this time.  See MyChart message from today for the patient's consent to telehealth for [facility].   Date:  02/18/2022   ID:  Craig Flores, DOB July 16, 1965, MRN [id number]  Location: patient's home  Provider location: [address], [place]  Evaluation Performed: Follow-up visit  PCP:  Pcp, No  Cardiologist:   CMac Electrophysiologist:  SK   Chief Complaint:  ICD and preop  History of Present Illness:    Craig Flores is a 57 y.o. male who presents via audio/video conferencing for a telehealth visit today.  Since last being seen in our clinic for consideration of an ICD for primary prevention in ICM with LAD-T w collaterals and cx by mural thrombus implanted for which he underwent [employer] implant 11/21 but had not followed up since then, seen in office yday for interrogation the patient reports doing stably  He has AVR scheduled for tomorrow Interval eval for aortic stenosis, severe low gradient with symptoms of LH and SOB, chronic hypotension  DATE TEST EF    8/21 LHC   LAD T w/ collaterals  8//21 Echo   45 % LV thrombus  9/21 Echo  25-30%    10/23 Echo  25-30% Chronic thrombus  11/23 LHC  LAD-T w collaterals RCS 50% AS bicuspid severe Mean Grad 27     Date Cr K Hgb  9/21 0.93 4.9 13.5(8/21)                  Past Medical History:  Diagnosis Date   Acid reflux    AICD (automatic cardioverter/defibrillator) present    Anxiety    Aortic stenosis with bicuspid valve    CHF (congestive heart failure) (HCC)    Depression    Dyspnea    GERD (gastroesophageal reflux disease)    HLD (hyperlipidemia)    Ischemic cardiomyopathy    Migraines    Myocardial infarction ([place]) 08/28/2019    Past Surgical History:  Procedure Laterality Date    COLONOSCOPY WITH PROPOFOL N/A 07/16/2021   Procedure: COLONOSCOPY WITH PROPOFOL;  Surgeon: Gatha Mayer, MD;  Location: [facility];  Service: Gastroenterology;  Laterality: N/A;   CORONARY/GRAFT ACUTE MI REVASCULARIZATION N/A 08/28/2019   Procedure: Coronary/Graft Acute MI Revascularization;  Surgeon: [name], MD;  Location: [place] [facility];  Service: Cardiovascular;  Laterality: N/A;   ICD IMPLANT N/A 11/28/2019   Procedure: ICD IMPLANT;  Surgeon: Deboraha Sprang, MD;  Location: [place] [facility];  Service: Cardiovascular;  Laterality: N/A;   LEFT HEART CATH AND CORONARY ANGIOGRAPHY N/A 08/28/2019   Procedure: LEFT HEART CATH AND CORONARY ANGIOGRAPHY;  Surgeon: [name], MD;  Location: [place] [facility];  Service: Cardiovascular;  Laterality: N/A;   POLYPECTOMY  07/16/2021   Procedure: POLYPECTOMY;  Surgeon: Gatha Mayer, MD;  [facility]: [facility];  Service: Gastroenterology;;   RIGHT HEART CATH AND CORONARY ANGIOGRAPHY N/A 12/12/2021   Procedure: RIGHT HEART CATH AND CORONARY ANGIOGRAPHY;  Surgeon: Burnell Blanks, MD;  Location: [place] [facility];  Service: Cardiovascular;  Laterality: N/A;   WISDOM TOOTH EXTRACTION      Current Outpatient Medications  Medication Sig Dispense Refill   acetaminophen (TYLENOL) 500 MG tablet Take 500 mg by mouth every 6 (six) hours as needed for headache.  aspirin 81 MG chewable tablet Chew 1 tablet (81 mg total) by mouth daily. 90 tablet 3   atorvastatin (LIPITOR) 80 MG tablet Take 1 tablet (80 mg total) by mouth daily. 30 tablet 11   carvedilol (COREG) 12.5 MG tablet Take 1 tablet (12.5 mg total) by mouth 2 (two) times daily with a meal. 60 tablet 6   dapagliflozin propanediol (FARXIGA) 10 MG TABS tablet Take 1 tablet (10 mg total) by mouth daily before breakfast. 30 tablet 11   digoxin (LANOXIN) 0.125 MG tablet Take 1/2 tablet (0.0625 mg total) by mouth daily. 30 tablet 6   enoxaparin (LOVENOX) 80 MG/0.8ML  injection Inject 0.8 mLs (80 mg total) into the skin every 12 (twelve) hours. 8 mL 1   ezetimibe (ZETIA) 10 MG tablet Take 1 tablet (10 mg total) by mouth daily. 90 tablet 0   fenofibrate (TRICOR) 145 MG tablet Take 1 tablet (145 mg total) by mouth daily. 90 tablet 3   furosemide (LASIX) 20 MG tablet Take 1 tablet (20 mg total) by mouth as needed. (Patient taking differently: Take 20 mg by mouth daily as needed for edema.) 45 tablet 3   hydrOXYzine (ATARAX) 25 MG tablet Take 1 tablet (25 mg total) by mouth 3 (three) times daily as needed. 90 tablet 3   melatonin 5 MG TABS Take 10 mg by mouth at bedtime.     mirtazapine (REMERON) 45 MG tablet Take 1 tablet (45 mg total) by mouth at bedtime. 30 tablet 3   Multiple Vitamin (MULTIVITAMIN WITH MINERALS) TABS tablet Take 1 tablet by mouth daily.     omega-3 acid ethyl esters (LOVAZA) 1 g capsule TAKE 2 CAPSULES (2 GRAMS TOTAL) BY MOUTH TWO TIMES DAILY. 120 capsule 3   omeprazole (PRILOSEC) 20 MG capsule Take 20 mg by mouth daily before breakfast.     Probiotic Product (PROBIOTIC DAILY PO) Take 1 capsule by mouth with breakfast, with lunch, and with evening meal.     sacubitril-valsartan (ENTRESTO) 97-103 MG Take 1 tablet by mouth 2 (two) times daily. 60 tablet 11   sertraline (ZOLOFT) 100 MG tablet Take 1.5 tablets (150 mg total) by mouth daily. 45 tablet 3   SILDENAFIL CITRATE PO Take 45-90 mg by mouth daily as needed (ED). Take 1 - 2 tablets 30 to 45 min before sexual activity as needed     spironolactone (ALDACTONE) 25 MG tablet Take 1 tablet (25 mg total) by mouth daily. 30 tablet 11   varenicline (CHANTIX) 0.5 MG tablet Take 1 tablet (0.5 mg total) by mouth daily for 3 days, THEN 1 tablet (0.5 mg total) 2 (two) times daily for 4 days, THEN 2 tablets (1 mg total) 2 (two) times daily. 131 tablet 3   warfarin (COUMADIN) 5 MG tablet Take 1.5 tablets to 2 tablets by mouth once daily as directed by Anticoagulation clinic (Patient taking differently: Take  7.5-10 mg by mouth See admin instructions. Take 2 tablets (10 mg) by mouth on Sundays, Mondays, Wednesdays & Fridays in the evening. Take 1.5 tablet (7.5 mg) by mouth on Tuesdays, Thursdays & Saturdays in the evening.) 55 tablet 3   No current facility-administered medications for this visit.   Facility-Administered Medications Ordered in Other Visits  Medication Dose Route Frequency Provider Last Rate Last Admin   [START ON 02/19/2022] ceFAZolin (ANCEF) IVPB 2g/100 mL premix  2 g Intravenous To OR Coralie Common, MD       [START ON 02/19/2022] ceFAZolin (ANCEF) IVPB 2g/100 mL premix  2  g Intravenous To OR Coralie Common, MD       Derrill Memo ON 02/19/2022] dexmedetomidine (PRECEDEX) 400 MCG/100ML (4 mcg/mL) infusion  0.1-0.7 mcg/kg/hr Intravenous To OR Coralie Common, MD       Derrill Memo ON 02/19/2022] EPINEPHrine (ADRENALIN) 5 mg in NS 250 mL (0.02 mg/mL) premix infusion  0-10 mcg/min Intravenous To OR Coralie Common, MD       Derrill Memo ON 02/19/2022] heparin 30,000 units/NS 1000 mL solution for CELLSAVER   Other To OR Coralie Common, MD       Derrill Memo ON 02/19/2022] heparin sodium (porcine) 2,500 Units, papaverine 30 mg in electrolyte-A (PLASMALYTE-A PH 7.4) 500 mL irrigation   Irrigation To OR Coralie Common, MD       Derrill Memo ON 02/19/2022] insulin regular, human (MYXREDLIN) 100 units/ 100 mL infusion   Intravenous To OR Coralie Common, MD       Derrill Memo ON 02/19/2022] [place] [facility] Blood Cardioplegia vial (lidocaine/magnesium/mannitol 0.26g-4g-6.4g)   Intracoronary To OR Coralie Common, MD       [START ON 02/19/2022] [place] Blood Cardioplegia vial (lidocaine/magnesium/mannitol 0.26g-4g-6.4g)   Intracoronary To OR Coralie Common, MD       [START ON 02/19/2022] milrinone (PRIMACOR) 20 MG/100 ML (0.2 mg/mL) infusion  0.3 mcg/kg/min Intravenous To OR Coralie Common, MD       Derrill Memo ON 02/19/2022] nitroGLYCERIN 50 mg in dextrose 5 % 250 mL (0.2 mg/mL) infusion  2-200 mcg/min Intravenous To OR Coralie Common, MD       Derrill Memo ON  02/19/2022] norepinephrine (LEVOPHED) '4mg'$  in 236m (0.016 mg/mL) premix infusion  0-40 mcg/min Intravenous To OR WCoralie Common MD       [Derrill MemoON 02/19/2022] phenylephrine (NEO-SYNEPHRINE) '20mg'$ /NS 2519mpremix infusion  30-200 mcg/min Intravenous To OR WeCoralie CommonMD       [SDerrill MemoN 02/19/2022] potassium chloride injection 80 mEq  80 mEq Other To OR WeCoralie CommonMD       [SDerrill MemoN 02/19/2022] tranexamic acid (CYKLOKAPRON) 2,500 mg in sodium chloride 0.9 % 250 mL (10 mg/mL) infusion  1.5 mg/kg/hr Intravenous To OR WeCoralie CommonMD       [SDerrill MemoN 02/19/2022] tranexamic acid (CYKLOKAPRON) bolus via infusion - over 30 minutes 1,326 mg  15 mg/kg Intravenous To OR WeCoralie CommonMD       [SDerrill MemoN 02/19/2022] tranexamic acid (CYKLOKAPRON) pump prime solution 177 mg  2 mg/kg Intracatheter To OR WeCoralie CommonMD       [SDerrill MemoN 02/19/2022] vancomycin (VANCOCIN) 1,000 mg in sodium chloride 0.9 % 1,000 mL irrigation   Irrigation To OR WeCoralie CommonMD       [SDerrill MemoN 02/19/2022] vancomycin (VANCOREADY) IVPB 1500 mg/300 mL  1,500 mg Intravenous To OR WeCoralie CommonMD        Allergies:   Patient has no known allergies.      ROS:  Please see the history of present illness.   All other systems are personally reviewed and negative.    Exam:    Vital Signs:  Ht '5\' 9"'$  (1.753 m)   BMI 28.77 kg/m          Data Reviewed:      Other studies personally reviewed: Additional studies/ records that were reviewed today include: (As above)   Review of the above records today demonstrates: (As above)       Last device remote is reviewed from [facility] dated 1/24 which reveals normal device function,   arrhythmias - none     ASSESSMENT & PLAN:  Ischemic cardiomyopathy status post late presenting anterior wall MI   Congestive heart failure-chronic-systolic class IIb  ICD [employer]    Sinus "tachycardia "-relative   Hypotension   Mural thrombus        Follow-up:  6 mo Next  remote: As Scheduled   Current medicines are reviewed at length with the patient today.   The patient does not have concerns regarding his medicines.  The following changes were made today:  none  Labs/ tests ordered today include:   No orders of the defined types were placed in this encounter.   Future tests       Today, I have spent 14 minutes with the patient with telehealth technology discussing the above.  Signed, [name], MD  02/18/2022 3:37 PM     [place] [address] [phone] (office) [phone] (fax)

## 2022-02-19 ENCOUNTER — Inpatient Hospital Stay (HOSPITAL_COMMUNITY)
Admission: RE | Admit: 2022-02-19 | Discharge: 2022-02-24 | DRG: 220 | Disposition: A | Discharge status: Home / Self Care | Payer: Medicaid Other | Attending: Thoracic Surgery (Cardiothoracic Vascular Surgery) | Admitting: Thoracic Surgery (Cardiothoracic Vascular Surgery)

## 2022-02-19 ENCOUNTER — Inpatient Hospital Stay (HOSPITAL_COMMUNITY): Payer: Medicaid Other

## 2022-02-19 ENCOUNTER — Encounter (HOSPITAL_COMMUNITY): Payer: Self-pay | Admitting: Thoracic Surgery (Cardiothoracic Vascular Surgery)

## 2022-02-19 ENCOUNTER — Inpatient Hospital Stay (HOSPITAL_COMMUNITY): Payer: Medicaid Other | Admitting: Physician Assistant

## 2022-02-19 ENCOUNTER — Encounter (HOSPITAL_COMMUNITY)
Admission: RE | Disposition: A | Discharge status: Home / Self Care | Payer: Self-pay | Source: Home / Self Care | Attending: Thoracic Surgery (Cardiothoracic Vascular Surgery)

## 2022-02-19 ENCOUNTER — Other Ambulatory Visit: Payer: Self-pay

## 2022-02-19 DIAGNOSIS — E782 Mixed hyperlipidemia: Secondary | ICD-10-CM | POA: Diagnosis present

## 2022-02-19 DIAGNOSIS — Z7982 Long term (current) use of aspirin: Secondary | ICD-10-CM | POA: Diagnosis not present

## 2022-02-19 DIAGNOSIS — F17200 Nicotine dependence, unspecified, uncomplicated: Secondary | ICD-10-CM | POA: Diagnosis not present

## 2022-02-19 DIAGNOSIS — R739 Hyperglycemia, unspecified: Secondary | ICD-10-CM | POA: Diagnosis present

## 2022-02-19 DIAGNOSIS — Z8249 Family history of ischemic heart disease and other diseases of the circulatory system: Secondary | ICD-10-CM

## 2022-02-19 DIAGNOSIS — I255 Ischemic cardiomyopathy: Secondary | ICD-10-CM | POA: Diagnosis not present

## 2022-02-19 DIAGNOSIS — Z9911 Dependence on respirator [ventilator] status: Secondary | ICD-10-CM

## 2022-02-19 DIAGNOSIS — Z8042 Family history of malignant neoplasm of prostate: Secondary | ICD-10-CM

## 2022-02-19 DIAGNOSIS — F411 Generalized anxiety disorder: Secondary | ICD-10-CM | POA: Diagnosis present

## 2022-02-19 DIAGNOSIS — F32A Depression, unspecified: Secondary | ICD-10-CM | POA: Diagnosis present

## 2022-02-19 DIAGNOSIS — I2510 Atherosclerotic heart disease of native coronary artery without angina pectoris: Secondary | ICD-10-CM | POA: Diagnosis present

## 2022-02-19 DIAGNOSIS — R0902 Hypoxemia: Secondary | ICD-10-CM | POA: Diagnosis not present

## 2022-02-19 DIAGNOSIS — Z9889 Other specified postprocedural states: Principal | ICD-10-CM

## 2022-02-19 DIAGNOSIS — Z7901 Long term (current) use of anticoagulants: Secondary | ICD-10-CM | POA: Diagnosis not present

## 2022-02-19 DIAGNOSIS — I350 Nonrheumatic aortic (valve) stenosis: Secondary | ICD-10-CM | POA: Diagnosis not present

## 2022-02-19 DIAGNOSIS — I5042 Chronic combined systolic (congestive) and diastolic (congestive) heart failure: Secondary | ICD-10-CM | POA: Diagnosis present

## 2022-02-19 DIAGNOSIS — F17210 Nicotine dependence, cigarettes, uncomplicated: Secondary | ICD-10-CM | POA: Diagnosis not present

## 2022-02-19 DIAGNOSIS — Z79899 Other long term (current) drug therapy: Secondary | ICD-10-CM | POA: Diagnosis not present

## 2022-02-19 DIAGNOSIS — I252 Old myocardial infarction: Secondary | ICD-10-CM

## 2022-02-19 DIAGNOSIS — I5022 Chronic systolic (congestive) heart failure: Secondary | ICD-10-CM | POA: Diagnosis not present

## 2022-02-19 DIAGNOSIS — D62 Acute posthemorrhagic anemia: Secondary | ICD-10-CM | POA: Diagnosis not present

## 2022-02-19 DIAGNOSIS — Z800 Family history of malignant neoplasm of digestive organs: Secondary | ICD-10-CM

## 2022-02-19 DIAGNOSIS — I509 Heart failure, unspecified: Secondary | ICD-10-CM | POA: Diagnosis not present

## 2022-02-19 DIAGNOSIS — Z833 Family history of diabetes mellitus: Secondary | ICD-10-CM | POA: Diagnosis not present

## 2022-02-19 DIAGNOSIS — Z95810 Presence of automatic (implantable) cardiac defibrillator: Secondary | ICD-10-CM | POA: Diagnosis not present

## 2022-02-19 DIAGNOSIS — K219 Gastro-esophageal reflux disease without esophagitis: Secondary | ICD-10-CM | POA: Diagnosis present

## 2022-02-19 DIAGNOSIS — Q231 Congenital insufficiency of aortic valve: Secondary | ICD-10-CM | POA: Diagnosis not present

## 2022-02-19 DIAGNOSIS — I513 Intracardiac thrombosis, not elsewhere classified: Secondary | ICD-10-CM

## 2022-02-19 DIAGNOSIS — J9601 Acute respiratory failure with hypoxia: Secondary | ICD-10-CM

## 2022-02-19 DIAGNOSIS — J90 Pleural effusion, not elsewhere classified: Secondary | ICD-10-CM | POA: Diagnosis not present

## 2022-02-19 DIAGNOSIS — I088 Other rheumatic multiple valve diseases: Secondary | ICD-10-CM | POA: Diagnosis not present

## 2022-02-19 DIAGNOSIS — J9811 Atelectasis: Secondary | ICD-10-CM | POA: Diagnosis not present

## 2022-02-19 HISTORY — PX: AORTIC VALVE REPLACEMENT: SHX41

## 2022-02-19 HISTORY — PX: TEE WITHOUT CARDIOVERSION: SHX5443

## 2022-02-19 LAB — POCT I-STAT 7, (LYTES, BLD GAS, ICA,H+H)
Acid-base deficit: 3 mmol/L — ABNORMAL HIGH (ref 0.0–2.0)
Acid-base deficit: 2 mmol/L (ref 0.0–2.0)
Acid-base deficit: 2 mmol/L (ref 0.0–2.0)
Acid-base deficit: 3 mmol/L — ABNORMAL HIGH (ref 0.0–2.0)
Acid-base deficit: 4 mmol/L — ABNORMAL HIGH (ref 0.0–2.0)
Acid-base deficit: 4 mmol/L — ABNORMAL HIGH (ref 0.0–2.0)
Acid-base deficit: 4 mmol/L — ABNORMAL HIGH (ref 0.0–2.0)
Acid-base deficit: 4 mmol/L — ABNORMAL HIGH (ref 0.0–2.0)
Bicarbonate: 23.1 mmol/L (ref 20.0–28.0)
Bicarbonate: 24 mmol/L (ref 20.0–28.0)
Bicarbonate: 21.4 mmol/L (ref 20.0–28.0)
Bicarbonate: 24.2 mmol/L (ref 20.0–28.0)
Bicarbonate: 24.8 mmol/L (ref 20.0–28.0)
Bicarbonate: 23.1 mmol/L (ref 20.0–28.0)
Bicarbonate: 22.8 mmol/L (ref 20.0–28.0)
Bicarbonate: 22.8 mmol/L (ref 20.0–28.0)
Calcium, Ion: 1.28 mmol/L (ref 1.15–1.40)
Calcium, Ion: 1.04 mmol/L — ABNORMAL LOW (ref 1.15–1.40)
Calcium, Ion: 1.12 mmol/L — ABNORMAL LOW (ref 1.15–1.40)
Calcium, Ion: 1.16 mmol/L (ref 1.15–1.40)
Calcium, Ion: 1.19 mmol/L (ref 1.15–1.40)
Calcium, Ion: 1.14 mmol/L — ABNORMAL LOW (ref 1.15–1.40)
Calcium, Ion: 1.18 mmol/L (ref 1.15–1.40)
Calcium, Ion: 1.18 mmol/L (ref 1.15–1.40)
HCT: 35 % — ABNORMAL LOW (ref 39.0–52.0)
HCT: 26 % — ABNORMAL LOW (ref 39.0–52.0)
HCT: 27 % — ABNORMAL LOW (ref 39.0–52.0)
HCT: 27 % — ABNORMAL LOW (ref 39.0–52.0)
HCT: 28 % — ABNORMAL LOW (ref 39.0–52.0)
HCT: 30 % — ABNORMAL LOW (ref 39.0–52.0)
HCT: 29 % — ABNORMAL LOW (ref 39.0–52.0)
HCT: 28 % — ABNORMAL LOW (ref 39.0–52.0)
Hemoglobin: 11.9 g/dL — ABNORMAL LOW (ref 13.0–17.0)
Hemoglobin: 8.8 g/dL — ABNORMAL LOW (ref 13.0–17.0)
Hemoglobin: 9.2 g/dL — ABNORMAL LOW (ref 13.0–17.0)
Hemoglobin: 9.2 g/dL — ABNORMAL LOW (ref 13.0–17.0)
Hemoglobin: 9.5 g/dL — ABNORMAL LOW (ref 13.0–17.0)
Hemoglobin: 10.2 g/dL — ABNORMAL LOW (ref 13.0–17.0)
Hemoglobin: 9.9 g/dL — ABNORMAL LOW (ref 13.0–17.0)
Hemoglobin: 9.5 g/dL — ABNORMAL LOW (ref 13.0–17.0)
O2 Saturation: 100 %
O2 Saturation: 100 %
O2 Saturation: 100 %
O2 Saturation: 100 %
O2 Saturation: 99 %
O2 Saturation: 95 %
O2 Saturation: 97 %
O2 Saturation: 95 %
Patient temperature: 36
Patient temperature: 36.8
Patient temperature: 37
Potassium: 4.4 mmol/L (ref 3.5–5.1)
Potassium: 4.7 mmol/L (ref 3.5–5.1)
Potassium: 5 mmol/L (ref 3.5–5.1)
Potassium: 5.3 mmol/L — ABNORMAL HIGH (ref 3.5–5.1)
Potassium: 5.5 mmol/L — ABNORMAL HIGH (ref 3.5–5.1)
Potassium: 4.3 mmol/L (ref 3.5–5.1)
Potassium: 4.3 mmol/L (ref 3.5–5.1)
Potassium: 4.4 mmol/L (ref 3.5–5.1)
Sodium: 138 mmol/L (ref 135–145)
Sodium: 139 mmol/L (ref 135–145)
Sodium: 137 mmol/L (ref 135–145)
Sodium: 138 mmol/L (ref 135–145)
Sodium: 138 mmol/L (ref 135–145)
Sodium: 141 mmol/L (ref 135–145)
Sodium: 140 mmol/L (ref 135–145)
Sodium: 140 mmol/L (ref 135–145)
TCO2: 24 mmol/L (ref 22–32)
TCO2: 25 mmol/L (ref 22–32)
TCO2: 23 mmol/L (ref 22–32)
TCO2: 26 mmol/L (ref 22–32)
TCO2: 26 mmol/L (ref 22–32)
TCO2: 25 mmol/L (ref 22–32)
TCO2: 24 mmol/L (ref 22–32)
TCO2: 24 mmol/L (ref 22–32)
pCO2 arterial: 44.1 mmHg (ref 32–48)
pCO2 arterial: 45.6 mmHg (ref 32–48)
pCO2 arterial: 38.4 mmHg (ref 32–48)
pCO2 arterial: 47.8 mmHg (ref 32–48)
pCO2 arterial: 55.3 mmHg — ABNORMAL HIGH (ref 32–48)
pCO2 arterial: 47.5 mmHg (ref 32–48)
pCO2 arterial: 46.7 mmHg (ref 32–48)
pCO2 arterial: 45.9 mmHg (ref 32–48)
pH, Arterial: 7.327 — ABNORMAL LOW (ref 7.35–7.45)
pH, Arterial: 7.328 — ABNORMAL LOW (ref 7.35–7.45)
pH, Arterial: 7.259 — ABNORMAL LOW (ref 7.35–7.45)
pH, Arterial: 7.312 — ABNORMAL LOW (ref 7.35–7.45)
pH, Arterial: 7.353 (ref 7.35–7.45)
pH, Arterial: 7.29 — ABNORMAL LOW (ref 7.35–7.45)
pH, Arterial: 7.295 — ABNORMAL LOW (ref 7.35–7.45)
pH, Arterial: 7.304 — ABNORMAL LOW (ref 7.35–7.45)
pO2, Arterial: 221 mmHg — ABNORMAL HIGH (ref 83–108)
pO2, Arterial: 277 mmHg — ABNORMAL HIGH (ref 83–108)
pO2, Arterial: 131 mmHg — ABNORMAL HIGH (ref 83–108)
pO2, Arterial: 265 mmHg — ABNORMAL HIGH (ref 83–108)
pO2, Arterial: 300 mmHg — ABNORMAL HIGH (ref 83–108)
pO2, Arterial: 82 mmHg — ABNORMAL LOW (ref 83–108)
pO2, Arterial: 95 mmHg (ref 83–108)
pO2, Arterial: 86 mmHg (ref 83–108)

## 2022-02-19 LAB — ECHO INTRAOPERATIVE TEE
AR max vel: 2.93 cm2
AV Area VTI: 2.93 cm2
AV Area mean vel: 2.86 cm2
AV Mean grad: 10.5 mmHg
AV Peak grad: 17.8 mmHg
Ao pk vel: 2.11 m/s
Height: 69 in
Weight: 2976 oz

## 2022-02-19 LAB — POCT I-STAT, CHEM 8
BUN: 25 mg/dL — ABNORMAL HIGH (ref 6–20)
BUN: 24 mg/dL — ABNORMAL HIGH (ref 6–20)
BUN: 24 mg/dL — ABNORMAL HIGH (ref 6–20)
BUN: 24 mg/dL — ABNORMAL HIGH (ref 6–20)
Calcium, Ion: 1.18 mmol/L (ref 1.15–1.40)
Calcium, Ion: 1.19 mmol/L (ref 1.15–1.40)
Calcium, Ion: 1.29 mmol/L (ref 1.15–1.40)
Calcium, Ion: 1.06 mmol/L — ABNORMAL LOW (ref 1.15–1.40)
Chloride: 104 mmol/L (ref 98–111)
Chloride: 103 mmol/L (ref 98–111)
Chloride: 104 mmol/L (ref 98–111)
Chloride: 102 mmol/L (ref 98–111)
Creatinine, Ser: 1.2 mg/dL (ref 0.61–1.24)
Creatinine, Ser: 1.1 mg/dL (ref 0.61–1.24)
Creatinine, Ser: 1.3 mg/dL — ABNORMAL HIGH (ref 0.61–1.24)
Creatinine, Ser: 1.1 mg/dL (ref 0.61–1.24)
Glucose, Bld: 124 mg/dL — ABNORMAL HIGH (ref 70–99)
Glucose, Bld: 103 mg/dL — ABNORMAL HIGH (ref 70–99)
Glucose, Bld: 115 mg/dL — ABNORMAL HIGH (ref 70–99)
Glucose, Bld: 141 mg/dL — ABNORMAL HIGH (ref 70–99)
HCT: 36 % — ABNORMAL LOW (ref 39.0–52.0)
HCT: 28 % — ABNORMAL LOW (ref 39.0–52.0)
HCT: 33 % — ABNORMAL LOW (ref 39.0–52.0)
HCT: 30 % — ABNORMAL LOW (ref 39.0–52.0)
Hemoglobin: 12.2 g/dL — ABNORMAL LOW (ref 13.0–17.0)
Hemoglobin: 11.2 g/dL — ABNORMAL LOW (ref 13.0–17.0)
Hemoglobin: 9.5 g/dL — ABNORMAL LOW (ref 13.0–17.0)
Hemoglobin: 10.2 g/dL — ABNORMAL LOW (ref 13.0–17.0)
Potassium: 4.4 mmol/L (ref 3.5–5.1)
Potassium: 4.5 mmol/L (ref 3.5–5.1)
Potassium: 5.3 mmol/L — ABNORMAL HIGH (ref 3.5–5.1)
Potassium: 5.1 mmol/L (ref 3.5–5.1)
Sodium: 139 mmol/L (ref 135–145)
Sodium: 138 mmol/L (ref 135–145)
Sodium: 138 mmol/L (ref 135–145)
Sodium: 138 mmol/L (ref 135–145)
TCO2: 24 mmol/L (ref 22–32)
TCO2: 26 mmol/L (ref 22–32)
TCO2: 27 mmol/L (ref 22–32)
TCO2: 24 mmol/L (ref 22–32)

## 2022-02-19 LAB — CBC
HCT: 30.5 % — ABNORMAL LOW (ref 39.0–52.0)
HCT: 30.5 % — ABNORMAL LOW (ref 39.0–52.0)
Hemoglobin: 10.1 g/dL — ABNORMAL LOW (ref 13.0–17.0)
Hemoglobin: 10.5 g/dL — ABNORMAL LOW (ref 13.0–17.0)
MCH: 31.4 pg (ref 26.0–34.0)
MCH: 31.6 pg (ref 26.0–34.0)
MCHC: 33.1 g/dL (ref 30.0–36.0)
MCHC: 34.4 g/dL (ref 30.0–36.0)
MCV: 94.7 fL (ref 80.0–100.0)
MCV: 91.9 fL (ref 80.0–100.0)
Platelets: 186 10*3/uL (ref 150–400)
Platelets: 163 10*3/uL (ref 150–400)
RBC: 3.22 MIL/uL — ABNORMAL LOW (ref 4.22–5.81)
RBC: 3.32 MIL/uL — ABNORMAL LOW (ref 4.22–5.81)
RDW: 13.2 % (ref 11.5–15.5)
RDW: 13.3 % (ref 11.5–15.5)
WBC: 13.6 10*3/uL — ABNORMAL HIGH (ref 4.0–10.5)
WBC: 9.4 10*3/uL (ref 4.0–10.5)
nRBC: 0 % (ref 0.0–0.2)
nRBC: 0 % (ref 0.0–0.2)

## 2022-02-19 LAB — POCT I-STAT EG7
Acid-base deficit: 2 mmol/L (ref 0.0–2.0)
Bicarbonate: 24.8 mmol/L (ref 20.0–28.0)
Calcium, Ion: 1.15 mmol/L (ref 1.15–1.40)
HCT: 27 % — ABNORMAL LOW (ref 39.0–52.0)
Hemoglobin: 9.2 g/dL — ABNORMAL LOW (ref 13.0–17.0)
O2 Saturation: 82 %
Potassium: 5.1 mmol/L (ref 3.5–5.1)
Sodium: 138 mmol/L (ref 135–145)
TCO2: 26 mmol/L (ref 22–32)
pCO2, Ven: 53.6 mmHg (ref 44–60)
pH, Ven: 7.273 (ref 7.25–7.43)
pO2, Ven: 53 mmHg — ABNORMAL HIGH (ref 32–45)

## 2022-02-19 LAB — BASIC METABOLIC PANEL
Anion gap: 6 (ref 5–15)
BUN: 17 mg/dL (ref 6–20)
CO2: 23 mmol/L (ref 22–32)
Calcium: 7.8 mg/dL — ABNORMAL LOW (ref 8.9–10.3)
Chloride: 105 mmol/L (ref 98–111)
Creatinine, Ser: 1.01 mg/dL (ref 0.61–1.24)
GFR, Estimated: >60 mL/min (ref >60)
Glucose, Bld: 107 mg/dL — ABNORMAL HIGH (ref 70–99)
Potassium: 4.6 mmol/L (ref 3.5–5.1)
Sodium: 134 mmol/L — ABNORMAL LOW (ref 135–145)

## 2022-02-19 LAB — GLUCOSE, CAPILLARY
Glucose-Capillary: 132 mg/dL — ABNORMAL HIGH (ref 70–99)
Glucose-Capillary: 148 mg/dL — ABNORMAL HIGH (ref 70–99)
Glucose-Capillary: 129 mg/dL — ABNORMAL HIGH (ref 70–99)
Glucose-Capillary: 129 mg/dL — ABNORMAL HIGH (ref 70–99)
Glucose-Capillary: 111 mg/dL — ABNORMAL HIGH (ref 70–99)
Glucose-Capillary: 112 mg/dL — ABNORMAL HIGH (ref 70–99)
Glucose-Capillary: 117 mg/dL — ABNORMAL HIGH (ref 70–99)
Glucose-Capillary: 133 mg/dL — ABNORMAL HIGH (ref 70–99)

## 2022-02-19 LAB — MAGNESIUM: Magnesium: 3.1 mg/dL — ABNORMAL HIGH (ref 1.7–2.4)

## 2022-02-19 LAB — PROTIME-INR
INR: 1 (ref 0.8–1.2)
INR: 1.4 — ABNORMAL HIGH (ref 0.8–1.2)
Prothrombin Time: 13.2 seconds (ref 11.4–15.2)
Prothrombin Time: 16.6 seconds — ABNORMAL HIGH (ref 11.4–15.2)

## 2022-02-19 LAB — HEMOGLOBIN AND HEMATOCRIT, BLOOD
HCT: 29 % — ABNORMAL LOW (ref 39.0–52.0)
Hemoglobin: 10 g/dL — ABNORMAL LOW (ref 13.0–17.0)

## 2022-02-19 LAB — ABO/RH: ABO/RH(D): A POS

## 2022-02-19 LAB — PLATELET COUNT: Platelets: 196 10*3/uL (ref 150–400)

## 2022-02-19 LAB — APTT: aPTT: 28 seconds (ref 24–36)

## 2022-02-19 SURGERY — REPLACEMENT, AORTIC VALVE, OPEN
Anesthesia: General | Site: Chest

## 2022-02-19 MED ORDER — FENTANYL CITRATE (PF) 250 MCG/5ML IJ SOLN
INTRAMUSCULAR | Status: AC
  Filled 2022-02-19: qty 5

## 2022-02-19 MED ORDER — MAGNESIUM SULFATE 4 GM/100ML IV SOLN
4.0000 g | Freq: Once | INTRAVENOUS | Status: AC
  Administered 2022-02-19: 4 g via INTRAVENOUS
  Filled 2022-02-19: qty 100

## 2022-02-19 MED ORDER — BISACODYL 5 MG PO TBEC
10.0000 mg | DELAYED_RELEASE_TABLET | Freq: Every day | ORAL | Status: DC
  Administered 2022-02-20 – 2022-02-22 (×2): 10 mg via ORAL
  Filled 2022-02-19 (×3): qty 2

## 2022-02-19 MED ORDER — TRAMADOL HCL 50 MG PO TABS
50.0000 mg | ORAL_TABLET | ORAL | Status: DC | PRN
  Administered 2022-02-19: 100 mg via ORAL
  Administered 2022-02-20: 50 mg via ORAL
  Administered 2022-02-20: 100 mg via ORAL
  Administered 2022-02-20: 50 mg via ORAL
  Administered 2022-02-23: 100 mg via ORAL
  Filled 2022-02-19: qty 1
  Filled 2022-02-19 (×3): qty 2
  Filled 2022-02-19: qty 1

## 2022-02-19 MED ORDER — ONDANSETRON HCL 4 MG/2ML IJ SOLN: 4.0000 mg | Freq: Four times a day (QID) | INTRAMUSCULAR | Status: DC | PRN

## 2022-02-19 MED ORDER — INSULIN ASPART 100 UNIT/ML IJ SOLN
2.0000 [IU] | INTRAMUSCULAR | Status: DC
  Administered 2022-02-19 – 2022-02-20 (×2): 2 [IU] via SUBCUTANEOUS

## 2022-02-19 MED ORDER — PROTAMINE SULFATE 10 MG/ML IV SOLN
INTRAVENOUS | Status: AC
  Filled 2022-02-19: qty 20

## 2022-02-19 MED ORDER — OXYCODONE HCL 5 MG PO TABS
5.0000 mg | ORAL_TABLET | ORAL | Status: DC | PRN
  Administered 2022-02-19 (×2): 10 mg via ORAL
  Administered 2022-02-20: 5 mg via ORAL
  Administered 2022-02-20 (×3): 10 mg via ORAL
  Administered 2022-02-21 (×2): 5 mg via ORAL
  Administered 2022-02-21 – 2022-02-22 (×2): 10 mg via ORAL
  Administered 2022-02-22 (×2): 5 mg via ORAL
  Administered 2022-02-23 – 2022-02-24 (×4): 10 mg via ORAL
  Filled 2022-02-19: qty 1
  Filled 2022-02-19 (×3): qty 2
  Filled 2022-02-19: qty 1
  Filled 2022-02-19: qty 2
  Filled 2022-02-19: qty 1
  Filled 2022-02-19 (×4): qty 2
  Filled 2022-02-19: qty 1
  Filled 2022-02-19: qty 2
  Filled 2022-02-19: qty 1
  Filled 2022-02-19 (×2): qty 2

## 2022-02-19 MED ORDER — ACETAMINOPHEN 500 MG PO TABS
1000.0000 mg | ORAL_TABLET | Freq: Once | ORAL | Status: AC
  Administered 2022-02-19: 1000 mg via ORAL
  Filled 2022-02-19: qty 2

## 2022-02-19 MED ORDER — CHLORHEXIDINE GLUCONATE 0.12 % MT SOLN
OROMUCOSAL | Status: AC
  Administered 2022-02-19: 15 mL
  Filled 2022-02-19: qty 15

## 2022-02-19 MED ORDER — METOPROLOL TARTRATE 12.5 MG HALF TABLET
12.5000 mg | ORAL_TABLET | Freq: Two times a day (BID) | ORAL | Status: DC
  Administered 2022-02-21 – 2022-02-23 (×5): 12.5 mg via ORAL
  Filled 2022-02-19 (×5): qty 1

## 2022-02-19 MED ORDER — CEFAZOLIN SODIUM-DEXTROSE 2-4 GM/100ML-% IV SOLN
2.0000 g | Freq: Three times a day (TID) | INTRAVENOUS | Status: AC
  Administered 2022-02-19 – 2022-02-21 (×6): 2 g via INTRAVENOUS
  Filled 2022-02-19 (×6): qty 100

## 2022-02-19 MED ORDER — ASPIRIN 81 MG PO CHEW: 324.0000 mg | CHEWABLE_TABLET | Freq: Every day | ORAL | Status: DC

## 2022-02-19 MED ORDER — FENOFIBRATE 160 MG PO TABS
160.0000 mg | ORAL_TABLET | Freq: Every day | ORAL | Status: DC
  Administered 2022-02-20 – 2022-02-24 (×5): 160 mg via ORAL
  Filled 2022-02-19 (×6): qty 1

## 2022-02-19 MED ORDER — ETOMIDATE 2 MG/ML IV SOLN
INTRAVENOUS | Status: AC
  Filled 2022-02-19: qty 10

## 2022-02-19 MED ORDER — OMEGA-3-ACID ETHYL ESTERS 1 G PO CAPS
2.0000 g | ORAL_CAPSULE | Freq: Two times a day (BID) | ORAL | Status: DC
  Administered 2022-02-20 – 2022-02-24 (×9): 2 g via ORAL
  Filled 2022-02-19 (×10): qty 2

## 2022-02-19 MED ORDER — CHLORHEXIDINE GLUCONATE CLOTH 2 % EX PADS
6.0000 | MEDICATED_PAD | Freq: Every day | CUTANEOUS | Status: DC
  Administered 2022-02-19 – 2022-02-21 (×3): 6 via TOPICAL

## 2022-02-19 MED ORDER — ACETAMINOPHEN 160 MG/5ML PO SOLN: 1000.0000 mg | Freq: Four times a day (QID) | ORAL | Status: DC

## 2022-02-19 MED ORDER — MILRINONE LACTATE IN DEXTROSE 20-5 MG/100ML-% IV SOLN
0.3000 ug/kg/min | INTRAVENOUS | Status: DC
  Administered 2022-02-19: 0.3 ug/kg/min via INTRAVENOUS

## 2022-02-19 MED ORDER — LACTATED RINGERS IV SOLN: INTRAVENOUS | Status: DC

## 2022-02-19 MED ORDER — ACETAMINOPHEN 500 MG PO TABS
1000.0000 mg | ORAL_TABLET | Freq: Four times a day (QID) | ORAL | Status: DC
  Administered 2022-02-19 – 2022-02-24 (×19): 1000 mg via ORAL
  Filled 2022-02-19 (×20): qty 2

## 2022-02-19 MED ORDER — NITROGLYCERIN IN D5W 200-5 MCG/ML-% IV SOLN: 0.0000 ug/min | INTRAVENOUS | Status: DC

## 2022-02-19 MED ORDER — ACETAMINOPHEN 160 MG/5ML PO SOLN: 650.0000 mg | Freq: Once | ORAL | Status: DC

## 2022-02-19 MED ORDER — ~~LOC~~ CARDIAC SURGERY, PATIENT & FAMILY EDUCATION
Freq: Once | Status: DC
  Filled 2022-02-19: qty 1

## 2022-02-19 MED ORDER — MIDAZOLAM HCL (PF) 10 MG/2ML IJ SOLN
INTRAMUSCULAR | Status: AC
  Filled 2022-02-19: qty 2

## 2022-02-19 MED ORDER — METOPROLOL TARTRATE 25 MG/10 ML ORAL SUSPENSION
12.5000 mg | Freq: Two times a day (BID) | ORAL | Status: DC
  Filled 2022-02-19 (×4): qty 5

## 2022-02-19 MED ORDER — PROTAMINE SULFATE 10 MG/ML IV SOLN
INTRAVENOUS | Status: DC | PRN
  Administered 2022-02-19: 300 mg via INTRAVENOUS

## 2022-02-19 MED ORDER — 0.9 % SODIUM CHLORIDE (POUR BTL) OPTIME
TOPICAL | Status: DC | PRN
  Administered 2022-02-19: 4000 mL

## 2022-02-19 MED ORDER — CHLORHEXIDINE GLUCONATE 0.12 % MT SOLN: 15.0000 mL | Freq: Once | OROMUCOSAL | Status: DC

## 2022-02-19 MED ORDER — ASPIRIN 325 MG PO TBEC
325.0000 mg | DELAYED_RELEASE_TABLET | Freq: Every day | ORAL | Status: DC
  Administered 2022-02-20: 325 mg via ORAL
  Filled 2022-02-19: qty 1

## 2022-02-19 MED ORDER — ROCURONIUM BROMIDE 10 MG/ML (PF) SYRINGE
PREFILLED_SYRINGE | INTRAVENOUS | Status: DC | PRN
  Administered 2022-02-19 (×2): 50 mg via INTRAVENOUS
  Administered 2022-02-19: 30 mg via INTRAVENOUS

## 2022-02-19 MED ORDER — VANCOMYCIN HCL IN DEXTROSE 1-5 GM/200ML-% IV SOLN
1000.0000 mg | Freq: Once | INTRAVENOUS | Status: AC
  Administered 2022-02-19: 1000 mg via INTRAVENOUS
  Filled 2022-02-19: qty 200

## 2022-02-19 MED ORDER — EPINEPHRINE HCL 5 MG/250ML IV SOLN IN NS
0.0000 ug/min | INTRAVENOUS | Status: DC
  Administered 2022-02-19: 5 ug/min via INTRAVENOUS

## 2022-02-19 MED ORDER — MIDAZOLAM HCL 2 MG/2ML IJ SOLN: 2.0000 mg | INTRAMUSCULAR | Status: DC | PRN

## 2022-02-19 MED ORDER — ARTIFICIAL TEARS OPHTHALMIC OINT
TOPICAL_OINTMENT | OPHTHALMIC | Status: DC | PRN
  Administered 2022-02-19: 1 via OPHTHALMIC

## 2022-02-19 MED ORDER — MILRINONE LACTATE IN DEXTROSE 20-5 MG/100ML-% IV SOLN: 0.3000 ug/kg/min | INTRAVENOUS | Status: DC

## 2022-02-19 MED ORDER — ACETAMINOPHEN 650 MG RE SUPP: 650.0000 mg | Freq: Once | RECTAL | Status: DC

## 2022-02-19 MED ORDER — DEXTROSE 50 % IV SOLN: 0.0000 mL | INTRAVENOUS | Status: DC | PRN

## 2022-02-19 MED ORDER — STERILE WATER FOR IRRIGATION IR SOLN
Status: DC | PRN
  Administered 2022-02-19: 2000 mL

## 2022-02-19 MED ORDER — ORAL CARE MOUTH RINSE: 15.0000 mL | OROMUCOSAL | Status: DC | PRN

## 2022-02-19 MED ORDER — ALBUMIN HUMAN 5 % IV SOLN
250.0000 mL | INTRAVENOUS | Status: AC | PRN
  Administered 2022-02-19 (×2): 12.5 g via INTRAVENOUS

## 2022-02-19 MED ORDER — SODIUM CHLORIDE (PF) 0.9 % IJ SOLN
INTRAMUSCULAR | Status: AC
  Filled 2022-02-19: qty 10

## 2022-02-19 MED ORDER — ALBUMIN HUMAN 5 % IV SOLN: INTRAVENOUS | Status: DC | PRN

## 2022-02-19 MED ORDER — PHENYLEPHRINE HCL-NACL 20-0.9 MG/250ML-% IV SOLN
0.0000 ug/min | INTRAVENOUS | Status: DC
  Administered 2022-02-19: 40 ug/min via INTRAVENOUS

## 2022-02-19 MED ORDER — ROCURONIUM BROMIDE 10 MG/ML (PF) SYRINGE
PREFILLED_SYRINGE | INTRAVENOUS | Status: AC
  Filled 2022-02-19: qty 20

## 2022-02-19 MED ORDER — SODIUM CHLORIDE 0.45 % IV SOLN: INTRAVENOUS | Status: DC | PRN

## 2022-02-19 MED ORDER — FENTANYL CITRATE (PF) 250 MCG/5ML IJ SOLN
INTRAMUSCULAR | Status: DC | PRN
  Administered 2022-02-19: 50 ug via INTRAVENOUS
  Administered 2022-02-19: 100 ug via INTRAVENOUS
  Administered 2022-02-19: 50 ug via INTRAVENOUS
  Administered 2022-02-19: 750 ug via INTRAVENOUS
  Administered 2022-02-19: 100 ug via INTRAVENOUS

## 2022-02-19 MED ORDER — BISACODYL 10 MG RE SUPP: 10.0000 mg | Freq: Every day | RECTAL | Status: DC

## 2022-02-19 MED ORDER — CHLORHEXIDINE GLUCONATE 4 % EX LIQD: 30.0000 mL | CUTANEOUS | Status: DC

## 2022-02-19 MED ORDER — SODIUM CHLORIDE 0.9 % IV SOLN: 250.0000 mL | INTRAVENOUS | Status: DC

## 2022-02-19 MED ORDER — HEPARIN SODIUM (PORCINE) 1000 UNIT/ML IJ SOLN
INTRAMUSCULAR | Status: AC
  Filled 2022-02-19: qty 30

## 2022-02-19 MED ORDER — DOCUSATE SODIUM 100 MG PO CAPS
200.0000 mg | ORAL_CAPSULE | Freq: Every day | ORAL | Status: DC
  Administered 2022-02-20 – 2022-02-24 (×4): 200 mg via ORAL
  Filled 2022-02-19 (×4): qty 2

## 2022-02-19 MED ORDER — EZETIMIBE 10 MG PO TABS
10.0000 mg | ORAL_TABLET | Freq: Every day | ORAL | Status: DC
  Administered 2022-02-20 – 2022-02-24 (×5): 10 mg via ORAL
  Filled 2022-02-19 (×5): qty 1

## 2022-02-19 MED ORDER — SODIUM CHLORIDE 0.9% FLUSH
3.0000 mL | Freq: Two times a day (BID) | INTRAVENOUS | Status: DC
  Administered 2022-02-20 – 2022-02-23 (×8): 3 mL via INTRAVENOUS

## 2022-02-19 MED ORDER — POTASSIUM CHLORIDE 10 MEQ/50ML IV SOLN: 10.0000 meq | INTRAVENOUS | Status: AC

## 2022-02-19 MED ORDER — ETOMIDATE 2 MG/ML IV SOLN
INTRAVENOUS | Status: DC | PRN
  Administered 2022-02-19: 12 mg via INTRAVENOUS

## 2022-02-19 MED ORDER — CHLORHEXIDINE GLUCONATE 0.12 % MT SOLN
15.0000 mL | OROMUCOSAL | Status: AC
  Administered 2022-02-19: 15 mL via OROMUCOSAL
  Filled 2022-02-19: qty 15

## 2022-02-19 MED ORDER — VANCOMYCIN HCL 1000 MG IV SOLR
INTRAVENOUS | Status: DC | PRN
  Administered 2022-02-19: 1000 mL

## 2022-02-19 MED ORDER — ATORVASTATIN CALCIUM 80 MG PO TABS
80.0000 mg | ORAL_TABLET | Freq: Every day | ORAL | Status: DC
  Administered 2022-02-20 – 2022-02-24 (×5): 80 mg via ORAL
  Filled 2022-02-19 (×5): qty 1

## 2022-02-19 MED ORDER — PLASMA-LYTE A IV SOLN
INTRAVENOUS | Status: DC | PRN
  Administered 2022-02-19: 500 mL

## 2022-02-19 MED ORDER — PANTOPRAZOLE SODIUM 40 MG PO TBEC
40.0000 mg | DELAYED_RELEASE_TABLET | Freq: Every day | ORAL | Status: DC
  Administered 2022-02-21 – 2022-02-24 (×4): 40 mg via ORAL
  Filled 2022-02-19 (×4): qty 1

## 2022-02-19 MED ORDER — PROPOFOL 10 MG/ML IV BOLUS
INTRAVENOUS | Status: AC
  Filled 2022-02-19: qty 20

## 2022-02-19 MED ORDER — FAMOTIDINE IN NACL 20-0.9 MG/50ML-% IV SOLN
20.0000 mg | Freq: Two times a day (BID) | INTRAVENOUS | Status: AC
  Administered 2022-02-19: 20 mg via INTRAVENOUS

## 2022-02-19 MED ORDER — MIDAZOLAM HCL (PF) 5 MG/ML IJ SOLN
INTRAMUSCULAR | Status: DC | PRN
  Administered 2022-02-19: 2 mg via INTRAVENOUS
  Administered 2022-02-19 (×2): 1 mg via INTRAVENOUS
  Administered 2022-02-19: 2 mg via INTRAVENOUS
  Administered 2022-02-19: 3 mg via INTRAVENOUS
  Administered 2022-02-19: 1 mg via INTRAVENOUS

## 2022-02-19 MED ORDER — SODIUM CHLORIDE 0.9 % IV SOLN: INTRAVENOUS | Status: DC

## 2022-02-19 MED ORDER — METOPROLOL TARTRATE 5 MG/5ML IV SOLN: 2.5000 mg | INTRAVENOUS | Status: DC | PRN

## 2022-02-19 MED ORDER — HEPARIN SODIUM (PORCINE) 1000 UNIT/ML IJ SOLN
INTRAMUSCULAR | Status: DC | PRN
  Administered 2022-02-19: 30000 [IU] via INTRAVENOUS

## 2022-02-19 MED ORDER — SODIUM CHLORIDE 0.9% FLUSH: 3.0000 mL | INTRAVENOUS | Status: DC | PRN

## 2022-02-19 MED ORDER — PHENYLEPHRINE 80 MCG/ML (10ML) SYRINGE FOR IV PUSH (FOR BLOOD PRESSURE SUPPORT)
PREFILLED_SYRINGE | INTRAVENOUS | Status: AC
  Filled 2022-02-19: qty 10

## 2022-02-19 MED ORDER — LACTATED RINGERS IV SOLN: 500.0000 mL | Freq: Once | INTRAVENOUS | Status: DC | PRN

## 2022-02-19 MED ORDER — LACTATED RINGERS IV SOLN: INTRAVENOUS | Status: DC | PRN

## 2022-02-19 MED ORDER — MORPHINE SULFATE (PF) 2 MG/ML IV SOLN
1.0000 mg | INTRAVENOUS | Status: DC | PRN
  Administered 2022-02-19: 4 mg via INTRAVENOUS
  Filled 2022-02-19: qty 2

## 2022-02-19 MED ORDER — DEXMEDETOMIDINE HCL IN NACL 400 MCG/100ML IV SOLN
0.0000 ug/kg/h | INTRAVENOUS | Status: DC
  Administered 2022-02-19: 0.2 ug/kg/h via INTRAVENOUS
  Administered 2022-02-19: 0.7 ug/kg/h via INTRAVENOUS
  Filled 2022-02-19: qty 100

## 2022-02-19 MED ORDER — INSULIN REGULAR(HUMAN) IN NACL 100-0.9 UT/100ML-% IV SOLN: INTRAVENOUS | Status: DC

## 2022-02-19 MED ORDER — PROTAMINE SULFATE 10 MG/ML IV SOLN
INTRAVENOUS | Status: AC
  Filled 2022-02-19: qty 25

## 2022-02-19 MED ORDER — INSULIN DETEMIR 100 UNIT/ML ~~LOC~~ SOLN
5.0000 [IU] | Freq: Two times a day (BID) | SUBCUTANEOUS | Status: DC
  Administered 2022-02-19 – 2022-02-22 (×7): 5 [IU] via SUBCUTANEOUS
  Filled 2022-02-19 (×9): qty 0.05

## 2022-02-19 MED ORDER — METOPROLOL TARTRATE 12.5 MG HALF TABLET: 12.5000 mg | ORAL_TABLET | Freq: Once | ORAL | Status: DC

## 2022-02-19 MED ORDER — PHENYLEPHRINE 80 MCG/ML (10ML) SYRINGE FOR IV PUSH (FOR BLOOD PRESSURE SUPPORT)
PREFILLED_SYRINGE | INTRAVENOUS | Status: DC | PRN
  Administered 2022-02-19 (×2): 160 ug via INTRAVENOUS

## 2022-02-19 SURGICAL SUPPLY — 91 items
ADAPTER CARDIO PERF ANTE/RETRO (ADAPTER) ×2 IMPLANT
ADAPTER MULTI PERFUSION 15 (ADAPTER) IMPLANT
BAG DECANTER FOR FLEXI CONT (MISCELLANEOUS) ×2 IMPLANT
BLADE 11 SAFETY STRL DISP (BLADE) IMPLANT
BLADE CLIPPER SURG (BLADE) ×2 IMPLANT
BLADE STERNUM SYSTEM 6 (BLADE) ×2 IMPLANT
BLADE SURG 15 STRL LF DISP TIS (BLADE) IMPLANT
BLADE SURG 15 STRL SS (BLADE) ×2
BNDG ELASTIC 4X5.8 VLCR STR LF (GAUZE/BANDAGES/DRESSINGS) ×2 IMPLANT
BNDG ELASTIC 6X5.8 VLCR STR LF (GAUZE/BANDAGES/DRESSINGS) ×2 IMPLANT
BNDG GAUZE DERMACEA FLUFF 4 (GAUZE/BANDAGES/DRESSINGS) ×2 IMPLANT
CANISTER SUCT 3000ML PPV (MISCELLANEOUS) ×2 IMPLANT
CANNULA MC2 2 STG 36/46 NON-V (CANNULA) IMPLANT
CANNULA NON VENT 20FR 12 (CANNULA) IMPLANT
CANNULA NON VENT 22FR 12 (CANNULA) ×2 IMPLANT
CANNULA VENOUS 2 STG 34/46 (CANNULA) ×2
CATH HEART VENT LEFT (CATHETERS) ×2 IMPLANT
CATH RETROPLEGIA CORONARY 14FR (CATHETERS) ×2 IMPLANT
CATH ROBINSON RED A/P 18FR (CATHETERS) ×6 IMPLANT
CATH THOR STR 32F SOFT 20 RADI (CATHETERS) ×4 IMPLANT
CATH THORACIC 28FR RT ANG (CATHETERS) ×2 IMPLANT
CLIP VESOCCLUDE MED 24/CT (CLIP) IMPLANT
CLIP VESOCCLUDE SM WIDE 24/CT (CLIP) IMPLANT
CNTNR URN SCR LID CUP LEK RST (MISCELLANEOUS) IMPLANT
CONT SPEC 4OZ STRL OR WHT (MISCELLANEOUS) ×2
CONTAINER PROTECT SURGISLUSH (MISCELLANEOUS) ×4 IMPLANT
COVER ULTRASOUND PROBE 36 ST (MISCELLANEOUS) IMPLANT
DEV SUMP PERICARDIAL 20F 15IN (MISCELLANEOUS) IMPLANT
DEVICE SUT CK QUICK LOAD MINI (Prosthesis & Implant Heart) IMPLANT
DRAPE CARDIOVASCULAR INCISE (DRAPES) ×2
DRAPE INCISE IOBAN 66X45 STRL (DRAPES) IMPLANT
DRAPE SRG 135X102X78XABS (DRAPES) ×2 IMPLANT
DRAPE WARM FLUID 44X44 (DRAPES) ×2 IMPLANT
DRSG AQUACEL AG ADV 3.5X10 (GAUZE/BANDAGES/DRESSINGS) IMPLANT
DRSG AQUACEL AG ADV 3.5X14 (GAUZE/BANDAGES/DRESSINGS) ×2 IMPLANT
ELECT CAUTERY BLADE 6.4 (BLADE) ×2 IMPLANT
ELECT REM PT RETURN 9FT ADLT (ELECTROSURGICAL) ×4
ELECTRODE REM PT RTRN 9FT ADLT (ELECTROSURGICAL) ×4 IMPLANT
FELT TEFLON 1X6 (MISCELLANEOUS) ×4 IMPLANT
GAUZE SPONGE 4X4 12PLY STRL (GAUZE/BANDAGES/DRESSINGS) ×4 IMPLANT
GLOVE BIO SURGEON STRL SZ 6.5 (GLOVE) IMPLANT
GLOVE BIOGEL PI IND STRL 7.0 (GLOVE) IMPLANT
GLOVE SS BIOGEL STRL SZ 6 (GLOVE) IMPLANT
GLOVE SS BIOGEL STRL SZ 7.5 (GLOVE) IMPLANT
GOWN STRL REUS W/ TWL LRG LVL3 (GOWN DISPOSABLE) ×12 IMPLANT
GOWN STRL REUS W/TWL LRG LVL3 (GOWN DISPOSABLE) ×14
HEMOSTAT POWDER SURGIFOAM 1G (HEMOSTASIS) ×6 IMPLANT
HEMOSTAT SURGICEL 2X14 (HEMOSTASIS) ×2 IMPLANT
INSERT FOGARTY 61MM (MISCELLANEOUS) IMPLANT
KIT BASIN OR (CUSTOM PROCEDURE TRAY) ×2 IMPLANT
KIT CATH CPB BARTLE (MISCELLANEOUS) ×2 IMPLANT
KIT SUCTION CATH 14FR (SUCTIONS) ×2 IMPLANT
KIT SUT CK MINI COMBO 4X17 (Prosthesis & Implant Heart) IMPLANT
KIT TURNOVER KIT B (KITS) ×2 IMPLANT
KIT VASOVIEW HEMOPRO 2 VH 4000 (KITS) ×2 IMPLANT
NS IRRIG 1000ML POUR BTL (IV SOLUTION) ×10 IMPLANT
PACK E OPEN HEART (SUTURE) ×2 IMPLANT
PACK OPEN HEART (CUSTOM PROCEDURE TRAY) ×2 IMPLANT
PAD ARMBOARD 7.5X6 YLW CONV (MISCELLANEOUS) ×4 IMPLANT
PAD ELECT DEFIB RADIOL ZOLL (MISCELLANEOUS) ×2 IMPLANT
PENCIL BUTTON HOLSTER BLD 10FT (ELECTRODE) ×2 IMPLANT
POSITIONER HEAD DONUT 9IN (MISCELLANEOUS) ×2 IMPLANT
PUNCH AORTIC ROTATE 4.0MM (MISCELLANEOUS) ×2 IMPLANT
PUNCH AORTIC ROTATE 4.5MM 8IN (MISCELLANEOUS) ×2 IMPLANT
SET MPS 3-ND DEL (MISCELLANEOUS) IMPLANT
SUPPORT HEART JANKE-BARRON (MISCELLANEOUS) ×2 IMPLANT
SUT BONE WAX W31G (SUTURE) ×2 IMPLANT
SUT EB EXC GRN/WHT 2-0 V-5 (SUTURE) ×4 IMPLANT
SUT MNCRL AB 4-0 PS2 18 (SUTURE) ×4 IMPLANT
SUT PROLENE 4 0 RB 1 (SUTURE) ×8
SUT PROLENE 4 0 SH DA (SUTURE) ×2 IMPLANT
SUT PROLENE 4-0 RB1 .5 CRCL 36 (SUTURE) IMPLANT
SUT PROLENE 7 0 BV1 MDA (SUTURE) ×2 IMPLANT
SUT SILK 2 0 SH (SUTURE) IMPLANT
SUT STEEL SZ 6 DBL 3X14 BALL (SUTURE) ×6 IMPLANT
SUT VIC AB 0 CTX 36 (SUTURE) ×4
SUT VIC AB 0 CTX36XBRD ANTBCTR (SUTURE) ×4 IMPLANT
SUT VIC AB 2-0 CT1 27 (SUTURE) ×4
SUT VIC AB 2-0 CT1 TAPERPNT 27 (SUTURE) ×4 IMPLANT
SYSTEM SAHARA CHEST DRAIN ATS (WOUND CARE) ×2 IMPLANT
TAPE CLOTH 3X10 TAN LF (GAUZE/BANDAGES/DRESSINGS) IMPLANT
TAPE CLOTH SURG 6X10 WHT LF (GAUZE/BANDAGES/DRESSINGS) IMPLANT
TOWEL GREEN STERILE (TOWEL DISPOSABLE) ×2 IMPLANT
TOWEL GREEN STERILE FF (TOWEL DISPOSABLE) ×2 IMPLANT
TRAY FOLEY SLVR 16FR TEMP STAT (SET/KITS/TRAYS/PACK) ×2 IMPLANT
TUBE SUCT INTRACARD DLP 20F (MISCELLANEOUS) IMPLANT
TUBING LAP HI FLOW INSUFFLATIO (TUBING) ×2 IMPLANT
UNDERPAD 30X36 HEAVY ABSORB (UNDERPADS AND DIAPERS) ×2 IMPLANT
VALVE REGENT 23MM (Valve) IMPLANT
VENT LEFT HEART 12002 (CATHETERS) ×2
WATER STERILE IRR 1000ML POUR (IV SOLUTION) ×4 IMPLANT

## 2022-02-19 NOTE — Interval H&P Note (Signed)
History and Physical Interval Note:  02/19/2022 6:31 AM  Craig Flores  has presented today for surgery, with the diagnosis of SEVERE AS.  The various methods of treatment have been discussed with the patient and family. After consideration of risks, benefits and other options for treatment, the patient has consented to  Procedure(s): AORTIC VALVE REPLACEMENT (AVR) (N/A) TRANSESOPHAGEAL ECHOCARDIOGRAM (TEE) (N/A) as a surgical intervention.  The patient's history has been reviewed, patient examined, no change in status, stable for surgery.  I have reviewed the patient's chart and labs.  Questions were answered to the patient's satisfaction.     Coralie Common

## 2022-02-19 NOTE — Consult Note (Signed)
NAME:  Craig Flores, MRN:  [id number], DOB:  Aug 22, 1965, LOS: 0 ADMISSION DATE:  02/19/2022, CONSULTATION DATE:  1/24 REFERRING MD:  Lavonna Monarch, CHIEF COMPLAINT:  post -AVR  History of Present Illness:  Craig Flores is a 56 year old gentleman with a history of coronary artery disease with ischemic cardiomyopathy and HFrEF, bicuspid aortic valve with severe aortic stenosis, GERD who presented for aortic valve replacement.  He developed shortness of breath and was evaluated by cardiology.  Although his gradient was low echocardiographically this was felt to be a low-flow low gradient stenosis due to HFrEF.  Baseline LVEF 25 to 30%, normal RV function.  No blood products given in the operating room. Post-operatively he is on milrinone and epinephrine. PCCM consulted for post-op medical management.  Pertinent  Medical History  HFrEF 2/2 ICM AS, bicuspid valve GERD  Significant Hospital Events: Including procedures, antibiotic start and stop dates in addition to other pertinent events   1/24 mechanical AVR on bypass  Interim History / Subjective:    Objective   Blood pressure (!) 104/50, pulse 70, temperature 98.8 F (37.1 C), temperature source Oral, resp. rate 16, height '5\' 9"'$  (1.753 m), weight 84.4 kg, SpO2 92 %.    FiO2 (%):  [50 %] 50 %   Intake/Output Summary (Last 24 hours) at 02/19/2022 1125 Last data filed at 02/19/2022 1050 Gross per 24 hour  Intake 1480 ml  Output 200 ml  Net 1280 ml   Filed Weights   02/19/22 0537  Weight: 84.4 kg    Examination: General: critically ill appearing man lying in bed in NAD HENT: [place]/AT, eyes anicteric Lungs: breathing comfortably on MV, CTAB Cardiovascular: S1S2, RRR Abdomen: soft, NT Extremities: no cyanosis or edema Neuro: RASS -5, pinpoint pupils GU: foley draining clear urine  Post-op labs pending CXR personally reviewed:  AICD in place, pulm edema, ETT 1 cm above carina    Resolved Hospital Problem list     Assessment &  Plan:   Severe AS, bicuspid aortic valve s/p mechanical AVR -hold warfarin until stable post-op without bleeding -complete post-op antibiotics -rapid wean vent -post op labs pending -post-op pain control - morphine, oxycodone, tramadol  Chronic HFrEF due to ICM Previous history of LV thrombus -hold PTA warfarin -hold GDMT  -inotropic support -tele monitoring  Post-op vent management Pulmonary edema on CXR -rapid wean protocol -wean supplemental O2 as able -may need diuretics  Hyperglycemia -insulin gtt per protocol  Hypocalcemia -repleted  GERD -pepcid, protonix   Best Practice (right click and "Reselect all SmartList Selections" daily)   Diet/type: NPO DVT prophylaxis: SCD GI prophylaxis: H2B and PPI Lines: Central line, Arterial Line, and yes and it is still needed Foley:  Yes, and it is still needed Code Status:  full code Last date of multidisciplinary goals of care discussion [ per primary]  Labs   CBC: Recent Labs  Lab 02/17/22 1224 02/19/22 0814 02/19/22 0937 02/19/22 0954 02/19/22 1001 02/19/22 1019 02/19/22 1023  WBC 5.9  --   --   --   --   --   --   HGB 14.4   < > 9.5* 10.0* 9.2* 9.2* 10.2*  HCT 41.8   < > 28.0* 29.0* 27.0* 27.0* 30.0*  MCV 91.5  --   --   --   --   --   --   PLT 251  --   --  196  --   --   --    < > = values  in this interval not displayed.    Basic Metabolic Panel: Recent Labs  Lab 02/17/22 1224 02/19/22 0814 02/19/22 0824 02/19/22 0841 02/19/22 0858 02/19/22 0928 02/19/22 0937 02/19/22 1001 02/19/22 1019 02/19/22 1023  NA 134*   < > 139 138   < > 138 138 137 138 138  K 4.6   < > 4.4 4.5   < > 5.3* 5.3* 5.5* 5.0 5.1  CL 105  --  104 104  --  103  --   --   --  102  CO2 23  --   --   --   --   --   --   --   --   --   GLUCOSE 89  --  124* 103*  --  115*  --   --   --  141*  BUN 22*  --  25* 24*  --  24*  --   --   --  24*  CREATININE 1.01  --  1.20 1.30*  --  1.10  --   --   --  1.10  CALCIUM 9.6  --   --    --   --   --   --   --   --   --    < > = values in this interval not displayed.   GFR: Estimated Creatinine Clearance: 75 mL/min (by C-G formula based on SCr of 1.1 mg/dL). Recent Labs  Lab 02/17/22 1224  WBC 5.9    Liver Function Tests: Recent Labs  Lab 02/17/22 1224  AST 24  ALT 27  ALKPHOS 28*  BILITOT 0.5  PROT 6.6  ALBUMIN 4.3   No results for input(s): "LIPASE", "AMYLASE" in the last 168 hours. No results for input(s): "AMMONIA" in the last 168 hours.  ABG    Component Value Date/Time   PHART 7.312 (L) 02/19/2022 1019   PCO2ART 47.8 02/19/2022 1019   PO2ART 131 (H) 02/19/2022 1019   HCO3 24.2 02/19/2022 1019   TCO2 24 02/19/2022 1023   ACIDBASEDEF 2.0 02/19/2022 1019   O2SAT 99 02/19/2022 1019     Coagulation Profile: Recent Labs  Lab 02/19/22 0639  INR 1.0    Cardiac Enzymes: No results for input(s): "CKTOTAL", "CKMB", "CKMBINDEX", "TROPONINI" in the last 168 hours.  HbA1C: Hgb A1c MFr Bld  Date/Time Value Ref Range Status  02/17/2022 12:25 PM 5.8 (H) 4.8 - 5.6 % Final    Comment:    (NOTE) Pre diabetes:          5.7%-6.4%  Diabetes:              >6.4%  Glycemic control for   <7.0% adults with diabetes   08/28/2019 02:58 PM 5.8 (H) 4.8 - 5.6 % Final    Comment:    (NOTE) Pre diabetes:          5.7%-6.4%  Diabetes:              >6.4%  Glycemic control for   <7.0% adults with diabetes     CBG: No results for input(s): "GLUCAP" in the last 168 hours.  Review of Systems:   Unable to obtain due to mental status.   Past Medical History:  He,  Flores a past medical history of Acid reflux, AICD (automatic cardioverter/defibrillator) present, Anxiety, Aortic stenosis with bicuspid valve, CHF (congestive heart failure) ([place]), Depression, Dyspnea, GERD (gastroesophageal reflux disease), HLD (hyperlipidemia), Ischemic cardiomyopathy, Migraines, and Myocardial infarction ([place]) (08/28/2019).   Surgical  History:   Past Surgical History:   Procedure Laterality Date   COLONOSCOPY WITH PROPOFOL N/A 07/16/2021   Procedure: COLONOSCOPY WITH PROPOFOL;  Surgeon: Gatha Mayer, MD;  Location: [facility];  Service: Gastroenterology;  Laterality: N/A;   CORONARY/GRAFT ACUTE MI REVASCULARIZATION N/A 08/28/2019   Procedure: Coronary/Graft Acute MI Revascularization;  Surgeon: [name], MD;  Location: [place] [facility];  Service: Cardiovascular;  Laterality: N/A;   ICD IMPLANT N/A 11/28/2019   Procedure: ICD IMPLANT;  Surgeon: Deboraha Sprang, MD;  Location: [place] [facility];  Service: Cardiovascular;  Laterality: N/A;   LEFT HEART CATH AND CORONARY ANGIOGRAPHY N/A 08/28/2019   Procedure: LEFT HEART CATH AND CORONARY ANGIOGRAPHY;  Surgeon: [name], MD;  Location: [place] [facility];  Service: Cardiovascular;  Laterality: N/A;   POLYPECTOMY  07/16/2021   Procedure: POLYPECTOMY;  Surgeon: Gatha Mayer, MD;  Location: [facility];  Service: Gastroenterology;;   RIGHT HEART CATH AND CORONARY ANGIOGRAPHY N/A 12/12/2021   Procedure: RIGHT HEART CATH AND CORONARY ANGIOGRAPHY;  Surgeon: Burnell Blanks, MD;  Location: [place] [facility];  Service: Cardiovascular;  Laterality: N/A;   WISDOM TOOTH EXTRACTION       Social History:   reports that he Flores been smoking cigarettes. He Flores been smoking an average of .25 packs per day. He Flores never used smokeless tobacco. He reports that he does not currently use alcohol. He reports that he does not use drugs.   Family History:  His family history includes Arrhythmia in his father; Cancer in his maternal grandmother and mother; Colon cancer (age of onset: 43) in his brother; Diabetes in his paternal grandmother; Heart attack in his maternal grandfather and paternal grandfather; Prostate cancer in his father.   Allergies No Known Allergies   Home Medications  Prior to Admission medications   Medication Sig Start Date End Date Taking? Authorizing Provider   acetaminophen (TYLENOL) 500 MG tablet Take 500 mg by mouth every 6 (six) hours as needed for headache.   Yes [provider]  aspirin 81 MG chewable tablet Chew 1 tablet (81 mg total) by mouth daily. 09/25/21  Yes [name], MD  atorvastatin (LIPITOR) 80 MG tablet Take 1 tablet (80 mg total) by mouth daily. 12/23/21  Yes [name], MD  carvedilol (COREG) 12.5 MG tablet Take 1 tablet (12.5 mg total) by mouth 2 (two) times daily with a meal. 01/13/22  Yes Milford, Maricela Bo, FNP  dapagliflozin propanediol (FARXIGA) 10 MG TABS tablet Take 1 tablet (10 mg total) by mouth daily before breakfast. 12/30/21  Yes [name], MD  digoxin (LANOXIN) 0.125 MG tablet Take 1/2 tablet (0.0625 mg total) by mouth daily. 02/18/21  Yes [name], MD  enoxaparin (LOVENOX) 80 MG/0.8ML injection Inject 0.8 mLs (80 mg total) into the skin every 12 (twelve) hours. 02/10/22  Yes [name], MD  ezetimibe (ZETIA) 10 MG tablet Take 1 tablet (10 mg total) by mouth daily. 12/16/21  Yes [name], MD  fenofibrate (TRICOR) 145 MG tablet Take 1 tablet (145 mg total) by mouth daily. 10/07/21  Yes [name], MD  furosemide (LASIX) 20 MG tablet Take 1 tablet (20 mg total) by mouth as needed. Patient taking differently: Take 20 mg by mouth daily as needed for edema. 11/20/21  Yes [name], MD  hydrOXYzine (ATARAX) 25 MG tablet Take 1 tablet (25 mg total) by mouth 3 (three) times daily as needed.  10/28/21  Yes Eulis Canner E, NP  melatonin 5 MG TABS Take 10 mg by mouth at bedtime.   Yes [provider]  mirtazapine (REMERON) 45 MG tablet Take 1 tablet (45 mg total) by mouth at bedtime. 10/28/21  Yes Eulis Canner E, NP  Multiple Vitamin (MULTIVITAMIN WITH MINERALS) TABS tablet Take 1 tablet by mouth daily. 05/02/14  Yes Niel Hummer, NP  omega-3 acid ethyl esters (LOVAZA) 1 g capsule TAKE 2 CAPSULES (2 GRAMS TOTAL) BY MOUTH TWO TIMES DAILY. 11/08/21  Yes [name], MD  omeprazole (PRILOSEC) 20 MG capsule Take 20 mg by mouth daily before breakfast.   Yes [provider]  Probiotic Product (PROBIOTIC DAILY PO) Take 1 capsule by mouth with breakfast, with lunch, and with evening meal.   Yes [provider]  sacubitril-valsartan (ENTRESTO) 97-103 MG Take 1 tablet by mouth 2 (two) times daily. 11/06/21  Yes [name], MD  sertraline (ZOLOFT) 100 MG tablet Take 1.5 tablets (150 mg total) by mouth daily. 10/28/21  Yes Eulis Canner E, NP  spironolactone (ALDACTONE) 25 MG tablet Take 1 tablet (25 mg total) by mouth daily. 12/16/21  Yes [name], MD  varenicline (CHANTIX) 0.5 MG tablet Take 1 tablet (0.5 mg total) by mouth daily for 3 days, THEN 1 tablet (0.5 mg total) 2 (two) times daily for 4 days, THEN 2 tablets (1 mg total) 2 (two) times daily. 01/13/22 03/29/22 Yes [place]ford, Maricela Bo, FNP  warfarin (COUMADIN) 5 MG tablet Take 1.5 tablets to 2 tablets by mouth once daily as directed by Anticoagulation clinic Patient taking differently: Take 7.5-10 mg by mouth See admin instructions. Take 2 tablets (10 mg) by mouth on Sundays, Mondays, Wednesdays & Fridays in the evening. Take 1.5 tablet (7.5 mg) by mouth on Tuesdays, Thursdays & Saturdays in the evening. 01/29/22  Yes [name], MD  SILDENAFIL CITRATE PO Take 45-90 mg by mouth daily as needed (ED). Take 1 - 2 tablets 30 to 45 min before sexual activity as needed 10/12/19   [provider]  MITIGARE 0.6 MG CAPS Take 0.6 mg by mouth daily. 12/05/19 04/04/20  [name], MD     Critical care time: 40 min.     Julian Hy, DO 02/19/22 11:47 AM [facility]  For contact information, see Amion. If no response to pager, please call PCCM consult pager. After hours, 7PM- 7AM, please call Elink.

## 2022-02-19 NOTE — Anesthesia Procedure Notes (Signed)
Procedure Name: Intubation Date/Time: 02/19/2022 7:52 AM  Performed by: Anastasio Auerbach, CRNAPre-anesthesia Checklist: Patient identified, Emergency Drugs available, Suction available and Patient being monitored Patient Re-evaluated:Patient Re-evaluated prior to induction Oxygen Delivery Method: Circle system utilized Preoxygenation: Pre-oxygenation with 100% oxygen Induction Type: IV induction Ventilation: Mask ventilation without difficulty Laryngoscope Size: Mac and 3 Grade View: Grade I Tube type: Oral Number of attempts: 1 Airway Equipment and Method: Stylet and Oral airway Placement Confirmation: ETT inserted through vocal cords under direct vision, positive ETCO2 and breath sounds checked- equal and bilateral Secured at: 23 cm Tube secured with: Tape Dental Injury: Teeth and Oropharynx as per pre-operative assessment

## 2022-02-19 NOTE — Procedures (Signed)
Extubation Procedure Note  Patient Details:   Name: Craig Flores DOB: 1965-02-04 MRN: [id number]   Airway Documentation:    Vent end date: 02/19/22 Vent end time: 1428   Evaluation  O2 sats: stable throughout Complications: No apparent complications Patient did tolerate procedure well. Bilateral Breath Sounds: Clear   Yes  Positive cuff leak noted, VC 550, NIF -20. Patient placed on  4L with humidity. No stridor noted. Pt able to reach 232ms using incentive spirometer.  HCarney Bern1/24/2024, 2:37 PM

## 2022-02-19 NOTE — Anesthesia Postprocedure Evaluation (Signed)
Anesthesia Post Note  Patient: Craig Flores  Procedure(s) Performed: AORTIC VALVE REPLACEMENT (AVR) 1m SJM REGENT MECHANICAL AORTIC VALVE (Chest) TRANSESOPHAGEAL ECHOCARDIOGRAM (TEE)     Patient location during evaluation: SICU Anesthesia Type: General Level of consciousness: sedated Pain management: pain level controlled Vital Signs Assessment: post-procedure vital signs reviewed and stable Respiratory status: patient remains intubated per anesthesia plan Cardiovascular status: stable Postop Assessment: no apparent nausea or vomiting Anesthetic complications: no  No notable events documented.  Last Vitals:  Vitals:   02/19/22 1430 02/19/22 1500  BP:  100/67  Pulse: 78 77  Resp: (!) 25 (!) 26  Temp: 36.8 C 37 C  SpO2: 91% 96%    Last Pain:  Vitals:   02/19/22 1451  TempSrc:   PainSc: 4                  Kolin Erdahl,W. EDMOND

## 2022-02-19 NOTE — Transfer of Care (Signed)
Immediate Anesthesia Transfer of Care Note  Patient: Craig Flores  Procedure(s) Performed: AORTIC VALVE REPLACEMENT (AVR) 37m SJM REGENT MECHANICAL AORTIC VALVE (Chest) TRANSESOPHAGEAL ECHOCARDIOGRAM (TEE)  Patient Location: SICU  Anesthesia Type:General  Level of Consciousness: sedated and Patient remains intubated per anesthesia plan  Airway & Oxygen Therapy: Patient remains intubated per anesthesia plan and Patient placed on Ventilator (see vital sign flow sheet for setting)  Post-op Assessment: Report given to RN and Post -op Vital signs reviewed and stable  Post vital signs: Reviewed and stable  Last Vitals:  Vitals Value Taken Time  BP 112/62 02/19/22 1121  Temp    Pulse 72 02/19/22 1121  Resp 16 02/19/22 1121  SpO2 92 % 02/19/22 1121  Vitals shown include unvalidated device data.  Last Pain:  Vitals:   02/19/22 0559  TempSrc:   PainSc: 0-No pain      Patients Stated Pain Goal: 0 [id number]  Complications: No notable events documented.

## 2022-02-19 NOTE — Anesthesia Procedure Notes (Signed)
Arterial Line Insertion Start/End1/24/2024 7:00 AM, 02/19/2022 7:08 AM Performed by: Anastasio Auerbach, CRNA, CRNA  Patient location: Pre-op. Preanesthetic checklist: patient identified, IV checked, site marked, risks and benefits discussed, surgical consent, monitors and equipment checked, pre-op evaluation, timeout performed and anesthesia consent Lidocaine 1% used for infiltration and patient sedated Left, radial was placed Catheter size: 20 G Hand hygiene performed , maximum sterile barriers used  and Seldinger technique used Allen's test indicative of satisfactory collateral circulation Attempts: 1 Procedure performed without using ultrasound guided technique. Following insertion, dressing applied and Biopatch. Post procedure assessment: normal and unchanged  Patient tolerated the procedure well with no immediate complications.

## 2022-02-19 NOTE — Anesthesia Procedure Notes (Signed)
Central Venous Catheter Insertion Performed by: Roderic Palau, MD, anesthesiologist Start/End1/24/2024 7:00 AM, 02/19/2022 7:15 AM Patient location: Pre-op. Preanesthetic checklist: patient identified, IV checked, site marked, risks and benefits discussed, surgical consent, monitors and equipment checked, pre-op evaluation, timeout performed and anesthesia consent Hand hygiene performed  and maximum sterile barriers used  PA cath was placed.Swan type:thermodilution PA Cath depth:50 Procedure performed without using ultrasound guided technique. Attempts: 1 Patient tolerated the procedure well with no immediate complications.

## 2022-02-19 NOTE — Progress Notes (Signed)
Echocardiogram Echocardiogram Transesophageal has been performed.  Craig Flores 02/19/2022, 9:08 AM

## 2022-02-19 NOTE — Op Note (Signed)
CARDIOVASCULAR SURGERY OPERATIVE NOTE  02/19/2022 Craig Flores [id number]  Surgeon:  Everlena Cooper, MD  First Assistant: Jadene Pierini [facility]                                 An experienced assistant was required given the complexity of this surgery and the standard of surgical care. The assistant was needed for exposure, dissection, suctioning, retraction of delicate tissues and sutures, instrument exchange and for overall help during this procedure.     Preoperative Diagnosis:  Severe aortic stenosis   Postoperative Diagnosis:  Same   Procedure:  Median Sternotomy Extracorporeal circulation 3.   Aortic valve replacement using a 77m ST Jude Mechanical  Regent  valve.([id number]  Anesthesia:  General Endotracheal   Clinical History/Surgical Indication:  Pt with severe low flow low gradient AS with ischemic cardiomyopathy and bicuspid valve stenosis with NYHA class 2 symptoms   Findings: Pt with bicuspid aortic valve with fusion of the right and left cusp and moderate calcification of valve At conclusion of procedure pt with well seated mechanical valve and a 446mg gradient and in NSR with EF 25%   Media Information      Preparation:  The patient was seen in the preoperative holding area and the correct patient, correct operation were confirmed with the patient after reviewing the medical record and catheterization. The consent was signed by me. Preoperative antibiotics were given. A pulmonary arterial line and radial arterial line were placed by the anesthesia team. The patient was taken back to the operating room and positioned supine on the operating room table. After being placed under general endotracheal anesthesia by the anesthesia team a foley catheter was placed. The neck, chest, abdomen, and both legs were prepped with betadine soap and solution and draped in the usual sterile manner. A surgical time-out was taken and the correct patient and operative procedure were  confirmed with the nursing and anesthesia staff.  Operation: A median sternotomy incision was then created and sternal valve sternal saw.  Pericardial well was developed and heparin was delivered.  The aorta was cannulated with a 2258rench aortic cannula and a two-stage cannula was placed in the right atrium for venous return.  With adequate confirmation of anticoagulation cardiopulmonary bypass was instituted and the left ventricular sump was placed because right superior pulmonary vein.  An antegrade cardioplegia catheter was placed in ascending aorta with adequate bypass the aortic cross-clamp was placed and [place] blood cardioplegia was delivered for approximately 1200 cc following arrest. The aorta was opened obliquely and the aortic valve identified photographed and resected.  The annulus was debrided and the left ventricular cavity irrigated with a saline solution.  The valve measured to a 23 mm mechanical prosthesis and seventeen 2-0 Tevdek pledgeted sutures were placed circle manually with the pledgets on the ventricular side.  Prosthesis was secured in place with the core knot system. Following inspection of the valve with good leaflet motion the aortotomy was closed with a running 4 oh pledgeted Prolene suture.  The patient in headdown position aortic cross-clamp was removed and multiple de-airing maneuvers were performed.  Left ventricular sump was removed and ventricular and atrial pacing wires were placed and the patient was weaned from cardiopulmonary bypass and a small amount of inotropic support.  With adequate hemodynamics protamine was delivered and the patient was decannulated and sites oversewn were necessary. With adequate hemostasis chest tubes were brought  out through inferior stab wounds and secured and the sternum was reapproximated with interrupted stainless steel wire and the presternal  subcutaneous tissue and skin were closed in multiple layers absorbable suture. Sterile  dressings were applied and the patient was taken to the intensive care unit in good condition Aortic cross-clamp time was 54 minutes and the perfusion time was 73 minutes.

## 2022-02-19 NOTE — Progress Notes (Signed)
Recruitment maneuver completed per CCM due to low PAO2. PC 20, Peep 5, 100%, RR 8, iT 3,00 for 2 minutes. Patient tolerated well. Patient placed back on SIMV/PS/PRVC.

## 2022-02-19 NOTE — Progress Notes (Signed)
ICD turned back on by Joey from [employer]

## 2022-02-19 NOTE — Brief Op Note (Signed)
02/19/2022  10:42 AM  PATIENT:  Craig Flores  57 y.o. male  PRE-OPERATIVE DIAGNOSIS:  SEVERE AS  POST-OPERATIVE DIAGNOSIS:  SEVERE AORTIC STENOSIS  PROCEDURE:  Procedure(s): AORTIC VALVE REPLACEMENT (AVR) 67m SJM REGENT MECHANICAL AORTIC VALVE (N/A) TRANSESOPHAGEAL ECHOCARDIOGRAM (TEE) (N/A)  SURGEON:  Surgeon(s) and Role:    *Coralie Common MD - Primary  PHYSICIAN ASSISTANT: Erik Nessel PA-C  ASSISTANTS: RNFA STAFF   ANESTHESIA:   general  EBL:960 ml  BLOOD ADMINISTERED:none  DRAINS:  2 MEDIASTINAL CHEST TUBES    LOCAL MEDICATIONS USED:  NONE  SPECIMEN:  Source of Specimen:  AORTIC VALVE LEAFLETS  DISPOSITION OF SPECIMEN:  PATHOLOGY  COUNTS:  YES  TOURNIQUET:  * No tourniquets in log *  DICTATION: .Dragon Dictation  PLAN OF CARE: Admit to inpatient   PATIENT DISPOSITION:  ICU - intubated and hemodynamically stable.   Delay start of Pharmacological VTE agent (>24hrs) due to surgical blood loss or risk of bleeding: yes  COMPLICATIONS : NO KNOWN

## 2022-02-19 NOTE — Anesthesia Procedure Notes (Signed)
Central Venous Catheter Insertion Performed by: Roderic Palau, MD, anesthesiologist Start/End1/24/2024 7:00 AM, 02/19/2022 7:15 AM Patient location: Pre-op. Preanesthetic checklist: patient identified, IV checked, site marked, risks and benefits discussed, surgical consent, monitors and equipment checked, pre-op evaluation, timeout performed and anesthesia consent Position: Trendelenburg Lidocaine 1% used for infiltration and patient sedated Hand hygiene performed , maximum sterile barriers used  and Seldinger technique used Catheter size: 8.5 Fr Total catheter length 10. Central line was placed.Sheath introducer Procedure performed using ultrasound guided technique. Ultrasound Notes:anatomy identified, needle tip was noted to be adjacent to the nerve/plexus identified, no ultrasound evidence of intravascular and/or intraneural injection and image(s) printed for medical record Attempts: 1 Following insertion, line sutured, dressing applied and Biopatch. Post procedure assessment: blood return through all ports, free fluid flow and no air  Patient tolerated the procedure well with no immediate complications.

## 2022-02-19 NOTE — Hospital Course (Addendum)
  Craig Dresser, MD Pcp, No   History of Present Illness:     Pt is a very pleasant 57 yo wm who suffered an anterior MI several years ago that has led to ischemic cardiomyopathy with an EF of 25-30% that has also been noted with chronic LV thrombus treated with coumadin. Pt has had a defibrillator placed over the past two years but has been having increasing DOE and dizziness the past several months. Pt had echo confirming LV dysfunction with EF 25-30% but now with severe low flow low gradient AS with a calculated valve area of .88cm2 and a mean gradient of 28mHg. He as what appears to be a bicuspid valve. Secondary to his LV thrombus and his age it was felt pt may best be served with SAVR. Pt has had cath with occluded chronic LAD without target and moderate RCA stenosis.Following review of the patient and his studies Dr WLavonna Monarchrecommended admission for AVR with mechanical valve.   Hospital course:  The patent was admitted electively and taken to the OR and underwent AVR with Regent St. Jude #23 mechanical prosthesis. He tolerated the procedure well and was taken to the SICU in stable condition.  The patient was extubated the evening of surgery.  He was weaned off Neo-synephrine as hemodynamics allowed.  He was started on coumadin for his mechanical AVR.  Daily PT/INR were monitored. His chest tubes and pacing wires were removed without difficulty. He was transitioned off the Insulin drip. His pre op HGA1C was 5.8. Will stop accu checks and SS PRN upon transfer. Will provide nutrition information with paperwork. He will need further surveillance of HGA1C with medical doctor after discharge. He was felt surgically stable for transfer from the ICU to 4E for further convalescence on postop day 2.  Diet and activity were advanced and well-tolerated.  Coumadin was continued for the [facility] aortic valve.  The Coumadin dosing was managed by pharmacy.  He had mild tachycardia postoperatively.   The metoprolol was titrated accordingly.

## 2022-02-20 ENCOUNTER — Other Ambulatory Visit: Payer: Self-pay

## 2022-02-20 ENCOUNTER — Inpatient Hospital Stay (HOSPITAL_COMMUNITY): Payer: Medicaid Other

## 2022-02-20 DIAGNOSIS — I255 Ischemic cardiomyopathy: Secondary | ICD-10-CM

## 2022-02-20 DIAGNOSIS — I350 Nonrheumatic aortic (valve) stenosis: Secondary | ICD-10-CM

## 2022-02-20 DIAGNOSIS — Z9889 Other specified postprocedural states: Secondary | ICD-10-CM

## 2022-02-20 DIAGNOSIS — Q231 Congenital insufficiency of aortic valve: Secondary | ICD-10-CM | POA: Diagnosis not present

## 2022-02-20 DIAGNOSIS — I5022 Chronic systolic (congestive) heart failure: Secondary | ICD-10-CM | POA: Diagnosis not present

## 2022-02-20 LAB — BASIC METABOLIC PANEL
Anion gap: 7 (ref 5–15)
Anion gap: 8 (ref 5–15)
BUN: 15 mg/dL (ref 6–20)
BUN: 14 mg/dL (ref 6–20)
CO2: 24 mmol/L (ref 22–32)
CO2: 26 mmol/L (ref 22–32)
Calcium: 7.8 mg/dL — ABNORMAL LOW (ref 8.9–10.3)
Calcium: 8.3 mg/dL — ABNORMAL LOW (ref 8.9–10.3)
Chloride: 100 mmol/L (ref 98–111)
Chloride: 99 mmol/L (ref 98–111)
Creatinine, Ser: 1.07 mg/dL (ref 0.61–1.24)
Creatinine, Ser: 1.09 mg/dL (ref 0.61–1.24)
GFR, Estimated: >60 mL/min (ref >60)
GFR, Estimated: >60 mL/min (ref >60)
Glucose, Bld: 118 mg/dL — ABNORMAL HIGH (ref 70–99)
Glucose, Bld: 125 mg/dL — ABNORMAL HIGH (ref 70–99)
Potassium: 4.4 mmol/L (ref 3.5–5.1)
Potassium: 4 mmol/L (ref 3.5–5.1)
Sodium: 131 mmol/L — ABNORMAL LOW (ref 135–145)
Sodium: 133 mmol/L — ABNORMAL LOW (ref 135–145)

## 2022-02-20 LAB — CBC
HCT: 29.4 % — ABNORMAL LOW (ref 39.0–52.0)
HCT: 30.1 % — ABNORMAL LOW (ref 39.0–52.0)
Hemoglobin: 9.7 g/dL — ABNORMAL LOW (ref 13.0–17.0)
Hemoglobin: 10.4 g/dL — ABNORMAL LOW (ref 13.0–17.0)
MCH: 31.1 pg (ref 26.0–34.0)
MCH: 31.6 pg (ref 26.0–34.0)
MCHC: 33 g/dL (ref 30.0–36.0)
MCHC: 34.6 g/dL (ref 30.0–36.0)
MCV: 94.2 fL (ref 80.0–100.0)
MCV: 91.5 fL (ref 80.0–100.0)
Platelets: 153 10*3/uL (ref 150–400)
Platelets: 171 10*3/uL (ref 150–400)
RBC: 3.12 MIL/uL — ABNORMAL LOW (ref 4.22–5.81)
RBC: 3.29 MIL/uL — ABNORMAL LOW (ref 4.22–5.81)
RDW: 13.4 % (ref 11.5–15.5)
RDW: 13.4 % (ref 11.5–15.5)
WBC: 7.5 10*3/uL (ref 4.0–10.5)
WBC: 9.4 10*3/uL (ref 4.0–10.5)
nRBC: 0 % (ref 0.0–0.2)
nRBC: 0 % (ref 0.0–0.2)

## 2022-02-20 LAB — GLUCOSE, CAPILLARY
Glucose-Capillary: 108 mg/dL — ABNORMAL HIGH (ref 70–99)
Glucose-Capillary: 109 mg/dL — ABNORMAL HIGH (ref 70–99)
Glucose-Capillary: 113 mg/dL — ABNORMAL HIGH (ref 70–99)
Glucose-Capillary: 120 mg/dL — ABNORMAL HIGH (ref 70–99)
Glucose-Capillary: 129 mg/dL — ABNORMAL HIGH (ref 70–99)
Glucose-Capillary: 138 mg/dL — ABNORMAL HIGH (ref 70–99)

## 2022-02-20 LAB — SURGICAL PATHOLOGY

## 2022-02-20 LAB — MAGNESIUM
Magnesium: 2.5 mg/dL — ABNORMAL HIGH (ref 1.7–2.4)
Magnesium: 2.4 mg/dL (ref 1.7–2.4)

## 2022-02-20 MED ORDER — INSULIN ASPART 100 UNIT/ML IJ SOLN: 0.0000 [IU] | INTRAMUSCULAR | Status: DC

## 2022-02-20 MED ORDER — FUROSEMIDE 10 MG/ML IJ SOLN
40.0000 mg | Freq: Once | INTRAMUSCULAR | Status: AC
  Administered 2022-02-20: 40 mg via INTRAVENOUS
  Filled 2022-02-20: qty 4

## 2022-02-20 MED ORDER — ENOXAPARIN SODIUM 40 MG/0.4ML IJ SOSY
40.0000 mg | PREFILLED_SYRINGE | Freq: Every day | INTRAMUSCULAR | Status: DC
  Administered 2022-02-20: 40 mg via SUBCUTANEOUS
  Filled 2022-02-20: qty 0.4

## 2022-02-20 MED ORDER — INSULIN ASPART 100 UNIT/ML IJ SOLN
0.0000 [IU] | Freq: Three times a day (TID) | INTRAMUSCULAR | Status: DC
  Administered 2022-02-20 – 2022-02-22 (×4): 2 [IU] via SUBCUTANEOUS

## 2022-02-20 MED ORDER — ASPIRIN 81 MG PO TBEC
81.0000 mg | DELAYED_RELEASE_TABLET | Freq: Every day | ORAL | Status: DC
  Administered 2022-02-21 – 2022-02-24 (×4): 81 mg via ORAL
  Filled 2022-02-20 (×4): qty 1

## 2022-02-20 MED ORDER — WARFARIN - PHARMACIST DOSING INPATIENT: Freq: Every day | Status: DC

## 2022-02-20 MED ORDER — WARFARIN SODIUM 5 MG PO TABS
5.0000 mg | ORAL_TABLET | Freq: Once | ORAL | Status: AC
  Administered 2022-02-20: 5 mg via ORAL
  Filled 2022-02-20: qty 1

## 2022-02-20 MED ORDER — DIGOXIN 125 MCG PO TABS
0.0625 mg | ORAL_TABLET | Freq: Every day | ORAL | Status: DC
  Administered 2022-02-20 – 2022-02-24 (×5): 0.0625 mg via ORAL
  Filled 2022-02-20 (×5): qty 1

## 2022-02-20 NOTE — Progress Notes (Addendum)
ANTICOAGULATION CONSULT NOTE  Pharmacy Consult for warfarin Indication:  hx LV thrombus, mechanical AVR  No Known Allergies  Patient Measurements: Height: '5\' 9"'$  (175.3 cm) Weight: 90.3 kg (199 lb) IBW/kg (Calculated) : 70.7 Heparin Dosing Weight: 84.4 kg  Vital Signs: Temp: 98.1 F (36.7 C) (01/25 1203) Temp Source: Oral (01/25 1203) BP: 115/75 (01/25 1300) Pulse Rate: 88 (01/25 1300)  Labs: Recent Labs    02/19/22 0639 02/19/22 0814 02/19/22 1023 02/19/22 1126 02/19/22 1129 02/19/22 1552 02/19/22 1655 02/20/22 0307  HGB  --    < > 10.2* 10.1*   < > 9.5* 10.5* 9.7*  HCT  --    < > 30.0* 30.5*   < > 28.0* 30.5* 29.4*  PLT  --    < >  --  186  --   --  163 153  APTT  --   --   --  28  --   --   --   --   LABPROT 13.2  --   --  16.6*  --   --   --   --   INR 1.0  --   --  1.4*  --   --   --   --   CREATININE  --    < > 1.10  --   --   --  1.01 1.07   < > = values in this interval not displayed.    Estimated Creatinine Clearance: 85.6 mL/min (by C-G formula based on SCr of 1.07 mg/dL).   Medical History: Past Medical History:  Diagnosis Date   Acid reflux    AICD (automatic cardioverter/defibrillator) present    Anxiety    Aortic stenosis with bicuspid valve    CHF (congestive heart failure) (HCC)    Depression    Dyspnea    GERD (gastroesophageal reflux disease)    HLD (hyperlipidemia)    Ischemic cardiomyopathy    Migraines    Myocardial infarction ([place]) 08/28/2019    Medications:  Scheduled:   acetaminophen  1,000 mg Oral Q6H   Or   acetaminophen (TYLENOL) oral liquid 160 mg/5 mL  1,000 mg Per Tube Q6H   acetaminophen (TYLENOL) oral liquid 160 mg/5 mL  650 mg Per Tube Once   Or   acetaminophen  650 mg Rectal Once   [START ON 02/21/2022] aspirin EC  81 mg Oral Daily   atorvastatin  80 mg Oral Daily   bisacodyl  10 mg Oral Daily   Or   bisacodyl  10 mg Rectal Daily   Chlorhexidine Gluconate Cloth  6 each Topical Daily   digoxin  0.0625 mg Oral  Daily   docusate sodium  200 mg Oral Daily   enoxaparin (LOVENOX) injection  40 mg Subcutaneous QHS   ezetimibe  10 mg Oral Daily   fenofibrate  160 mg Oral Daily   insulin aspart  0-24 Units Subcutaneous TID AC & HS   insulin detemir  5 Units Subcutaneous Q12H   metoprolol tartrate  12.5 mg Oral BID   Or   metoprolol tartrate  12.5 mg Per Tube BID   omega-3 acid ethyl esters  2 g Oral BID   [START ON 02/21/2022] pantoprazole  40 mg Oral Daily   sodium chloride flush  3 mL Intravenous Q12H   warfarin  5 mg Oral ONCE-1600   Warfarin - Pharmacist Dosing Inpatient   Does not apply q1600    Assessment: 66 yom presenting for mechanical AVR - was on warfarin  PTA for hx mural thrombus. Last dose of warfarin was 1/19 - was bridged with enoxaparin. PTA regimen is 10 mg daily except 7.5 mg TThSat.  Hgb 9.7, plt 153. No s/sx of bleeding. INR yesterday was 1.4 post-op.   Goal of Therapy:  INR 3-3.5 Monitor platelets by anticoagulation protocol: Yes   Plan:  Warfarin 5 mg tonight  Currently receiving enox40 mg for DVT ppx  Monitor daily INR, CBC, s/sx of bleeding   Antonietta Jewel, PharmD, BCCCP Clinical Pharmacist  Phone: [phone] 02/20/2022 4:11 PM  Please check AMION for all [place] phone numbers After 10:00 PM, call [employer] [phone]

## 2022-02-20 NOTE — Progress Notes (Signed)
Patient ID: Craig Flores, male   DOB: 1965-03-18, 57 y.o.   MRN: [id number] TCTS Evening Rounds:  Hemodynamically stable off neo. Sats 97% on 6L [place] Not pulling much on IS. Will start diuresis.  Chest tube output low.  CBC    Component Value Date/Time   WBC 9.4 02/20/2022 1632   RBC 3.29 (L) 02/20/2022 1632   HGB 10.4 (L) 02/20/2022 1632   HGB 15.4 12/02/2021 1245   HCT 30.1 (L) 02/20/2022 1632   HCT 45.9 12/02/2021 1245   PLT 171 02/20/2022 1632   PLT 322 12/02/2021 1245   MCV 91.5 02/20/2022 1632   MCV 92 12/02/2021 1245   MCH 31.6 02/20/2022 1632   MCHC 34.6 02/20/2022 1632   RDW 13.4 02/20/2022 1632   RDW 14.4 12/02/2021 1245   LYMPHSABS 1.9 01/11/2022 0150   MONOABS 0.6 01/11/2022 0150   EOSABS 0.1 01/11/2022 0150   BASOSABS 0.1 01/11/2022 0150   BMET pending this evening.

## 2022-02-20 NOTE — Discharge Summary (Addendum)
[address]       [place], [address]             [phone]    Physician Discharge Summary  Patient ID: Craig Flores MRN: [id number] DOB/AGE: [birth year] 57 y.o.  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 02/26/2020   Moderate episode of recurrent major depressive disorder ([place]) 11/23/2019   Encounter for monitoring Coumadin therapy 09/28/2019   ST elevation myocardial infarction (STEMI) ([place])    Apical mural thrombus    Ischemic cardiomyopathy    Mixed hyperlipidemia    Acute systolic CHF (congestive heart failure) (HCC)    Cardiogenic shock (HCC)    Acute ST elevation myocardial infarction (STEMI) of anterior wall ([place]) 08/28/2019   Major depressive disorder, recurrent episode, severe with anxious distress ([place])    Alcohol abuse    Alcohol dependence with withdrawal, uncomplicated ([place]) 11/20/8525   Generalized anxiety disorder 04/26/2014   Suicidal ideations 04/26/2014   Depression, major, single episode, severe ([place]) 04/26/2014   Substance induced mood disorder ([place]) 04/26/2014   Discharge Diagnoses:  Patient Active Problem List   Diagnosis Date Noted   S/P aortic valve repair 02/19/2022   Severe aortic stenosis 12/12/2021   Benign neoplasm of descending colon    Rectal bleeding 04/30/2021   Diarrhea 04/30/2021   Family history of colon cancer 04/30/2021   Chronic anticoagulation 04/30/2021   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 02/26/2020   Moderate episode of recurrent major depressive disorder ([place]) 11/23/2019   Encounter for monitoring Coumadin therapy 09/28/2019    ST elevation myocardial infarction (STEMI) ([place])    Apical mural thrombus    Ischemic cardiomyopathy    Mixed hyperlipidemia    Acute systolic CHF (congestive heart failure) (HCC)    Cardiogenic shock (HCC)    Acute ST elevation myocardial infarction (STEMI) of anterior wall ([place]) 08/28/2019   Major depressive disorder, recurrent episode, severe with anxious distress ([place])    Alcohol abuse    Alcohol dependence with withdrawal, uncomplicated ([place]) 78/24/2353   Generalized anxiety disorder 04/26/2014   Suicidal ideations 04/26/2014   Depression, major, single episode, severe ([place]) 04/26/2014   Substance induced mood disorder ([place]) 04/26/2014   Discharged Condition: good   [name], MD Pcp, No   History of Present Illness:     Pt is a very pleasant 57 yo wm who suffered an anterior MI several years ago that has led to ischemic cardiomyopathy with an EF of 25-30% that has also been noted with chronic LV thrombus treated with coumadin. Pt has had a defibrillator placed over the past two years but has been having increasing DOE and dizziness the past several months. Pt had echo confirming LV dysfunction with EF 25-30% but now with severe low flow low gradient AS with a calculated valve area of .88cm2 and a mean gradient of 24mHg. He as what appears to be a bicuspid valve. Secondary to his LV thrombus and his age it was felt pt may best be served with SAVR. Pt has had cath with occluded chronic LAD without target and moderate RCA stenosis.Following review of the patient and  his studies Dr Lavonna Monarch recommended admission for AVR with mechanical valve.   Hospital course:  The patent was admitted electively and taken to the OR and underwent AVR with Regent St. Jude #23 mechanical prosthesis. He tolerated the procedure well and was taken to the SICU in stable condition.  The patient was extubated the evening of surgery.  He was weaned off Neo-synephrine as hemodynamics allowed.  He was  started on coumadin for his mechanical AVR.  Daily PT/INR were monitored. His chest tubes and pacing wires were removed without difficulty. He was transitioned off the Insulin drip. His pre op HGA1C was 5.8. Will stop accu checks and SS PRN upon transfer. Will provide nutrition information with paperwork. He will need further surveillance of HGA1C with medical doctor after discharge. He was felt surgically stable for transfer from the ICU to 4E for further convalescence on postop day 2.  Diet and activity were advanced and well-tolerated.  Coumadin was continued for the [facility] mechanical aortic valve.  The Coumadin dosing was managed by pharmacy.  He had mild tachycardia postoperatively.  The metoprolol was titrated accordingly.  The patient was not responding to coumadin.  However he was not receiving his home dose.  This regimen was started.  His INR is 1.2 and we will plan to continue Lovenox injections until INR is therapeutic at 2.0.  He remains in NSR.  His surgical incisions are healing without evidence of infection.  He is ambulating without difficulty.  He is medically stable for discharge home today.    Consults: None  Significant Diagnostic Studies: cardiac graphics:   Echocardiogram:        ECHOCARDIOGRAM REPORT       Patient Name:   Craig Flores Date of Exam: 11/20/2021 Medical Rec #:  [id number]        Height:       69.0 in Accession #:    [id number]       Weight:       187.4 lb Date of Birth:  Dec 05, 1965         BSA:          2.010 m Patient Age:    57 years         BP:           91/54 mmHg Patient Gender: M                HR:           68 bpm. Exam Location:  Outpatient  Procedure: 2D Echo, 3D Echo, Cardiac Doppler and Color Doppler  Indications:    CHF-Acute Diastolic K34.91   History:        Patient has prior history of Echocardiogram examinations, most                 recent 09/25/2021. CAD and Previous Myocardial Infarction,                 Pacemaker; Risk  Factors:Dyslipidemia, Family History of Coronary                 Artery Disease and Current Smoker. 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   Sonographer:    Darlina Sicilian RDCS Referring Phys: [place]    1. Left ventricular ejection fraction, by estimation, is 25 to 30%. The left ventricle has severely decreased function. The  left ventricle demonstrates regional wall motion abnormalities with mid to apical inferoseptal and anteroseptal akinesis, akinesis of the apical anterior/inferior/lateral walls and the true apex. There was chronic-appearing LV thrombus at the apex. There is mild left ventricular hypertrophy. Left ventricular diastolic parameters are consistent with Grade I diastolic dysfunction (impaired relaxation).  2. Right ventricular systolic function is normal. The right ventricular size is normal.  3. The mitral valve is normal in structure. Trivial mitral valve regurgitation. No evidence of mitral stenosis.  4. The aortic valve is bicuspid. There is moderate calcification of the aortic valve. Aortic valve regurgitation is not visualized. Severe low flow low gradient aortic valve stenosis. Aortic valve area, by VTI measures 0.88 cm. Aortic valve mean gradient measures 27.0 mmHg.  5. Aortic dilatation noted. There is mild dilatation of the ascending aorta, measuring 37 mm.  6. The inferior vena cava is normal in size with greater than 50% respiratory variability, suggesting right atrial pressure of 3 mmHg.   Treatments: surgery:    CARDIOVASCULAR SURGERY OPERATIVE NOTE   02/19/2022 Kamil Mchaffie [id number]   Surgeon:  Everlena Cooper, MD   First Assistant: Jadene Pierini [facility]                                 An experienced assistant was required given the complexity of this surgery and the standard of surgical care. The assistant was needed for exposure, dissection, suctioning, retraction of delicate tissues and  sutures, instrument exchange and for overall help during this procedure.       Preoperative Diagnosis:  Severe aortic stenosis     Postoperative Diagnosis:  Same     Procedure:   Median Sternotomy Extracorporeal circulation 3.   Aortic valve replacement using a 79m ST Jude Mechanical  Regent  valve.([id number]         Discharge Exam: Blood pressure 121/74, pulse 98, temperature 98.1 F (36.7 C), temperature source Oral, resp. rate 19, height '5\' 9"'$  (1.753 m), weight 87.6 kg, SpO2 94 %.  General appearance: alert, cooperative, and no distress Heart: regular rate and rhythm Lungs: clear to auscultation bilaterally Abdomen: soft, non-tender; bowel sounds normal; no masses,  no organomegaly Extremities: edema trace Wound: clean and dry   Discharge Medications:  The patient has been discharged on:   1.Beta Blocker:  Yes [ X  ]                              No   [   ]                              If No, reason:  2.Ace Inhibitor/ARB: Yes [   ]                                     No  [ X   ]                                     If No, reason:  3.Statin:   Yes [ X  ]                  No  [   ]  If No, reason:  4.Ecasa:  Yes  [  X ]                  No   [   ]                  If No, reason:  Patient had ACS upon admission: No  Plavix/P2Y12 inhibitor: Yes [   ]                                      No  [ X  ]     Discharge Instructions     Amb Referral to Cardiac Rehabilitation   Complete by: As directed    Diagnosis: Valve Replacement   Valve: Aortic   After initial evaluation and assessments completed: Virtual Based Care may be provided alone or in conjunction with Phase 2 Cardiac Rehab based on patient barriers.: Yes   Intensive Cardiac Rehabilitation (ICR) [place] location only OR Traditional Cardiac Rehabilitation (TCR) *If criteria for ICR are not met will enroll in TCR [facility] only): Yes      Allergies as of 02/24/2022   No Known Allergies       Medication List     STOP taking these medications    carvedilol 12.5 MG tablet Commonly known as: COREG   dapagliflozin propanediol 10 MG Tabs tablet Commonly known as: Farxiga   Entresto 97-103 MG Generic drug: sacubitril-valsartan   spironolactone 25 MG tablet Commonly known as: ALDACTONE   varenicline 0.5 MG tablet Commonly known as: Chantix       TAKE these medications     Coreg 6.125 mg Tablet Take 1 tablet by mouth twice daily  acetaminophen 500 MG tablet Commonly known as: TYLENOL Take 500 mg by mouth every 6 (six) hours as needed for headache.   aspirin 81 MG chewable tablet Chew 1 tablet (81 mg total) by mouth daily.   atorvastatin 80 MG tablet Commonly known as: LIPITOR Take 1 tablet (80 mg total) by mouth daily.   digoxin 0.125 MG tablet Commonly known as: LANOXIN Take 1/2 tablet (0.0625 mg total) by mouth daily.   enoxaparin 80 MG/0.8ML injection Commonly known as: LOVENOX Inject 0.8 mLs (80 mg total) into the skin every 12 (twelve) hours.   ezetimibe 10 MG tablet Commonly known as: ZETIA Take 1 tablet (10 mg total) by mouth daily.   fenofibrate 160 MG tablet Take 1 tablet (160 mg total) by mouth daily. What changed:  medication strength how much to take   furosemide 20 MG tablet Commonly known as: LASIX Take 1 tablet (20 mg total) by mouth daily as needed for edema. What changed:  when to take this reasons to take this   furosemide 40 MG tablet Commonly known as: LASIX Take 1 tablet (40 mg total) by mouth daily for 7 days What changed: You were already taking a medication with the same name, and this prescription was added. Make sure you understand how and when to take each.   hydrOXYzine 25 MG tablet Commonly known as: ATARAX Take 1 tablet (25 mg total) by mouth 3 (three) times daily as needed.   melatonin 5 MG Tabs Take 10 mg by mouth at bedtime.   mirtazapine 45 MG tablet Commonly known as: REMERON Take 1 tablet (45  mg total) by mouth at bedtime.   multivitamin with minerals Tabs tablet Take 1 tablet by  mouth daily.   omega-3 acid ethyl esters 1 g capsule Commonly known as: LOVAZA TAKE 2 CAPSULES (2 GRAMS TOTAL) BY MOUTH TWO TIMES DAILY.   omeprazole 20 MG capsule Commonly known as: PRILOSEC Take 20 mg by mouth daily before breakfast.   oxyCODONE 5 MG immediate release tablet Commonly known as: Oxy IR/ROXICODONE Take 1 tablet (5 mg total) by mouth every 4 (four) hours as needed for severe pain.   potassium chloride SA 20 MEQ tablet Commonly known as: KLOR-CON M Take 1 tablet (20 mEq total) by mouth daily for 7 days   PROBIOTIC DAILY PO Take 1 capsule by mouth with breakfast, with lunch, and with evening meal.   sertraline 100 MG tablet Commonly known as: ZOLOFT Take 1.5 tablets (150 mg total) by mouth daily.   SILDENAFIL CITRATE PO Take 45-90 mg by mouth daily as needed (ED). Take 1 - 2 tablets 30 to 45 min before sexual activity as needed   warfarin 5 MG tablet Commonly known as: COUMADIN Take as directed. If you are unsure how to take this medication, talk to your nurse or doctor. Original instructions: Take 1.5-2 tablets (7.5-10 mg total) by mouth See admin instructions. Take 2 tablets (10 mg) by mouth on Sundays, Mondays, Wednesdays & Fridays in the evening. Take 1.5 tablet (7.5 mg) by mouth on Tuesdays, Thursdays & Saturdays in the evening.        Follow-up Information     [facility]  Follow up on 03/03/2022.   Specialty: Cardiothoracic Surgery Why: Appointment is at 2:00 Contact information: [place], [place] Follow up on 03/03/2022.   Why: Please get CXR 1 hour prior to your appointment at Dr. Nicholos Johns Office Contact information: [place] at [facility]  Follow up on 02/26/2022.   Specialty: Cardiology Why: Appointment is at for PT/INR Check at 9:30 Contact information: [place] [id number] [place] [phone]                Signed:  Ellwood Handler, PA-C  02/24/2022, 1:12 PM

## 2022-02-20 NOTE — Progress Notes (Signed)
NAME:  Craig Flores, MRN:  [id number], DOB:  [birth year], LOS: 1 ADMISSION DATE:  02/19/2022, CONSULTATION DATE:  1/24 REFERRING MD:  Lavonna Monarch, CHIEF COMPLAINT:  post -AVR  History of Present Illness:  [name] Has is a 57 year old gentleman with a history of coronary artery disease with ischemic cardiomyopathy and HFrEF, bicuspid aortic valve with severe aortic stenosis, GERD who presented for aortic valve replacement.  He developed shortness of breath and was evaluated by cardiology.  Although his gradient was low echocardiographically this was felt to be a low-flow low gradient stenosis due to HFrEF.  Baseline LVEF 25 to 30%, normal RV function.  No blood products given in the operating room. Post-operatively he is on milrinone and epinephrine. PCCM consulted for post-op medical management.  Pertinent  Medical History  HFrEF 2/2 ICM AS, bicuspid valve GERD  Significant Hospital Events: Including procedures, antibiotic start and stop dates in addition to other pertinent events   1/24 mechanical AVR on bypass  Interim History / Subjective:  Feels well, pain is controlled. No nausea.  Objective   Blood pressure 113/68, pulse 80, temperature 98.8 F (37.1 C), temperature source Oral, resp. rate (!) 27, height '5\' 9"'$  (1.753 m), weight 90.3 kg, SpO2 96 %. PAP: (26-47)/(14-21) 31/18 CO:  [3.9 L/min-8.7 L/min] 8.7 L/min CI:  [1.9 L/min/m2-4.4 L/min/m2] 4.4 L/min/m2  Vent Mode: PSV;CPAP FiO2 (%):  [40 %-50 %] 40 % Set Rate:  [4 bmp-20 bmp] 4 bmp Vt Set:  [560 mL] 560 mL PEEP:  [5 cmH20] 5 cmH20 Pressure Support:  [10 cmH20] 10 cmH20 Plateau Pressure:  [17 cmH20] 17 cmH20   Intake/Output Summary (Last 24 hours) at 02/20/2022 0728 Last data filed at 02/20/2022 0700 Gross per 24 hour  Intake 3634.05 ml  Output 2445 ml  Net 1189.05 ml    Filed Weights   02/19/22 0537 02/20/22 0500  Weight: 84.4 kg 90.3 kg    Examination: General: middle aged man lying in bed in NAD HENT: [place]/AT,  eyes anicteric Lungs:  breathing comfortably on [place], CTAB Cardiovascular: S1S2, RRR. Minimal serosanguinous output from chest tube drains. Abdomen: soft, NT Extremities: no edema, no cyanosis Neuro: awake, alert, moving all extremities   Na+  131 BUN 15 Cr 1.07 WBC 7.5 H/H 9.7/ 29.4 Post-op labs pending CXR personally reviewed:  bibasilar atelectasis, AICD EKG: anterolateral TWI   Resolved Hospital Problem list     Assessment & Plan:   Severe AS, bicuspid aortic valve s/p mechanical AVR -hold warfarin until stable w/o post-op bleeding -complete post-op antibiotics -post-op pain control per protocol  Chronic HFrEF due to ICM AICD in place Previous history of LV thrombus -start warfarin tonight, dosing per pharmacy, no bridging needed -Hold GDMT for now, reintroduce slowly. Start metoprolol today. As BP rises can switch to coreg. Can start PTA digoxin today-- dosing per pharmacy.  -wait to resume Entresto -monitor on tele -AICD has ack to appropriate settings post-op  Post-op vent management Pulmonary edema on CXR -rapid wean protocol -wean supplemental O2 as able -may need diuretics  HLD -statin, zetia, fenofibrate, lovaza  Hyperglycemia -transition to basal bolus insulin- levemir 5 units BID -SSI PRN -goal BG 140-180  GERD -PPI   Best Practice (right click and "Reselect all SmartList Selections" daily)   Diet/type: Regular consistency (see orders) DVT prophylaxis: LMWH GI prophylaxis: PPI Lines: Central line and yes and it is still needed Foley:  Yes, and it is no longer needed and removal ordered  Code Status:  full code Last  date of multidisciplinary goals of care discussion [ per primary]  Labs   CBC: Recent Labs  Lab 02/17/22 1224 02/19/22 0814 02/19/22 0954 02/19/22 1001 02/19/22 1126 02/19/22 1129 02/19/22 1418 02/19/22 1552 02/19/22 1655 02/20/22 0307  WBC 5.9  --   --   --  13.6*  --   --   --  9.4 7.5  HGB 14.4   < > 10.0*   < >  10.1* 10.2* 9.9* 9.5* 10.5* 9.7*  HCT 41.8   < > 29.0*   < > 30.5* 30.0* 29.0* 28.0* 30.5* 29.4*  MCV 91.5  --   --   --  94.7  --   --   --  91.9 94.2  PLT 251  --  196  --  186  --   --   --  163 153   < > = values in this interval not displayed.     Basic Metabolic Panel: Recent Labs  Lab 02/17/22 1224 02/19/22 0814 02/19/22 0841 02/19/22 0858 02/19/22 0928 02/19/22 0937 02/19/22 1023 02/19/22 1129 02/19/22 1418 02/19/22 1552 02/19/22 1655 02/20/22 0307  NA 134*   < > 138   < > 138   < > 138 141 140 140 134* 131*  K 4.6   < > 4.5   < > 5.3*   < > 5.1 4.3 4.3 4.4 4.6 4.4  CL 105   < > 104  --  103  --  102  --   --   --  105 100  CO2 23  --   --   --   --   --   --   --   --   --  23 24  GLUCOSE 89   < > 103*  --  115*  --  141*  --   --   --  107* 118*  BUN 22*   < > 24*  --  24*  --  24*  --   --   --  17 15  CREATININE 1.01   < > 1.30*  --  1.10  --  1.10  --   --   --  1.01 1.07  CALCIUM 9.6  --   --   --   --   --   --   --   --   --  7.8* 7.8*  MG  --   --   --   --   --   --   --   --   --   --  3.1* 2.5*   < > = values in this interval not displayed.    GFR: Estimated Creatinine Clearance: 85.6 mL/min (by C-G formula based on SCr of 1.07 mg/dL). Recent Labs  Lab 02/17/22 1224 02/19/22 1126 02/19/22 1655 02/20/22 0307  WBC 5.9 13.6* 9.4 7.5     Liver Function Tests: Recent Labs  Lab 02/17/22 1224  AST 24  ALT 27  ALKPHOS 28*  BILITOT 0.5  PROT 6.6  ALBUMIN 4.3    No results for input(s): "LIPASE", "AMYLASE" in the last 168 hours. No results for input(s): "AMMONIA" in the last 168 hours.   Critical care time:       Julian Hy, DO 02/20/22 9:50 AM  Pulmonary & Critical Care  For contact information, see Amion. If no response to pager, please call PCCM consult pager. After hours, 7PM- 7AM, please call [place].

## 2022-02-20 NOTE — Progress Notes (Signed)
[address]       [place] [address]             [phone]      1 Day Post-Op  Procedure(s) (LRB): AORTIC VALVE REPLACEMENT (AVR) 67m SJM REGENT MECHANICAL AORTIC VALVE (N/A) TRANSESOPHAGEAL ECHOCARDIOGRAM (TEE) (N/A)  Total Length of Stay:  LOS: 1 day   SUBJECTIVE: Feels well Ended back on neo last night  Vitals:   02/20/22 0700 02/20/22 0715  BP: 104/67 113/68  Pulse: 78 80  Resp: (!) 21 (!) 27  Temp:    SpO2: 97% 96%    Intake/Output      01/24 0701 01/25 0700 01/25 0701 01/26 0700   I.V. (mL/kg) 967.7 (10.7)    Blood 480    IV Piggyback 2186.4    Total Intake(mL/kg) 3634.1 (40.2)    Urine (mL/kg/hr) 1645 (0.8)    Chest Tube 800    Total Output 2445    Net +1189.1             sodium chloride Stopped (02/19/22 1702)   sodium chloride     sodium chloride 10 mL/hr at 02/20/22 0700   albumin human Stopped (02/19/22 1806)    ceFAZolin (ANCEF) IV Stopped (02/20/22 0541)   dexmedetomidine (PRECEDEX) IV infusion 0.2 mcg/kg/hr (02/19/22 2330)   epinephrine Stopped (02/19/22 1630)   famotidine (PEPCID) IV Stopped (02/19/22 1202)   lactated ringers     lactated ringers     lactated ringers 20 mL/hr at 02/20/22 0700   milrinone Stopped (02/19/22 1434)   nitroGLYCERIN     phenylephrine (NEO-SYNEPHRINE) Adult infusion 5 mcg/min (02/20/22 0700)    CBC    Component Value Date/Time   WBC 7.5 02/20/2022 0307   RBC 3.12 (L) 02/20/2022 0307   HGB 9.7 (L) 02/20/2022 0307   HGB 15.4 12/02/2021 1245   HCT 29.4 (L) 02/20/2022 0307   HCT 45.9 12/02/2021 1245   PLT 153 02/20/2022 0307   PLT 322 12/02/2021 1245   MCV 94.2 02/20/2022 0307   MCV 92 12/02/2021 1245   MCH 31.1 02/20/2022 0307   MCHC 33.0 02/20/2022 0307   RDW 13.4 02/20/2022 0307   RDW 14.4 12/02/2021 1245   LYMPHSABS 1.9 01/11/2022 0150   MONOABS 0.6 01/11/2022 0150   EOSABS 0.1 01/11/2022 0150   BASOSABS 0.1 01/11/2022 0150   CMP     Component Value Date/Time   NA  131 (L) 02/20/2022 0307   NA 140 12/02/2021 1245   K 4.4 02/20/2022 0307   CL 100 02/20/2022 0307   CO2 24 02/20/2022 0307   GLUCOSE 118 (H) 02/20/2022 0307   BUN 15 02/20/2022 0307   BUN 23 12/02/2021 1245   CREATININE 1.07 02/20/2022 0307   CALCIUM 7.8 (L) 02/20/2022 0307   PROT 6.6 02/17/2022 1224   ALBUMIN 4.3 02/17/2022 1224   AST 24 02/17/2022 1224   ALT 27 02/17/2022 1224   ALKPHOS 28 (L) 02/17/2022 1224   BILITOT 0.5 02/17/2022 1224   GFRNONAA >60 02/20/2022 0307   GFRAA 113 11/24/2019 1320   ABG    Component Value Date/Time   PHART 7.304 (L) 02/19/2022 1552   PCO2ART 45.9 02/19/2022 1552   PO2ART 86 02/19/2022 1552   HCO3 22.8 02/19/2022 1552   TCO2 24 02/19/2022 1552   ACIDBASEDEF 4.0 (H) 02/19/2022 1552   O2SAT 95 02/19/2022 1552   CBG (last 3)  Recent Labs    02/19/22 2023 02/19/22 2359 02/20/22 0351  GLUCAP 133* 109* 108*   EXAM Lungs:clear Card: RR Ext: warm Neuro: alert and oriented  ASSESSMENT: POD#1 sp AVR mechanical Doing well. Wean neo Will dc pws this am Check to remove chest tubes later today Coumadin tonight Ambulate Leave in unit today   Coralie Common, MD 02/20/2022

## 2022-02-20 NOTE — Progress Notes (Signed)
Patient assisted back to bed. Pacing wire dressing removed and site care completed. Wires removed with no complications noted with wires intact upon removal. Patient tolerated well. Post-removal ECG remains NSR with rare PVC's noted. Site dressed. Patient resting comfortably.

## 2022-02-20 NOTE — Discharge Instructions (Addendum)
Discharge Instructions:  1. You may shower, please wash incisions daily with soap and water and keep dry.  If you wish to cover wounds with dressing you may do so but please keep clean and change daily.  No tub baths or swimming until incisions have completely healed.  If your incisions become red or develop any drainage please call our office at [phone]  2. No Driving until cleared by Dr. Nicholos Johns  office and you are no longer using narcotic pain medications  3. Monitor your weight daily.. Please use the same scale and weigh at same time... If you gain 5-10 lbs in 48 hours with associated lower extremity swelling, please contact our office at [phone]  4. Fever of 101.5 for at least 24 hours with no source, please contact our office at [phone]  5. Activity- up as tolerated, please walk at least 3 times per day.  Avoid strenuous activity, no lifting, pushing, or pulling with your arms over 8-10 lbs for a minimum of 6 weeks  6. If any questions or concerns arise, please do not hesitate to contact our office at [phone] Prediabetes Eating Plan Prediabetes is a condition that causes blood sugar (glucose) levels to be higher than normal. This increases the risk for developing type 2 diabetes (type 2 diabetes mellitus). Working with a health care provider or nutrition specialist (dietitian) to make diet and lifestyle changes can help prevent the onset of diabetes. These changes may help you: Control your blood glucose levels. Improve your cholesterol levels. Manage your blood pressure. What are tips for following this plan? Reading food labels Read food labels to check the amount of fat, salt (sodium), and sugar in prepackaged foods. Avoid foods that have: Saturated fats. Trans fats. Added sugars. Avoid foods that have more than 300 milligrams (mg) of sodium per serving. Limit your sodium intake to less than 2,300 mg each day. Shopping Avoid buying pre-made and processed  foods. Avoid buying drinks with added sugar. Cooking Cook with olive oil. Do not use butter, lard, or ghee. Bake, broil, grill, steam, or boil foods. Avoid frying. Meal planning  Work with your dietitian to create an eating plan that is right for you. This may include tracking how many calories you take in each day. Use a food diary, notebook, or mobile application to track what you eat at each meal. Consider following a Mediterranean diet. This includes: Eating several servings of fresh fruits and vegetables each day. Eating fish at least twice a week. Eating one serving each day of whole grains, beans, nuts, and seeds. Using olive oil instead of other fats. Limiting alcohol. Limiting red meat. Using nonfat or low-fat dairy products. Consider following a plant-based diet. This includes dietary choices that focus on eating mostly vegetables and fruit, grains, beans, nuts, and seeds. If you have high blood pressure, you may need to limit your sodium intake or follow a diet such as the DASH (Dietary Approaches to Stop Hypertension) eating plan. The DASH diet aims to lower high blood pressure. Lifestyle Set weight loss goals with help from your health care team. It is recommended that most people with prediabetes lose 7% of their body weight. Exercise for at least 30 minutes 5 or more days a week. Attend a support group or seek support from a mental health counselor. Take over-the-counter and prescription medicines only as told by your health care provider. What foods are recommended? Fruits Berries. Bananas. Apples. Oranges. Grapes. Papaya. Mango. Pomegranate. Kiwi. Grapefruit. Cherries. Vegetables Lettuce. Spinach.  Peas. Beets. Cauliflower. Cabbage. Broccoli. Carrots. Tomatoes. Squash. Eggplant. Herbs. Peppers. Onions. Cucumbers. Brussels sprouts. Grains Whole grains, such as whole-wheat or whole-grain breads, crackers, cereals, and pasta. Unsweetened oatmeal. Bulgur. Barley. Quinoa.  Brown rice. Corn or whole-wheat flour tortillas or taco shells. Meats and other proteins Seafood. Poultry without skin. Lean cuts of pork and beef. Tofu. Eggs. Nuts. Beans. Dairy Low-fat or fat-free dairy products, such as yogurt, cottage cheese, and cheese. Beverages Water. Tea. Coffee. Sugar-free or diet soda. Seltzer water. Low-fat or nonfat milk. Milk alternatives, such as soy or almond milk. Fats and oils Olive oil. Canola oil. Sunflower oil. Grapeseed oil. Avocado. Walnuts. Sweets and desserts Sugar-free or low-fat pudding. Sugar-free or low-fat ice cream and other frozen treats. Seasonings and condiments Herbs. Sodium-free spices. Mustard. Relish. Low-salt, low-sugar ketchup. Low-salt, low-sugar barbecue sauce. Low-fat or fat-free mayonnaise. The items listed above may not be a complete list of recommended foods and beverages. Contact a dietitian for more information. What foods are not recommended? Fruits Fruits canned with syrup. Vegetables Canned vegetables. Frozen vegetables with butter or cream sauce. Grains Refined white flour and flour products, such as bread, pasta, snack foods, and cereals. Meats and other proteins Fatty cuts of meat. Poultry with skin. Breaded or fried meat. Processed meats. Dairy Full-fat yogurt, cheese, or milk. Beverages Sweetened drinks, such as iced tea and soda. Fats and oils Butter. Lard. Ghee. Sweets and desserts Baked goods, such as cake, cupcakes, pastries, cookies, and cheesecake. Seasonings and condiments Spice mixes with added salt. Ketchup. Barbecue sauce. Mayonnaise. The items listed above may not be a complete list of foods and beverages that are not recommended. Contact a dietitian for more information. Where to find more information American Diabetes Association: www.diabetes.org Summary You may need to make diet and lifestyle changes to help prevent the onset of diabetes. These changes can help you control blood sugar, improve  cholesterol levels, and manage blood pressure. Set weight loss goals with help from your health care team. It is recommended that most people with prediabetes lose 7% of their body weight. Consider following a Mediterranean diet. This includes eating plenty of fresh fruits and vegetables, whole grains, beans, nuts, seeds, fish, and low-fat dairy, and using olive oil instead of other fats. This information is not intended to replace advice given to you by your health care provider. Make sure you discuss any questions you have with your health care provider. Document Revised: 04/14/2019 Document Reviewed: 04/14/2019 Elsevier Patient Education  [place].

## 2022-02-21 ENCOUNTER — Inpatient Hospital Stay (HOSPITAL_COMMUNITY): Payer: Medicaid Other

## 2022-02-21 LAB — PROTIME-INR
INR: 1.2 (ref 0.8–1.2)
Prothrombin Time: 15.5 seconds — ABNORMAL HIGH (ref 11.4–15.2)

## 2022-02-21 LAB — GLUCOSE, CAPILLARY
Glucose-Capillary: 113 mg/dL — ABNORMAL HIGH (ref 70–99)
Glucose-Capillary: 110 mg/dL — ABNORMAL HIGH (ref 70–99)
Glucose-Capillary: 108 mg/dL — ABNORMAL HIGH (ref 70–99)
Glucose-Capillary: 141 mg/dL — ABNORMAL HIGH (ref 70–99)

## 2022-02-21 LAB — CBC
HCT: 27.2 % — ABNORMAL LOW (ref 39.0–52.0)
Hemoglobin: 9.3 g/dL — ABNORMAL LOW (ref 13.0–17.0)
MCH: 31.6 pg (ref 26.0–34.0)
MCHC: 34.2 g/dL (ref 30.0–36.0)
MCV: 92.5 fL (ref 80.0–100.0)
Platelets: 148 10*3/uL — ABNORMAL LOW (ref 150–400)
RBC: 2.94 MIL/uL — ABNORMAL LOW (ref 4.22–5.81)
RDW: 13.6 % (ref 11.5–15.5)
WBC: 7 10*3/uL (ref 4.0–10.5)
nRBC: 0 % (ref 0.0–0.2)

## 2022-02-21 LAB — BASIC METABOLIC PANEL
Anion gap: 6 (ref 5–15)
BUN: 11 mg/dL (ref 6–20)
CO2: 28 mmol/L (ref 22–32)
Calcium: 8.4 mg/dL — ABNORMAL LOW (ref 8.9–10.3)
Chloride: 99 mmol/L (ref 98–111)
Creatinine, Ser: 0.95 mg/dL (ref 0.61–1.24)
GFR, Estimated: >60 mL/min (ref >60)
Glucose, Bld: 101 mg/dL — ABNORMAL HIGH (ref 70–99)
Potassium: 3.4 mmol/L — ABNORMAL LOW (ref 3.5–5.1)
Sodium: 133 mmol/L — ABNORMAL LOW (ref 135–145)

## 2022-02-21 MED ORDER — POTASSIUM CHLORIDE CRYS ER 20 MEQ PO TBCR
20.0000 meq | EXTENDED_RELEASE_TABLET | ORAL | Status: AC
  Administered 2022-02-21 (×3): 20 meq via ORAL
  Filled 2022-02-21 (×3): qty 1

## 2022-02-21 MED ORDER — FUROSEMIDE 40 MG PO TABS
40.0000 mg | ORAL_TABLET | Freq: Two times a day (BID) | ORAL | Status: DC
  Administered 2022-02-21 – 2022-02-24 (×7): 40 mg via ORAL
  Filled 2022-02-21 (×7): qty 1

## 2022-02-21 MED ORDER — WARFARIN SODIUM 5 MG PO TABS
5.0000 mg | ORAL_TABLET | Freq: Once | ORAL | Status: AC
  Administered 2022-02-21: 5 mg via ORAL
  Filled 2022-02-21: qty 1

## 2022-02-21 MED ORDER — POTASSIUM CHLORIDE CRYS ER 20 MEQ PO TBCR
20.0000 meq | EXTENDED_RELEASE_TABLET | Freq: Two times a day (BID) | ORAL | Status: DC
  Administered 2022-02-21 – 2022-02-24 (×6): 20 meq via ORAL
  Filled 2022-02-21 (×6): qty 1

## 2022-02-21 NOTE — Progress Notes (Signed)
[address]       [place] [address]             [phone]      2 Days Post-Op  Procedure(s) (LRB): AORTIC VALVE REPLACEMENT (AVR) 56m SJM [facility]ENT MECHANICAL AORTIC VALVE (N/A) TRANSESOPHAGEAL ECHOCARDIOGRAM (TEE) (N/A)  Total Length of Stay:  LOS: 2 days   SUBJECTIVE: Feels good, had some sleep Ambulated yesterday  Vitals:   02/21/22 0600 02/21/22 0700  BP: (!) 146/68 124/80  Pulse:  95  Resp: (!) 25 (!) 25  Temp:    SpO2:  92%    Intake/Output      01/25 0701 01/26 0700 01/26 0701 01/27 0700   P.O. 1000    I.V. (mL/kg) 82.9 (0.9)    Blood     IV Piggyback 311.7    Total Intake(mL/kg) 1394.6 (15.8)    Urine (mL/kg/hr) 2180 (1)    Stool 0    Chest Tube 230    Total Output 2410    Net -1015.4         Urine Occurrence 2 x    Stool Occurrence 3 x        sodium chloride Stopped (02/19/22 1702)   sodium chloride     sodium chloride Stopped (02/20/22 0901)   lactated ringers     lactated ringers Stopped (02/20/22 0828)    CBC    Component Value Date/Time   WBC 7.0 02/21/2022 0410   RBC 2.94 (L) 02/21/2022 0410   HGB 9.3 (L) 02/21/2022 0410   HGB 15.4 12/02/2021 1245   HCT 27.2 (L) 02/21/2022 0410   HCT 45.9 12/02/2021 1245   PLT 148 (L) 02/21/2022 0410   PLT 322 12/02/2021 1245   MCV 92.5 02/21/2022 0410   MCV 92 12/02/2021 1245   MCH 31.6 02/21/2022 0410   MCHC 34.2 02/21/2022 0410   RDW 13.6 02/21/2022 0410   RDW 14.4 12/02/2021 1245   LYMPHSABS 1.9 01/11/2022 0150   MONOABS 0.6 01/11/2022 0150   EOSABS 0.1 01/11/2022 0150   BASOSABS 0.1 01/11/2022 0150   CMP     Component Value Date/Time   NA 133 (L) 02/21/2022 0410   NA 140 12/02/2021 1245   K 3.4 (L) 02/21/2022 0410   CL 99 02/21/2022 0410   CO2 28 02/21/2022 0410   GLUCOSE 101 (H) 02/21/2022 0410   BUN 11 02/21/2022 0410   BUN 23 12/02/2021 1245   CREATININE 0.95 02/21/2022 0410   CALCIUM 8.4 (L) 02/21/2022 0410   PROT 6.6 02/17/2022 1224   ALBUMIN  4.3 02/17/2022 1224   AST 24 02/17/2022 1224   ALT 27 02/17/2022 1224   ALKPHOS 28 (L) 02/17/2022 1224   BILITOT 0.5 02/17/2022 1224   GFRNONAA >60 02/21/2022 0410   GFRAA 113 11/24/2019 1320   ABG    Component Value Date/Time   PHART 7.304 (L) 02/19/2022 1552   PCO2ART 45.9 02/19/2022 1552   PO2ART 86 02/19/2022 1552   HCO3 22.8 02/19/2022 1552   TCO2 24 02/19/2022 1552   ACIDBASEDEF 4.0 (H) 02/19/2022 1552   O2SAT 95 02/19/2022 1552   CBG (last 3)  Recent Labs    02/20/22 1631 02/20/22 2116 02/21/22 0638  GLUCAP 129* 120* 113*  EXAM Lungs: decreased at bases Card: RR with sharp valve sound Ext: warm  Neuro: alert  ASSESSMENT: POD #2 sp AVR (mechanical) Hemodynamics OK. Will allow to go upstairs to floor DC chest tubes Diuresis Continue coumadin to INR  2.5 to 3. Was no LV thrombus noted on intra-op TEE   Coralie Common, MD 02/21/2022

## 2022-02-21 NOTE — Progress Notes (Signed)
ANTICOAGULATION CONSULT NOTE  Pharmacy Consult for warfarin Indication:  hx LV thrombus, mechanical AVR  No Known Allergies  Patient Measurements: Height: '5\' 9"'$  (175.3 cm) Weight: 88.4 kg (194 lb 14.2 oz) IBW/kg (Calculated) : 70.7 Heparin Dosing Weight: 84.4 kg  Vital Signs: Temp: 98.5 F (36.9 C) (01/26 1336) Temp Source: Oral (01/26 1336) BP: 105/64 (01/26 1336) Pulse Rate: 92 (01/26 1336)  Labs: Recent Labs    02/19/22 0639 02/19/22 0814 02/19/22 1126 02/19/22 1129 02/20/22 0307 02/20/22 1632 02/21/22 0410  HGB  --    < > 10.1*   < > 9.7* 10.4* 9.3*  HCT  --    < > 30.5*   < > 29.4* 30.1* 27.2*  PLT  --    < > 186   < > 153 171 148*  APTT  --   --  28  --   --   --   --   LABPROT 13.2  --  16.6*  --   --   --  15.5*  INR 1.0  --  1.4*  --   --   --  1.2  CREATININE  --    < >  --    < > 1.07 1.09 0.95   < > = values in this interval not displayed.     Estimated Creatinine Clearance: 95.5 mL/min (by C-G formula based on SCr of 0.95 mg/dL).   Medical History: Past Medical History:  Diagnosis Date   Acid reflux    AICD (automatic cardioverter/defibrillator) present    Anxiety    Aortic stenosis with bicuspid valve    CHF (congestive heart failure) (HCC)    Depression    Dyspnea    GERD (gastroesophageal reflux disease)    HLD (hyperlipidemia)    Ischemic cardiomyopathy    Migraines    Myocardial infarction ([place]) 08/28/2019    Medications:  Scheduled:   acetaminophen  1,000 mg Oral Q6H   Or   acetaminophen (TYLENOL) oral liquid 160 mg/5 mL  1,000 mg Per Tube Q6H   acetaminophen (TYLENOL) oral liquid 160 mg/5 mL  650 mg Per Tube Once   Or   acetaminophen  650 mg Rectal Once   aspirin EC  81 mg Oral Daily   atorvastatin  80 mg Oral Daily   bisacodyl  10 mg Oral Daily   Or   bisacodyl  10 mg Rectal Daily   Chlorhexidine Gluconate Cloth  6 each Topical Daily   digoxin  0.0625 mg Oral Daily   docusate sodium  200 mg Oral Daily   ezetimibe  10 mg  Oral Daily   fenofibrate  160 mg Oral Daily   furosemide  40 mg Oral BID   insulin aspart  0-24 Units Subcutaneous TID AC & HS   insulin detemir  5 Units Subcutaneous Q12H   metoprolol tartrate  12.5 mg Oral BID   Or   metoprolol tartrate  12.5 mg Per Tube BID   omega-3 acid ethyl esters  2 g Oral BID   pantoprazole  40 mg Oral Daily   potassium chloride  20 mEq Oral Q4H   potassium chloride  20 mEq Oral BID   sodium chloride flush  3 mL Intravenous Q12H   Warfarin - Pharmacist Dosing Inpatient   Does not apply q1600    Assessment: 31 yom presenting for mechanical AVR - was on warfarin PTA for hx mural thrombus. Last dose of warfarin was 1/19 - was bridged with enoxaparin. PTA regimen  is 10 mg daily except 7.5 mg TThSat. -INR= 1.2 -per notes: no LV thrombus noted on intra-op TEE    Goal of Therapy:  INR= 2.5-3 Monitor platelets by anticoagulation protocol: Yes   Plan:  Warfarin 5 mg tonight  Daily PT/INR  Hildred Laser, PharmD Clinical Pharmacist **Pharmacist phone directory can now be found on [email or web address] ([email or web address]).  Listed under [place].  e

## 2022-02-22 LAB — BASIC METABOLIC PANEL
Anion gap: 8 (ref 5–15)
BUN: 18 mg/dL (ref 6–20)
CO2: 27 mmol/L (ref 22–32)
Calcium: 8.7 mg/dL — ABNORMAL LOW (ref 8.9–10.3)
Chloride: 100 mmol/L (ref 98–111)
Creatinine, Ser: 1.03 mg/dL (ref 0.61–1.24)
GFR, Estimated: >60 mL/min (ref >60)
Glucose, Bld: 95 mg/dL (ref 70–99)
Potassium: 3.8 mmol/L (ref 3.5–5.1)
Sodium: 135 mmol/L (ref 135–145)

## 2022-02-22 LAB — CBC
HCT: 26.3 % — ABNORMAL LOW (ref 39.0–52.0)
Hemoglobin: 8.8 g/dL — ABNORMAL LOW (ref 13.0–17.0)
MCH: 30.7 pg (ref 26.0–34.0)
MCHC: 33.5 g/dL (ref 30.0–36.0)
MCV: 91.6 fL (ref 80.0–100.0)
Platelets: 172 10*3/uL (ref 150–400)
RBC: 2.87 MIL/uL — ABNORMAL LOW (ref 4.22–5.81)
RDW: 13.4 % (ref 11.5–15.5)
WBC: 7.7 10*3/uL (ref 4.0–10.5)
nRBC: 0 % (ref 0.0–0.2)

## 2022-02-22 LAB — GLUCOSE, CAPILLARY
Glucose-Capillary: 127 mg/dL — ABNORMAL HIGH (ref 70–99)
Glucose-Capillary: 99 mg/dL (ref 70–99)
Glucose-Capillary: 104 mg/dL — ABNORMAL HIGH (ref 70–99)
Glucose-Capillary: 117 mg/dL — ABNORMAL HIGH (ref 70–99)

## 2022-02-22 LAB — PROTIME-INR
INR: 1.3 — ABNORMAL HIGH (ref 0.8–1.2)
Prothrombin Time: 15.6 seconds — ABNORMAL HIGH (ref 11.4–15.2)

## 2022-02-22 MED ORDER — WARFARIN SODIUM 5 MG PO TABS
7.5000 mg | ORAL_TABLET | Freq: Once | ORAL | Status: AC
  Administered 2022-02-22: 7.5 mg via ORAL
  Filled 2022-02-22: qty 1

## 2022-02-22 MED ORDER — ENOXAPARIN SODIUM 40 MG/0.4ML IJ SOSY
40.0000 mg | PREFILLED_SYRINGE | INTRAMUSCULAR | Status: DC
  Administered 2022-02-22 – 2022-02-24 (×3): 40 mg via SUBCUTANEOUS
  Filled 2022-02-22 (×4): qty 0.4

## 2022-02-22 NOTE — Progress Notes (Signed)
At bedside shift report, patient was sitting in the recliner, HR was in 110's. When Walked to the bathroom, HR got up to 120's. Pt ambulated about 800 ft with 6L o2 [address] [place]. HR got up to 130's. And HR came back down to 110's after getting back in bed. 02 lowered to 5 and 4l, patient's sat is holding up to 96%. Plan of care continues.

## 2022-02-22 NOTE — Progress Notes (Addendum)
[address]       [place] [address]             [phone]      3 Days Post-Op Procedure(s) (LRB): AORTIC VALVE REPLACEMENT (AVR) 29m SJM [facility] MECHANICAL AORTIC VALVE (N/A) TRANSESOPHAGEAL ECHOCARDIOGRAM (TEE) (N/A) Subjective:  Up in the bedside chair, progressing with mobility.  No new concerns.  BM prior to transfer to 4E.  Objective: Vital signs in last 24 hours: Temp:  [98 F (36.7 C)-98.8 F (37.1 C)] 98.3 F (36.8 C) (01/27 0721) Pulse Rate:  [84-113] 95 (01/27 0721) Cardiac Rhythm: Sinus tachycardia (01/26 1906) Resp:  [18-29] 22 (01/27 0721) BP: (100-121)/(61-86) 108/61 (01/27 0721) SpO2:  [92 %-100 %] 92 % (01/27 0721) Weight:  [87.6 kg] 87.6 kg (01/27 0342)     Intake/Output from previous day: 01/26 0701 - 01/27 0700 In: 720 [P.O.:720] Out: 2260 [Urine:2250; Chest Tube:10] Intake/Output this shift: No intake/output data recorded.  General appearance: alert, cooperative, and no distress Neurologic: intact Heart: SR / ST 110-120. Lungs: brath sounds clear and non-labored. Os weaned 4L-->2L/min over past 24 hours.  CXR yesterday showing RLL ATX.  Extremities: trace peripheral edema Wound: the sternotomy is covered with a dry Aquacel dressing.   Lab Results: Recent Labs    02/21/22 0410 02/22/22 0042  WBC 7.0 7.7  HGB 9.3* 8.8*  HCT 27.2* 26.3*  PLT 148* 172   BMET:  Recent Labs    02/21/22 0410 02/22/22 0042  NA 133* 135  K 3.4* 3.8  CL 99 100  CO2 28 27  GLUCOSE 101* 95  BUN 11 18  CREATININE 0.95 1.03  CALCIUM 8.4* 8.7*    PT/INR:  Recent Labs    02/22/22 0042  LABPROT 15.6*  INR 1.3*   ABG    Component Value Date/Time   PHART 7.304 (L) 02/19/2022 1552   HCO3 22.8 02/19/2022 1552   TCO2 24 02/19/2022 1552   ACIDBASEDEF 4.0 (H) 02/19/2022 1552   O2SAT 95 02/19/2022 1552   CBG (last 3)  Recent Labs    02/21/22 1630 02/21/22 2152 02/22/22 0600  GLUCAP 108* 141* 127*     Assessment/Plan: S/P Procedure(s) (LRB): AORTIC VALVE REPLACEMENT (AVR) 25mSJM REGENT MECHANICAL AORTIC VALVE (N/A) TRANSESOPHAGEAL ECHOCARDIOGRAM (TEE) (N/A)  -POD3 mechanical aortic valve replacement for severe AS with ischemic cardiomyopathy and bicuspid aortic valve  Stable BP, continues to have sinus tach up to 120's with minimal activity. SBP 100-110. On Warfarin, ASA ('81mg'$ ), atorvastatin, digoxin (0.'0625mg'$  daily, dosing per pharmacy), and low-dose metoprolol.  INR 1.3, pharmacy dosing the Coumadin. Pacer wires are out. Has ICD for ICM.    -H/O EtOH abuse, may be contributing to tachycardia.   -Volume excess- Wt 3kg+, diuresis with Lasix PO BID  -GI-BM prior to transfer. Tolerating PO's.  HEME- expected acute blood loss anemia. Stable, monitoring.   -PULM- hypoxia related to volume excess and ATX- diuresing, working on IS.   -ENDO- no h/o DM, CBG's well controlled.  On Levemir 5u [place] BID.  -DVT PPX- ambulate, add daily enoxaparin until INR approaching therapeutic.    LOS: 3 days    MyAntony [phone] 02/22/2022  Agree with above Doing well Continue diuresis Dispo planning  Joud Pettinato O Sundi Slevin

## 2022-02-22 NOTE — Progress Notes (Signed)
Weaned pt's o2 to 2l via [place]. Pt sating 95-98%. Breathing even and unlabored. Denies any pain or discomfort. HR stayed in 90-100's while sleeping after Pm meds. Plan of care continues.

## 2022-02-22 NOTE — Progress Notes (Signed)
CARDIAC REHAB PHASE I   PRE:  Rate/Rhythm: 104 ST  BP:  Sitting: 116/68   SaO2: 92 2L [place]  MODE:  Ambulation: 470 ft   POST:  Rate/Rhythm: 116 T  BP:  Sitting: 122/76   SaO2: 95 2L [place]  Pt received in recliner agreeable to amb and education. Pt ambulated in hallway independently with assistance pulling O2 tank. Sats remained 95-99% throughout, tolerated well with no s/sx. Following sternal precautions well.  Pt returned to recliner and educated on sternal precautions, risk factors, exercise guidelines, restrictions, nutrition, and orientation to CRP2. Referral to [facility], pt highly interested. All questions from pt answered and call bell left in reach.  [employer], MS 02/22/2022 9:19 AM

## 2022-02-22 NOTE — Progress Notes (Signed)
ANTICOAGULATION CONSULT NOTE  Pharmacy Consult for warfarin Indication:  hx LV thrombus, mechanical AVR  No Known Allergies  Patient Measurements: Height: '5\' 9"'$  (175.3 cm) Weight: 87.6 kg (193 lb 3.2 oz) IBW/kg (Calculated) : 70.7 Heparin Dosing Weight: 84.4 kg  Vital Signs: Temp: 98.3 F (36.8 C) (01/27 0721) Temp Source: Oral (01/27 0721) BP: 108/61 (01/27 0721) Pulse Rate: 95 (01/27 0721)  Labs: Recent Labs    02/19/22 1126 02/19/22 1129 02/20/22 1632 02/21/22 0410 02/22/22 0042  HGB 10.1*   < > 10.4* 9.3* 8.8*  HCT 30.5*   < > 30.1* 27.2* 26.3*  PLT 186   < > 171 148* 172  APTT 28  --   --   --   --   LABPROT 16.6*  --   --  15.5* 15.6*  INR 1.4*  --   --  1.2 1.3*  CREATININE  --    < > 1.09 0.95 1.03   < > = values in this interval not displayed.     Estimated Creatinine Clearance: 87.8 mL/min (by C-G formula based on SCr of 1.03 mg/dL).   Medical History: Past Medical History:  Diagnosis Date   Acid reflux    AICD (automatic cardioverter/defibrillator) present    Anxiety    Aortic stenosis with bicuspid valve    CHF (congestive heart failure) (HCC)    Depression    Dyspnea    GERD (gastroesophageal reflux disease)    HLD (hyperlipidemia)    Ischemic cardiomyopathy    Migraines    Myocardial infarction ([facility]) 08/28/2019    Medications:  Scheduled:   acetaminophen  1,000 mg Oral Q6H   Or   acetaminophen (TYLENOL) oral liquid 160 mg/5 mL  1,000 mg Per Tube Q6H   acetaminophen (TYLENOL) oral liquid 160 mg/5 mL  650 mg Per Tube Once   Or   acetaminophen  650 mg Rectal Once   aspirin EC  81 mg Oral Daily   atorvastatin  80 mg Oral Daily   bisacodyl  10 mg Oral Daily   Or   bisacodyl  10 mg Rectal Daily   Chlorhexidine Gluconate Cloth  6 each Topical Daily   digoxin  0.0625 mg Oral Daily   docusate sodium  200 mg Oral Daily   ezetimibe  10 mg Oral Daily   fenofibrate  160 mg Oral Daily   furosemide  40 mg Oral BID   insulin aspart  0-24  Units Subcutaneous TID AC & HS   insulin detemir  5 Units Subcutaneous Q12H   metoprolol tartrate  12.5 mg Oral BID   Or   metoprolol tartrate  12.5 mg Per Tube BID   omega-3 acid ethyl esters  2 g Oral BID   pantoprazole  40 mg Oral Daily   potassium chloride  20 mEq Oral BID   sodium chloride flush  3 mL Intravenous Q12H   Warfarin - Pharmacist Dosing Inpatient   Does not apply q1600    Assessment: Craig Flores presenting for mechanical AVR - was on warfarin PTA for hx mural thrombus. Last dose of warfarin was 1/19 - was bridged with enoxaparin. PTA regimen is 10 mg daily except 7.5 mg TThSat. Pharmacy consulted to restart warfarin regimen 1/25.   INR today remains subtherapeutic at 1.3. Hgb 8.8, plts 172--stable. No s/sx of bleeding reported. Per notes, no LV thrombus noted on intra-op TEE.   Goal of Therapy:  INR= 2.5-3 Monitor platelets by anticoagulation protocol: Yes   Plan:  Warfarin 7.5 mg x1 tonight  Daily PT/INR Monitor for s/sx of bleeding    Billey Gosling, PharmD PGY1 Pharmacy Resident 1/27/20248:40 AM

## 2022-02-23 LAB — BASIC METABOLIC PANEL
Anion gap: 8 (ref 5–15)
BUN: 20 mg/dL (ref 6–20)
CO2: 26 mmol/L (ref 22–32)
Calcium: 8.7 mg/dL — ABNORMAL LOW (ref 8.9–10.3)
Chloride: 100 mmol/L (ref 98–111)
Creatinine, Ser: 1.18 mg/dL (ref 0.61–1.24)
GFR, Estimated: >60 mL/min (ref >60)
Glucose, Bld: 98 mg/dL (ref 70–99)
Potassium: 3.8 mmol/L (ref 3.5–5.1)
Sodium: 134 mmol/L — ABNORMAL LOW (ref 135–145)

## 2022-02-23 LAB — GLUCOSE, CAPILLARY
Glucose-Capillary: 91 mg/dL (ref 70–99)
Glucose-Capillary: 100 mg/dL — ABNORMAL HIGH (ref 70–99)
Glucose-Capillary: 87 mg/dL (ref 70–99)

## 2022-02-23 LAB — PROTIME-INR
INR: 1.2 (ref 0.8–1.2)
Prothrombin Time: 15.3 seconds — ABNORMAL HIGH (ref 11.4–15.2)

## 2022-02-23 MED ORDER — WARFARIN SODIUM 5 MG PO TABS
9.0000 mg | ORAL_TABLET | Freq: Once | ORAL | Status: AC
  Administered 2022-02-23: 9 mg via ORAL
  Filled 2022-02-23: qty 1

## 2022-02-23 MED ORDER — METOPROLOL TARTRATE 25 MG/10 ML ORAL SUSPENSION
12.5000 mg | Freq: Two times a day (BID) | ORAL | Status: DC
  Filled 2022-02-23 (×2): qty 5

## 2022-02-23 MED ORDER — METOPROLOL TARTRATE 25 MG PO TABS
25.0000 mg | ORAL_TABLET | Freq: Two times a day (BID) | ORAL | Status: DC
  Administered 2022-02-23 – 2022-02-24 (×2): 25 mg via ORAL
  Filled 2022-02-23 (×2): qty 1

## 2022-02-23 NOTE — Progress Notes (Addendum)
[address]       [place] [address]             [phone]      4 Days Post-Op Procedure(s) (LRB): AORTIC VALVE REPLACEMENT (AVR) 34m SJM REGENT MECHANICAL AORTIC VALVE (N/A) TRANSESOPHAGEAL ECHOCARDIOGRAM (TEE) (N/A) Subjective:  Resting in bed, walked in the hall 3 times yesterday and again this morning.   No new concerns.  Now on RA.   Would like to plan for discharge tomorrow afternoon when he will have help available for transportation.   Objective: Vital signs in last 24 hours: Temp:  [97.8 F (36.6 C)-98.5 F (36.9 C)] 98.5 F (36.9 C) (01/28 0800) Pulse Rate:  [94-106] 98 (01/28 0800) Cardiac Rhythm: Sinus tachycardia (01/28 0830) Resp:  [17-22] 22 (01/28 0800) BP: (113-130)/(72-80) 116/73 (01/28 0800) SpO2:  [91 %-100 %] 91 % (01/28 0800) Weight:  [86.9 kg] 86.9 kg (01/28 0555)   Intake/Output from previous day: No intake/output data recorded. Intake/Output this shift: No intake/output data recorded.  General appearance: alert, cooperative, and no distress Neurologic: intact Heart: SR / ST 90-110 Lungs: breath sounds clear and non-labored. Now on RA with normal WOB and stable O2 sats Extremities: trace peripheral edema Wound: the sternotomy is well approximated and dry.   Lab Results: Recent Labs    02/21/22 0410 02/22/22 0042  WBC 7.0 7.7  HGB 9.3* 8.8*  HCT 27.2* 26.3*  PLT 148* 172    BMET:  Recent Labs    02/22/22 0042 02/23/22 0101  NA 135 134*  K 3.8 3.8  CL 100 100  CO2 27 26  GLUCOSE 95 98  BUN 18 20  CREATININE 1.03 1.18  CALCIUM 8.7* 8.7*     PT/INR:  Recent Labs    02/23/22 0101  LABPROT 15.3*  INR 1.2    ABG    Component Value Date/Time   PHART 7.304 (L) 02/19/2022 1552   HCO3 22.8 02/19/2022 1552   TCO2 24 02/19/2022 1552   ACIDBASEDEF 4.0 (H) 02/19/2022 1552   O2SAT 95 02/19/2022 1552   CBG (last 3)  Recent Labs    02/22/22 1710 02/22/22 2102 02/23/22 0552  GLUCAP 104* 117*  91     Assessment/Plan: S/P Procedure(s) (LRB): AORTIC VALVE REPLACEMENT (AVR) 293mSJM REGENT MECHANICAL AORTIC VALVE (N/A) TRANSESOPHAGEAL ECHOCARDIOGRAM (TEE) (N/A)  -POD4 mechanical aortic valve replacement for severe AS with ischemic cardiomyopathy and bicuspid aortic valve  Stable BP, HR control is better. BP stable. On Warfarin, ASA ('81mg'$ ), atorvastatin, digoxin (0.'0625mg'$  daily, dosing per pharmacy), and metoprolol.  INR 1.2, pharmacy dosing the Coumadin and has '9mg'$  ordered for this PM. Has ICD for ICM.   - Sinus tach- improved. Will increase the metoprolol today.    -Volume excess- Wt 2kg+, continue diuresis with Lasix PO BID  -GI-BM prior to transfer. Tolerating PO's.  -HEME- expected acute blood loss anemia- Hct stable, monitoring.   -PULM- hypoxia related to volume excess and ATX- diuresing, working on IS.   -ENDO- no h/o DM, CBG's well controlled. D/C the CBGs and SSI  -DVT PPX- ambulate, continue daily enoxaparin until INR approaching therapeutic.   -Disposition- Progressing well, planning for eventual discharge to home when anticoagulation for the mechanical AVR is acceptable.    LOS: 4 days    MyAntony [phone] 02/23/2022    Agree with above Doing well INR 1.2 Dispo planning.  Amedio Bowlby O Bary Leriche

## 2022-02-23 NOTE — Progress Notes (Signed)
ANTICOAGULATION CONSULT NOTE  Pharmacy Consult for warfarin Indication:  hx LV thrombus, mechanical AVR  No Known Allergies  Patient Measurements: Height: '5\' 9"'$  (175.3 cm) Weight: 86.9 kg (191 lb 9.6 oz) IBW/kg (Calculated) : 70.7 Heparin Dosing Weight: 84.4 kg  Vital Signs: Temp: 98.5 F (36.9 C) (01/28 0800) Temp Source: Oral (01/28 0800) BP: 116/73 (01/28 0800) Pulse Rate: 98 (01/28 0800)  Labs: Recent Labs    02/20/22 1632 02/21/22 0410 02/22/22 0042 02/23/22 0101  HGB 10.4* 9.3* 8.8*  --   HCT 30.1* 27.2* 26.3*  --   PLT 171 148* 172  --   LABPROT  --  15.5* 15.6* 15.3*  INR  --  1.2 1.3* 1.2  CREATININE 1.09 0.95 1.03 1.18     Estimated Creatinine Clearance: 76.3 mL/min (by C-G formula based on SCr of 1.18 mg/dL).   Medical History: Past Medical History:  Diagnosis Date   Acid reflux    AICD (automatic cardioverter/defibrillator) present    Anxiety    Aortic stenosis with bicuspid valve    CHF (congestive heart failure) (HCC)    Depression    Dyspnea    GERD (gastroesophageal reflux disease)    HLD (hyperlipidemia)    Ischemic cardiomyopathy    Migraines    Myocardial infarction ([place]) 08/28/2019    Medications:  Scheduled:   acetaminophen  1,000 mg Oral Q6H   Or   acetaminophen (TYLENOL) oral liquid 160 mg/5 mL  1,000 mg Per Tube Q6H   acetaminophen (TYLENOL) oral liquid 160 mg/5 mL  650 mg Per Tube Once   Or   acetaminophen  650 mg Rectal Once   aspirin EC  81 mg Oral Daily   atorvastatin  80 mg Oral Daily   bisacodyl  10 mg Oral Daily   Or   bisacodyl  10 mg Rectal Daily   digoxin  0.0625 mg Oral Daily   docusate sodium  200 mg Oral Daily   enoxaparin (LOVENOX) injection  40 mg Subcutaneous Q24H   ezetimibe  10 mg Oral Daily   fenofibrate  160 mg Oral Daily   furosemide  40 mg Oral BID   insulin aspart  0-24 Units Subcutaneous TID AC & HS   metoprolol tartrate  12.5 mg Oral BID   Or   metoprolol tartrate  12.5 mg Per Tube BID    omega-3 acid ethyl esters  2 g Oral BID   pantoprazole  40 mg Oral Daily   potassium chloride  20 mEq Oral BID   sodium chloride flush  3 mL Intravenous Q12H   Warfarin - Pharmacist Dosing Inpatient   Does not apply q1600    Assessment: 36 yom presenting for mechanical AVR - was on warfarin PTA for hx mural thrombus. Last dose of warfarin was 1/19 - was bridged with enoxaparin. PTA regimen is 10 mg daily except 7.5 mg TThSat. Pharmacy consulted to restart warfarin regimen 1/25.   INR today remains subtherapeutic at 1.2. No s/sx of bleeding reported. Per notes, no LV thrombus noted on intra-op TEE.   Goal of Therapy:  INR= 2.5-3 Monitor platelets by anticoagulation protocol: Yes   Plan:  Warfarin 9 mg x1 tonight  Daily PT/INR Monitor for s/sx of bleeding    Billey Gosling, PharmD PGY1 Pharmacy Resident 1/28/202410:08 AM

## 2022-02-24 ENCOUNTER — Encounter: Payer: Self-pay | Admitting: *Deleted

## 2022-02-24 ENCOUNTER — Other Ambulatory Visit: Payer: Self-pay | Admitting: Physician Assistant

## 2022-02-24 ENCOUNTER — Encounter (HOSPITAL_COMMUNITY): Payer: Self-pay | Admitting: Thoracic Surgery (Cardiothoracic Vascular Surgery)

## 2022-02-24 ENCOUNTER — Other Ambulatory Visit (HOSPITAL_COMMUNITY): Payer: Self-pay

## 2022-02-24 LAB — CBC
HCT: 23.7 % — ABNORMAL LOW (ref 39.0–52.0)
Hemoglobin: 8.1 g/dL — ABNORMAL LOW (ref 13.0–17.0)
MCH: 31.3 pg (ref 26.0–34.0)
MCHC: 34.2 g/dL (ref 30.0–36.0)
MCV: 91.5 fL (ref 80.0–100.0)
Platelets: 205 10*3/uL (ref 150–400)
RBC: 2.59 MIL/uL — ABNORMAL LOW (ref 4.22–5.81)
RDW: 13.8 % (ref 11.5–15.5)
WBC: 5.4 10*3/uL (ref 4.0–10.5)
nRBC: 0.4 % — ABNORMAL HIGH (ref 0.0–0.2)

## 2022-02-24 LAB — BASIC METABOLIC PANEL
Anion gap: 10 (ref 5–15)
BUN: 22 mg/dL — ABNORMAL HIGH (ref 6–20)
CO2: 25 mmol/L (ref 22–32)
Calcium: 8.9 mg/dL (ref 8.9–10.3)
Chloride: 99 mmol/L (ref 98–111)
Creatinine, Ser: 1.08 mg/dL (ref 0.61–1.24)
GFR, Estimated: >60 mL/min (ref >60)
Glucose, Bld: 108 mg/dL — ABNORMAL HIGH (ref 70–99)
Potassium: 3.9 mmol/L (ref 3.5–5.1)
Sodium: 134 mmol/L — ABNORMAL LOW (ref 135–145)

## 2022-02-24 LAB — PROTIME-INR
INR: 1.2 (ref 0.8–1.2)
Prothrombin Time: 15.2 seconds (ref 11.4–15.2)

## 2022-02-24 MED ORDER — WARFARIN SODIUM 5 MG PO TABS: 7.5000 mg | ORAL_TABLET | ORAL | Status: DC

## 2022-02-24 MED ORDER — WARFARIN SODIUM 5 MG PO TABS: 10.0000 mg | ORAL_TABLET | ORAL | Status: DC

## 2022-02-24 MED ORDER — FUROSEMIDE 40 MG PO TABS
40.0000 mg | ORAL_TABLET | Freq: Every day | ORAL | 0 refills | Status: DC
  Filled 2022-02-24: qty 7, 7d supply, fill #0

## 2022-02-24 MED ORDER — POTASSIUM CHLORIDE CRYS ER 20 MEQ PO TBCR
20.0000 meq | EXTENDED_RELEASE_TABLET | Freq: Every day | ORAL | 0 refills | Status: DC
  Filled 2022-02-24: qty 7, 7d supply, fill #0

## 2022-02-24 MED ORDER — FENOFIBRATE 160 MG PO TABS
160.0000 mg | ORAL_TABLET | Freq: Every day | ORAL | 3 refills | Status: DC
  Filled 2022-02-24: qty 30, 30d supply, fill #0
  Filled 2022-03-23: qty 30, 30d supply, fill #1
  Filled 2022-04-20: qty 30, 30d supply, fill #2
  Filled 2022-05-14: qty 30, 30d supply, fill #3

## 2022-02-24 MED ORDER — OXYCODONE HCL 5 MG PO TABS
5.0000 mg | ORAL_TABLET | ORAL | 0 refills | Status: DC | PRN
  Filled 2022-02-24: qty 30, 5d supply, fill #0

## 2022-02-24 MED ORDER — WARFARIN - PHYSICIAN DOSING INPATIENT: Freq: Every day | Status: DC

## 2022-02-24 MED ORDER — FUROSEMIDE 20 MG PO TABS: 20.0000 mg | ORAL_TABLET | Freq: Every day | ORAL | Status: DC | PRN

## 2022-02-24 MED ORDER — CARVEDILOL 6.25 MG PO TABS: 6.2500 mg | ORAL_TABLET | Freq: Two times a day (BID) | ORAL | 11 refills | Status: DC

## 2022-02-24 NOTE — Progress Notes (Signed)
[address]       [place] [address]             [phone]      5 Days Post-Op Procedure(s) (LRB): AORTIC VALVE REPLACEMENT (AVR) 12m SJM [facility]ENT MECHANICAL AORTIC VALVE (N/A) TRANSESOPHAGEAL ECHOCARDIOGRAM (TEE) (N/A)  Subjective:  Patient sitting up in chair eating breakfast.  He has some increased pain this morning after coughing.  He is hoping to go home today.  + ambulation  + BM  Objective: Vital signs in last 24 hours: Temp:  [98.1 F (36.7 C)-98.5 F (36.9 C)] 98.1 F (36.7 C) (01/29 0537) Pulse Rate:  [80-98] 85 (01/29 0537) Cardiac Rhythm: Sinus tachycardia (01/28 2004) Resp:  [16-22] 20 (01/29 0537) BP: (101-120)/(61-80) 112/61 (01/29 0537) SpO2:  [91 %-95 %] 92 % (01/29 0537) Weight:  [87.6 kg] 87.6 kg (01/29 0537)  General appearance: alert, cooperative, and no distress Heart: regular rate and rhythm Lungs: clear to auscultation bilaterally Abdomen: soft, non-tender; bowel sounds normal; no masses,  no organomegaly Extremities: edema trace Wound: clean and dry  Lab Results: Recent Labs    02/22/22 0042 02/24/22 0104  WBC 7.7 5.4  HGB 8.8* 8.1*  HCT 26.3* 23.7*  PLT 172 205   BMET:  Recent Labs    02/23/22 0101 02/24/22 0104  NA 134* 134*  K 3.8 3.9  CL 100 99  CO2 26 25  GLUCOSE 98 108*  BUN 20 22*  CREATININE 1.18 1.08  CALCIUM 8.7* 8.9    PT/INR:  Recent Labs    02/24/22 0104  LABPROT 15.2  INR 1.2   ABG    Component Value Date/Time   PHART 7.304 (L) 02/19/2022 1552   HCO3 22.8 02/19/2022 1552   TCO2 24 02/19/2022 1552   ACIDBASEDEF 4.0 (H) 02/19/2022 1552   O2SAT 95 02/19/2022 1552   CBG (last 3)  Recent Labs    02/23/22 0552 02/23/22 1153 02/23/22 1636  GLUCAP 91 100* 87    Assessment/Plan: S/P Procedure(s) (LRB): AORTIC VALVE REPLACEMENT (AVR) 262mSJM REGENT MECHANICAL AORTIC VALVE (N/A) TRANSESOPHAGEAL ECHOCARDIOGRAM (TEE) (N/A)  CV- NSR, BP controlled- continue Digoxin,  Lopressor INR 1.2, will resume patient's home regimen of coumadin, patient has lovenox at home.. will plan to continue this until INR reaches 2 Pulm- no acute issues, off oxygen Renal- creatinine has been stable, mild edema on exam, will continue Lasix, potassium Dispo- patient stable, INR is at 1.2, however he has not been placed on his home regimen of coumadin will resume, will plan to continue Lovenox until INR is therapeutic as discussed with Dr. WeLavonna Monarchill d/c home today   LOS: 5 days    ErEllwood HandlerPA-C 02/24/2022

## 2022-02-24 NOTE — Progress Notes (Signed)
ANTICOAGULATION CONSULT NOTE  Pharmacy Consult for warfarin Indication:  hx LV thrombus, mechanical AVR  No Known Allergies  Patient Measurements: Height: '5\' 9"'$  (175.3 cm) Weight: 87.6 kg (193 lb 2 oz) IBW/kg (Calculated) : 70.7 Heparin Dosing Weight: 84.4 kg  Vital Signs: Temp: 98.1 F (36.7 C) (01/29 0537) Temp Source: Oral (01/29 0900) BP: 121/74 (01/29 0900) Pulse Rate: 98 (01/29 0900)  Labs: Recent Labs    02/22/22 0042 02/23/22 0101 02/24/22 0104  HGB 8.8*  --  8.1*  HCT 26.3*  --  23.7*  PLT 172  --  205  LABPROT 15.6* 15.3* 15.2  INR 1.3* 1.2 1.2  CREATININE 1.03 1.18 1.08     Estimated Creatinine Clearance: 83.7 mL/min (by C-G formula based on SCr of 1.08 mg/dL).   Medical History: Past Medical History:  Diagnosis Date   Acid reflux    AICD (automatic cardioverter/defibrillator) present    Anxiety    Aortic stenosis with bicuspid valve    CHF (congestive heart failure) (HCC)    Depression    Dyspnea    GERD (gastroesophageal reflux disease)    HLD (hyperlipidemia)    Ischemic cardiomyopathy    Migraines    Myocardial infarction ([place]) 08/28/2019    Medications:  Scheduled:   acetaminophen  1,000 mg Oral Q6H   Or   acetaminophen (TYLENOL) oral liquid 160 mg/5 mL  1,000 mg Per Tube Q6H   acetaminophen (TYLENOL) oral liquid 160 mg/5 mL  650 mg Per Tube Once   Or   acetaminophen  650 mg Rectal Once   aspirin EC  81 mg Oral Daily   atorvastatin  80 mg Oral Daily   bisacodyl  10 mg Oral Daily   Or   bisacodyl  10 mg Rectal Daily   digoxin  0.0625 mg Oral Daily   docusate sodium  200 mg Oral Daily   enoxaparin (LOVENOX) injection  40 mg Subcutaneous Q24H   ezetimibe  10 mg Oral Daily   fenofibrate  160 mg Oral Daily   furosemide  40 mg Oral BID   metoprolol tartrate  25 mg Oral BID   Or   metoprolol tartrate  12.5 mg Per Tube BID   omega-3 acid ethyl esters  2 g Oral BID   pantoprazole  40 mg Oral Daily   potassium chloride  20 mEq Oral  BID   sodium chloride flush  3 mL Intravenous Q12H   warfarin  10 mg Oral Q M,W,F,Su-1800   [START ON 02/25/2022] warfarin  7.5 mg Oral Q T,Th,Sat-1800   Warfarin - Physician Dosing Inpatient   Does not apply q1600    Assessment: 64 yom presenting for mechanical AVR - was on warfarin PTA for hx mural thrombus. Last dose of warfarin was 1/19 - was bridged with enoxaparin. PTA regimen is 10 mg daily except 7.5 mg TThSat. Pharmacy consulted to restart warfarin regimen 1/25.   INR today remains subtherapeutic at 1.2. No s/sx of bleeding reported. Per notes, no LV thrombus noted on intra-op TEE.   Goal of Therapy:  INR= 2.5-3 Monitor platelets by anticoagulation protocol: Yes   Plan:  Plan to discharge home with enoxaparin bridging to warfarin.  Recommend enoxaparin '80mg'$  Rayle q12h until INR > 2.5 and to resume home warfarin regimen of '10mg'$  daily on Sun/Mon/Wed/Fri and 7.'5mg'$  on Tu/Th/Sat starting this evening. Recommend INR check on 1/31 or 2/1, the latest.  Warfarin '10mg'$  PO x1 tonight, in case discharge is delayed for any reason. Daily PT/INR  Monitor for s/sx of bleeding   Luisa Hart, PharmD, BCPS Clinical Pharmacist 02/24/2022 10:08 AM   Please refer to [facility] for pharmacy phone number

## 2022-02-24 NOTE — Progress Notes (Signed)
Discharge instructions reviewed with pt.  Copy of instructions given to pt. Scripts filled by [facility], and will be picked up as pt is taken to the discharge lounge.  Pt states his ride will be here around 4:30pm to pick him up.  Pt to be d/c'd via wheelchair with belongings, to the discharge lounge.          Will be escorted by staff.

## 2022-02-24 NOTE — Progress Notes (Signed)
Contacted patient on missed Coreg at discharge.  He will need to take Coreg from home at reduced dose of 6.25 mg BID.  He was instructed to contact our office at [phone].   Ellwood Handler, PA-C

## 2022-02-24 NOTE — Progress Notes (Signed)
Chest tube sutures removed without problem as ordered. Steri strips applied. Pt educated on their care and when they will come off on their on. Pt verbalized understanding.

## 2022-02-24 NOTE — Progress Notes (Addendum)
CARDIAC REHAB PHASE I   PRE:  Rate/Rhythm: 104/ ST  BP:  Sitting: 121/74      SaO2: 92 RA  MODE:  Ambulation: 770 ft   POST:  Rate/Rhythm: 108 ST  BP:  Sitting: 130/85      SaO2: 97 RA  Pt tolerated exercise well and amb 770 ft without AD, and independently. Pt denies CP, SOB, or dizziness throughout walk. Reviewed education including sternal precautions, smoking cessation, exercise, and CR phase 2 with good understanding. Gave OHS booklet.  [place], RRT, BSRT 02/24/2022 9:13 AM

## 2022-02-24 NOTE — Plan of Care (Signed)
  Problem: Education: Goal: Will demonstrate proper wound care and an understanding of methods to prevent future damage Outcome: Completed/Met Goal: Knowledge of disease or condition will improve Outcome: Completed/Met Goal: Knowledge of the prescribed therapeutic regimen will improve Outcome: Completed/Met Goal: Individualized Educational Video(s) Outcome: Completed/Met   Problem: Activity: Goal: Risk for activity intolerance will decrease Outcome: Completed/Met   Problem: Cardiac: Goal: Will achieve and/or maintain hemodynamic stability Outcome: Completed/Met   Problem: Clinical Measurements: Goal: Postoperative complications will be avoided or minimized Outcome: Completed/Met   Problem: Respiratory: Goal: Respiratory status will improve Outcome: Completed/Met   Problem: Skin Integrity: Goal: Wound healing without signs and symptoms of infection Outcome: Completed/Met Goal: Risk for impaired skin integrity will decrease Outcome: Completed/Met   Problem: Urinary Elimination: Goal: Ability to achieve and maintain adequate renal perfusion and functioning will improve Outcome: Completed/Met   Problem: Education: Goal: Knowledge of General Education information will improve Description: Including pain rating scale, medication(s)/side effects and non-pharmacologic comfort measures Outcome: Completed/Met   Problem: Health Behavior/Discharge Planning: Goal: Ability to manage health-related needs will improve Outcome: Completed/Met   Problem: Clinical Measurements: Goal: Ability to maintain clinical measurements within normal limits will improve Outcome: Completed/Met Goal: Will remain free from infection Outcome: Completed/Met Goal: Diagnostic test results will improve Outcome: Completed/Met Goal: Respiratory complications will improve Outcome: Completed/Met Goal: Cardiovascular complication will be avoided Outcome: Completed/Met   Problem: Activity: Goal: Risk for  activity intolerance will decrease Outcome: Completed/Met   Problem: Nutrition: Goal: Adequate nutrition will be maintained Outcome: Completed/Met   Problem: Coping: Goal: Level of anxiety will decrease Outcome: Completed/Met   Problem: Elimination: Goal: Will not experience complications related to bowel motility Outcome: Completed/Met Goal: Will not experience complications related to urinary retention Outcome: Completed/Met   Problem: Pain Managment: Goal: General experience of comfort will improve Outcome: Completed/Met   Problem: Safety: Goal: Ability to remain free from injury will improve Outcome: Completed/Met   Problem: Skin Integrity: Goal: Risk for impaired skin integrity will decrease Outcome: Completed/Met   

## 2022-02-25 ENCOUNTER — Ambulatory Visit (INDEPENDENT_AMBULATORY_CARE_PROVIDER_SITE_OTHER): Payer: Medicaid Other

## 2022-02-25 ENCOUNTER — Encounter (HOSPITAL_COMMUNITY): Payer: Self-pay

## 2022-02-25 DIAGNOSIS — I255 Ischemic cardiomyopathy: Secondary | ICD-10-CM

## 2022-02-25 NOTE — Telephone Encounter (Signed)
Attempted to contact patient via phone will send my chart message

## 2022-02-26 ENCOUNTER — Telehealth (HOSPITAL_COMMUNITY): Payer: Self-pay

## 2022-02-26 ENCOUNTER — Ambulatory Visit (INDEPENDENT_AMBULATORY_CARE_PROVIDER_SITE_OTHER): Payer: Medicaid Other

## 2022-02-26 DIAGNOSIS — Z5181 Encounter for therapeutic drug level monitoring: Secondary | ICD-10-CM

## 2022-02-26 DIAGNOSIS — I513 Intracardiac thrombosis, not elsewhere classified: Secondary | ICD-10-CM

## 2022-02-26 LAB — POCT INR: INR: 1.4 — AB (ref 2.0–3.0)

## 2022-02-26 MED ORDER — FUROSEMIDE 20 MG PO TABS: 20.0000 mg | ORAL_TABLET | Freq: Every day | ORAL | 3 refills | Status: DC

## 2022-02-26 NOTE — Telephone Encounter (Signed)
Please give him a prescription for Lasix 20 mg daily (but he can take prn).   Meds ordered this encounter  Medications   furosemide (LASIX) 20 MG tablet    Sig: Take 1 tablet (20 mg total) by mouth daily.    Dispense:  90 tablet    Refill:  3    Please cancel all previous orders for current medication. Change in dosage or pill size.

## 2022-02-26 NOTE — Patient Instructions (Signed)
Description   Continue Lovenox injections.  Take 3 tablets today and 2 tablets tomorrow and then continue taking warfarin 2 tablets daily except for 1.5 tablets on Tuesdays, Thursdays and Saturdays.  Recheck INR 1 week.  Coumadin Clinic [phone] AVR 1/24 Lovenox Bridged BILL [id number]

## 2022-02-28 ENCOUNTER — Other Ambulatory Visit: Payer: Self-pay | Admitting: Thoracic Surgery (Cardiothoracic Vascular Surgery)

## 2022-02-28 DIAGNOSIS — Z952 Presence of prosthetic heart valve: Secondary | ICD-10-CM

## 2022-02-28 MED FILL — Mannitol IV Soln 20%: INTRAVENOUS | Qty: 500 | Status: AC

## 2022-02-28 MED FILL — Sodium Bicarbonate IV Soln 8.4%: INTRAVENOUS | Qty: 50 | Status: AC

## 2022-02-28 MED FILL — Sodium Chloride IV Soln 0.9%: INTRAVENOUS | Qty: 2000 | Status: AC

## 2022-02-28 MED FILL — Heparin Sodium (Porcine) Inj 1000 Unit/ML: Qty: 1000 | Status: AC

## 2022-02-28 MED FILL — Lidocaine HCl Local Preservative Free (PF) Inj 2%: INTRAMUSCULAR | Qty: 14 | Status: AC

## 2022-02-28 MED FILL — Electrolyte-R (PH 7.4) Solution: INTRAVENOUS | Qty: 4000 | Status: AC

## 2022-02-28 MED FILL — Potassium Chloride Inj 2 mEq/ML: INTRAVENOUS | Qty: 40 | Status: AC

## 2022-02-28 NOTE — Progress Notes (Unsigned)
[address]       [place] [address]             [phone]  HPI: This is a 57 year old with a past medical history of MI, ischemic  cardiomyopathy  GERD, migraines, anxiety, and CHF who was  found to have a bicuspid valve and severe aortic stenosis. Patient returns for routine postoperative follow-up having undergone a median Sternotomy for AVR (using a St. Jude mechanical valve, size 23) on 02/19/2022 by Dr. Lavonna Monarch. Since hospital discharge the patient reports He feels he is doing fairly well. He walks almost daily. He denies chest pain, shortness of breath.   Current Outpatient Medications  Medication Sig Dispense Refill   acetaminophen (TYLENOL) 500 MG tablet Take 500 mg by mouth every 6 (six) hours as needed for headache.     aspirin 81 MG chewable tablet Chew 1 tablet (81 mg total) by mouth daily. 90 tablet 3   atorvastatin (LIPITOR) 80 MG tablet Take 1 tablet (80 mg total) by mouth daily. 30 tablet 11   carvedilol (COREG) 6.25 MG tablet Take 1 tablet (6.25 mg total) by mouth 2 (two) times daily. 60 tablet 11   digoxin (LANOXIN) 0.125 MG tablet Take 1/2 tablet (0.0625 mg total) by mouth daily. 30 tablet 6   enoxaparin (LOVENOX) 80 MG/0.8ML injection Inject 0.8 mLs (80 mg total) into the skin every 12 (twelve) hours. 8 mL 1   ezetimibe (ZETIA) 10 MG tablet Take 1 tablet (10 mg total) by mouth daily. 90 tablet 0   fenofibrate 160 MG tablet Take 1 tablet (160 mg total) by mouth daily. 30 tablet 3   furosemide (LASIX) 20 MG tablet Take 1 tablet (20 mg total) by mouth daily. 90 tablet 3   hydrOXYzine (ATARAX) 25 MG tablet Take 1 tablet (25 mg total) by mouth 3 (three) times daily as needed. 90 tablet 3   melatonin 5 MG TABS Take 10 mg by mouth at bedtime.     mirtazapine (REMERON) 45 MG tablet Take 1 tablet (45 mg total) by mouth at bedtime. 30 tablet 3   Multiple Vitamin (MULTIVITAMIN WITH MINERALS) TABS tablet Take 1 tablet by mouth daily.     omega-3 acid  ethyl esters (LOVAZA) 1 g capsule TAKE 2 CAPSULES (2 GRAMS TOTAL) BY MOUTH TWO TIMES DAILY. 120 capsule 3   omeprazole (PRILOSEC) 20 MG capsule Take 20 mg by mouth daily before breakfast.     oxyCODONE (OXY IR/ROXICODONE) 5 MG immediate release tablet Take 1 tablet (5 mg total) by mouth every 4 (four) hours as needed for severe pain. 30 tablet 0   potassium chloride SA (KLOR-CON M) 20 MEQ tablet Take 1 tablet (20 mEq total) by mouth daily for 7 days 7 tablet 0   Probiotic Product (PROBIOTIC DAILY PO) Take 1 capsule by mouth with breakfast, with lunch, and with evening meal.     sertraline (ZOLOFT) 100 MG tablet Take 1.5 tablets (150 mg total) by mouth daily. 45 tablet 3   SILDENAFIL CITRATE PO Take 45-90 mg by mouth daily as needed (ED). Take 1 - 2 tablets 30 to 45 min before sexual activity as needed     warfarin (COUMADIN) 5 MG tablet Take 1.5-2 tablets (7.5-10 mg total) by mouth See admin instructions. Take 2 tablets (10 mg) by mouth on Sundays, Mondays, Wednesdays & Fridays in the evening. Take 1.5 tablet (7.5 mg) by mouth on Tuesdays, Thursdays & Saturdays in the evening.  Vital Signs: Vitals:   03/03/22 1412  BP: 113/75  Pulse: 81  Resp: 20  SpO2: 96%      Physical Exam: CV-RRR, sharp valve click Pulmonary-Clear to auscultation bilaterally Abdomen-Soft, non tender, bowel sounds Extremities-No LE edema Wound-Clean and dry, no sign of infection  Diagnostic Tests:  Narrative & Impression  CLINICAL DATA:  Aortic valve replacement.   EXAM: CHEST - 2 VIEW   COMPARISON:  02/21/2022 and CT chest 12/20/2021.   FINDINGS: Trachea is midline. Heart is enlarged. ICD lead tip is in the right ventricle. Aortic valve replacement. Thoracic aorta is calcified. Minimal streaky atelectasis in the lung bases. Lungs are otherwise clear. No pleural fluid or pneumothorax.   IMPRESSION: Minimal basilar streaky atelectasis.     Electronically Signed   By: Lorin Picket M.D.   On:  03/03/2022 13:42     Impression and Plan:  He is to continue with sternal precautions (I.e. no  lifting more than 10 pounds) for the next 6 weeks. We discussed endocarditis prophylaxis (summarized in AVS). At the next office visit, we will discuss if taking narcotic for pain, If no, he will be allowed to start driving.We also discussed him following up with his PCP as HGA1C prior to surgery was 5.8 (pre diabetes). We talked about low carbohydrate diet as well. His INR 02/19/2022 was 1.4. He has another INR draw in the am. He is on Coumadin for a St. Jude mechanical AV. He will return in 1-2 weeks to see Dr. Lavonna Monarch (no CXR needed). He will see Dr. Aundra Dubin on 03/24/2022.    Nani Skillern, PA-C [facility] [phone]

## 2022-03-03 ENCOUNTER — Ambulatory Visit
Admission: RE | Admit: 2022-03-03 | Discharge: 2022-03-03 | Disposition: A | Discharge status: Home / Self Care | Payer: Medicaid Other | Source: Ambulatory Visit | Attending: Thoracic Surgery (Cardiothoracic Vascular Surgery) | Admitting: Thoracic Surgery (Cardiothoracic Vascular Surgery)

## 2022-03-03 ENCOUNTER — Ambulatory Visit (INDEPENDENT_AMBULATORY_CARE_PROVIDER_SITE_OTHER): Payer: Self-pay | Admitting: Physician Assistant

## 2022-03-03 VITALS — BP 113/75 | HR 81 | Resp 20 | Wt 189.0 lb

## 2022-03-03 DIAGNOSIS — Z952 Presence of prosthetic heart valve: Secondary | ICD-10-CM

## 2022-03-03 DIAGNOSIS — I517 Cardiomegaly: Secondary | ICD-10-CM | POA: Diagnosis not present

## 2022-03-03 DIAGNOSIS — I350 Nonrheumatic aortic (valve) stenosis: Secondary | ICD-10-CM

## 2022-03-03 DIAGNOSIS — J9811 Atelectasis: Secondary | ICD-10-CM | POA: Diagnosis not present

## 2022-03-03 DIAGNOSIS — I700 Atherosclerosis of aorta: Secondary | ICD-10-CM | POA: Diagnosis not present

## 2022-03-03 LAB — CUP PACEART REMOTE DEVICE CHECK
Battery Remaining Longevity: 150 mo
Battery Remaining Percentage: 100 %
Brady Statistic RV Percent Paced: 0 %
Date Time Interrogation Session: 20240201104500
HighPow Impedance: 61 Ohm
Implantable Lead Connection Status: 753985
Implantable Lead Implant Date: 20211101
Implantable Lead Location: 753860
Implantable Lead Model: 138
Implantable Lead Serial Number: 303500
Implantable Pulse Generator Implant Date: 20211101
Lead Channel Impedance Value: 411 Ohm
Lead Channel Setting Pacing Amplitude: 2.5 V
Lead Channel Setting Pacing Pulse Width: 0.4 ms
Lead Channel Setting Sensing Sensitivity: 0.5 mV
Pulse Gen Serial Number: 209902

## 2022-03-03 NOTE — Patient Instructions (Addendum)
Continue to avoid any heavy lifting or strenuous use of your arms or shoulders for at least a total of three months from the time of surgery.  After three months you may gradually increase how much you lift or otherwise use your arms or chest as tolerated, with limits based upon whether or not activities lead to the return of significant discomfort. 2. Endocarditis is a potentially serious infection of heart valves or inside lining of the heart.  It occurs more commonly in patients with diseased heart valves (such as patient's with aortic or mitral valve disease) and in patients who have undergone heart valve repair or replacement. Certain surgical and dental procedures may put you at risk, such as dental cleaning, other dental procedures, or any surgery involving the respiratory, urinary, gastrointestinal tract, gallbladder or prostate gland.   To minimize your chances for develooping endocarditis, maintain good oral health and seek prompt medical attention for any infections involving the mouth, teeth, gums, skin or urinary tract.    Always notify your doctor or dentist about your underlying heart valve condition before having any invasive procedures. You will need to take antibiotics before certain procedures, including all routine dental cleanings or other dental procedures.  Your cardiologist or dentist should prescribe these antibiotics for you to be taken ahead of time.  Patient's pre op HGA1C was 5.8. he likely has pre diabetes. He will need to obtain a medical doctor for further surveillance. We discussed the importance of diet (low carbohydrate, low salt diet) 4. Please see PCP for HGA1C surveillance (5.8 prior to surgery)-pre diabetes

## 2022-03-04 ENCOUNTER — Ambulatory Visit: Payer: Medicaid Other | Attending: Internal Medicine | Admitting: *Deleted

## 2022-03-04 DIAGNOSIS — Z7901 Long term (current) use of anticoagulants: Secondary | ICD-10-CM | POA: Diagnosis not present

## 2022-03-04 DIAGNOSIS — I513 Intracardiac thrombosis, not elsewhere classified: Secondary | ICD-10-CM | POA: Insufficient documentation

## 2022-03-04 DIAGNOSIS — Z5181 Encounter for therapeutic drug level monitoring: Secondary | ICD-10-CM | POA: Diagnosis not present

## 2022-03-04 LAB — POCT INR: INR: 4 — AB (ref 2.0–3.0)

## 2022-03-04 NOTE — Patient Instructions (Signed)
Description   Stop Lovenox injections. Take 1 tablet of warfarin today then continue taking warfarin 2 tablets daily except for 1.5 tablets on Tuesdays, Thursdays and Saturdays. Recheck INR 1 week.  Coumadin Clinic [phone]  [place] [id number]

## 2022-03-07 ENCOUNTER — Encounter: Payer: Self-pay | Admitting: Cardiology

## 2022-03-11 ENCOUNTER — Telehealth (HOSPITAL_COMMUNITY): Payer: Self-pay

## 2022-03-11 NOTE — Telephone Encounter (Signed)
Attempted to call patient in regards to Cardiac Rehab - LM on VM 

## 2022-03-11 NOTE — Telephone Encounter (Signed)
Pt insurance is active and benefits verified through Medicaid. Co-pay $0.00, DED $0.00/$0.00 met, out of pocket $0.00/$0.00 met, co-insurance 0%. No pre-authorization required. Passport, 03/11/22 @ 2:27PM, [id number]   How many CR sessions are covered? (36 sessions for TCR, 72 sessions for ICR)21 visit Is this a lifetime maximum or an annual maximum? lifetime Has the member used any of these services to date? No Is there a time limit (weeks/months) on start of program and/or program completion? No     Will contact patient to see if he is interested in the Cardiac Rehab Program. If interested, patient will need to complete follow up appt. Once completed, patient will be contacted for scheduling upon review by the RN Navigator.

## 2022-03-13 ENCOUNTER — Ambulatory Visit: Payer: Medicaid Other | Attending: Cardiology

## 2022-03-13 DIAGNOSIS — I513 Intracardiac thrombosis, not elsewhere classified: Secondary | ICD-10-CM | POA: Diagnosis not present

## 2022-03-13 DIAGNOSIS — Z5181 Encounter for therapeutic drug level monitoring: Secondary | ICD-10-CM

## 2022-03-13 LAB — POCT INR: INR: 5 — AB (ref 2.0–3.0)

## 2022-03-13 NOTE — Patient Instructions (Signed)
Description   Eat greens today. Hold today's dose. Then START taking 1.5 tablets daily except 2 tablets on Mondays and Fridays.  Recheck INR 1 week.  Coumadin Clinic [phone]  [place] [id number]

## 2022-03-16 ENCOUNTER — Other Ambulatory Visit: Payer: Self-pay | Admitting: Cardiology

## 2022-03-16 ENCOUNTER — Other Ambulatory Visit: Payer: Self-pay | Admitting: Thoracic Surgery (Cardiothoracic Vascular Surgery)

## 2022-03-16 DIAGNOSIS — I513 Intracardiac thrombosis, not elsewhere classified: Secondary | ICD-10-CM

## 2022-03-17 ENCOUNTER — Other Ambulatory Visit: Payer: Self-pay | Admitting: Thoracic Surgery (Cardiothoracic Vascular Surgery)

## 2022-03-17 ENCOUNTER — Other Ambulatory Visit: Payer: Self-pay

## 2022-03-17 DIAGNOSIS — I513 Intracardiac thrombosis, not elsewhere classified: Secondary | ICD-10-CM

## 2022-03-17 MED ORDER — OMEGA-3-ACID ETHYL ESTERS 1 G PO CAPS
2.0000 g | ORAL_CAPSULE | Freq: Two times a day (BID) | ORAL | 3 refills | Status: DC
  Filled 2022-03-17: qty 120, 30d supply, fill #0
  Filled 2022-04-13: qty 120, 30d supply, fill #1
  Filled 2022-05-14: qty 120, 30d supply, fill #2
  Filled 2022-06-08: qty 120, 30d supply, fill #3

## 2022-03-18 ENCOUNTER — Other Ambulatory Visit (HOSPITAL_COMMUNITY): Payer: Self-pay | Admitting: Cardiology

## 2022-03-18 ENCOUNTER — Other Ambulatory Visit: Payer: Self-pay

## 2022-03-18 DIAGNOSIS — I513 Intracardiac thrombosis, not elsewhere classified: Secondary | ICD-10-CM

## 2022-03-18 MED FILL — Warfarin Sodium Tab 5 MG: ORAL | 27 days supply | Qty: 55 | Fill #0 | Status: AC

## 2022-03-20 ENCOUNTER — Ambulatory Visit: Payer: Medicaid Other | Attending: Cardiology | Admitting: *Deleted

## 2022-03-20 DIAGNOSIS — I513 Intracardiac thrombosis, not elsewhere classified: Secondary | ICD-10-CM | POA: Diagnosis not present

## 2022-03-20 DIAGNOSIS — Z7901 Long term (current) use of anticoagulants: Secondary | ICD-10-CM | POA: Diagnosis not present

## 2022-03-20 DIAGNOSIS — Z5181 Encounter for therapeutic drug level monitoring: Secondary | ICD-10-CM | POA: Diagnosis not present

## 2022-03-20 LAB — POCT INR: INR: 3.4 — AB (ref 2.0–3.0)

## 2022-03-20 NOTE — Patient Instructions (Signed)
Description   Continue taking warfarin 1.5 tablets daily except 2 tablets on Mondays and Fridays.  Recheck INR 2 weeks. Coumadin Clinic [phone] or [phone]  [place] [id number]

## 2022-03-21 NOTE — Progress Notes (Signed)
Remote ICD transmission.   

## 2022-03-23 ENCOUNTER — Other Ambulatory Visit (HOSPITAL_COMMUNITY): Payer: Self-pay | Admitting: Cardiology

## 2022-03-24 ENCOUNTER — Ambulatory Visit (INDEPENDENT_AMBULATORY_CARE_PROVIDER_SITE_OTHER): Payer: Self-pay | Admitting: Thoracic Surgery (Cardiothoracic Vascular Surgery)

## 2022-03-24 ENCOUNTER — Encounter: Payer: Self-pay | Admitting: Thoracic Surgery (Cardiothoracic Vascular Surgery)

## 2022-03-24 ENCOUNTER — Other Ambulatory Visit: Payer: Self-pay

## 2022-03-24 ENCOUNTER — Ambulatory Visit (HOSPITAL_COMMUNITY)
Admission: RE | Admit: 2022-03-24 | Discharge: 2022-03-24 | Disposition: A | Discharge status: Home / Self Care | Payer: Medicaid Other | Source: Ambulatory Visit | Attending: Cardiology | Admitting: Cardiology

## 2022-03-24 ENCOUNTER — Other Ambulatory Visit (HOSPITAL_COMMUNITY): Payer: Self-pay

## 2022-03-24 ENCOUNTER — Telehealth (HOSPITAL_COMMUNITY): Payer: Self-pay | Admitting: Pharmacy Technician

## 2022-03-24 ENCOUNTER — Encounter (HOSPITAL_COMMUNITY): Payer: Self-pay | Admitting: Cardiology

## 2022-03-24 VITALS — BP 138/94 | HR 110 | Wt 193.8 lb

## 2022-03-24 VITALS — BP 128/82 | HR 107 | Resp 18 | Ht 69.0 in | Wt 189.0 lb

## 2022-03-24 DIAGNOSIS — I5022 Chronic systolic (congestive) heart failure: Secondary | ICD-10-CM | POA: Insufficient documentation

## 2022-03-24 DIAGNOSIS — I2510 Atherosclerotic heart disease of native coronary artery without angina pectoris: Secondary | ICD-10-CM | POA: Diagnosis not present

## 2022-03-24 DIAGNOSIS — Z7901 Long term (current) use of anticoagulants: Secondary | ICD-10-CM | POA: Insufficient documentation

## 2022-03-24 DIAGNOSIS — Z952 Presence of prosthetic heart valve: Secondary | ICD-10-CM | POA: Insufficient documentation

## 2022-03-24 DIAGNOSIS — Z95810 Presence of automatic (implantable) cardiac defibrillator: Secondary | ICD-10-CM | POA: Diagnosis not present

## 2022-03-24 DIAGNOSIS — Z87891 Personal history of nicotine dependence: Secondary | ICD-10-CM | POA: Diagnosis not present

## 2022-03-24 DIAGNOSIS — Z7982 Long term (current) use of aspirin: Secondary | ICD-10-CM | POA: Diagnosis not present

## 2022-03-24 DIAGNOSIS — Z79899 Other long term (current) drug therapy: Secondary | ICD-10-CM | POA: Diagnosis not present

## 2022-03-24 DIAGNOSIS — Q231 Congenital insufficiency of aortic valve: Secondary | ICD-10-CM | POA: Insufficient documentation

## 2022-03-24 DIAGNOSIS — R0601 Orthopnea: Secondary | ICD-10-CM | POA: Diagnosis not present

## 2022-03-24 DIAGNOSIS — I252 Old myocardial infarction: Secondary | ICD-10-CM | POA: Diagnosis not present

## 2022-03-24 DIAGNOSIS — Z9889 Other specified postprocedural states: Secondary | ICD-10-CM

## 2022-03-24 DIAGNOSIS — E785 Hyperlipidemia, unspecified: Secondary | ICD-10-CM | POA: Insufficient documentation

## 2022-03-24 LAB — BASIC METABOLIC PANEL
Anion gap: 7 (ref 5–15)
BUN: 15 mg/dL (ref 6–20)
CO2: 28 mmol/L (ref 22–32)
Calcium: 9.9 mg/dL (ref 8.9–10.3)
Chloride: 105 mmol/L (ref 98–111)
Creatinine, Ser: 1.02 mg/dL (ref 0.61–1.24)
GFR, Estimated: >60 mL/min (ref >60)
Glucose, Bld: 148 mg/dL — ABNORMAL HIGH (ref 70–99)
Potassium: 4.3 mmol/L (ref 3.5–5.1)
Sodium: 140 mmol/L (ref 135–145)

## 2022-03-24 LAB — CBC
HCT: 38.7 % — ABNORMAL LOW (ref 39.0–52.0)
Hemoglobin: 12.2 g/dL — ABNORMAL LOW (ref 13.0–17.0)
MCH: 27.3 pg (ref 26.0–34.0)
MCHC: 31.5 g/dL (ref 30.0–36.0)
MCV: 86.6 fL (ref 80.0–100.0)
Platelets: 307 10*3/uL (ref 150–400)
RBC: 4.47 MIL/uL (ref 4.22–5.81)
RDW: 13.9 % (ref 11.5–15.5)
WBC: 6.6 10*3/uL (ref 4.0–10.5)
nRBC: 0 % (ref 0.0–0.2)

## 2022-03-24 LAB — DIGOXIN LEVEL: Digoxin Level: <0.2 ng/mL — ABNORMAL LOW (ref 0.8–2.0)

## 2022-03-24 MED ORDER — FUROSEMIDE 20 MG PO TABS: 20.0000 mg | ORAL_TABLET | Freq: Every day | ORAL | 3 refills | Status: DC

## 2022-03-24 MED ORDER — EZETIMIBE 10 MG PO TABS
10.0000 mg | ORAL_TABLET | Freq: Every day | ORAL | 0 refills | Status: DC
  Filled 2022-03-24: qty 90, 90d supply, fill #0

## 2022-03-24 MED ORDER — SPIRONOLACTONE 25 MG PO TABS
25.0000 mg | ORAL_TABLET | Freq: Every day | ORAL | 3 refills | Status: DC
  Filled 2022-03-24: qty 90, 90d supply, fill #0
  Filled 2022-07-20: qty 90, 90d supply, fill #1

## 2022-03-24 MED ORDER — ENTRESTO 49-51 MG PO TABS
1.0000 | ORAL_TABLET | Freq: Two times a day (BID) | ORAL | 3 refills | Status: DC
  Filled 2022-03-24: qty 60, 30d supply, fill #0

## 2022-03-24 MED ORDER — DIGOXIN 125 MCG PO TABS
0.0625 mg | ORAL_TABLET | Freq: Every day | ORAL | 6 refills | Status: DC
  Filled 2022-03-24: qty 30, 60d supply, fill #0
  Filled 2022-05-25: qty 30, 60d supply, fill #1
  Filled 2022-07-21: qty 30, 60d supply, fill #2
  Filled 2022-09-20: qty 30, 60d supply, fill #3
  Filled 2022-11-23: qty 30, 60d supply, fill #4
  Filled 2023-01-17: qty 30, 60d supply, fill #5
  Filled 2023-03-21: qty 30, 60d supply, fill #6

## 2022-03-24 MED ORDER — DAPAGLIFLOZIN PROPANEDIOL 10 MG PO TABS
10.0000 mg | ORAL_TABLET | Freq: Every day | ORAL | 11 refills | Status: DC
  Filled 2022-03-24: qty 30, 30d supply, fill #0
  Filled 2022-05-09: qty 30, 30d supply, fill #1
  Filled 2022-06-08 – 2022-06-09 (×2): qty 30, 30d supply, fill #2
  Filled 2022-07-06 – 2022-07-07 (×2): qty 30, 30d supply, fill #3
  Filled 2022-08-10: qty 30, 30d supply, fill #4
  Filled 2022-09-06: qty 30, 30d supply, fill #5
  Filled 2022-10-06: qty 30, 30d supply, fill #6
  Filled 2022-11-08: qty 30, 30d supply, fill #7
  Filled 2022-12-06: qty 30, 30d supply, fill #8
  Filled 2023-01-03: qty 30, 30d supply, fill #9
  Filled 2023-01-31: qty 30, 30d supply, fill #10
  Filled 2023-03-08: qty 30, 30d supply, fill #11

## 2022-03-24 NOTE — Patient Instructions (Signed)
Follow up with cardiology Cardiac rehab

## 2022-03-24 NOTE — Patient Instructions (Addendum)
RESTART Spironolactone 25 mg daily, Farxiga 10 mg daily, Lasix '20mg'$  daily and Entresto 49/51 mg Twice daily  Labs done today, your results will be available in MyChart, we will contact you for abnormal readings.  Repeat blood work in 10 days.  Please follow up with our heart failure pharmacist in 3 weeks.  Your physician recommends that you schedule a follow-up appointment in: 6 weeks  If you have any questions or concerns before your next appointment please send [place] a message through [place] or call our office at [phone].    TO LEAVE A MESSAGE FOR THE NURSE SELECT OPTION 2, PLEASE LEAVE A MESSAGE INCLUDING: YOUR NAME DATE OF BIRTH CALL BACK NUMBER REASON FOR CALL**this is important as we prioritize the call backs  YOU WILL RECEIVE A CALL BACK THE SAME DAY AS LONG AS YOU CALL BEFORE 4:00 PM  At the [facility], you and your health needs are our priority. As part of our continuing mission to provide you with exceptional heart care, we have created designated Provider Care Teams. These Care Teams include your primary Cardiologist (physician) and Advanced Practice Providers (APPs- Physician Assistants and Nurse Practitioners) who all work together to provide you with the care you need, when you need it.   You may see any of the following providers on your designated Care Team at your next follow up: Dr Glori Bickers Dr Loralie Champagne Dr. Roxana Hires, NP Lyda Jester, [facility] [place], [place] Forestine Na, NP Audry Riles, PharmD   Please be sure to bring in all your medications bottles to every appointment.    Thank you for choosing [facility]

## 2022-03-24 NOTE — Progress Notes (Signed)
Advanced Heart Failure Clinic Note   PCP: Pcp, [name] Cardiology: Dr. Aundra Dubin  Reason for Visit: Heart Failure    HPI: 57 y.o. male smoker was admitted on 08/28/19 for delayed presentation anterior STEMI. Had had >36 hr of chest pain prior to seeking medical attention. Pain progressed to more pleuritic like CP. Found to have anterior ST elevations w/ precordial Q waves on admit. Hs troponin >27,000. Urgent cardiac cath showed totally occluded mid LAD w/o collaterals and 40% mid RCA stenosis. It was suspected he had completed his infarct and residual pleuritic CP was post-MI pericarditis. No intervention was performed. 2D echo demonstrated moderately reduced LVEF, 45-50%, + apical thrombus and moderate AS. MV normal. RV systolic function mildly reduced.    He was placed on medical management for CAD and systolic HF. He was started on ASA, atorvastatin, Losartan and Coreg. Coumadin started for apical thrombus w/ heparin bridge.   Post cath, there were initial concerns for developing cardiogenic shock but he remained stable and did not require pressor/inotropic support. Co-ox remained stable. Repeat echo showed further reduction of LVEF down to 30-35% but no post MI mechanical complications. RV mildly reduced. He did require IV Lasix for pulmonary edema and volume status and dyspnea improved w/ diuresis. He was continued on GTDMT w/ losartan, spironolactone and Coreg. BP too soft for Entresto. He was treated w/ colchicine for post MI pericarditis w/ improvement in pleuritic chest pain. Given anterior MI w/ EF <35%, he was fitted w/ a LifeVest prior to discharge w/ plans to get repeat echo in 1 month.   He had repeat echo in 9/21, showing EF 25-30%.  He had [place] placed in 11/21.  Echo in 8/22 showed EF 30% with no LV thrombus, normal RV, normal IVC, moderate AS with mean gradient 28 mmHg and AVA 1.08 cm^2. Echo in 12/22 showed EF 30-35%, no LV thrombus, possibly bicuspid aortic valve with  mean gradient 34 mmHg and AVA 1.02 cm^2, normal RV, IVC normal.   Echo was done in 8/23 showing LV thrombus.  INR goal on warfarin was increased to 2.5-3.5.  Echo 10/23 showed EF remains 25-30% with peri-apical akinesis and a chronic-appearing LV thrombus, severe low flow/low gradient aortic stenosis (mean gradient 27 mmHg with AVA 0.88 cm^2), bicuspid aortic valve, normal RV, normal IVC. INR increased to 3-3.5.  We have discussed barostimulation activation therapy, but Medicaid does not have good coverage for this.   Follow up 10/23, continued with NYHA II-III symptoms. Referred to structural heart team for TAVR work up, underwent L/RHC showing chronic occlusion of mLAD without target and moderate RCA stenosis, severe AS and normal right heart pressures. Decided SAVR was best option with LV thrombus.  Seen in ED 01/11/22 with acute ETOH intoxication and LOC. BP low, given IVF with improvement. Discharged home.  In 1/24, he had AVR with [employer] mechanical AoV.  Post-op, he was taken off dapagliflozin, Entresto, and spironolactone. He quit smoking a couple of weeks before AVR.   Patient returns for followup of CHF.  BP is mildly elevated.  Weight is up 3 lbs. Sternotomy is minimally sore.  He is tired in general, short of breath walking up hills and stairs.  No dyspnea walking on flat ground. No PND. Mild orthopnea. He has not been taking Lasix.   [employer] device interrogation (personally reviewed): HL Score 38  Labs (9/21): K 4.3, creatinine 0.93 Labs (11/21): LDL 67, HDL 37, K 4.4, creatinine 0.81 Labs (1/22): digoxin 0.5,  K 4, creatinine 1.1.  Labs (5/22): digoxin 0.9, K 4.4, creatinine 1.06 Labs (8/22): K 4.3, creatinine 0.91, TGs 343, LDL 65, HDL 31, digoxin 1.0 Labs (12/22): K 4.1, creatinine 1.14, pro-BNP 120 Labs (1/23): hgb 14.7, LDL 66, TGs 99 Labs (5/23): digoxin 0.3, K 4.3, creatinine 1.17 Labs (8/23): digoxin 0.2, K 4.5, creatinine 1.17 Labs (12/23): K 5.3, creatinine  1.07, hgb 15.6, LDL 36 Labs (1/24): K 3.9, creatinine 1.08  ECG (personally reviewed): NSR 101, LAFB, inferior and anterolateral Qs  PMH: 1. GERD 2. Depression  3. CAD: Anterior STEMI in 8/21 with totally occluded LAD/no collaterals, 40% mid RCA.  Due to late presentation, no PCI was done.  - Had post-infarct pericarditis.  4. Chronic systolic CHF: Ischemic cardiomyopathy. [place].  - Echo (9/21) with EF 25-30%, normal RV, moderate AS with mean gradient 22 mmHg.  - Echo (8/22): EF 30% with no LV thrombus, normal RV, normal IVC, moderate AS with mean gradient 28 mmHg and AVA 1.08 cm^2.  - Echo (12/22): EF 30-35%, no LV thrombus, possibly bicuspid aortic valve with mean gradient 34 mmHg and AVA 1.02 cm^2, normal RV, IVC normal.  - Echo (10/23): EF remains 25-30% with peri-apical akinesis and a chronic-appearing LV thrombus, severe low flow/low gradient aortic stenosis (mean gradient 27 mmHg with AVA 0.88 cm^2), bicuspid aortic valve, normal RV, normal IVC. - R/LHC (11/23): mLAD 100% stenosed, pRCA 50% stenosed; RA mean 3, PA 25/14 mean 18, PCWP 13, CO/CI (Fick) 5.03/2.54 5. LV thrombus: Apical thrombus noted on echo at time of MI in 8/21. Resolved by 9/21 echo.  - Echo 10/23 with chronic-appearing, immobile LV thrombus.  6. Aortic stenosis: Bicuspid valve. Moderate on 12/22 echo.  - 10/23 echo showed low flow/low gradient severe AS.   7. Prior smoker.   Current Outpatient Medications  Medication Sig Dispense Refill   acetaminophen (TYLENOL) 500 MG tablet Take 500 mg by mouth every 6 (six) hours as needed for headache.     aspirin 81 MG chewable tablet Chew 1 tablet (81 mg total) by mouth daily. 90 tablet 3   atorvastatin (LIPITOR) 80 MG tablet Take 1 tablet (80 mg total) by mouth daily. 30 tablet 11   carvedilol (COREG) 6.25 MG tablet Take 1 tablet (6.25 mg total) by mouth 2 (two) times daily. 60 tablet 11   dapagliflozin propanediol (FARXIGA) 10 MG TABS tablet Take 1 tablet  (10 mg total) by mouth daily before breakfast. 30 tablet 11   digoxin (LANOXIN) 0.125 MG tablet Take 1/2 tablet (0.0625 mg total) by mouth daily. 30 tablet 6   ezetimibe (ZETIA) 10 MG tablet Take 1 tablet (10 mg total) by mouth daily. 90 tablet 0   fenofibrate 160 MG tablet Take 1 tablet (160 mg total) by mouth daily. 30 tablet 3   hydrOXYzine (ATARAX) 25 MG tablet Take 1 tablet (25 mg total) by mouth 3 (three) times daily as needed. 90 tablet 3   melatonin 5 MG TABS Take 10 mg by mouth at bedtime.     mirtazapine (REMERON) 45 MG tablet Take 1 tablet (45 mg total) by mouth at bedtime. 30 tablet 3   Multiple Vitamin (MULTIVITAMIN WITH MINERALS) TABS tablet Take 1 tablet by mouth daily.     omega-3 acid ethyl esters (LOVAZA) 1 g capsule TAKE 2 CAPSULES (2 GRAMS TOTAL) BY MOUTH TWO TIMES DAILY. 120 capsule 3   omeprazole (PRILOSEC) 20 MG capsule Take 20 mg by mouth daily before breakfast.     Probiotic  Product (PROBIOTIC DAILY PO) Take 1 capsule by mouth with breakfast, with lunch, and with evening meal.     sacubitril-valsartan (ENTRESTO) 49-51 MG Take 1 tablet by mouth 2 (two) times daily. 180 tablet 3   sertraline (ZOLOFT) 100 MG tablet Take 1.5 tablets (150 mg total) by mouth daily. 45 tablet 3   SILDENAFIL CITRATE PO Take 45-90 mg by mouth daily as needed (ED). Take 1 - 2 tablets 30 to 45 min before sexual activity as needed     spironolactone (ALDACTONE) 25 MG tablet Take 1 tablet (25 mg total) by mouth daily. 90 tablet 3   warfarin (COUMADIN) 5 MG tablet Take 1.5 tablets to 2 tablets by mouth once daily as directed by Anticoagulation clinic 55 tablet 3   furosemide (LASIX) 20 MG tablet Take 1 tablet (20 mg total) by mouth daily. 90 tablet 3   oxyCODONE (OXY IR/ROXICODONE) 5 MG immediate release tablet Take 1 tablet (5 mg total) by mouth every 4 (four) hours as needed for severe pain. (Patient not taking: Reported on 03/24/2022) 30 tablet 0   No current facility-administered medications for this  encounter.   No Known Allergies  Social History   Socioeconomic History   Marital status: Married    Spouse name: Not on file   Number of children: 0   Years of education: Not on file   Highest education level: Not on file  Occupational History   Occupation: Probation officer   Occupation: Attorney/Writer  Tobacco Use   Smoking status: Former    Packs/day: 0.25    Types: Cigarettes    Quit date: 02/15/2022    Years since quitting: 0.1   Smokeless tobacco: Never   Tobacco comments:    x15 yrs as of 2021  Vaping Use   Vaping Use: Never used  Substance and Sexual Activity   Alcohol use: Not Currently    Comment: a couple of drinks on the weekends   Drug use: Never   Sexual activity: Yes    Birth control/protection: None  Other Topics Concern   Not on file  Social History Narrative   2 adopted children   Social Determinants of Health   Financial Resource Strain: Not on file  Food Insecurity: No Food Insecurity (02/20/2022)   Hunger Vital Sign    Worried About Running Out of Food in the Last Year: Never true    [place] in the Last Year: Never true  Transportation Needs: No Transportation Needs (02/20/2022)   PRAPARE - Hydrologist (Medical): No    Lack of Transportation (Non-Medical): No  Physical Activity: Not on file  Stress: Not on file  Social Connections: Not on file  Intimate Partner Violence: Not At Risk (02/20/2022)   Humiliation, Afraid, Rape, and Kick questionnaire    Fear of Current or Ex-Partner: No    Emotionally Abused: No    Physically Abused: No    Sexually Abused: No   Family History  Problem Relation Age of Onset   Cancer Mother        peritoneal   Arrhythmia Father    Prostate cancer Father    Colon cancer Brother 98   Cancer Maternal Grandmother        type unknown   Heart attack Maternal Grandfather    Diabetes Paternal Grandmother    Heart attack Paternal Grandfather    BP (!) 138/94   Pulse (!) 110   Wt  87.9 kg (193 lb 12.8  oz)   SpO2 97%   BMI 28.62 kg/m   Wt Readings from Last 3 Encounters:  03/24/22 87.9 kg (193 lb 12.8 oz)  03/24/22 85.7 kg (189 lb)  03/03/22 85.7 kg (189 lb)   PHYSICAL EXAM: General: NAD Neck: JVP 8-9 cm, no thyromegaly or thyroid nodule.  Lungs: Mildly decreased BS CV: Nondisplaced PMI.  Heart regular S1/S2 with mechanical S2, no S3/S4, no murmur.  No peripheral edema.  No carotid bruit.  Normal pedal pulses.  Abdomen: Soft, nontender, no hepatosplenomegaly, no distention.  Skin: Intact without lesions or rashes.  Neurologic: Alert and oriented x 3.  Psych: Normal affect. Extremities: No clubbing or cyanosis.  HEENT: Normal.   ASSESSMENT & PLAN: 1. CAD: Admission 8/21 for acute anterior MI, delayed presentation with no ischemic pain, had pleuritic-type chest pain (post-MI pericarditis). LHC with totally occluded mid LAD w/o collaterals.  Suspected completed MI, no intervention. No ischemic chest pain.  - He is on ASA 81 daily, continue.  - Continue atorvastatin 80 mg daily. Good lipids 12/23.  2. Chronic systolic CHF: Ischemic Cardiomyopathy.  Reduced EF post anterior MI.  Echo in 9/21 with EF 25-30%, peri-apical akinesis, moderate AS. Cath w/ occluded mLAD but no intervention as he had completed his infarct. He has [place].  Echo in 8/22 with EF 30%.  Echo in 12/22 with EF 30-35%, normal RV, moderate AS. Echo 10/23 showed EF remains 25-30% with peri-apical akinesis and a chronic-appearing LV thrombus, severe low flow/low gradient aortic stenosis (mean gradient 27 mmHg with AVA 0.88 cm^2), bicuspid aortic valve, normal RV, normal IVC.  Now s/p mechanical AVR.  NYHA class II-III symptoms, he is volume overloaded by exam and [facility]. He has not been on Lasix. A number of medications were stopped after AVR.  - Start Lasix 20 mg daily.  BMET/BNP today and BMET in 10 days.    - Restart Entresto at 49/51 bid. - Restart spironolactone 25 mg daily.  -  Continue Coreg 6.25 mg bid.  - Continue dapagliflozin 10 mg daily.  - Restart digoxin 0.0625, check level today.   - High carotid bifurcation so cannot get Bat[place], unable to get commercial baroreceptor activation therapy as Medicaid will not cover.  3. Post MI Pericarditis: Resolved.  4. LV thrombus: Large LV thrombus noted on echo in 8/21, resolved on 9/21 echo.  Thrombus had recurred on 8/23 echo and warfarin was increased to INR goal 2.5-3.5 and ASA 81 was added.  Echo 10/23, there was still an LV thrombus, though it was smaller than in 8/23.  It is not mobile.  - Continue warfarin, INR goal to 2.5-3.5 and continue ASA 81 daily.  5. Tobacco use: He quit smoking in 1/24.  6. HLD: Good lipids in 12/23.  7. Aortic stenosis: Bicuspid aortic valve with moderate AS on 12/22 echo. Echo in 10/23 showed severe low flow/low gradient aortic stenosis with mean gradient 27 mmHg and AVA 0.88 cm^2.  He had SAVR 02/19/22/ with Dr. Lavonna Monarch.  - Continue ASA 81 + warfarin.  - Echo for baseline of new mechanical AoV.  - Refer to cardiac rehab post-op.   Follow up 3 wks with HF pharmacist for medication titration then see APP in 6 wks.   Loralie Champagne, MD  03/24/2022

## 2022-03-24 NOTE — Telephone Encounter (Signed)
Patient Advocate Encounter   Received notification from Medicaid that prior authorization for [place] is required.   PA submitted on [facility] [place] [id number] W Status is pending   Will continue to follow.

## 2022-03-24 NOTE — Progress Notes (Signed)
[address]       [place] [address]             [phone]           Craig Flores [place] Medical Record [id number] Date of Birth: 12-May-1965  [name], MD Pcp, No  Chief Complaint: SP mechanical AVR   History of Present Illness:     Pt is a 57 yo male who is about a month out from above surgery for bicuspid aortic stenosis low flow gradient with cardiomyopathy of EF 25%. Pt is doing well and is progressing with more energy each week. He has been contacted by Cardiac rehab and is going to set up schedule. He has been unemployed prior to surgery and is awaiting chance to reapply. He has no SOB and some lower sternal discomfort on occasion but he feels it is getting stronger. He is seeing Cardiology today. His INR has been recently theraputic      Past Medical History:  Diagnosis Date   Acid reflux    AICD (automatic cardioverter/defibrillator) present    Anxiety    Aortic stenosis with bicuspid valve    CHF (congestive heart failure) (HCC)    Depression    Dyspnea    GERD (gastroesophageal reflux disease)    HLD (hyperlipidemia)    Ischemic cardiomyopathy    Migraines    Myocardial infarction ([place]) 08/28/2019    Past Surgical History:  Procedure Laterality Date   AORTIC VALVE REPLACEMENT N/A 02/19/2022   Procedure: AORTIC VALVE REPLACEMENT (AVR) 43m SJM REGENT MECHANICAL AORTIC VALVE;  Surgeon: WCoralie Common MD;  [facility]  Service: Open Heart Surgery;  Laterality: N/A;   COLONOSCOPY WITH PROPOFOL N/A 07/16/2021   Procedure: COLONOSCOPY WITH PROPOFOL;  Surgeon: GGatha Mayer MD;  Location: [facility];  Service: Gastroenterology;  Laterality: N/A;   CORONARY/GRAFT ACUTE MI REVASCULARIZATION N/A 08/28/2019   Procedure: Coronary/Graft Acute MI Revascularization;  Surgeon: BLorretta Harp MD;  [facility];  Service: Cardiovascular;  Laterality: N/A;   ICD IMPLANT N/A 11/28/2019   Procedure: ICD IMPLANT;   Surgeon: KDeboraha Sprang MD;  [facility];  Service: Cardiovascular;  Laterality: N/A;   LEFT HEART CATH AND CORONARY ANGIOGRAPHY N/A 08/28/2019   Procedure: LEFT HEART CATH AND CORONARY ANGIOGRAPHY;  Surgeon: BLorretta Harp MD;  [facility];  Service: Cardiovascular;  Laterality: N/A;   POLYPECTOMY  07/16/2021   Procedure: POLYPECTOMY;  Surgeon: GGatha Mayer MD;  [facility];  Service: Gastroenterology;;   RIGHT HEART CATH AND CORONARY ANGIOGRAPHY N/A 12/12/2021   Procedure: RIGHT HEART CATH AND CORONARY ANGIOGRAPHY;  Surgeon: MBurnell Blanks MD;  [facility];  Service: Cardiovascular;  Laterality: N/A;   TEE WITHOUT CARDIOVERSION N/A 02/19/2022   Procedure: TRANSESOPHAGEAL ECHOCARDIOGRAM (TEE);  Surgeon: WCoralie Common MD;  [facility]  Service: Open Heart Surgery;  Laterality: N/A;   WISDOM TOOTH EXTRACTION      Social History   Tobacco Use  Smoking Status Every Day   Packs/day: 0.25   Types: Cigarettes  Smokeless Tobacco Never  Tobacco Comments   x15 yrs as of 2021    Social History   Substance and Sexual Activity  Alcohol Use Not Currently   Comment: a couple of drinks on the weekends    Social History   Socioeconomic History   Marital status: Married    Spouse name:  Not on file   Number of children: 0   Years of education: Not on file   Highest education level: Not on file  Occupational History   Occupation: Probation officer   Occupation: Attorney/Writer  Tobacco Use   Smoking status: Every Day    Packs/day: 0.25    Types: Cigarettes   Smokeless tobacco: Never   Tobacco comments:    x15 yrs as of 2021  Vaping Use   Vaping Use: Never used  Substance and Sexual Activity   Alcohol use: Not Currently    Comment: a couple of drinks on the weekends   Drug use: Never   Sexual activity: Yes    Birth control/protection: None  Other Topics Concern   Not on file  Social History Narrative    2 adopted children   Social Determinants of Health   Financial Resource Strain: Not on file  Food Insecurity: No Food Insecurity (02/20/2022)   Hunger Vital Sign    Worried About Running Out of Food in the Last Year: Never true    [place] in the Last Year: Never true  Transportation Needs: No Transportation Needs (02/20/2022)   PRAPARE - Hydrologist (Medical): No    Lack of Transportation (Non-Medical): No  Physical Activity: Not on file  Stress: Not on file  Social Connections: Not on file  Intimate Partner Violence: Not At Risk (02/20/2022)   Humiliation, Afraid, Rape, and Kick questionnaire    Fear of Current or Ex-Partner: No    Emotionally Abused: No    Physically Abused: No    Sexually Abused: No    No Known Allergies  Current Outpatient Medications  Medication Sig Dispense Refill   acetaminophen (TYLENOL) 500 MG tablet Take 500 mg by mouth every 6 (six) hours as needed for headache.     aspirin 81 MG chewable tablet Chew 1 tablet (81 mg total) by mouth daily. 90 tablet 3   atorvastatin (LIPITOR) 80 MG tablet Take 1 tablet (80 mg total) by mouth daily. 30 tablet 11   carvedilol (COREG) 6.25 MG tablet Take 1 tablet (6.25 mg total) by mouth 2 (two) times daily. 60 tablet 11   digoxin (LANOXIN) 0.125 MG tablet Take 1/2 tablet (0.0625 mg total) by mouth daily. 30 tablet 6   enoxaparin (LOVENOX) 80 MG/0.8ML injection Inject 0.8 mLs (80 mg total) into the skin every 12 (twelve) hours. 8 mL 1   ezetimibe (ZETIA) 10 MG tablet Take 1 tablet (10 mg total) by mouth daily. 90 tablet 0   fenofibrate 160 MG tablet Take 1 tablet (160 mg total) by mouth daily. 30 tablet 3   furosemide (LASIX) 20 MG tablet Take 1 tablet (20 mg total) by mouth daily. 90 tablet 3   hydrOXYzine (ATARAX) 25 MG tablet Take 1 tablet (25 mg total) by mouth 3 (three) times daily as needed. 90 tablet 3   melatonin 5 MG TABS Take 10 mg by mouth at bedtime.     mirtazapine (REMERON)  45 MG tablet Take 1 tablet (45 mg total) by mouth at bedtime. 30 tablet 3   Multiple Vitamin (MULTIVITAMIN WITH MINERALS) TABS tablet Take 1 tablet by mouth daily.     omega-3 acid ethyl esters (LOVAZA) 1 g capsule TAKE 2 CAPSULES (2 GRAMS TOTAL) BY MOUTH TWO TIMES DAILY. 120 capsule 3   omeprazole (PRILOSEC) 20 MG capsule Take 20 mg by mouth daily before breakfast.     oxyCODONE (OXY IR/ROXICODONE) 5 MG  immediate release tablet Take 1 tablet (5 mg total) by mouth every 4 (four) hours as needed for severe pain. 30 tablet 0   potassium chloride SA (KLOR-CON M) 20 MEQ tablet Take 1 tablet (20 mEq total) by mouth daily for 7 days 7 tablet 0   Probiotic Product (PROBIOTIC DAILY PO) Take 1 capsule by mouth with breakfast, with lunch, and with evening meal.     sertraline (ZOLOFT) 100 MG tablet Take 1.5 tablets (150 mg total) by mouth daily. 45 tablet 3   SILDENAFIL CITRATE PO Take 45-90 mg by mouth daily as needed (ED). Take 1 - 2 tablets 30 to 45 min before sexual activity as needed     warfarin (COUMADIN) 5 MG tablet Take 1.5 tablets to 2 tablets by mouth once daily as directed by Anticoagulation clinic 55 tablet 3   No current facility-administered medications for this visit.     Family History  Problem Relation Age of Onset   Cancer Mother        peritoneal   Arrhythmia Father    Prostate cancer Father    Colon cancer Brother 50   Cancer Maternal Grandmother        type unknown   Heart attack Maternal Grandfather    Diabetes Paternal Grandmother    Heart attack Paternal Grandfather        Physical Exam: Lungs: clear Card: crisp valve sounds. No murmur Incision: intact and stable Neuro:alert and no focal deficits Ext: no edema     Diagnostic Studies & Laboratory data: I have personally reviewed the following studies and agree with the findings     Recent Radiology Findings:   No CXR today    Recent Lab Findings: Lab Results  Component Value Date   WBC 5.4 02/24/2022    HGB 8.1 (L) 02/24/2022   HCT 23.7 (L) 02/24/2022   PLT 205 02/24/2022   GLUCOSE 108 (H) 02/24/2022   CHOL 131 01/13/2022   TRIG 293 (H) 01/13/2022   HDL 36 (L) 01/13/2022   LDLDIRECT 90.2 09/02/2019   LDLCALC 36 01/13/2022   ALT 27 02/17/2022   AST 24 02/17/2022   NA 134 (L) 02/24/2022   K 3.9 02/24/2022   CL 99 02/24/2022   CREATININE 1.08 02/24/2022   BUN 22 (H) 02/24/2022   CO2 25 02/24/2022   TSH 3.181 08/28/2019   INR 3.4 (A) 03/20/2022   HGBA1C 5.8 (H) 02/17/2022      Assessment / Plan:     Sp mechanical AVR and is doing well. We discussed need for cardiac rehab and restrictions over the next few months. He will see cardiology today for medical management of his cardiomyopathy moving forward. Pt happy with experience. Will follow up with [place] PRN   I have spent 20 min in review of the records, viewing studies and in face to face with patient and in coordination of future care    Coralie Common 03/24/2022 8:32 AM

## 2022-03-25 ENCOUNTER — Other Ambulatory Visit: Payer: Self-pay

## 2022-03-25 ENCOUNTER — Other Ambulatory Visit (HOSPITAL_COMMUNITY): Payer: Self-pay | Admitting: Cardiology

## 2022-03-26 ENCOUNTER — Other Ambulatory Visit: Payer: Self-pay

## 2022-03-26 ENCOUNTER — Encounter (HOSPITAL_COMMUNITY): Payer: Self-pay | Admitting: Psychiatry

## 2022-03-26 ENCOUNTER — Telehealth (INDEPENDENT_AMBULATORY_CARE_PROVIDER_SITE_OTHER): Payer: Medicaid Other | Admitting: Psychiatry

## 2022-03-26 DIAGNOSIS — F331 Major depressive disorder, recurrent, moderate: Secondary | ICD-10-CM

## 2022-03-26 DIAGNOSIS — F411 Generalized anxiety disorder: Secondary | ICD-10-CM

## 2022-03-26 MED ORDER — HYDROXYZINE HCL 25 MG PO TABS
25.0000 mg | ORAL_TABLET | Freq: Three times a day (TID) | ORAL | 3 refills | Status: DC | PRN
  Filled 2022-03-26 – 2022-05-09 (×2): qty 90, 30d supply, fill #0

## 2022-03-26 MED ORDER — SERTRALINE HCL 100 MG PO TABS
150.0000 mg | ORAL_TABLET | Freq: Every day | ORAL | 3 refills | Status: DC
  Filled 2022-03-26: qty 45, 30d supply, fill #0
  Filled 2022-04-28 – 2022-05-09 (×2): qty 45, 30d supply, fill #1
  Filled 2022-06-01: qty 45, 30d supply, fill #2

## 2022-03-26 MED ORDER — MIRTAZAPINE 45 MG PO TABS
45.0000 mg | ORAL_TABLET | Freq: Every day | ORAL | 3 refills | Status: DC
  Filled 2022-03-26: qty 30, 30d supply, fill #0
  Filled 2022-04-28 – 2022-05-09 (×2): qty 30, 30d supply, fill #1
  Filled 2022-06-01: qty 30, 30d supply, fill #2

## 2022-03-26 NOTE — Progress Notes (Signed)
BH MD/PA/NP OP Progress Note Virtual Visit via Video Note  I connected with Craig Flores on 03/26/22 at  2:00 PM EST by a video enabled telemedicine application and verified that I am speaking with the correct person using two identifiers.  Location: Patient: Home Provider: Clinic   I discussed the limitations of evaluation and management by telemedicine and the availability of in person appointments. The patient expressed understanding and agreed to proceed.  I provided 30 minutes of non-face-to-face time during this encounter.       03/26/2022 2:00 PM Craig Flores  MRN:  [id number]  Chief Complaint: "I am recovering from open heart surgery"  HPI:  57 year old male seen today for follow up psychiatric evaluation. He has a psychiatric history of alcohol dependence, substance induced mood disorder, anxiety, depression, and SI. He is currently being managed on Zoloft '150mg'$  daily,  hydroxyzine 25 mg three times daily as needed, and Mirtazapine 45 mg at nightly.  Patient also takes melatonin 5 mg provides it over-the-counter.  He notes that his medications are effective in managing his psychiatric conditions.   Today he was well-groomed, pleasant, cooperative, engaged in conversation.  He informed Probation officer that he is recovering from open heart surgery.  He notes that he has mild discomfort but reports that he is able to cope with it.  Since his last visit he notes that he is anxiety and depression has improved.  Provider conducted a GAD-7 and patient scored a 9, at his last visit he scored a 17.  Provider also conducted PHQ-9 patient scored a 7, at his last visit he scored a 12. He endorses adequate appetite and sleep.  Today he denies SI/HI/VAH, mania, paranoia.  Patient informed Probation officer that he has gained approximately 4 pounds since his last visit but notes that he feels that he is retaining fluid.  No medication changes made today.  Patient agreeable to continue medications as  prescribed.  No other concerns at this time.    Visit Diagnosis:  No diagnosis found.   Past Psychiatric History: alcohol dependence, substance induced mood disorder, anxiety, depression, and SI.   Past Medical History:  Past Medical History:  Diagnosis Date   Acid reflux    AICD (automatic cardioverter/defibrillator) present    Anxiety    Aortic stenosis with bicuspid valve    CHF (congestive heart failure) (HCC)    Depression    Dyspnea    GERD (gastroesophageal reflux disease)    HLD (hyperlipidemia)    Ischemic cardiomyopathy    Migraines    Myocardial infarction ([place]) 08/28/2019    Past Surgical History:  Procedure Laterality Date   AORTIC VALVE REPLACEMENT N/A 02/19/2022   Procedure: AORTIC VALVE REPLACEMENT (AVR) 50m SJM REGENT MECHANICAL AORTIC VALVE;  Surgeon: WCoralie Common MD;  [facility] [facility]  Service: Open Heart Surgery;  Laterality: N/A;   COLONOSCOPY WITH PROPOFOL N/A 07/16/2021   Procedure: COLONOSCOPY WITH PROPOFOL;  Surgeon: GGatha Mayer MD;  [facility];  Service: Gastroenterology;  Laterality: N/A;   CORONARY/GRAFT ACUTE MI REVASCULARIZATION N/A 08/28/2019   Procedure: Coronary/Graft Acute MI Revascularization;  Surgeon: BLorretta Harp MD;  [facility];  Service: Cardiovascular;  Laterality: N/A;   ICD IMPLANT N/A 11/28/2019   Procedure: ICD IMPLANT;  Surgeon: KDeboraha Sprang MD;  [facility];  Service: Cardiovascular;  Laterality: N/A;   LEFT HEART CATH AND CORONARY ANGIOGRAPHY N/A 08/28/2019   Procedure: LEFT HEART CATH AND CORONARY ANGIOGRAPHY;  Surgeon: [name], MD;  Location: [facility];  Service: Cardiovascular;  Laterality: N/A;   POLYPECTOMY  07/16/2021   Procedure: POLYPECTOMY;  Surgeon: Gatha Mayer, MD;  [facility];  Service: Gastroenterology;;   RIGHT HEART CATH AND CORONARY ANGIOGRAPHY N/A 12/12/2021   Procedure: RIGHT HEART CATH AND CORONARY ANGIOGRAPHY;   Surgeon: Burnell Blanks, MD;  Location: [facility] [facility];  Service: Cardiovascular;  Laterality: N/A;   TEE WITHOUT CARDIOVERSION N/A 02/19/2022   Procedure: TRANSESOPHAGEAL ECHOCARDIOGRAM (TEE);  Surgeon: Coralie Common, MD;  Location: [place];  Service: Open Heart Surgery;  Laterality: N/A;   WISDOM TOOTH EXTRACTION      Family Psychiatric History: Notes that his mother and aunt had mental health issues. Notes he belives they had anxiety  Family History:  Family History  Problem Relation Age of Onset   Cancer Mother        peritoneal   Arrhythmia Father    Prostate cancer Father    Colon cancer Brother 68   Cancer Maternal Grandmother        type unknown   Heart attack Maternal Grandfather    Diabetes Paternal Grandmother    Heart attack Paternal Grandfather     Social History:  Social History   Socioeconomic History   Marital status: Married    Spouse name: Not on file   Number of children: 0   Years of education: Not on file   Highest education level: Not on file  Occupational History   Occupation: Probation officer   Occupation: Attorney/Writer  Tobacco Use   Smoking status: Former    Packs/day: 0.25    Types: Cigarettes    Quit date: 02/15/2022    Years since quitting: 0.1   Smokeless tobacco: Never   Tobacco comments:    x15 yrs as of 2021  Vaping Use   Vaping Use: Never used  Substance and Sexual Activity   Alcohol use: Not Currently    Comment: a couple of drinks on the weekends   Drug use: Never   Sexual activity: Yes    Birth control/protection: None  Other Topics Concern   Not on file  Social History Narrative   2 adopted children   Social Determinants of Health   Financial Resource Strain: Not on file  Food Insecurity: No Food Insecurity (02/20/2022)   Hunger Vital Sign    Worried About Running Out of Food in the Last Year: Never true    [place] in the Last Year: Never true  Transportation Needs: No Transportation Needs (02/20/2022)    PRAPARE - Hydrologist (Medical): No    Lack of Transportation (Non-Medical): No  Physical Activity: Not on file  Stress: Not on file  Social Connections: Not on file    Allergies: No Known Allergies  Metabolic Disorder Labs: Lab Results  Component Value Date   HGBA1C 5.8 (H) 02/17/2022   MPG 119.76 02/17/2022   MPG 119.76 08/28/2019   No results found for: "PROLACTIN" Lab Results  Component Value Date   CHOL 131 01/13/2022   TRIG 293 (H) 01/13/2022   HDL 36 (L) 01/13/2022   CHOLHDL 3.6 01/13/2022   VLDL 59 (H) 01/13/2022   LDLCALC 36 01/13/2022   LDLCALC 66 02/18/2021   Lab Results  Component Value Date   TSH 3.181 08/28/2019    Therapeutic Level Labs: No results found for: "LITHIUM" No results found for: "VALPROATE" No results found for: "CBMZ"  Current Medications: Current Outpatient Medications  Medication Sig Dispense Refill   acetaminophen (TYLENOL) 500 MG tablet Take 500 mg by mouth every 6 (six) hours as needed for headache.     aspirin 81 MG chewable tablet Chew 1 tablet (81 mg total) by mouth daily. 90 tablet 3   atorvastatin (LIPITOR) 80 MG tablet Take 1 tablet (80 mg total) by mouth daily. 30 tablet 11   carvedilol (COREG) 6.25 MG tablet Take 1 tablet (6.25 mg total) by mouth 2 (two) times daily. 60 tablet 11   dapagliflozin propanediol (FARXIGA) 10 MG TABS tablet Take 1 tablet (10 mg total) by mouth daily before breakfast. 30 tablet 11   digoxin (LANOXIN) 0.125 MG tablet Take 1/2 tablet (0.0625 mg total) by mouth daily. 30 tablet 6   ezetimibe (ZETIA) 10 MG tablet Take 1 tablet (10 mg total) by mouth daily. 90 tablet 0   fenofibrate 160 MG tablet Take 1 tablet (160 mg total) by mouth daily. 30 tablet 3   furosemide (LASIX) 20 MG tablet Take 1 tablet (20 mg total) by mouth daily. 90 tablet 3   hydrOXYzine (ATARAX) 25 MG tablet Take 1 tablet (25 mg total) by mouth 3 (three) times daily as needed. 90 tablet 3   melatonin 5 MG  TABS Take 10 mg by mouth at bedtime.     mirtazapine (REMERON) 45 MG tablet Take 1 tablet (45 mg total) by mouth at bedtime. 30 tablet 3   Multiple Vitamin (MULTIVITAMIN WITH MINERALS) TABS tablet Take 1 tablet by mouth daily.     omega-3 acid ethyl esters (LOVAZA) 1 g capsule TAKE 2 CAPSULES (2 GRAMS TOTAL) BY MOUTH TWO TIMES DAILY. 120 capsule 3   omeprazole (PRILOSEC) 20 MG capsule Take 20 mg by mouth daily before breakfast.     oxyCODONE (OXY IR/ROXICODONE) 5 MG immediate release tablet Take 1 tablet (5 mg total) by mouth every 4 (four) hours as needed for severe pain. (Patient not taking: Reported on 03/24/2022) 30 tablet 0   Probiotic Product (PROBIOTIC DAILY PO) Take 1 capsule by mouth with breakfast, with lunch, and with evening meal.     sacubitril-valsartan (ENTRESTO) 49-51 MG Take 1 tablet by mouth 2 (two) times daily. 180 tablet 3   sertraline (ZOLOFT) 100 MG tablet Take 1.5 tablets (150 mg total) by mouth daily. 45 tablet 3   SILDENAFIL CITRATE PO Take 45-90 mg by mouth daily as needed (ED). Take 1 - 2 tablets 30 to 45 min before sexual activity as needed     spironolactone (ALDACTONE) 25 MG tablet Take 1 tablet (25 mg total) by mouth daily. 90 tablet 3   warfarin (COUMADIN) 5 MG tablet Take 1.5 tablets to 2 tablets by mouth once daily as directed by Anticoagulation clinic 55 tablet 3   No current facility-administered medications for this visit.     Musculoskeletal: Strength & Muscle Tone: WNL Gait & Station: normal Patient leans: N/A  Psychiatric Specialty Exam: Review of Systems  There were no vitals taken for this visit.There is no height or weight on file to calculate BMI.  General Appearance: Well Groomed  Eye Contact:  Good  Speech:  Clear and Coherent and Normal Rate  Volume:  Normal  Mood:  Euthymic  Affect:  Appropriate and Congruent  Thought Process:  Coherent, Goal Directed and Linear  Orientation:  Full (Time, Place, and Person)  Thought Content: WDL and  Logical   Suicidal Thoughts:  No  Homicidal Thoughts:  No  Memory:  Immediate;   Good Recent;   Good Remote;   Good  Judgement:  Good  Insight:  Good  Psychomotor Activity:  Normal  Concentration:  Concentration: Good and Attention Span: Good  Recall:  Good  Fund of Knowledge: Good  Language: Good  Akathisia:  No  Handed:  Right  AIMS (if indicated): Not done  Assets:  Communication Skills Desire for Improvement Financial Resources/Insurance Housing Intimacy Social Support  ADL's:  Intact  Cognition: WNL  Sleep:  Good   Screenings: AIMS    Flowsheet Row Admission (Discharged) from 04/26/2014 in [facility]  AIMS Total Score 0      AUDIT    Flowsheet Row Admission (Discharged) from 04/26/2014 in [facility]  Alcohol Use Disorder Identification Test Final Score (AUDIT) 19      GAD-7    Flowsheet Row Video Visit from 10/28/2021 in [facility] Video Visit from 07/25/2021 in [facility] Video Visit from 02/15/2021 in [facility] Video Visit from 11/23/2020 in [facility] Video Visit from 08/22/2020 in [facility]  Total GAD-7 Score '17 2 2 2 3      '$ PHQ2-9    Flowsheet Row Video Visit from 10/28/2021 in [facility] Video Visit from 07/25/2021 in [facility] Video Visit from 02/15/2021 in [facility] Video Visit from 11/23/2020 in [facility] Video Visit from 08/22/2020 in [facility]  PHQ-2 Total Score '5 1 2 1 '$ 0  PHQ-9 Total Score '12 2 9 3 3      '$ Flowsheet Row Admission (Discharged) from 02/19/2022 in [facility] 4E CV SURGICAL PROGRESSIVE CARE Pre-Admission Testing 60 from 02/17/2022 in [facility] PREADMISSION  TESTING ED from 01/11/2022 in [facility] [place] No Risk No Risk No Risk        Assessment and Plan: Patient notes that he is doing well on his current medication regimen.  No medication changes made today.  Patient agreeable to taking medication as prescribed.    1. Generalized anxiety disorder  Continue- hydrOXYzine (ATARAX) 25 MG tablet; Take 1 tablet (25 mg total) by mouth 3 (three) times daily as needed.  Dispense: 90 tablet; Refill: 3 Continue- sertraline (ZOLOFT) 100 MG tablet; Take 1.5 tablets (150 mg total) by mouth daily.  Dispense: 45 tablet; Refill: 3 Continue- mirtazapine (REMERON) 45 MG tablet; Take 1 tablet (45 mg total) by mouth at bedtime.  Dispense: 30 tablet; Refill: 3  2. Moderate episode of recurrent major depressive disorder (HCC)  Continue- sertraline (ZOLOFT) 100 MG tablet; Take 1.5 tablets (150 mg total) by mouth daily.  Dispense: 45 tablet; Refill: 3 Continue- mirtazapine (REMERON) 45 MG tablet; Take 1 tablet (45 mg total) by mouth at bedtime.  Dispense: 30 tablet; Refill: 3  Follow up in 3 months Follow up with therapy  Salley Slaughter, NP 03/26/2022, 2:00 PM

## 2022-03-26 NOTE — Telephone Encounter (Signed)
Advanced Heart Failure Patient Advocate Encounter  Prior Authorization for [place] has been approved.    PA# [id number] Effective dates: 02/26/254 through 03/24/23  Charlann Boxer, CPhT

## 2022-03-27 ENCOUNTER — Other Ambulatory Visit (HOSPITAL_COMMUNITY): Payer: Self-pay

## 2022-03-27 ENCOUNTER — Other Ambulatory Visit: Payer: Self-pay

## 2022-03-27 MED ORDER — FUROSEMIDE 20 MG PO TABS
20.0000 mg | ORAL_TABLET | Freq: Every day | ORAL | 3 refills | Status: DC
  Filled 2022-03-27: qty 90, 90d supply, fill #0
  Filled 2022-06-01: qty 90, 90d supply, fill #1

## 2022-04-04 ENCOUNTER — Ambulatory Visit: Payer: Medicaid Other

## 2022-04-04 ENCOUNTER — Ambulatory Visit (HOSPITAL_COMMUNITY)
Admission: RE | Admit: 2022-04-04 | Discharge: 2022-04-04 | Disposition: A | Discharge status: Home / Self Care | Payer: Medicaid Other | Source: Ambulatory Visit | Attending: Internal Medicine | Admitting: Internal Medicine

## 2022-04-04 DIAGNOSIS — I5022 Chronic systolic (congestive) heart failure: Secondary | ICD-10-CM | POA: Insufficient documentation

## 2022-04-04 LAB — BASIC METABOLIC PANEL
Anion gap: 9 (ref 5–15)
BUN: 18 mg/dL (ref 6–20)
CO2: 26 mmol/L (ref 22–32)
Calcium: 9.6 mg/dL (ref 8.9–10.3)
Chloride: 104 mmol/L (ref 98–111)
Creatinine, Ser: 1.18 mg/dL (ref 0.61–1.24)
GFR, Estimated: >60 mL/min (ref >60)
Glucose, Bld: 127 mg/dL — ABNORMAL HIGH (ref 70–99)
Potassium: 4.2 mmol/L (ref 3.5–5.1)
Sodium: 139 mmol/L (ref 135–145)

## 2022-04-04 LAB — PROTIME-INR
INR: 3.1 — ABNORMAL HIGH (ref 0.8–1.2)
Prothrombin Time: 31.5 seconds — ABNORMAL HIGH (ref 11.4–15.2)

## 2022-04-14 NOTE — Progress Notes (Incomplete)
***In Progress***    Advanced Heart Failure Clinic Note   PCP: None HF Cardiology: Dr. Aundra Dubin  HPI:  57 y.o. male smoker was admitted on 08/28/19 for delayed presentation anterior STEMI. Had had >36 hr of chest pain prior to seeking medical attention. Pain progressed to more pleuritic like CP. Found to have anterior ST elevations w/ precordial Q waves on admit. Hs troponin >27,000. Urgent cardiac cath showed totally occluded mid LAD w/o collaterals and 40% mid RCA stenosis. It was suspected he had completed his infarct and residual pleuritic CP was post-MI pericarditis. No intervention was performed. 2D echo demonstrated moderately reduced LVEF, 45-50%, + apical thrombus and moderate AS. MV normal. RV systolic function mildly reduced.    He was placed on medical management for CAD and systolic HF. He was started on ASA, atorvastatin, Losartan and Coreg. Coumadin started for apical thrombus w/ heparin bridge.    Post cath, there were initial concerns for developing cardiogenic shock but he remained stable and did not require pressor/inotropic support. Co-ox remained stable. Repeat echo showed further reduction of LVEF down to 30-35% but no post MI mechanical complications. RV mildly reduced. He did require IV Lasix for pulmonary edema and volume status and dyspnea improved w/ diuresis. He was continued on GTDMT w/ losartan, spironolactone and carvedilol. BP too soft for Entresto. He was treated w/ colchicine for post MI pericarditis w/ improvement in pleuritic chest pain. Given anterior MI w/ EF <35%, he was fitted w/ a LifeVest prior to discharge w/ plans to get repeat echo in 1 month.    He had repeat echo in 09/2019, showing EF 25-30%.  He had [employer] ICD placed in 11/2019.  Echo in 08/2020 showed EF 30% with no LV thrombus, normal RV, normal IVC, moderate AS with mean gradient 28 mmHg and AVA 1.08 cm^2. Echo in 12/2020 showed EF 30-35%, no LV thrombus, possibly bicuspid aortic valve with mean  gradient 34 mmHg and AVA 1.02 cm^2, normal RV, IVC normal.    Echo was done in 08/2021 showing LV thrombus. INR goal on warfarin was increased to 2.5-3.5.  Echo 10/2021 showed EF remains 25-30% with peri-apical akinesis and a chronic-appearing LV thrombus, severe low flow/low gradient aortic stenosis (mean gradient 27 mmHg with AVA 0.88 cm^2), bicuspid aortic valve, normal RV, normal IVC. INR increased to 3-3.5.   Had previously discussed barostimulation activation therapy, but Medicaid does not have good coverage for this.    Follow up 10/2021, continued with NYHA II-III symptoms. Referred to structural heart team for TAVR work up, underwent L/RHC showing chronic occlusion of mLAD without target and moderate RCA stenosis, severe AS and normal right heart pressures. Decided SAVR was best option with LV thrombus.   Seen in ED 01/11/22 with acute ETOH intoxication and LOC. BP low, given IVF with improvement. Discharged home.   In 01/2022, he had AVR with [employer] mechanical AoV.  Post-op, he was taken off [place], [place], and spironolactone. He quit smoking a couple of weeks before AVR.    Patient returned for follow-up of CHF on 03/24/22.  BP was mildly elevated. Weight was up 3 lbs. Sternotomy was minimally sore.  He was tired in general, short of breath walking up hills and stairs. No dyspnea walking on flat ground. No PND. Mild orthopnea. He had not been taking Lasix.   Today he returns to HF clinic for pharmacist medication titration. At last visit with MD, Lasix 20 mg daily was initiated, Entresto 49/51 mg BID was  restarted, spironolactone 25 mg daily was restarted, and digoxin 0.0625 mg daily was restarted.   Overall feeling ***. Dizziness, lightheadedness, fatigue:  Chest pain or palpitations:  How is your breathing?: *** SOB: Able to complete all ADLs. Activity level ***  Weight at home pounds. Takes furosemide/torsemide/bumex *** mg *** daily.  LEE PND/Orthopnea  Appetite  *** Low-salt diet:   Physical Exam Cost/affordability of meds  HF Medications: Carvedilol 6.25 mg BID  Enresto 49/51 mg BID  Spironolactone 25 mg daily Farxiga 10 mg daily Digoxin 0.0625 mg daily Lasix 20 mg daily  Has the patient been experiencing any side effects to the medications prescribed?  {YES NO:22349}  Does the patient have any problems obtaining medications due to transportation or finances?   [place] Medicaid  Understanding of regimen: {excellent/good/fair/poor:19665} Understanding of indications: {excellent/good/fair/poor:19665} Potential of compliance: {excellent/good/fair/poor:19665} Patient understands to avoid NSAIDs. Patient understands to avoid decongestants.    Pertinent Lab Values: 04/04/22: Serum creatinine 1.18, BUN 18, Potassium 4.2, Sodium 139, Digoxin <0.2 (03/24/22)   Vital Signs: Weight: *** (last clinic weight: 193 lbs) Blood pressure: ***  Heart rate: ***   Assessment/Plan: 1. CAD: Admission 08/2019 for acute anterior MI, delayed presentation with no ischemic pain, had pleuritic-type chest pain (post-MI pericarditis). LHC with totally occluded mid LAD w/o collaterals.  Suspected completed MI, no intervention. No ischemic chest pain.  - Continue ASA 81 mg daily. - Continue atorvastatin 80 mg daily. Good lipids [id number].   2. Chronic systolic CHF: Ischemic Cardiomyopathy.  Reduced EF post anterior MI.  Echo in 09/2019 with EF 25-30%, peri-apical akinesis, moderate AS. Cath w/ occluded mLAD but no intervention as he had completed his infarct. He has [place].  Echo in 08/2020 with EF 30%.  Echo in 12/2020 with EF 30-35%, normal RV, moderate AS. Echo 10/2021 showed EF remains 25-30% with peri-apical akinesis and a chronic-appearing LV thrombus, severe low flow/low gradient aortic stenosis (mean gradient 27 mmHg with AVA 0.88 cm^2), bicuspid aortic valve, normal RV, normal IVC.  Now s/p mechanical AVR.  NYHA class II-III symptoms, he is volume overloaded  by exam and [employer]. *** He has not been on Lasix. A number of medications were stopped after AVR.  - Start Lasix 20 mg daily.  BMET/BNP today and BMET in 10 days.    - Entresto at 49/51 BID *** - spironolactone 25 mg daily. *** - Continue carvedilol 6.25 mg bid.  *** - Continue Farxiga 10 mg daily. *** - Continue digoxin 0.0625 mg daily.  *** - High carotid bifurcation so cannot get Batwire, unable to get commercial baroreceptor activation therapy as Medicaid will not cover.   3. Post MI Pericarditis: Resolved.   4. LV thrombus: Large LV thrombus noted on echo in 08/2019, resolved on 9/21 echo.  Thrombus had recurred on 08/2021 echo and warfarin was increased to INR goal 2.5-3.5 and ASA 81 was added.  Echo 10/2021, there was still an LV thrombus, though it was smaller than in 08/2021.  It is not mobile.  - Continue warfarin, INR goal to 2.5-3.5 and continue ASA 81 daily.   5. Tobacco use: He quit smoking in 01/2022.   6. HLD: Good lipids in 12/2021.  - Continue atorvastatin 80 mg daily, ezetimibe 10 mg daily, and fenofibrate 160 mg daily.   7. Aortic stenosis: Bicuspid aortic valve with moderate AS on 12/22 echo. Echo in 10/23 showed severe low flow/low gradient aortic stenosis with mean gradient 27 mmHg and AVA 0.88 cm^2.  He had  SAVR 02/19/22 with Dr. Lavonna Monarch.  - Continue ASA 81 + warfarin.  - Echo for baseline of new mechanical AoV.  - Refer to cardiac rehab post-op.  Follow up with APP on 05/06/22.   Sinda Du, PharmD Candidate  Audry Riles, PharmD, BCPS, BCCP, CPP Heart Failure Clinic Pharmacist [phone]

## 2022-04-15 ENCOUNTER — Other Ambulatory Visit: Payer: Self-pay

## 2022-04-15 ENCOUNTER — Ambulatory Visit (HOSPITAL_COMMUNITY)
Admission: RE | Admit: 2022-04-15 | Discharge: 2022-04-15 | Disposition: A | Discharge status: Home / Self Care | Payer: Medicaid Other | Source: Ambulatory Visit | Attending: Cardiology | Admitting: Cardiology

## 2022-04-15 VITALS — BP 124/78 | HR 100 | Wt 193.4 lb

## 2022-04-15 DIAGNOSIS — Z7982 Long term (current) use of aspirin: Secondary | ICD-10-CM | POA: Insufficient documentation

## 2022-04-15 DIAGNOSIS — I2510 Atherosclerotic heart disease of native coronary artery without angina pectoris: Secondary | ICD-10-CM | POA: Diagnosis not present

## 2022-04-15 DIAGNOSIS — Z87891 Personal history of nicotine dependence: Secondary | ICD-10-CM | POA: Diagnosis not present

## 2022-04-15 DIAGNOSIS — I110 Hypertensive heart disease with heart failure: Secondary | ICD-10-CM | POA: Insufficient documentation

## 2022-04-15 DIAGNOSIS — E785 Hyperlipidemia, unspecified: Secondary | ICD-10-CM | POA: Diagnosis not present

## 2022-04-15 DIAGNOSIS — Q231 Congenital insufficiency of aortic valve: Secondary | ICD-10-CM | POA: Diagnosis not present

## 2022-04-15 DIAGNOSIS — I5022 Chronic systolic (congestive) heart failure: Secondary | ICD-10-CM | POA: Diagnosis not present

## 2022-04-15 DIAGNOSIS — I8290 Acute embolism and thrombosis of unspecified vein: Secondary | ICD-10-CM | POA: Diagnosis not present

## 2022-04-15 MED ORDER — ENTRESTO 97-103 MG PO TABS
1.0000 | ORAL_TABLET | Freq: Two times a day (BID) | ORAL | 11 refills | Status: DC
  Filled 2022-04-15: qty 60, 30d supply, fill #0
  Filled 2022-06-01: qty 60, 30d supply, fill #1
  Filled 2022-06-30: qty 60, 30d supply, fill #2
  Filled 2022-08-02: qty 60, 30d supply, fill #3
  Filled 2022-08-31: qty 60, 30d supply, fill #4
  Filled 2022-09-27: qty 60, 30d supply, fill #5
  Filled 2022-10-25: qty 60, 30d supply, fill #6
  Filled 2022-11-30: qty 60, 30d supply, fill #7
  Filled 2022-12-27: qty 60, 30d supply, fill #8
  Filled 2023-01-25: qty 60, 30d supply, fill #9
  Filled 2023-03-01: qty 60, 30d supply, fill #10
  Filled 2023-03-29: qty 60, 30d supply, fill #11

## 2022-04-15 NOTE — Progress Notes (Signed)
Advanced Heart Failure Clinic Note   PCP: None HF Cardiology: Dr. Aundra Dubin  HPI:  57 y.o. male smoker was admitted on 08/28/19 for delayed presentation anterior STEMI. Had had >36 hr of chest pain prior to seeking medical attention. Pain progressed to more pleuritic like CP. Found to have anterior ST elevations w/ precordial Q waves on admit. Hs troponin >27,000. Urgent cardiac cath showed totally occluded mid LAD w/o collaterals and 40% mid RCA stenosis. It was suspected he had completed his infarct and residual pleuritic CP was post-MI pericarditis. No intervention was performed. 2D echo demonstrated moderately reduced LVEF, 45-50%, + apical thrombus and moderate AS. MV normal. RV systolic function mildly reduced.   He was placed on medical management for CAD and systolic HF. He was started on ASA, atorvastatin, Losartan and carvedilol. Warfarin started for apical thrombus w/ heparin bridge.    Post cath, there were initial concerns for developing cardiogenic shock but he remained stable and did not require pressor/inotropic support. Co-ox remained stable. Repeat echo showed further reduction of LVEF down to 30-35% but no post MI mechanical complications. RV mildly reduced. He did require IV Lasix for pulmonary edema and volume status and dyspnea improved w/ diuresis. He was continued on GDMT w/ losartan, spironolactone and carvedilol. BP too soft for Entresto. He was treated w/ colchicine for post MI pericarditis w/ improvement in pleuritic chest pain. Given anterior MI w/ EF <35%, he was fitted w/ a LifeVest prior to discharge w/ plans to get repeat echo in 1 month.    He had repeat echo in 09/2019, showing EF 25-30%.  He had [employer] ICD placed in 11/2019.  Echo in 08/2020 showed EF 30% with no LV thrombus, normal RV, normal IVC, moderate AS with mean gradient 28 mmHg and AVA 1.08 cm^2. Echo in 12/2020 showed EF 30-35%, no LV thrombus, possibly bicuspid aortic valve with mean gradient 34  mmHg and AVA 1.02 cm^2, normal RV, IVC normal.    Echo was done in 08/2021 showing LV thrombus. INR goal on warfarin was increased to 2.5-3.5.  Echo 10/2021 showed EF remains 25-30% with peri-apical akinesis and a chronic-appearing LV thrombus, severe low flow/low gradient aortic stenosis (mean gradient 27 mmHg with AVA 0.88 cm^2), bicuspid aortic valve, normal RV, normal IVC. INR increased to 3-3.5.   Had previously discussed barostimulation activation therapy, but Medicaid does not have good coverage for this.    Follow up 10/2021, continued with NYHA II-III symptoms. Referred to structural heart team for TAVR work up, underwent L/RHC showing chronic occlusion of mLAD without target and moderate RCA stenosis, severe AS and normal right heart pressures. Decided SAVR was best option with LV thrombus.   Seen in ED 01/11/22 with acute ETOH intoxication and LOC. BP low, given IVF with improvement. Discharged home.   In 01/2022, he had AVR with [employer] mechanical AoV.  Post-op, he was taken off [place], [place], and spironolactone. He quit smoking a couple of weeks before AVR.    Patient returned for follow-up of CHF on 03/24/22.  BP was mildly elevated. Weight was up 3 lbs. Sternotomy was minimally sore.  He was tired in general, short of breath walking up hills and stairs. No dyspnea walking on flat ground. No PND. Mild orthopnea. He had not been taking Lasix.   Today he returns to HF clinic for pharmacist medication titration. At last visit with MD, Lasix 20 mg daily was initiated, Entresto 49/51 mg BID was restarted, spironolactone 25 mg  daily was restarted, and digoxin 0.0625 mg daily was restarted. Overall feeling as if improving since surgery. Reports incision site is healing well and soreness has much improved, however does have some residual soreness. Reports occasional orthostasis when standing up too quickly in the evening, about once every few weeks. Has had consistent fatigue since MI  and does  feel as if he is is no longer able to work as long, is not as sharp mentally and cannot concentrate for long periods of time. Denies chest pain and palpitations. Does feel heart fluttering when bending over, but attributes that to valve. Occasionally checks BP at home, readings around 120/75-80. SOB is improved s/p SAVR but has been about the same since last MD visit. Is able to walk 20-25 minutes daily on flat surface. Does get SOB when going up stairs or incline too quickly. Able to complete all ADLs. Weight at home ~187 pounds. Takes Lasix 20 mg daily. Did take Lasix 20 mg BID x 3 days following MD visit. Feels as if this improved SOB and heavy feeling for a short time. Otherwise, feels as if SOB has not improved since Lasix initiation at last visit. No LEE on exam today. Utilizes a husband pillow while sleeping. Denies PND/orthopnea. Appetite has been normal. Tries to follow low-salt diet.   HF Medications: Carvedilol 6.25 mg BID Enresto 49/51 mg BID  Spironolactone 25 mg daily Farxiga 10 mg daily Digoxin 0.0625 mg daily Lasix 20 mg daily  Has the patient been experiencing any side effects to the medications prescribed? No  Does the patient have any problems obtaining medications due to transportation or finances? [place] Medicaid -- traditional; PA for [place] approved  Understanding of regimen: excellent Understanding of indications: excellent Potential of compliance: excellent Patient understands to avoid NSAIDs. Patient understands to avoid decongestants.    Pertinent Lab Values: 04/04/22: Serum creatinine 1.18, BUN 18, Potassium 4.2, Sodium 139, Digoxin <0.2 (03/24/22)   Vital Signs: Weight: 193.4 lbs (last clinic weight: 193 lbs) Blood pressure: 124/78 mm Hg Heart rate: 100 BPM  Assessment/Plan: 1. CAD: Admission 08/2019 for acute anterior MI, delayed presentation with no ischemic pain, had pleuritic-type chest pain (post-MI pericarditis). LHC with totally occluded mid LAD  w/o collaterals.  Suspected completed MI, no intervention. No ischemic chest pain.  - Continue ASA 81 mg daily. - Continue atorvastatin 80 mg daily. Good lipids 12/2021.   2. Chronic systolic CHF: Ischemic Cardiomyopathy.  Reduced EF post anterior MI.  Echo in 09/2019 with EF 25-30%, peri-apical akinesis, moderate AS. Cath w/ occluded mLAD but no intervention as he had completed his infarct. He has [place].  Echo in 08/2020 with EF 30%.  Echo in 12/2020 with EF 30-35%, normal RV, moderate AS. Echo 10/2021 showed EF remains 25-30% with peri-apical akinesis and a chronic-appearing LV thrombus, severe low flow/low gradient aortic stenosis (mean gradient 27 mmHg with AVA 0.88 cm^2), bicuspid aortic valve, normal RV, normal IVC.  Now s/p mechanical AVR.  NYHA class II-III symptoms, fluid status mildly elevated. - Take Lasix 40 mg daily x 3 days then resume Lasix 20 mg daily.    - Continue carvedilol 6.25 mg BID. - Increase Entresto to 97/103 mg BID. Repeat BMET in 3 weeks at follow up.   - Continue spironolactone 25 mg daily.  - Continue Farxiga 10 mg daily.  - Continue digoxin 0.0625 mg daily. Repeat digoxin level at follow up.  - High carotid bifurcation so cannot get [place], unable to get commercial  baroreceptor activation therapy as Medicaid will not cover.   3. Post MI Pericarditis: Resolved.   4. LV thrombus: Large LV thrombus noted on echo in 08/2019, resolved on 09/2019 echo.  Thrombus had recurred on 08/2021 echo and warfarin was increased to INR goal 2.5-3.5 and ASA 81 mg daily was added.  Echo 10/2021, there was still an LV thrombus, though it was smaller than in 08/2021.  It is not mobile.  - Continue warfarin, INR goal 2.5-3.5, and continue ASA 81 mg daily.   5. Tobacco use: He quit smoking in 01/2022.   6. HLD: Good lipids in 12/2021.  - Continue atorvastatin 80 mg daily, ezetimibe 10 mg daily, and fenofibrate 160 mg daily.   7. Aortic stenosis: Bicuspid aortic valve with  moderate AS on 12/2020 echo. Echo in 10/2021 showed severe low flow/low gradient aortic stenosis with mean gradient 27 mmHg and AVA 0.88 cm^2.  He had SAVR 02/19/22 with Dr. Lavonna Monarch.  - Continue ASA 81 mg daily + warfarin as above (#4) - Echo for baseline of new mechanical AoV.  - Referred to cardiac rehab post-op.  Follow up with APP on 05/06/22.   Sinda Du, PharmD Candidate  Audry Riles, PharmD, BCPS, BCCP, CPP Heart Failure Clinic Pharmacist [phone]

## 2022-04-15 NOTE — Patient Instructions (Addendum)
It was a pleasure seeing you today!  MEDICATIONS: -We are changing your medications today -Take Lasix 40 mg (2 tablets) for 3 days then resume 20 mg (1 tablet) daily -Increase Entresto to 97/103 mg (1 tablet) twice daily. You may take 2 tablets of the 49/51 mg strength twice daily until you pick up the new strength.  -Call if you have questions about your medications.  NEXT APPOINTMENT: Return to clinic in 3 weeks with APP [facility].  In general, to take care of your heart failure: -Limit your fluid intake to 2 Liters (half-gallon) per day.   -Limit your salt intake to ideally 2-3 grams (2000-3000 mg) per day. -Weigh yourself daily and record, and bring that "weight diary" to your next appointment.  (Weight gain of 2-3 pounds in 1 day typically means fluid weight.) -The medications for your heart are to help your heart and help you live longer.   -Please contact us before stopping any of your heart medications.  Call the clinic at [phone] with questions or to reschedule future appointments.

## 2022-04-20 MED FILL — Warfarin Sodium Tab 5 MG: ORAL | 27 days supply | Qty: 55 | Fill #1 | Status: AC

## 2022-04-21 ENCOUNTER — Other Ambulatory Visit: Payer: Self-pay

## 2022-04-22 ENCOUNTER — Other Ambulatory Visit (HOSPITAL_COMMUNITY): Payer: Self-pay | Admitting: Family Medicine

## 2022-04-22 ENCOUNTER — Other Ambulatory Visit: Payer: Self-pay

## 2022-04-23 ENCOUNTER — Encounter (HOSPITAL_COMMUNITY): Payer: Self-pay

## 2022-04-24 ENCOUNTER — Other Ambulatory Visit: Payer: Self-pay

## 2022-04-24 ENCOUNTER — Other Ambulatory Visit (HOSPITAL_COMMUNITY): Payer: Self-pay

## 2022-04-24 MED ORDER — CARVEDILOL 6.25 MG PO TABS
6.2500 mg | ORAL_TABLET | Freq: Two times a day (BID) | ORAL | 6 refills | Status: DC
  Filled 2022-04-24: qty 60, 30d supply, fill #0
  Filled 2022-05-25: qty 60, 30d supply, fill #1
  Filled 2022-06-23: qty 60, 30d supply, fill #2
  Filled 2022-07-20: qty 60, 30d supply, fill #3
  Filled 2022-08-25: qty 60, 30d supply, fill #4
  Filled 2022-09-20: qty 60, 30d supply, fill #5
  Filled 2022-10-20: qty 60, 30d supply, fill #6

## 2022-04-28 ENCOUNTER — Other Ambulatory Visit: Payer: Self-pay

## 2022-05-01 ENCOUNTER — Encounter: Payer: Self-pay | Admitting: Pharmacist

## 2022-05-02 ENCOUNTER — Other Ambulatory Visit: Payer: Self-pay

## 2022-05-06 ENCOUNTER — Encounter (HOSPITAL_COMMUNITY): Payer: Medicaid Other

## 2022-05-09 ENCOUNTER — Other Ambulatory Visit: Payer: Self-pay

## 2022-05-14 ENCOUNTER — Other Ambulatory Visit: Payer: Self-pay

## 2022-05-19 ENCOUNTER — Other Ambulatory Visit: Payer: Self-pay

## 2022-05-25 MED FILL — Warfarin Sodium Tab 5 MG: ORAL | 27 days supply | Qty: 55 | Fill #2 | Status: AC

## 2022-05-26 ENCOUNTER — Other Ambulatory Visit: Payer: Self-pay

## 2022-05-30 ENCOUNTER — Encounter (HOSPITAL_COMMUNITY): Payer: Self-pay

## 2022-05-30 ENCOUNTER — Telehealth (HOSPITAL_COMMUNITY): Payer: Self-pay

## 2022-05-30 NOTE — Telephone Encounter (Signed)
Attempted to call patient in regards to Cardiac Rehab - LM on VM Mailed letter 

## 2022-06-02 ENCOUNTER — Other Ambulatory Visit: Payer: Self-pay

## 2022-06-03 ENCOUNTER — Other Ambulatory Visit: Payer: Self-pay

## 2022-06-06 ENCOUNTER — Telehealth (INDEPENDENT_AMBULATORY_CARE_PROVIDER_SITE_OTHER): Payer: Medicaid Other | Admitting: Psychiatry

## 2022-06-06 ENCOUNTER — Other Ambulatory Visit: Payer: Self-pay

## 2022-06-06 ENCOUNTER — Encounter (HOSPITAL_COMMUNITY): Payer: Self-pay | Admitting: Psychiatry

## 2022-06-06 DIAGNOSIS — F411 Generalized anxiety disorder: Secondary | ICD-10-CM

## 2022-06-06 DIAGNOSIS — F331 Major depressive disorder, recurrent, moderate: Secondary | ICD-10-CM

## 2022-06-06 MED ORDER — SERTRALINE HCL 100 MG PO TABS
200.0000 mg | ORAL_TABLET | Freq: Every day | ORAL | 3 refills | Status: DC
  Filled 2022-06-06 – 2022-06-30 (×2): qty 60, 30d supply, fill #0
  Filled 2022-07-26: qty 60, 30d supply, fill #1

## 2022-06-06 MED ORDER — MIRTAZAPINE 45 MG PO TABS
45.0000 mg | ORAL_TABLET | Freq: Every day | ORAL | 3 refills | Status: DC
  Filled 2022-06-06 – 2022-07-06 (×2): qty 30, 30d supply, fill #0
  Filled 2022-08-02: qty 30, 30d supply, fill #1

## 2022-06-06 MED ORDER — HYDROXYZINE HCL 25 MG PO TABS
25.0000 mg | ORAL_TABLET | Freq: Three times a day (TID) | ORAL | 3 refills | Status: DC | PRN
  Filled 2022-06-06: qty 90, 30d supply, fill #0
  Filled 2022-07-10: qty 90, 30d supply, fill #1

## 2022-06-06 MED ORDER — SERTRALINE HCL 100 MG PO TABS
200.0000 mg | ORAL_TABLET | Freq: Every day | ORAL | 3 refills | Status: DC
  Filled 2022-06-06: qty 30, 15d supply, fill #0

## 2022-06-06 MED ORDER — MELATONIN 5 MG PO TABS
10.0000 mg | ORAL_TABLET | Freq: Every day | ORAL | 3 refills | Status: DC
  Filled 2022-06-06 – 2022-07-15 (×3): qty 30, 15d supply, fill #0

## 2022-06-06 NOTE — Progress Notes (Signed)
BH MD/PA/NP OP Progress Note Virtual Visit via Video Note  I connected with Craig Flores on 06/06/22 at 11:00 AM EDT by a video enabled telemedicine application and verified that I am speaking with the correct person using two identifiers.  Location: Patient: Home Provider: Clinic   I discussed the limitations of evaluation and management by telemedicine and the availability of in person appointments. The patient expressed understanding and agreed to proceed.  I provided 30 minutes of non-face-to-face time during this encounter.       06/06/2022 11:11 AM Craig Flores  MRN:  [id number]  Chief Complaint: "I was without my medications for two weeks"  HPI:  57 year old male seen today for follow up psychiatric evaluation. He has a psychiatric history of alcohol dependence, substance induced mood disorder, anxiety, depression, and SI. He is currently being managed on Zoloft 150mg  daily,  hydroxyzine 25 mg three times daily as needed, and Mirtazapine 45 mg at nightly.  Patient also takes melatonin 5 mg provides it over-the-counter.  He notes that his medications are effective in managing his psychiatric conditions.   Today he logged in virtually but his camera was turned off. During exam he was pleasant, cooperative, engaged in conversation.  He informed Clinical research associate that he went without his medications for two weeks but now that he has restarted them. Despite being back on his medications he notes that he continues to feel anxious and depressed. He notes that he worries about his health. Patient has CHF. He had a valve replaced in January and notes that his cardiac health has somewhat improved.  Today provider conducted a GAD-7 and patient scored a 15, at his last visit he scored a 9.  Provider also conducted PHQ-9 patient scored a 19, at his last visit he scored a 7. He endorses adequate appetite and sleep.  Patient endorses passive SI  but denies wanting to harm himself. Today he denies  SI/HI/VAH, mania, paranoia.  Patient informed Clinical research associate that he has gained approximately 3 pounds since his last visit.  Today Zoloft increased from 150 mg to 200 mg daily to help manage anxiety and depression. Provider discussed the risk for serotonin syndrome. He endorsed understanding and agreed. Provider informed patient that if symptoms present the medication can be reduced or discontinued. Provider also discussed starting Abilify if depression does not improve by his next visit. He endorsed understanding and agreed. He will continue all other medications as prescribed.  No other concerns at this time.    Visit Diagnosis:    ICD-10-CM   1. Generalized anxiety disorder  F41.1 mirtazapine (REMERON) 45 MG tablet    sertraline (ZOLOFT) 100 MG tablet    hydrOXYzine (ATARAX) 25 MG tablet    2. Moderate episode of recurrent major depressive disorder (HCC)  F33.1 mirtazapine (REMERON) 45 MG tablet    sertraline (ZOLOFT) 100 MG tablet      Past Psychiatric History: alcohol dependence, substance induced mood disorder, anxiety, depression, and SI.   Past Medical History:  Past Medical History:  Diagnosis Date   Acid reflux    AICD (automatic cardioverter/defibrillator) present    Anxiety    Aortic stenosis with bicuspid valve    CHF (congestive heart failure) (HCC)    Depression    Dyspnea    GERD (gastroesophageal reflux disease)    HLD (hyperlipidemia)    Ischemic cardiomyopathy    Migraines    Myocardial infarction (HCC) 08/28/2019    Past Surgical History:  Procedure Laterality Date  AORTIC VALVE REPLACEMENT N/A 02/19/2022   Procedure: AORTIC VALVE REPLACEMENT (AVR) 23mm SJM REGENT MECHANICAL AORTIC VALVE;  Surgeon: Eugenio Hoes, MD;  Location: [facility] OR;  Service: Open Heart Surgery;  Laterality: N/A;   COLONOSCOPY WITH PROPOFOL N/A 07/16/2021   Procedure: COLONOSCOPY WITH PROPOFOL;  Surgeon: Iva Boop, MD;  Location: [facility] END[facility]COPY;  Service: Gastroenterology;  Laterality: N/A;    CORONARY/GRAFT ACUTE MI REVASCULARIZATION N/A 08/28/2019   Procedure: Coronary/Graft Acute MI Revascularization;  Surgeon: Runell Gess, MD;  Location: [facility] INVASIVE CV LAB;  Service: Cardiovascular;  Laterality: N/A;   ICD IMPLANT N/A 11/28/2019   Procedure: ICD IMPLANT;  Surgeon: Duke Salvia, MD;  Location: [facility] INVASIVE CV LAB;  Service: Cardiovascular;  Laterality: N/A;   LEFT HEART CATH AND CORONARY ANGIOGRAPHY N/A 08/28/2019   Procedure: LEFT HEART CATH AND CORONARY ANGIOGRAPHY;  Surgeon: Runell Gess, MD;  Location: [facility] INVASIVE CV LAB;  Service: Cardiovascular;  Laterality: N/A;   POLYPECTOMY  07/16/2021   Procedure: POLYPECTOMY;  Surgeon: Iva Boop, MD;  Location: [facility];  Service: Gastroenterology;;   RIGHT HEART CATH AND CORONARY ANGIOGRAPHY N/A 12/12/2021   Procedure: RIGHT HEART CATH AND CORONARY ANGIOGRAPHY;  Surgeon: Kathleene Hazel, MD;  Location: [facility] INVASIVE CV LAB;  Service: Cardiovascular;  Laterality: N/A;   TEE WITHOUT CARDIOVERSION N/A 02/19/2022   Procedure: TRANSESOPHAGEAL ECHOCARDIOGRAM (TEE);  Surgeon: Eugenio Hoes, MD;  Location[facility] OR;  Service: Open Heart Surgery;  Laterality: N/A;   WISDOM TOOTH EXTRACTION      Family Psychiatric History: Notes that his mother and aunt had mental health issues. Notes he belives they had anxiety  Family History:  Family History  Problem Relation Age of Onset   Cancer Mother        peritoneal   Arrhythmia Father    Prostate cancer Father    Colon cancer Brother 65   Cancer Maternal Grandmother        type unknown   Heart attack Maternal Grandfather    Diabetes Paternal Grandmother    Heart attack Paternal Grandfather     Social History:  Social History   Socioeconomic History   Marital status: Married    Spouse name: Not on file   Number of children: 0   Years of education: Not on file   Highest education level: Not on file  Occupational History   Occupation: Clinical research associate   Occupation:  Attorney/Writer  Tobacco Use   Smoking status: Former    Packs/day: .25    Types: Cigarettes    Quit date: 02/15/2022    Years since quitting: 0.3   Smokeless tobacco: Never   Tobacco comments:    x15 yrs as of 2021  Vaping Use   Vaping Use: Never used  Substance and Sexual Activity   Alcohol use: Not Currently    Comment: a couple of drinks on the weekends   Drug use: Never   Sexual activity: Yes    Birth control/protection: None  Other Topics Concern   Not on file  Social History Narrative   2 adopted children   Social Determinants of Health   Financial Resource Strain: Not on file  Food Insecurity: No Food Insecurity (02/20/2022)   Hunger Vital Sign    Worried About Running Out of Food in the Last Year: Never true    Ran Out of Food in the Last Year: Never true  Transportation Needs: No Transportation Needs (02/20/2022)   PRAPARE - Transportation    Lack of  Transportation (Medical): No    Lack of Transportation (Non-Medical): No  Physical Activity: Not on file  Stress: Not on file  Social Connections: Not on file    Allergies: No Known Allergies  Metabolic Disorder Labs: Lab Results  Component Value Date   HGBA1C 5.8 (H) 02/17/2022   MPG 119.76 02/17/2022   MPG 119.76 08/28/2019   No results found for: "PROLACTIN" Lab Results  Component Value Date   CHOL 131 01/13/2022   TRIG 293 (H) 01/13/2022   HDL 36 (L) 01/13/2022   CHOLHDL 3.6 01/13/2022   VLDL 59 (H) 01/13/2022   LDLCALC 36 01/13/2022   LDLCALC 66 02/18/2021   Lab Results  Component Value Date   TSH 3.181 08/28/2019    Therapeutic Level Labs: No results found for: "LITHIUM" No results found for: "VALPROATE" No results found for: "CBMZ"  Current Medications: Current Outpatient Medications  Medication Sig Dispense Refill   acetaminophen (TYLENOL) 500 MG tablet Take 500 mg by mouth every 6 (six) hours as needed for headache.     aspirin 81 MG chewable tablet Chew 1 tablet (81 mg total) by  mouth daily. 90 tablet 3   atorvastatin (LIPITOR) 80 MG tablet Take 1 tablet (80 mg total) by mouth daily. 30 tablet 11   carvedilol (COREG) 6.25 MG tablet Take 1 tablet (6.25 mg total) by mouth 2 (two) times daily. 60 tablet 6   dapagliflozin propanediol (FARXIGA) 10 MG TABS tablet Take 1 tablet (10 mg total) by mouth daily before breakfast. 30 tablet 11   digoxin (LANOXIN) 0.125 MG tablet Take 1/2 tablet (0.0625 mg total) by mouth daily. 30 tablet 6   ezetimibe (ZETIA) 10 MG tablet Take 1 tablet (10 mg total) by mouth daily. 90 tablet 0   fenofibrate 160 MG tablet Take 1 tablet (160 mg total) by mouth daily. 30 tablet 3   furosemide (LASIX) 20 MG tablet Take 1 tablet (20 mg total) by mouth daily. 90 tablet 3   hydrOXYzine (ATARAX) 25 MG tablet Take 1 tablet (25 mg total) by mouth 3 (three) times daily as needed. 90 tablet 3   melatonin 5 MG TABS Take 2 tablets (10 mg total) by mouth at bedtime. 30 tablet 3   mirtazapine (REMERON) 45 MG tablet Take 1 tablet (45 mg total) by mouth at bedtime. 30 tablet 3   Multiple Vitamin (MULTIVITAMIN WITH MINERALS) TABS tablet Take 1 tablet by mouth daily.     omega-3 acid ethyl esters (LOVAZA) 1 g capsule TAKE 2 CAPSULES (2 GRAMS TOTAL) BY MOUTH TWO TIMES DAILY. 120 capsule 3   omeprazole (PRILOSEC) 20 MG capsule Take 20 mg by mouth daily before breakfast.     oxyCODONE (OXY IR/ROXICODONE) 5 MG immediate release tablet Take 1 tablet (5 mg total) by mouth every 4 (four) hours as needed for severe pain. (Patient not taking: Reported on 03/24/2022) 30 tablet 0   Probiotic Product (PROBIOTIC DAILY PO) Take 1 capsule by mouth with breakfast, with lunch, and with evening meal.     sacubitril-valsartan (ENTRESTO) 97-103 MG Take 1 tablet by mouth 2 (two) times daily. 60 tablet 11   sertraline (ZOLOFT) 100 MG tablet Take 2 tablets (200 mg total) by mouth daily. 30 tablet 3   SILDENAFIL CITRATE PO Take 45-90 mg by mouth daily as needed (ED). Take 1 - 2 tablets 30 to 45  min before sexual activity as needed     spironolactone (ALDACTONE) 25 MG tablet Take 1 tablet (25 mg total) by mouth  daily. 90 tablet 3   warfarin (COUMADIN) 5 MG tablet Take 1.5 tablets to 2 tablets by mouth once daily as directed by Anticoagulation clinic 55 tablet 3   No current facility-administered medications for this visit.     Musculoskeletal: Strength & Muscle Tone: Unable to assess, patient camera turned off Gait & Station:  Unable to assess, patient camera turned off Patient leans: N/A  Psychiatric Specialty Exam: Review of Systems  There were no vitals taken for this visit.There is no height or weight on file to calculate BMI.  General Appearance:  Unable to assess, patient camera turned off  Eye Contact:   Unable to assess, patient camera turned off  Speech:  Clear and Coherent and Normal Rate  Volume:  Normal  Mood:  Anxious and Depressed  Affect:  Appropriate and Congruent  Thought Process:  Coherent, Goal Directed and Linear  Orientation:  Full (Time, Place, and Person)  Thought Content: WDL and Logical   Suicidal Thoughts:  Yes.  without intent/plan  Homicidal Thoughts:  No  Memory:  Immediate;   Good Recent;   Good Remote;   Good  Judgement:  Good  Insight:  Good  Psychomotor Activity:  Normal  Concentration:  Concentration: Good and Attention Span: Good  Recall:  Good  Fund of Knowledge: Good  Language: Good  Akathisia:   Unable to assess, patient camera turned off  Handed:  Right  AIMS (if indicated): Not done  Assets:  Communication Skills Desire for Improvement Financial Resources/Insurance Housing Intimacy Social Support  ADL's:  Intact  Cognition: WNL  Sleep:  Good   Screenings: AIMS    Flowsheet Row [facility]  AIMS Total Score 0      AUDIT    Flowsheet Row [facility]  Alcohol Use  Disorder Identification Test Final Score (AUDIT) 19      GAD-7    Flowsheet Row Video Visit from 06/06/2022 in [facility] Video Visit from 03/26/2022 in [facility] Video Visit from 10/28/2021 in [facility] Video Visit from 07/25/2021 in [facility] Video Visit from 02/15/2021 in [facility]  Total GAD-7 Score 15 9 17 2 2       PHQ2-9    Flowsheet Row Video Visit from 06/06/2022 in [facility] Video Visit from 03/26/2022 in [facility] Video Visit from 10/28/2021 in [facility] Video Visit from 07/25/2021 in [facility] Video Visit from 02/15/2021 in [facility]  PHQ-2 Total Score 4 2 5 1 2   PHQ-9 Total Score 19 7 12 2 9       Flowsheet Row Video Visit from 06/06/2022 in [facility] Admission (Discharged) from 02/19/2022 in [facility] 4E CV SURGICAL PROGRESSIVE CARE Pre-Admission Testing 60 from 02/17/2022 in [facility] PREADMISSION TESTING  C-SSRS RISK CATEGORY Low Risk No Risk No Risk        Assessment and Plan: Patient endorses increased anxiety and depression. Today Zoloft increased from 150 mg to 200 mg daily to help manage anxiety and depression. Provider discussed the risk for serotonin syndrome. He endorsed understanding and agreed. Provider informed patient that if symptoms present the medication can be reduced or discontinued. Provider also discussed starting Abilify if depression does not improve by  his next visit. He endorsed understanding and agreed. He will continue all other medications as prescribed.   1. Generalized anxiety disorder  Continue- mirtazapine (REMERON) 45 MG tablet; Take 1 tablet (45 mg total) by mouth at bedtime.  Dispense: 30 tablet; Refill:  3 Continue- hydrOXYzine (ATARAX) 25 MG tablet; Take 1 tablet (25 mg total) by mouth 3 (three) times daily as needed.  Dispense: 90 tablet; Refill: 3 Increased- sertraline (ZOLOFT) 100 MG tablet; Take 2 tablets (200 mg total) by mouth daily.  Dispense: 60 tablet; Refill: 3  2. Moderate episode of recurrent major depressive disorder (HCC)  Continue- mirtazapine (REMERON) 45 MG tablet; Take 1 tablet (45 mg total) by mouth at bedtime.  Dispense: 30 tablet; Refill: 3 Increased- sertraline (ZOLOFT) 100 MG tablet; Take 2 tablets (200 mg total) by mouth daily.  Dispense: 60 tablet; Refill: 3  Follow up in 2.5 months Follow up with therapy  Shanna Cisco, NP 06/06/2022, 11:11 AM

## 2022-06-09 ENCOUNTER — Other Ambulatory Visit: Payer: Self-pay

## 2022-06-10 ENCOUNTER — Telehealth: Payer: Self-pay | Admitting: *Deleted

## 2022-06-10 NOTE — Telephone Encounter (Signed)
Called pt since he is overdue. He stated he does need to get back in there. Set up an appt for next week on 5/22 at 1130am.

## 2022-06-11 ENCOUNTER — Other Ambulatory Visit: Payer: Self-pay

## 2022-06-18 ENCOUNTER — Ambulatory Visit: Payer: Medicaid Other | Attending: Cardiology | Admitting: *Deleted

## 2022-06-18 DIAGNOSIS — Z5181 Encounter for therapeutic drug level monitoring: Secondary | ICD-10-CM | POA: Insufficient documentation

## 2022-06-18 DIAGNOSIS — I513 Intracardiac thrombosis, not elsewhere classified: Secondary | ICD-10-CM | POA: Insufficient documentation

## 2022-06-18 DIAGNOSIS — Z7901 Long term (current) use of anticoagulants: Secondary | ICD-10-CM | POA: Insufficient documentation

## 2022-06-18 LAB — POCT INR: INR: 6.9 — AB (ref 2.0–3.0)

## 2022-06-18 LAB — PROTIME-INR
INR: 6.1 (ref 0.9–1.2)
Prothrombin Time: 56.2 s — ABNORMAL HIGH (ref 9.1–12.0)

## 2022-06-18 NOTE — Patient Instructions (Addendum)
Description   STAT LAB TODAY INR 6.1 per [employer].   Spoke with pt and advised not to take any warfarin today, no warfarin tomorrow and take 1 tablet on Friday then resume taking warfarin 1.5 tablets daily except 2 tablets on Mondays and Fridays.  Recheck INR 1 week. Coumadin Clinic [phone] or [phone]  [place] [id number]

## 2022-06-19 NOTE — Progress Notes (Signed)
[facility] Note   PCP: Pcp, [name] Cardiology: Dr. Shirlee Latch   HPI: 57 y.o. male smoker was admitted on 08/28/19 for delayed presentation anterior STEMI. Had had >36 hr of chest pain prior to seeking medical attention. Pain progressed to more pleuritic like CP. Found to have anterior ST elevations w/ precordial Q waves on admit. Hs troponin >27,000. Urgent cardiac cath showed totally occluded mid LAD w/o collaterals and 40% mid RCA stenosis. It was suspected he had completed his infarct and residual pleuritic CP was post-MI pericarditis. No intervention was performed. 2D echo demonstrated moderately reduced LVEF, 45-50%, + apical thrombus and moderate AS. MV normal. RV systolic function mildly reduced.    He was placed on medical management for CAD and systolic HF. He was started on ASA, atorvastatin, Losartan and Coreg. Coumadin started for apical thrombus w/ heparin bridge.   Post cath, there were initial concerns for developing cardiogenic shock but he remained stable and did not require pressor/inotropic support. Co-ox remained stable. Repeat echo showed further reduction of LVEF down to 30-35% but no post MI mechanical complications. RV mildly reduced. He did require IV Lasix for pulmonary edema and volume status and dyspnea improved w/ diuresis. He was continued on GTDMT w/ losartan, spironolactone and Coreg. BP too soft for Entresto. He was treated w/ colchicine for post MI pericarditis w/ improvement in pleuritic chest pain. Given anterior MI w/ EF <35%, he was fitted w/ a LifeVest prior to discharge w/ plans to get repeat echo in 1 month.   He had repeat echo in 9/21, showing EF 25-30%.  He had [employer] ICD placed in 11/21.  Echo in 8/22 showed EF 30% with no LV thrombus, normal RV, normal IVC, moderate AS with mean gradient 28 mmHg and AVA 1.08 cm^2. Echo in 12/22 showed EF 30-35%, no LV thrombus, possibly bicuspid aortic valve with mean gradient 34 mmHg and AVA 1.02  cm^2, normal RV, IVC normal.   Echo was done in 8/23 showing LV thrombus.  INR goal on warfarin was increased to 2.5-3.5.  Echo 10/23 showed EF remains 25-30% with peri-apical akinesis and a chronic-appearing LV thrombus, severe low flow/low gradient aortic stenosis (mean gradient 27 mmHg with AVA 0.88 cm^2), bicuspid aortic valve, normal RV, normal IVC. INR increased to 3-3.5.  We have discussed barostimulation activation therapy, but Medicaid does not have good coverage for this.   Follow up 10/23, continued with NYHA II-III symptoms. Referred to structural heart team for TAVR work up, underwent L/RHC showing chronic occlusion of mLAD without target and moderate RCA stenosis, severe AS and normal right heart pressures. Decided SAVR was best option with LV thrombus.  Seen in ED 01/11/22 with acute ETOH intoxication and LOC. BP low, given IVF with improvement. Discharged home.  In 1/24, he had AVR with [employer] mechanical AoV.  Post-op, he was taken off dapagliflozin, Entresto, and spironolactone. He quit smoking a couple of weeks before AVR.   TEE 1/24 EF 20-30%, normal RV  Today he returns for AHF follow up. Overall feeling ok but dealing with anxiety and depression. Dizziness with positional changes, started about 1 month ago. Denies palpitations, CP, edema, or PND/Orthopnea. No SOB. Appetite has changed, has not been eating as much with anxiety. No fever or chills. Weight at home 184 pounds. Taking all medications. Started drinking 5-6 drinks a couple of days a week about 3 weeks ago d/t anxiety. Has not drank in several days. Started smoking again within the last  6 weeks . BP at home low 100s/70s. Fell 2-3 days ago has hematoma on L hip, did not hit his head, fell "on his butt". Has been out of lasix for about 2 weeks.   [employer] device interrogation (personally reviewed): HL Score 16  Labs (9/21): K 4.3, creatinine 0.93 Labs (11/21): LDL 67, HDL 37, K 4.4, creatinine 1.61 Labs  (1/22): digoxin 0.5, K 4, creatinine 1.1.  Labs (5/22): digoxin 0.9, K 4.4, creatinine 1.06 Labs (8/22): K 4.3, creatinine 0.91, TGs 343, LDL 65, HDL 31, digoxin 1.0 Labs (09/60): K 4.1, creatinine 1.14, pro-BNP 120 Labs (1/23): hgb 14.7, LDL 66, TGs 99 Labs (5/23): digoxin 0.3, K 4.3, creatinine 1.17 Labs (8/23): digoxin 0.2, K 4.5, creatinine 1.17 Labs (12/23): K 5.3, creatinine 1.07, hgb 15.6, LDL 36 Labs (1/24): K 3.9, creatinine 1.08 Labs (3/24): K 4.2, SCr 1.18  PMH: 1. GERD 2. Depression  3. CAD: Anterior STEMI in 8/21 with totally occluded LAD/no collaterals, 40% mid RCA.  Due to late presentation, no PCI was done.  - Had post-infarct pericarditis.  4. Chronic systolic CHF: Ischemic cardiomyopathy. [employer] ICD.  - Echo (9/21) with EF 25-30%, normal RV, moderate AS with mean gradient 22 mmHg.  - Echo (8/22): EF 30% with no LV thrombus, normal RV, normal IVC, moderate AS with mean gradient 28 mmHg and AVA 1.08 cm^2.  - Echo (12/22): EF 30-35%, no LV thrombus, possibly bicuspid aortic valve with mean gradient 34 mmHg and AVA 1.02 cm^2, normal RV, IVC normal.  - Echo (10/23): EF remains 25-30% with peri-apical akinesis and a chronic-appearing LV thrombus, severe low flow/low gradient aortic stenosis (mean gradient 27 mmHg with AVA 0.88 cm^2), bicuspid aortic valve, normal RV, normal IVC. - R/LHC (11/23): mLAD 100% stenosed, pRCA 50% stenosed; RA mean 3, PA 25/14 mean 18, PCWP 13, CO/CI (Fick) 5.03/2.54 - TEE 1/24 EF 20-30%, normal RV 5. LV thrombus: Apical thrombus noted on echo at time of MI in 8/21. Resolved by 9/21 echo.  - Echo 10/23 with chronic-appearing, immobile LV thrombus.  6. Aortic stenosis: Bicuspid valve. Moderate on 12/22 echo.  - 10/23 echo showed low flow/low gradient severe AS.   7. Prior smoker.   Current Outpatient Medications  Medication Sig Dispense Refill   acetaminophen (TYLENOL) 500 MG tablet Take 500 mg by mouth every 6 (six) hours as needed for  headache.     aspirin 81 MG chewable tablet Chew 1 tablet (81 mg total) by mouth daily. 90 tablet 3   atorvastatin (LIPITOR) 80 MG tablet Take 1 tablet (80 mg total) by mouth daily. 30 tablet 11   carvedilol (COREG) 6.25 MG tablet Take 1 tablet (6.25 mg total) by mouth 2 (two) times daily. 60 tablet 6   dapagliflozin propanediol (FARXIGA) 10 MG TABS tablet Take 1 tablet (10 mg total) by mouth daily before breakfast. 30 tablet 11   digoxin (LANOXIN) 0.125 MG tablet Take 1/2 tablet (0.0625 mg total) by mouth daily. 30 tablet 6   ezetimibe (ZETIA) 10 MG tablet Take 1 tablet (10 mg total) by mouth daily. 90 tablet 0   fenofibrate 160 MG tablet Take 1 tablet (160 mg total) by mouth daily. 30 tablet 3   furosemide (LASIX) 20 MG tablet Take 1 tablet (20 mg total) by mouth daily as needed for fluid or edema. 30 tablet 11   hydrOXYzine (ATARAX) 25 MG tablet Take 1 tablet (25 mg total) by mouth 3 (three) times daily as needed. 90 tablet 3  melatonin 5 MG TABS Take 2 tablets (10 mg total) by mouth at bedtime. 30 tablet 3   mirtazapine (REMERON) 45 MG tablet Take 1 tablet (45 mg total) by mouth at bedtime. 30 tablet 3   Multiple Vitamin (MULTIVITAMIN WITH MINERALS) TABS tablet Take 1 tablet by mouth daily.     omega-3 acid ethyl esters (LOVAZA) 1 g capsule TAKE 2 CAPSULES (2 GRAMS TOTAL) BY MOUTH TWO TIMES DAILY. 120 capsule 3   omeprazole (PRILOSEC) 20 MG capsule Take 20 mg by mouth daily before breakfast.     oxyCODONE (OXY IR/ROXICODONE) 5 MG immediate release tablet Take 1 tablet (5 mg total) by mouth every 4 (four) hours as needed for severe pain. 30 tablet 0   Probiotic Product (PROBIOTIC DAILY PO) Take 1 capsule by mouth with breakfast, with lunch, and with evening meal.     sacubitril-valsartan (ENTRESTO) 97-103 MG Take 1 tablet by mouth 2 (two) times daily. 60 tablet 11   sertraline (ZOLOFT) 100 MG tablet Take 2 tablets (200 mg total) by mouth daily. 60 tablet 3   SILDENAFIL CITRATE PO Take 45-90 mg  by mouth daily as needed (ED). Take 1 - 2 tablets 30 to 45 min before sexual activity as needed     spironolactone (ALDACTONE) 25 MG tablet Take 1 tablet (25 mg total) by mouth daily. 90 tablet 3   varenicline (CHANTIX) 0.5 MG tablet Take 1 tablet (0.5 mg total) by mouth daily for 3 days, THEN 1 tablet (0.5 mg total) 2 (two) times daily for 3 days, THEN 2 tablets (1 mg total) 2 (two) times daily. 106 tablet 5   warfarin (COUMADIN) 5 MG tablet Take 1.5 tablets to 2 tablets by mouth once daily as directed by Anticoagulation clinic (Patient not taking: Reported on 06/20/2022) 55 tablet 3   No current facility-administered medications for this encounter.   No Known Allergies  Social History   Socioeconomic History   Marital status: Married    Spouse name: Not on file   Number of children: 0   Years of education: Not on file   Highest education level: Not on file  Occupational History   Occupation: Clinical research associate   Occupation: Attorney/Writer  Tobacco Use   Smoking status: Former    Packs/day: .25    Types: Cigarettes    Quit date: 02/15/2022    Years since quitting: 0.3   Smokeless tobacco: Never   Tobacco comments:    x15 yrs as of 2021  Vaping Use   Vaping Use: Never used  Substance and Sexual Activity   Alcohol use: Not Currently    Comment: a couple of drinks on the weekends   Drug use: Never   Sexual activity: Yes    Birth control/protection: None  Other Topics Concern   Not on file  Social History Narrative   2 adopted children   Social Determinants of Health   Financial Resource Strain: Not on file  Food Insecurity: No Food Insecurity (02/20/2022)   Hunger Vital Sign    Worried About Running Out of Food in the Last Year: Never true    Ran Out of Food in the Last Year: Never true  Transportation Needs: No Transportation Needs (02/20/2022)   PRAPARE - Administrator, Civil Service (Medical): No    Lack of Transportation (Non-Medical): No  Physical Activity: Not on  file  Stress: Not on file  Social Connections: Not on file  Intimate Partner Violence: Not At Risk (02/20/2022)  Humiliation, Afraid, Rape, and Kick questionnaire    Fear of Current or Ex-Partner: No    Emotionally Abused: No    Physically Abused: No    Sexually Abused: No   Family History  Problem Relation Age of Onset   Cancer Mother        peritoneal   Arrhythmia Father    Prostate cancer Father    Colon cancer Brother 69   Cancer Maternal Grandmother        type unknown   Heart attack Maternal Grandfather    Diabetes Paternal Grandmother    Heart attack Paternal Grandfather    BP 104/70   Pulse 89   Wt 87.8 kg (193 lb 9.6 oz)   SpO2 97%   BMI 28.59 kg/m   Wt Readings from Last 3 Encounters:  06/20/22 87.8 kg (193 lb 9.6 oz)  04/15/22 87.7 kg (193 lb 6.4 oz)  03/24/22 87.9 kg (193 lb 12.8 oz)   PHYSICAL EXAM: General:  well appearing.  No respiratory difficulty. Walked into clinic.  HEENT: normal Neck: supple. JVD flat. Carotids 2+ bilat; no bruits. No lymphadenopathy or thyromegaly appreciated. Cor: PMI nondisplaced. Regular rate & rhythm. No rubs, gallops or murmurs. Lungs: clear Abdomen: soft, nontender, nondistended. No hepatosplenomegaly. No bruits or masses. Good bowel sounds. Extremities: no cyanosis, clubbing, rash, edema  Neuro: alert & oriented x 3, cranial nerves grossly intact. moves all 4 extremities w/o difficulty. Affect pleasant.   EKG NSR 85 bpm. QTc 406  ASSESSMENT & PLAN: 1. CAD: Admission 8/21 for acute anterior MI, delayed presentation with no ischemic pain, had pleuritic-type chest pain (post-MI pericarditis). LHC with totally occluded mid LAD w/o collaterals.  Suspected completed MI, no intervention. No ischemic chest pain.  - He is on ASA 81 daily, continue.  - Continue atorvastatin 80 mg daily. Good lipids 12/23.  2. Chronic systolic CHF: Ischemic Cardiomyopathy.  Reduced EF post anterior MI.  Echo in 9/21 with EF 25-30%, peri-apical  akinesis, moderate AS. Cath w/ occluded mLAD but no intervention as he had completed his infarct. He has [employer] ICD.  Echo in 8/22 with EF 30%.  Echo in 12/22 with EF 30-35%, normal RV, moderate AS. Echo 10/23 showed EF remains 25-30% with peri-apical akinesis and a chronic-appearing LV thrombus, severe low flow/low gradient aortic stenosis (mean gradient 27 mmHg with AVA 0.88 cm^2), bicuspid aortic valve, normal RV, normal IVC.  Now s/p mechanical AVR.  TEE 1/24 EF 20-30%, normal RV.  - NYHA class II-III symptoms, he is not volume overloaded by exam and [employer] stable. He has not been on Lasix.  - Restart Lasix 20 mg daily PRN  - Continue Entresto 97/103 bid. - Continue spironolactone 25 mg daily.  - Continue Coreg 6.25 mg bid.  - Continue dapagliflozin 10 mg daily.  - Continue digoxin 0.0625, already took dig this morning, check at next appt.  - High carotid bifurcation so cannot get Batwire, unable to get commercial baroreceptor activation therapy as Medicaid will not cover.  - orthostatics today checked, (-) Orthostatic VS for the past 72 hrs (Last 3 readings):  Orthostatic BP Orthostatic Pulse  06/20/22 1054 116/84 Standing  93  06/20/22 1053 114/80 Sitting 85  06/20/22 1051 114/70 Laying 81   3. Post MI Pericarditis: Resolved.  4. LV thrombus: Large LV thrombus noted on echo in 8/21, resolved on 9/21 echo.  Thrombus had recurred on 8/23 echo and warfarin was increased to INR goal 2.5-3.5 and ASA 81 was added.  Echo 10/23, there was still an LV thrombus, though it was smaller than in 8/23. Not mobile.  - Currently holding warfarin, 6.1 5/22. Managed by coumadin clinic. INR goal 2.5-3.5 and continue ASA 81 daily. Will check INR today and send to coumadin clinic.  5. Tobacco use:  - started smoking again, would like to try chantix again. Previously worked for him. - QTc stable on EKG (checked with psych meds)  6. HLD: Good lipids in 12/23.  7. Aortic stenosis: Bicuspid aortic  valve with moderate AS on 12/22 echo. Echo in 10/23 showed severe low flow/low gradient aortic stenosis with mean gradient 27 mmHg and AVA 0.88 cm^2.  He had SAVR 02/19/22/ with Dr. Leafy Ro.  - Continue ASA 81 + warfarin.  - Will re-refer for cardiac rehab. Didn't complete last time with insurance issues.  8. Alcohol use - started drinking again with anxiety/depression - encouraged cessation  Follow up with Dr. Shirlee Latch in 3-4 months  Alen Bleacher, NP  06/20/2022

## 2022-06-20 ENCOUNTER — Other Ambulatory Visit: Payer: Self-pay

## 2022-06-20 ENCOUNTER — Ambulatory Visit (INDEPENDENT_AMBULATORY_CARE_PROVIDER_SITE_OTHER): Payer: Self-pay | Admitting: *Deleted

## 2022-06-20 ENCOUNTER — Ambulatory Visit (HOSPITAL_COMMUNITY)
Admission: RE | Admit: 2022-06-20 | Discharge: 2022-06-20 | Disposition: A | Discharge status: Home / Self Care | Payer: Medicaid Other | Source: Ambulatory Visit | Attending: Family Medicine | Admitting: Family Medicine

## 2022-06-20 ENCOUNTER — Encounter (HOSPITAL_COMMUNITY): Payer: Self-pay

## 2022-06-20 ENCOUNTER — Ambulatory Visit (INDEPENDENT_AMBULATORY_CARE_PROVIDER_SITE_OTHER): Payer: Medicaid Other

## 2022-06-20 VITALS — BP 104/70 | HR 89 | Wt 193.6 lb

## 2022-06-20 DIAGNOSIS — F1010 Alcohol abuse, uncomplicated: Secondary | ICD-10-CM

## 2022-06-20 DIAGNOSIS — F1090 Alcohol use, unspecified, uncomplicated: Secondary | ICD-10-CM | POA: Diagnosis not present

## 2022-06-20 DIAGNOSIS — Z7901 Long term (current) use of anticoagulants: Secondary | ICD-10-CM

## 2022-06-20 DIAGNOSIS — Z86718 Personal history of other venous thrombosis and embolism: Secondary | ICD-10-CM | POA: Insufficient documentation

## 2022-06-20 DIAGNOSIS — F17210 Nicotine dependence, cigarettes, uncomplicated: Secondary | ICD-10-CM | POA: Insufficient documentation

## 2022-06-20 DIAGNOSIS — E785 Hyperlipidemia, unspecified: Secondary | ICD-10-CM | POA: Insufficient documentation

## 2022-06-20 DIAGNOSIS — I513 Intracardiac thrombosis, not elsewhere classified: Secondary | ICD-10-CM | POA: Diagnosis not present

## 2022-06-20 DIAGNOSIS — Z79899 Other long term (current) drug therapy: Secondary | ICD-10-CM | POA: Diagnosis not present

## 2022-06-20 DIAGNOSIS — F419 Anxiety disorder, unspecified: Secondary | ICD-10-CM | POA: Insufficient documentation

## 2022-06-20 DIAGNOSIS — Z7982 Long term (current) use of aspirin: Secondary | ICD-10-CM | POA: Insufficient documentation

## 2022-06-20 DIAGNOSIS — I255 Ischemic cardiomyopathy: Secondary | ICD-10-CM

## 2022-06-20 DIAGNOSIS — Z5181 Encounter for therapeutic drug level monitoring: Secondary | ICD-10-CM

## 2022-06-20 DIAGNOSIS — I5022 Chronic systolic (congestive) heart failure: Secondary | ICD-10-CM

## 2022-06-20 DIAGNOSIS — Z716 Tobacco abuse counseling: Secondary | ICD-10-CM | POA: Insufficient documentation

## 2022-06-20 DIAGNOSIS — Z95810 Presence of automatic (implantable) cardiac defibrillator: Secondary | ICD-10-CM | POA: Diagnosis not present

## 2022-06-20 DIAGNOSIS — I350 Nonrheumatic aortic (valve) stenosis: Secondary | ICD-10-CM | POA: Diagnosis not present

## 2022-06-20 DIAGNOSIS — I2510 Atherosclerotic heart disease of native coronary artery without angina pectoris: Secondary | ICD-10-CM | POA: Insufficient documentation

## 2022-06-20 DIAGNOSIS — I252 Old myocardial infarction: Secondary | ICD-10-CM | POA: Diagnosis not present

## 2022-06-20 DIAGNOSIS — R42 Dizziness and giddiness: Secondary | ICD-10-CM | POA: Insufficient documentation

## 2022-06-20 DIAGNOSIS — F32A Depression, unspecified: Secondary | ICD-10-CM | POA: Diagnosis not present

## 2022-06-20 DIAGNOSIS — I241 Dressler's syndrome: Secondary | ICD-10-CM

## 2022-06-20 DIAGNOSIS — Z720 Tobacco use: Secondary | ICD-10-CM

## 2022-06-20 LAB — CBC
HCT: 36.4 % — ABNORMAL LOW (ref 39.0–52.0)
Hemoglobin: 11.9 g/dL — ABNORMAL LOW (ref 13.0–17.0)
MCH: 26.2 pg (ref 26.0–34.0)
MCHC: 32.7 g/dL (ref 30.0–36.0)
MCV: 80.2 fL (ref 80.0–100.0)
Platelets: 278 10*3/uL (ref 150–400)
RBC: 4.54 MIL/uL (ref 4.22–5.81)
RDW: 21.3 % — ABNORMAL HIGH (ref 11.5–15.5)
WBC: 7.8 10*3/uL (ref 4.0–10.5)
nRBC: 0 % (ref 0.0–0.2)

## 2022-06-20 LAB — CUP PACEART REMOTE DEVICE CHECK
Battery Remaining Longevity: 168 mo
Battery Remaining Percentage: 100 %
Brady Statistic RV Percent Paced: 0 %
Date Time Interrogation Session: 20240524100600
HighPow Impedance: 70 Ohm
Implantable Lead Connection Status: 753985
Implantable Lead Implant Date: 20211101
Implantable Lead Location: 753860
Implantable Lead Model: 138
Implantable Lead Serial Number: 303500
Implantable Pulse Generator Implant Date: 20211101
Lead Channel Impedance Value: 432 Ohm
Lead Channel Setting Pacing Amplitude: 2.5 V
Lead Channel Setting Pacing Pulse Width: 0.4 ms
Lead Channel Setting Sensing Sensitivity: 0.5 mV
Pulse Gen Serial Number: 209902

## 2022-06-20 LAB — BASIC METABOLIC PANEL
Anion gap: 10 (ref 5–15)
BUN: 23 mg/dL — ABNORMAL HIGH (ref 6–20)
CO2: 22 mmol/L (ref 22–32)
Calcium: 9.5 mg/dL (ref 8.9–10.3)
Chloride: 101 mmol/L (ref 98–111)
Creatinine, Ser: 1.04 mg/dL (ref 0.61–1.24)
GFR, Estimated: >60 mL/min (ref >60)
Glucose, Bld: 100 mg/dL — ABNORMAL HIGH (ref 70–99)
Potassium: 4.6 mmol/L (ref 3.5–5.1)
Sodium: 133 mmol/L — ABNORMAL LOW (ref 135–145)

## 2022-06-20 LAB — PROTIME-INR
INR: 1.9 — ABNORMAL HIGH (ref 0.8–1.2)
Prothrombin Time: 22.4 seconds — ABNORMAL HIGH (ref 11.4–15.2)

## 2022-06-20 MED ORDER — VARENICLINE TARTRATE 0.5 MG PO TABS
ORAL_TABLET | ORAL | 5 refills | Status: DC
  Filled 2022-06-20: qty 106, 30d supply, fill #0
  Filled 2022-09-06 – 2022-09-08 (×2): qty 106, 27d supply, fill #1

## 2022-06-20 MED ORDER — FUROSEMIDE 20 MG PO TABS
20.0000 mg | ORAL_TABLET | Freq: Every day | ORAL | 11 refills | Status: DC | PRN
  Filled 2022-06-20: qty 30, 30d supply, fill #0
  Filled 2022-08-10: qty 30, 30d supply, fill #1

## 2022-06-20 NOTE — Patient Instructions (Addendum)
EKG done today.  Labs done today. We will contact you only if your labs are abnormal.  START Lasix 20mg  (1 tablet) by mouth daily as needed for swelling or a weight gain of 3 pounds or more in 24 hours or 5 pounds in 1 week.   START Chantix Take 1 tablet (0.5 mg total) by mouth daily for 3 days, THEN 1 tablet (0.5 mg total) 2 (two) times daily for 3 days, THEN 2 tablets (1 mg total) 2 (two) times dail  No other medication changes were made. Please continue all current medications as prescribed.  You have been referred to Cardiac Rehab. They will contact you to schedule an appointment.  Your physician recommends that you schedule a follow-up appointment in: 3-4 months with Dr. Shirlee Latch  If you have any questions or concerns before your next appointment please send [place] a message through [facility] or call our office at [phone].    TO LEAVE A MESSAGE FOR THE NURSE SELECT OPTION 2, PLEASE LEAVE A MESSAGE INCLUDING: YOUR NAME DATE OF BIRTH CALL BACK NUMBER REASON FOR CALL**this is important as we prioritize the call backs  YOU WILL RECEIVE A CALL BACK THE SAME DAY AS LONG AS YOU CALL BEFORE 4:00 PM   Do the following things EVERYDAY: Weigh yourself in the morning before breakfast. Write it down and keep it in a log. Take your medicines as prescribed Eat low salt foods--Limit salt (sodium) to 2000 mg per day.  Stay as active as you can everyday Limit all fluids for the day to less than 2 liters   At the [facility], you and your health needs are our priority. As part of our continuing mission to provide you with exceptional heart care, we have created designated Provider Care Teams. These Care Teams include your primary Cardiologist (physician) and Advanced Practice Providers (APPs- Physician Assistants and Nurse Practitioners) who all work together to provide you with the care you need, when you need it.   You may see any of the following providers on your designated  Care Team at your next follow up: Dr Arvilla Meres Dr Marca Ancona Dr. Marcos Eke, NP Robbie Lis, [place] [facility] [place], [place] Brynda Peon, NP Karle Plumber, PharmD   Please be sure to bring in all your medications bottles to every appointment.    Thank you for choosing [facility]

## 2022-06-20 NOTE — Patient Instructions (Signed)
Description   INR results received from [facility].   Spoke with pt and advised to take 1.5 tablets of warfarin today then continue taking warfarin 1.5 tablets daily except 2 tablets on Mondays and Fridays.  Recheck INR 1 week. Coumadin Clinic [phone] or [phone]  [place] [id number]

## 2022-06-23 ENCOUNTER — Other Ambulatory Visit: Payer: Self-pay | Admitting: Physician Assistant

## 2022-06-23 ENCOUNTER — Other Ambulatory Visit (HOSPITAL_COMMUNITY): Payer: Self-pay | Admitting: Cardiology

## 2022-06-24 ENCOUNTER — Encounter (HOSPITAL_COMMUNITY): Payer: Self-pay

## 2022-06-24 ENCOUNTER — Other Ambulatory Visit: Payer: Self-pay

## 2022-06-24 ENCOUNTER — Other Ambulatory Visit (HOSPITAL_COMMUNITY): Payer: Self-pay

## 2022-06-24 ENCOUNTER — Other Ambulatory Visit: Payer: Self-pay | Admitting: Physician Assistant

## 2022-06-24 MED ORDER — EZETIMIBE 10 MG PO TABS
10.0000 mg | ORAL_TABLET | Freq: Every day | ORAL | 0 refills | Status: DC
  Filled 2022-06-24 – 2022-06-25 (×2): qty 90, 90d supply, fill #0

## 2022-06-25 ENCOUNTER — Other Ambulatory Visit: Payer: Self-pay

## 2022-06-25 ENCOUNTER — Other Ambulatory Visit (HOSPITAL_COMMUNITY): Payer: Self-pay

## 2022-06-25 MED ORDER — FENOFIBRATE 160 MG PO TABS
160.0000 mg | ORAL_TABLET | Freq: Every day | ORAL | 3 refills | Status: DC
  Filled 2022-06-25: qty 30, 30d supply, fill #0
  Filled 2022-07-20: qty 30, 30d supply, fill #1
  Filled 2022-08-25: qty 30, 30d supply, fill #2
  Filled 2022-09-20: qty 30, 30d supply, fill #3

## 2022-06-26 ENCOUNTER — Ambulatory Visit: Payer: Medicaid Other | Attending: Cardiology

## 2022-06-26 ENCOUNTER — Other Ambulatory Visit: Payer: Self-pay

## 2022-06-26 DIAGNOSIS — Z7901 Long term (current) use of anticoagulants: Secondary | ICD-10-CM | POA: Diagnosis not present

## 2022-06-26 DIAGNOSIS — I513 Intracardiac thrombosis, not elsewhere classified: Secondary | ICD-10-CM | POA: Diagnosis not present

## 2022-06-26 DIAGNOSIS — Z5181 Encounter for therapeutic drug level monitoring: Secondary | ICD-10-CM | POA: Diagnosis not present

## 2022-06-26 LAB — POCT INR: INR: 2.7 (ref 2.0–3.0)

## 2022-06-26 NOTE — Patient Instructions (Signed)
TAKE 2 TABLETS TODAY ONLY THEN  continue taking warfarin 1.5 tablets daily except 2 tablets on Mondays and Fridays.  Recheck INR 2 weeks. Coumadin Clinic [phone] or [phone]  [place] [id number]

## 2022-06-30 ENCOUNTER — Other Ambulatory Visit: Payer: Self-pay

## 2022-07-01 ENCOUNTER — Encounter (HOSPITAL_COMMUNITY): Payer: Self-pay

## 2022-07-01 ENCOUNTER — Telehealth (HOSPITAL_COMMUNITY): Payer: Self-pay

## 2022-07-01 ENCOUNTER — Other Ambulatory Visit: Payer: Self-pay

## 2022-07-01 NOTE — Telephone Encounter (Signed)
Attempted to call patient in regards to Cardiac Rehab - LM on VM Mailed letter 

## 2022-07-06 MED FILL — Warfarin Sodium Tab 5 MG: ORAL | 27 days supply | Qty: 55 | Fill #3 | Status: CN

## 2022-07-07 ENCOUNTER — Other Ambulatory Visit: Payer: Self-pay

## 2022-07-07 MED FILL — Warfarin Sodium Tab 5 MG: ORAL | 27 days supply | Qty: 55 | Fill #3 | Status: AC

## 2022-07-08 NOTE — Progress Notes (Signed)
Remote ICD transmission.   

## 2022-07-10 ENCOUNTER — Ambulatory Visit: Payer: Medicaid Other | Attending: Cardiology | Admitting: *Deleted

## 2022-07-10 DIAGNOSIS — I513 Intracardiac thrombosis, not elsewhere classified: Secondary | ICD-10-CM | POA: Diagnosis not present

## 2022-07-10 DIAGNOSIS — Z5181 Encounter for therapeutic drug level monitoring: Secondary | ICD-10-CM

## 2022-07-10 LAB — POCT INR: POC INR: 3

## 2022-07-10 NOTE — Patient Instructions (Addendum)
Description   Continue taking warfarin 1.5 tablets daily except 2 tablets on Mondays and Fridays.  Recheck INR 4 weeks. Coumadin Clinic [phone] or [phone]  [place] [id number]

## 2022-07-15 ENCOUNTER — Other Ambulatory Visit: Payer: Self-pay

## 2022-07-15 ENCOUNTER — Other Ambulatory Visit: Payer: Self-pay | Admitting: Cardiology

## 2022-07-15 MED ORDER — OMEGA-3-ACID ETHYL ESTERS 1 G PO CAPS
2.0000 g | ORAL_CAPSULE | Freq: Two times a day (BID) | ORAL | 3 refills | Status: DC
  Filled 2022-07-15: qty 120, 30d supply, fill #0
  Filled 2022-08-10: qty 120, 30d supply, fill #1
  Filled 2022-09-14: qty 120, 30d supply, fill #2
  Filled 2022-10-11: qty 120, 30d supply, fill #3

## 2022-07-21 ENCOUNTER — Other Ambulatory Visit: Payer: Self-pay

## 2022-07-24 ENCOUNTER — Other Ambulatory Visit: Payer: Self-pay

## 2022-07-25 ENCOUNTER — Other Ambulatory Visit: Payer: Self-pay

## 2022-07-28 ENCOUNTER — Other Ambulatory Visit: Payer: Self-pay

## 2022-08-04 ENCOUNTER — Other Ambulatory Visit: Payer: Self-pay

## 2022-08-07 ENCOUNTER — Ambulatory Visit: Payer: Medicaid Other

## 2022-08-10 ENCOUNTER — Other Ambulatory Visit (HOSPITAL_COMMUNITY): Payer: Self-pay | Admitting: Cardiology

## 2022-08-10 DIAGNOSIS — I513 Intracardiac thrombosis, not elsewhere classified: Secondary | ICD-10-CM

## 2022-08-11 ENCOUNTER — Other Ambulatory Visit: Payer: Self-pay

## 2022-08-11 MED ORDER — WARFARIN SODIUM 5 MG PO TABS
ORAL_TABLET | ORAL | 3 refills | Status: DC
  Filled 2022-08-11: qty 55, 18d supply, fill #0
  Filled 2022-09-06: qty 55, 18d supply, fill #1
  Filled 2022-10-11: qty 55, 18d supply, fill #2
  Filled 2022-11-16: qty 55, 18d supply, fill #3

## 2022-08-14 ENCOUNTER — Ambulatory Visit: Payer: Medicaid Other | Attending: Internal Medicine

## 2022-08-14 DIAGNOSIS — Z5181 Encounter for therapeutic drug level monitoring: Secondary | ICD-10-CM

## 2022-08-14 DIAGNOSIS — I513 Intracardiac thrombosis, not elsewhere classified: Secondary | ICD-10-CM | POA: Diagnosis not present

## 2022-08-14 LAB — POCT INR: INR: 3.2 — AB (ref 2.0–3.0)

## 2022-08-14 NOTE — Patient Instructions (Signed)
Description   Continue taking warfarin 1.5 tablets daily except 2 tablets on Mondays and Fridays.  Recheck INR 5 weeks.  Coumadin Clinic [phone] or [phone]  [place] [id number]

## 2022-08-15 ENCOUNTER — Telehealth (INDEPENDENT_AMBULATORY_CARE_PROVIDER_SITE_OTHER): Payer: Medicaid Other | Admitting: Psychiatry

## 2022-08-15 ENCOUNTER — Other Ambulatory Visit: Payer: Self-pay

## 2022-08-15 ENCOUNTER — Encounter (HOSPITAL_COMMUNITY): Payer: Self-pay | Admitting: Psychiatry

## 2022-08-15 DIAGNOSIS — F331 Major depressive disorder, recurrent, moderate: Secondary | ICD-10-CM

## 2022-08-15 DIAGNOSIS — F411 Generalized anxiety disorder: Secondary | ICD-10-CM | POA: Diagnosis not present

## 2022-08-15 MED ORDER — SERTRALINE HCL 100 MG PO TABS
200.0000 mg | ORAL_TABLET | Freq: Every day | ORAL | 3 refills | Status: DC
  Filled 2022-08-15 – 2022-08-31 (×2): qty 60, 30d supply, fill #0
  Filled 2022-09-27: qty 60, 30d supply, fill #1
  Filled 2022-10-25: qty 60, 30d supply, fill #2

## 2022-08-15 MED ORDER — HYDROXYZINE HCL 25 MG PO TABS
25.0000 mg | ORAL_TABLET | Freq: Three times a day (TID) | ORAL | 3 refills | Status: DC | PRN
  Filled 2022-08-15 – 2022-09-06 (×3): qty 90, 30d supply, fill #0
  Filled 2022-11-08: qty 90, 30d supply, fill #1

## 2022-08-15 MED ORDER — MIRTAZAPINE 45 MG PO TABS
45.0000 mg | ORAL_TABLET | Freq: Every day | ORAL | 3 refills | Status: DC
  Filled 2022-08-15 – 2022-08-31 (×2): qty 30, 30d supply, fill #0
  Filled 2022-09-27: qty 30, 30d supply, fill #1
  Filled 2022-11-02: qty 30, 30d supply, fill #2

## 2022-08-15 NOTE — Progress Notes (Signed)
BH MD/PA/NP OP Progress Note Virtual Visit via Video Note  I connected with Craig Flores on 08/15/22 at 11:00 AM EDT by a video enabled telemedicine application and verified that I am speaking with the correct person using two identifiers.  Location: Patient: Home Provider: Clinic   I discussed the limitations of evaluation and management by telemedicine and the availability of in person appointments. The patient expressed understanding and agreed to proceed.  I provided 30 minutes of non-face-to-face time during this encounter.       08/15/2022 11:09 AM Craig Flores  MRN:  [id number]  Chief Complaint: "I feel an improvement with the increase in Zoloft"  HPI:  57 year old male seen today for follow up psychiatric evaluation. He has a psychiatric history of alcohol dependence, substance induced mood disorder, anxiety, depression, and SI. He is currently being managed on Zoloft 200mg  daily,  hydroxyzine 25 mg three times daily as needed, and Mirtazapine 45 mg at nightly.  Patient also takes melatonin 5 mg provides it over-the-counter.  He notes that his medications are effective in managing his psychiatric conditions.   Today he well pleasant, cooperative, engaged in conversation.  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 Since his last visit he notes that his anxiety and depression has improved. He notes that at time he is tired and suffers from fatigue but notes that this is likely due to CHF. Patient had a valve replaced in January. Despite his fatigue he notes that his anxiety and depression are well managed. Today provider conducted a GAD-7 and patient scored a 9, at his last visit he scored a 15.  Provider also conducted PHQ-9 patient scored a 7, at his last visit he scored a 19. He endorses adequate appetite and sleep.  Today he denies SI/HI/VAH, mania, paranoia.   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  Today no medication changes made. Patient agreeable to continue medications as prescribed.   No other concerns at this time.    Visit Diagnosis:    ICD-10-CM   1. Generalized anxiety disorder  F41.1 mirtazapine (REMERON) 45 MG tablet    hydrOXYzine (ATARAX) 25 MG tablet    sertraline (ZOLOFT) 100 MG tablet    2. Moderate episode of recurrent major depressive disorder (HCC)  F33.1 mirtazapine (REMERON) 45 MG tablet    sertraline (ZOLOFT) 100 MG tablet       Past Psychiatric History: alcohol dependence, substance induced mood disorder, anxiety, depression, and SI.   Past Medical History:  Past Medical History:  Diagnosis Date   Acid reflux    AICD (automatic cardioverter/defibrillator) present    Anxiety    Aortic stenosis with bicuspid valve    CHF (congestive heart failure) (HCC)    Depression    Dyspnea    GERD (gastroesophageal reflux disease)    HLD (hyperlipidemia)    Ischemic cardiomyopathy    Migraines    Myocardial infarction (HCC) 08/28/2019    Past Surgical History:  Procedure Laterality Date   AORTIC VALVE REPLACEMENT N/A 02/19/2022   Procedure: AORTIC VALVE REPLACEMENT (AVR) 23mm SJM REGENT MECHANICAL AORTIC VALVE;  Surgeon: Eugenio Hoes, MD;  Location: [facility] OR;  Service: Open Heart Surgery;  Laterality: N/A;   COLONOSCOPY WITH PROPOFOL N/A 07/16/2021   Procedure: COLONOSCOPY WITH PROPOFOL;  Surgeon: Iva Boop, MD;  Location: [facility];  Service: Gastroenterology;  Laterality:  N/A;   CORONARY/GRAFT ACUTE MI REVASCULARIZATION N/A 08/28/2019   Procedure: Coronary/Graft Acute MI Revascularization;  Surgeon: Runell Gess, MD;  Location: [facility] INVASIVE CV LAB;  Service: Cardiovascular;  Laterality: N/A;   ICD IMPLANT N/A 11/28/2019   Procedure: ICD IMPLANT;  Surgeon: Duke Salvia, MD;  Location: [facility] INVASIVE CV LAB;  Service: Cardiovascular;  Laterality: N/A;   LEFT HEART CATH AND CORONARY ANGIOGRAPHY N/A  08/28/2019   Procedure: LEFT HEART CATH AND CORONARY ANGIOGRAPHY;  Surgeon: Runell Gess, MD;  Location: [facility] INVASIVE CV LAB;  Service: Cardiovascular;  Laterality: N/A;   POLYPECTOMY  07/16/2021   Procedure: POLYPECTOMY;  Surgeon: Iva Boop, MD;  Location: [facility];  Service: Gastroenterology;;   RIGHT HEART CATH AND CORONARY ANGIOGRAPHY N/A 12/12/2021   Procedure: RIGHT HEART CATH AND CORONARY ANGIOGRAPHY;  Surgeon: Kathleene Hazel, MD;  Location: [facility] INVASIVE CV LAB;  Service: Cardiovascular;  Laterality: N/A;   TEE WITHOUT CARDIOVERSION N/A 02/19/2022   Procedure: TRANSESOPHAGEAL ECHOCARDIOGRAM (TEE);  Surgeon: Eugenio Hoes, MD;  Location: [facility] OR;  Service: Open Heart Surgery;  Laterality: N/A;   WISDOM TOOTH EXTRACTION      Family Psychiatric History: Notes that his mother and aunt had mental health issues. Notes he belives they had anxiety  Family History:  Family History  Problem Relation Age of Onset   Cancer Mother        peritoneal   Arrhythmia Father    Prostate cancer Father    Colon cancer Brother 77   Cancer Maternal Grandmother        type unknown   Heart attack Maternal Grandfather    Diabetes Paternal Grandmother    Heart attack Paternal Grandfather     Social History:  Social History   Socioeconomic History   Marital status: Married    Spouse name: Not on file   Number of children: 0   Years of education: Not on file   Highest education level: Not on file  Occupational History   Occupation: Clinical research associate   Occupation: Attorney/Writer  Tobacco Use   Smoking status: Former    Current packs/day: 0.00    Types: Cigarettes    Quit date: 02/15/2022    Years since quitting: 0.4   Smokeless tobacco: Never   Tobacco comments:    x15 yrs as of 2021  Vaping Use   Vaping status: Never Used  Substance and Sexual Activity   Alcohol use: Not Currently    Comment: a couple of drinks on the weekends   Drug use: Never   Sexual activity: Yes    Birth  control/protection: None  Other Topics Concern   Not on file  Social History Narrative   2 adopted children   Social Determinants of Health   Financial Resource Strain: Not on file  Food Insecurity: No Food Insecurity (02/20/2022)   Hunger Vital Sign    Worried About Running Out of Food in the Last Year: Never true    Ran Out of Food in the Last Year: Never true  Transportation Needs: No Transportation Needs (02/20/2022)   PRAPARE - Administrator, Civil Service (Medical): No    Lack of Transportation (Non-Medical): No  Physical Activity: Not on file  Stress: Not on file  Social Connections: Not on file    Allergies: No Known Allergies  Metabolic Disorder Labs: Lab Results  Component Value Date   HGBA1C 5.8 (H) 02/17/2022   MPG 119.76 02/17/2022   MPG 119.76 08/28/2019  No results found for: "PROLACTIN" Lab Results  Component Value Date   CHOL 131 01/13/2022   TRIG 293 (H) 01/13/2022   HDL 36 (L) 01/13/2022   CHOLHDL 3.6 01/13/2022   VLDL 59 (H) 01/13/2022   LDLCALC 36 01/13/2022   LDLCALC 66 02/18/2021   Lab Results  Component Value Date   TSH 3.181 08/28/2019    Therapeutic Level Labs: No results found for: "LITHIUM" No results found for: "VALPROATE" No results found for: "CBMZ"  Current Medications: Current Outpatient Medications  Medication Sig Dispense Refill   acetaminophen (TYLENOL) 500 MG tablet Take 500 mg by mouth every 6 (six) hours as needed for headache.     aspirin 81 MG chewable tablet Chew 1 tablet (81 mg total) by mouth daily. 90 tablet 3   atorvastatin (LIPITOR) 80 MG tablet Take 1 tablet (80 mg total) by mouth daily. 30 tablet 11   carvedilol (COREG) 6.25 MG tablet Take 1 tablet (6.25 mg total) by mouth 2 (two) times daily. 60 tablet 6   dapagliflozin propanediol (FARXIGA) 10 MG TABS tablet Take 1 tablet (10 mg total) by mouth daily before breakfast. 30 tablet 11   digoxin (LANOXIN) 0.125 MG tablet Take 1/2 tablet (0.0625 mg  total) by mouth daily. 30 tablet 6   ezetimibe (ZETIA) 10 MG tablet Take 1 tablet (10 mg total) by mouth daily. 90 tablet 0   fenofibrate 160 MG tablet Take 1 tablet (160 mg total) by mouth daily. 30 tablet 3   furosemide (LASIX) 20 MG tablet Take 1 tablet (20 mg total) by mouth daily as needed for fluid or edema. 30 tablet 11   hydrOXYzine (ATARAX) 25 MG tablet Take 1 tablet (25 mg total) by mouth 3 (three) times daily as needed. 90 tablet 3   melatonin 5 MG TABS Take 2 tablets (10 mg total) by mouth at bedtime. 30 tablet 3   mirtazapine (REMERON) 45 MG tablet Take 1 tablet (45 mg total) by mouth at bedtime. 30 tablet 3   Multiple Vitamin (MULTIVITAMIN WITH MINERALS) TABS tablet Take 1 tablet by mouth daily.     omega-3 acid ethyl esters (LOVAZA) 1 g capsule TAKE 2 CAPSULES (2 GRAMS TOTAL) BY MOUTH TWO TIMES DAILY. 120 capsule 3   omeprazole (PRILOSEC) 20 MG capsule Take 20 mg by mouth daily before breakfast.     oxyCODONE (OXY IR/ROXICODONE) 5 MG immediate release tablet Take 1 tablet (5 mg total) by mouth every 4 (four) hours as needed for severe pain. 30 tablet 0   Probiotic Product (PROBIOTIC DAILY PO) Take 1 capsule by mouth with breakfast, with lunch, and with evening meal.     sacubitril-valsartan (ENTRESTO) 97-103 MG Take 1 tablet by mouth 2 (two) times daily. 60 tablet 11   sertraline (ZOLOFT) 100 MG tablet Take 2 tablets (200 mg total) by mouth daily. 60 tablet 3   SILDENAFIL CITRATE PO Take 45-90 mg by mouth daily as needed (ED). Take 1 - 2 tablets 30 to 45 min before sexual activity as needed     spironolactone (ALDACTONE) 25 MG tablet Take 1 tablet (25 mg total) by mouth daily. 90 tablet 3   varenicline (CHANTIX) 0.5 MG tablet Take 1 tablet (0.5 mg total) by mouth daily for 3 days, THEN 1 tablet (0.5 mg total) 2 (two) times daily for 3 days, THEN 2 tablets (1 mg total) 2 (two) times daily. 106 tablet 5   warfarin (COUMADIN) 5 MG tablet Take 1.5 tablets to 2 tablets by mouth  once daily  as directed by Anticoagulation clinic 55 tablet 3   No current facility-administered medications for this visit.     Musculoskeletal: Strength & Muscle Tone: Normal Gait & Station: normal Patient leans: N/A  Psychiatric Specialty Exam: Review of Systems  There were no vitals taken for this visit.There is no height or weight on file to calculate BMI.  General Appearance: Neat and Well Groomed  Eye Contact:  Good  Speech:  Clear and Coherent and Normal Rate  Volume:  Normal  Mood:  Euthymic  Affect:  Appropriate and Congruent  Thought Process:  Coherent, Goal Directed and Linear  Orientation:  Full (Time, Place, and Person)  Thought Content: WDL and Logical   Suicidal Thoughts:  No  Homicidal Thoughts:  No  Memory:  Immediate;   Good Recent;   Good Remote;   Good  Judgement:  Good  Insight:  Good  Psychomotor Activity:  Normal  Concentration:  Concentration: Good and Attention Span: Good  Recall:  Good  Fund of Knowledge: Good  Language: Good  Akathisia:  No  Handed:  Right  AIMS (if indicated): Not done  Assets:  Communication Skills Desire for Improvement Financial Resources/Insurance Housing Intimacy Social Support  ADL's:  Intact  Cognition: WNL  Sleep:  Good   Screenings: AIMS    Flowsheet Row [facility]  AIMS Total Score 0      AUDIT    Flowsheet Row [facility]  Alcohol Use Disorder Identification Test Final Score (AUDIT) 19      GAD-7    Flowsheet Row Video Visit from 08/15/2022 in [facility] Video Visit from 06/06/2022 in [facility] Video Visit from 03/26/2022 in [facility] Video Visit from 10/28/2021 in [facility] Video Visit from 07[facility]25/2021 in [facility]  Total GAD-7 Score 9 15 9 17 2       PHQ2-9    Flowsheet Row Video Visit from 08/15/2022 in [facility] Video Visit from 06/06/2022 in [facility] Video Visit from 03/26/2022 in [facility] Video Visit from 10/28/2021 in [facility] Video Visit from 07/25/2021 in [facility]  PHQ-2 Total Score 2 4 2 5 1   PHQ-9 Total Score 7 19 7 12 2       Flowsheet Row Video Visit from 06/06/2022 in [facility] Admission (Discharged) from 02/19/2022 in [facility] CV SURGICAL PROGRESSIVE CARE Pre-Admission Testing 60 from 02/17/2022 in [facility] PREADMISSION TESTING  C-SSRS RISK CATEGORY Low Risk No Risk No Risk        Assessment and Plan: Patient notes that his anxiety and depression has improved since his last visit. No medication changes made today. Patient agreeable to continue medications as prescribed.   1. Generalized anxiety disorder  Continue- mirtazapine (REMERON) 45 MG tablet; Take 1 tablet (45 mg total) by mouth at bedtime.  Dispense: 30 tablet; Refill: 3 Continue- hydrOXYzine (ATARAX) 25 MG tablet; Take 1 tablet (25 mg total) by mouth 3 (three) times daily as needed.  Dispense: 90 tablet; Refill: 3 Continue- sertraline (ZOLOFT) 100 MG tablet; Take 2 tablets (200 mg total) by mouth daily.  Dispense: 60 tablet; Refill: 3  2. Moderate episode of recurrent major depressive disorder (HCC)  Continue-  mirtazapine (REMERON) 45 MG tablet; Take 1 tablet (45 mg total) by mouth at bedtime.  Dispense: 30 tablet; Refill: 3 Continue- sertraline (ZOLOFT) 100 MG tablet; Take 2 tablets (200 mg total) by mouth daily.  Dispense: 60 tablet; Refill: 3  Follow up in 3 months Follow up with therapy  Shanna Cisco, NP 08/15/2022, 11:09 AM

## 2022-08-21 ENCOUNTER — Other Ambulatory Visit: Payer: Self-pay

## 2022-08-25 ENCOUNTER — Other Ambulatory Visit: Payer: Self-pay

## 2022-09-01 ENCOUNTER — Other Ambulatory Visit: Payer: Self-pay

## 2022-09-07 ENCOUNTER — Other Ambulatory Visit: Payer: Self-pay

## 2022-09-08 ENCOUNTER — Other Ambulatory Visit: Payer: Self-pay

## 2022-09-16 ENCOUNTER — Telehealth (HOSPITAL_COMMUNITY): Payer: Self-pay | Admitting: *Deleted

## 2022-09-16 ENCOUNTER — Ambulatory Visit (HOSPITAL_COMMUNITY)
Admission: RE | Admit: 2022-09-16 | Discharge: 2022-09-16 | Disposition: A | Discharge status: Home / Self Care | Payer: Medicaid Other | Source: Ambulatory Visit | Attending: Cardiology | Admitting: Cardiology

## 2022-09-16 ENCOUNTER — Other Ambulatory Visit: Payer: Self-pay

## 2022-09-16 ENCOUNTER — Encounter (HOSPITAL_COMMUNITY): Payer: Self-pay | Admitting: Cardiology

## 2022-09-16 VITALS — BP 110/70 | HR 96 | Wt 201.4 lb

## 2022-09-16 DIAGNOSIS — Z87891 Personal history of nicotine dependence: Secondary | ICD-10-CM | POA: Diagnosis not present

## 2022-09-16 DIAGNOSIS — Z95810 Presence of automatic (implantable) cardiac defibrillator: Secondary | ICD-10-CM | POA: Diagnosis not present

## 2022-09-16 DIAGNOSIS — Z7901 Long term (current) use of anticoagulants: Secondary | ICD-10-CM | POA: Diagnosis not present

## 2022-09-16 DIAGNOSIS — Z952 Presence of prosthetic heart valve: Secondary | ICD-10-CM | POA: Diagnosis not present

## 2022-09-16 DIAGNOSIS — I2510 Atherosclerotic heart disease of native coronary artery without angina pectoris: Secondary | ICD-10-CM | POA: Diagnosis not present

## 2022-09-16 DIAGNOSIS — I252 Old myocardial infarction: Secondary | ICD-10-CM | POA: Insufficient documentation

## 2022-09-16 DIAGNOSIS — E782 Mixed hyperlipidemia: Secondary | ICD-10-CM

## 2022-09-16 DIAGNOSIS — Z86718 Personal history of other venous thrombosis and embolism: Secondary | ICD-10-CM | POA: Diagnosis not present

## 2022-09-16 DIAGNOSIS — Z7982 Long term (current) use of aspirin: Secondary | ICD-10-CM | POA: Insufficient documentation

## 2022-09-16 DIAGNOSIS — F1010 Alcohol abuse, uncomplicated: Secondary | ICD-10-CM | POA: Diagnosis not present

## 2022-09-16 DIAGNOSIS — I5022 Chronic systolic (congestive) heart failure: Secondary | ICD-10-CM | POA: Diagnosis not present

## 2022-09-16 DIAGNOSIS — Z79899 Other long term (current) drug therapy: Secondary | ICD-10-CM | POA: Insufficient documentation

## 2022-09-16 DIAGNOSIS — I350 Nonrheumatic aortic (valve) stenosis: Secondary | ICD-10-CM | POA: Diagnosis not present

## 2022-09-16 LAB — BASIC METABOLIC PANEL
Anion gap: 13 (ref 5–15)
BUN: 15 mg/dL (ref 6–20)
CO2: 26 mmol/L (ref 22–32)
Calcium: 10.1 mg/dL (ref 8.9–10.3)
Chloride: 100 mmol/L (ref 98–111)
Creatinine, Ser: 1.14 mg/dL (ref 0.61–1.24)
GFR, Estimated: >60 mL/min (ref >60)
Glucose, Bld: 116 mg/dL — ABNORMAL HIGH (ref 70–99)
Potassium: 4.3 mmol/L (ref 3.5–5.1)
Sodium: 139 mmol/L (ref 135–145)

## 2022-09-16 LAB — CBC
HCT: 45.9 % (ref 39.0–52.0)
Hemoglobin: 14.9 g/dL (ref 13.0–17.0)
MCH: 27 pg (ref 26.0–34.0)
MCHC: 32.5 g/dL (ref 30.0–36.0)
MCV: 83.2 fL (ref 80.0–100.0)
Platelets: 310 10*3/uL (ref 150–400)
RBC: 5.52 MIL/uL (ref 4.22–5.81)
RDW: 15.3 % (ref 11.5–15.5)
WBC: 6.8 10*3/uL (ref 4.0–10.5)
nRBC: 0 % (ref 0.0–0.2)

## 2022-09-16 LAB — BRAIN NATRIURETIC PEPTIDE: B Natriuretic Peptide: 38.4 pg/mL (ref 0.0–100.0)

## 2022-09-16 LAB — DIGOXIN LEVEL: Digoxin Level: <0.2 ng/mL — ABNORMAL LOW (ref 0.8–2.0)

## 2022-09-16 LAB — LIPID PANEL
Cholesterol: 165 mg/dL (ref 0–200)
HDL: 45 mg/dL (ref >40)
LDL Cholesterol: 46 mg/dL (ref 0–99)
Total CHOL/HDL Ratio: 3.7 RATIO
Triglycerides: 368 mg/dL — ABNORMAL HIGH (ref <150)
VLDL: 74 mg/dL — ABNORMAL HIGH (ref 0–40)

## 2022-09-16 MED ORDER — SPIRONOLACTONE 50 MG PO TABS
50.0000 mg | ORAL_TABLET | Freq: Every day | ORAL | 6 refills | Status: DC
  Filled 2022-09-16: qty 30, 30d supply, fill #0
  Filled 2022-10-25: qty 30, 30d supply, fill #1
  Filled 2022-11-30: qty 30, 30d supply, fill #2
  Filled 2022-12-27: qty 30, 30d supply, fill #3
  Filled 2023-01-25: qty 30, 30d supply, fill #4
  Filled 2023-02-22: qty 30, 30d supply, fill #5
  Filled 2023-03-29: qty 30, 30d supply, fill #6

## 2022-09-16 MED ORDER — FUROSEMIDE 20 MG PO TABS: 20.0000 mg | ORAL_TABLET | Freq: Every day | ORAL | Status: DC

## 2022-09-16 NOTE — Telephone Encounter (Signed)
-----   Message from Nurse Ethel Rana sent at 09/16/2022  3:19 PM EDT ----- Elvina Sidle, I know you guys closed his referral b/c he wasn't returning the call but he states that he really wants to participate in cardiac rehab, can you guys try to reach out again please, if I need to place a new order just let me know. I did let him know he needs to answer phone and return calls or we wouldn't refer again, thanks

## 2022-09-16 NOTE — Patient Instructions (Signed)
Medication Changes:  Take Furosemide 20 mg Daily  Increase Spironolactone to 50 mg Daily  Lab Work:  Labs done today, your results will be available in MyChart, we will contact you for abnormal readings.  Your physician recommends that you return for lab work in: 1-2 weeks  Testing/Procedures:  none  Referrals:  none  Special Instructions // Education:  Do the following things EVERYDAY: Weigh yourself in the morning before breakfast. Write it down and keep it in a log. Take your medicines as prescribed Eat low salt foods--Limit salt (sodium) to 2000 mg per day.  Stay as active as you can everyday Limit all fluids for the day to less than 2 liters   Follow-Up in: 6 weeks  At the [facility], you and your health needs are our priority. We have a designated team specialized in the treatment of Heart Failure. This Care Team includes your primary Heart Failure Specialized Cardiologist (physician), Advanced Practice Providers (APPs- Physician Assistants and Nurse Practitioners), and Pharmacist who all work together to provide you with the care you need, when you need it.   You may see any of the following providers on your designated Care Team at your next follow up:  Dr. Arvilla Meres Dr. Marca Ancona Dr. Marcos Eke, NP Robbie Lis, [place] [facility] [place], [place] Brynda Peon, NP Karle Plumber, PharmD   Please be sure to bring in all your medications bottles to every appointment.   Need to Contact us:  If you have any questions or concerns before your next appointment please send [place] a message through [place] or call our office at [phone].    TO LEAVE A MESSAGE FOR THE NURSE SELECT OPTION 2, PLEASE LEAVE A MESSAGE INCLUDING: YOUR NAME DATE OF BIRTH CALL BACK NUMBER REASON FOR CALL**this is important as we prioritize the call backs  YOU WILL RECEIVE A CALL BACK THE SAME DAY AS LONG AS YOU CALL BEFORE 4:00  PM

## 2022-09-16 NOTE — Progress Notes (Signed)
Advanced Heart Failure Clinic Note   PCP: Pcp, [name] Cardiology: Dr. Shirlee Latch   HPI: 57 y.o. male smoker was admitted on 08/28/19 for delayed presentation anterior STEMI. Had had >36 hr of chest pain prior to seeking medical attention. Pain progressed to more pleuritic like CP. Found to have anterior ST elevations w/ precordial Q waves on admit. Hs troponin >27,000. Urgent cardiac cath showed totally occluded mid LAD w/o collaterals and 40% mid RCA stenosis. It was suspected he had completed his infarct and residual pleuritic CP was post-MI pericarditis. No intervention was performed. 2D echo demonstrated moderately reduced LVEF, 45-50%, + apical thrombus and moderate AS. MV normal. RV systolic function mildly reduced.    He was placed on medical management for CAD and systolic HF. He was started on ASA, atorvastatin, Losartan and Coreg. Coumadin started for apical thrombus w/ heparin bridge.   Post cath, there were initial concerns for developing cardiogenic shock but he remained stable and did not require pressor/inotropic support. Co-ox remained stable. Repeat echo showed further reduction of LVEF down to 30-35% but no post MI mechanical complications. RV mildly reduced. He did require IV Lasix for pulmonary edema and volume status and dyspnea improved w/ diuresis. He was continued on GTDMT w/ losartan, spironolactone and Coreg. BP too soft for Entresto. He was treated w/ colchicine for post MI pericarditis w/ improvement in pleuritic chest pain. Given anterior MI w/ EF <35%, he was fitted w/ a LifeVest prior to discharge w/ plans to get repeat echo in 1 month.   He had repeat echo in 9/21, showing EF 25-30%.  He had [employer] ICD placed in 11/21.  Echo in 8/22 showed EF 30% with no LV thrombus, normal RV, normal IVC, moderate AS with mean gradient 28 mmHg and AVA 1.08 cm^2. Echo in 12/22 showed EF 30-35%, no LV thrombus, possibly bicuspid aortic valve with mean gradient 34 mmHg and AVA 1.02  cm^2, normal RV, IVC normal.   Echo was done in 8/23 showing LV thrombus.  INR goal on warfarin was increased to 2.5-3.5.  Echo 10/23 showed EF remains 25-30% with peri-apical akinesis and a chronic-appearing LV thrombus, severe low flow/low gradient aortic stenosis (mean gradient 27 mmHg with AVA 0.88 cm^2), bicuspid aortic valve, normal RV, normal IVC. INR increased to 3-3.5.  We have discussed barostimulation activation therapy, but Medicaid does not have good coverage for this.   Follow up 10/23, continued with NYHA II-III symptoms. Referred to structural heart team for TAVR work up, underwent L/RHC showing chronic occlusion of mLAD without target and moderate RCA stenosis, severe AS and normal right heart pressures. Decided SAVR was best option with LV thrombus.  Seen in ED 01/11/22 with acute ETOH intoxication and LOC. BP low, given IVF with improvement. Discharged home.  In 1/24, he had AVR with St Jude mechanical AoV.  Post-op, he was taken off dapagliflozin, Entresto, and spironolactone. He quit smoking a couple of weeks before AVR.   Today he returns for followup of CHF.  He has quit smoking again using Chantix and has also quit drinking for the last 3 months. Weight is up about 8 lbs.  He rarely uses prn Lasix.  He has mild dyspnea waling up stairs or carrying a heavy load, no dyspnea walking on flat ground.  No orthopnea/PND.  No lightheadedness, no chest pain.   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   Labs (9/21):  K 4.3, creatinine 0.93 Labs (11/21): LDL 67, HDL 37, K 4.4, creatinine 5.17 Labs (1/22): digoxin 0.5, K 4, creatinine 1.1.  Labs (5/22): digoxin 0.9, K 4.4, creatinine 1.06 Labs (8/22): K 4.3, creatinine 0.91, TGs 343, LDL 65, HDL 31, digoxin 1.0 Labs (61/60): K 4.1, creatinine 1.14, pro-BNP 120 Labs (1/23): hgb 14.7, LDL 66, TGs 99 Labs (5/23): digoxin  0.3, K 4.3, creatinine 1.17 Labs (8/23): digoxin 0.2, K 4.5, creatinine 1.17 Labs (12/23): K 5.3, creatinine 1.07, hgb 15.6, LDL 36 Labs (1/24): K 3.9, creatinine 1.08 Labs (3/24): K 4.2, SCr 1.18 Labs (5/24): K 4.6, creatinine 1.04, hgb 11.9  PMH: 1. GERD 2. Depression  3. CAD: Anterior STEMI in 8/21 with totally occluded LAD/no collaterals, 40% mid RCA.  Due to late presentation, no PCI was done.  - Had post-infarct pericarditis.  - LHC (11/23): mLAD 100% stenosed, pRCA 50% stenosed 4. Chronic systolic CHF: Ischemic cardiomyopathy. [employer] ICD.  - Echo (9/21) with EF 25-30%, normal RV, moderate AS with mean gradient 22 mmHg.  - Echo (8/22): EF 30% with no LV thrombus, normal RV, normal IVC, moderate AS with mean gradient 28 mmHg and AVA 1.08 cm^2.  - Echo (12/22): EF 30-35%, no LV thrombus, possibly bicuspid aortic valve with mean gradient 34 mmHg and AVA 1.02 cm^2, normal RV, IVC normal.  - Echo (10/23): EF remains 25-30% with peri-apical akinesis and a chronic-appearing LV thrombus, severe low flow/low gradient aortic stenosis (mean gradient 27 mmHg with AVA 0.88 cm^2), bicuspid aortic valve, normal RV, normal IVC. - R/LHC (11/23): mLAD 100% stenosed, pRCA 50% stenosed; RA mean 3, PA 25/14 mean 18, PCWP 13, CO/CI (Fick) 5.03/2.54 - TEE 1/24 EF 20-30%, normal RV 5. LV thrombus: Apical thrombus noted on echo at time of MI in 8/21. Resolved by 9/21 echo.  - Echo 10/23 with chronic-appearing, immobile LV thrombus.  6. Aortic stenosis: Bicuspid valve. Moderate on 12/22 echo.  - 10/23 echo showed low flow/low gradient severe AS.   - St Jude mechanical AVR in 1/24 7. Prior smoker.   Current Outpatient Medications  Medication Sig Dispense Refill   acetaminophen (TYLENOL) 500 MG tablet Take 500 mg by mouth every 6 (six) hours as needed for headache.     aspirin 81 MG chewable tablet Chew 1 tablet (81 mg total) by mouth daily. 90 tablet 3   atorvastatin (LIPITOR) 80 MG tablet Take  1 tablet (80 mg total) by mouth daily. 30 tablet 11   carvedilol (COREG) 6.25 MG tablet Take 1 tablet (6.25 mg total) by mouth 2 (two) times daily. 60 tablet 6   dapagliflozin propanediol (FARXIGA) 10 MG TABS tablet Take 1 tablet (10 mg total) by mouth daily before breakfast. 30 tablet 11   digoxin (LANOXIN) 0.125 MG tablet Take 1/2 tablet (0.0625 mg total) by mouth daily. 30 tablet 6   ezetimibe (ZETIA) 10 MG tablet Take 1 tablet (10 mg total) by mouth daily. 90 tablet 0   fenofibrate 160 MG tablet Take 1 tablet (160 mg total) by mouth daily. 30 tablet 3   hydrOXYzine (ATARAX) 25 MG tablet Take 1 tablet (25 mg total) by mouth 3 (three) times daily as needed. 90 tablet 3   melatonin 5 MG TABS Take 2 tablets (10 mg total) by mouth at bedtime. 30 tablet 3   mirtazapine (REMERON) 45 MG tablet Take 1 tablet (45 mg total) by mouth at bedtime. 30 tablet 3   Multiple Vitamin (MULTIVITAMIN WITH MINERALS) TABS tablet Take 1 tablet by  mouth daily.     omega-3 acid ethyl esters (LOVAZA) 1 g capsule TAKE 2 CAPSULES (2 GRAMS TOTAL) BY MOUTH TWO TIMES DAILY. 120 capsule 3   omeprazole (PRILOSEC) 20 MG capsule Take 20 mg by mouth daily before breakfast.     Probiotic Product (PROBIOTIC DAILY PO) Take 1 capsule by mouth with breakfast, with lunch, and with evening meal.     sacubitril-valsartan (ENTRESTO) 97-103 MG Take 1 tablet by mouth 2 (two) times daily. 60 tablet 11   sertraline (ZOLOFT) 100 MG tablet Take 2 tablets (200 mg total) by mouth daily. 60 tablet 3   SILDENAFIL CITRATE PO Take 45-90 mg by mouth daily as needed (ED). Take 1 - 2 tablets 30 to 45 min before sexual activity as needed     varenicline (CHANTIX) 1 MG tablet Take 2 mg by mouth 2 (two) times daily.     warfarin (COUMADIN) 5 MG tablet Take 1.5 tablets to 2 tablets by mouth once daily as directed by Anticoagulation clinic 55 tablet 3   furosemide (LASIX) 20 MG tablet Take 1 tablet (20 mg total) by mouth daily.     spironolactone (ALDACTONE)  50 MG tablet Take 1 tablet (50 mg total) by mouth daily. 30 tablet 6   No current facility-administered medications for this encounter.   No Known Allergies  Social History   Socioeconomic History   Marital status: Married    Spouse name: Not on file   Number of children: 0   Years of education: Not on file   Highest education level: Not on file  Occupational History   Occupation: Clinical research associate   Occupation: Attorney/Writer  Tobacco Use   Smoking status: Former    Current packs/day: 0.00    Types: Cigarettes    Quit date: 02/15/2022    Years since quitting: 0.5   Smokeless tobacco: Never   Tobacco comments:    x15 yrs as of 2021  Vaping Use   Vaping status: Never Used  Substance and Sexual Activity   Alcohol use: Not Currently    Comment: a couple of drinks on the weekends   Drug use: Never   Sexual activity: Yes    Birth control/protection: None  Other Topics Concern   Not on file  Social History Narrative   2 adopted children   Social Determinants of Health   Financial Resource Strain: Not on file  Food Insecurity: No Food Insecurity (02/20/2022)   Hunger Vital Sign    Worried About Running Out of Food in the Last Year: Never true    Ran Out of Food in the Last Year: Never true  Transportation Needs: No Transportation Needs (02/20/2022)   PRAPARE - Administrator, Civil Service (Medical): No    Lack of Transportation (Non-Medical): No  Physical Activity: Not on file  Stress: Not on file  Social Connections: Not on file  Intimate Partner Violence: Not At Risk (02/20/2022)   Humiliation, Afraid, Rape, and Kick questionnaire    Fear of Current or Ex-Partner: No    Emotionally Abused: No    Physically Abused: No    Sexually Abused: No   Family History  Problem Relation Age of Onset   Cancer Mother        peritoneal   Arrhythmia Father    Prostate cancer Father    Colon cancer Brother 37   Cancer Maternal Grandmother        type unknown   Heart attack  Maternal Grandfather  Diabetes Paternal Grandmother    Heart attack Paternal Grandfather    BP 110/70   Pulse 96   Wt 91.4 kg (201 lb 6.4 oz)   SpO2 94%   BMI 29.74 kg/m   Wt Readings from Last 3 Encounters:  09/16/22 91.4 kg (201 lb 6.4 oz)  06/20/22 87.8 kg (193 lb 9.6 oz)  04/15/22 87.7 kg (193 lb 6.4 oz)   PHYSICAL EXAM: General: NAD Neck: JVP 8 cm, no thyromegaly or thyroid nodule.  Lungs: Clear to auscultation bilaterally with normal respiratory effort. CV: Nondisplaced PMI.  Heart regular S1/S2 with mechanical S2, no S3/S4, no murmur.  No peripheral edema.  No carotid bruit.  Normal pedal pulses.  Abdomen: Soft, nontender, no hepatosplenomegaly, no distention.  Skin: Intact without lesions or rashes.  Neurologic: Alert and oriented x 3.  Psych: Normal affect. Extremities: No clubbing or cyanosis.  HEENT: Normal.   ASSESSMENT & PLAN: 1. CAD: Admission 8/21 for acute anterior MI, delayed presentation with no ischemic pain, had pleuritic-type chest pain (post-MI pericarditis). LHC with totally occluded mid LAD w/o collaterals. Suspected completed MI, no intervention.  Repeat LHC in 11/23 with mLAD 100% stenosed, pRCA 50% stenosed.  No ischemic chest pain.  - Continue ASA 81 daily.   - Continue atorvastatin 80 mg daily and Zetia 10 daily, check lipids today.   2. Chronic systolic CHF: Ischemic cardiomyopathy.  Reduced EF post anterior MI.  Echo in 9/21 with EF 25-30%, peri-apical akinesis, moderate AS. Cath w/ occluded mLAD but no intervention as he had completed his infarct. He has [employer] ICD.  Echo in 8/22 with EF 30%.  Echo in 12/22 with EF 30-35%, normal RV, moderate AS. Echo 10/23 showed EF remains 25-30% with peri-apical akinesis and a chronic-appearing LV thrombus, severe low flow/low gradient aortic stenosis (mean gradient 27 mmHg with AVA 0.88 cm^2), bicuspid aortic valve, normal RV, normal IVC.  Now s/p mechanical AVR.  TEE 1/24 EF 20-30%, normal RV.  NYHA  class II symptoms with weight up and HeartLogic score suggestive of volume overload.  Possible mild volume overload on exam.  - Start Lasix 20 mg daily.  BMET/BNP today and again in 10 days.  - Continue Entresto 97/103 bid. - Continue spironolactone 25 mg daily.  - Continue Coreg 6.25 mg bid.  - Continue dapagliflozin 10 mg daily.  - Continue digoxin 0.0625, check level today.  - I will arrange for echo.  - Would like him to get barostimulation activation therapy device but Medicaid will not cover.   3. 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   4. LV thrombus: Large LV thrombus noted on echo in 8/21, resolved on 9/21 echo.  Thrombus had recurred on 8/23 echo and warfarin was increased to INR goal 2.5-3.5 and ASA 81 was added.  Echo 10/23, there was still an LV thrombus, though it was smaller than in 8/23. Not mobile.  - Continue warfarin with INR goal 2.5-3.5 and continue ASA 81 daily.  5. Tobacco use: He has quit smoking again using Chantix.  6. Hyperlipidemia: Check lipids today.   7. Aortic stenosis: Bicuspid aortic valve with moderate AS on 12/22 echo. Echo in 10/23 showed severe low flow/low gradient aortic stenosis with mean gradient 27 mmHg and AVA 0.88 cm^2.  He had SAVR 02/19/22 with Dr. Leafy Ro with St Jude mechanical aortic valve.  - Needs echo for baseline mechanical valve assessment.  - Continue ASA 81 + warfarin (INR goal 2.5-3.5).  - Will re-refer for cardiac rehab. He  is ready to do it now.  8. Alcohol abuse: He has quit x 3 months.   Follow up with APP in 6 wks.   Marca Ancona, MD  09/16/2022

## 2022-09-17 ENCOUNTER — Other Ambulatory Visit (HOSPITAL_COMMUNITY): Payer: Self-pay | Admitting: Cardiology

## 2022-09-18 ENCOUNTER — Ambulatory Visit: Payer: Medicaid Other | Attending: Cardiovascular Disease

## 2022-09-18 ENCOUNTER — Other Ambulatory Visit: Payer: Self-pay

## 2022-09-18 DIAGNOSIS — I513 Intracardiac thrombosis, not elsewhere classified: Secondary | ICD-10-CM | POA: Diagnosis not present

## 2022-09-18 DIAGNOSIS — Z7901 Long term (current) use of anticoagulants: Secondary | ICD-10-CM

## 2022-09-18 DIAGNOSIS — Z5181 Encounter for therapeutic drug level monitoring: Secondary | ICD-10-CM | POA: Diagnosis not present

## 2022-09-18 LAB — POCT INR: INR: 3.1 — AB (ref 2.0–3.0)

## 2022-09-18 MED ORDER — EZETIMIBE 10 MG PO TABS
10.0000 mg | ORAL_TABLET | Freq: Every day | ORAL | 3 refills | Status: DC
  Filled 2022-09-18: qty 90, 90d supply, fill #0
  Filled 2022-12-20: qty 90, 90d supply, fill #1
  Filled 2023-03-21: qty 90, 90d supply, fill #2
  Filled 2023-06-12: qty 90, 90d supply, fill #3

## 2022-09-18 NOTE — Patient Instructions (Signed)
Description   Continue taking warfarin 1.5 tablets daily except 2 tablets on Mondays and Fridays.  Recheck INR 6 weeks.  [facility] [phone] or [phone]  [place] [id number]

## 2022-09-19 ENCOUNTER — Other Ambulatory Visit (HOSPITAL_COMMUNITY): Payer: Self-pay

## 2022-09-19 ENCOUNTER — Telehealth (HOSPITAL_COMMUNITY): Payer: Self-pay

## 2022-09-19 NOTE — Telephone Encounter (Signed)
Will insurance cover Vascepa for him?

## 2022-09-22 ENCOUNTER — Other Ambulatory Visit: Payer: Self-pay

## 2022-09-23 ENCOUNTER — Other Ambulatory Visit (HOSPITAL_COMMUNITY): Payer: Medicaid Other

## 2022-09-24 NOTE — Telephone Encounter (Signed)
See below, please advise.

## 2022-09-27 ENCOUNTER — Other Ambulatory Visit (HOSPITAL_COMMUNITY): Payer: Self-pay | Admitting: Cardiology

## 2022-09-30 ENCOUNTER — Other Ambulatory Visit: Payer: Self-pay

## 2022-09-30 ENCOUNTER — Other Ambulatory Visit (HOSPITAL_COMMUNITY): Payer: Self-pay | Admitting: Cardiology

## 2022-09-30 MED ORDER — FUROSEMIDE 20 MG PO TABS
20.0000 mg | ORAL_TABLET | Freq: Every day | ORAL | 11 refills | Status: DC | PRN
  Filled 2022-09-30: qty 30, 30d supply, fill #0
  Filled 2022-10-25: qty 30, 30d supply, fill #1

## 2022-10-01 ENCOUNTER — Ambulatory Visit (HOSPITAL_COMMUNITY)
Admission: RE | Admit: 2022-10-01 | Discharge: 2022-10-01 | Disposition: A | Discharge status: Home / Self Care | Payer: Medicaid Other | Source: Ambulatory Visit | Attending: Cardiology | Admitting: Cardiology

## 2022-10-01 ENCOUNTER — Other Ambulatory Visit: Payer: Self-pay

## 2022-10-01 DIAGNOSIS — I5022 Chronic systolic (congestive) heart failure: Secondary | ICD-10-CM | POA: Insufficient documentation

## 2022-10-01 LAB — BASIC METABOLIC PANEL
Anion gap: 8 (ref 5–15)
BUN: 19 mg/dL (ref 6–20)
CO2: 25 mmol/L (ref 22–32)
Calcium: 9.5 mg/dL (ref 8.9–10.3)
Chloride: 101 mmol/L (ref 98–111)
Creatinine, Ser: 1.13 mg/dL (ref 0.61–1.24)
GFR, Estimated: >60 mL/min (ref >60)
Glucose, Bld: 100 mg/dL — ABNORMAL HIGH (ref 70–99)
Potassium: 4.3 mmol/L (ref 3.5–5.1)
Sodium: 134 mmol/L — ABNORMAL LOW (ref 135–145)

## 2022-10-01 MED ORDER — FUROSEMIDE 20 MG PO TABS
20.0000 mg | ORAL_TABLET | Freq: Every day | ORAL | 6 refills | Status: DC
  Filled 2022-10-01 – 2022-11-23 (×3): qty 30, 30d supply, fill #0
  Filled 2022-12-20: qty 30, 30d supply, fill #1
  Filled 2023-01-17: qty 30, 30d supply, fill #2
  Filled 2023-02-13: qty 30, 30d supply, fill #3
  Filled 2023-03-08: qty 30, 30d supply, fill #4
  Filled 2023-04-04: qty 30, 30d supply, fill #5
  Filled 2023-05-02: qty 30, 30d supply, fill #6

## 2022-10-03 NOTE — Telephone Encounter (Signed)
Patient advised and verbalized understanding. Will recheck blood work at 10/28/22 appointment.

## 2022-10-08 ENCOUNTER — Telehealth (HOSPITAL_COMMUNITY): Payer: Self-pay

## 2022-10-08 NOTE — Telephone Encounter (Signed)
  Called pt to confirm appt for 10/09/22 at 1315. Gave pt instructions for appt, what to wear, office address, eating/taking meds before, and if sick to call and reschedule. Pt voiced understanding, all questions answered.   Health history completed? Yes   Jonna Coup, MS, ACSM-CEP 10/08/2022 2:51 PM

## 2022-10-08 NOTE — Telephone Encounter (Signed)
Pt insurance is active and benefits verified through [employer] $4, DED 0/0 met, out of pocket 0/0 met, co-insurance 0%. no pre-authorization required, Jalisa/BCBS 10/08/2022@1 :56, REF# [id number]  How many CR sessions are covered? (for TCR) no limit Is this a lifetime maximum or an annual maximum? Annual  Has the member used any of these services to date? no Is there a time limit (weeks/months) on start of program and/or program completion? no

## 2022-10-08 NOTE — Telephone Encounter (Signed)
Called patient to see if he was interested in participating in the Cardiac Rehab Program. Patient stated yes. Patient will come in for orientation on 9/12@115  and will attend the 1015 exercise class.   Mailed package

## 2022-10-09 ENCOUNTER — Encounter (HOSPITAL_COMMUNITY)
Admission: RE | Admit: 2022-10-09 | Discharge: 2022-10-09 | Disposition: A | Discharge status: Home / Self Care | Payer: Medicaid Other | Source: Ambulatory Visit | Attending: Cardiology | Admitting: Cardiology

## 2022-10-09 VITALS — BP 112/74 | HR 77 | Ht 69.0 in | Wt 204.8 lb

## 2022-10-09 DIAGNOSIS — I429 Cardiomyopathy, unspecified: Secondary | ICD-10-CM | POA: Insufficient documentation

## 2022-10-09 DIAGNOSIS — I10 Essential (primary) hypertension: Secondary | ICD-10-CM | POA: Insufficient documentation

## 2022-10-09 NOTE — Progress Notes (Signed)
Cardiac Individual Treatment Plan  Patient Details  Name: Craig Flores MRN: [id number] Date of Birth: 07/25/1965 Referring Provider:   Flowsheet Row CARDIAC REHAB PHASE II ORIENTATION from 10/09/2022 in [facility]  Referring Provider Dr. Marca Ancona, MD       Initial Encounter Date:  Flowsheet Row CARDIAC REHAB PHASE II ORIENTATION from 10/09/2022 in [facility]  Date 10/09/22       Visit Diagnosis: Cardiomyopathy, unspecified type (HCC)  Patient's Home Medications on Admission:  Current Outpatient Medications:    acetaminophen (TYLENOL) 500 MG tablet, Take 500 mg by mouth every 6 (six) hours as needed for headache., Disp: , Rfl:    aspirin 81 MG chewable tablet, Chew 1 tablet (81 mg total) by mouth daily., Disp: 90 tablet, Rfl: 3   atorvastatin (LIPITOR) 80 MG tablet, Take 1 tablet (80 mg total) by mouth daily., Disp: 30 tablet, Rfl: 11   carvedilol (COREG) 6.25 MG tablet, Take 1 tablet (6.25 mg total) by mouth 2 (two) times daily., Disp: 60 tablet, Rfl: 6   dapagliflozin propanediol (FARXIGA) 10 MG TABS tablet, Take 1 tablet (10 mg total) by mouth daily before breakfast., Disp: 30 tablet, Rfl: 11   digoxin (LANOXIN) 0.125 MG tablet, Take 1/2 tablet (0.0625 mg total) by mouth daily., Disp: 30 tablet, Rfl: 6   ezetimibe (ZETIA) 10 MG tablet, Take 1 tablet (10 mg total) by mouth daily., Disp: 90 tablet, Rfl: 3   fenofibrate 160 MG tablet, Take 1 tablet (160 mg total) by mouth daily., Disp: 30 tablet, Rfl: 3   furosemide (LASIX) 20 MG tablet, Take 1 tablet (20 mg total) by mouth daily., Disp: 30 tablet, Rfl: 6   hydrOXYzine (ATARAX) 25 MG tablet, Take 1 tablet (25 mg total) by mouth 3 (three) times daily as needed., Disp: 90 tablet, Rfl: 3   melatonin 5 MG TABS, Take 2 tablets (10 mg total) by mouth at bedtime., Disp: 30 tablet, Rfl: 3   mirtazapine (REMERON) 45 MG  tablet, Take 1 tablet (45 mg total) by mouth at bedtime., Disp: 30 tablet, Rfl: 3   Multiple Vitamin (MULTIVITAMIN WITH MINERALS) TABS tablet, Take 1 tablet by mouth daily., Disp: , Rfl:    omega-3 acid ethyl esters (LOVAZA) 1 g capsule, TAKE 2 CAPSULES (2 GRAMS TOTAL) BY MOUTH TWO TIMES DAILY., Disp: 120 capsule, Rfl: 3   omeprazole (PRILOSEC) 20 MG capsule, Take 20 mg by mouth daily before breakfast., Disp: , Rfl:    Probiotic Product (PROBIOTIC DAILY PO), Take 1 capsule by mouth with breakfast, with lunch, and with evening meal., Disp: , Rfl:    sacubitril-valsartan (ENTRESTO) 97-103 MG, Take 1 tablet by mouth 2 (two) times daily., Disp: 60 tablet, Rfl: 11   sertraline (ZOLOFT) 100 MG tablet, Take 2 tablets (200 mg total) by mouth daily., Disp: 60 tablet, Rfl: 3   SILDENAFIL CITRATE PO, Take 45-90 mg by mouth daily as needed (ED). Take 1 - 2 tablets 30 to 45 min before sexual activity as needed, Disp: , Rfl:    spironolactone (ALDACTONE) 50 MG tablet, Take 1 tablet (50 mg total) by mouth daily., Disp: 30 tablet, Rfl: 6   varenicline (CHANTIX) 1 MG tablet, Take 2 mg by mouth 2 (two) times daily., Disp: , Rfl:    warfarin (COUMADIN) 5 MG tablet, Take 1.5 tablets to 2 tablets by mouth once daily as directed by Anticoagulation clinic, Disp:  55 tablet, Rfl: 3   furosemide (LASIX) 20 MG tablet, Take 1 tablet (20 mg total) by mouth daily as needed for fluid or edema., Disp: 30 tablet, Rfl: 11  Past Medical History: Past Medical History:  Diagnosis Date   Acid reflux    AICD (automatic cardioverter/defibrillator) present    Anxiety    Aortic stenosis with bicuspid valve    CHF (congestive heart failure) (HCC)    Depression    Dyspnea    GERD (gastroesophageal reflux disease)    HLD (hyperlipidemia)    Ischemic cardiomyopathy    Migraines    Myocardial infarction (HCC) 08/28/2019    Tobacco Use: Social History   Tobacco Use  Smoking Status Former   Current packs/day: 0.00   Types:  Cigarettes   Quit date: 02/15/2022   Years since quitting: 0.6  Smokeless Tobacco Never  Tobacco Comments   x15 yrs as of 2021    Labs: Review Flowsheet  More data exists      Latest Ref Rng & Units 01/11/2022 01/13/2022 02/17/2022 02/19/2022 09/16/2022  Labs for ITP Cardiac and Pulmonary Rehab  Cholestrol 0 - 200 mg/dL - 409  - - 811   LDL (calc) 0 - 99 mg/dL - 36  - - 46   HDL-C >91 mg/dL - 36  - - 45   Trlycerides <150 mg/dL - 478  - - 295   Hemoglobin A1c 4.8 - 5.6 % - - 5.8  - -  PH, Arterial 7.35 - 7.45 - - 7.44  7.304  7.295  7.290  7.312  7.353  7.259  7.328  7.327  -  PCO2 arterial 32 - 48 mmHg - - 39  45.9  46.7  47.5  47.8  38.4  55.3  45.6  44.1  -  Bicarbonate 20.0 - 28.0 mmol/L - - 26.5  22.8  22.8  23.1  24.2  21.4  24.8  24.8  24.0  23.1  -  TCO2 22 - 32 mmol/L 20  - - 24  24  25  24  26  23  26  27  26  25  26  24  24   -  Acid-base deficit 0.0 - 2.0 mmol/L - - - 4.0  4.0  4.0  2.0  4.0  3.0  2.0  2.0  3.0  -  O2 Saturation % - - 100  95  97  95  99  100  100  82  100  100  -    Details       Multiple values from one day are sorted in reverse-chronological order         Capillary Blood Glucose: Lab Results  Component Value Date   GLUCAP 87 02/23/2022   GLUCAP 100 (H) 02/23/2022   GLUCAP 91 02/23/2022   GLUCAP 117 (H) 02/22/2022   GLUCAP 104 (H) 02/22/2022     Exercise Target Goals: Exercise Program Goal: Individual exercise prescription set using results from initial 6 min walk test and THRR while considering  patient's activity barriers and safety.   Exercise Prescription Goal: Initial exercise prescription builds to 30-45 minutes a day of aerobic activity, 2-3 days per week.  Home exercise guidelines will be given to patient during program as part of exercise prescription that the participant will acknowledge.  Activity Barriers & Risk Stratification:  Activity Barriers & Cardiac Risk Stratification - 10/09/22 1527       Activity Barriers &  Cardiac Risk Stratification  Activity Barriers History of Falls    Cardiac Risk Stratification High   under 5 MET's on            6 Minute Walk:  6 Minute Walk     Row Name 10/09/22 1522         6 Minute Walk   Phase Initial     Distance 1440 feet     Walk Time 6 minutes     # of Rest Breaks 0     MPH 2.73     METS 2.98     RPE 11     Perceived Dyspnea  0     VO2 Peak 10.44     Symptoms No     Resting HR 77 bpm     Resting BP 112/74     Resting Oxygen Saturation  96 %     Exercise Oxygen Saturation  during 6 min walk 98 %     Max Ex. HR 98 bpm     Max Ex. BP 130/82     2 Minute Post BP 114/74              Oxygen Initial Assessment:   Oxygen Re-Evaluation:   Oxygen Discharge (Final Oxygen Re-Evaluation):   Initial Exercise Prescription:  Initial Exercise Prescription - 10/09/22 1500       Date of Initial Exercise RX and Referring Provider   Date 10/09/22    Referring Provider Dr. Marca Ancona, MD    Expected Discharge Date 12/31/22      Treadmill   MPH 2.4    Grade 0    Minutes 15    METs 2.84      Bike   Level 2    Watts 40    Minutes 30    METs 2.5      Prescription Details   Frequency (times per week) 3    Duration Progress to 30 minutes of continuous aerobic without signs/symptoms of physical distress      Intensity   THRR 40-80% of Max Heartrate 65-131    Ratings of Perceived Exertion 11-13    Perceived Dyspnea 0-4      Progression   Progression Continue progressive overload as per policy without signs/symptoms or physical distress.      Resistance Training   Training Prescription Yes   Will need cuff weight for left hand   Weight 3    Reps 10-15             Perform Capillary Blood Glucose checks as needed.  Exercise Prescription Changes:   Exercise Comments:   Exercise Goals and Review:   Exercise Goals     Row Name 10/09/22 1358             Exercise Goals   Increase Physical Activity Yes        Intervention Provide advice, education, support and counseling about physical activity/exercise needs.;Develop an individualized exercise prescription for aerobic and resistive training based on initial evaluation findings, risk stratification, comorbidities and participant's personal goals.       Expected Outcomes Short Term: Attend rehab on a regular basis to increase amount of physical activity.;Long Term: Exercising regularly at least 3-5 days a week.;Long Term: Add in home exercise to make exercise part of routine and to increase amount of physical activity.       Increase Strength and Stamina Yes       Intervention Provide advice, education, support and counseling about physical activity/exercise needs.;Develop  an individualized exercise prescription for aerobic and resistive training based on initial evaluation findings, risk stratification, comorbidities and participant's personal goals.       Expected Outcomes Short Term: Increase workloads from initial exercise prescription for resistance, speed, and METs.;Short Term: Perform resistance training exercises routinely during rehab and add in resistance training at home;Long Term: Improve cardiorespiratory fitness, muscular endurance and strength as measured by increased METs and functional capacity ( )       Able to understand and use rate of perceived exertion (RPE) scale Yes       Intervention Provide education and explanation on how to use RPE scale       Expected Outcomes Short Term: Able to use RPE daily in rehab to express subjective intensity level;Long Term:  Able to use RPE to guide intensity level when exercising independently       Knowledge and understanding of Target Heart Rate Range (THRR) Yes       Intervention Provide education and explanation of THRR including how the numbers were predicted and where they are located for reference       Expected Outcomes Short Term: Able to state/look up THRR;Short Term: Able to use daily as  guideline for intensity in rehab;Long Term: Able to use THRR to govern intensity when exercising independently       Understanding of Exercise Prescription Yes       Intervention Provide education, explanation, and written materials on patient's individual exercise prescription       Expected Outcomes Short Term: Able to explain program exercise prescription;Long Term: Able to explain home exercise prescription to exercise independently                Exercise Goals Re-Evaluation :   Discharge Exercise Prescription (Final Exercise Prescription Changes):   Nutrition:  Target Goals: Understanding of nutrition guidelines, daily intake of sodium 1500mg , cholesterol 200mg , calories 30% from fat and 7% or less from saturated fats, daily to have 5 or more servings of fruits and vegetables.  Biometrics:  Pre Biometrics - 10/09/22 1523       Pre Biometrics   Waist Circumference 39 inches    Hip Circumference 40.5 inches    Waist to Hip Ratio 0.96 %    Triceps Skinfold 25 mm    % Body Fat 29.6 %    Grip Strength 35 kg    Flexibility 15.75 in    Single Leg Stand 30 seconds              Nutrition Therapy Plan and Nutrition Goals:   Nutrition Assessments:  MEDIFICTS Score Key: >=70 Need to make dietary changes  40-70 Heart Healthy Diet <= 40 Therapeutic Level Cholesterol Diet    Picture Your Plate Scores: <16 Unhealthy dietary pattern with much room for improvement. 41-50 Dietary pattern unlikely to meet recommendations for good health and room for improvement. 51-60 More healthful dietary pattern, with some room for improvement.  >60 Healthy dietary pattern, although there may be some specific behaviors that could be improved.    Nutrition Goals Re-Evaluation:   Nutrition Goals Re-Evaluation:   Nutrition Goals Discharge (Final Nutrition Goals Re-Evaluation):   Psychosocial: Target Goals: Acknowledge presence or absence of significant depression and/or  stress, maximize coping skills, provide positive support system. Participant is able to verbalize types and ability to use techniques and skills needed for reducing stress and depression.  Initial Review & Psychosocial Screening:  Initial Psych Review & Screening - 10/09/22 1323  Initial Review   Current issues with Current Depression;History of Depression;Current Stress Concerns    Source of Stress Concerns Financial    Comments Stess mostly related to finances      Family Dynamics   Good Support System? Yes   child and wife     Barriers   Psychosocial barriers to participate in program The patient should benefit from training in stress management and relaxation.      Screening Interventions   Interventions Encouraged to exercise;To provide support and resources with identified psychosocial needs;Provide feedback about the scores to participant    Expected Outcomes Long Term goal: The participant improves quality of Life and PHQ9 Scores as seen by post scores and/or verbalization of changes;Short Term goal: Identification and review with participant of any Quality of Life or Depression concerns found by scoring the questionnaire.             Quality of Life Scores:  Quality of Life - 10/09/22 1538       Quality of Life   Select Quality of Life      Quality of Life Scores   Health/Function Pre 9.53 %    Socioeconomic Pre 13.93 %    Psych/Spiritual Pre 5.79 %    Family Pre 23.7 %    GLOBAL Pre 11.75 %            Scores of 19 and below usually indicate a poorer quality of life in these areas.  A difference of  2-3 points is a clinically meaningful difference.  A difference of 2-3 points in the total score of the Quality of Life Index has been associated with significant improvement in overall quality of life, self-image, physical symptoms, and general health in studies assessing change in quality of life.  PHQ-9: Review Flowsheet  More data exists      10/09/2022  08/15/2022 06/06/2022 03/26/2022 10/28/2021  Depression screen PHQ 2/9  Decreased Interest 1      Down, Depressed, Hopeless 1      PHQ - 2 Score 2      Altered sleeping 0      Tired, decreased energy 2      Change in appetite 0      Feeling bad or failure about yourself  1      Trouble concentrating 1      Moving slowly or fidgety/restless 1      Suicidal thoughts 0      PHQ-9 Score 7      Difficult doing work/chores Somewhat difficult        Details       Information is confidential and restricted. Go to Review Flowsheets to unlock data.        Interpretation of Total Score  Total Score Depression Severity:  1-4 = Minimal depression, 5-9 = Mild depression, 10-14 = Moderate depression, 15-19 = Moderately severe depression, 20-27 = Severe depression   Psychosocial Evaluation and Intervention:   Psychosocial Re-Evaluation:   Psychosocial Discharge (Final Psychosocial Re-Evaluation):   Vocational Rehabilitation: Provide vocational rehab assistance to qualifying candidates.   Vocational Rehab Evaluation & Intervention:  Vocational Rehab - 10/09/22 1541       Initial Vocational Rehab Evaluation & Intervention   Assessment shows need for Vocational Rehabilitation No   No needs at this time. He will let us know if he needs resources in the future            Education: Education Goals: Education classes will be provided on  a weekly basis, covering required topics. Participant will state understanding/return demonstration of topics presented.     Core Videos: Exercise    Move It!  Clinical staff conducted group or individual video education with verbal and written material and guidebook.  Patient learns the recommended Pritikin exercise program. Exercise with the goal of living a long, healthy life. Some of the health benefits of exercise include controlled diabetes, healthier blood pressure levels, improved cholesterol levels, improved heart and lung capacity,  improved sleep, and better body composition. Everyone should speak with their doctor before starting or changing an exercise routine.  Biomechanical Limitations Clinical staff conducted group or individual video education with verbal and written material and guidebook.  Patient learns how biomechanical limitations can impact exercise and how we can mitigate and possibly overcome limitations to have an impactful and balanced exercise routine.  Body Composition Clinical staff conducted group or individual video education with verbal and written material and guidebook.  Patient learns that body composition (ratio of muscle mass to fat mass) is a key component to assessing overall fitness, rather than body weight alone. Increased fat mass, especially visceral belly fat, can put [place] at increased risk for metabolic syndrome, type 2 diabetes, heart disease, and even death. It is recommended to combine diet and exercise (cardiovascular and resistance training) to improve your body composition. Seek guidance from your physician and exercise physiologist before implementing an exercise routine.  Exercise Action Plan Clinical staff conducted group or individual video education with verbal and written material and guidebook.  Patient learns the recommended strategies to achieve and enjoy long-term exercise adherence, including variety, self-motivation, self-efficacy, and positive decision making. Benefits of exercise include fitness, good health, weight management, more energy, better sleep, less stress, and overall well-being.  Medical   Heart Disease Risk Reduction Clinical staff conducted group or individual video education with verbal and written material and guidebook.  Patient learns our heart is our most vital organ as it circulates oxygen, nutrients, white blood cells, and hormones throughout the entire body, and carries waste away. Data supports a plant-based eating plan like the Pritikin Program for  its effectiveness in slowing progression of and reversing heart disease. The video provides a number of recommendations to address heart disease.   Metabolic Syndrome and Belly Fat  Clinical staff conducted group or individual video education with verbal and written material and guidebook.  Patient learns what metabolic syndrome is, how it leads to heart disease, and how one can reverse it and keep it from coming back. You have metabolic syndrome if you have 3 of the following 5 criteria: abdominal obesity, high blood pressure, high triglycerides, low HDL cholesterol, and high blood sugar.  Hypertension and Heart Disease Clinical staff conducted group or individual video education with verbal and written material and guidebook.  Patient learns that high blood pressure, or hypertension, is very common in the [place]. Hypertension is largely due to excessive salt intake, but other important risk factors include being overweight, physical inactivity, drinking too much alcohol, smoking, and not eating enough potassium from fruits and vegetables. High blood pressure is a leading risk factor for heart attack, stroke, congestive heart failure, dementia, kidney failure, and premature death. Long-term effects of excessive salt intake include stiffening of the arteries and thickening of heart muscle and organ damage. Recommendations include ways to reduce hypertension and the risk of heart disease.  Diseases of Our Time - Focusing on Diabetes Clinical staff conducted group or individual video education with verbal  and written material and guidebook.  Patient learns why the best way to stop diseases of our time is prevention, through food and other lifestyle changes. Medicine (such as prescription pills and surgeries) is often only a Band-Aid on the problem, not a long-term solution. Most common diseases of our time include obesity, type 2 diabetes, hypertension, heart disease, and cancer. The Pritikin  Program is recommended and has been proven to help reduce, reverse, and/or prevent the damaging effects of metabolic syndrome.  Nutrition   Overview of the Pritikin Eating Plan  Clinical staff conducted group or individual video education with verbal and written material and guidebook.  Patient learns about the Pritikin Eating Plan for disease risk reduction. The Pritikin Eating Plan emphasizes a wide variety of unrefined, minimally-processed carbohydrates, like fruits, vegetables, whole grains, and legumes. Go, Caution, and Stop food choices are explained. Plant-based and lean animal proteins are emphasized. Rationale provided for low sodium intake for blood pressure control, low added sugars for blood sugar stabilization, and low added fats and oils for coronary artery disease risk reduction and weight management.  Calorie Density  Clinical staff conducted group or individual video education with verbal and written material and guidebook.  Patient learns about calorie density and how it impacts the Pritikin Eating Plan. Knowing the characteristics of the food you choose will help you decide whether those foods will lead to weight gain or weight loss, and whether you want to consume more or less of them. Weight loss is usually a side effect of the Pritikin Eating Plan because of its focus on low calorie-dense foods.  Label Reading  Clinical staff conducted group or individual video education with verbal and written material and guidebook.  Patient learns about the Pritikin recommended label reading guidelines and corresponding recommendations regarding calorie density, added sugars, sodium content, and whole grains.  Dining Out - Part 1  Clinical staff conducted group or individual video education with verbal and written material and guidebook.  Patient learns that restaurant meals can be sabotaging because they can be so high in calories, fat, sodium, and/or sugar. Patient learns recommended  strategies on how to positively address this and avoid unhealthy pitfalls.  Facts on Fats  Clinical staff conducted group or individual video education with verbal and written material and guidebook.  Patient learns that lifestyle modifications can be just as effective, if not more so, as many medications for lowering your risk of heart disease. A Pritikin lifestyle can help to reduce your risk of inflammation and atherosclerosis (cholesterol build-up, or plaque, in the artery walls). Lifestyle interventions such as dietary choices and physical activity address the cause of atherosclerosis. A review of the types of fats and their impact on blood cholesterol levels, along with dietary recommendations to reduce fat intake is also included.  Nutrition Action Plan  Clinical staff conducted group or individual video education with verbal and written material and guidebook.  Patient learns how to incorporate Pritikin recommendations into their lifestyle. Recommendations include planning and keeping personal health goals in mind as an important part of their success.  Healthy Mind-Set    Healthy Minds, Bodies, Hearts  Clinical staff conducted group or individual video education with verbal and written material and guidebook.  Patient learns how to identify when they are stressed. Video will discuss the impact of that stress, as well as the many benefits of stress management. Patient will also be introduced to stress management techniques. The way we think, act, and feel has an impact on  our hearts.  How Our Thoughts Can Heal Our Hearts  Clinical staff conducted group or individual video education with verbal and written material and guidebook.  Patient learns that negative thoughts can cause depression and anxiety. This can result in negative lifestyle behavior and serious health problems. Cognitive behavioral therapy is an effective method to help control our thoughts in order to change and improve our  emotional outlook.  Additional Videos:  Exercise    Improving Performance  Clinical staff conducted group or individual video education with verbal and written material and guidebook.  Patient learns to use a non-linear approach by alternating intensity levels and lengths of time spent exercising to help burn more calories and lose more body fat. Cardiovascular exercise helps improve heart health, metabolism, hormonal balance, blood sugar control, and recovery from fatigue. Resistance training improves strength, endurance, balance, coordination, reaction time, metabolism, and muscle mass. Flexibility exercise improves circulation, posture, and balance. Seek guidance from your physician and exercise physiologist before implementing an exercise routine and learn your capabilities and proper form for all exercise.  Introduction to Yoga  Clinical staff conducted group or individual video education with verbal and written material and guidebook.  Patient learns about yoga, a discipline of the coming together of mind, breath, and body. The benefits of yoga include improved flexibility, improved range of motion, better posture and core strength, increased lung function, weight loss, and positive self-image. Yoga's heart health benefits include lowered blood pressure, healthier heart rate, decreased cholesterol and triglyceride levels, improved immune function, and reduced stress. Seek guidance from your physician and exercise physiologist before implementing an exercise routine and learn your capabilities and proper form for all exercise.  Medical   Aging: Enhancing Your Quality of Life  Clinical staff conducted group or individual video education with verbal and written material and guidebook.  Patient learns key strategies and recommendations to stay in good physical health and enhance quality of life, such as prevention strategies, having an advocate, securing a Health Care Proxy and Power of Attorney,  and keeping a list of medications and system for tracking them. It also discusses how to avoid risk for bone loss.  Biology of Weight Control  Clinical staff conducted group or individual video education with verbal and written material and guidebook.  Patient learns that weight gain occurs because we consume more calories than we burn (eating more, moving less). Even if your body weight is normal, you may have higher ratios of fat compared to muscle mass. Too much body fat puts you at increased risk for cardiovascular disease, heart attack, stroke, type 2 diabetes, and obesity-related cancers. In addition to exercise, following the Pritikin Eating Plan can help reduce your risk.  Decoding Lab Results  Clinical staff conducted group or individual video education with verbal and written material and guidebook.  Patient learns that lab test reflects one measurement whose values change over time and are influenced by many factors, including medication, stress, sleep, exercise, food, hydration, pre-existing medical conditions, and more. It is recommended to use the knowledge from this video to become more involved with your lab results and evaluate your numbers to speak with your doctor.   Diseases of Our Time - Overview  Clinical staff conducted group or individual video education with verbal and written material and guidebook.  Patient learns that according to the CDC, 50% to 70% of chronic diseases (such as obesity, type 2 diabetes, elevated lipids, hypertension, and heart disease) are avoidable through lifestyle improvements including healthier food  choices, listening to satiety cues, and increased physical activity.  Sleep Disorders Clinical staff conducted group or individual video education with verbal and written material and guidebook.  Patient learns how good quality and duration of sleep are important to overall health and well-being. Patient also learns about sleep disorders and how they  impact health along with recommendations to address them, including discussing with a physician.  Nutrition  Dining Out - Part 2 Clinical staff conducted group or individual video education with verbal and written material and guidebook.  Patient learns how to plan ahead and communicate in order to maximize their dining experience in a healthy and nutritious manner. Included are recommended food choices based on the type of restaurant the patient is visiting.   Fueling a Banker conducted group or individual video education with verbal and written material and guidebook.  There is a strong connection between our food choices and our health. Diseases like obesity and type 2 diabetes are very prevalent and are in large-part due to lifestyle choices. The Pritikin Eating Plan provides plenty of food and hunger-curbing satisfaction. It is easy to follow, affordable, and helps reduce health risks.  Menu Workshop  Clinical staff conducted group or individual video education with verbal and written material and guidebook.  Patient learns that restaurant meals can sabotage health goals because they are often packed with calories, fat, sodium, and sugar. Recommendations include strategies to plan ahead and to communicate with the manager, chef, or server to help order a healthier meal.  Planning Your Eating Strategy  Clinical staff conducted group or individual video education with verbal and written material and guidebook.  Patient learns about the Pritikin Eating Plan and its benefit of reducing the risk of disease. The Pritikin Eating Plan does not focus on calories. Instead, it emphasizes high-quality, nutrient-rich foods. By knowing the characteristics of the foods, we choose, we can determine their calorie density and make informed decisions.  Targeting Your Nutrition Priorities  Clinical staff conducted group or individual video education with verbal and written material and  guidebook.  Patient learns that lifestyle habits have a tremendous impact on disease risk and progression. This video provides eating and physical activity recommendations based on your personal health goals, such as reducing LDL cholesterol, losing weight, preventing or controlling type 2 diabetes, and reducing high blood pressure.  Vitamins and Minerals  Clinical staff conducted group or individual video education with verbal and written material and guidebook.  Patient learns different ways to obtain key vitamins and minerals, including through a recommended healthy diet. It is important to discuss all supplements you take with your doctor.   Healthy Mind-Set    Smoking Cessation  Clinical staff conducted group or individual video education with verbal and written material and guidebook.  Patient learns that cigarette smoking and tobacco addiction pose a serious health risk which affects millions of people. Stopping smoking will significantly reduce the risk of heart disease, lung disease, and many forms of cancer. Recommended strategies for quitting are covered, including working with your doctor to develop a successful plan.  Culinary   Becoming a Set designer conducted group or individual video education with verbal and written material and guidebook.  Patient learns that cooking at home can be healthy, cost-effective, quick, and puts them in control. Keys to cooking healthy recipes will include looking at your recipe, assessing your equipment needs, planning ahead, making it simple, choosing cost-effective seasonal ingredients, and limiting the use of  added fats, salts, and sugars.  Cooking - Breakfast and Snacks  Clinical staff conducted group or individual video education with verbal and written material and guidebook.  Patient learns how important breakfast is to satiety and nutrition through the entire day. Recommendations include key foods to eat during breakfast to  help stabilize blood sugar levels and to prevent overeating at meals later in the day. Planning ahead is also a key component.  Cooking - Educational psychologist conducted group or individual video education with verbal and written material and guidebook.  Patient learns eating strategies to improve overall health, including an approach to cook more at home. Recommendations include thinking of animal protein as a side on your plate rather than center stage and focusing instead on lower calorie dense options like vegetables, fruits, whole grains, and plant-based proteins, such as beans. Making sauces in large quantities to freeze for later and leaving the skin on your vegetables are also recommended to maximize your experience.  Cooking - Healthy Salads and Dressing Clinical staff conducted group or individual video education with verbal and written material and guidebook.  Patient learns that vegetables, fruits, whole grains, and legumes are the foundations of the Pritikin Eating Plan. Recommendations include how to incorporate each of these in flavorful and healthy salads, and how to create homemade salad dressings. Proper handling of ingredients is also covered. Cooking - Soups and [employer] - Soups and Desserts Clinical staff conducted group or individual video education with verbal and written material and guidebook.  Patient learns that Pritikin soups and desserts make for easy, nutritious, and delicious snacks and meal components that are low in sodium, fat, sugar, and calorie density, while high in vitamins, minerals, and filling fiber. Recommendations include simple and healthy ideas for soups and desserts.   Overview     The Pritikin Solution Program Overview Clinical staff conducted group or individual video education with verbal and written material and guidebook.  Patient learns that the results of the Pritikin Program have been documented in more than 100 articles  published in peer-reviewed journals, and the benefits include reducing risk factors for (and, in some cases, even reversing) high cholesterol, high blood pressure, type 2 diabetes, obesity, and more! An overview of the three key pillars of the Pritikin Program will be covered: eating well, doing regular exercise, and having a healthy mind-set.  WORKSHOPS  Exercise: Exercise Basics: Building Your Action Plan Clinical staff led group instruction and group discussion with PowerPoint presentation and patient guidebook. To enhance the learning environment the use of posters, models and videos may be added. At the conclusion of this workshop, patients will comprehend the difference between physical activity and exercise, as well as the benefits of incorporating both, into their routine. Patients will understand the FITT (Frequency, Intensity, Time, and Type) principle and how to use it to build an exercise action plan. In addition, safety concerns and other considerations for exercise and cardiac rehab will be addressed by the presenter. The purpose of this lesson is to promote a comprehensive and effective weekly exercise routine in order to improve patients' overall level of fitness.   Managing Heart Disease: Your Path to a Healthier Heart Clinical staff led group instruction and group discussion with PowerPoint presentation and patient guidebook. To enhance the learning environment the use of posters, models and videos may be added.At the conclusion of this workshop, patients will understand the anatomy and physiology of the heart. Additionally, they will understand how Pritikin's  three pillars impact the risk factors, the progression, and the management of heart disease.  The purpose of this lesson is to provide a high-level overview of the heart, heart disease, and how the Pritikin lifestyle positively impacts risk factors.  Exercise Biomechanics Clinical staff led group instruction and group  discussion with PowerPoint presentation and patient guidebook. To enhance the learning environment the use of posters, models and videos may be added. Patients will learn how the structural parts of their bodies function and how these functions impact their daily activities, movement, and exercise. Patients will learn how to promote a neutral spine, learn how to manage pain, and identify ways to improve their physical movement in order to promote healthy living. The purpose of this lesson is to expose patients to common physical limitations that impact physical activity. Participants will learn practical ways to adapt and manage aches and pains, and to minimize their effect on regular exercise. Patients will learn how to maintain good posture while sitting, walking, and lifting.  Balance Training and Fall Prevention  Clinical staff led group instruction and group discussion with PowerPoint presentation and patient guidebook. To enhance the learning environment the use of posters, models and videos may be added. At the conclusion of this workshop, patients will understand the importance of their sensorimotor skills (vision, proprioception, and the vestibular system) in maintaining their ability to balance as they age. Patients will apply a variety of balancing exercises that are appropriate for their current level of function. Patients will understand the common causes for poor balance, possible solutions to these problems, and ways to modify their physical environment in order to minimize their fall risk. The purpose of this lesson is to teach patients about the importance of maintaining balance as they age and ways to minimize their risk of falling.  WORKSHOPS   Nutrition:  Fueling a Ship broker led group instruction and group discussion with PowerPoint presentation and patient guidebook. To enhance the learning environment the use of posters, models and videos may be added.  Patients will review the foundational principles of the Pritikin Eating Plan and understand what constitutes a serving size in each of the food groups. Patients will also learn Pritikin-friendly foods that are better choices when away from home and review make-ahead meal and snack options. Calorie density will be reviewed and applied to three nutrition priorities: weight maintenance, weight loss, and weight gain. The purpose of this lesson is to reinforce (in a group setting) the key concepts around what patients are recommended to eat and how to apply these guidelines when away from home by planning and selecting Pritikin-friendly options. Patients will understand how calorie density may be adjusted for different weight management goals.  Mindful Eating  Clinical staff led group instruction and group discussion with PowerPoint presentation and patient guidebook. To enhance the learning environment the use of posters, models and videos may be added. Patients will briefly review the concepts of the Pritikin Eating Plan and the importance of low-calorie dense foods. The concept of mindful eating will be introduced as well as the importance of paying attention to internal hunger signals. Triggers for non-hunger eating and techniques for dealing with triggers will be explored. The purpose of this lesson is to provide patients with the opportunity to review the basic principles of the Pritikin Eating Plan, discuss the value of eating mindfully and how to measure internal cues of hunger and fullness using the Hunger Scale. Patients will also discuss reasons for non-hunger eating and  learn strategies to use for controlling emotional eating.  Targeting Your Nutrition Priorities Clinical staff led group instruction and group discussion with PowerPoint presentation and patient guidebook. To enhance the learning environment the use of posters, models and videos may be added. Patients will learn how to determine their  genetic susceptibility to disease by reviewing their family history. Patients will gain insight into the importance of diet as part of an overall healthy lifestyle in mitigating the impact of genetics and other environmental insults. The purpose of this lesson is to provide patients with the opportunity to assess their personal nutrition priorities by looking at their family history, their own health history and current risk factors. Patients will also be able to discuss ways of prioritizing and modifying the Pritikin Eating Plan for their highest risk areas  Menu  Clinical staff led group instruction and group discussion with PowerPoint presentation and patient guidebook. To enhance the learning environment the use of posters, models and videos may be added. Using menus brought in from [employer], or printed from [employer], patients will apply the Pritikin dining out guidelines that were presented in the [employer] video. Patients will also be able to practice these guidelines in a variety of provided scenarios. The purpose of this lesson is to provide patients with the opportunity to practice hands-on learning of the Pritikin Dining Out guidelines with actual menus and practice scenarios.  Label Reading Clinical staff led group instruction and group discussion with PowerPoint presentation and patient guidebook. To enhance the learning environment the use of posters, models and videos may be added. Patients will review and discuss the Pritikin label reading guidelines presented in Pritikin's Label Reading Educational series video. Using fool labels brought in from local grocery stores and markets, patients will apply the label reading guidelines and determine if the packaged food meet the Pritikin guidelines. The purpose of this lesson is to provide patients with the opportunity to review, discuss, and practice hands-on learning of the Pritikin Label Reading guidelines with  actual packaged food labels. Cooking School  Pritikin's [employer] are designed to teach patients ways to prepare quick, simple, and affordable recipes at home. The importance of nutrition's role in chronic disease risk reduction is reflected in its emphasis in the overall Pritikin program. By learning how to prepare essential core Pritikin Eating Plan recipes, patients will increase control over what they eat; be able to customize the flavor of foods without the use of added salt, sugar, or fat; and improve the quality of the food they consume. By learning a set of core recipes which are easily assembled, quickly prepared, and affordable, patients are more likely to prepare more healthy foods at home. These workshops focus on convenient breakfasts, simple entres, side dishes, and desserts which can be prepared with minimal effort and are consistent with nutrition recommendations for cardiovascular risk reduction. Cooking [employer] are taught by a Armed forces logistics/support/administrative officer (RD) who has been trained by the [employer]. The chef or RD has a clear understanding of the importance of minimizing - if not completely eliminating - added fat, sugar, and sodium in recipes. Throughout the series of Cooking School Workshop sessions, patients will learn about healthy ingredients and efficient methods of cooking to build confidence in their capability to prepare    Cooking School weekly topics:  Adding Flavor- Sodium-Free  Fast and Healthy Breakfasts  Powerhouse Plant-Based Proteins  Satisfying Salads and Dressings  Simple Sides and Sauces  International Cuisine-Spotlight on the [employer] Zones  Delicious Desserts  Savory Soups  [employer] - Meals in a Astronomer Appetizers and Snacks  Comforting Weekend Breakfasts  One-Pot Wonders   Fast [employer] Your Pritikin Plate  WORKSHOPS   Healthy Mindset (Psychosocial):  Focused  Goals, Sustainable Changes Clinical staff led group instruction and group discussion with PowerPoint presentation and patient guidebook. To enhance the learning environment the use of posters, models and videos may be added. Patients will be able to apply effective goal setting strategies to establish at least one personal goal, and then take consistent, meaningful action toward that goal. They will learn to identify common barriers to achieving personal goals and develop strategies to overcome them. Patients will also gain an understanding of how our mind-set can impact our ability to achieve goals and the importance of cultivating a positive and growth-oriented mind-set. The purpose of this lesson is to provide patients with a deeper understanding of how to set and achieve personal goals, as well as the tools and strategies needed to overcome common obstacles which may arise along the way.  From Head to Heart: The Power of a Healthy Outlook  Clinical staff led group instruction and group discussion with PowerPoint presentation and patient guidebook. To enhance the learning environment the use of posters, models and videos may be added. Patients will be able to recognize and describe the impact of emotions and mood on physical health. They will discover the importance of self-care and explore self-care practices which may work for them. Patients will also learn how to utilize the 4 C's to cultivate a healthier outlook and better manage stress and challenges. The purpose of this lesson is to demonstrate to patients how a healthy outlook is an essential part of maintaining good health, especially as they continue their cardiac rehab journey.  Healthy Sleep for a Healthy Heart Clinical staff led group instruction and group discussion with PowerPoint presentation and patient guidebook. To enhance the learning environment the use of posters, models and videos may be added. At the conclusion of this workshop,  patients will be able to demonstrate knowledge of the importance of sleep to overall health, well-being, and quality of life. They will understand the symptoms of, and treatments for, common sleep disorders. Patients will also be able to identify daytime and nighttime behaviors which impact sleep, and they will be able to apply these tools to help manage sleep-related challenges. The purpose of this lesson is to provide patients with a general overview of sleep and outline the importance of quality sleep. Patients will learn about a few of the most common sleep disorders. Patients will also be introduced to the concept of "sleep hygiene," and discover ways to self-manage certain sleeping problems through simple daily behavior changes. Finally, the workshop will motivate patients by clarifying the links between quality sleep and their goals of heart-healthy living.   Recognizing and Reducing Stress Clinical staff led group instruction and group discussion with PowerPoint presentation and patient guidebook. To enhance the learning environment the use of posters, models and videos may be added. At the conclusion of this workshop, patients will be able to understand the types of stress reactions, differentiate between acute and chronic stress, and recognize the impact that chronic stress has on their health. They will also be able to apply different coping mechanisms, such as reframing negative self-talk. Patients will have the opportunity to practice a variety of stress management techniques, such as  deep abdominal breathing, progressive muscle relaxation, and/or guided imagery.  The purpose of this lesson is to educate patients on the role of stress in their lives and to provide healthy techniques for coping with it.  Learning Barriers/Preferences:  Learning Barriers/Preferences - 10/09/22 1539       Learning Barriers/Preferences   Learning Barriers Sight;Exercise Concerns   he wears glasses, history of  falls   Learning Preferences Audio;Computer/Internet;Group Instruction;Individual Instruction;Pictoral;Skilled Demonstration;Verbal Instruction;Video;Written Material             Education Topics:  Knowledge Questionnaire Score:  Knowledge Questionnaire Score - 10/09/22 1523       Knowledge Questionnaire Score   Pre Score 24/24             Core Components/Risk Factors/Patient Goals at Admission:  Personal Goals and Risk Factors at Admission - 10/09/22 1358       Core Components/Risk Factors/Patient Goals on Admission    Weight Management Yes    Intervention Weight Management: Develop a combined nutrition and exercise program designed to reach desired caloric intake, while maintaining appropriate intake of nutrient and fiber, sodium and fats, and appropriate energy expenditure required for the weight goal.;Weight Management: Provide education and appropriate resources to help participant work on and attain dietary goals.    Expected Outcomes Short Term: Continue to assess and modify interventions until short term weight is achieved;Long Term: Adherence to nutrition and physical activity/exercise program aimed toward attainment of established weight goal;Understanding recommendations for meals to include 15-35% energy as protein, 25-35% energy from fat, 35-60% energy from carbohydrates, less than 200mg  of dietary cholesterol, 20-35 gm of total fiber daily;Understanding of distribution of calorie intake throughout the day with the consumption of 4-5 meals/snacks    Heart Failure Yes    Intervention Provide a combined exercise and nutrition program that is supplemented with education, support and counseling about heart failure. Directed toward relieving symptoms such as shortness of breath, decreased exercise tolerance, and extremity edema.    Expected Outcomes Short term: Attendance in program 2-3 days a week with increased exercise capacity. Reported lower sodium intake. Reported  increased fruit and vegetable intake. Reports medication compliance.;Short term: Daily weights obtained and reported for increase. Utilizing diuretic protocols set by physician.;Long term: Adoption of self-care skills and reduction of barriers for early signs and symptoms recognition and intervention leading to self-care maintenance.;Improve functional capacity of life    Hypertension Yes    Intervention Provide education on lifestyle modifcations including regular physical activity/exercise, weight management, moderate sodium restriction and increased consumption of fresh fruit, vegetables, and low fat dairy, alcohol moderation, and smoking cessation.;Monitor prescription use compliance.    Expected Outcomes Short Term: Continued assessment and intervention until BP is < 140/7mm HG in hypertensive participants. < 130/60mm HG in hypertensive participants with diabetes, heart failure or chronic kidney disease.;Long Term: Maintenance of blood pressure at goal levels.    Lipids Yes    Intervention Provide education and support for participant on nutrition & aerobic/resistive exercise along with prescribed medications to achieve LDL 70mg , HDL >40mg .    Expected Outcomes Short Term: Participant states understanding of desired cholesterol values and is compliant with medications prescribed. Participant is following exercise prescription and nutrition guidelines.;Long Term: Cholesterol controlled with medications as prescribed, with individualized exercise RX and with personalized nutrition plan. Value goals: LDL < 70mg , HDL > 40 mg.    Stress Yes    Intervention Offer individual and/or small group education and counseling on adjustment to heart disease, stress management  and health-related lifestyle change. Teach and support self-help strategies.;Refer participants experiencing significant psychosocial distress to appropriate mental health specialists for further evaluation and treatment. When possible, include  family members and significant others in education/counseling sessions.    Expected Outcomes Short Term: Participant demonstrates changes in health-related behavior, relaxation and other stress management skills, ability to obtain effective social support, and compliance with psychotropic medications if prescribed.;Long Term: Emotional wellbeing is indicated by absence of clinically significant psychosocial distress or social isolation.             Core Components/Risk Factors/Patient Goals Review:    Core Components/Risk Factors/Patient Goals at Discharge (Final Review):    ITP Comments:  ITP Comments     Row Name 10/09/22 1357           ITP Comments Dr. Armanda Magic medical director. Introduction to [facility] education/ intensive cardiac rehab. Initial orientation packet reviewed with patient.                Comments: Participant attended orientation for the cardiac rehabilitation program on  10/09/2022  to perform initial intake and exercise walk test. Patient introduced to the [facility] education and orientation packet was reviewed. Completed 6-minute walk test, measurements, initial ITP, and exercise prescription. Vital signs stable. Telemetry-normal sinus rhythm with inverted QRS, asymptomatic.   Service time was from 1312 to 1500.

## 2022-10-09 NOTE — Progress Notes (Addendum)
Cardiac Rehab Medication Review   Does the patient  feel that his/her medications are working for him/her?  yes  Has the patient been experiencing any side effects to the medications prescribed?  No  Does the patient measure his/her own blood pressure or blood glucose at home?  no   Does the patient have any problems obtaining medications due to transportation or finances?   no  Understanding of regimen: excellent Understanding of indications: excellent Potential of compliance: excellent    Comments: He is good with his medication routine. He occasionally checks BP and does have a monitor at home.      Craig Flores 10/09/2022 2:38 PM

## 2022-10-10 ENCOUNTER — Other Ambulatory Visit: Payer: Self-pay

## 2022-10-13 ENCOUNTER — Encounter (HOSPITAL_COMMUNITY)
Admission: RE | Admit: 2022-10-13 | Discharge: 2022-10-13 | Disposition: A | Discharge status: Home / Self Care | Payer: Medicaid Other | Source: Ambulatory Visit | Attending: Cardiology

## 2022-10-13 ENCOUNTER — Other Ambulatory Visit: Payer: Self-pay

## 2022-10-13 DIAGNOSIS — I429 Cardiomyopathy, unspecified: Secondary | ICD-10-CM

## 2022-10-13 DIAGNOSIS — I10 Essential (primary) hypertension: Secondary | ICD-10-CM | POA: Diagnosis not present

## 2022-10-13 NOTE — Progress Notes (Signed)
Daily Session Note  Patient Details  Name: Craig Flores MRN: [id number] Date of Birth: [birth year] Referring Provider:   Flowsheet Row CARDIAC REHAB PHASE II ORIENTATION from 10/09/2022 in [facility]  Referring Provider Dr. Marca Ancona, MD       Encounter Date: 10/13/2022  Check In:  Session Check In - 10/13/22 1053       Check-In   Supervising physician immediately available to respond to emergencies [facility] - Physician supervision    Physician(s) Jari Favre, PA    Location [facility]    Staff Present Hughie Closs BS, ACSM-CEP, Exercise Physiologist;Giorgi Debruin, RN, Zachery Conch, MS, ACSM-CEP, Exercise Physiologist;David Manus Gunning, MS, ACSM-CEP, CCRP, Exercise Physiologist;Mary Gerre Scull, RN, BSN;Johnny Porter, MS, Exercise Physiologist;Bailey Wallace Cullens, MS, Exercise Physiologist;Other    Virtual Visit No    Medication changes reported     No    Fall or balance concerns reported    No    Tobacco Cessation No Change    Warm-up and Cool-down Performed as group-led instruction    Resistance Training Performed Yes    VAD Patient? No    PAD/SET Patient? No      Pain Assessment   Currently in Pain? No/denies    Pain Score 0-No pain    Multiple Pain Sites No             Capillary Blood Glucose: No results found for this or any previous visit (from the past 24 hour(s)).   Exercise Prescription Changes - 10/13/22 1036       Response to Exercise   Blood Pressure (Admit) 100/62    Blood Pressure (Exercise) 140/68    Blood Pressure (Exit) 102/78    Heart Rate (Admit) 77 bpm    Heart Rate (Exercise) 102 bpm    Heart Rate (Exit) 79 bpm    Rating of Perceived Exertion (Exercise) 11    Symptoms None    Comments Off to a great start with exercise.    Duration Continue with 30 min of aerobic exercise without signs/symptoms of physical distress.    Intensity THRR unchanged      Progression    Progression Continue to progress workloads to maintain intensity without signs/symptoms of physical distress.    Average METs 3.5      Resistance Training   Training Prescription Yes   cuff weights   Weight 3    Reps 10-15    Time 10 Minutes      Interval Training   Interval Training No      Treadmill   MPH 2.8    Grade 1    Minutes 16    METs 3.53      Bike   Level 4    Watts 42    Minutes 19    METs 3.4             Social History   Tobacco Use  Smoking Status Former   Current packs/day: 0.00   Types: Cigarettes   Quit date: 02/15/2022   Years since quitting: 0.6  Smokeless Tobacco Never  Tobacco Comments   x15 yrs as of 2021    Goals Met:  Exercise tolerated well No report of concerns or symptoms today Strength training completed today  Goals Unmet:  Not Applicable  Comments: Pt started cardiac rehab today.  Pt tolerated light exercise without difficulty. VSS, telemetry-Sinus Rhythm, bundle branch, block asymptomatic.  Medication list reconciled. Pt denies barriers  to medicaiton compliance.  PSYCHOSOCIAL ASSESSMENT:  Will review quality of life and PHQ2-9 and quality of life survey  in the upcoming week.     Pt enjoys enjoys, writing, spending time with family and cooking..   Pt oriented to exercise equipment and routine.    Understanding verbalized.Thayer Headings RN BSN    Dr. Armanda Magic is Medical Director for Cardiac Rehab at [facility].

## 2022-10-14 ENCOUNTER — Other Ambulatory Visit: Payer: Self-pay

## 2022-10-15 ENCOUNTER — Encounter (HOSPITAL_COMMUNITY)
Admission: RE | Admit: 2022-10-15 | Discharge: 2022-10-15 | Disposition: A | Discharge status: Home / Self Care | Payer: Medicaid Other | Source: Ambulatory Visit | Attending: Cardiology

## 2022-10-15 DIAGNOSIS — I429 Cardiomyopathy, unspecified: Secondary | ICD-10-CM | POA: Diagnosis not present

## 2022-10-15 DIAGNOSIS — I10 Essential (primary) hypertension: Secondary | ICD-10-CM | POA: Diagnosis not present

## 2022-10-17 ENCOUNTER — Encounter (HOSPITAL_COMMUNITY)
Admission: RE | Admit: 2022-10-17 | Discharge: 2022-10-17 | Disposition: A | Discharge status: Home / Self Care | Payer: Medicaid Other | Source: Ambulatory Visit | Attending: Cardiology

## 2022-10-17 DIAGNOSIS — I429 Cardiomyopathy, unspecified: Secondary | ICD-10-CM

## 2022-10-17 DIAGNOSIS — I10 Essential (primary) hypertension: Secondary | ICD-10-CM | POA: Diagnosis not present

## 2022-10-20 ENCOUNTER — Other Ambulatory Visit (HOSPITAL_COMMUNITY): Payer: Self-pay | Admitting: Cardiology

## 2022-10-20 ENCOUNTER — Other Ambulatory Visit: Payer: Self-pay

## 2022-10-20 ENCOUNTER — Encounter (HOSPITAL_COMMUNITY)
Admission: RE | Admit: 2022-10-20 | Discharge: 2022-10-20 | Disposition: A | Discharge status: Home / Self Care | Payer: Medicaid Other | Source: Ambulatory Visit | Attending: Cardiology

## 2022-10-20 DIAGNOSIS — I10 Essential (primary) hypertension: Secondary | ICD-10-CM | POA: Diagnosis not present

## 2022-10-20 DIAGNOSIS — I429 Cardiomyopathy, unspecified: Secondary | ICD-10-CM

## 2022-10-21 ENCOUNTER — Other Ambulatory Visit: Payer: Self-pay

## 2022-10-21 MED ORDER — FENOFIBRATE 160 MG PO TABS
160.0000 mg | ORAL_TABLET | Freq: Every day | ORAL | 3 refills | Status: DC
  Filled 2022-10-21: qty 30, 30d supply, fill #0
  Filled 2022-11-16: qty 30, 30d supply, fill #1
  Filled 2022-12-20: qty 30, 30d supply, fill #2
  Filled 2023-01-17: qty 30, 30d supply, fill #3

## 2022-10-21 NOTE — Progress Notes (Signed)
Cardiac Individual Treatment Plan  Patient Details  Name: Craig Flores MRN: [id number] Date of Birth: May 16, 1965 Referring Provider:   Flowsheet Row CARDIAC REHAB PHASE II ORIENTATION from 10/09/2022 in [facility]  Referring Provider Dr. Marca Ancona, MD       Initial Encounter Date:  Flowsheet Row CARDIAC REHAB PHASE II ORIENTATION from 10/09/2022 in [facility] for [facility], [facility]  Date 10/09/22       Visit Diagnosis: Cardiomyopathy, unspecified type (HCC)  Patient's Home Medications on Admission:  Current Outpatient Medications:    acetaminophen (TYLENOL) 500 MG tablet, Take 500 mg by mouth every 6 (six) hours as needed for headache., Disp: , Rfl:    aspirin 81 MG chewable tablet, Chew 1 tablet (81 mg total) by mouth daily., Disp: 90 tablet, Rfl: 3   atorvastatin (LIPITOR) 80 MG tablet, Take 1 tablet (80 mg total) by mouth daily., Disp: 30 tablet, Rfl: 11   carvedilol (COREG) 6.25 MG tablet, Take 1 tablet (6.25 mg total) by mouth 2 (two) times daily., Disp: 60 tablet, Rfl: 6   dapagliflozin propanediol (FARXIGA) 10 MG TABS tablet, Take 1 tablet (10 mg total) by mouth daily before breakfast., Disp: 30 tablet, Rfl: 11   digoxin (LANOXIN) 0.125 MG tablet, Take 1/2 tablet (0.0625 mg total) by mouth daily., Disp: 30 tablet, Rfl: 6   ezetimibe (ZETIA) 10 MG tablet, Take 1 tablet (10 mg total) by mouth daily., Disp: 90 tablet, Rfl: 3   fenofibrate 160 MG tablet, Take 1 tablet (160 mg total) by mouth daily., Disp: 30 tablet, Rfl: 3   furosemide (LASIX) 20 MG tablet, Take 1 tablet (20 mg total) by mouth daily., Disp: 30 tablet, Rfl: 6   furosemide (LASIX) 20 MG tablet, Take 1 tablet (20 mg total) by mouth daily as needed for fluid or edema., Disp: 30 tablet, Rfl: 11   hydrOXYzine (ATARAX) 25 MG tablet, Take 1 tablet (25 mg total) by mouth 3 (three) times daily as needed., Disp: 90 tablet,  Rfl: 3   melatonin 5 MG TABS, Take 2 tablets (10 mg total) by mouth at bedtime., Disp: 30 tablet, Rfl: 3   mirtazapine (REMERON) 45 MG tablet, Take 1 tablet (45 mg total) by mouth at bedtime., Disp: 30 tablet, Rfl: 3   Multiple Vitamin (MULTIVITAMIN WITH MINERALS) TABS tablet, Take 1 tablet by mouth daily., Disp: , Rfl:    omega-3 acid ethyl esters (LOVAZA) 1 g capsule, TAKE 2 CAPSULES (2 GRAMS TOTAL) BY MOUTH TWO TIMES DAILY., Disp: 120 capsule, Rfl: 3   omeprazole (PRILOSEC) 20 MG capsule, Take 20 mg by mouth daily before breakfast., Disp: , Rfl:    Probiotic Product (PROBIOTIC DAILY PO), Take 1 capsule by mouth with breakfast, with lunch, and with evening meal., Disp: , Rfl:    sacubitril-valsartan (ENTRESTO) 97-103 MG, Take 1 tablet by mouth 2 (two) times daily., Disp: 60 tablet, Rfl: 11   sertraline (ZOLOFT) 100 MG tablet, Take 2 tablets (200 mg total) by mouth daily., Disp: 60 tablet, Rfl: 3   SILDENAFIL CITRATE PO, Take 45-90 mg by mouth daily as needed (ED). Take 1 - 2 tablets 30 to 45 min before sexual activity as needed, Disp: , Rfl:    spironolactone (ALDACTONE) 50 MG tablet, Take 1 tablet (50 mg total) by mouth daily., Disp: 30 tablet, Rfl: 6   varenicline (CHANTIX) 1 MG tablet, Take 2 mg by mouth 2 (two) times daily.,  Disp: , Rfl:    warfarin (COUMADIN) 5 MG tablet, Take 1.5 tablets to 2 tablets by mouth once daily as directed by Anticoagulation clinic, Disp: 55 tablet, Rfl: 3  Past Medical History: Past Medical History:  Diagnosis Date   Acid reflux    AICD (automatic cardioverter/defibrillator) present    Anxiety    Aortic stenosis with bicuspid valve    CHF (congestive heart failure) (HCC)    Depression    Dyspnea    GERD (gastroesophageal reflux disease)    HLD (hyperlipidemia)    Ischemic cardiomyopathy    Migraines    Myocardial infarction (HCC) 08/28/2019    Tobacco Use: Social History   Tobacco Use  Smoking Status Former   Current packs/day: 0.00   Types:  Cigarettes   Quit date: 02/15/2022   Years since quitting: 0.6  Smokeless Tobacco Never  Tobacco Comments   x15 yrs as of 2021    Labs: Review Flowsheet  More data exists      Latest Ref Rng & Units 01/11/2022 01/13/2022 02/17/2022 02/19/2022 09/16/2022  Labs for ITP Cardiac and Pulmonary Rehab  Cholestrol 0 - 200 mg/dL - 161  - - 096   LDL (calc) 0 - 99 mg/dL - 36  - - 46   HDL-C >04 mg/dL - 36  - - 45   Trlycerides <150 mg/dL - 540  - - 981   Hemoglobin A1c 4.8 - 5.6 % - - 5.8  - -  PH, Arterial 7.35 - 7.45 - - 7.44  7.304  7.295  7.290  7.312  7.353  7.259  7.328  7.327  -  PCO2 arterial 32 - 48 mmHg - - 39  45.9  46.7  47.5  47.8  38.4  55.3  45.6  44.1  -  Bicarbonate 20.0 - 28.0 mmol/L - - 26.5  22.8  22.8  23.1  24.2  21.4  24.8  24.8  24.0  23.1  -  TCO2 22 - 32 mmol/L 20  - - 24  24  25  24  26  23  26  27  26  25  26  24  24   -  Acid-base deficit 0.0 - 2.0 mmol/L - - - 4.0  4.0  4.0  2.0  4.0  3.0  2.0  2.0  3.0  -  O2 Saturation % - - 100  95  97  95  99  100  100  82  100  100  -    Details       Multiple values from one day are sorted in reverse-chronological order         Capillary Blood Glucose: Lab Results  Component Value Date   GLUCAP 87 02/23/2022   GLUCAP 100 (H) 02/23/2022   GLUCAP 91 02/23/2022   GLUCAP 117 (H) 02/22/2022   GLUCAP 104 (H) 02/22/2022     Exercise Target Goals: Exercise Program Goal: Individual exercise prescription set using results from initial 6 min walk test and THRR while considering  patient's activity barriers and safety.   Exercise Prescription Goal: Initial exercise prescription builds to 30-45 minutes a day of aerobic activity, 2-3 days per week.  Home exercise guidelines will be given to patient during program as part of exercise prescription that the participant will acknowledge.  Activity Barriers & Risk Stratification:  Activity Barriers & Cardiac Risk Stratification - 10/09/22 1527       Activity Barriers &  Cardiac Risk Stratification  Activity Barriers History of Falls    Cardiac Risk Stratification High   under 5 MET's on            6 Minute Walk:  6 Minute Walk     Row Name 10/09/22 1522         6 Minute Walk   Phase Initial     Distance 1440 feet     Walk Time 6 minutes     # of Rest Breaks 0     MPH 2.73     METS 2.98     RPE 11     Perceived Dyspnea  0     VO2 Peak 10.44     Symptoms No     Resting HR 77 bpm     Resting BP 112/74     Resting Oxygen Saturation  96 %     Exercise Oxygen Saturation  during 6 min walk 98 %     Max Ex. HR 98 bpm     Max Ex. BP 130/82     2 Minute Post BP 114/74              Oxygen Initial Assessment:   Oxygen Re-Evaluation:   Oxygen Discharge (Final Oxygen Re-Evaluation):   Initial Exercise Prescription:  Initial Exercise Prescription - 10/09/22 1500       Date of Initial Exercise RX and Referring Provider   Date 10/09/22    Referring Provider Dr. Marca Ancona, MD    Expected Discharge Date 12/31/22      Treadmill   MPH 2.4    Grade 0    Minutes 15    METs 2.84      Bike   Level 2    Watts 40    Minutes 30    METs 2.5      Prescription Details   Frequency (times per week) 3    Duration Progress to 30 minutes of continuous aerobic without signs/symptoms of physical distress      Intensity   THRR 40-80% of Max Heartrate 65-131    Ratings of Perceived Exertion 11-13    Perceived Dyspnea 0-4      Progression   Progression Continue progressive overload as per policy without signs/symptoms or physical distress.      Resistance Training   Training Prescription Yes   Will need cuff weight for left hand   Weight 3    Reps 10-15             Perform Capillary Blood Glucose checks as needed.  Exercise Prescription Changes:   Exercise Prescription Changes     Row Name 10/13/22 1036 10/20/22 1042           Response to Exercise   Blood Pressure (Admit) 100/62 92/60      Blood Pressure  (Exercise) 140/68 122/72      Blood Pressure (Exit) 102/78 90/60      Heart Rate (Admit) 77 bpm 77 bpm      Heart Rate (Exercise) 102 bpm 118 bpm      Heart Rate (Exit) 79 bpm 89 bpm      Rating of Perceived Exertion (Exercise) 11 12      Symptoms None None      Comments Off to a great start with exercise. Reviewed METs with Rudell Cobb. Increased WL on bike and TM.      Duration Continue with 30 min of aerobic exercise without signs/symptoms of physical distress. Continue with 30 min of  aerobic exercise without signs/symptoms of physical distress.      Intensity THRR unchanged THRR unchanged        Progression   Progression Continue to progress workloads to maintain intensity without signs/symptoms of physical distress. Continue to progress workloads to maintain intensity without signs/symptoms of physical distress.      Average METs 3.5 4.1        Resistance Training   Training Prescription Yes  cuff weights Yes  cuff weights      Weight 3 3      Reps 10-15 10-15      Time 10 Minutes 10 Minutes        Interval Training   Interval Training No No        Treadmill   MPH 2.8 3.2      Grade 1 3      Minutes 16 16      METs 3.53 4.77        Bike   Level 4 8      Watts 42 --      Minutes 19 19      METs 3.4 3.5               Exercise Comments:   Exercise Comments     Row Name 10/13/22 1132 10/20/22 1103         Exercise Comments Kent tolerated 1st session of exercise well without symptoms. Introduced to exercise and stretching routine. Reviewed METs with Rudell Cobb.               Exercise Goals and Review:   Exercise Goals     Row Name 10/09/22 1358             Exercise Goals   Increase Physical Activity Yes       Intervention Provide advice, education, support and counseling about physical activity/exercise needs.;Develop an individualized exercise prescription for aerobic and resistive training based on initial evaluation findings, risk stratification,  comorbidities and participant's personal goals.       Expected Outcomes Short Term: Attend rehab on a regular basis to increase amount of physical activity.;Long Term: Exercising regularly at least 3-5 days a week.;Long Term: Add in home exercise to make exercise part of routine and to increase amount of physical activity.       Increase Strength and Stamina Yes       Intervention Provide advice, education, support and counseling about physical activity/exercise needs.;Develop an individualized exercise prescription for aerobic and resistive training based on initial evaluation findings, risk stratification, comorbidities and participant's personal goals.       Expected Outcomes Short Term: Increase workloads from initial exercise prescription for resistance, speed, and METs.;Short Term: Perform resistance training exercises routinely during rehab and add in resistance training at home;Long Term: Improve cardiorespiratory fitness, muscular endurance and strength as measured by increased METs and functional capacity ( )       Able to understand and use rate of perceived exertion (RPE) scale Yes       Intervention Provide education and explanation on how to use RPE scale       Expected Outcomes Short Term: Able to use RPE daily in rehab to express subjective intensity level;Long Term:  Able to use RPE to guide intensity level when exercising independently       Knowledge and understanding of Target Heart Rate Range (THRR) Yes       Intervention Provide education and explanation of THRR including how the numbers were  predicted and where they are located for reference       Expected Outcomes Short Term: Able to state/look up THRR;Short Term: Able to use daily as guideline for intensity in rehab;Long Term: Able to use THRR to govern intensity when exercising independently       Understanding of Exercise Prescription Yes       Intervention Provide education, explanation, and written materials on patient's  individual exercise prescription       Expected Outcomes Short Term: Able to explain program exercise prescription;Long Term: Able to explain home exercise prescription to exercise independently                Exercise Goals Re-Evaluation :  Exercise Goals Re-Evaluation     Row Name 10/13/22 1132             Exercise Goal Re-Evaluation   Exercise Goals Review Increase Physical Activity;Increase Strength and Stamina;Able to understand and use rate of perceived exertion (RPE) scale;Knowledge and understanding of Target Heart Rate Range (THRR)       Comments Rudell Cobb was able to understand and use RPE scale appropriately. Reviewed target heart rate range with him. Increased workloads on treadmill and bike.       Expected Outcomes Progress workloads as tolerated to help increase strength and stamina.                Discharge Exercise Prescription (Final Exercise Prescription Changes):  Exercise Prescription Changes - 10/20/22 1042       Response to Exercise   Blood Pressure (Admit) 92/60    Blood Pressure (Exercise) 122/72    Blood Pressure (Exit) 90/60    Heart Rate (Admit) 77 bpm    Heart Rate (Exercise) 118 bpm    Heart Rate (Exit) 89 bpm    Rating of Perceived Exertion (Exercise) 12    Symptoms None    Comments Reviewed METs with Kent. Increased WL on bike and TM.    Duration Continue with 30 min of aerobic exercise without signs/symptoms of physical distress.    Intensity THRR unchanged      Progression   Progression Continue to progress workloads to maintain intensity without signs/symptoms of physical distress.    Average METs 4.1      Resistance Training   Training Prescription Yes   cuff weights   Weight 3    Reps 10-15    Time 10 Minutes      Interval Training   Interval Training No      Treadmill   MPH 3.2    Grade 3    Minutes 16    METs 4.77      Bike   Level 8    Minutes 19    METs 3.5             Nutrition:  Target Goals:  Understanding of nutrition guidelines, daily intake of sodium 1500mg , cholesterol 200mg , calories 30% from fat and 7% or less from saturated fats, daily to have 5 or more servings of fruits and vegetables.  Biometrics:  Pre Biometrics - 10/09/22 1523       Pre Biometrics   Waist Circumference 39 inches    Hip Circumference 40.5 inches    Waist to Hip Ratio 0.96 %    Triceps Skinfold 25 mm    % Body Fat 29.6 %    Grip Strength 35 kg    Flexibility 15.75 in    Single Leg Stand 30 seconds  Nutrition Therapy Plan and Nutrition Goals:  Nutrition Therapy & Goals - 10/13/22 1113       Nutrition Therapy   Diet Heart Healthy Diet    Drug/Food Interactions Statins/Certain Fruits      Personal Nutrition Goals   Nutrition Goal Patient to identify strategies for reducing cardiovascular risk by attending the [employer] education and nutrition series weekly.    Personal Goal #2 Patient to improve diet quality by using the plate method as a guide for meal planning to include lean protein/plant protein, fruits, vegetables, whole grains, nonfat dairy as part of a well-balanced diet.    Personal Goal #3 Patient to reduce sodium to 1500mg  per day    Comments Mikael reports no nutrition questions/concerns at this time. Per documentation, he has stopped alcohol ~4 months ago; he continues regular follow-up with behavioal health.  He continues regular follow-up with anti-coagulation clinic. Trilgycerides remain elevated; taking fenofibrate. Patient will benefit from participation in intensive cardiac rehab for nutrition, exercise, and lifestyle modification.      Intervention Plan   Intervention Prescribe, educate and counsel regarding individualized specific dietary modifications aiming towards targeted core components such as weight, hypertension, lipid management, diabetes, heart failure and other comorbidities.;Nutrition handout(s) given to patient.    Expected Outcomes Short Term  Goal: Understand basic principles of dietary content, such as calories, fat, sodium, cholesterol and nutrients.;Long Term Goal: Adherence to prescribed nutrition plan.             Nutrition Assessments:  Nutrition Assessments - 10/13/22 1401       Rate Your Plate Scores   Pre Score 60            MEDIFICTS Score Key: >=70 Need to make dietary changes  40-70 Heart Healthy Diet <= 40 Therapeutic Level Cholesterol Diet   Flowsheet Row CARDIAC REHAB PHASE II EXERCISE from 10/13/2022 in [facility]  Picture Your Plate Total Score on Admission 60      Picture Your Plate Scores: <45 Unhealthy dietary pattern with much room for improvement. 41-50 Dietary pattern unlikely to meet recommendations for good health and room for improvement. 51-60 More healthful dietary pattern, with some room for improvement.  >60 Healthy dietary pattern, although there may be some specific behaviors that could be improved.    Nutrition Goals Re-Evaluation:  Nutrition Goals Re-Evaluation     Row Name 10/13/22 1113             Goals   Current Weight 203 lb 11.3 oz (92.4 kg)       Comment trilglycerides 368 (taking fenofibrate),       Expected Outcome Cicero reports no nutrition questions/concerns at this time. Per documentation, he has stopped alcohol ~4 months ago; he continues regular follow-up with behavioal health. He continues regular follow-up with anti-coagulation clinic. Trilgycerides remain elevated; taking fenofibrate. Patient will benefit from participation in intensive cardiac rehab for nutrition, exercise, and lifestyle modification.                Nutrition Goals Re-Evaluation:  Nutrition Goals Re-Evaluation     Row Name 10/13/22 1113             Goals   Current Weight 203 lb 11.3 oz (92.4 kg)       Comment trilglycerides 368 (taking fenofibrate),       Expected Outcome Emile reports no nutrition  questions/concerns at this time. Per documentation, he has stopped alcohol ~4 months ago;  he continues regular follow-up with behavioal health. He continues regular follow-up with anti-coagulation clinic. Trilgycerides remain elevated; taking fenofibrate. Patient will benefit from participation in intensive cardiac rehab for nutrition, exercise, and lifestyle modification.                Nutrition Goals Discharge (Final Nutrition Goals Re-Evaluation):  Nutrition Goals Re-Evaluation - 10/13/22 1113       Goals   Current Weight 203 lb 11.3 oz (92.4 kg)    Comment trilglycerides 368 (taking fenofibrate),    Expected Outcome Yona reports no nutrition questions/concerns at this time. Per documentation, he has stopped alcohol ~4 months ago; he continues regular follow-up with behavioal health. He continues regular follow-up with anti-coagulation clinic. Trilgycerides remain elevated; taking fenofibrate. Patient will benefit from participation in intensive cardiac rehab for nutrition, exercise, and lifestyle modification.             Psychosocial: Target Goals: Acknowledge presence or absence of significant depression and/or stress, maximize coping skills, provide positive support system. Participant is able to verbalize types and ability to use techniques and skills needed for reducing stress and depression.  Initial Review & Psychosocial Screening:  Initial Psych Review & Screening - 10/09/22 1323       Initial Review   Current issues with Current Depression;History of Depression;Current Stress Concerns    Source of Stress Concerns Financial    Comments Stess mostly related to finances      Family Dynamics   Good Support System? Yes   child and wife     Barriers   Psychosocial barriers to participate in program The patient should benefit from training in stress management and relaxation.      Screening Interventions   Interventions Encouraged to exercise;To provide support and  resources with identified psychosocial needs;Provide feedback about the scores to participant    Expected Outcomes Long Term goal: The participant improves quality of Life and PHQ9 Scores as seen by post scores and/or verbalization of changes;Short Term goal: Identification and review with participant of any Quality of Life or Depression concerns found by scoring the questionnaire.             Quality of Life Scores:  Quality of Life - 10/09/22 1538       Quality of Life   Select Quality of Life      Quality of Life Scores   Health/Function Pre 9.53 %    Socioeconomic Pre 13.93 %    Psych/Spiritual Pre 5.79 %    Family Pre 23.7 %    GLOBAL Pre 11.75 %            Scores of 19 and below usually indicate a poorer quality of life in these areas.  A difference of  2-3 points is a clinically meaningful difference.  A difference of 2-3 points in the total score of the Quality of Life Index has been associated with significant improvement in overall quality of life, self-image, physical symptoms, and general health in studies assessing change in quality of life.  PHQ-9: Review Flowsheet  More data exists      10/09/2022 08/15/2022 06/06/2022 03/26/2022 10/28/2021  Depression screen PHQ 2/9  Decreased Interest 1      Down, Depressed, Hopeless 1      PHQ - 2 Score 2      Altered sleeping 0      Tired, decreased energy 2      Change in appetite 0      Feeling bad  or failure about yourself  1      Trouble concentrating 1      Moving slowly or fidgety/restless 1      Suicidal thoughts 0      PHQ-9 Score 7      Difficult doing work/chores Somewhat difficult        Details       Information is confidential and restricted. Go to Review Flowsheets to unlock data.        Interpretation of Total Score  Total Score Depression Severity:  1-4 = Minimal depression, 5-9 = Mild depression, 10-14 = Moderate depression, 15-19 = Moderately severe depression, 20-27 = Severe depression    Psychosocial Evaluation and Intervention:   Psychosocial Re-Evaluation:  Psychosocial Re-Evaluation     Row Name 10/13/22 1445 10/16/22 1621           Psychosocial Re-Evaluation   Current issues with Current Depression;History of Depression;Current Stress Concerns Current Depression;History of Depression;Current Stress Concerns      Comments Rocky Link started cardiac rehab on 10/13/22. Rocky Link did not voice any increased concerns or stressors.Pt started cardiac rehab today.  Pt tolerated light exercise without difficulty. VSS, telemetry-Sinus Rhythm, bundle branch, block asymptomatic. Kent started cardiac rehab on 10/13/22. Rocky Link did not voice any increased concerns or stressors. Will review quality of life in the upcoming week      Expected Outcomes -- Rudell Cobb will have decreased or controlled stress and depression upon completion of cardiac rehab.      Interventions -- Stress management education;Encouraged to attend Cardiac Rehabilitation for the exercise;Relaxation education        Initial Review   Source of Stress Concerns -- Financial      Comments -- Will continue to monitor and offer support as needed               Psychosocial Discharge (Final Psychosocial Re-Evaluation):  Psychosocial Re-Evaluation - 10/16/22 1621       Psychosocial Re-Evaluation   Current issues with Current Depression;History of Depression;Current Stress Concerns    Comments Rudell Cobb started cardiac rehab on 10/13/22. Rocky Link did not voice any increased concerns or stressors. Will review quality of life in the upcoming week    Expected Outcomes Rudell Cobb will have decreased or controlled stress and depression upon completion of cardiac rehab.    Interventions Stress management education;Encouraged to attend Cardiac Rehabilitation for the exercise;Relaxation education      Initial Review   Source of Stress Concerns Financial    Comments Will continue to monitor and offer support as needed             Vocational  Rehabilitation: Provide vocational rehab assistance to qualifying candidates.   Vocational Rehab Evaluation & Intervention:  Vocational Rehab - 10/09/22 1541       Initial Vocational Rehab Evaluation & Intervention   Assessment shows need for Vocational Rehabilitation No   No needs at this time. He will let us know if he needs resources in the future            Education: Education Goals: Education classes will be provided on a weekly basis, covering required topics. Participant will state understanding/return demonstration of topics presented.    Education     Row Name 10/13/22 1500     Education   Cardiac Education Topics [facility]     Workshops   Biomedical scientist Psychosocial   Psychosocial Workshop Recognizing and Reducing Stress   Instruction Review Code 1- [employer] Understanding  Class Start Time 1148   Class Stop Time 1237   Class Time Calculation (min) 49 min    Row Name 10/15/22 1400     Education   Cardiac Education Topics [facility]   Orthoptist   Educator Dietitian   Weekly Topic Tasty Appetizers and Snacks   Instruction Review Code 1- Verbalizes Understanding   Class Start Time 1145   Class Stop Time 1225   Class Time Calculation (min) 40 min    Row Name 10/17/22 1200     Education   Cardiac Education Topics Pritikin   Nurse, children's Exercise Physiologist   Select Nutrition   Nutrition Calorie Density   Instruction Review Code 1- Verbalizes Understanding   Class Start Time 1155   Class Stop Time 1235   Class Time Calculation (min) 40 min    Row Name 10/20/22 1300     Education   Cardiac Education Topics Pritikin   Select Workshops     Workshops   Educator Exercise Physiologist   Select Exercise   Exercise Workshop Exercise Basics: Building Your Action Plan   Instruction Review Code 1- Verbalizes Understanding   Class Start Time 1155   Class Stop  Time 1237   Class Time Calculation (min) 42 min            Core Videos: Exercise    Move It!  Clinical staff conducted group or individual video education with verbal and written material and guidebook.  Patient learns the recommended Pritikin exercise program. Exercise with the goal of living a long, healthy life. Some of the health benefits of exercise include controlled diabetes, healthier blood pressure levels, improved cholesterol levels, improved heart and lung capacity, improved sleep, and better body composition. Everyone should speak with their doctor before starting or changing an exercise routine.  Biomechanical Limitations Clinical staff conducted group or individual video education with verbal and written material and guidebook.  Patient learns how biomechanical limitations can impact exercise and how we can mitigate and possibly overcome limitations to have an impactful and balanced exercise routine.  Body Composition Clinical staff conducted group or individual video education with verbal and written material and guidebook.  Patient learns that body composition (ratio of muscle mass to fat mass) is a key component to assessing overall fitness, rather than body weight alone. Increased fat mass, especially visceral belly fat, can put [place] at increased risk for metabolic syndrome, type 2 diabetes, heart disease, and even death. It is recommended to combine diet and exercise (cardiovascular and resistance training) to improve your body composition. Seek guidance from your physician and exercise physiologist before implementing an exercise routine.  Exercise Action Plan Clinical staff conducted group or individual video education with verbal and written material and guidebook.  Patient learns the recommended strategies to achieve and enjoy long-term exercise adherence, including variety, self-motivation, self-efficacy, and positive decision making. Benefits of exercise include  fitness, good health, weight management, more energy, better sleep, less stress, and overall well-being.  Medical   Heart Disease Risk Reduction Clinical staff conducted group or individual video education with verbal and written material and guidebook.  Patient learns our heart is our most vital organ as it circulates oxygen, nutrients, white blood cells, and hormones throughout the entire body, and carries waste away. Data supports a plant-based eating plan like the Pritikin Program for its effectiveness in slowing progression of and reversing heart disease.  The video provides a number of recommendations to address heart disease.   Metabolic Syndrome and Belly Fat  Clinical staff conducted group or individual video education with verbal and written material and guidebook.  Patient learns what metabolic syndrome is, how it leads to heart disease, and how one can reverse it and keep it from coming back. You have metabolic syndrome if you have 3 of the following 5 criteria: abdominal obesity, high blood pressure, high triglycerides, low HDL cholesterol, and high blood sugar.  Hypertension and Heart Disease Clinical staff conducted group or individual video education with verbal and written material and guidebook.  Patient learns that high blood pressure, or hypertension, is very common in the [place]. Hypertension is largely due to excessive salt intake, but other important risk factors include being overweight, physical inactivity, drinking too much alcohol, smoking, and not eating enough potassium from fruits and vegetables. High blood pressure is a leading risk factor for heart attack, stroke, congestive heart failure, dementia, kidney failure, and premature death. Long-term effects of excessive salt intake include stiffening of the arteries and thickening of heart muscle and organ damage. Recommendations include ways to reduce hypertension and the risk of heart disease.  Diseases of Our Time  - Focusing on Diabetes Clinical staff conducted group or individual video education with verbal and written material and guidebook.  Patient learns why the best way to stop diseases of our time is prevention, through food and other lifestyle changes. Medicine (such as prescription pills and surgeries) is often only a Band-Aid on the problem, not a long-term solution. Most common diseases of our time include obesity, type 2 diabetes, hypertension, heart disease, and cancer. The Pritikin Program is recommended and has been proven to help reduce, reverse, and/or prevent the damaging effects of metabolic syndrome.  Nutrition   Overview of the Pritikin Eating Plan  Clinical staff conducted group or individual video education with verbal and written material and guidebook.  Patient learns about the Pritikin Eating Plan for disease risk reduction. The Pritikin Eating Plan emphasizes a wide variety of unrefined, minimally-processed carbohydrates, like fruits, vegetables, whole grains, and legumes. Go, Caution, and Stop food choices are explained. Plant-based and lean animal proteins are emphasized. Rationale provided for low sodium intake for blood pressure control, low added sugars for blood sugar stabilization, and low added fats and oils for coronary artery disease risk reduction and weight management.  Calorie Density  Clinical staff conducted group or individual video education with verbal and written material and guidebook.  Patient learns about calorie density and how it impacts the Pritikin Eating Plan. Knowing the characteristics of the food you choose will help you decide whether those foods will lead to weight gain or weight loss, and whether you want to consume more or less of them. Weight loss is usually a side effect of the Pritikin Eating Plan because of its focus on low calorie-dense foods.  Label Reading  Clinical staff conducted group or individual video education with verbal and written  material and guidebook.  Patient learns about the Pritikin recommended label reading guidelines and corresponding recommendations regarding calorie density, added sugars, sodium content, and whole grains.  Dining Out - Part 1  Clinical staff conducted group or individual video education with verbal and written material and guidebook.  Patient learns that restaurant meals can be sabotaging because they can be so high in calories, fat, sodium, and/or sugar. Patient learns recommended strategies on how to positively address this and avoid unhealthy pitfalls.  Facts on Fats  Clinical staff conducted group or individual video education with verbal and written material and guidebook.  Patient learns that lifestyle modifications can be just as effective, if not more so, as many medications for lowering your risk of heart disease. A Pritikin lifestyle can help to reduce your risk of inflammation and atherosclerosis (cholesterol build-up, or plaque, in the artery walls). Lifestyle interventions such as dietary choices and physical activity address the cause of atherosclerosis. A review of the types of fats and their impact on blood cholesterol levels, along with dietary recommendations to reduce fat intake is also included.  Nutrition Action Plan  Clinical staff conducted group or individual video education with verbal and written material and guidebook.  Patient learns how to incorporate Pritikin recommendations into their lifestyle. Recommendations include planning and keeping personal health goals in mind as an important part of their success.  Healthy Mind-Set    Healthy Minds, Bodies, Hearts  Clinical staff conducted group or individual video education with verbal and written material and guidebook.  Patient learns how to identify when they are stressed. Video will discuss the impact of that stress, as well as the many benefits of stress management. Patient will also be introduced to stress management  techniques. The way we think, act, and feel has an impact on our hearts.  How Our Thoughts Can Heal Our Hearts  Clinical staff conducted group or individual video education with verbal and written material and guidebook.  Patient learns that negative thoughts can cause depression and anxiety. This can result in negative lifestyle behavior and serious health problems. Cognitive behavioral therapy is an effective method to help control our thoughts in order to change and improve our emotional outlook.  Additional Videos:  Exercise    Improving Performance  Clinical staff conducted group or individual video education with verbal and written material and guidebook.  Patient learns to use a non-linear approach by alternating intensity levels and lengths of time spent exercising to help burn more calories and lose more body fat. Cardiovascular exercise helps improve heart health, metabolism, hormonal balance, blood sugar control, and recovery from fatigue. Resistance training improves strength, endurance, balance, coordination, reaction time, metabolism, and muscle mass. Flexibility exercise improves circulation, posture, and balance. Seek guidance from your physician and exercise physiologist before implementing an exercise routine and learn your capabilities and proper form for all exercise.  Introduction to Yoga  Clinical staff conducted group or individual video education with verbal and written material and guidebook.  Patient learns about yoga, a discipline of the coming together of mind, breath, and body. The benefits of yoga include improved flexibility, improved range of motion, better posture and core strength, increased lung function, weight loss, and positive self-image. Yoga's heart health benefits include lowered blood pressure, healthier heart rate, decreased cholesterol and triglyceride levels, improved immune function, and reduced stress. Seek guidance from your physician and exercise  physiologist before implementing an exercise routine and learn your capabilities and proper form for all exercise.  Medical   Aging: Enhancing Your Quality of Life  Clinical staff conducted group or individual video education with verbal and written material and guidebook.  Patient learns key strategies and recommendations to stay in good physical health and enhance quality of life, such as prevention strategies, having an advocate, securing a Health Care Proxy and Power of Attorney, and keeping a list of medications and system for tracking them. It also discusses how to avoid risk for bone loss.  Biology of Weight Control  Clinical staff conducted group or individual video education with verbal and written material and guidebook.  Patient learns that weight gain occurs because we consume more calories than we burn (eating more, moving less). Even if your body weight is normal, you may have higher ratios of fat compared to muscle mass. Too much body fat puts you at increased risk for cardiovascular disease, heart attack, stroke, type 2 diabetes, and obesity-related cancers. In addition to exercise, following the Pritikin Eating Plan can help reduce your risk.  Decoding Lab Results  Clinical staff conducted group or individual video education with verbal and written material and guidebook.  Patient learns that lab test reflects one measurement whose values change over time and are influenced by many factors, including medication, stress, sleep, exercise, food, hydration, pre-existing medical conditions, and more. It is recommended to use the knowledge from this video to become more involved with your lab results and evaluate your numbers to speak with your doctor.   Diseases of Our Time - Overview  Clinical staff conducted group or individual video education with verbal and written material and guidebook.  Patient learns that according to the CDC, 50% to 70% of chronic diseases (such as obesity,  type 2 diabetes, elevated lipids, hypertension, and heart disease) are avoidable through lifestyle improvements including healthier food choices, listening to satiety cues, and increased physical activity.  Sleep Disorders Clinical staff conducted group or individual video education with verbal and written material and guidebook.  Patient learns how good quality and duration of sleep are important to overall health and well-being. Patient also learns about sleep disorders and how they impact health along with recommendations to address them, including discussing with a physician.  Nutrition  Dining Out - Part 2 Clinical staff conducted group or individual video education with verbal and written material and guidebook.  Patient learns how to plan ahead and communicate in order to maximize their dining experience in a healthy and nutritious manner. Included are recommended food choices based on the type of restaurant the patient is visiting.   Fueling a Banker conducted group or individual video education with verbal and written material and guidebook.  There is a strong connection between our food choices and our health. Diseases like obesity and type 2 diabetes are very prevalent and are in large-part due to lifestyle choices. The Pritikin Eating Plan provides plenty of food and hunger-curbing satisfaction. It is easy to follow, affordable, and helps reduce health risks.  Menu Workshop  Clinical staff conducted group or individual video education with verbal and written material and guidebook.  Patient learns that restaurant meals can sabotage health goals because they are often packed with calories, fat, sodium, and sugar. Recommendations include strategies to plan ahead and to communicate with the manager, chef, or server to help order a healthier meal.  Planning Your Eating Strategy  Clinical staff conducted group or individual video education with verbal and written  material and guidebook.  Patient learns about the Pritikin Eating Plan and its benefit of reducing the risk of disease. The Pritikin Eating Plan does not focus on calories. Instead, it emphasizes high-quality, nutrient-rich foods. By knowing the characteristics of the foods, we choose, we can determine their calorie density and make informed decisions.  Targeting Your Nutrition Priorities  Clinical staff conducted group or individual video education with verbal and written material and guidebook.  Patient learns that lifestyle habits have a tremendous impact on disease risk and progression. This video provides eating  and physical activity recommendations based on your personal health goals, such as reducing LDL cholesterol, losing weight, preventing or controlling type 2 diabetes, and reducing high blood pressure.  Vitamins and Minerals  Clinical staff conducted group or individual video education with verbal and written material and guidebook.  Patient learns different ways to obtain key vitamins and minerals, including through a recommended healthy diet. It is important to discuss all supplements you take with your doctor.   Healthy Mind-Set    Smoking Cessation  Clinical staff conducted group or individual video education with verbal and written material and guidebook.  Patient learns that cigarette smoking and tobacco addiction pose a serious health risk which affects millions of people. Stopping smoking will significantly reduce the risk of heart disease, lung disease, and many forms of cancer. Recommended strategies for quitting are covered, including working with your doctor to develop a successful plan.  Culinary   Becoming a Set designer conducted group or individual video education with verbal and written material and guidebook.  Patient learns that cooking at home can be healthy, cost-effective, quick, and puts them in control. Keys to cooking healthy recipes will  include looking at your recipe, assessing your equipment needs, planning ahead, making it simple, choosing cost-effective seasonal ingredients, and limiting the use of added fats, salts, and sugars.  Cooking - Breakfast and Snacks  Clinical staff conducted group or individual video education with verbal and written material and guidebook.  Patient learns how important breakfast is to satiety and nutrition through the entire day. Recommendations include key foods to eat during breakfast to help stabilize blood sugar levels and to prevent overeating at meals later in the day. Planning ahead is also a key component.  Cooking - Educational psychologist conducted group or individual video education with verbal and written material and guidebook.  Patient learns eating strategies to improve overall health, including an approach to cook more at home. Recommendations include thinking of animal protein as a side on your plate rather than center stage and focusing instead on lower calorie dense options like vegetables, fruits, whole grains, and plant-based proteins, such as beans. Making sauces in large quantities to freeze for later and leaving the skin on your vegetables are also recommended to maximize your experience.  Cooking - Healthy Salads and Dressing Clinical staff conducted group or individual video education with verbal and written material and guidebook.  Patient learns that vegetables, fruits, whole grains, and legumes are the foundations of the Pritikin Eating Plan. Recommendations include how to incorporate each of these in flavorful and healthy salads, and how to create homemade salad dressings. Proper handling of ingredients is also covered. Cooking - Soups and [employer] - Soups and Desserts Clinical staff conducted group or individual video education with verbal and written material and guidebook.  Patient learns that Pritikin soups and desserts make for easy, nutritious, and  delicious snacks and meal components that are low in sodium, fat, sugar, and calorie density, while high in vitamins, minerals, and filling fiber. Recommendations include simple and healthy ideas for soups and desserts.   Overview     The Pritikin Solution Program Overview Clinical staff conducted group or individual video education with verbal and written material and guidebook.  Patient learns that the results of the Pritikin Program have been documented in more than 100 articles published in peer-reviewed journals, and the benefits include reducing risk factors for (and, in some cases, even reversing) high cholesterol,  high blood pressure, type 2 diabetes, obesity, and more! An overview of the three key pillars of the Pritikin Program will be covered: eating well, doing regular exercise, and having a healthy mind-set.  WORKSHOPS  Exercise: Exercise Basics: Building Your Action Plan Clinical staff led group instruction and group discussion with PowerPoint presentation and patient guidebook. To enhance the learning environment the use of posters, models and videos may be added. At the conclusion of this workshop, patients will comprehend the difference between physical activity and exercise, as well as the benefits of incorporating both, into their routine. Patients will understand the FITT (Frequency, Intensity, Time, and Type) principle and how to use it to build an exercise action plan. In addition, safety concerns and other considerations for exercise and cardiac rehab will be addressed by the presenter. The purpose of this lesson is to promote a comprehensive and effective weekly exercise routine in order to improve patients' overall level of fitness.   Managing Heart Disease: Your Path to a Healthier Heart Clinical staff led group instruction and group discussion with PowerPoint presentation and patient guidebook. To enhance the learning environment the use of posters, models and videos  may be added.At the conclusion of this workshop, patients will understand the anatomy and physiology of the heart. Additionally, they will understand how Pritikin's three pillars impact the risk factors, the progression, and the management of heart disease.  The purpose of this lesson is to provide a high-level overview of the heart, heart disease, and how the Pritikin lifestyle positively impacts risk factors.  Exercise Biomechanics Clinical staff led group instruction and group discussion with PowerPoint presentation and patient guidebook. To enhance the learning environment the use of posters, models and videos may be added. Patients will learn how the structural parts of their bodies function and how these functions impact their daily activities, movement, and exercise. Patients will learn how to promote a neutral spine, learn how to manage pain, and identify ways to improve their physical movement in order to promote healthy living. The purpose of this lesson is to expose patients to common physical limitations that impact physical activity. Participants will learn practical ways to adapt and manage aches and pains, and to minimize their effect on regular exercise. Patients will learn how to maintain good posture while sitting, walking, and lifting.  Balance Training and Fall Prevention  Clinical staff led group instruction and group discussion with PowerPoint presentation and patient guidebook. To enhance the learning environment the use of posters, models and videos may be added. At the conclusion of this workshop, patients will understand the importance of their sensorimotor skills (vision, proprioception, and the vestibular system) in maintaining their ability to balance as they age. Patients will apply a variety of balancing exercises that are appropriate for their current level of function. Patients will understand the common causes for poor balance, possible solutions to these  problems, and ways to modify their physical environment in order to minimize their fall risk. The purpose of this lesson is to teach patients about the importance of maintaining balance as they age and ways to minimize their risk of falling.  WORKSHOPS   Nutrition:  Fueling a Ship broker led group instruction and group discussion with PowerPoint presentation and patient guidebook. To enhance the learning environment the use of posters, models and videos may be added. Patients will review the foundational principles of the Pritikin Eating Plan and understand what constitutes a serving size in each of the food groups. Patients will  also learn Pritikin-friendly foods that are better choices when away from home and review make-ahead meal and snack options. Calorie density will be reviewed and applied to three nutrition priorities: weight maintenance, weight loss, and weight gain. The purpose of this lesson is to reinforce (in a group setting) the key concepts around what patients are recommended to eat and how to apply these guidelines when away from home by planning and selecting Pritikin-friendly options. Patients will understand how calorie density may be adjusted for different weight management goals.  Mindful Eating  Clinical staff led group instruction and group discussion with PowerPoint presentation and patient guidebook. To enhance the learning environment the use of posters, models and videos may be added. Patients will briefly review the concepts of the Pritikin Eating Plan and the importance of low-calorie dense foods. The concept of mindful eating will be introduced as well as the importance of paying attention to internal hunger signals. Triggers for non-hunger eating and techniques for dealing with triggers will be explored. The purpose of this lesson is to provide patients with the opportunity to review the basic principles of the Pritikin Eating Plan, discuss the value of  eating mindfully and how to measure internal cues of hunger and fullness using the Hunger Scale. Patients will also discuss reasons for non-hunger eating and learn strategies to use for controlling emotional eating.  Targeting Your Nutrition Priorities Clinical staff led group instruction and group discussion with PowerPoint presentation and patient guidebook. To enhance the learning environment the use of posters, models and videos may be added. Patients will learn how to determine their genetic susceptibility to disease by reviewing their family history. Patients will gain insight into the importance of diet as part of an overall healthy lifestyle in mitigating the impact of genetics and other environmental insults. The purpose of this lesson is to provide patients with the opportunity to assess their personal nutrition priorities by looking at their family history, their own health history and current risk factors. Patients will also be able to discuss ways of prioritizing and modifying the Pritikin Eating Plan for their highest risk areas  Menu  Clinical staff led group instruction and group discussion with PowerPoint presentation and patient guidebook. To enhance the learning environment the use of posters, models and videos may be added. Using menus brought in from [employer], or printed from [employer], patients will apply the Pritikin dining out guidelines that were presented in the [employer] video. Patients will also be able to practice these guidelines in a variety of provided scenarios. The purpose of this lesson is to provide patients with the opportunity to practice hands-on learning of the Pritikin Dining Out guidelines with actual menus and practice scenarios.  Label Reading Clinical staff led group instruction and group discussion with PowerPoint presentation and patient guidebook. To enhance the learning environment the use of posters, models and videos may be  added. Patients will review and discuss the Pritikin label reading guidelines presented in [employer]'s Label Reading Educational series video. Using fool labels brought in from local grocery stores and markets, patients will apply the label reading guidelines and determine if the packaged food meet the Pritikin guidelines. The purpose of this lesson is to provide patients with the opportunity to review, discuss, and practice hands-on learning of the Pritikin Label Reading guidelines with actual packaged food labels. Cooking School  Pritikin's [employer] are designed to teach patients ways to prepare quick, simple, and affordable recipes at home. The importance  of nutrition's role in chronic disease risk reduction is reflected in its emphasis in the overall Pritikin program. By learning how to prepare essential core Pritikin Eating Plan recipes, patients will increase control over what they eat; be able to customize the flavor of foods without the use of added salt, sugar, or fat; and improve the quality of the food they consume. By learning a set of core recipes which are easily assembled, quickly prepared, and affordable, patients are more likely to prepare more healthy foods at home. These workshops focus on convenient breakfasts, simple entres, side dishes, and desserts which can be prepared with minimal effort and are consistent with nutrition recommendations for cardiovascular risk reduction. Cooking [employer] are taught by a Armed forces logistics/support/administrative officer (RD) who has been trained by the [employer]. The chef or RD has a clear understanding of the importance of minimizing - if not completely eliminating - added fat, sugar, and sodium in recipes. Throughout the series of Cooking School Workshop sessions, patients will learn about healthy ingredients and efficient methods of cooking to build confidence in their capability to prepare    Cooking School weekly topics:  Adding  Flavor- Sodium-Free  Fast and Healthy Breakfasts  Powerhouse Plant-Based Proteins  Satisfying Salads and Dressings  Simple Sides and Sauces  International Cuisine-Spotlight on the [employer] Zones  Delicious Desserts  Savory Soups  [employer] - Meals in a Astronomer Appetizers and Snacks  Comforting Weekend Breakfasts  One-Pot Wonders   Fast Evening Meals  Landscape architect Your Pritikin Plate  WORKSHOPS   Healthy Mindset (Psychosocial):  Focused Goals, Sustainable Changes Clinical staff led group instruction and group discussion with PowerPoint presentation and patient guidebook. To enhance the learning environment the use of posters, models and videos may be added. Patients will be able to apply effective goal setting strategies to establish at least one personal goal, and then take consistent, meaningful action toward that goal. They will learn to identify common barriers to achieving personal goals and develop strategies to overcome them. Patients will also gain an understanding of how our mind-set can impact our ability to achieve goals and the importance of cultivating a positive and growth-oriented mind-set. The purpose of this lesson is to provide patients with a deeper understanding of how to set and achieve personal goals, as well as the tools and strategies needed to overcome common obstacles which may arise along the way.  From Head to Heart: The Power of a Healthy Outlook  Clinical staff led group instruction and group discussion with PowerPoint presentation and patient guidebook. To enhance the learning environment the use of posters, models and videos may be added. Patients will be able to recognize and describe the impact of emotions and mood on physical health. They will discover the importance of self-care and explore self-care practices which may work for them. Patients will also learn how to utilize the 4 C's to cultivate a healthier outlook and better  manage stress and challenges. The purpose of this lesson is to demonstrate to patients how a healthy outlook is an essential part of maintaining good health, especially as they continue their cardiac rehab journey.  Healthy Sleep for a Healthy Heart Clinical staff led group instruction and group discussion with PowerPoint presentation and patient guidebook. To enhance the learning environment the use of posters, models and videos may be added. At the conclusion of this workshop, patients will be able to demonstrate knowledge of the importance of sleep  to overall health, well-being, and quality of life. They will understand the symptoms of, and treatments for, common sleep disorders. Patients will also be able to identify daytime and nighttime behaviors which impact sleep, and they will be able to apply these tools to help manage sleep-related challenges. The purpose of this lesson is to provide patients with a general overview of sleep and outline the importance of quality sleep. Patients will learn about a few of the most common sleep disorders. Patients will also be introduced to the concept of "sleep hygiene," and discover ways to self-manage certain sleeping problems through simple daily behavior changes. Finally, the workshop will motivate patients by clarifying the links between quality sleep and their goals of heart-healthy living.   Recognizing and Reducing Stress Clinical staff led group instruction and group discussion with PowerPoint presentation and patient guidebook. To enhance the learning environment the use of posters, models and videos may be added. At the conclusion of this workshop, patients will be able to understand the types of stress reactions, differentiate between acute and chronic stress, and recognize the impact that chronic stress has on their health. They will also be able to apply different coping mechanisms, such as reframing negative self-talk. Patients will have the opportunity  to practice a variety of stress management techniques, such as deep abdominal breathing, progressive muscle relaxation, and/or guided imagery.  The purpose of this lesson is to educate patients on the role of stress in their lives and to provide healthy techniques for coping with it.  Learning Barriers/Preferences:  Learning Barriers/Preferences - 10/09/22 1539       Learning Barriers/Preferences   Learning Barriers Sight;Exercise Concerns   he wears glasses, history of falls   Learning Preferences Audio;Computer/Internet;Group Instruction;Individual Instruction;Pictoral;Skilled Demonstration;Verbal Instruction;Video;Written Material             Education Topics:  Knowledge Questionnaire Score:  Knowledge Questionnaire Score - 10/09/22 1523       Knowledge Questionnaire Score   Pre Score 24/24             Core Components/Risk Factors/Patient Goals at Admission:  Personal Goals and Risk Factors at Admission - 10/09/22 1358       Core Components/Risk Factors/Patient Goals on Admission    Weight Management Yes    Intervention Weight Management: Develop a combined nutrition and exercise program designed to reach desired caloric intake, while maintaining appropriate intake of nutrient and fiber, sodium and fats, and appropriate energy expenditure required for the weight goal.;Weight Management: Provide education and appropriate resources to help participant work on and attain dietary goals.    Expected Outcomes Short Term: Continue to assess and modify interventions until short term weight is achieved;Long Term: Adherence to nutrition and physical activity/exercise program aimed toward attainment of established weight goal;Understanding recommendations for meals to include 15-35% energy as protein, 25-35% energy from fat, 35-60% energy from carbohydrates, less than 200mg  of dietary cholesterol, 20-35 gm of total fiber daily;Understanding of distribution of calorie intake throughout  the day with the consumption of 4-5 meals/snacks    Heart Failure Yes    Intervention Provide a combined exercise and nutrition program that is supplemented with education, support and counseling about heart failure. Directed toward relieving symptoms such as shortness of breath, decreased exercise tolerance, and extremity edema.    Expected Outcomes Short term: Attendance in program 2-3 days a week with increased exercise capacity. Reported lower sodium intake. Reported increased fruit and vegetable intake. Reports medication compliance.;Short term: Daily weights obtained and reported  for increase. Utilizing diuretic protocols set by physician.;Long term: Adoption of self-care skills and reduction of barriers for early signs and symptoms recognition and intervention leading to self-care maintenance.;Improve functional capacity of life    Hypertension Yes    Intervention Provide education on lifestyle modifcations including regular physical activity/exercise, weight management, moderate sodium restriction and increased consumption of fresh fruit, vegetables, and low fat dairy, alcohol moderation, and smoking cessation.;Monitor prescription use compliance.    Expected Outcomes Short Term: Continued assessment and intervention until BP is < 140/27mm HG in hypertensive participants. < 130/10mm HG in hypertensive participants with diabetes, heart failure or chronic kidney disease.;Long Term: Maintenance of blood pressure at goal levels.    Lipids Yes    Intervention Provide education and support for participant on nutrition & aerobic/resistive exercise along with prescribed medications to achieve LDL 70mg , HDL >40mg .    Expected Outcomes Short Term: Participant states understanding of desired cholesterol values and is compliant with medications prescribed. Participant is following exercise prescription and nutrition guidelines.;Long Term: Cholesterol controlled with medications as prescribed, with  individualized exercise RX and with personalized nutrition plan. Value goals: LDL < 70mg , HDL > 40 mg.    Stress Yes    Intervention Offer individual and/or small group education and counseling on adjustment to heart disease, stress management and health-related lifestyle change. Teach and support self-help strategies.;Refer participants experiencing significant psychosocial distress to appropriate mental health specialists for further evaluation and treatment. When possible, include family members and significant others in education/counseling sessions.    Expected Outcomes Short Term: Participant demonstrates changes in health-related behavior, relaxation and other stress management skills, ability to obtain effective social support, and compliance with psychotropic medications if prescribed.;Long Term: Emotional wellbeing is indicated by absence of clinically significant psychosocial distress or social isolation.             Core Components/Risk Factors/Patient Goals Review:   Goals and Risk Factor Review     Row Name 10/13/22 1525 10/16/22 1624           Core Components/Risk Factors/Patient Goals Review   Personal Goals Review Weight Management/Obesity;Heart Failure;Hypertension;Lipids;Stress Weight Management/Obesity;Heart Failure;Hypertension;Lipids;Stress      Review Rocky Link started cardiac rehab on 10/13/22. Rocky Link did well with exercise. Vital signs were stable. Rocky Link started cardiac rehab on 10/13/22. Rocky Link is off to a good start to  exercise. Vital signs have been stable.      Expected Outcomes Rocky Link will continue to participate in cardiac rehab for exercise, nutriiton and lifestyle modifications. Rocky Link will continue to participate in cardiac rehab for exercise, nutriiton and lifestyle modifications.               Core Components/Risk Factors/Patient Goals at Discharge (Final Review):   Goals and Risk Factor Review - 10/16/22 1624       Core Components/Risk Factors/Patient Goals Review    Personal Goals Review Weight Management/Obesity;Heart Failure;Hypertension;Lipids;Stress    Review Rocky Link started cardiac rehab on 10/13/22. Rocky Link is off to a good start to  exercise. Vital signs have been stable.    Expected Outcomes Rocky Link will continue to participate in cardiac rehab for exercise, nutriiton and lifestyle modifications.             ITP Comments:  ITP Comments     Row Name 10/09/22 1357 10/13/22 1439 10/16/22 1619       ITP Comments Dr. Armanda Magic medical director. Introduction to pritikin education/ intensive cardiac rehab. Initial orientation packet reviewed with patient. 30 Day ITP comments. Rocky Link  started cardiac rehab on 10/13/22. Rocky Link did well with exercise. 30 Day ITP comments. Kent started cardiac rehab on 10/13/22. Rudell Cobb is off to a good start to exercise.              Comments: See ITP Comments

## 2022-10-22 ENCOUNTER — Encounter (HOSPITAL_COMMUNITY)
Admission: RE | Admit: 2022-10-22 | Discharge: 2022-10-22 | Disposition: A | Discharge status: Home / Self Care | Payer: Medicaid Other | Source: Ambulatory Visit | Attending: Cardiology

## 2022-10-22 DIAGNOSIS — I10 Essential (primary) hypertension: Secondary | ICD-10-CM | POA: Diagnosis not present

## 2022-10-22 DIAGNOSIS — I429 Cardiomyopathy, unspecified: Secondary | ICD-10-CM | POA: Diagnosis not present

## 2022-10-24 ENCOUNTER — Encounter (HOSPITAL_COMMUNITY)
Admission: RE | Admit: 2022-10-24 | Discharge: 2022-10-24 | Disposition: A | Discharge status: Home / Self Care | Payer: Medicaid Other | Source: Ambulatory Visit | Attending: Cardiology | Admitting: Cardiology

## 2022-10-24 DIAGNOSIS — I429 Cardiomyopathy, unspecified: Secondary | ICD-10-CM

## 2022-10-24 DIAGNOSIS — I10 Essential (primary) hypertension: Secondary | ICD-10-CM | POA: Diagnosis not present

## 2022-10-24 NOTE — Progress Notes (Addendum)
QUALITY OF LIFE SCORE REVIEW  Pt completed Quality of Life survey as a participant in Cardiac Rehab.  Scores 21.0 or below are considered low.  Pt score very low in several areas Overall 11.75, Health and Function 9.53, socioeconomic 13.93, physiological and spiritual 5.79, family 23.70. Patient quality of life slightly altered by physical constraints which limits ability to perform as prior to recent cardiac illness. Craig Flores admits to being depressed. Craig Flores says the antidepressants he is taking is controlling his depression. Craig Flores is not interested in counseling at this time. Craig Flores says he has received counseling in the past.Craig Flores says he is dissatisfied with his health and does not think he will live long due to his heart failure diagnosis. Craig Flores is currently not able to work. Craig Flores's wife works. Craig Flores is interested in applying for disability. I left a message with the [facility] Social Worker to see if they can help Craig Flores with this process. Craig Flores said he did not have health insurance when he had his initial cardiac event. Craig Flores has medicaid now.  Offered emotional support and reassurance.  Will continue to monitor and intervene as necessary. Thayer Headings RN BSN

## 2022-10-24 NOTE — Progress Notes (Signed)
Reviewed home exercise guidelines with [facility] including endpoints, temperature precautions, target heart rate and rate of perceived exertion. He is not currently exercising on his non-cardiac rehab days but is a member at [employer]. Discussed accumulating 150 minutes of aerobic exercise/week, and he is amenable to this. Rudell Cobb will add 2-4 days aerobic exercise/week in addition to exercise at cardiac rehab to meet ACSM guidelines for physical activity. Kent voices understanding of instructions given.  Artist Pais, MS, ACSM CEP

## 2022-10-27 ENCOUNTER — Encounter (HOSPITAL_COMMUNITY)
Admission: RE | Admit: 2022-10-27 | Discharge: 2022-10-27 | Disposition: A | Discharge status: Home / Self Care | Payer: Medicaid Other | Source: Ambulatory Visit | Attending: Cardiology | Admitting: Cardiology

## 2022-10-27 ENCOUNTER — Other Ambulatory Visit: Payer: Self-pay

## 2022-10-27 DIAGNOSIS — I10 Essential (primary) hypertension: Secondary | ICD-10-CM | POA: Diagnosis not present

## 2022-10-27 DIAGNOSIS — I429 Cardiomyopathy, unspecified: Secondary | ICD-10-CM

## 2022-10-27 NOTE — Progress Notes (Signed)
[facility] Note   PCP: Pcp, [name] Cardiology: Dr. Shirlee Latch   HPI: 57 y.o. male smoker was admitted on 08/28/19 for delayed presentation anterior STEMI. Had had >36 hr of chest pain prior to seeking medical attention. Pain progressed to more pleuritic like CP. Found to have anterior ST elevations w/ precordial Q waves on admit. Hs troponin >27,000. Urgent cardiac cath showed totally occluded mid LAD w/o collaterals and 40% mid RCA stenosis. It was suspected he had completed his infarct and residual pleuritic CP was post-MI pericarditis. No intervention was performed. 2D echo demonstrated moderately reduced LVEF, 45-50%, + apical thrombus and moderate AS. MV normal. RV systolic function mildly reduced.    He was placed on medical management for CAD and systolic HF. He was started on ASA, atorvastatin, Losartan and Coreg. Coumadin started for apical thrombus w/ heparin bridge.   Post cath, there were initial concerns for developing cardiogenic shock but he remained stable and did not require pressor/inotropic support. Co-ox remained stable. Repeat echo showed further reduction of LVEF down to 30-35% but no post MI mechanical complications. RV mildly reduced. He did require IV Lasix for pulmonary edema and volume status and dyspnea improved w/ diuresis. He was continued on GTDMT w/ losartan, spironolactone and Coreg. BP too soft for Entresto. He was treated w/ colchicine for post MI pericarditis w/ improvement in pleuritic chest pain. Given anterior MI w/ EF <35%, he was fitted w/ a LifeVest prior to discharge w/ plans to get repeat echo in 1 month.   He had repeat echo in 9/21, showing EF 25-30%.  He had [employer] ICD placed in 11/21.  Echo in 8/22 showed EF 30% with no LV thrombus, normal RV, normal IVC, moderate AS with mean gradient 28 mmHg and AVA 1.08 cm^2. Echo in 12/22 showed EF 30-35%, no LV thrombus, possibly bicuspid aortic valve with mean gradient 34 mmHg and AVA 1.02  cm^2, normal RV, IVC normal.   Echo was done in 8/23 showing LV thrombus.  INR goal on warfarin was increased to 2.5-3.5.  Echo 10/23 showed EF remains 25-30% with peri-apical akinesis and a chronic-appearing LV thrombus, severe low flow/low gradient aortic stenosis (mean gradient 27 mmHg with AVA 0.88 cm^2), bicuspid aortic valve, normal RV, normal IVC. INR increased to 3-3.5.  We have discussed barostimulation activation therapy, but Medicaid does not have good coverage for this.   Follow up 10/23, continued with NYHA II-III symptoms. Referred to structural heart team for TAVR work up, underwent L/RHC showing chronic occlusion of mLAD without target and moderate RCA stenosis, severe AS and normal right heart pressures. Decided SAVR was best option with LV thrombus.  Seen in ED 01/11/22 with acute ETOH intoxication and LOC. BP low, given IVF with improvement. Discharged home.  In 1/24, he had AVR with St Jude mechanical AoV.  Post-op, he was taken off dapagliflozin, Entresto, and spironolactone. He quit smoking a couple of weeks before AVR.   Today he returns for followup of CHF.  He has quit smoking again using Chantix and has also quit drinking for the last 3 months. Weight is up about 8 lbs.  He rarely uses prn Lasix.  He has mild dyspnea waling up stairs or carrying a heavy load, no dyspnea walking on flat ground.  No orthopnea/PND.  No lightheadedness, no chest pain.   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   Labs (9/21):  K 4.3, creatinine 0.93 Labs (11/21): LDL 67, HDL 37, K 4.4, creatinine 1.61 Labs (1/22): digoxin 0.5, K 4, creatinine 1.1.  Labs (5/22): digoxin 0.9, K 4.4, creatinine 1.06 Labs (8/22): K 4.3, creatinine 0.91, TGs 343, LDL 65, HDL 31, digoxin 1.0 Labs (09/60): K 4.1, creatinine 1.14, pro-BNP 120 Labs (1/23): hgb 14.7, LDL 66, TGs 99 Labs (5/23): digoxin  0.3, K 4.3, creatinine 1.17 Labs (8/23): digoxin 0.2, K 4.5, creatinine 1.17 Labs (12/23): K 5.3, creatinine 1.07, hgb 15.6, LDL 36 Labs (1/24): K 3.9, creatinine 1.08 Labs (3/24): K 4.2, SCr 1.18 Labs (5/24): K 4.6, creatinine 1.04, hgb 11.9  PMH: 1. GERD 2. Depression  3. CAD: Anterior STEMI in 8/21 with totally occluded LAD/no collaterals, 40% mid RCA.  Due to late presentation, no PCI was done.  - Had post-infarct pericarditis.  - LHC (11/23): mLAD 100% stenosed, pRCA 50% stenosed 4. Chronic systolic CHF: Ischemic cardiomyopathy. [employer] ICD.  - Echo (9/21) with EF 25-30%, normal RV, moderate AS with mean gradient 22 mmHg.  - Echo (8/22): EF 30% with no LV thrombus, normal RV, normal IVC, moderate AS with mean gradient 28 mmHg and AVA 1.08 cm^2.  - Echo (12/22): EF 30-35%, no LV thrombus, possibly bicuspid aortic valve with mean gradient 34 mmHg and AVA 1.02 cm^2, normal RV, IVC normal.  - Echo (10/23): EF remains 25-30% with peri-apical akinesis and a chronic-appearing LV thrombus, severe low flow/low gradient aortic stenosis (mean gradient 27 mmHg with AVA 0.88 cm^2), bicuspid aortic valve, normal RV, normal IVC. - R/LHC (11/23): mLAD 100% stenosed, pRCA 50% stenosed; RA mean 3, PA 25/14 mean 18, PCWP 13, CO/CI (Fick) 5.03/2.54 - TEE 1/24 EF 20-30%, normal RV 5. LV thrombus: Apical thrombus noted on echo at time of MI in 8/21. Resolved by 9/21 echo.  - Echo 10/23 with chronic-appearing, immobile LV thrombus.  6. Aortic stenosis: Bicuspid valve. Moderate on 12/22 echo.  - 10/23 echo showed low flow/low gradient severe AS.   - St Jude mechanical AVR in 1/24 7. Prior smoker.   Current Outpatient Medications  Medication Sig Dispense Refill   acetaminophen (TYLENOL) 500 MG tablet Take 500 mg by mouth every 6 (six) hours as needed for headache.     aspirin 81 MG chewable tablet Chew 1 tablet (81 mg total) by mouth daily. 90 tablet 3   atorvastatin (LIPITOR) 80 MG tablet Take  1 tablet (80 mg total) by mouth daily. 30 tablet 11   carvedilol (COREG) 6.25 MG tablet Take 1 tablet (6.25 mg total) by mouth 2 (two) times daily. 60 tablet 6   dapagliflozin propanediol (FARXIGA) 10 MG TABS tablet Take 1 tablet (10 mg total) by mouth daily before breakfast. 30 tablet 11   digoxin (LANOXIN) 0.125 MG tablet Take 1/2 tablet (0.0625 mg total) by mouth daily. 30 tablet 6   ezetimibe (ZETIA) 10 MG tablet Take 1 tablet (10 mg total) by mouth daily. 90 tablet 3   fenofibrate 160 MG tablet Take 1 tablet (160 mg total) by mouth daily. 30 tablet 3   furosemide (LASIX) 20 MG tablet Take 1 tablet (20 mg total) by mouth daily. 30 tablet 6   furosemide (LASIX) 20 MG tablet Take 1 tablet (20 mg total) by mouth daily as needed for fluid or edema. 30 tablet 11   hydrOXYzine (ATARAX) 25 MG tablet Take 1 tablet (25 mg total) by mouth 3 (three) times daily as needed. 90 tablet 3   melatonin 5 MG TABS Take 2  tablets (10 mg total) by mouth at bedtime. 30 tablet 3   mirtazapine (REMERON) 45 MG tablet Take 1 tablet (45 mg total) by mouth at bedtime. 30 tablet 3   Multiple Vitamin (MULTIVITAMIN WITH MINERALS) TABS tablet Take 1 tablet by mouth daily.     omega-3 acid ethyl esters (LOVAZA) 1 g capsule TAKE 2 CAPSULES (2 GRAMS TOTAL) BY MOUTH TWO TIMES DAILY. 120 capsule 3   omeprazole (PRILOSEC) 20 MG capsule Take 20 mg by mouth daily before breakfast.     Probiotic Product (PROBIOTIC DAILY PO) Take 1 capsule by mouth with breakfast, with lunch, and with evening meal.     sacubitril-valsartan (ENTRESTO) 97-103 MG Take 1 tablet by mouth 2 (two) times daily. 60 tablet 11   sertraline (ZOLOFT) 100 MG tablet Take 2 tablets (200 mg total) by mouth daily. 60 tablet 3   SILDENAFIL CITRATE PO Take 45-90 mg by mouth daily as needed (ED). Take 1 - 2 tablets 30 to 45 min before sexual activity as needed     spironolactone (ALDACTONE) 50 MG tablet Take 1 tablet (50 mg total) by mouth daily. 30 tablet 6   varenicline  (CHANTIX) 1 MG tablet Take 2 mg by mouth 2 (two) times daily.     warfarin (COUMADIN) 5 MG tablet Take 1.5 tablets to 2 tablets by mouth once daily as directed by Anticoagulation clinic 55 tablet 3   No current facility-administered medications for this visit.   No Known Allergies  Social History   Socioeconomic History   Marital status: Married    Spouse name: Not on file   Number of children: 0   Years of education: Not on file   Highest education level: Not on file  Occupational History   Occupation: Clinical research associate   Occupation: Attorney/Writer  Tobacco Use   Smoking status: Former    Current packs/day: 0.00    Types: Cigarettes    Quit date: 02/15/2022    Years since quitting: 0.6   Smokeless tobacco: Never   Tobacco comments:    x15 yrs as of 2021  Vaping Use   Vaping status: Never Used  Substance and Sexual Activity   Alcohol use: Not Currently    Comment: a couple of drinks on the weekends   Drug use: Never   Sexual activity: Yes    Birth control/protection: None  Other Topics Concern   Not on file  Social History Narrative   2 adopted children   Social Determinants of Health   Financial Resource Strain: Not on file  Food Insecurity: No Food Insecurity (02/20/2022)   Hunger Vital Sign    Worried About Running Out of Food in the Last Year: Never true    Ran Out of Food in the Last Year: Never true  Transportation Needs: No Transportation Needs (02/20/2022)   PRAPARE - Administrator, Civil Service (Medical): No    Lack of Transportation (Non-Medical): No  Physical Activity: Not on file  Stress: Not on file  Social Connections: Not on file  Intimate Partner Violence: Not At Risk (02/20/2022)   Humiliation, Afraid, Rape, and Kick questionnaire    Fear of Current or Ex-Partner: No    Emotionally Abused: No    Physically Abused: No    Sexually Abused: No   Family History  Problem Relation Age of Onset   Cancer Mother        peritoneal   Arrhythmia  Father    Prostate cancer Father  Colon cancer Brother 10   Cancer Maternal Grandmother        type unknown   Heart attack Maternal Grandfather    Diabetes Paternal Grandmother    Heart attack Paternal Grandfather    There were no vitals taken for this visit.  Wt Readings from Last 3 Encounters:  10/09/22 92.9 kg (204 lb 12.9 oz)  09/16/22 91.4 kg (201 lb 6.4 oz)  06/20/22 87.8 kg (193 lb 9.6 oz)   PHYSICAL EXAM: General: NAD Neck: JVP 8 cm, no thyromegaly or thyroid nodule.  Lungs: Clear to auscultation bilaterally with normal respiratory effort. CV: Nondisplaced PMI.  Heart regular S1/S2 with mechanical S2, no S3/S4, no murmur.  No peripheral edema.  No carotid bruit.  Normal pedal pulses.  Abdomen: Soft, nontender, no hepatosplenomegaly, no distention.  Skin: Intact without lesions or rashes.  Neurologic: Alert and oriented x 3.  Psych: Normal affect. Extremities: No clubbing or cyanosis.  HEENT: Normal.   ASSESSMENT & PLAN: 1. CAD: Admission 8/21 for acute anterior MI, delayed presentation with no ischemic pain, had pleuritic-type chest pain (post-MI pericarditis). LHC with totally occluded mid LAD w/o collaterals. Suspected completed MI, no intervention.  Repeat LHC in 11/23 with mLAD 100% stenosed, pRCA 50% stenosed.  No ischemic chest pain.  - Continue ASA 81 daily.   - Continue atorvastatin 80 mg daily and Zetia 10 daily, check lipids today.   2. Chronic systolic CHF: Ischemic cardiomyopathy.  Reduced EF post anterior MI.  Echo in 9/21 with EF 25-30%, peri-apical akinesis, moderate AS. Cath w/ occluded mLAD but no intervention as he had completed his infarct. He has [employer] ICD.  Echo in 8/22 with EF 30%.  Echo in 12/22 with EF 30-35%, normal RV, moderate AS. Echo 10/23 showed EF remains 25-30% with peri-apical akinesis and a chronic-appearing LV thrombus, severe low flow/low gradient aortic stenosis (mean gradient 27 mmHg with AVA 0.88 cm^2), bicuspid aortic  valve, normal RV, normal IVC.  Now s/p mechanical AVR.  TEE 1/24 EF 20-30%, normal RV.  NYHA class II symptoms with weight up and HeartLogic score suggestive of volume overload.  Possible mild volume overload on exam.  - Start Lasix 20 mg daily.  BMET/BNP today and again in 10 days.  - Continue Entresto 97/103 bid. - Continue spironolactone 25 mg daily.  - Continue Coreg 6.25 mg bid.  - Continue dapagliflozin 10 mg daily.  - Continue digoxin 0.0625, check level today.  - I will arrange for echo.  - Would like him to get barostimulation activation therapy device but Medicaid will not cover.   3. 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   4. LV thrombus: Large LV thrombus noted on echo in 8/21, resolved on 9/21 echo.  Thrombus had recurred on 8/23 echo and warfarin was increased to INR goal 2.5-3.5 and ASA 81 was added.  Echo 10/23, there was still an LV thrombus, though it was smaller than in 8/23. Not mobile.  - Continue warfarin with INR goal 2.5-3.5 and continue ASA 81 daily.  5. Tobacco use: He has quit smoking again using Chantix.  6. Hyperlipidemia: Check lipids today.   7. Aortic stenosis: Bicuspid aortic valve with moderate AS on 12/22 echo. Echo in 10/23 showed severe low flow/low gradient aortic stenosis with mean gradient 27 mmHg and AVA 0.88 cm^2.  He had SAVR 02/19/22 with Dr. Leafy Ro with St Jude mechanical aortic valve.  - Needs echo for baseline mechanical valve assessment.  - Continue ASA 81 + warfarin (INR  goal 2.5-3.5).  - Will re-refer for cardiac rehab. He is ready to do it now.  8. Alcohol abuse: He has quit x 3 months.   Follow up with APP in 6 wks.   Anderson Malta [place], FNP  10/27/2022

## 2022-10-28 ENCOUNTER — Ambulatory Visit (HOSPITAL_COMMUNITY)
Admission: RE | Admit: 2022-10-28 | Discharge: 2022-10-28 | Disposition: A | Discharge status: Home / Self Care | Payer: Medicaid Other | Source: Ambulatory Visit | Attending: Family Medicine | Admitting: Family Medicine

## 2022-10-28 ENCOUNTER — Encounter (HOSPITAL_COMMUNITY): Payer: Self-pay

## 2022-10-28 VITALS — BP 120/78 | HR 81 | Wt 205.8 lb

## 2022-10-28 DIAGNOSIS — Z952 Presence of prosthetic heart valve: Secondary | ICD-10-CM | POA: Diagnosis not present

## 2022-10-28 DIAGNOSIS — Q2381 Bicuspid aortic valve: Secondary | ICD-10-CM | POA: Diagnosis not present

## 2022-10-28 DIAGNOSIS — I5022 Chronic systolic (congestive) heart failure: Secondary | ICD-10-CM | POA: Insufficient documentation

## 2022-10-28 DIAGNOSIS — F1021 Alcohol dependence, in remission: Secondary | ICD-10-CM | POA: Insufficient documentation

## 2022-10-28 DIAGNOSIS — I350 Nonrheumatic aortic (valve) stenosis: Secondary | ICD-10-CM | POA: Insufficient documentation

## 2022-10-28 DIAGNOSIS — Z7901 Long term (current) use of anticoagulants: Secondary | ICD-10-CM | POA: Insufficient documentation

## 2022-10-28 DIAGNOSIS — I252 Old myocardial infarction: Secondary | ICD-10-CM | POA: Insufficient documentation

## 2022-10-28 DIAGNOSIS — I241 Dressler's syndrome: Secondary | ICD-10-CM | POA: Diagnosis not present

## 2022-10-28 DIAGNOSIS — Z7982 Long term (current) use of aspirin: Secondary | ICD-10-CM | POA: Diagnosis not present

## 2022-10-28 DIAGNOSIS — Z87891 Personal history of nicotine dependence: Secondary | ICD-10-CM | POA: Insufficient documentation

## 2022-10-28 DIAGNOSIS — Z79899 Other long term (current) drug therapy: Secondary | ICD-10-CM | POA: Diagnosis not present

## 2022-10-28 DIAGNOSIS — F1011 Alcohol abuse, in remission: Secondary | ICD-10-CM

## 2022-10-28 DIAGNOSIS — I2510 Atherosclerotic heart disease of native coronary artery without angina pectoris: Secondary | ICD-10-CM | POA: Insufficient documentation

## 2022-10-28 DIAGNOSIS — E785 Hyperlipidemia, unspecified: Secondary | ICD-10-CM | POA: Diagnosis not present

## 2022-10-28 DIAGNOSIS — I513 Intracardiac thrombosis, not elsewhere classified: Secondary | ICD-10-CM | POA: Diagnosis not present

## 2022-10-28 NOTE — Progress Notes (Signed)
H&V Care Navigation CSW Progress Note  Clinical Social Worker met with pt to discuss disability.  Pt reports he has not worked for 3 years.  Stopped work for non medical reasons but then had a heart attack  and has not felt able to return to work due to long hours his previous job required (50-60/week).  Has not applied for disability during this time.   Patients wife is working and he is getting some money from his father from his inheritance and thinks this is sustainable during application process.    CSW provided with information on how to apply for disability- he feels confident he can apply on his own online.  CSW provided with my contact information in case he needs assistance with remembering dates of hospital stays or doctor office information.  Will continue to follow through HF clinic and assist as needed  Patient is participating in a Managed Medicaid Plan:  Yes  SDOH Screenings   Food Insecurity: No Food Insecurity (02/20/2022)  Housing: Low Risk  (02/20/2022)  Transportation Needs: No Transportation Needs (02/20/2022)  Utilities: Not At Risk (02/20/2022)  Depression (PHQ2-9): Medium Risk (10/09/2022)  Tobacco Use: Medium Risk (10/28/2022)   Burna Sis, LCSW Clinical Social Worker [facility] Desk#: [phone] Cell#: [phone]

## 2022-10-28 NOTE — Patient Instructions (Addendum)
Thank you for coming in today  If you had labs drawn today, any labs that are abnormal the clinic will call you No news is good news  Medications: No changes   Follow up appointments: Your physician recommends that you return for lab work in:  Fasting Lipids blood work please do not eat before this appointment  Your physician recommends that you schedule a follow-up appointment in:  3 months with Dr. Shirlee Latch    Do the following things EVERYDAY: Weigh yourself in the morning before breakfast. Write it down and keep it in a log. Take your medicines as prescribed Eat low salt foods--Limit salt (sodium) to 2000 mg per day.  Stay as active as you can everyday Limit all fluids for the day to less than 2 liters   At the [facility], you and your health needs are our priority. As part of our continuing mission to provide you with exceptional heart care, we have created designated Provider Care Teams. These Care Teams include your primary Cardiologist (physician) and Advanced Practice Providers (APPs- Physician Assistants and Nurse Practitioners) who all work together to provide you with the care you need, when you need it.   You may see any of the following providers on your designated Care Team at your next follow up: Dr Arvilla Meres Dr Marca Ancona Dr. Marcos Eke, NP Robbie Lis, [place] [facility] [place], [place] Brynda Peon, NP Karle Plumber, PharmD   Please be sure to bring in all your medications bottles to every appointment.    Thank you for choosing [facility]  If you have any questions or concerns before your next appointment please send [place] a message through [place] or call our office at [phone].    TO LEAVE A MESSAGE FOR THE NURSE SELECT OPTION 2, PLEASE LEAVE A MESSAGE INCLUDING: YOUR NAME DATE OF BIRTH CALL BACK NUMBER REASON FOR CALL**this is important as we prioritize the  call backs  YOU WILL RECEIVE A CALL BACK THE SAME DAY AS LONG AS YOU CALL BEFORE 4:00 PM

## 2022-10-29 ENCOUNTER — Encounter (HOSPITAL_COMMUNITY)
Admission: RE | Admit: 2022-10-29 | Discharge: 2022-10-29 | Disposition: A | Discharge status: Home / Self Care | Payer: Medicaid Other | Source: Ambulatory Visit | Attending: Cardiology | Admitting: Cardiology

## 2022-10-29 DIAGNOSIS — I429 Cardiomyopathy, unspecified: Secondary | ICD-10-CM | POA: Insufficient documentation

## 2022-10-30 ENCOUNTER — Ambulatory Visit: Payer: Medicaid Other | Attending: Internal Medicine | Admitting: *Deleted

## 2022-10-30 DIAGNOSIS — I513 Intracardiac thrombosis, not elsewhere classified: Secondary | ICD-10-CM | POA: Diagnosis not present

## 2022-10-30 DIAGNOSIS — Z7901 Long term (current) use of anticoagulants: Secondary | ICD-10-CM | POA: Diagnosis not present

## 2022-10-30 DIAGNOSIS — Z5181 Encounter for therapeutic drug level monitoring: Secondary | ICD-10-CM | POA: Diagnosis not present

## 2022-10-30 LAB — POCT INR: INR: 2 (ref 2.0–3.0)

## 2022-10-30 NOTE — Patient Instructions (Signed)
Description   Today take 2 tablets of warfarin then continue taking warfarin 1.5 tablets daily except 2 tablets on Mondays and Fridays.  Recheck INR 3 weeks (normally 6 weeks).  Coumadin Clinic [phone] or [phone]  [place] [id number]

## 2022-10-31 ENCOUNTER — Encounter (HOSPITAL_COMMUNITY)
Admission: RE | Admit: 2022-10-31 | Discharge: 2022-10-31 | Disposition: A | Discharge status: Home / Self Care | Payer: Medicaid Other | Source: Ambulatory Visit | Attending: Cardiology

## 2022-10-31 DIAGNOSIS — I429 Cardiomyopathy, unspecified: Secondary | ICD-10-CM

## 2022-11-03 ENCOUNTER — Encounter (HOSPITAL_COMMUNITY)
Admission: RE | Admit: 2022-11-03 | Discharge: 2022-11-03 | Disposition: A | Discharge status: Home / Self Care | Payer: Medicaid Other | Source: Ambulatory Visit | Attending: Cardiology | Admitting: Cardiology

## 2022-11-03 ENCOUNTER — Other Ambulatory Visit: Payer: Self-pay

## 2022-11-03 DIAGNOSIS — I429 Cardiomyopathy, unspecified: Secondary | ICD-10-CM | POA: Diagnosis not present

## 2022-11-04 ENCOUNTER — Other Ambulatory Visit: Payer: Self-pay

## 2022-11-05 ENCOUNTER — Encounter (HOSPITAL_COMMUNITY): Payer: Medicaid Other

## 2022-11-05 ENCOUNTER — Other Ambulatory Visit: Payer: Self-pay

## 2022-11-06 ENCOUNTER — Other Ambulatory Visit (HOSPITAL_COMMUNITY): Payer: Medicaid Other

## 2022-11-07 ENCOUNTER — Encounter (HOSPITAL_COMMUNITY)
Admission: RE | Admit: 2022-11-07 | Discharge: 2022-11-07 | Disposition: A | Discharge status: Home / Self Care | Payer: Medicaid Other | Source: Ambulatory Visit | Attending: Cardiology | Admitting: Cardiology

## 2022-11-07 DIAGNOSIS — I429 Cardiomyopathy, unspecified: Secondary | ICD-10-CM | POA: Diagnosis not present

## 2022-11-08 ENCOUNTER — Other Ambulatory Visit: Payer: Self-pay | Admitting: Cardiology

## 2022-11-10 ENCOUNTER — Encounter (HOSPITAL_COMMUNITY)
Admission: RE | Admit: 2022-11-10 | Discharge: 2022-11-10 | Disposition: A | Discharge status: Home / Self Care | Payer: Medicaid Other | Source: Ambulatory Visit | Attending: Cardiology | Admitting: Cardiology

## 2022-11-10 ENCOUNTER — Other Ambulatory Visit: Payer: Self-pay

## 2022-11-10 DIAGNOSIS — I429 Cardiomyopathy, unspecified: Secondary | ICD-10-CM

## 2022-11-10 MED ORDER — OMEGA-3-ACID ETHYL ESTERS 1 G PO CAPS
2.0000 g | ORAL_CAPSULE | Freq: Two times a day (BID) | ORAL | 3 refills | Status: DC
  Filled 2022-11-10: qty 120, 30d supply, fill #0
  Filled 2022-12-13: qty 120, 30d supply, fill #1
  Filled 2023-01-11: qty 120, 30d supply, fill #2
  Filled 2023-02-08: qty 120, 30d supply, fill #3

## 2022-11-12 ENCOUNTER — Encounter (HOSPITAL_COMMUNITY)
Admission: RE | Admit: 2022-11-12 | Discharge: 2022-11-12 | Disposition: A | Discharge status: Home / Self Care | Payer: Medicaid Other | Source: Ambulatory Visit | Attending: Cardiology

## 2022-11-12 DIAGNOSIS — I429 Cardiomyopathy, unspecified: Secondary | ICD-10-CM | POA: Diagnosis not present

## 2022-11-14 ENCOUNTER — Encounter (HOSPITAL_COMMUNITY): Payer: Self-pay | Admitting: Psychiatry

## 2022-11-14 ENCOUNTER — Encounter (HOSPITAL_COMMUNITY)
Admission: RE | Admit: 2022-11-14 | Discharge: 2022-11-14 | Disposition: A | Discharge status: Home / Self Care | Payer: Medicaid Other | Source: Ambulatory Visit | Attending: Cardiology | Admitting: Cardiology

## 2022-11-14 ENCOUNTER — Other Ambulatory Visit: Payer: Self-pay

## 2022-11-14 ENCOUNTER — Telehealth (INDEPENDENT_AMBULATORY_CARE_PROVIDER_SITE_OTHER): Payer: Medicaid Other | Admitting: Psychiatry

## 2022-11-14 DIAGNOSIS — F32A Depression, unspecified: Secondary | ICD-10-CM | POA: Diagnosis not present

## 2022-11-14 DIAGNOSIS — F411 Generalized anxiety disorder: Secondary | ICD-10-CM

## 2022-11-14 DIAGNOSIS — I429 Cardiomyopathy, unspecified: Secondary | ICD-10-CM | POA: Diagnosis not present

## 2022-11-14 MED ORDER — HYDROXYZINE HCL 25 MG PO TABS
25.0000 mg | ORAL_TABLET | Freq: Three times a day (TID) | ORAL | 3 refills | Status: DC | PRN
  Filled 2022-11-14: qty 90, 30d supply, fill #0

## 2022-11-14 MED ORDER — MELATONIN 5 MG PO TABS
10.0000 mg | ORAL_TABLET | Freq: Every day | ORAL | 3 refills | Status: DC
Start: 2022-11-14 — End: 2022-12-17
  Filled 2022-11-14: qty 30, 15d supply, fill #0

## 2022-11-14 MED ORDER — MIRTAZAPINE 45 MG PO TABS
45.0000 mg | ORAL_TABLET | Freq: Every day | ORAL | 3 refills | Status: DC
Start: 2022-11-14 — End: 2022-12-17
  Filled 2022-11-14 – 2022-11-30 (×2): qty 30, 30d supply, fill #0

## 2022-11-14 NOTE — Progress Notes (Signed)
BH MD/PA/NP OP Progress Note Virtual Visit via Video Note  I connected with Craig Flores on 11/14/22 at 10:00 AM EDT by a video enabled telemedicine application and verified that I am speaking with the correct person using two identifiers.  Location: Patient: Car  Provider: Clinic   I discussed the limitations of evaluation and management by telemedicine and the availability of in person appointments. The patient expressed understanding and agreed to proceed.  I provided 30 minutes of non-face-to-face time during this encounter.       11/14/2022 10:21 AM Craig Flores  MRN:  [id number]  Chief Complaint: I have been having sexual side effects  HPI:  57 year old male seen today for follow up psychiatric evaluation. He has a psychiatric history of alcohol dependence, substance induced mood disorder, anxiety, depression, and SI. He is currently being managed on Zoloft 200mg  daily,  hydroxyzine 25 mg three times daily as needed, and Mirtazapine 45 mg at nightly.  Patient also takes melatonin 5 mg provides it over-the-counter.  He notes that his medications are effective in managing his psychiatric conditions but notes that he has been having sexual side effects.   Today he well pleasant, cooperative, engaged in conversation.  He informed Clinical research associate that he has been having sexual side effects.  He informed Clinical research associate that Viagra is not as effective as it once was and he is unable to maintain an erection.  Provider informed patient that antidepressants can cause sexual side effects and asked him if he wanted to reduce/discontinue one of his antidepressants.  He notes that he was willing to try.  Provider recommended patient tapering off of Zoloft for the next week and a half.  He endorsed understanding and agreed.  Provider informed patient that if his depression worsens Trintellix could be trialed.    Since his last visit he notes that his anxiety and depression are well-managed.  Provider  conducted a GAD-7 and he is 10, at his last visit he scored a 9.  Provider also conducted a PHQ-9 and patient scored a 9, at her last visit he scored a 7.  He endorses adequate sleep and appetite.  Today he denies SI/HI/VH, mania, paranoia.    Patient informed Clinical research associate that he has been going to physical therapy to strengthen his heart.  He continues to have congestive heart failure but notes that it is better managed.  Patient also informed Clinical research associate that things at home are peaceful.  He continues to be proud to his son who has 2 positions.  He notes that his wife is doing well.  Today patient agreeable to reducing Zoloft 200 mg over the next week and a half.  Patient notes that if he feels well he will discontinue Zoloft to reduce his sexual side effects.  He was informed that Trintellix can be trialed if his depression and anxiety exacerbates.  He endorsed understanding and agreed.  He will continue all of his other medications as prescribed.  No other concerns noted at this time.   Visit Diagnosis:    ICD-10-CM   1. Mild depression  F32.A mirtazapine (REMERON) 45 MG tablet    2. Generalized anxiety disorder  F41.1 mirtazapine (REMERON) 45 MG tablet    hydrOXYzine (ATARAX) 25 MG tablet        Past Psychiatric History: alcohol dependence, substance induced mood disorder, anxiety, depression, and SI.   Past Medical History:  Past Medical History:  Diagnosis Date   Acid reflux    AICD (automatic cardioverter/defibrillator) present  Anxiety    Aortic stenosis with bicuspid valve    CHF (congestive heart failure) (HCC)    Depression    Dyspnea    GERD (gastroesophageal reflux disease)    HLD (hyperlipidemia)    Ischemic cardiomyopathy    Migraines    Myocardial infarction (HCC) 08/28/2019    Past Surgical History:  Procedure Laterality Date   AORTIC VALVE REPLACEMENT N/A 02/19/2022   Procedure: AORTIC VALVE REPLACEMENT (AVR) 23mm SJM REGENT MECHANICAL AORTIC VALVE;  Surgeon: Eugenio Hoes, MD;  Location: [facility] OR;  Service: Open Heart Surgery;  Laterality: N/A;   COLONOSCOPY WITH PROPOFOL N/A 07/16/2021   Procedure: COLONOSCOPY WITH PROPOFOL;  Surgeon: Iva Boop, MD;  Location: [facility]COPY;  Service: Gastroenterology;  Laterality: N/A;   CORONARY/GRAFT ACUTE MI REVASCULARIZATION N/A 08/28/2019   Procedure: Coronary/Graft Acute MI Revascularization;  Surgeon: Runell Gess, MD;  Location: [facility] INVASIVE CV LAB;  Service: Cardiovascular;  Laterality: N/A;   ICD IMPLANT N/A 11/28/2019   Procedure: ICD IMPLANT;  Surgeon: Duke Salvia, MD;  Location: [facility] INVASIVE CV LAB;  Service: Cardiovascular;  Laterality: N/A;   LEFT HEART CATH AND CORONARY ANGIOGRAPHY N/A 08/28/2019   Procedure: LEFT HEART CATH AND CORONARY ANGIOGRAPHY;  Surgeon: Runell Gess, MD;  Location: [facility] INVASIVE CV LAB;  Service: Cardiovascular;  Laterality: N/A;   POLYPECTOMY  07/16/2021   Procedure: POLYPECTOMY;  Surgeon: Iva Boop, MD;  Location: [facility];  Service: Gastroenterology;;   RIGHT HEART CATH AND CORONARY ANGIOGRAPHY N/A 12/12/2021   Procedure: RIGHT HEART CATH AND CORONARY ANGIOGRAPHY;  Surgeon: Kathleene Hazel, MD;  Location: [facility] INVASIVE CV LAB;  Service: Cardiovascular;  Laterality: N/A;   TEE WITHOUT CARDIOVERSION N/A 02/19/2022   Procedure: TRANSESOPHAGEAL ECHOCARDIOGRAM (TEE);  Surgeon: Eugenio Hoes, MD;  Location: [facility] OR;  Service: Open Heart Surgery;  Laterality: N/A;   WISDOM TOOTH EXTRACTION      Family Psychiatric History: Notes that his mother and aunt had mental health issues. Notes he belives they had anxiety  Family History:  Family History  Problem Relation Age of Onset   Cancer Mother        peritoneal   Arrhythmia Father    Prostate cancer Father    Colon cancer Brother 42   Cancer Maternal Grandmother        type unknown   Heart attack Maternal Grandfather    Diabetes Paternal Grandmother    Heart attack Paternal Grandfather     Social  History:  Social History   Socioeconomic History   Marital status: Married    Spouse name: Not on file   Number of children: 0   Years of education: Not on file   Highest education level: Not on file  Occupational History   Occupation: Clinical research associate   Occupation: Attorney/Writer  Tobacco Use   Smoking status: Former    Current packs/day: 0.00    Types: Cigarettes    Quit date: 02/15/2022    Years since quitting: 0.7   Smokeless tobacco: Never   Tobacco comments:    x15 yrs as of 2021  Vaping Use   Vaping status: Never Used  Substance and Sexual Activity   Alcohol use: Not Currently    Comment: a couple of drinks on the weekends   Drug use: Never   Sexual activity: Yes    Birth control/protection: None  Other Topics Concern   Not on file  Social History Narrative   2 adopted children   Social Determinants of  Health   Financial Resource Strain: Not on file  Food Insecurity: No Food Insecurity (02/20/2022)   Hunger Vital Sign    Worried About Running Out of Food in the Last Year: Never true    Ran Out of Food in the Last Year: Never true  Transportation Needs: No Transportation Needs (02/20/2022)   PRAPARE - Administrator, Civil Service (Medical): No    Lack of Transportation (Non-Medical): No  Physical Activity: Not on file  Stress: Not on file  Social Connections: Not on file    Allergies: No Known Allergies  Metabolic Disorder Labs: Lab Results  Component Value Date   HGBA1C 5.8 (H) 02/17/2022   MPG 119.76 02/17/2022   MPG 119.76 08/28/2019   No results found for: "PROLACTIN" Lab Results  Component Value Date   CHOL 165 09/16/2022   TRIG 368 (H) 09/16/2022   HDL 45 09/16/2022   CHOLHDL 3.7 09/16/2022   VLDL 74 (H) 09/16/2022   LDLCALC 46 09/16/2022   LDLCALC 36 01/13/2022   Lab Results  Component Value Date   TSH 3.181 08/28/2019    Therapeutic Level Labs: No results found for: "LITHIUM" No results found for: "VALPROATE" No results found  for: "CBMZ"  Current Medications: Current Outpatient Medications  Medication Sig Dispense Refill   acetaminophen (TYLENOL) 500 MG tablet Take 500 mg by mouth every 6 (six) hours as needed for headache.     aspirin 81 MG chewable tablet Chew 1 tablet (81 mg total) by mouth daily. 90 tablet 3   atorvastatin (LIPITOR) 80 MG tablet Take 1 tablet (80 mg total) by mouth daily. 30 tablet 11   carvedilol (COREG) 6.25 MG tablet Take 1 tablet (6.25 mg total) by mouth 2 (two) times daily. 60 tablet 6   dapagliflozin propanediol (FARXIGA) 10 MG TABS tablet Take 1 tablet (10 mg total) by mouth daily before breakfast. 30 tablet 11   digoxin (LANOXIN) 0.125 MG tablet Take 1/2 tablet (0.0625 mg total) by mouth daily. 30 tablet 6   ezetimibe (ZETIA) 10 MG tablet Take 1 tablet (10 mg total) by mouth daily. 90 tablet 3   fenofibrate 160 MG tablet Take 1 tablet (160 mg total) by mouth daily. 30 tablet 3   furosemide (LASIX) 20 MG tablet Take 1 tablet (20 mg total) by mouth daily. 30 tablet 6   hydrOXYzine (ATARAX) 25 MG tablet Take 1 tablet (25 mg total) by mouth 3 (three) times daily as needed. 90 tablet 3   melatonin 5 MG TABS Take 2 tablets (10 mg total) by mouth at bedtime. 30 tablet 3   mirtazapine (REMERON) 45 MG tablet Take 1 tablet (45 mg total) by mouth at bedtime. 30 tablet 3   Multiple Vitamin (MULTIVITAMIN WITH MINERALS) TABS tablet Take 1 tablet by mouth daily.     omega-3 acid ethyl esters (LOVAZA) 1 g capsule TAKE 2 CAPSULES (2 GRAMS TOTAL) BY MOUTH TWO TIMES DAILY. 120 capsule 3   omeprazole (PRILOSEC) 20 MG capsule Take 20 mg by mouth daily before breakfast.     Probiotic Product (PROBIOTIC DAILY PO) Take 1 capsule by mouth with breakfast, with lunch, and with evening meal.     sacubitril-valsartan (ENTRESTO) 97-103 MG Take 1 tablet by mouth 2 (two) times daily. 60 tablet 11   SILDENAFIL CITRATE PO Take 45-90 mg by mouth daily as needed (ED). Take 1 - 2 tablets 30 to 45 min before sexual activity  as needed     spironolactone (ALDACTONE) 50  MG tablet Take 1 tablet (50 mg total) by mouth daily. 30 tablet 6   warfarin (COUMADIN) 5 MG tablet Take 1.5 tablets to 2 tablets by mouth once daily as directed by Anticoagulation clinic 55 tablet 3   No current facility-administered medications for this visit.     Musculoskeletal: Strength & Muscle Tone: Normal, telehealth visit Gait & Station: normal, telehealth visit Patient leans: N/A  Psychiatric Specialty Exam: Review of Systems  There were no vitals taken for this visit.There is no height or weight on file to calculate BMI.  General Appearance: Well Groomed  Eye Contact:  Good  Speech:  Clear and Coherent and Normal Rate  Volume:  Normal  Mood:  Euthymic  Affect:  Appropriate and Congruent  Thought Process:  Coherent, Goal Directed and Linear  Orientation:  Full (Time, Place, and Person)  Thought Content: WDL and Logical   Suicidal Thoughts:  No  Homicidal Thoughts:  No  Memory:  Immediate;   Good Recent;   Good Remote;   Good  Judgement:  Good  Insight:  Good  Psychomotor Activity:  Normal  Concentration:  Concentration: Good and Attention Span: Good  Recall:  Good  Fund of Knowledge: Good  Language: Good  Akathisia:  No  Handed:  Right  AIMS (if indicated): Not done  Assets:  Communication Skills Desire for Improvement Financial Resources/Insurance Housing Intimacy Social Support  ADL's:  Intact  Cognition: WNL  Sleep:  Good   Screenings: AIMS    Flowsheet Row [facility]  AIMS Total Score 0      AUDIT    Flowsheet Row [facility]  Alcohol Use Disorder Identification Test Final Score (AUDIT) 19      GAD-7    Flowsheet Row Video Visit from 11/14/2022 in [facility] Video Visit from 08/15/2022 in [facility] Video Visit from 06/06/2022 in [facility] Video Visit from 03/26/2022 in [facility] Video Visit from 10/28/2021 in [facility]  Total GAD-7 Score 10 9 15 9 17       PHQ2-9    Flowsheet Row Video Visit from 11/14/2022 in [facility] CARDIAC REHAB PHASE II ORIENTATION from 10/09/2022 in [facility] Video Visit from 08/15/2022 in [facility] Video Visit from 06/06/2022 in [facility] Video Visit from 03/26/2022 in [facility]  PHQ-2 Total Score 3 2 2 4 2   PHQ-9 Total Score 9 7 7 19 7       Flowsheet Row Video Visit from 06/06/2022 in [facility] Admission (Discharged) from 02/19/2022 in [facility] 4E CV SURGICAL PROGRESSIVE CARE Pre-Admission Testing 60 from 02/17/2022 in [facility] PREADMISSION TESTING  C-SSRS RISK CATEGORY Low Risk No Risk No Risk        Assessment and Plan: Patient notes that his medication positively impact his mental health however notes that he has been having sexual side effects.Today patient agreeable to reducing Zoloft 200 mg over the next week and a half.  Patient notes that if he feels well he will discontinue Zoloft to reduce his sexual side effects.  He was informed that Trintellix can be trialed if his depression and anxiety exacerbates.  He endorsed understanding and agreed.  He will continue all of his other medications as prescribed.   1. Generalized anxiety disorder  Continue- mirtazapine (REMERON) 45 MG tablet; Take 1 tablet (45 mg total) by mouth at bedtime.  Dispense: 30 tablet; Refill: 3 Continue- hydrOXYzine (ATARAX) 25 MG tablet; Take 1 tablet (25 mg total) by mouth 3 (three) times daily as needed.  Dispense: 90 tablet; Refill: 3  2. Mild  depression  Continue- mirtazapine (REMERON) 45 MG tablet; Take 1 tablet (45 mg total) by mouth at bedtime.  Dispense: 30 tablet; Refill: 3  Follow up in 1.5 months Follow up with therapy  Shanna Cisco, NP 11/14/2022, 10:21 AM

## 2022-11-16 ENCOUNTER — Other Ambulatory Visit (HOSPITAL_COMMUNITY): Payer: Self-pay | Admitting: Cardiology

## 2022-11-17 ENCOUNTER — Encounter (HOSPITAL_COMMUNITY)
Admission: RE | Admit: 2022-11-17 | Discharge: 2022-11-17 | Disposition: A | Discharge status: Home / Self Care | Payer: Medicaid Other | Source: Ambulatory Visit | Attending: Cardiology | Admitting: Cardiology

## 2022-11-17 ENCOUNTER — Other Ambulatory Visit: Payer: Self-pay

## 2022-11-17 DIAGNOSIS — I429 Cardiomyopathy, unspecified: Secondary | ICD-10-CM | POA: Diagnosis not present

## 2022-11-17 MED ORDER — CARVEDILOL 6.25 MG PO TABS
6.2500 mg | ORAL_TABLET | Freq: Two times a day (BID) | ORAL | 6 refills | Status: DC
  Filled 2022-11-17: qty 60, 30d supply, fill #0
  Filled 2022-12-20: qty 60, 30d supply, fill #1
  Filled 2023-01-17: qty 60, 30d supply, fill #2
  Filled 2023-02-13: qty 60, 30d supply, fill #3
  Filled 2023-03-21: qty 60, 30d supply, fill #4
  Filled 2023-04-20: qty 60, 30d supply, fill #5
  Filled 2023-05-16: qty 60, 30d supply, fill #6

## 2022-11-18 ENCOUNTER — Ambulatory Visit (HOSPITAL_COMMUNITY)
Admission: RE | Admit: 2022-11-18 | Discharge: 2022-11-18 | Disposition: A | Discharge status: Home / Self Care | Payer: Medicaid Other | Source: Ambulatory Visit | Attending: Cardiology | Admitting: Cardiology

## 2022-11-18 DIAGNOSIS — E785 Hyperlipidemia, unspecified: Secondary | ICD-10-CM | POA: Insufficient documentation

## 2022-11-18 DIAGNOSIS — E782 Mixed hyperlipidemia: Secondary | ICD-10-CM | POA: Diagnosis present

## 2022-11-18 LAB — LIPID PANEL
Cholesterol: 102 mg/dL (ref 0–200)
HDL: 37 mg/dL — ABNORMAL LOW (ref >40)
LDL Cholesterol: 44 mg/dL (ref 0–99)
Total CHOL/HDL Ratio: 2.8 {ratio}
Triglycerides: 103 mg/dL (ref <150)
VLDL: 21 mg/dL (ref 0–40)

## 2022-11-19 ENCOUNTER — Telehealth (HOSPITAL_COMMUNITY): Payer: Self-pay | Admitting: *Deleted

## 2022-11-19 ENCOUNTER — Encounter (HOSPITAL_COMMUNITY): Payer: Medicaid Other

## 2022-11-19 NOTE — Progress Notes (Signed)
Cardiac Individual Treatment Plan  Patient Details  Name: Craig Flores MRN: [id number] Date of Birth: Jul 16, 1965 Referring Provider:   Flowsheet Row CARDIAC REHAB PHASE II ORIENTATION from 10/09/2022 in [facility]  Referring Provider Dr. Marca Ancona, MD       Initial Encounter Date:  Flowsheet Row CARDIAC REHAB PHASE II ORIENTATION from 10/09/2022 in [facility]  Date 10/09/22       Visit Diagnosis: Cardiomyopathy, unspecified type (HCC)  Patient's Home Medications on Admission:  Current Outpatient Medications:    acetaminophen (TYLENOL) 500 MG tablet, Take 500 mg by mouth every 6 (six) hours as needed for headache., Disp: , Rfl:    aspirin 81 MG chewable tablet, Chew 1 tablet (81 mg total) by mouth daily., Disp: 90 tablet, Rfl: 3   atorvastatin (LIPITOR) 80 MG tablet, Take 1 tablet (80 mg total) by mouth daily., Disp: 30 tablet, Rfl: 11   carvedilol (COREG) 6.25 MG tablet, Take 1 tablet (6.25 mg total) by mouth 2 (two) times daily., Disp: 60 tablet, Rfl: 6   dapagliflozin propanediol (FARXIGA) 10 MG TABS tablet, Take 1 tablet (10 mg total) by mouth daily before breakfast., Disp: 30 tablet, Rfl: 11   digoxin (LANOXIN) 0.125 MG tablet, Take 1/2 tablet (0.0625 mg total) by mouth daily., Disp: 30 tablet, Rfl: 6   ezetimibe (ZETIA) 10 MG tablet, Take 1 tablet (10 mg total) by mouth daily., Disp: 90 tablet, Rfl: 3   fenofibrate 160 MG tablet, Take 1 tablet (160 mg total) by mouth daily., Disp: 30 tablet, Rfl: 3   furosemide (LASIX) 20 MG tablet, Take 1 tablet (20 mg total) by mouth daily., Disp: 30 tablet, Rfl: 6   hydrOXYzine (ATARAX) 25 MG tablet, Take 1 tablet (25 mg total) by mouth 3 (three) times daily as needed., Disp: 90 tablet, Rfl: 3   melatonin 5 MG TABS, Take 2 tablets (10 mg total) by mouth at bedtime., Disp: 30 tablet, Rfl: 3   mirtazapine (REMERON) 45 MG  tablet, Take 1 tablet (45 mg total) by mouth at bedtime., Disp: 30 tablet, Rfl: 3   Multiple Vitamin (MULTIVITAMIN WITH MINERALS) TABS tablet, Take 1 tablet by mouth daily., Disp: , Rfl:    omega-3 acid ethyl esters (LOVAZA) 1 g capsule, TAKE 2 CAPSULES (2 GRAMS TOTAL) BY MOUTH TWO TIMES DAILY., Disp: 120 capsule, Rfl: 3   omeprazole (PRILOSEC) 20 MG capsule, Take 20 mg by mouth daily before breakfast., Disp: , Rfl:    Probiotic Product (PROBIOTIC DAILY PO), Take 1 capsule by mouth with breakfast, with lunch, and with evening meal., Disp: , Rfl:    sacubitril-valsartan (ENTRESTO) 97-103 MG, Take 1 tablet by mouth 2 (two) times daily., Disp: 60 tablet, Rfl: 11   SILDENAFIL CITRATE PO, Take 45-90 mg by mouth daily as needed (ED). Take 1 - 2 tablets 30 to 45 min before sexual activity as needed, Disp: , Rfl:    spironolactone (ALDACTONE) 50 MG tablet, Take 1 tablet (50 mg total) by mouth daily., Disp: 30 tablet, Rfl: 6   warfarin (COUMADIN) 5 MG tablet, Take 1.5 tablets to 2 tablets by mouth once daily as directed by Anticoagulation clinic, Disp: 55 tablet, Rfl: 3  Past Medical History: Past Medical History:  Diagnosis Date   Acid reflux    AICD (automatic cardioverter/defibrillator) present    Anxiety    Aortic stenosis with bicuspid valve    CHF (  congestive heart failure) (HCC)    Depression    Dyspnea    GERD (gastroesophageal reflux disease)    HLD (hyperlipidemia)    Ischemic cardiomyopathy    Migraines    Myocardial infarction (HCC) 08/28/2019    Tobacco Use: Social History   Tobacco Use  Smoking Status Former   Current packs/day: 0.00   Types: Cigarettes   Quit date: 02/15/2022   Years since quitting: 0.7  Smokeless Tobacco Never  Tobacco Comments   x15 yrs as of 2021    Labs: Review Flowsheet  More data exists      Latest Ref Rng & Units 01/13/2022 02/17/2022 02/19/2022 09/16/2022 11/18/2022  Labs for ITP Cardiac and Pulmonary Rehab  Cholestrol 0 - 200 mg/dL 161  - -  096  045   LDL (calc) 0 - 99 mg/dL 36  - - 46  44   HDL-C >40 mg/dL 36  - - 45  37   Trlycerides <150 mg/dL 409  - - 811  914   Hemoglobin A1c 4.8 - 5.6 % - 5.8  - - -  PH, Arterial 7.35 - 7.45 - 7.44  7.304  7.295  7.290  7.312  7.353  7.259  7.328  7.327  - -  PCO2 arterial 32 - 48 mmHg - 39  45.9  46.7  47.5  47.8  38.4  55.3  45.6  44.1  - -  Bicarbonate 20.0 - 28.0 mmol/L - 26.5  22.8  22.8  23.1  24.2  21.4  24.8  24.8  24.0  23.1  - -  TCO2 22 - 32 mmol/L - - 24  24  25  24  26  23  26  27  26  25  26  24  24   - -  Acid-base deficit 0.0 - 2.0 mmol/L - - 4.0  4.0  4.0  2.0  4.0  3.0  2.0  2.0  3.0  - -  O2 Saturation % - 100  95  97  95  99  100  100  82  100  100  - -    Details       Multiple values from one day are sorted in reverse-chronological order         Capillary Blood Glucose: Lab Results  Component Value Date   GLUCAP 87 02/23/2022   GLUCAP 100 (H) 02/23/2022   GLUCAP 91 02/23/2022   GLUCAP 117 (H) 02/22/2022   GLUCAP 104 (H) 02/22/2022     Exercise Target Goals: Exercise Program Goal: Individual exercise prescription set using results from initial 6 min walk test and THRR while considering  patient's activity barriers and safety.   Exercise Prescription Goal: Initial exercise prescription builds to 30-45 minutes a day of aerobic activity, 2-3 days per week.  Home exercise guidelines will be given to patient during program as part of exercise prescription that the participant will acknowledge.  Activity Barriers & Risk Stratification:  Activity Barriers & Cardiac Risk Stratification - 10/09/22 1527       Activity Barriers & Cardiac Risk Stratification   Activity Barriers History of Falls    Cardiac Risk Stratification High   under 5 MET's on            6 Minute Walk:  6 Minute Walk     Row Name 10/09/22 1522         6 Minute Walk   Phase Initial     Distance 1440  feet     Walk Time 6 minutes     # of Rest Breaks 0     MPH 2.73      METS 2.98     RPE 11     Perceived Dyspnea  0     VO2 Peak 10.44     Symptoms No     Resting HR 77 bpm     Resting BP 112/74     Resting Oxygen Saturation  96 %     Exercise Oxygen Saturation  during 6 min walk 98 %     Max Ex. HR 98 bpm     Max Ex. BP 130/82     2 Minute Post BP 114/74              Oxygen Initial Assessment:   Oxygen Re-Evaluation:   Oxygen Discharge (Final Oxygen Re-Evaluation):   Initial Exercise Prescription:  Initial Exercise Prescription - 10/09/22 1500       Date of Initial Exercise RX and Referring Provider   Date 10/09/22    Referring Provider Dr. Marca Ancona, MD    Expected Discharge Date 12/31/22      Treadmill   MPH 2.4    Grade 0    Minutes 15    METs 2.84      Bike   Level 2    Watts 40    Minutes 30    METs 2.5      Prescription Details   Frequency (times per week) 3    Duration Progress to 30 minutes of continuous aerobic without signs/symptoms of physical distress      Intensity   THRR 40-80% of Max Heartrate 65-131    Ratings of Perceived Exertion 11-13    Perceived Dyspnea 0-4      Progression   Progression Continue progressive overload as per policy without signs/symptoms or physical distress.      Resistance Training   Training Prescription Yes   Will need cuff weight for left hand   Weight 3    Reps 10-15             Perform Capillary Blood Glucose checks as needed.  Exercise Prescription Changes:   Exercise Prescription Changes     Row Name 10/13/22 1036 10/20/22 1042 11/03/22 1032 11/17/22 1027       Response to Exercise   Blood Pressure (Admit) 100/62 92/60 98/62  98/62    Blood Pressure (Exercise) 140/68 122/72 -- --    Blood Pressure (Exit) 102/78 90/60 110/64 112/60    Heart Rate (Admit) 77 bpm 77 bpm 80 bpm 96 bpm    Heart Rate (Exercise) 102 bpm 118 bpm 139 bpm 144 bpm    Heart Rate (Exit) 79 bpm 89 bpm 94 bpm 105 bpm    Rating of Perceived Exertion (Exercise) 11 12 12 12      Symptoms None None None None    Comments Off to a great start with exercise. Reviewed METs with Rudell Cobb. Increased WL on bike and TM. -- Reviewed METs and goals.    Duration Continue with 30 min of aerobic exercise without signs/symptoms of physical distress. Continue with 30 min of aerobic exercise without signs/symptoms of physical distress. Continue with 30 min of aerobic exercise without signs/symptoms of physical distress. Continue with 30 min of aerobic exercise without signs/symptoms of physical distress.    Intensity THRR unchanged THRR unchanged THRR unchanged THRR unchanged      Progression   Progression Continue to progress workloads  to maintain intensity without signs/symptoms of physical distress. Continue to progress workloads to maintain intensity without signs/symptoms of physical distress. Continue to progress workloads to maintain intensity without signs/symptoms of physical distress. Continue to progress workloads to maintain intensity without signs/symptoms of physical distress.    Average METs 3.5 4.1 4.9 5.1      Resistance Training   Training Prescription Yes  cuff weights Yes  cuff weights Yes  cuff weights No  Broken blood vessel in eye, no weights today    Weight 3 3 3  lbs --    Reps 10-15 10-15 10-15 --    Time 10 Minutes 10 Minutes 10 Minutes --      Interval Training   Interval Training No No No No      Treadmill   MPH 2.8 3.2 3.2 3.4    Grade 1 3 5 5     Minutes 16 16 16 16     METs 3.53 4.77 5.66 5.95      Bike   Level 4 8 8 8     Watts 42 -- -- --    Minutes 19 19 19 19     METs 3.4 3.5 4.2 4.3      Home Exercise Plan   Plans to continue exercise at -- -- Home (comment)  Walking Home (comment)  Walking    Frequency -- -- Add 3 additional days to program exercise sessions. Add 3 additional days to program exercise sessions.    Initial Home Exercises Provided -- -- 10/24/22 10/24/22             Exercise Comments:   Exercise Comments     Row Name  10/13/22 1132 10/20/22 1103 10/24/22 1040 11/17/22 1111     Exercise Comments Kent tolerated 1st session of exercise well without symptoms. Introduced to exercise and stretching routine. Reviewed METs with Rudell Cobb. Reviewed home exercise guidelines and goals with [facility]. Reviewed METs and goals with Rudell Cobb.             Exercise Goals and Review:   Exercise Goals     Row Name 10/09/22 1358             Exercise Goals   Increase Physical Activity Yes       Intervention Provide advice, education, support and counseling about physical activity/exercise needs.;Develop an individualized exercise prescription for aerobic and resistive training based on initial evaluation findings, risk stratification, comorbidities and participant's personal goals.       Expected Outcomes Short Term: Attend rehab on a regular basis to increase amount of physical activity.;Long Term: Exercising regularly at least 3-5 days a week.;Long Term: Add in home exercise to make exercise part of routine and to increase amount of physical activity.       Increase Strength and Stamina Yes       Intervention Provide advice, education, support and counseling about physical activity/exercise needs.;Develop an individualized exercise prescription for aerobic and resistive training based on initial evaluation findings, risk stratification, comorbidities and participant's personal goals.       Expected Outcomes Short Term: Increase workloads from initial exercise prescription for resistance, speed, and METs.;Short Term: Perform resistance training exercises routinely during rehab and add in resistance training at home;Long Term: Improve cardiorespiratory fitness, muscular endurance and strength as measured by increased METs and functional capacity ( )       Able to understand and use rate of perceived exertion (RPE) scale Yes       Intervention Provide education and explanation on  how to use RPE scale       Expected Outcomes Short  Term: Able to use RPE daily in rehab to express subjective intensity level;Long Term:  Able to use RPE to guide intensity level when exercising independently       Knowledge and understanding of Target Heart Rate Range (THRR) Yes       Intervention Provide education and explanation of THRR including how the numbers were predicted and where they are located for reference       Expected Outcomes Short Term: Able to state/look up THRR;Short Term: Able to use daily as guideline for intensity in rehab;Long Term: Able to use THRR to govern intensity when exercising independently       Understanding of Exercise Prescription Yes       Intervention Provide education, explanation, and written materials on patient's individual exercise prescription       Expected Outcomes Short Term: Able to explain program exercise prescription;Long Term: Able to explain home exercise prescription to exercise independently                Exercise Goals Re-Evaluation :  Exercise Goals Re-Evaluation     Row Name 10/13/22 1132 10/24/22 1040 11/17/22 1111         Exercise Goal Re-Evaluation   Exercise Goals Review Increase Physical Activity;Increase Strength and Stamina;Able to understand and use rate of perceived exertion (RPE) scale;Knowledge and understanding of Target Heart Rate Range (THRR) Increase Physical Activity;Increase Strength and Stamina;Able to understand and use rate of perceived exertion (RPE) scale;Knowledge and understanding of Target Heart Rate Range (THRR);Understanding of Exercise Prescription Increase Physical Activity;Increase Strength and Stamina;Able to understand and use rate of perceived exertion (RPE) scale;Knowledge and understanding of Target Heart Rate Range (THRR);Understanding of Exercise Prescription     Comments Rudell Cobb was able to understand and use RPE scale appropriately. Reviewed target heart rate range with him. Increased workloads on treadmill and bike. Reviewed exercise prescription  with [facility]. He's not currently exercising at home, but he has a gym membership at [employer] where he can go on his non-cardiac rehab days to exercise. Discussed accumulating 150 minutes of aerobic exercise per week, and he is amenable to this. His goal is to have more energy and to feel better. He also wants to lose weight with a goal weight of 185 lbs. Rudell Cobb states he's feeling better but still working on losing weight. His goal is 185 lbs. He is doing well with exercise at cardiac rehab but starting to exceed THRR. Will request THRR increase.     Expected Outcomes Progress workloads as tolerated to help increase strength and stamina. Rudell Cobb will add 2-4 days aerobic exercise/week an addition to exercise at cardiac rehab to achieve 150 minutes of aerobic exercise/week. Progress workloads as tolerated.              Discharge Exercise Prescription (Final Exercise Prescription Changes):  Exercise Prescription Changes - 11/17/22 1027       Response to Exercise   Blood Pressure (Admit) 98/62    Blood Pressure (Exit) 112/60    Heart Rate (Admit) 96 bpm    Heart Rate (Exercise) 144 bpm    Heart Rate (Exit) 105 bpm    Rating of Perceived Exertion (Exercise) 12    Symptoms None    Comments Reviewed METs and goals.    Duration Continue with 30 min of aerobic exercise without signs/symptoms of physical distress.    Intensity THRR unchanged  Progression   Progression Continue to progress workloads to maintain intensity without signs/symptoms of physical distress.    Average METs 5.1      Resistance Training   Training Prescription No   Broken blood vessel in eye, no weights today     Interval Training   Interval Training No      Treadmill   MPH 3.4    Grade 5    Minutes 16    METs 5.95      Bike   Level 8    Minutes 19    METs 4.3      Home Exercise Plan   Plans to continue exercise at Home (comment)   Walking   Frequency Add 3 additional days to program exercise sessions.     Initial Home Exercises Provided 10/24/22             Nutrition:  Target Goals: Understanding of nutrition guidelines, daily intake of sodium 1500mg , cholesterol 200mg , calories 30% from fat and 7% or less from saturated fats, daily to have 5 or more servings of fruits and vegetables.  Biometrics:  Pre Biometrics - 10/09/22 1523       Pre Biometrics   Waist Circumference 39 inches    Hip Circumference 40.5 inches    Waist to Hip Ratio 0.96 %    Triceps Skinfold 25 mm    % Body Fat 29.6 %    Grip Strength 35 kg    Flexibility 15.75 in    Single Leg Stand 30 seconds              Nutrition Therapy Plan and Nutrition Goals:  Nutrition Therapy & Goals - 11/10/22 1132       Nutrition Therapy   Diet Heart Healthy Diet    Drug/Food Interactions Statins/Certain Fruits      Personal Nutrition Goals   Nutrition Goal Patient to identify strategies for reducing cardiovascular risk by attending the [employer] education and nutrition series weekly.   goal in action.   Personal Goal #2 Patient to improve diet quality by using the plate method as a guide for meal planning to include lean protein/plant protein, fruits, vegetables, whole grains, nonfat dairy as part of a well-balanced diet.   goal in action.   Personal Goal #3 Patient to reduce sodium to 1500mg  per day   goal in action.   Comments Goals in progress. Rudell Cobb continues to attend the [employer] and nutrition series regularly. He has started making many dietary changes including increased low calorie dense foods and increased high fiber foods. Per documentation, he has stopped alcohol ~4 months ago; he continues regular follow-up with behavioal health.  He continues regular follow-up with anti-coagulation clinic. Triglycerides remain elevated; taking fenofibrate. He is down 2# since starting with our program; his goal weight is ~185#.  Patient will benefit from participation in intensive cardiac rehab for nutrition,  exercise, and lifestyle modification.      Intervention Plan   Intervention Prescribe, educate and counsel regarding individualized specific dietary modifications aiming towards targeted core components such as weight, hypertension, lipid management, diabetes, heart failure and other comorbidities.;Nutrition handout(s) given to patient.    Expected Outcomes Short Term Goal: Understand basic principles of dietary content, such as calories, fat, sodium, cholesterol and nutrients.;Long Term Goal: Adherence to prescribed nutrition plan.             Nutrition Assessments:  Nutrition Assessments - 10/13/22 1401       Rate Your Plate Scores  Pre Score 60            MEDIFICTS Score Key: >=70 Need to make dietary changes  40-70 Heart Healthy Diet <= 40 Therapeutic Level Cholesterol Diet   Flowsheet Row CARDIAC REHAB PHASE II EXERCISE from 10/13/2022 in [facility]  Picture Your Plate Total Score on Admission 60      Picture Your Plate Scores: <16 Unhealthy dietary pattern with much room for improvement. 41-50 Dietary pattern unlikely to meet recommendations for good health and room for improvement. 51-60 More healthful dietary pattern, with some room for improvement.  >60 Healthy dietary pattern, although there may be some specific behaviors that could be improved.    Nutrition Goals Re-Evaluation:  Nutrition Goals Re-Evaluation     Row Name 10/13/22 1113 11/10/22 1132           Goals   Current Weight 203 lb 11.3 oz (92.4 kg) 202 lb 13.2 oz (92 kg)      Comment trilglycerides 368 (taking fenofibrate), triglycerides 368 (taking fenofibrate)      Expected Outcome Guillaume reports no nutrition questions/concerns at this time. Per documentation, he has stopped alcohol ~4 months ago; he continues regular follow-up with behavioal health. He continues regular follow-up with anti-coagulation clinic. Trilgycerides remain  elevated; taking fenofibrate. Patient will benefit from participation in intensive cardiac rehab for nutrition, exercise, and lifestyle modification. Goals in progress. Rudell Cobb continues to attend the [employer] and nutrition series regularly. He has started making many dietary changes including increased low calorie dense foods and increased high fiber foods. Per documentation, he has stopped alcohol ~4 months ago; he continues regular follow-up with behavioal health. He continues regular follow-up with anti-coagulation clinic. Triglycerides remain elevated; taking fenofibrate. He is down 2# since starting with our program; his goal weight is ~185#. Patient will benefit from participation in intensive cardiac rehab for nutrition, exercise, and lifestyle modification.               Nutrition Goals Re-Evaluation:  Nutrition Goals Re-Evaluation     Row Name 10/13/22 1113 11/10/22 1132           Goals   Current Weight 203 lb 11.3 oz (92.4 kg) 202 lb 13.2 oz (92 kg)      Comment trilglycerides 368 (taking fenofibrate), triglycerides 368 (taking fenofibrate)      Expected Outcome Nameer reports no nutrition questions/concerns at this time. Per documentation, he has stopped alcohol ~4 months ago; he continues regular follow-up with behavioal health. He continues regular follow-up with anti-coagulation clinic. Trilgycerides remain elevated; taking fenofibrate. Patient will benefit from participation in intensive cardiac rehab for nutrition, exercise, and lifestyle modification. Goals in progress. Rudell Cobb continues to attend the [employer] and nutrition series regularly. He has started making many dietary changes including increased low calorie dense foods and increased high fiber foods. Per documentation, he has stopped alcohol ~4 months ago; he continues regular follow-up with behavioal health. He continues regular follow-up with anti-coagulation clinic. Triglycerides remain elevated; taking  fenofibrate. He is down 2# since starting with our program; his goal weight is ~185#. Patient will benefit from participation in intensive cardiac rehab for nutrition, exercise, and lifestyle modification.               Nutrition Goals Discharge (Final Nutrition Goals Re-Evaluation):  Nutrition Goals Re-Evaluation - 11/10/22 1132       Goals   Current Weight 202 lb 13.2 oz (92 kg)  Comment triglycerides 368 (taking fenofibrate)    Expected Outcome Goals in progress. Rudell Cobb continues to attend the [employer] and nutrition series regularly. He has started making many dietary changes including increased low calorie dense foods and increased high fiber foods. Per documentation, he has stopped alcohol ~4 months ago; he continues regular follow-up with behavioal health. He continues regular follow-up with anti-coagulation clinic. Triglycerides remain elevated; taking fenofibrate. He is down 2# since starting with our program; his goal weight is ~185#. Patient will benefit from participation in intensive cardiac rehab for nutrition, exercise, and lifestyle modification.             Psychosocial: Target Goals: Acknowledge presence or absence of significant depression and/or stress, maximize coping skills, provide positive support system. Participant is able to verbalize types and ability to use techniques and skills needed for reducing stress and depression.  Initial Review & Psychosocial Screening:  Initial Psych Review & Screening - 10/09/22 1323       Initial Review   Current issues with Current Depression;History of Depression;Current Stress Concerns    Source of Stress Concerns Financial    Comments Stess mostly related to finances      Family Dynamics   Good Support System? Yes   child and wife     Barriers   Psychosocial barriers to participate in program The patient should benefit from training in stress management and relaxation.      Screening Interventions    Interventions Encouraged to exercise;To provide support and resources with identified psychosocial needs;Provide feedback about the scores to participant    Expected Outcomes Long Term goal: The participant improves quality of Life and PHQ9 Scores as seen by post scores and/or verbalization of changes;Short Term goal: Identification and review with participant of any Quality of Life or Depression concerns found by scoring the questionnaire.             Quality of Life Scores:  Quality of Life - 10/09/22 1538       Quality of Life   Select Quality of Life      Quality of Life Scores   Health/Function Pre 9.53 %    Socioeconomic Pre 13.93 %    Psych/Spiritual Pre 5.79 %    Family Pre 23.7 %    GLOBAL Pre 11.75 %            Scores of 19 and below usually indicate a poorer quality of life in these areas.  A difference of  2-3 points is a clinically meaningful difference.  A difference of 2-3 points in the total score of the Quality of Life Index has been associated with significant improvement in overall quality of life, self-image, physical symptoms, and general health in studies assessing change in quality of life.  PHQ-9: Review Flowsheet  More data exists      11/14/2022 10/09/2022 08/15/2022 06/06/2022 03/26/2022  Depression screen PHQ 2/9  Decreased Interest  1     Down, Depressed, Hopeless  1     PHQ - 2 Score  2     Altered sleeping  0     Tired, decreased energy  2     Change in appetite  0     Feeling bad or failure about yourself   1     Trouble concentrating  1     Moving slowly or fidgety/restless  1     Suicidal thoughts  0     PHQ-9 Score  7  Difficult doing work/chores - Somewhat difficult       Details       Information is confidential and restricted. Go to Review Flowsheets to unlock data.        Interpretation of Total Score  Total Score Depression Severity:  1-4 = Minimal depression, 5-9 = Mild depression, 10-14 = Moderate depression, 15-19 =  Moderately severe depression, 20-27 = Severe depression   Psychosocial Evaluation and Intervention:   Psychosocial Re-Evaluation:  Psychosocial Re-Evaluation     Row Name 10/13/22 1445 10/16/22 1621 11/18/22 1142         Psychosocial Re-Evaluation   Current issues with Current Depression;History of Depression;Current Stress Concerns Current Depression;History of Depression;Current Stress Concerns Current Depression;History of Depression;Current Stress Concerns     Comments Rocky Link started cardiac rehab on 10/13/22. Rocky Link did not voice any increased concerns or stressors.Pt started cardiac rehab today.  Pt tolerated light exercise without difficulty. VSS, telemetry-Sinus Rhythm, bundle branch, block asymptomatic. Kent started cardiac rehab on 10/13/22. Rocky Link did not voice any increased concerns or stressors. Will review quality of life in the upcoming week Quality of life reviewed on 10/24/22. Rudell Cobb admits to being depressed. Rudell Cobb says the antidepressants he is taking is controlling his depression. Rudell Cobb is not interested in counseling at this time. Rudell Cobb says he has received counseling in the past.Kent says he is dissatisfied with his health and does not think he will live long due to his heart failure diagnosis. Rudell Cobb is currently not able to work. Kent's wife works. Rudell Cobb is interested in applying for disability.  [facility] Social Worker has followed up with the patient regarding this.     Expected Outcomes -- Rudell Cobb will have decreased or controlled stress and depression upon completion of cardiac rehab. Rudell Cobb will have decreased or controlled stress and depression upon completion of cardiac rehab.     Interventions -- Stress management education;Encouraged to attend Cardiac Rehabilitation for the exercise;Relaxation education Stress management education;Encouraged to attend Cardiac Rehabilitation for the exercise;Relaxation education     Continue Psychosocial Services  -- -- Follow up required by  staff       Initial Review   Source of Stress Concerns -- Financial Financial;Retirement/disability;Chronic Illness     Comments -- Will continue to monitor and offer support as needed Will continue to monitor and offer support as needed              Psychosocial Discharge (Final Psychosocial Re-Evaluation):  Psychosocial Re-Evaluation - 11/18/22 1142       Psychosocial Re-Evaluation   Current issues with Current Depression;History of Depression;Current Stress Concerns    Comments Quality of life reviewed on 10/24/22. Rudell Cobb admits to being depressed. Rudell Cobb says the antidepressants he is taking is controlling his depression. Rudell Cobb is not interested in counseling at this time. Rudell Cobb says he has received counseling in the past.Kent says he is dissatisfied with his health and does not think he will live long due to his heart failure diagnosis. Rudell Cobb is currently not able to work. Kent's wife works. Rudell Cobb is interested in applying for disability.  [facility] Social Worker has followed up with the patient regarding this.    Expected Outcomes Rudell Cobb will have decreased or controlled stress and depression upon completion of cardiac rehab.    Interventions Stress management education;Encouraged to attend Cardiac Rehabilitation for the exercise;Relaxation education    Continue Psychosocial Services  Follow up required by staff      Initial Review   Source  of Stress Concerns Financial;Retirement/disability;Chronic Illness    Comments Will continue to monitor and offer support as needed             Vocational Rehabilitation: Provide vocational rehab assistance to qualifying candidates.   Vocational Rehab Evaluation & Intervention:  Vocational Rehab - 10/09/22 1541       Initial Vocational Rehab Evaluation & Intervention   Assessment shows need for Vocational Rehabilitation No   No needs at this time. He will let us know if he needs resources in the future             Education: Education Goals: Education classes will be provided on a weekly basis, covering required topics. Participant will state understanding/return demonstration of topics presented.    Education     Row Name 10/13/22 1500     Education   Cardiac Education Topics Pritikin     Workshops   Educator Exercise Physiologist   Select Psychosocial   Psychosocial Workshop Recognizing and Reducing Stress   Instruction Review Code 1- Verbalizes Understanding   Class Start Time 1148   Class Stop Time 1237   Class Time Calculation (min) 49 min    Row Name 10/15/22 1400     Education   Cardiac Education Topics Pritikin   Orthoptist   Educator Dietitian   Weekly Topic Tasty Appetizers and Snacks   Instruction Review Code 1- Verbalizes Understanding   Class Start Time 1145   Class Stop Time 1225   Class Time Calculation (min) 40 min    Row Name 10/17/22 1200     Education   Cardiac Education Topics Pritikin   Nurse, children's Exercise Physiologist   Select Nutrition   Nutrition Calorie Density   Instruction Review Code 1- Verbalizes Understanding   Class Start Time 1155   Class Stop Time 1235   Class Time Calculation (min) 40 min    Row Name 10/20/22 1300     Education   Cardiac Education Topics Pritikin   [employer]     Workshops   Educator Exercise Physiologist   Select Exercise   Exercise Workshop Exercise Basics: Building Your Action Plan   Instruction Review Code 1- Verbalizes Understanding   Class Start Time 1155   Class Stop Time 1237   Class Time Calculation (min) 42 min    Row Name 10/22/22 1500     Education   Cardiac Education Topics Pritikin   Customer service manager   Weekly Topic Efficiency Cooking - Meals in a Snap   Instruction Review Code 1- Verbalizes Understanding   Class Start Time 1145   Class Stop Time 1224   Class Time  Calculation (min) 39 min    Row Name 10/27/22 1300     Education   Cardiac Education Topics Pritikin   Glass blower/designer Nutrition   Nutrition Workshop Targeting Your Nutrition Priorities   Instruction Review Code 1- Verbalizes Understanding   Class Start Time 1145   Class Stop Time 1225   Class Time Calculation (min) 40 min    Row Name 10/29/22 1200     Education   Cardiac Education Topics Pritikin   Customer service manager   Weekly Topic One-Pot Wonders   Instruction  Review Code 1- Verbalizes Understanding   Class Start Time 1145   Class Stop Time 1225   Class Time Calculation (min) 40 min    Row Name 10/31/22 1200     Education   Cardiac Education Topics Pritikin   Psychologist, forensic General Education   General Education Hypertension and Heart Disease   Instruction Review Code 1- Verbalizes Understanding   Class Start Time 1157   Class Stop Time 1240   Class Time Calculation (min) 43 min    Row Name 11/03/22 1300     Education   Cardiac Education Topics Pritikin   Licensed conveyancer Nutrition   Nutrition Dining Out - Part 1   Instruction Review Code 1- Verbalizes Understanding   Class Start Time 1145   Class Stop Time 1230   Class Time Calculation (min) 45 min    Row Name 11/07/22 1500     Education   Cardiac Education Topics Pritikin   Immunologist Exercise Physiologist   Select Psychosocial   Psychosocial Workshop Focused Goals, Sustainable Changes   Instruction Review Code 1- Verbalizes Understanding   Class Start Time 1145   Class Stop Time 1230   Class Time Calculation (min) 45 min    Row Name 11/10/22 1200     Education   Cardiac Education Topics Pritikin   Chief of Staff Exercise Education   Exercise Education Biomechanial Limitations   Instruction Review Code 1- Verbalizes Understanding   Class Start Time 1155   Class Stop Time 1235   Class Time Calculation (min) 40 min    Row Name 11/12/22 1600     Education   Cardiac Education Topics Pritikin   Customer service manager   Weekly Topic Fast Evening Meals   Instruction Review Code 1- Verbalizes Understanding   Class Start Time 1145   Class Stop Time 1230   Class Time Calculation (min) 45 min    Row Name 11/14/22 1400     Education   Cardiac Education Topics Pritikin   Licensed conveyancer Nutrition   Nutrition Vitamins and Minerals   Instruction Review Code 1- Verbalizes Understanding   Class Start Time 1145   Class Stop Time 1226   Class Time Calculation (min) 41 min    Row Name 11/17/22 1300     Education   Cardiac Education Topics Pritikin   Glass blower/designer Nutrition   Nutrition Workshop Fueling a Forensic psychologist   Instruction Review Code 1- Teaching laboratory technician Start Time 1145   Class Stop Time 1230   Class Time Calculation (min) 45 min            Core Videos: Exercise    Move It!  Clinical staff conducted group or individual video education with verbal and written material and guidebook.  Patient learns the recommended Pritikin exercise program. Exercise with the goal of living a long, healthy life. Some of the health benefits of exercise include controlled diabetes, healthier blood pressure levels, improved cholesterol levels, improved  heart and lung capacity, improved sleep, and better body composition. Everyone should speak with their doctor before starting or changing an exercise routine.  Biomechanical Limitations Clinical staff conducted group or individual video education with verbal and written  material and guidebook.  Patient learns how biomechanical limitations can impact exercise and how we can mitigate and possibly overcome limitations to have an impactful and balanced exercise routine.  Body Composition Clinical staff conducted group or individual video education with verbal and written material and guidebook.  Patient learns that body composition (ratio of muscle mass to fat mass) is a key component to assessing overall fitness, rather than body weight alone. Increased fat mass, especially visceral belly fat, can put [place] at increased risk for metabolic syndrome, type 2 diabetes, heart disease, and even death. It is recommended to combine diet and exercise (cardiovascular and resistance training) to improve your body composition. Seek guidance from your physician and exercise physiologist before implementing an exercise routine.  Exercise Action Plan Clinical staff conducted group or individual video education with verbal and written material and guidebook.  Patient learns the recommended strategies to achieve and enjoy long-term exercise adherence, including variety, self-motivation, self-efficacy, and positive decision making. Benefits of exercise include fitness, good health, weight management, more energy, better sleep, less stress, and overall well-being.  Medical   Heart Disease Risk Reduction Clinical staff conducted group or individual video education with verbal and written material and guidebook.  Patient learns our heart is our most vital organ as it circulates oxygen, nutrients, white blood cells, and hormones throughout the entire body, and carries waste away. Data supports a plant-based eating plan like the Pritikin Program for its effectiveness in slowing progression of and reversing heart disease. The video provides a number of recommendations to address heart disease.   Metabolic Syndrome and Belly Fat  Clinical staff conducted group or individual video education with  verbal and written material and guidebook.  Patient learns what metabolic syndrome is, how it leads to heart disease, and how one can reverse it and keep it from coming back. You have metabolic syndrome if you have 3 of the following 5 criteria: abdominal obesity, high blood pressure, high triglycerides, low HDL cholesterol, and high blood sugar.  Hypertension and Heart Disease Clinical staff conducted group or individual video education with verbal and written material and guidebook.  Patient learns that high blood pressure, or hypertension, is very common in the [place]. Hypertension is largely due to excessive salt intake, but other important risk factors include being overweight, physical inactivity, drinking too much alcohol, smoking, and not eating enough potassium from fruits and vegetables. High blood pressure is a leading risk factor for heart attack, stroke, congestive heart failure, dementia, kidney failure, and premature death. Long-term effects of excessive salt intake include stiffening of the arteries and thickening of heart muscle and organ damage. Recommendations include ways to reduce hypertension and the risk of heart disease.  Diseases of Our Time - Focusing on Diabetes Clinical staff conducted group or individual video education with verbal and written material and guidebook.  Patient learns why the best way to stop diseases of our time is prevention, through food and other lifestyle changes. Medicine (such as prescription pills and surgeries) is often only a Band-Aid on the problem, not a long-term solution. Most common diseases of our time include obesity, type 2 diabetes, hypertension, heart disease, and cancer. The Pritikin Program is recommended and has been proven to help reduce, reverse, and/or prevent the damaging  effects of metabolic syndrome.  Nutrition   Overview of the Pritikin Eating Plan  Clinical staff conducted group or individual video education with verbal  and written material and guidebook.  Patient learns about the Pritikin Eating Plan for disease risk reduction. The Pritikin Eating Plan emphasizes a wide variety of unrefined, minimally-processed carbohydrates, like fruits, vegetables, whole grains, and legumes. Go, Caution, and Stop food choices are explained. Plant-based and lean animal proteins are emphasized. Rationale provided for low sodium intake for blood pressure control, low added sugars for blood sugar stabilization, and low added fats and oils for coronary artery disease risk reduction and weight management.  Calorie Density  Clinical staff conducted group or individual video education with verbal and written material and guidebook.  Patient learns about calorie density and how it impacts the Pritikin Eating Plan. Knowing the characteristics of the food you choose will help you decide whether those foods will lead to weight gain or weight loss, and whether you want to consume more or less of them. Weight loss is usually a side effect of the Pritikin Eating Plan because of its focus on low calorie-dense foods.  Label Reading  Clinical staff conducted group or individual video education with verbal and written material and guidebook.  Patient learns about the Pritikin recommended label reading guidelines and corresponding recommendations regarding calorie density, added sugars, sodium content, and whole grains.  Dining Out - Part 1  Clinical staff conducted group or individual video education with verbal and written material and guidebook.  Patient learns that restaurant meals can be sabotaging because they can be so high in calories, fat, sodium, and/or sugar. Patient learns recommended strategies on how to positively address this and avoid unhealthy pitfalls.  Facts on Fats  Clinical staff conducted group or individual video education with verbal and written material and guidebook.  Patient learns that lifestyle modifications can be just  as effective, if not more so, as many medications for lowering your risk of heart disease. A Pritikin lifestyle can help to reduce your risk of inflammation and atherosclerosis (cholesterol build-up, or plaque, in the artery walls). Lifestyle interventions such as dietary choices and physical activity address the cause of atherosclerosis. A review of the types of fats and their impact on blood cholesterol levels, along with dietary recommendations to reduce fat intake is also included.  Nutrition Action Plan  Clinical staff conducted group or individual video education with verbal and written material and guidebook.  Patient learns how to incorporate Pritikin recommendations into their lifestyle. Recommendations include planning and keeping personal health goals in mind as an important part of their success.  Healthy Mind-Set    Healthy Minds, Bodies, Hearts  Clinical staff conducted group or individual video education with verbal and written material and guidebook.  Patient learns how to identify when they are stressed. Video will discuss the impact of that stress, as well as the many benefits of stress management. Patient will also be introduced to stress management techniques. The way we think, act, and feel has an impact on our hearts.  How Our Thoughts Can Heal Our Hearts  Clinical staff conducted group or individual video education with verbal and written material and guidebook.  Patient learns that negative thoughts can cause depression and anxiety. This can result in negative lifestyle behavior and serious health problems. Cognitive behavioral therapy is an effective method to help control our thoughts in order to change and improve our emotional outlook.  Additional Videos:  Exercise    Improving  Performance  Clinical staff conducted group or individual video education with verbal and written material and guidebook.  Patient learns to use a non-linear approach by alternating intensity  levels and lengths of time spent exercising to help burn more calories and lose more body fat. Cardiovascular exercise helps improve heart health, metabolism, hormonal balance, blood sugar control, and recovery from fatigue. Resistance training improves strength, endurance, balance, coordination, reaction time, metabolism, and muscle mass. Flexibility exercise improves circulation, posture, and balance. Seek guidance from your physician and exercise physiologist before implementing an exercise routine and learn your capabilities and proper form for all exercise.  Introduction to Yoga  Clinical staff conducted group or individual video education with verbal and written material and guidebook.  Patient learns about yoga, a discipline of the coming together of mind, breath, and body. The benefits of yoga include improved flexibility, improved range of motion, better posture and core strength, increased lung function, weight loss, and positive self-image. Yoga's heart health benefits include lowered blood pressure, healthier heart rate, decreased cholesterol and triglyceride levels, improved immune function, and reduced stress. Seek guidance from your physician and exercise physiologist before implementing an exercise routine and learn your capabilities and proper form for all exercise.  Medical   Aging: Enhancing Your Quality of Life  Clinical staff conducted group or individual video education with verbal and written material and guidebook.  Patient learns key strategies and recommendations to stay in good physical health and enhance quality of life, such as prevention strategies, having an advocate, securing a Health Care Proxy and Power of Attorney, and keeping a list of medications and system for tracking them. It also discusses how to avoid risk for bone loss.  Biology of Weight Control  Clinical staff conducted group or individual video education with verbal and written material and guidebook.   Patient learns that weight gain occurs because we consume more calories than we burn (eating more, moving less). Even if your body weight is normal, you may have higher ratios of fat compared to muscle mass. Too much body fat puts you at increased risk for cardiovascular disease, heart attack, stroke, type 2 diabetes, and obesity-related cancers. In addition to exercise, following the Pritikin Eating Plan can help reduce your risk.  Decoding Lab Results  Clinical staff conducted group or individual video education with verbal and written material and guidebook.  Patient learns that lab test reflects one measurement whose values change over time and are influenced by many factors, including medication, stress, sleep, exercise, food, hydration, pre-existing medical conditions, and more. It is recommended to use the knowledge from this video to become more involved with your lab results and evaluate your numbers to speak with your doctor.   Diseases of Our Time - Overview  Clinical staff conducted group or individual video education with verbal and written material and guidebook.  Patient learns that according to the CDC, 50% to 70% of chronic diseases (such as obesity, type 2 diabetes, elevated lipids, hypertension, and heart disease) are avoidable through lifestyle improvements including healthier food choices, listening to satiety cues, and increased physical activity.  Sleep Disorders Clinical staff conducted group or individual video education with verbal and written material and guidebook.  Patient learns how good quality and duration of sleep are important to overall health and well-being. Patient also learns about sleep disorders and how they impact health along with recommendations to address them, including discussing with a physician.  Nutrition  Dining Out - Part 2 Clinical staff conducted group  or individual video education with verbal and written material and guidebook.  Patient learns  how to plan ahead and communicate in order to maximize their dining experience in a healthy and nutritious manner. Included are recommended food choices based on the type of restaurant the patient is visiting.   Fueling a Banker conducted group or individual video education with verbal and written material and guidebook.  There is a strong connection between our food choices and our health. Diseases like obesity and type 2 diabetes are very prevalent and are in large-part due to lifestyle choices. The Pritikin Eating Plan provides plenty of food and hunger-curbing satisfaction. It is easy to follow, affordable, and helps reduce health risks.  Menu Workshop  Clinical staff conducted group or individual video education with verbal and written material and guidebook.  Patient learns that restaurant meals can sabotage health goals because they are often packed with calories, fat, sodium, and sugar. Recommendations include strategies to plan ahead and to communicate with the manager, chef, or server to help order a healthier meal.  Planning Your Eating Strategy  Clinical staff conducted group or individual video education with verbal and written material and guidebook.  Patient learns about the Pritikin Eating Plan and its benefit of reducing the risk of disease. The Pritikin Eating Plan does not focus on calories. Instead, it emphasizes high-quality, nutrient-rich foods. By knowing the characteristics of the foods, we choose, we can determine their calorie density and make informed decisions.  Targeting Your Nutrition Priorities  Clinical staff conducted group or individual video education with verbal and written material and guidebook.  Patient learns that lifestyle habits have a tremendous impact on disease risk and progression. This video provides eating and physical activity recommendations based on your personal health goals, such as reducing LDL cholesterol, losing weight,  preventing or controlling type 2 diabetes, and reducing high blood pressure.  Vitamins and Minerals  Clinical staff conducted group or individual video education with verbal and written material and guidebook.  Patient learns different ways to obtain key vitamins and minerals, including through a recommended healthy diet. It is important to discuss all supplements you take with your doctor.   Healthy Mind-Set    Smoking Cessation  Clinical staff conducted group or individual video education with verbal and written material and guidebook.  Patient learns that cigarette smoking and tobacco addiction pose a serious health risk which affects millions of people. Stopping smoking will significantly reduce the risk of heart disease, lung disease, and many forms of cancer. Recommended strategies for quitting are covered, including working with your doctor to develop a successful plan.  Culinary   Becoming a Set designer conducted group or individual video education with verbal and written material and guidebook.  Patient learns that cooking at home can be healthy, cost-effective, quick, and puts them in control. Keys to cooking healthy recipes will include looking at your recipe, assessing your equipment needs, planning ahead, making it simple, choosing cost-effective seasonal ingredients, and limiting the use of added fats, salts, and sugars.  Cooking - Breakfast and Snacks  Clinical staff conducted group or individual video education with verbal and written material and guidebook.  Patient learns how important breakfast is to satiety and nutrition through the entire day. Recommendations include key foods to eat during breakfast to help stabilize blood sugar levels and to prevent overeating at meals later in the day. Planning ahead is also a key component.  Cooking - Futures trader and  Sides  Clinical staff conducted group or individual video education with verbal and written material and  guidebook.  Patient learns eating strategies to improve overall health, including an approach to cook more at home. Recommendations include thinking of animal protein as a side on your plate rather than center stage and focusing instead on lower calorie dense options like vegetables, fruits, whole grains, and plant-based proteins, such as beans. Making sauces in large quantities to freeze for later and leaving the skin on your vegetables are also recommended to maximize your experience.  Cooking - Healthy Salads and Dressing Clinical staff conducted group or individual video education with verbal and written material and guidebook.  Patient learns that vegetables, fruits, whole grains, and legumes are the foundations of the Pritikin Eating Plan. Recommendations include how to incorporate each of these in flavorful and healthy salads, and how to create homemade salad dressings. Proper handling of ingredients is also covered. Cooking - Soups and [employer] - Soups and Desserts Clinical staff conducted group or individual video education with verbal and written material and guidebook.  Patient learns that Pritikin soups and desserts make for easy, nutritious, and delicious snacks and meal components that are low in sodium, fat, sugar, and calorie density, while high in vitamins, minerals, and filling fiber. Recommendations include simple and healthy ideas for soups and desserts.   Overview     The Pritikin Solution Program Overview Clinical staff conducted group or individual video education with verbal and written material and guidebook.  Patient learns that the results of the Pritikin Program have been documented in more than 100 articles published in peer-reviewed journals, and the benefits include reducing risk factors for (and, in some cases, even reversing) high cholesterol, high blood pressure, type 2 diabetes, obesity, and more! An overview of the three key pillars of the Pritikin Program  will be covered: eating well, doing regular exercise, and having a healthy mind-set.  WORKSHOPS  Exercise: Exercise Basics: Building Your Action Plan Clinical staff led group instruction and group discussion with PowerPoint presentation and patient guidebook. To enhance the learning environment the use of posters, models and videos may be added. At the conclusion of this workshop, patients will comprehend the difference between physical activity and exercise, as well as the benefits of incorporating both, into their routine. Patients will understand the FITT (Frequency, Intensity, Time, and Type) principle and how to use it to build an exercise action plan. In addition, safety concerns and other considerations for exercise and cardiac rehab will be addressed by the presenter. The purpose of this lesson is to promote a comprehensive and effective weekly exercise routine in order to improve patients' overall level of fitness.   Managing Heart Disease: Your Path to a Healthier Heart Clinical staff led group instruction and group discussion with PowerPoint presentation and patient guidebook. To enhance the learning environment the use of posters, models and videos may be added.At the conclusion of this workshop, patients will understand the anatomy and physiology of the heart. Additionally, they will understand how Pritikin's three pillars impact the risk factors, the progression, and the management of heart disease.  The purpose of this lesson is to provide a high-level overview of the heart, heart disease, and how the Pritikin lifestyle positively impacts risk factors.  Exercise Biomechanics Clinical staff led group instruction and group discussion with PowerPoint presentation and patient guidebook. To enhance the learning environment the use of posters, models and videos may be added. Patients will learn how the structural  parts of their bodies function and how these functions impact their daily  activities, movement, and exercise. Patients will learn how to promote a neutral spine, learn how to manage pain, and identify ways to improve their physical movement in order to promote healthy living. The purpose of this lesson is to expose patients to common physical limitations that impact physical activity. Participants will learn practical ways to adapt and manage aches and pains, and to minimize their effect on regular exercise. Patients will learn how to maintain good posture while sitting, walking, and lifting.  Balance Training and Fall Prevention  Clinical staff led group instruction and group discussion with PowerPoint presentation and patient guidebook. To enhance the learning environment the use of posters, models and videos may be added. At the conclusion of this workshop, patients will understand the importance of their sensorimotor skills (vision, proprioception, and the vestibular system) in maintaining their ability to balance as they age. Patients will apply a variety of balancing exercises that are appropriate for their current level of function. Patients will understand the common causes for poor balance, possible solutions to these problems, and ways to modify their physical environment in order to minimize their fall risk. The purpose of this lesson is to teach patients about the importance of maintaining balance as they age and ways to minimize their risk of falling.  WORKSHOPS   Nutrition:  Fueling a Ship broker led group instruction and group discussion with PowerPoint presentation and patient guidebook. To enhance the learning environment the use of posters, models and videos may be added. Patients will review the foundational principles of the Pritikin Eating Plan and understand what constitutes a serving size in each of the food groups. Patients will also learn Pritikin-friendly foods that are better choices when away from home and review make-ahead  meal and snack options. Calorie density will be reviewed and applied to three nutrition priorities: weight maintenance, weight loss, and weight gain. The purpose of this lesson is to reinforce (in a group setting) the key concepts around what patients are recommended to eat and how to apply these guidelines when away from home by planning and selecting Pritikin-friendly options. Patients will understand how calorie density may be adjusted for different weight management goals.  Mindful Eating  Clinical staff led group instruction and group discussion with PowerPoint presentation and patient guidebook. To enhance the learning environment the use of posters, models and videos may be added. Patients will briefly review the concepts of the Pritikin Eating Plan and the importance of low-calorie dense foods. The concept of mindful eating will be introduced as well as the importance of paying attention to internal hunger signals. Triggers for non-hunger eating and techniques for dealing with triggers will be explored. The purpose of this lesson is to provide patients with the opportunity to review the basic principles of the Pritikin Eating Plan, discuss the value of eating mindfully and how to measure internal cues of hunger and fullness using the Hunger Scale. Patients will also discuss reasons for non-hunger eating and learn strategies to use for controlling emotional eating.  Targeting Your Nutrition Priorities Clinical staff led group instruction and group discussion with PowerPoint presentation and patient guidebook. To enhance the learning environment the use of posters, models and videos may be added. Patients will learn how to determine their genetic susceptibility to disease by reviewing their family history. Patients will gain insight into the importance of diet as part of an overall healthy lifestyle in mitigating the impact  of genetics and other environmental insults. The purpose of this lesson is to  provide patients with the opportunity to assess their personal nutrition priorities by looking at their family history, their own health history and current risk factors. Patients will also be able to discuss ways of prioritizing and modifying the Pritikin Eating Plan for their highest risk areas  Menu  Clinical staff led group instruction and group discussion with PowerPoint presentation and patient guidebook. To enhance the learning environment the use of posters, models and videos may be added. Using menus brought in from [employer], or printed from [employer], patients will apply the Pritikin dining out guidelines that were presented in the [employer] video. Patients will also be able to practice these guidelines in a variety of provided scenarios. The purpose of this lesson is to provide patients with the opportunity to practice hands-on learning of the Pritikin Dining Out guidelines with actual menus and practice scenarios.  Label Reading Clinical staff led group instruction and group discussion with PowerPoint presentation and patient guidebook. To enhance the learning environment the use of posters, models and videos may be added. Patients will review and discuss the Pritikin label reading guidelines presented in Pritikin's Label Reading Educational series video. Using fool labels brought in from local grocery stores and markets, patients will apply the label reading guidelines and determine if the packaged food meet the Pritikin guidelines. The purpose of this lesson is to provide patients with the opportunity to review, discuss, and practice hands-on learning of the Pritikin Label Reading guidelines with actual packaged food labels. Cooking School  Pritikin's [employer] are designed to teach patients ways to prepare quick, simple, and affordable recipes at home. The importance of nutrition's role in chronic disease risk reduction is reflected in its  emphasis in the overall Pritikin program. By learning how to prepare essential core Pritikin Eating Plan recipes, patients will increase control over what they eat; be able to customize the flavor of foods without the use of added salt, sugar, or fat; and improve the quality of the food they consume. By learning a set of core recipes which are easily assembled, quickly prepared, and affordable, patients are more likely to prepare more healthy foods at home. These workshops focus on convenient breakfasts, simple entres, side dishes, and desserts which can be prepared with minimal effort and are consistent with nutrition recommendations for cardiovascular risk reduction. Cooking [employer] are taught by a Armed forces logistics/support/administrative officer (RD) who has been trained by the [employer]. The chef or RD has a clear understanding of the importance of minimizing - if not completely eliminating - added fat, sugar, and sodium in recipes. Throughout the series of Cooking School Workshop sessions, patients will learn about healthy ingredients and efficient methods of cooking to build confidence in their capability to prepare    Cooking School weekly topics:  Adding Flavor- Sodium-Free  Fast and Healthy Breakfasts  Powerhouse Plant-Based Proteins  Satisfying Salads and Dressings  Simple Sides and Sauces  International Cuisine-Spotlight on the [employer] Zones  Delicious Desserts  Savory Soups  [employer] - Meals in a Astronomer Appetizers and Snacks  Comforting Weekend Breakfasts  One-Pot Wonders   Fast Evening Meals  Landscape architect Your Pritikin Plate  WORKSHOPS   Healthy Mindset (Psychosocial):  Focused Goals, Sustainable Changes Clinical staff led group instruction and group discussion with PowerPoint presentation and patient guidebook. To enhance the learning environment the use  of posters, models and videos may be added. Patients will be able to apply effective  goal setting strategies to establish at least one personal goal, and then take consistent, meaningful action toward that goal. They will learn to identify common barriers to achieving personal goals and develop strategies to overcome them. Patients will also gain an understanding of how our mind-set can impact our ability to achieve goals and the importance of cultivating a positive and growth-oriented mind-set. The purpose of this lesson is to provide patients with a deeper understanding of how to set and achieve personal goals, as well as the tools and strategies needed to overcome common obstacles which may arise along the way.  From Head to Heart: The Power of a Healthy Outlook  Clinical staff led group instruction and group discussion with PowerPoint presentation and patient guidebook. To enhance the learning environment the use of posters, models and videos may be added. Patients will be able to recognize and describe the impact of emotions and mood on physical health. They will discover the importance of self-care and explore self-care practices which may work for them. Patients will also learn how to utilize the 4 C's to cultivate a healthier outlook and better manage stress and challenges. The purpose of this lesson is to demonstrate to patients how a healthy outlook is an essential part of maintaining good health, especially as they continue their cardiac rehab journey.  Healthy Sleep for a Healthy Heart Clinical staff led group instruction and group discussion with PowerPoint presentation and patient guidebook. To enhance the learning environment the use of posters, models and videos may be added. At the conclusion of this workshop, patients will be able to demonstrate knowledge of the importance of sleep to overall health, well-being, and quality of life. They will understand the symptoms of, and treatments for, common sleep disorders. Patients will also be able to identify daytime and nighttime  behaviors which impact sleep, and they will be able to apply these tools to help manage sleep-related challenges. The purpose of this lesson is to provide patients with a general overview of sleep and outline the importance of quality sleep. Patients will learn about a few of the most common sleep disorders. Patients will also be introduced to the concept of "sleep hygiene," and discover ways to self-manage certain sleeping problems through simple daily behavior changes. Finally, the workshop will motivate patients by clarifying the links between quality sleep and their goals of heart-healthy living.   Recognizing and Reducing Stress Clinical staff led group instruction and group discussion with PowerPoint presentation and patient guidebook. To enhance the learning environment the use of posters, models and videos may be added. At the conclusion of this workshop, patients will be able to understand the types of stress reactions, differentiate between acute and chronic stress, and recognize the impact that chronic stress has on their health. They will also be able to apply different coping mechanisms, such as reframing negative self-talk. Patients will have the opportunity to practice a variety of stress management techniques, such as deep abdominal breathing, progressive muscle relaxation, and/or guided imagery.  The purpose of this lesson is to educate patients on the role of stress in their lives and to provide healthy techniques for coping with it.  Learning Barriers/Preferences:  Learning Barriers/Preferences - 10/09/22 1539       Learning Barriers/Preferences   Learning Barriers Sight;Exercise Concerns   he wears glasses, history of falls   Learning Preferences Audio;Computer/Internet;Group Instruction;Individual Instruction;Pictoral;Skilled Demonstration;Verbal Instruction;Video;Written Material  Education Topics:  Knowledge Questionnaire Score:  Knowledge Questionnaire Score -  10/09/22 1523       Knowledge Questionnaire Score   Pre Score 24/24             Core Components/Risk Factors/Patient Goals at Admission:  Personal Goals and Risk Factors at Admission - 10/09/22 1358       Core Components/Risk Factors/Patient Goals on Admission    Weight Management Yes    Intervention Weight Management: Develop a combined nutrition and exercise program designed to reach desired caloric intake, while maintaining appropriate intake of nutrient and fiber, sodium and fats, and appropriate energy expenditure required for the weight goal.;Weight Management: Provide education and appropriate resources to help participant work on and attain dietary goals.    Expected Outcomes Short Term: Continue to assess and modify interventions until short term weight is achieved;Long Term: Adherence to nutrition and physical activity/exercise program aimed toward attainment of established weight goal;Understanding recommendations for meals to include 15-35% energy as protein, 25-35% energy from fat, 35-60% energy from carbohydrates, less than 200mg  of dietary cholesterol, 20-35 gm of total fiber daily;Understanding of distribution of calorie intake throughout the day with the consumption of 4-5 meals/snacks    Heart Failure Yes    Intervention Provide a combined exercise and nutrition program that is supplemented with education, support and counseling about heart failure. Directed toward relieving symptoms such as shortness of breath, decreased exercise tolerance, and extremity edema.    Expected Outcomes Short term: Attendance in program 2-3 days a week with increased exercise capacity. Reported lower sodium intake. Reported increased fruit and vegetable intake. Reports medication compliance.;Short term: Daily weights obtained and reported for increase. Utilizing diuretic protocols set by physician.;Long term: Adoption of self-care skills and reduction of barriers for early signs and symptoms  recognition and intervention leading to self-care maintenance.;Improve functional capacity of life    Hypertension Yes    Intervention Provide education on lifestyle modifcations including regular physical activity/exercise, weight management, moderate sodium restriction and increased consumption of fresh fruit, vegetables, and low fat dairy, alcohol moderation, and smoking cessation.;Monitor prescription use compliance.    Expected Outcomes Short Term: Continued assessment and intervention until BP is < 140/52mm HG in hypertensive participants. < 130/41mm HG in hypertensive participants with diabetes, heart failure or chronic kidney disease.;Long Term: Maintenance of blood pressure at goal levels.    Lipids Yes    Intervention Provide education and support for participant on nutrition & aerobic/resistive exercise along with prescribed medications to achieve LDL 70mg , HDL >40mg .    Expected Outcomes Short Term: Participant states understanding of desired cholesterol values and is compliant with medications prescribed. Participant is following exercise prescription and nutrition guidelines.;Long Term: Cholesterol controlled with medications as prescribed, with individualized exercise RX and with personalized nutrition plan. Value goals: LDL < 70mg , HDL > 40 mg.    Stress Yes    Intervention Offer individual and/or small group education and counseling on adjustment to heart disease, stress management and health-related lifestyle change. Teach and support self-help strategies.;Refer participants experiencing significant psychosocial distress to appropriate mental health specialists for further evaluation and treatment. When possible, include family members and significant others in education/counseling sessions.    Expected Outcomes Short Term: Participant demonstrates changes in health-related behavior, relaxation and other stress management skills, ability to obtain effective social support, and compliance  with psychotropic medications if prescribed.;Long Term: Emotional wellbeing is indicated by absence of clinically significant psychosocial distress or social isolation.  Core Components/Risk Factors/Patient Goals Review:   Goals and Risk Factor Review     Row Name 10/13/22 1525 10/16/22 1624 11/18/22 1144         Core Components/Risk Factors/Patient Goals Review   Personal Goals Review Weight Management/Obesity;Heart Failure;Hypertension;Lipids;Stress Weight Management/Obesity;Heart Failure;Hypertension;Lipids;Stress Weight Management/Obesity;Heart Failure;Hypertension;Lipids;Stress     Review Rocky Link started cardiac rehab on 10/13/22. Rocky Link did well with exercise. Vital signs were stable. Rocky Link started cardiac rehab on 10/13/22. Rocky Link is off to a good start to  exercise. Vital signs have been stable. Rocky Link is doing well with exercise at cardiac rehab  Vital signs have been stable. Rudell Cobb has lost 1.4 kg since starting the program     Expected Outcomes Rocky Link will continue to participate in cardiac rehab for exercise, nutriiton and lifestyle modifications. Rocky Link will continue to participate in cardiac rehab for exercise, nutriiton and lifestyle modifications. Rocky Link will continue to participate in cardiac rehab for exercise, nutriiton and lifestyle modifications.              Core Components/Risk Factors/Patient Goals at Discharge (Final Review):   Goals and Risk Factor Review - 11/18/22 1144       Core Components/Risk Factors/Patient Goals Review   Personal Goals Review Weight Management/Obesity;Heart Failure;Hypertension;Lipids;Stress    Review Rocky Link is doing well with exercise at cardiac rehab  Vital signs have been stable. Rudell Cobb has lost 1.4 kg since starting the program    Expected Outcomes Rocky Link will continue to participate in cardiac rehab for exercise, nutriiton and lifestyle modifications.             ITP Comments:  ITP Comments     Row Name 10/09/22 1357 10/13/22 1439 10/16/22  1619 11/18/22 1140     ITP Comments Dr. Armanda Magic medical director. Introduction to pritikin education/ intensive cardiac rehab. Initial orientation packet reviewed with patient. 30 Day ITP comments. Rocky Link started cardiac rehab on 10/13/22. Rocky Link did well with exercise. 30 Day ITP comments. [place] started cardiac rehab on 10/13/22. Rudell Cobb is off to a good start to exercise. 30 Day ITP comments. Rudell Cobb has good attendance and participaition in  cardiac rehab             Comments: See ITP Comments

## 2022-11-19 NOTE — Telephone Encounter (Signed)
-----   Message from Marca Ancona sent at 11/19/2022  1:15 PM EDT ----- Regarding: RE: Exercise heart rate range increase That would be fine. ----- Message ----- From: Artist Pais Sent: 11/19/2022   7:31 AM EDT To: Laurey Morale, MD Subject: Exercise heart rate range increase             Greetings Dr. Shirlee Latch,  Craig Flores has been in rehab approximately 5 weeks and is doing well.  Blood pressure is within normal limits, but exercise heart rates are beginning to exceed Target Heart Rate Range(THRR) of 65-131 bpm (40%-80% of age predicted max HR).  If no GXT is planned for the near future and MD agrees, request to increase THR to 40% -90% of age predicted max HR 65-147 bpm.   Thank you, Artist Pais, MS, ACSM CEP

## 2022-11-20 ENCOUNTER — Ambulatory Visit: Payer: Medicaid Other | Attending: Cardiology | Admitting: *Deleted

## 2022-11-20 DIAGNOSIS — I513 Intracardiac thrombosis, not elsewhere classified: Secondary | ICD-10-CM | POA: Diagnosis not present

## 2022-11-20 DIAGNOSIS — Z5181 Encounter for therapeutic drug level monitoring: Secondary | ICD-10-CM | POA: Diagnosis not present

## 2022-11-20 DIAGNOSIS — Z7901 Long term (current) use of anticoagulants: Secondary | ICD-10-CM | POA: Diagnosis not present

## 2022-11-20 LAB — POCT INR: INR: 2.5 (ref 2.0–3.0)

## 2022-11-20 NOTE — Patient Instructions (Addendum)
Description   Today take 2 tablets of warfarin and tomorrow take 2.5 tablets of warfarin then START taking warfarin 1.5 tablets daily except 2 tablets on Mondays, Wednesdays,and Fridays.  Recheck INR 3 weeks (normally 6 weeks).  [facility] [phone] or [phone]  BILL [id number]

## 2022-11-21 ENCOUNTER — Encounter (HOSPITAL_COMMUNITY): Payer: Medicaid Other

## 2022-11-24 ENCOUNTER — Other Ambulatory Visit: Payer: Self-pay

## 2022-11-24 ENCOUNTER — Encounter (HOSPITAL_COMMUNITY)
Admission: RE | Admit: 2022-11-24 | Discharge: 2022-11-24 | Disposition: A | Discharge status: Home / Self Care | Payer: Medicaid Other | Source: Ambulatory Visit | Attending: Cardiology

## 2022-11-24 DIAGNOSIS — I429 Cardiomyopathy, unspecified: Secondary | ICD-10-CM | POA: Diagnosis not present

## 2022-11-26 ENCOUNTER — Telehealth (HOSPITAL_COMMUNITY): Payer: Self-pay | Admitting: *Deleted

## 2022-11-26 ENCOUNTER — Encounter (HOSPITAL_COMMUNITY)
Admission: RE | Admit: 2022-11-26 | Discharge: 2022-11-26 | Disposition: A | Discharge status: Home / Self Care | Payer: Medicaid Other | Source: Ambulatory Visit | Attending: Cardiology

## 2022-11-26 DIAGNOSIS — I429 Cardiomyopathy, unspecified: Secondary | ICD-10-CM | POA: Diagnosis not present

## 2022-11-26 NOTE — Progress Notes (Addendum)
Patient reported feeling lightheaded the room was spinning and blurred vision. Patient felt like he was going to pass out. Exercise stopped. Patient was placed in a chair. Feet elevated. Blood pressure 92/60. Heart rate 107. Oxygen saturation 97% on room air. Telemetry rhythm Sinus tach. Recheck blood pressure 117/72. Medications reviewed taking as prescribed. Patient says he has not taken any sildenafil in weeks. Patient said he ate breakfast. Given orange juice and a club cracker. Will notify on site provider Neila Gear NP. Patient was doing a 3.4/50 on the treadmill at the time of his symptoms. Onsite provider Robin Searing NP notified. f orthostatics are positive we will advise to follow with AHF clinic. Duiring recent visit he endorsed lightheadedness and dizziness  15 mins 109/74 sitting standing 108/73  symptoms resolved  12 mins ED Gaston Islam., NP okay good he can proceed   12 mins I told him to call the heart failure clinic if he has any more symptoms is he okay to return on Friday? I will advise him not to do any incline on the treadmill when he comes back  thanks again  10 mins ED Gaston Islam., NP yes I think he will be fine to come back on Friday   Will call the heart failure clinic not notify and forward today's vital signs to [facility] NP.Thayer Headings RN BSN

## 2022-11-26 NOTE — Telephone Encounter (Signed)
-----   Message from Jacklynn Ganong sent at 11/26/2022  1:19 PM EDT ----- Thanks for head's up. No change to meds for now. OK to continue with cardiac rehab ----- Message ----- From: Cammy Copa, RN Sent: 11/26/2022  11:53 AM EDT To: Jacklynn Ganong, FNP  Good afternoon Shanda Bumps Mr Coral Else became very lightheaded on the treadmill today at cardiac rehab. Please see attached vital signs and ECG tracings.  Mr Coral Else says he is taking his medications as prescirbed. Orthostatic BP's were wnl after hydration given  Gladstone Lighter RN Cardiac Rehab

## 2022-11-27 ENCOUNTER — Ambulatory Visit (HOSPITAL_COMMUNITY)
Admission: RE | Admit: 2022-11-27 | Discharge: 2022-11-27 | Disposition: A | Discharge status: Home / Self Care | Payer: Medicaid Other | Source: Ambulatory Visit | Attending: Cardiology | Admitting: Cardiology

## 2022-11-27 DIAGNOSIS — I083 Combined rheumatic disorders of mitral, aortic and tricuspid valves: Secondary | ICD-10-CM | POA: Diagnosis not present

## 2022-11-27 DIAGNOSIS — I252 Old myocardial infarction: Secondary | ICD-10-CM | POA: Insufficient documentation

## 2022-11-27 DIAGNOSIS — Z86718 Personal history of other venous thrombosis and embolism: Secondary | ICD-10-CM | POA: Insufficient documentation

## 2022-11-27 DIAGNOSIS — F1010 Alcohol abuse, uncomplicated: Secondary | ICD-10-CM | POA: Insufficient documentation

## 2022-11-27 DIAGNOSIS — E785 Hyperlipidemia, unspecified: Secondary | ICD-10-CM | POA: Diagnosis not present

## 2022-11-27 DIAGNOSIS — I219 Acute myocardial infarction, unspecified: Secondary | ICD-10-CM | POA: Diagnosis not present

## 2022-11-27 DIAGNOSIS — I5022 Chronic systolic (congestive) heart failure: Secondary | ICD-10-CM | POA: Insufficient documentation

## 2022-11-27 DIAGNOSIS — Z9889 Other specified postprocedural states: Secondary | ICD-10-CM | POA: Insufficient documentation

## 2022-11-27 LAB — ECHOCARDIOGRAM COMPLETE
AR max vel: 1.65 cm2
AV Area VTI: 1.82 cm2
AV Area mean vel: 1.63 cm2
AV Mean grad: 8.5 mm[Hg]
AV Peak grad: 16 mm[Hg]
Ao pk vel: 2 m/s
Area-P 1/2: 4.01 cm2
S' Lateral: 3.8 cm

## 2022-11-28 ENCOUNTER — Encounter (HOSPITAL_COMMUNITY)
Admission: RE | Admit: 2022-11-28 | Discharge: 2022-11-28 | Disposition: A | Discharge status: Home / Self Care | Payer: Medicaid Other | Source: Ambulatory Visit | Attending: Cardiology | Admitting: Cardiology

## 2022-11-28 DIAGNOSIS — Z5189 Encounter for other specified aftercare: Secondary | ICD-10-CM | POA: Insufficient documentation

## 2022-11-28 DIAGNOSIS — I429 Cardiomyopathy, unspecified: Secondary | ICD-10-CM | POA: Diagnosis not present

## 2022-12-01 ENCOUNTER — Encounter (HOSPITAL_COMMUNITY)
Admission: RE | Admit: 2022-12-01 | Discharge: 2022-12-01 | Disposition: A | Discharge status: Home / Self Care | Payer: Medicaid Other | Source: Ambulatory Visit | Attending: Cardiology

## 2022-12-01 ENCOUNTER — Other Ambulatory Visit: Payer: Self-pay

## 2022-12-01 DIAGNOSIS — I429 Cardiomyopathy, unspecified: Secondary | ICD-10-CM | POA: Diagnosis not present

## 2022-12-01 DIAGNOSIS — Z5189 Encounter for other specified aftercare: Secondary | ICD-10-CM | POA: Diagnosis not present

## 2022-12-02 ENCOUNTER — Other Ambulatory Visit: Payer: Self-pay

## 2022-12-03 ENCOUNTER — Encounter (HOSPITAL_COMMUNITY)
Admission: RE | Admit: 2022-12-03 | Discharge: 2022-12-03 | Disposition: A | Discharge status: Home / Self Care | Payer: Medicaid Other | Source: Ambulatory Visit | Attending: Cardiology

## 2022-12-03 DIAGNOSIS — I429 Cardiomyopathy, unspecified: Secondary | ICD-10-CM

## 2022-12-03 DIAGNOSIS — Z5189 Encounter for other specified aftercare: Secondary | ICD-10-CM | POA: Diagnosis not present

## 2022-12-05 ENCOUNTER — Encounter (HOSPITAL_COMMUNITY)
Admission: RE | Admit: 2022-12-05 | Discharge: 2022-12-05 | Disposition: A | Discharge status: Home / Self Care | Payer: Medicaid Other | Source: Ambulatory Visit | Attending: Cardiology

## 2022-12-05 DIAGNOSIS — I429 Cardiomyopathy, unspecified: Secondary | ICD-10-CM

## 2022-12-05 DIAGNOSIS — Z5189 Encounter for other specified aftercare: Secondary | ICD-10-CM | POA: Diagnosis not present

## 2022-12-08 ENCOUNTER — Encounter (HOSPITAL_COMMUNITY)
Admission: RE | Admit: 2022-12-08 | Discharge: 2022-12-08 | Disposition: A | Discharge status: Home / Self Care | Payer: Medicaid Other | Source: Ambulatory Visit | Attending: Cardiology | Admitting: Cardiology

## 2022-12-08 ENCOUNTER — Other Ambulatory Visit: Payer: Self-pay

## 2022-12-08 DIAGNOSIS — Z5189 Encounter for other specified aftercare: Secondary | ICD-10-CM | POA: Diagnosis not present

## 2022-12-08 DIAGNOSIS — I429 Cardiomyopathy, unspecified: Secondary | ICD-10-CM

## 2022-12-10 ENCOUNTER — Encounter (HOSPITAL_COMMUNITY)
Admission: RE | Admit: 2022-12-10 | Discharge: 2022-12-10 | Disposition: A | Discharge status: Home / Self Care | Payer: Medicaid Other | Source: Ambulatory Visit | Attending: Cardiology

## 2022-12-10 DIAGNOSIS — Z5189 Encounter for other specified aftercare: Secondary | ICD-10-CM | POA: Diagnosis not present

## 2022-12-10 DIAGNOSIS — I429 Cardiomyopathy, unspecified: Secondary | ICD-10-CM | POA: Diagnosis not present

## 2022-12-11 ENCOUNTER — Ambulatory Visit: Payer: Medicaid Other | Attending: Cardiology

## 2022-12-11 DIAGNOSIS — I513 Intracardiac thrombosis, not elsewhere classified: Secondary | ICD-10-CM | POA: Diagnosis not present

## 2022-12-11 DIAGNOSIS — Z5181 Encounter for therapeutic drug level monitoring: Secondary | ICD-10-CM

## 2022-12-11 LAB — POCT INR: INR: 2.3 (ref 2.0–3.0)

## 2022-12-11 NOTE — Patient Instructions (Addendum)
Description   Today take 2 tablets of warfarin and tomorrow take 2.5 tablets of warfarin then START taking 2 tablets daily except 1.5 tablets on Thursdays and Saturdays.   Stay consistent with greens and protein drink each week  Recheck INR 2 weeks (normally 6 weeks).  [facility] [phone] BILL [id number]

## 2022-12-12 ENCOUNTER — Encounter (HOSPITAL_COMMUNITY)
Admission: RE | Admit: 2022-12-12 | Discharge: 2022-12-12 | Disposition: A | Discharge status: Home / Self Care | Payer: Medicaid Other | Source: Ambulatory Visit | Attending: Cardiology

## 2022-12-12 DIAGNOSIS — Z5189 Encounter for other specified aftercare: Secondary | ICD-10-CM | POA: Diagnosis not present

## 2022-12-12 DIAGNOSIS — I429 Cardiomyopathy, unspecified: Secondary | ICD-10-CM | POA: Diagnosis not present

## 2022-12-13 ENCOUNTER — Other Ambulatory Visit (HOSPITAL_COMMUNITY): Payer: Self-pay | Admitting: Cardiology

## 2022-12-13 DIAGNOSIS — I513 Intracardiac thrombosis, not elsewhere classified: Secondary | ICD-10-CM

## 2022-12-15 ENCOUNTER — Other Ambulatory Visit: Payer: Self-pay

## 2022-12-15 ENCOUNTER — Encounter (HOSPITAL_COMMUNITY)
Admission: RE | Admit: 2022-12-15 | Discharge: 2022-12-15 | Disposition: A | Discharge status: Home / Self Care | Payer: Medicaid Other | Source: Ambulatory Visit | Attending: Cardiology | Admitting: Cardiology

## 2022-12-15 DIAGNOSIS — Z5189 Encounter for other specified aftercare: Secondary | ICD-10-CM | POA: Diagnosis not present

## 2022-12-15 DIAGNOSIS — I429 Cardiomyopathy, unspecified: Secondary | ICD-10-CM

## 2022-12-16 ENCOUNTER — Other Ambulatory Visit: Payer: Self-pay

## 2022-12-16 MED ORDER — WARFARIN SODIUM 5 MG PO TABS
ORAL_TABLET | ORAL | 1 refills | Status: DC
  Filled 2022-12-16: qty 60, 30d supply, fill #0
  Filled 2023-01-17: qty 60, 30d supply, fill #1

## 2022-12-16 NOTE — Progress Notes (Signed)
Cardiac Individual Treatment Plan  Patient Details  Name: Craig Flores MRN: [id number] Date of Birth: October 27, 1965 Referring Provider:   Flowsheet Row CARDIAC REHAB PHASE II ORIENTATION from 10/09/2022 in [facility] for Heart, Vascular, & Lung Health  Referring Provider Dr. Marca Ancona, MD       Initial Encounter Date:  Flowsheet Row CARDIAC REHAB PHASE II ORIENTATION from 10/09/2022 in [facility] for Heart, Vascular, & Lung Health  Date 10/09/22       Visit Diagnosis: Cardiomyopathy, unspecified type (HCC)  Patient's Home Medications on Admission:  Current Outpatient Medications:    acetaminophen (TYLENOL) 500 MG tablet, Take 500 mg by mouth every 6 (six) hours as needed for headache., Disp: , Rfl:    aspirin 81 MG chewable tablet, Chew 1 tablet (81 mg total) by mouth daily., Disp: 90 tablet, Rfl: 3   atorvastatin (LIPITOR) 80 MG tablet, Take 1 tablet (80 mg total) by mouth daily., Disp: 30 tablet, Rfl: 11   carvedilol (COREG) 6.25 MG tablet, Take 1 tablet (6.25 mg total) by mouth 2 (two) times daily., Disp: 60 tablet, Rfl: 6   dapagliflozin propanediol (FARXIGA) 10 MG TABS tablet, Take 1 tablet (10 mg total) by mouth daily before breakfast., Disp: 30 tablet, Rfl: 11   digoxin (LANOXIN) 0.125 MG tablet, Take 1/2 tablet (0.0625 mg total) by mouth daily., Disp: 30 tablet, Rfl: 6   ezetimibe (ZETIA) 10 MG tablet, Take 1 tablet (10 mg total) by mouth daily., Disp: 90 tablet, Rfl: 3   fenofibrate 160 MG tablet, Take 1 tablet (160 mg total) by mouth daily., Disp: 30 tablet, Rfl: 3   furosemide (LASIX) 20 MG tablet, Take 1 tablet (20 mg total) by mouth daily., Disp: 30 tablet, Rfl: 6   hydrOXYzine (ATARAX) 25 MG tablet, Take 1 tablet (25 mg total) by mouth 3 (three) times daily as needed., Disp: 90 tablet, Rfl: 3   melatonin 5 MG TABS, Take 2 tablets (10 mg total) by mouth at bedtime., Disp: 30 tablet, Rfl: 3   mirtazapine (REMERON) 45 MG  tablet, Take 1 tablet (45 mg total) by mouth at bedtime., Disp: 30 tablet, Rfl: 3   Multiple Vitamin (MULTIVITAMIN WITH MINERALS) TABS tablet, Take 1 tablet by mouth daily., Disp: , Rfl:    omega-3 acid ethyl esters (LOVAZA) 1 g capsule, TAKE 2 CAPSULES (2 GRAMS TOTAL) BY MOUTH TWO TIMES DAILY., Disp: 120 capsule, Rfl: 3   omeprazole (PRILOSEC) 20 MG capsule, Take 20 mg by mouth daily before breakfast., Disp: , Rfl:    Probiotic Product (PROBIOTIC DAILY PO), Take 1 capsule by mouth with breakfast, with lunch, and with evening meal., Disp: , Rfl:    sacubitril-valsartan (ENTRESTO) 97-103 MG, Take 1 tablet by mouth 2 (two) times daily., Disp: 60 tablet, Rfl: 11   SILDENAFIL CITRATE PO, Take 45-90 mg by mouth daily as needed (ED). Take 1 - 2 tablets 30 to 45 min before sexual activity as needed, Disp: , Rfl:    spironolactone (ALDACTONE) 50 MG tablet, Take 1 tablet (50 mg total) by mouth daily., Disp: 30 tablet, Rfl: 6   warfarin (COUMADIN) 5 MG tablet, Take 1.5 tablets to 2 tablets by mouth once daily as directed by Anticoagulation clinic, Disp: 55 tablet, Rfl: 3  Past Medical History: Past Medical History:  Diagnosis Date   Acid reflux    AICD (automatic cardioverter/defibrillator) present    Anxiety    Aortic stenosis with bicuspid valve    CHF (  congestive heart failure) (HCC)    Depression    Dyspnea    GERD (gastroesophageal reflux disease)    HLD (hyperlipidemia)    Ischemic cardiomyopathy    Migraines    Myocardial infarction (HCC) 08/28/2019    Tobacco Use: Social History   Tobacco Use  Smoking Status Former   Current packs/day: 0.00   Types: Cigarettes   Quit date: 02/15/2022   Years since quitting: 0.8  Smokeless Tobacco Never  Tobacco Comments   x15 yrs as of 2021    Labs: Review Flowsheet  More data exists      Latest Ref Rng & Units 01/13/2022 02/17/2022 02/19/2022 09/16/2022 11/18/2022  Labs for ITP Cardiac and Pulmonary Rehab  Cholestrol 0 - 200 mg/dL 829  - -  562  130   LDL (calc) 0 - 99 mg/dL 36  - - 46  44   HDL-C >40 mg/dL 36  - - 45  37   Trlycerides <150 mg/dL 865  - - 784  696   Hemoglobin A1c 4.8 - 5.6 % - 5.8  - - -  PH, Arterial 7.35 - 7.45 - 7.44  7.304  7.295  7.290  7.312  7.353  7.259  7.328  7.327  - -  PCO2 arterial 32 - 48 mmHg - 39  45.9  46.7  47.5  47.8  38.4  55.3  45.6  44.1  - -  Bicarbonate 20.0 - 28.0 mmol/L - 26.5  22.8  22.8  23.1  24.2  21.4  24.8  24.8  24.0  23.1  - -  TCO2 22 - 32 mmol/L - - 24  24  25  24  26  23  26  27  26  25  26  24  24   - -  Acid-base deficit 0.0 - 2.0 mmol/L - - 4.0  4.0  4.0  2.0  4.0  3.0  2.0  2.0  3.0  - -  O2 Saturation % - 100  95  97  95  99  100  100  82  100  100  - -    Details       Multiple values from one day are sorted in reverse-chronological order         Capillary Blood Glucose: Lab Results  Component Value Date   GLUCAP 87 02/23/2022   GLUCAP 100 (H) 02/23/2022   GLUCAP 91 02/23/2022   GLUCAP 117 (H) 02/22/2022   GLUCAP 104 (H) 02/22/2022     Exercise Target Goals: Exercise Program Goal: Individual exercise prescription set using results from initial 6 min walk test and THRR while considering  patient's activity barriers and safety.   Exercise Prescription Goal: Initial exercise prescription builds to 30-45 minutes a day of aerobic activity, 2-3 days per week.  Home exercise guidelines will be given to patient during program as part of exercise prescription that the participant will acknowledge.  Activity Barriers & Risk Stratification:  Activity Barriers & Cardiac Risk Stratification - 10/09/22 1527       Activity Barriers & Cardiac Risk Stratification   Activity Barriers History of Falls    Cardiac Risk Stratification High   under 5 MET's on            6 Minute Walk:  6 Minute Walk     Row Name 10/09/22 1522         6 Minute Walk   Phase Initial     Distance 1440  feet     Walk Time 6 minutes     # of Rest Breaks 0     MPH 2.73      METS 2.98     RPE 11     Perceived Dyspnea  0     VO2 Peak 10.44     Symptoms No     Resting HR 77 bpm     Resting BP 112/74     Resting Oxygen Saturation  96 %     Exercise Oxygen Saturation  during 6 min walk 98 %     Max Ex. HR 98 bpm     Max Ex. BP 130/82     2 Minute Post BP 114/74              Oxygen Initial Assessment:   Oxygen Re-Evaluation:   Oxygen Discharge (Final Oxygen Re-Evaluation):   Initial Exercise Prescription:  Initial Exercise Prescription - 10/09/22 1500       Date of Initial Exercise RX and Referring Provider   Date 10/09/22    Referring Provider Dr. Marca Ancona, MD    Expected Discharge Date 12/31/22      Treadmill   MPH 2.4    Grade 0    Minutes 15    METs 2.84      Bike   Level 2    Watts 40    Minutes 30    METs 2.5      Prescription Details   Frequency (times per week) 3    Duration Progress to 30 minutes of continuous aerobic without signs/symptoms of physical distress      Intensity   THRR 40-80% of Max Heartrate 65-131    Ratings of Perceived Exertion 11-13    Perceived Dyspnea 0-4      Progression   Progression Continue progressive overload as per policy without signs/symptoms or physical distress.      Resistance Training   Training Prescription Yes   Will need cuff weight for left hand   Weight 3    Reps 10-15             Perform Capillary Blood Glucose checks as needed.  Exercise Prescription Changes:   Exercise Prescription Changes     Row Name 10/13/22 1036 10/20/22 1042 11/03/22 1032 11/17/22 1027 12/01/22 1034     Response to Exercise   Blood Pressure (Admit) 100/62 92/60 98/62  98/62 98/64   Blood Pressure (Exercise) 140/68 122/72 -- -- --   Blood Pressure (Exit) 102/78 90/60 110/64 112/60 104/58   Heart Rate (Admit) 77 bpm 77 bpm 80 bpm 96 bpm 107 bpm   Heart Rate (Exercise) 102 bpm 118 bpm 139 bpm 144 bpm 125 bpm   Heart Rate (Exit) 79 bpm 89 bpm 94 bpm 105 bpm 96 bpm   Rating of  Perceived Exertion (Exercise) 11 12 12 12 12    Symptoms None None None None None   Comments Off to a great start with exercise. Reviewed METs with Rudell Cobb. Increased WL on bike and TM. -- Reviewed METs and goals. --   Duration Continue with 30 min of aerobic exercise without signs/symptoms of physical distress. Continue with 30 min of aerobic exercise without signs/symptoms of physical distress. Continue with 30 min of aerobic exercise without signs/symptoms of physical distress. Continue with 30 min of aerobic exercise without signs/symptoms of physical distress. Continue with 30 min of aerobic exercise without signs/symptoms of physical distress.   Intensity THRR unchanged THRR unchanged THRR unchanged THRR  unchanged THRR unchanged     Progression   Progression Continue to progress workloads to maintain intensity without signs/symptoms of physical distress. Continue to progress workloads to maintain intensity without signs/symptoms of physical distress. Continue to progress workloads to maintain intensity without signs/symptoms of physical distress. Continue to progress workloads to maintain intensity without signs/symptoms of physical distress. Continue to progress workloads to maintain intensity without signs/symptoms of physical distress.   Average METs 3.5 4.1 4.9 5.1 4     Resistance Training   Training Prescription Yes  cuff weights Yes  cuff weights Yes  cuff weights No  Broken blood vessel in eye, no weights today Yes   Weight 3 3 3  lbs -- 3 lbs  cuff weights   Reps 10-15 10-15 10-15 -- 10-15   Time 10 Minutes 10 Minutes 10 Minutes -- 10 Minutes     Interval Training   Interval Training No No No No No     Treadmill   MPH 2.8 3.2 3.2 3.4 3.4   Grade 1 3 5 5  0   Minutes 16 16 16 16 15    METs 3.53 4.77 5.66 5.95 3.6     Bike   Level 4 8 8 8 2    Watts 42 -- -- -- --   Minutes 19 19 19 19 19    METs 3.4 3.5 4.2 4.3 4.5     Home Exercise Plan   Plans to continue exercise at -- -- Home  (comment)  Walking Home (comment)  Walking Home (comment)  Walking   Frequency -- -- Add 3 additional days to program exercise sessions. Add 3 additional days to program exercise sessions. Add 3 additional days to program exercise sessions.   Initial Home Exercises Provided -- -- 10/24/22 10/24/22 10/24/22    Row Name 12/15/22 1036             Response to Exercise   Blood Pressure (Admit) 114/66       Blood Pressure (Exit) 99/66       Heart Rate (Admit) 92 bpm       Heart Rate (Exercise) 140 bpm       Heart Rate (Exit) 101 bpm       Rating of Perceived Exertion (Exercise) 12       Symptoms None       Comments Reviewed goals with Rudell Cobb. Increased workload on bike.       Duration Continue with 30 min of aerobic exercise without signs/symptoms of physical distress.       Intensity THRR unchanged         Progression   Progression Continue to progress workloads to maintain intensity without signs/symptoms of physical distress.       Average METs 4         Resistance Training   Training Prescription Yes       Weight 3 lbs  cuff weights       Reps 10-15       Time 10 Minutes         Interval Training   Interval Training No         Treadmill   MPH 3.4       Grade 0       Minutes 15       METs 3.6         Bike   Level 10       Minutes 15       METs 5  Home Exercise Plan   Plans to continue exercise at Home (comment)  Walking       Frequency Add 3 additional days to program exercise sessions.       Initial Home Exercises Provided 10/24/22                Exercise Comments:   Exercise Comments     Row Name 10/13/22 1132 10/20/22 1103 10/24/22 1040 11/17/22 1111 12/15/22 1052   Exercise Comments Kent tolerated 1st session of exercise well without symptoms. Introduced to exercise and stretching routine. Reviewed METs with Rudell Cobb. Reviewed home exercise guidelines and goals with [facility]. Reviewed METs and goals with Rudell Cobb. Reviewed goals with [facility].             Exercise Goals and Review:   Exercise Goals     Row Name 10/09/22 1358             Exercise Goals   Increase Physical Activity Yes       Intervention Provide advice, education, support and counseling about physical activity/exercise needs.;Develop an individualized exercise prescription for aerobic and resistive training based on initial evaluation findings, risk stratification, comorbidities and participant's personal goals.       Expected Outcomes Short Term: Attend rehab on a regular basis to increase amount of physical activity.;Long Term: Exercising regularly at least 3-5 days a week.;Long Term: Add in home exercise to make exercise part of routine and to increase amount of physical activity.       Increase Strength and Stamina Yes       Intervention Provide advice, education, support and counseling about physical activity/exercise needs.;Develop an individualized exercise prescription for aerobic and resistive training based on initial evaluation findings, risk stratification, comorbidities and participant's personal goals.       Expected Outcomes Short Term: Increase workloads from initial exercise prescription for resistance, speed, and METs.;Short Term: Perform resistance training exercises routinely during rehab and add in resistance training at home;Long Term: Improve cardiorespiratory fitness, muscular endurance and strength as measured by increased METs and functional capacity ( )       Able to understand and use rate of perceived exertion (RPE) scale Yes       Intervention Provide education and explanation on how to use RPE scale       Expected Outcomes Short Term: Able to use RPE daily in rehab to express subjective intensity level;Long Term:  Able to use RPE to guide intensity level when exercising independently       Knowledge and understanding of Target Heart Rate Range (THRR) Yes       Intervention Provide education and explanation of THRR including how the numbers  were predicted and where they are located for reference       Expected Outcomes Short Term: Able to state/look up THRR;Short Term: Able to use daily as guideline for intensity in rehab;Long Term: Able to use THRR to govern intensity when exercising independently       Understanding of Exercise Prescription Yes       Intervention Provide education, explanation, and written materials on patient's individual exercise prescription       Expected Outcomes Short Term: Able to explain program exercise prescription;Long Term: Able to explain home exercise prescription to exercise independently                Exercise Goals Re-Evaluation :  Exercise Goals Re-Evaluation     Row Name 10/13/22 1132 10/24/22 1040 11/17/22 1111 12/15/22 1052  Exercise Goal Re-Evaluation   Exercise Goals Review Increase Physical Activity;Increase Strength and Stamina;Able to understand and use rate of perceived exertion (RPE) scale;Knowledge and understanding of Target Heart Rate Range (THRR) Increase Physical Activity;Increase Strength and Stamina;Able to understand and use rate of perceived exertion (RPE) scale;Knowledge and understanding of Target Heart Rate Range (THRR);Understanding of Exercise Prescription Increase Physical Activity;Increase Strength and Stamina;Able to understand and use rate of perceived exertion (RPE) scale;Knowledge and understanding of Target Heart Rate Range (THRR);Understanding of Exercise Prescription Increase Physical Activity;Increase Strength and Stamina;Able to understand and use rate of perceived exertion (RPE) scale;Knowledge and understanding of Target Heart Rate Range (THRR);Understanding of Exercise Prescription    Comments Rudell Cobb was able to understand and use RPE scale appropriately. Reviewed target heart rate range with him. Increased workloads on treadmill and bike. Reviewed exercise prescription with [facility]. He's not currently exercising at home, but he has a gym membership at  [employer] where he can go on his non-cardiac rehab days to exercise. Discussed accumulating 150 minutes of aerobic exercise per week, and he is amenable to this. His goal is to have more energy and to feel better. He also wants to lose weight with a goal weight of 185 lbs. Rudell Cobb states he's feeling better but still working on losing weight. His goal is 185 lbs. He is doing well with exercise at cardiac rehab but starting to exceed THRR. Will request THRR increase. Rudell Cobb has started working out at his gym 2 days/week. He is walking th etreadmill 30-35 minutes and using the machine weights.    Expected Outcomes Progress workloads as tolerated to help increase strength and stamina. Rudell Cobb will add 2-4 days aerobic exercise/week an addition to exercise at cardiac rehab to achieve 150 minutes of aerobic exercise/week. Progress workloads as tolerated. Continue to progress workloads as tolerated.             Discharge Exercise Prescription (Final Exercise Prescription Changes):  Exercise Prescription Changes - 12/15/22 1036       Response to Exercise   Blood Pressure (Admit) 114/66    Blood Pressure (Exit) 99/66    Heart Rate (Admit) 92 bpm    Heart Rate (Exercise) 140 bpm    Heart Rate (Exit) 101 bpm    Rating of Perceived Exertion (Exercise) 12    Symptoms None    Comments Reviewed goals with Rudell Cobb. Increased workload on bike.    Duration Continue with 30 min of aerobic exercise without signs/symptoms of physical distress.    Intensity THRR unchanged      Progression   Progression Continue to progress workloads to maintain intensity without signs/symptoms of physical distress.    Average METs 4      Resistance Training   Training Prescription Yes    Weight 3 lbs   cuff weights   Reps 10-15    Time 10 Minutes      Interval Training   Interval Training No      Treadmill   MPH 3.4    Grade 0    Minutes 15    METs 3.6      Bike   Level 10    Minutes 15    METs 5      Home  Exercise Plan   Plans to continue exercise at Home (comment)   Walking   Frequency Add 3 additional days to program exercise sessions.    Initial Home Exercises Provided 10/24/22  Nutrition:  Target Goals: Understanding of nutrition guidelines, daily intake of sodium 1500mg , cholesterol 200mg , calories 30% from fat and 7% or less from saturated fats, daily to have 5 or more servings of fruits and vegetables.  Biometrics:  Pre Biometrics - 10/09/22 1523       Pre Biometrics   Waist Circumference 39 inches    Hip Circumference 40.5 inches    Waist to Hip Ratio 0.96 %    Triceps Skinfold 25 mm    % Body Fat 29.6 %    Grip Strength 35 kg    Flexibility 15.75 in    Single Leg Stand 30 seconds              Nutrition Therapy Plan and Nutrition Goals:  Nutrition Therapy & Goals - 12/11/22 1310       Nutrition Therapy   Diet Heart Healthy Diet    Drug/Food Interactions Statins/Certain Fruits      Personal Nutrition Goals   Nutrition Goal Patient to identify strategies for reducing cardiovascular risk by attending the [employer] education and nutrition series weekly.   goal in action.   Personal Goal #2 Patient to improve diet quality by using the plate method as a guide for meal planning to include lean protein/plant protein, fruits, vegetables, whole grains, nonfat dairy as part of a well-balanced diet.   goal in action.   Personal Goal #3 Patient to reduce sodium to 1500mg  per day   goal in action.   Comments Goals in progress. Rudell Cobb continues to attend the [employer] and nutrition series regularly. He has started making many dietary changes including increased low calorie dense foods and increased high fiber foods. Per documentation, he has stopped alcohol ~4 months ago; he continues regular follow-up with behavioal health. His triglycerides have improved WNL.  He continues regular follow-up with anti-coagulation clinic. He is down 10.3## since starting  with our program; his goal weight is ~185#.  Patient will benefit from participation in intensive cardiac rehab for nutrition, exercise, and lifestyle modification.      Intervention Plan   Intervention Prescribe, educate and counsel regarding individualized specific dietary modifications aiming towards targeted core components such as weight, hypertension, lipid management, diabetes, heart failure and other comorbidities.;Nutrition handout(s) given to patient.    Expected Outcomes Short Term Goal: Understand basic principles of dietary content, such as calories, fat, sodium, cholesterol and nutrients.;Long Term Goal: Adherence to prescribed nutrition plan.             Nutrition Assessments:  Nutrition Assessments - 10/13/22 1401       Rate Your Plate Scores   Pre Score 60            MEDIFICTS Score Key: >=70 Need to make dietary changes  40-70 Heart Healthy Diet <= 40 Therapeutic Level Cholesterol Diet   Flowsheet Row CARDIAC REHAB PHASE II EXERCISE from 10/13/2022 in [facility] for Heart, Vascular, & Lung Health  Picture Your Plate Total Score on Admission 60      Picture Your Plate Scores: <16 Unhealthy dietary pattern with much room for improvement. 41-50 Dietary pattern unlikely to meet recommendations for good health and room for improvement. 51-60 More healthful dietary pattern, with some room for improvement.  >60 Healthy dietary pattern, although there may be some specific behaviors that could be improved.    Nutrition Goals Re-Evaluation:  Nutrition Goals Re-Evaluation     Row Name 10/13/22 1113 11/10/22 1132 12/11/22 1310  Goals   Current Weight 203 lb 11.3 oz (92.4 kg) 202 lb 13.2 oz (92 kg) 194 lb 7.1 oz (88.2 kg)     Comment trilglycerides 368 (taking fenofibrate), triglycerides 368 (taking fenofibrate) HDL 37, LDL 44, Triglycerides WNL     Expected Outcome Mayan reports no nutrition questions/concerns at this time. Per  documentation, he has stopped alcohol ~4 months ago; he continues regular follow-up with behavioal health. He continues regular follow-up with anti-coagulation clinic. Trilgycerides remain elevated; taking fenofibrate. Patient will benefit from participation in intensive cardiac rehab for nutrition, exercise, and lifestyle modification. Goals in progress. Rudell Cobb continues to attend the [employer] and nutrition series regularly. He has started making many dietary changes including increased low calorie dense foods and increased high fiber foods. Per documentation, he has stopped alcohol ~4 months ago; he continues regular follow-up with behavioal health. He continues regular follow-up with anti-coagulation clinic. Triglycerides remain elevated; taking fenofibrate. He is down 2# since starting with our program; his goal weight is ~185#. Patient will benefit from participation in intensive cardiac rehab for nutrition, exercise, and lifestyle modification. Goals in progress. Rudell Cobb continues to attend the [employer] and nutrition series regularly. He has started making many dietary changes including increased low calorie dense foods and increased high fiber foods. Per documentation, he has stopped alcohol ~4 months ago; he continues regular follow-up with behavioal health. His triglycerides have improved WNL. He continues regular follow-up with anti-coagulation clinic. He is down 10.3## since starting with our program; his goal weight is ~185#. Patient will benefit from participation in intensive cardiac rehab for nutrition, exercise, and lifestyle modification.              Nutrition Goals Re-Evaluation:  Nutrition Goals Re-Evaluation     Row Name 10/13/22 1113 11/10/22 1132 12/11/22 1310         Goals   Current Weight 203 lb 11.3 oz (92.4 kg) 202 lb 13.2 oz (92 kg) 194 lb 7.1 oz (88.2 kg)     Comment trilglycerides 368 (taking fenofibrate), triglycerides 368 (taking fenofibrate) HDL 37,  LDL 44, Triglycerides WNL     Expected Outcome Jakyrie reports no nutrition questions/concerns at this time. Per documentation, he has stopped alcohol ~4 months ago; he continues regular follow-up with behavioal health. He continues regular follow-up with anti-coagulation clinic. Trilgycerides remain elevated; taking fenofibrate. Patient will benefit from participation in intensive cardiac rehab for nutrition, exercise, and lifestyle modification. Goals in progress. Rudell Cobb continues to attend the [employer] and nutrition series regularly. He has started making many dietary changes including increased low calorie dense foods and increased high fiber foods. Per documentation, he has stopped alcohol ~4 months ago; he continues regular follow-up with behavioal health. He continues regular follow-up with anti-coagulation clinic. Triglycerides remain elevated; taking fenofibrate. He is down 2# since starting with our program; his goal weight is ~185#. Patient will benefit from participation in intensive cardiac rehab for nutrition, exercise, and lifestyle modification. Goals in progress. Rudell Cobb continues to attend the [employer] and nutrition series regularly. He has started making many dietary changes including increased low calorie dense foods and increased high fiber foods. Per documentation, he has stopped alcohol ~4 months ago; he continues regular follow-up with behavioal health. His triglycerides have improved WNL. He continues regular follow-up with anti-coagulation clinic. He is down 10.3## since starting with our program; his goal weight is ~185#. Patient will benefit from participation in intensive cardiac rehab for nutrition, exercise, and lifestyle modification.  Nutrition Goals Discharge (Final Nutrition Goals Re-Evaluation):  Nutrition Goals Re-Evaluation - 12/11/22 1310       Goals   Current Weight 194 lb 7.1 oz (88.2 kg)    Comment HDL 37, LDL 44, Triglycerides WNL     Expected Outcome Goals in progress. Rudell Cobb continues to attend the [employer] and nutrition series regularly. He has started making many dietary changes including increased low calorie dense foods and increased high fiber foods. Per documentation, he has stopped alcohol ~4 months ago; he continues regular follow-up with behavioal health. His triglycerides have improved WNL. He continues regular follow-up with anti-coagulation clinic. He is down 10.3## since starting with our program; his goal weight is ~185#. Patient will benefit from participation in intensive cardiac rehab for nutrition, exercise, and lifestyle modification.             Psychosocial: Target Goals: Acknowledge presence or absence of significant depression and/or stress, maximize coping skills, provide positive support system. Participant is able to verbalize types and ability to use techniques and skills needed for reducing stress and depression.  Initial Review & Psychosocial Screening:  Initial Psych Review & Screening - 10/09/22 1323       Initial Review   Current issues with Current Depression;History of Depression;Current Stress Concerns    Source of Stress Concerns Financial    Comments Stess mostly related to finances      Family Dynamics   Good Support System? Yes   child and wife     Barriers   Psychosocial barriers to participate in program The patient should benefit from training in stress management and relaxation.      Screening Interventions   Interventions Encouraged to exercise;To provide support and resources with identified psychosocial needs;Provide feedback about the scores to participant    Expected Outcomes Long Term goal: The participant improves quality of Life and PHQ9 Scores as seen by post scores and/or verbalization of changes;Short Term goal: Identification and review with participant of any Quality of Life or Depression concerns found by scoring the questionnaire.              Quality of Life Scores:  Quality of Life - 10/09/22 1538       Quality of Life   Select Quality of Life      Quality of Life Scores   Health/Function Pre 9.53 %    Socioeconomic Pre 13.93 %    Psych/Spiritual Pre 5.79 %    Family Pre 23.7 %    GLOBAL Pre 11.75 %            Scores of 19 and below usually indicate a poorer quality of life in these areas.  A difference of  2-3 points is a clinically meaningful difference.  A difference of 2-3 points in the total score of the Quality of Life Index has been associated with significant improvement in overall quality of life, self-image, physical symptoms, and general health in studies assessing change in quality of life.  PHQ-9: Review Flowsheet  More data exists      11/14/2022 10/09/2022 08/15/2022 06/06/2022 03/26/2022  Depression screen PHQ 2/9  Decreased Interest  1     Down, Depressed, Hopeless  1     PHQ - 2 Score  2     Altered sleeping  0     Tired, decreased energy  2     Change in appetite  0     Feeling bad or failure about yourself   1  Trouble concentrating  1     Moving slowly or fidgety/restless  1     Suicidal thoughts  0     PHQ-9 Score  7     Difficult doing work/chores - Somewhat difficult       Details       Information is confidential and restricted. Go to Review Flowsheets to unlock data.        Interpretation of Total Score  Total Score Depression Severity:  1-4 = Minimal depression, 5-9 = Mild depression, 10-14 = Moderate depression, 15-19 = Moderately severe depression, 20-27 = Severe depression   Psychosocial Evaluation and Intervention:   Psychosocial Re-Evaluation:  Psychosocial Re-Evaluation     Row Name 10/13/22 1445 10/16/22 1621 11/18/22 1142 12/11/22 1354       Psychosocial Re-Evaluation   Current issues with Current Depression;History of Depression;Current Stress Concerns Current Depression;History of Depression;Current Stress Concerns Current Depression;History of  Depression;Current Stress Concerns Current Depression;History of Depression;Current Stress Concerns    Comments Rocky Link started cardiac rehab on 10/13/22. Rocky Link did not voice any increased concerns or stressors.Pt started cardiac rehab today.  Pt tolerated light exercise without difficulty. VSS, telemetry-Sinus Rhythm, bundle branch, block asymptomatic. Kent started cardiac rehab on 10/13/22. Rocky Link did not voice any increased concerns or stressors. Will review quality of life in the upcoming week Quality of life reviewed on 10/24/22. Rudell Cobb admits to being depressed. Rudell Cobb says the antidepressants he is taking is controlling his depression. Rudell Cobb is not interested in counseling at this time. Rudell Cobb says he has received counseling in the past.Kent says he is dissatisfied with his health and does not think he will live long due to his heart failure diagnosis. Rudell Cobb is currently not able to work. Kent's wife works. Rudell Cobb is interested in applying for disability.  The [facility] Social Worker has followed up with the patient regarding this. Rudell Cobb has not voiced any increased concerns or stressors during exercise at cardiac rehab. Rudell Cobb was contacted by the HF social worker about applying for disability    Expected Outcomes -- Rudell Cobb will have decreased or controlled stress and depression upon completion of cardiac rehab. Rudell Cobb will have decreased or controlled stress and depression upon completion of cardiac rehab. Rudell Cobb will have decreased or controlled stress and depression upon completion of cardiac rehab.    Interventions -- Stress management education;Encouraged to attend Cardiac Rehabilitation for the exercise;Relaxation education Stress management education;Encouraged to attend Cardiac Rehabilitation for the exercise;Relaxation education Stress management education;Encouraged to attend Cardiac Rehabilitation for the exercise;Relaxation education    Continue Psychosocial Services  -- -- Follow up required by staff Follow  up required by staff      Initial Review   Source of Stress Concerns -- Financial Financial;Retirement/disability;Chronic Illness Financial;Retirement/disability;Chronic Illness    Comments -- Will continue to monitor and offer support as needed Will continue to monitor and offer support as needed Will continue to monitor and offer support as needed             Psychosocial Discharge (Final Psychosocial Re-Evaluation):  Psychosocial Re-Evaluation - 12/11/22 1354       Psychosocial Re-Evaluation   Current issues with Current Depression;History of Depression;Current Stress Concerns    Comments Rudell Cobb has not voiced any increased concerns or stressors during exercise at cardiac rehab. Rudell Cobb was contacted by the HF social worker about applying for disability    Expected Outcomes Rudell Cobb will have decreased or controlled stress and depression upon completion of cardiac rehab.    Interventions  Stress management education;Encouraged to attend Cardiac Rehabilitation for the exercise;Relaxation education    Continue Psychosocial Services  Follow up required by staff      Initial Review   Source of Stress Concerns Financial;Retirement/disability;Chronic Illness    Comments Will continue to monitor and offer support as needed             Vocational Rehabilitation: Provide vocational rehab assistance to qualifying candidates.   Vocational Rehab Evaluation & Intervention:  Vocational Rehab - 10/09/22 1541       Initial Vocational Rehab Evaluation & Intervention   Assessment shows need for Vocational Rehabilitation No   No needs at this time. He will let us know if he needs resources in the future            Education: Education Goals: Education classes will be provided on a weekly basis, covering required topics. Participant will state understanding/return demonstration of topics presented.    Education     Row Name 10/13/22 1500     Education   Cardiac Education Topics Pritikin      Workshops   Educator Exercise Physiologist   Select Psychosocial   Psychosocial Workshop Recognizing and Reducing Stress   Instruction Review Code 1- Verbalizes Understanding   Class Start Time 1148   Class Stop Time 1237   Class Time Calculation (min) 49 min    Row Name 10/15/22 1400     Education   Cardiac Education Topics Pritikin   Orthoptist   Educator Dietitian   Weekly Topic Tasty Appetizers and Snacks   Instruction Review Code 1- Verbalizes Understanding   Class Start Time 1145   Class Stop Time 1225   Class Time Calculation (min) 40 min    Row Name 10/17/22 1200     Education   Cardiac Education Topics Pritikin   Nurse, children's Exercise Physiologist   Select Nutrition   Nutrition Calorie Density   Instruction Review Code 1- Verbalizes Understanding   Class Start Time 1155   Class Stop Time 1235   Class Time Calculation (min) 40 min    Row Name 10/20/22 1300     Education   Cardiac Education Topics Pritikin   [employer]     Workshops   Educator Exercise Physiologist   Select Exercise   Exercise Workshop Exercise Basics: Building Your Action Plan   Instruction Review Code 1- Verbalizes Understanding   Class Start Time 1155   Class Stop Time 1237   Class Time Calculation (min) 42 min    Row Name 10/22/22 1500     Education   Cardiac Education Topics Pritikin   Customer service manager   Weekly Topic Efficiency Cooking - Meals in a Snap   Instruction Review Code 1- Verbalizes Understanding   Class Start Time 1145   Class Stop Time 1224   Class Time Calculation (min) 39 min    Row Name 10/27/22 1300     Education   Cardiac Education Topics Pritikin   Glass blower/designer Nutrition   Nutrition Workshop Targeting Your Nutrition Priorities   Instruction Review Code 1- Verbalizes Understanding    Class Start Time 1145   Class Stop Time 1225   Class Time Calculation (min) 40 min    Row Name 10/29/22 1200  Education   Cardiac Education Topics Pritikin   Customer service manager   Weekly Topic One-Pot Wonders   Instruction Review Code 1- Verbalizes Understanding   Class Start Time 1145   Class Stop Time 1225   Class Time Calculation (min) 40 min    Row Name 10/31/22 1200     Education   Cardiac Education Topics Pritikin   Hospital doctor Education   General Education Hypertension and Heart Disease   Instruction Review Code 1- Verbalizes Understanding   Class Start Time 1157   Class Stop Time 1240   Class Time Calculation (min) 43 min    Row Name 11/03/22 1300     Education   Cardiac Education Topics Pritikin   Licensed conveyancer Nutrition   Nutrition Dining Out - Part 1   Instruction Review Code 1- Verbalizes Understanding   Class Start Time 1145   Class Stop Time 1230   Class Time Calculation (min) 45 min    Row Name 11/07/22 1500     Education   Cardiac Education Topics Pritikin   Immunologist Exercise Physiologist   Select Psychosocial   Psychosocial Workshop Focused Goals, Sustainable Changes   Instruction Review Code 1- Verbalizes Understanding   Class Start Time 1145   Class Stop Time 1230   Class Time Calculation (min) 45 min    Row Name 11/10/22 1200     Education   Cardiac Education Topics Pritikin   Psychologist, forensic Exercise Education   Exercise Education Biomechanial Limitations   Instruction Review Code 1- Verbalizes Understanding   Class Start Time 1155   Class Stop Time 1235   Class Time Calculation (min) 40 min    Row Name 11/12/22 1600     Education   Cardiac Education  Topics Pritikin   Customer service manager   Weekly Topic Fast Evening Meals   Instruction Review Code 1- Verbalizes Understanding   Class Start Time 1145   Class Stop Time 1230   Class Time Calculation (min) 45 min    Row Name 11/14/22 1400     Education   Cardiac Education Topics Pritikin   Licensed conveyancer Nutrition   Nutrition Vitamins and Minerals   Instruction Review Code 1- Verbalizes Understanding   Class Start Time 1145   Class Stop Time 1226   Class Time Calculation (min) 41 min    Row Name 11/17/22 1300     Education   Cardiac Education Topics Pritikin   Glass blower/designer Nutrition   Nutrition Workshop Fueling a Forensic psychologist   Instruction Review Code 1- Tax inspector   Class Start Time 1145   Class Stop Time 1230   Class Time Calculation (min) 45 min    Row Name 11/24/22 1300     Education   Cardiac Education Topics Pritikin   Geographical information systems officer Psychosocial   Psychosocial Workshop  Healthy Sleep for a Healthy Heart   Instruction Review Code 1- Verbalizes Understanding   Class Start Time 1148   Class Stop Time 1240   Class Time Calculation (min) 52 min    Row Name 11/26/22 1500     Education   Cardiac Education Topics Pritikin   Customer service manager   Weekly Topic Simple Sides and Sauces   Instruction Review Code 1- Verbalizes Understanding   Class Start Time 1145   Class Stop Time 1226   Class Time Calculation (min) 41 min    Row Name 11/28/22 1100     Education   Cardiac Education Topics Pritikin   Nurse, children's Exercise Physiologist   Select Psychosocial   Psychosocial How Our Thoughts Can Heal Our Hearts   Instruction Review Code 1- Verbalizes Understanding    Class Start Time 1148   Class Stop Time 1230   Class Time Calculation (min) 42 min    Row Name 12/01/22 1200     Education   Cardiac Education Topics Pritikin   Select Workshops     Workshops   Educator Exercise Physiologist   Select Exercise   Exercise Workshop Managing Heart Disease: Your Path to a Healthier Heart   Instruction Review Code 1- Verbalizes Understanding   Class Start Time 1135   Class Stop Time 1219   Class Time Calculation (min) 44 min    Row Name 12/03/22 1500     Education   Cardiac Education Topics Pritikin   Orthoptist   Educator Dietitian   Weekly Topic Powerhouse Plant-Based Proteins   Instruction Review Code 1- Verbalizes Understanding   Class Start Time 1145   Class Stop Time 1222   Class Time Calculation (min) 37 min    Row Name 12/05/22 1200     Education   Cardiac Education Topics Pritikin   Hospital doctor Education   General Education Hypertension and Heart Disease   Instruction Review Code 1- Verbalizes Understanding   Class Start Time 1153   Class Stop Time 1235   Class Time Calculation (min) 42 min    Row Name 12/08/22 1600     Education   Cardiac Education Topics Pritikin   Geographical information systems officer Psychosocial   Psychosocial Workshop From Head to Heart: The Power of a Healthy Outlook   Instruction Review Code 1- Verbalizes Understanding   Class Start Time 1150   Class Stop Time 1235   Class Time Calculation (min) 45 min    Row Name 12/10/22 1300     Education   Cardiac Education Topics Pritikin   Customer service manager   Weekly Topic Adding Flavor - Sodium-Free   Instruction Review Code 1- Verbalizes Understanding   Class Start Time 1140   Class Stop Time 1220   Class Time Calculation (min) 40 min    Row Name 12/12/22 1200      Education   Cardiac Education Topics Pritikin   Hospital doctor Education   General Education Heart Disease Risk Reduction   Instruction Review Code  1- Verbalizes Understanding   Class Start Time 1157   Class Stop Time 1234   Class Time Calculation (min) 37 min    Row Name 12/15/22 1300     Education   Cardiac Education Topics Pritikin   Select Workshops     Workshops   Educator Exercise Physiologist   Select Exercise   Exercise Workshop Location manager and Fall Prevention   Instruction Review Code 1- Verbalizes Understanding   Class Start Time 1150   Class Stop Time 1240   Class Time Calculation (min) 50 min            Core Videos: Exercise    Move It!  Clinical staff conducted group or individual video education with verbal and written material and guidebook.  Patient learns the recommended Pritikin exercise program. Exercise with the goal of living a long, healthy life. Some of the health benefits of exercise include controlled diabetes, healthier blood pressure levels, improved cholesterol levels, improved heart and lung capacity, improved sleep, and better body composition. Everyone should speak with their doctor before starting or changing an exercise routine.  Biomechanical Limitations Clinical staff conducted group or individual video education with verbal and written material and guidebook.  Patient learns how biomechanical limitations can impact exercise and how we can mitigate and possibly overcome limitations to have an impactful and balanced exercise routine.  Body Composition Clinical staff conducted group or individual video education with verbal and written material and guidebook.  Patient learns that body composition (ratio of muscle mass to fat mass) is a key component to assessing overall fitness, rather than body weight alone. Increased fat mass, especially visceral belly fat,  can put [place] at increased risk for metabolic syndrome, type 2 diabetes, heart disease, and even death. It is recommended to combine diet and exercise (cardiovascular and resistance training) to improve your body composition. Seek guidance from your physician and exercise physiologist before implementing an exercise routine.  Exercise Action Plan Clinical staff conducted group or individual video education with verbal and written material and guidebook.  Patient learns the recommended strategies to achieve and enjoy long-term exercise adherence, including variety, self-motivation, self-efficacy, and positive decision making. Benefits of exercise include fitness, good health, weight management, more energy, better sleep, less stress, and overall well-being.  Medical   Heart Disease Risk Reduction Clinical staff conducted group or individual video education with verbal and written material and guidebook.  Patient learns our heart is our most vital organ as it circulates oxygen, nutrients, white blood cells, and hormones throughout the entire body, and carries waste away. Data supports a plant-based eating plan like the Pritikin Program for its effectiveness in slowing progression of and reversing heart disease. The video provides a number of recommendations to address heart disease.   Metabolic Syndrome and Belly Fat  Clinical staff conducted group or individual video education with verbal and written material and guidebook.  Patient learns what metabolic syndrome is, how it leads to heart disease, and how one can reverse it and keep it from coming back. You have metabolic syndrome if you have 3 of the following 5 criteria: abdominal obesity, high blood pressure, high triglycerides, low HDL cholesterol, and high blood sugar.  Hypertension and Heart Disease Clinical staff conducted group or individual video education with verbal and written material and guidebook.  Patient learns that high blood pressure,  or hypertension, is very common in the [place]. Hypertension is largely due to excessive salt intake, but other important risk factors include  being overweight, physical inactivity, drinking too much alcohol, smoking, and not eating enough potassium from fruits and vegetables. High blood pressure is a leading risk factor for heart attack, stroke, congestive heart failure, dementia, kidney failure, and premature death. Long-term effects of excessive salt intake include stiffening of the arteries and thickening of heart muscle and organ damage. Recommendations include ways to reduce hypertension and the risk of heart disease.  Diseases of Our Time - Focusing on Diabetes Clinical staff conducted group or individual video education with verbal and written material and guidebook.  Patient learns why the best way to stop diseases of our time is prevention, through food and other lifestyle changes. Medicine (such as prescription pills and surgeries) is often only a Band-Aid on the problem, not a long-term solution. Most common diseases of our time include obesity, type 2 diabetes, hypertension, heart disease, and cancer. The Pritikin Program is recommended and has been proven to help reduce, reverse, and/or prevent the damaging effects of metabolic syndrome.  Nutrition   Overview of the Pritikin Eating Plan  Clinical staff conducted group or individual video education with verbal and written material and guidebook.  Patient learns about the Pritikin Eating Plan for disease risk reduction. The Pritikin Eating Plan emphasizes a wide variety of unrefined, minimally-processed carbohydrates, like fruits, vegetables, whole grains, and legumes. Go, Caution, and Stop food choices are explained. Plant-based and lean animal proteins are emphasized. Rationale provided for low sodium intake for blood pressure control, low added sugars for blood sugar stabilization, and low added fats and oils for coronary artery disease  risk reduction and weight management.  Calorie Density  Clinical staff conducted group or individual video education with verbal and written material and guidebook.  Patient learns about calorie density and how it impacts the Pritikin Eating Plan. Knowing the characteristics of the food you choose will help you decide whether those foods will lead to weight gain or weight loss, and whether you want to consume more or less of them. Weight loss is usually a side effect of the Pritikin Eating Plan because of its focus on low calorie-dense foods.  Label Reading  Clinical staff conducted group or individual video education with verbal and written material and guidebook.  Patient learns about the Pritikin recommended label reading guidelines and corresponding recommendations regarding calorie density, added sugars, sodium content, and whole grains.  Dining Out - Part 1  Clinical staff conducted group or individual video education with verbal and written material and guidebook.  Patient learns that restaurant meals can be sabotaging because they can be so high in calories, fat, sodium, and/or sugar. Patient learns recommended strategies on how to positively address this and avoid unhealthy pitfalls.  Facts on Fats  Clinical staff conducted group or individual video education with verbal and written material and guidebook.  Patient learns that lifestyle modifications can be just as effective, if not more so, as many medications for lowering your risk of heart disease. A Pritikin lifestyle can help to reduce your risk of inflammation and atherosclerosis (cholesterol build-up, or plaque, in the artery walls). Lifestyle interventions such as dietary choices and physical activity address the cause of atherosclerosis. A review of the types of fats and their impact on blood cholesterol levels, along with dietary recommendations to reduce fat intake is also included.  Nutrition Action Plan  Clinical staff  conducted group or individual video education with verbal and written material and guidebook.  Patient learns how to incorporate Pritikin recommendations into their lifestyle. Recommendations  include planning and keeping personal health goals in mind as an important part of their success.  Healthy Mind-Set    Healthy Minds, Bodies, Hearts  Clinical staff conducted group or individual video education with verbal and written material and guidebook.  Patient learns how to identify when they are stressed. Video will discuss the impact of that stress, as well as the many benefits of stress management. Patient will also be introduced to stress management techniques. The way we think, act, and feel has an impact on our hearts.  How Our Thoughts Can Heal Our Hearts  Clinical staff conducted group or individual video education with verbal and written material and guidebook.  Patient learns that negative thoughts can cause depression and anxiety. This can result in negative lifestyle behavior and serious health problems. Cognitive behavioral therapy is an effective method to help control our thoughts in order to change and improve our emotional outlook.  Additional Videos:  Exercise    Improving Performance  Clinical staff conducted group or individual video education with verbal and written material and guidebook.  Patient learns to use a non-linear approach by alternating intensity levels and lengths of time spent exercising to help burn more calories and lose more body fat. Cardiovascular exercise helps improve heart health, metabolism, hormonal balance, blood sugar control, and recovery from fatigue. Resistance training improves strength, endurance, balance, coordination, reaction time, metabolism, and muscle mass. Flexibility exercise improves circulation, posture, and balance. Seek guidance from your physician and exercise physiologist before implementing an exercise routine and learn your capabilities  and proper form for all exercise.  Introduction to Yoga  Clinical staff conducted group or individual video education with verbal and written material and guidebook.  Patient learns about yoga, a discipline of the coming together of mind, breath, and body. The benefits of yoga include improved flexibility, improved range of motion, better posture and core strength, increased lung function, weight loss, and positive self-image. Yoga's heart health benefits include lowered blood pressure, healthier heart rate, decreased cholesterol and triglyceride levels, improved immune function, and reduced stress. Seek guidance from your physician and exercise physiologist before implementing an exercise routine and learn your capabilities and proper form for all exercise.  Medical   Aging: Enhancing Your Quality of Life  Clinical staff conducted group or individual video education with verbal and written material and guidebook.  Patient learns key strategies and recommendations to stay in good physical health and enhance quality of life, such as prevention strategies, having an advocate, securing a Health Care Proxy and Power of Attorney, and keeping a list of medications and system for tracking them. It also discusses how to avoid risk for bone loss.  Biology of Weight Control  Clinical staff conducted group or individual video education with verbal and written material and guidebook.  Patient learns that weight gain occurs because we consume more calories than we burn (eating more, moving less). Even if your body weight is normal, you may have higher ratios of fat compared to muscle mass. Too much body fat puts you at increased risk for cardiovascular disease, heart attack, stroke, type 2 diabetes, and obesity-related cancers. In addition to exercise, following the Pritikin Eating Plan can help reduce your risk.  Decoding Lab Results  Clinical staff conducted group or individual video education with verbal and  written material and guidebook.  Patient learns that lab test reflects one measurement whose values change over time and are influenced by many factors, including medication, stress, sleep, exercise, food, hydration, pre-existing  medical conditions, and more. It is recommended to use the knowledge from this video to become more involved with your lab results and evaluate your numbers to speak with your doctor.   Diseases of Our Time - Overview  Clinical staff conducted group or individual video education with verbal and written material and guidebook.  Patient learns that according to the CDC, 50% to 70% of chronic diseases (such as obesity, type 2 diabetes, elevated lipids, hypertension, and heart disease) are avoidable through lifestyle improvements including healthier food choices, listening to satiety cues, and increased physical activity.  Sleep Disorders Clinical staff conducted group or individual video education with verbal and written material and guidebook.  Patient learns how good quality and duration of sleep are important to overall health and well-being. Patient also learns about sleep disorders and how they impact health along with recommendations to address them, including discussing with a physician.  Nutrition  Dining Out - Part 2 Clinical staff conducted group or individual video education with verbal and written material and guidebook.  Patient learns how to plan ahead and communicate in order to maximize their dining experience in a healthy and nutritious manner. Included are recommended food choices based on the type of restaurant the patient is visiting.   Fueling a Banker conducted group or individual video education with verbal and written material and guidebook.  There is a strong connection between our food choices and our health. Diseases like obesity and type 2 diabetes are very prevalent and are in large-part due to lifestyle choices. The  Pritikin Eating Plan provides plenty of food and hunger-curbing satisfaction. It is easy to follow, affordable, and helps reduce health risks.  Menu Workshop  Clinical staff conducted group or individual video education with verbal and written material and guidebook.  Patient learns that restaurant meals can sabotage health goals because they are often packed with calories, fat, sodium, and sugar. Recommendations include strategies to plan ahead and to communicate with the manager, chef, or server to help order a healthier meal.  Planning Your Eating Strategy  Clinical staff conducted group or individual video education with verbal and written material and guidebook.  Patient learns about the Pritikin Eating Plan and its benefit of reducing the risk of disease. The Pritikin Eating Plan does not focus on calories. Instead, it emphasizes high-quality, nutrient-rich foods. By knowing the characteristics of the foods, we choose, we can determine their calorie density and make informed decisions.  Targeting Your Nutrition Priorities  Clinical staff conducted group or individual video education with verbal and written material and guidebook.  Patient learns that lifestyle habits have a tremendous impact on disease risk and progression. This video provides eating and physical activity recommendations based on your personal health goals, such as reducing LDL cholesterol, losing weight, preventing or controlling type 2 diabetes, and reducing high blood pressure.  Vitamins and Minerals  Clinical staff conducted group or individual video education with verbal and written material and guidebook.  Patient learns different ways to obtain key vitamins and minerals, including through a recommended healthy diet. It is important to discuss all supplements you take with your doctor.   Healthy Mind-Set    Smoking Cessation  Clinical staff conducted group or individual video education with verbal and written  material and guidebook.  Patient learns that cigarette smoking and tobacco addiction pose a serious health risk which affects millions of people. Stopping smoking will significantly reduce the risk of heart disease, lung disease, and many  forms of cancer. Recommended strategies for quitting are covered, including working with your doctor to develop a successful plan.  Culinary   Becoming a Set designer conducted group or individual video education with verbal and written material and guidebook.  Patient learns that cooking at home can be healthy, cost-effective, quick, and puts them in control. Keys to cooking healthy recipes will include looking at your recipe, assessing your equipment needs, planning ahead, making it simple, choosing cost-effective seasonal ingredients, and limiting the use of added fats, salts, and sugars.  Cooking - Breakfast and Snacks  Clinical staff conducted group or individual video education with verbal and written material and guidebook.  Patient learns how important breakfast is to satiety and nutrition through the entire day. Recommendations include key foods to eat during breakfast to help stabilize blood sugar levels and to prevent overeating at meals later in the day. Planning ahead is also a key component.  Cooking - Educational psychologist conducted group or individual video education with verbal and written material and guidebook.  Patient learns eating strategies to improve overall health, including an approach to cook more at home. Recommendations include thinking of animal protein as a side on your plate rather than center stage and focusing instead on lower calorie dense options like vegetables, fruits, whole grains, and plant-based proteins, such as beans. Making sauces in large quantities to freeze for later and leaving the skin on your vegetables are also recommended to maximize your experience.  Cooking - Healthy Salads and  Dressing Clinical staff conducted group or individual video education with verbal and written material and guidebook.  Patient learns that vegetables, fruits, whole grains, and legumes are the foundations of the Pritikin Eating Plan. Recommendations include how to incorporate each of these in flavorful and healthy salads, and how to create homemade salad dressings. Proper handling of ingredients is also covered. Cooking - Soups and [employer] - Soups and Desserts Clinical staff conducted group or individual video education with verbal and written material and guidebook.  Patient learns that Pritikin soups and desserts make for easy, nutritious, and delicious snacks and meal components that are low in sodium, fat, sugar, and calorie density, while high in vitamins, minerals, and filling fiber. Recommendations include simple and healthy ideas for soups and desserts.   Overview     The Pritikin Solution Program Overview Clinical staff conducted group or individual video education with verbal and written material and guidebook.  Patient learns that the results of the Pritikin Program have been documented in more than 100 articles published in peer-reviewed journals, and the benefits include reducing risk factors for (and, in some cases, even reversing) high cholesterol, high blood pressure, type 2 diabetes, obesity, and more! An overview of the three key pillars of the Pritikin Program will be covered: eating well, doing regular exercise, and having a healthy mind-set.  WORKSHOPS  Exercise: Exercise Basics: Building Your Action Plan Clinical staff led group instruction and group discussion with PowerPoint presentation and patient guidebook. To enhance the learning environment the use of posters, models and videos may be added. At the conclusion of this workshop, patients will comprehend the difference between physical activity and exercise, as well as the benefits of incorporating both, into  their routine. Patients will understand the FITT (Frequency, Intensity, Time, and Type) principle and how to use it to build an exercise action plan. In addition, safety concerns and other considerations for exercise and cardiac rehab will be  addressed by the presenter. The purpose of this lesson is to promote a comprehensive and effective weekly exercise routine in order to improve patients' overall level of fitness.   Managing Heart Disease: Your Path to a Healthier Heart Clinical staff led group instruction and group discussion with PowerPoint presentation and patient guidebook. To enhance the learning environment the use of posters, models and videos may be added.At the conclusion of this workshop, patients will understand the anatomy and physiology of the heart. Additionally, they will understand how Pritikin's three pillars impact the risk factors, the progression, and the management of heart disease.  The purpose of this lesson is to provide a high-level overview of the heart, heart disease, and how the Pritikin lifestyle positively impacts risk factors.  Exercise Biomechanics Clinical staff led group instruction and group discussion with PowerPoint presentation and patient guidebook. To enhance the learning environment the use of posters, models and videos may be added. Patients will learn how the structural parts of their bodies function and how these functions impact their daily activities, movement, and exercise. Patients will learn how to promote a neutral spine, learn how to manage pain, and identify ways to improve their physical movement in order to promote healthy living. The purpose of this lesson is to expose patients to common physical limitations that impact physical activity. Participants will learn practical ways to adapt and manage aches and pains, and to minimize their effect on regular exercise. Patients will learn how to maintain good posture while sitting, walking, and  lifting.  Balance Training and Fall Prevention  Clinical staff led group instruction and group discussion with PowerPoint presentation and patient guidebook. To enhance the learning environment the use of posters, models and videos may be added. At the conclusion of this workshop, patients will understand the importance of their sensorimotor skills (vision, proprioception, and the vestibular system) in maintaining their ability to balance as they age. Patients will apply a variety of balancing exercises that are appropriate for their current level of function. Patients will understand the common causes for poor balance, possible solutions to these problems, and ways to modify their physical environment in order to minimize their fall risk. The purpose of this lesson is to teach patients about the importance of maintaining balance as they age and ways to minimize their risk of falling.  WORKSHOPS   Nutrition:  Fueling a Ship broker led group instruction and group discussion with PowerPoint presentation and patient guidebook. To enhance the learning environment the use of posters, models and videos may be added. Patients will review the foundational principles of the Pritikin Eating Plan and understand what constitutes a serving size in each of the food groups. Patients will also learn Pritikin-friendly foods that are better choices when away from home and review make-ahead meal and snack options. Calorie density will be reviewed and applied to three nutrition priorities: weight maintenance, weight loss, and weight gain. The purpose of this lesson is to reinforce (in a group setting) the key concepts around what patients are recommended to eat and how to apply these guidelines when away from home by planning and selecting Pritikin-friendly options. Patients will understand how calorie density may be adjusted for different weight management goals.  Mindful Eating  Clinical staff led  group instruction and group discussion with PowerPoint presentation and patient guidebook. To enhance the learning environment the use of posters, models and videos may be added. Patients will briefly review the concepts of the Pritikin Eating Plan and the  importance of low-calorie dense foods. The concept of mindful eating will be introduced as well as the importance of paying attention to internal hunger signals. Triggers for non-hunger eating and techniques for dealing with triggers will be explored. The purpose of this lesson is to provide patients with the opportunity to review the basic principles of the Pritikin Eating Plan, discuss the value of eating mindfully and how to measure internal cues of hunger and fullness using the Hunger Scale. Patients will also discuss reasons for non-hunger eating and learn strategies to use for controlling emotional eating.  Targeting Your Nutrition Priorities Clinical staff led group instruction and group discussion with PowerPoint presentation and patient guidebook. To enhance the learning environment the use of posters, models and videos may be added. Patients will learn how to determine their genetic susceptibility to disease by reviewing their family history. Patients will gain insight into the importance of diet as part of an overall healthy lifestyle in mitigating the impact of genetics and other environmental insults. The purpose of this lesson is to provide patients with the opportunity to assess their personal nutrition priorities by looking at their family history, their own health history and current risk factors. Patients will also be able to discuss ways of prioritizing and modifying the Pritikin Eating Plan for their highest risk areas  Menu  Clinical staff led group instruction and group discussion with PowerPoint presentation and patient guidebook. To enhance the learning environment the use of posters, models and videos may be added. Using menus  brought in from [employer], or printed from [employer], patients will apply the Pritikin dining out guidelines that were presented in the [employer] video. Patients will also be able to practice these guidelines in a variety of provided scenarios. The purpose of this lesson is to provide patients with the opportunity to practice hands-on learning of the Pritikin Dining Out guidelines with actual menus and practice scenarios.  Label Reading Clinical staff led group instruction and group discussion with PowerPoint presentation and patient guidebook. To enhance the learning environment the use of posters, models and videos may be added. Patients will review and discuss the Pritikin label reading guidelines presented in Pritikin's Label Reading Educational series video. Using fool labels brought in from local grocery stores and markets, patients will apply the label reading guidelines and determine if the packaged food meet the Pritikin guidelines. The purpose of this lesson is to provide patients with the opportunity to review, discuss, and practice hands-on learning of the Pritikin Label Reading guidelines with actual packaged food labels. Cooking School  Pritikin's [employer] are designed to teach patients ways to prepare quick, simple, and affordable recipes at home. The importance of nutrition's role in chronic disease risk reduction is reflected in its emphasis in the overall Pritikin program. By learning how to prepare essential core Pritikin Eating Plan recipes, patients will increase control over what they eat; be able to customize the flavor of foods without the use of added salt, sugar, or fat; and improve the quality of the food they consume. By learning a set of core recipes which are easily assembled, quickly prepared, and affordable, patients are more likely to prepare more healthy foods at home. These workshops focus on convenient breakfasts, simple  entres, side dishes, and desserts which can be prepared with minimal effort and are consistent with nutrition recommendations for cardiovascular risk reduction. Cooking [employer] are taught by a Armed forces logistics/support/administrative officer (RD) who has been trained by  the [employer]ritikin [employer] team. The chef or RD has a clear understanding of the importance of minimizing - if not completely eliminating - added fat, sugar, and sodium in recipes. Throughout the series of Cooking School Workshop sessions, patients will learn about healthy ingredients and efficient methods of cooking to build confidence in their capability to prepare    Cooking School weekly topics:  Adding Flavor- Sodium-Free  Fast and Healthy Breakfasts  Powerhouse Plant-Based Proteins  Satisfying Salads and Dressings  Simple Sides and Sauces  International Cuisine-Spotlight on the [employer] Zones  Delicious Desserts  Savory Soups  [employer] - Meals in a Astronomer Appetizers and Snacks  Comforting Weekend Breakfasts  One-Pot Wonders   Fast Evening Meals  Landscape architect Your Pritikin Plate  WORKSHOPS   Healthy Mindset (Psychosocial):  Focused Goals, Sustainable Changes Clinical staff led group instruction and group discussion with PowerPoint presentation and patient guidebook. To enhance the learning environment the use of posters, models and videos may be added. Patients will be able to apply effective goal setting strategies to establish at least one personal goal, and then take consistent, meaningful action toward that goal. They will learn to identify common barriers to achieving personal goals and develop strategies to overcome them. Patients will also gain an understanding of how our mind-set can impact our ability to achieve goals and the importance of cultivating a positive and growth-oriented mind-set. The purpose of this lesson is to provide patients with a deeper understanding of how to set and  achieve personal goals, as well as the tools and strategies needed to overcome common obstacles which may arise along the way.  From Head to Heart: The Power of a Healthy Outlook  Clinical staff led group instruction and group discussion with PowerPoint presentation and patient guidebook. To enhance the learning environment the use of posters, models and videos may be added. Patients will be able to recognize and describe the impact of emotions and mood on physical health. They will discover the importance of self-care and explore self-care practices which may work for them. Patients will also learn how to utilize the 4 C's to cultivate a healthier outlook and better manage stress and challenges. The purpose of this lesson is to demonstrate to patients how a healthy outlook is an essential part of maintaining good health, especially as they continue their cardiac rehab journey.  Healthy Sleep for a Healthy Heart Clinical staff led group instruction and group discussion with PowerPoint presentation and patient guidebook. To enhance the learning environment the use of posters, models and videos may be added. At the conclusion of this workshop, patients will be able to demonstrate knowledge of the importance of sleep to overall health, well-being, and quality of life. They will understand the symptoms of, and treatments for, common sleep disorders. Patients will also be able to identify daytime and nighttime behaviors which impact sleep, and they will be able to apply these tools to help manage sleep-related challenges. The purpose of this lesson is to provide patients with a general overview of sleep and outline the importance of quality sleep. Patients will learn about a few of the most common sleep disorders. Patients will also be introduced to the concept of "sleep hygiene," and discover ways to self-manage certain sleeping problems through simple daily behavior changes. Finally, the workshop will motivate  patients by clarifying the links between quality sleep and their goals of heart-healthy living.   Recognizing and Reducing Stress Clinical staff led group  instruction and group discussion with PowerPoint presentation and patient guidebook. To enhance the learning environment the use of posters, models and videos may be added. At the conclusion of this workshop, patients will be able to understand the types of stress reactions, differentiate between acute and chronic stress, and recognize the impact that chronic stress has on their health. They will also be able to apply different coping mechanisms, such as reframing negative self-talk. Patients will have the opportunity to practice a variety of stress management techniques, such as deep abdominal breathing, progressive muscle relaxation, and/or guided imagery.  The purpose of this lesson is to educate patients on the role of stress in their lives and to provide healthy techniques for coping with it.  Learning Barriers/Preferences:  Learning Barriers/Preferences - 10/09/22 1539       Learning Barriers/Preferences   Learning Barriers Sight;Exercise Concerns   he wears glasses, history of falls   Learning Preferences Audio;Computer/Internet;Group Instruction;Individual Instruction;Pictoral;Skilled Demonstration;Verbal Instruction;Video;Written Material             Education Topics:  Knowledge Questionnaire Score:  Knowledge Questionnaire Score - 10/09/22 1523       Knowledge Questionnaire Score   Pre Score 24/24             Core Components/Risk Factors/Patient Goals at Admission:  Personal Goals and Risk Factors at Admission - 10/09/22 1358       Core Components/Risk Factors/Patient Goals on Admission    Weight Management Yes    Intervention Weight Management: Develop a combined nutrition and exercise program designed to reach desired caloric intake, while maintaining appropriate intake of nutrient and fiber, sodium and fats, and  appropriate energy expenditure required for the weight goal.;Weight Management: Provide education and appropriate resources to help participant work on and attain dietary goals.    Expected Outcomes Short Term: Continue to assess and modify interventions until short term weight is achieved;Long Term: Adherence to nutrition and physical activity/exercise program aimed toward attainment of established weight goal;Understanding recommendations for meals to include 15-35% energy as protein, 25-35% energy from fat, 35-60% energy from carbohydrates, less than 200mg  of dietary cholesterol, 20-35 gm of total fiber daily;Understanding of distribution of calorie intake throughout the day with the consumption of 4-5 meals/snacks    Heart Failure Yes    Intervention Provide a combined exercise and nutrition program that is supplemented with education, support and counseling about heart failure. Directed toward relieving symptoms such as shortness of breath, decreased exercise tolerance, and extremity edema.    Expected Outcomes Short term: Attendance in program 2-3 days a week with increased exercise capacity. Reported lower sodium intake. Reported increased fruit and vegetable intake. Reports medication compliance.;Short term: Daily weights obtained and reported for increase. Utilizing diuretic protocols set by physician.;Long term: Adoption of self-care skills and reduction of barriers for early signs and symptoms recognition and intervention leading to self-care maintenance.;Improve functional capacity of life    Hypertension Yes    Intervention Provide education on lifestyle modifcations including regular physical activity/exercise, weight management, moderate sodium restriction and increased consumption of fresh fruit, vegetables, and low fat dairy, alcohol moderation, and smoking cessation.;Monitor prescription use compliance.    Expected Outcomes Short Term: Continued assessment and intervention until BP is <  140/48mm HG in hypertensive participants. < 130/69mm HG in hypertensive participants with diabetes, heart failure or chronic kidney disease.;Long Term: Maintenance of blood pressure at goal levels.    Lipids Yes    Intervention Provide education and support for participant on nutrition &  aerobic/resistive exercise along with prescribed medications to achieve LDL 70mg , HDL >40mg .    Expected Outcomes Short Term: Participant states understanding of desired cholesterol values and is compliant with medications prescribed. Participant is following exercise prescription and nutrition guidelines.;Long Term: Cholesterol controlled with medications as prescribed, with individualized exercise RX and with personalized nutrition plan. Value goals: LDL < 70mg , HDL > 40 mg.    Stress Yes    Intervention Offer individual and/or small group education and counseling on adjustment to heart disease, stress management and health-related lifestyle change. Teach and support self-help strategies.;Refer participants experiencing significant psychosocial distress to appropriate mental health specialists for further evaluation and treatment. When possible, include family members and significant others in education/counseling sessions.    Expected Outcomes Short Term: Participant demonstrates changes in health-related behavior, relaxation and other stress management skills, ability to obtain effective social support, and compliance with psychotropic medications if prescribed.;Long Term: Emotional wellbeing is indicated by absence of clinically significant psychosocial distress or social isolation.             Core Components/Risk Factors/Patient Goals Review:   Goals and Risk Factor Review     Row Name 10/13/22 1525 10/16/22 1624 11/18/22 1144 12/11/22 1404       Core Components/Risk Factors/Patient Goals Review   Personal Goals Review Weight Management/Obesity;Heart Failure;Hypertension;Lipids;Stress Weight  Management/Obesity;Heart Failure;Hypertension;Lipids;Stress Weight Management/Obesity;Heart Failure;Hypertension;Lipids;Stress Weight Management/Obesity;Heart Failure;Hypertension;Lipids;Stress    Review Rocky Link started cardiac rehab on 10/13/22. Rocky Link did well with exercise. Vital signs were stable. Rocky Link started cardiac rehab on 10/13/22. Rocky Link is off to a good start to  exercise. Vital signs have been stable. Rocky Link is doing well with exercise at cardiac rehab  Vital signs have been stable. Rudell Cobb has lost 1.4 kg since starting the program Rocky Link is doing well with exercise at cardiac rehab  Vital signs have been stable. Systolic BP's have been in the 90's. No furhter complaints of dizziness since episode on 11/26/22.  Rudell Cobb has lost 4.7  kg since starting the program and has been increasing his workloads.    Expected Outcomes Rocky Link will continue to participate in cardiac rehab for exercise, nutriiton and lifestyle modifications. Rocky Link will continue to participate in cardiac rehab for exercise, nutriiton and lifestyle modifications. Rocky Link will continue to participate in cardiac rehab for exercise, nutriiton and lifestyle modifications. Rocky Link will continue to participate in cardiac rehab for exercise, nutriiton and lifestyle modifications.             Core Components/Risk Factors/Patient Goals at Discharge (Final Review):   Goals and Risk Factor Review - 12/11/22 1404       Core Components/Risk Factors/Patient Goals Review   Personal Goals Review Weight Management/Obesity;Heart Failure;Hypertension;Lipids;Stress    Review Rocky Link is doing well with exercise at cardiac rehab  Vital signs have been stable. Systolic BP's have been in the 90's. No furhter complaints of dizziness since episode on 11/26/22.  Rudell Cobb has lost 4.7  kg since starting the program and has been increasing his workloads.    Expected Outcomes Rocky Link will continue to participate in cardiac rehab for exercise, nutriiton and lifestyle modifications.              ITP Comments:  ITP Comments     Row Name 10/09/22 1357 10/13/22 1439 10/16/22 1619 11/18/22 1140 12/11/22 1349   ITP Comments Dr. Armanda Magic medical director. Introduction to pritikin education/ intensive cardiac rehab. Initial orientation packet reviewed with patient. 30 Day ITP comments. Rocky Link started cardiac rehab on 10/13/22. Rocky Link did well  with exercise. 30 Day ITP comments. Kent started cardiac rehab on 10/13/22. Rudell Cobb is off to a good start to exercise. 30 Day ITP comments. Rudell Cobb has good attendance and participaition in  cardiac rehab 30 Day ITP comments. Rudell Cobb continues to have good attendance and participaition in  cardiac rehab            Comments: See ITP Comments

## 2022-12-17 ENCOUNTER — Other Ambulatory Visit: Payer: Self-pay

## 2022-12-17 ENCOUNTER — Encounter (HOSPITAL_COMMUNITY): Payer: Medicaid Other

## 2022-12-17 ENCOUNTER — Telehealth (INDEPENDENT_AMBULATORY_CARE_PROVIDER_SITE_OTHER): Payer: Medicaid Other | Admitting: Psychiatry

## 2022-12-17 ENCOUNTER — Encounter (HOSPITAL_COMMUNITY): Payer: Self-pay | Admitting: Psychiatry

## 2022-12-17 DIAGNOSIS — F32A Depression, unspecified: Secondary | ICD-10-CM | POA: Diagnosis not present

## 2022-12-17 DIAGNOSIS — F411 Generalized anxiety disorder: Secondary | ICD-10-CM | POA: Diagnosis not present

## 2022-12-17 MED ORDER — HYDROXYZINE HCL 25 MG PO TABS
25.0000 mg | ORAL_TABLET | Freq: Three times a day (TID) | ORAL | 3 refills | Status: DC | PRN
  Filled 2022-12-17: qty 90, 30d supply, fill #0

## 2022-12-17 MED ORDER — MELATONIN 5 MG PO TABS
10.0000 mg | ORAL_TABLET | Freq: Every day | ORAL | 3 refills | Status: DC
  Filled 2022-12-17: qty 30, 15d supply, fill #0

## 2022-12-17 MED ORDER — MIRTAZAPINE 45 MG PO TABS
45.0000 mg | ORAL_TABLET | Freq: Every day | ORAL | 3 refills | Status: DC
  Filled 2022-12-17 – 2022-12-27 (×2): qty 30, 30d supply, fill #0
  Filled 2023-01-31: qty 30, 30d supply, fill #1

## 2022-12-17 NOTE — Progress Notes (Signed)
BH MD/PA/NP OP Progress Note Virtual Visit via Video Note  I connected with Craig Flores on 12/17/22 at  3:30 PM EST by a video enabled telemedicine application and verified that I am speaking with the correct person using two identifiers.  Location: Patient: home Provider: Clinic   I discussed the limitations of evaluation and management by telemedicine and the availability of in person appointments. The patient expressed understanding and agreed to proceed.  I provided 30 minutes of non-face-to-face time during this encounter.       12/17/2022 3:43 PM Craig Flores  MRN:  [id number]  Chief Complaint: "I am more energized"  HPI:  57 year old male seen today for follow up psychiatric evaluation. He has a psychiatric history of alcohol dependence, substance induced mood disorder, anxiety, depression, and SI. He is currently being managed on  hydroxyzine 25 mg three times daily as needed, and Mirtazapine 45 mg at nightly.  Patient also takes melatonin 5 mg provides it over-the-counter.  He notes that his medications are effective in managing his psychiatric conditions but notes that he has been having sexual side effects.   Today he well pleasant, cooperative, engaged in conversation.  He informed Clinical research associate that since discontinuing Zoloft he feels more energized.  Patient also notes that his anxiety and depression has improved.  He notes that he is actively going to physical therapy and is exercising/eating better.  He reports that his sexual side effects have subsided.  Patient notes that he is lost 12 pounds since his last visit. Today  provider conducted a GAD-7 and he is 6, at his last visit he scored a 10.  Provider also conducted a PHQ-9 and patient scored a 5, at her last visit he scored a 9.  He endorses adequate sleep and appetite.  Today he denies SI/HI/VAH, mania, paranoia.    No medication changes made today. Patient agreeable to continue medications as prescribed.  No other  concerns noted at this time.   Visit Diagnosis:    ICD-10-CM   1. Generalized anxiety disorder  F41.1 hydrOXYzine (ATARAX) 25 MG tablet    mirtazapine (REMERON) 45 MG tablet    2. Mild depression  F32.A mirtazapine (REMERON) 45 MG tablet         Past Psychiatric History: alcohol dependence, substance induced mood disorder, anxiety, depression, and SI.   Past Medical History:  Past Medical History:  Diagnosis Date   Acid reflux    AICD (automatic cardioverter/defibrillator) present    Anxiety    Aortic stenosis with bicuspid valve    CHF (congestive heart failure) (HCC)    Depression    Dyspnea    GERD (gastroesophageal reflux disease)    HLD (hyperlipidemia)    Ischemic cardiomyopathy    Migraines    Myocardial infarction (HCC) 08/28/2019    Past Surgical History:  Procedure Laterality Date   AORTIC VALVE REPLACEMENT N/A 02/19/2022   Procedure: AORTIC VALVE REPLACEMENT (AVR) 23mm SJM REGENT MECHANICAL AORTIC VALVE;  Surgeon: Eugenio Hoes, MD;  Location: [facility] OR;  Service: Open Heart Surgery;  Laterality: N/A;   COLONOSCOPY WITH PROPOFOL N/A 07/16/2021   Procedure: COLONOSCOPY WITH PROPOFOL;  Surgeon: Iva Boop, MD;  Location: [facility];  Service: Gastroenterology;  Laterality: N/A;   CORONARY/GRAFT ACUTE MI REVASCULARIZATION N/A 08/28/2019   Procedure: Coronary/Graft Acute MI Revascularization;  Surgeon: Runell Gess, MD;  Location: [facility] INVASIVE CV LAB;  Service: Cardiovascular;  Laterality: N/A;   ICD IMPLANT N/A 11/28/2019   Procedure: ICD IMPLANT;  Surgeon: Duke Salvia, MD;  Location: [facility] INVASIVE CV LAB;  Service: Cardiovascular;  Laterality: N/A;   LEFT HEART CATH AND CORONARY ANGIOGRAPHY N/A 08/28/2019   Procedure: LEFT HEART CATH AND CORONARY ANGIOGRAPHY;  Surgeon: Runell Gess, MD;  Location: [facility] INVASIVE CV LAB;  Service: Cardiovascular;  Laterality: N/A;   POLYPECTOMY  07/16/2021   Procedure: POLYPECTOMY;  Surgeon: Iva Boop, MD;   Location: [facility];  Service: Gastroenterology;;   RIGHT HEART CATH AND CORONARY ANGIOGRAPHY N/A 12/12/2021   Procedure: RIGHT HEART CATH AND CORONARY ANGIOGRAPHY;  Surgeon: Kathleene Hazel, MD;  Location: [facility] INVASIVE CV LAB;  Service: Cardiovascular;  Laterality: N/A;   TEE WITHOUT CARDIOVERSION N/A 02/19/2022   Procedure: TRANSESOPHAGEAL ECHOCARDIOGRAM (TEE);  Surgeon: Eugenio Hoes, MD;  Location: [facility] OR;  Service: Open Heart Surgery;  Laterality: N/A;   WISDOM TOOTH EXTRACTION      Family Psychiatric History: Notes that his mother and aunt had mental health issues. Notes he belives they had anxiety  Family History:  Family History  Problem Relation Age of Onset   Cancer Mother        peritoneal   Arrhythmia Father    Prostate cancer Father    Colon cancer Brother 80   Cancer Maternal Grandmother        type unknown   Heart attack Maternal Grandfather    Diabetes Paternal Grandmother    Heart attack Paternal Grandfather     Social History:  Social History   Socioeconomic History   Marital status: Married    Spouse name: Not on file   Number of children: 0   Years of education: Not on file   Highest education level: Not on file  Occupational History   Occupation: Clinical research associate   Occupation: Attorney/Writer  Tobacco Use   Smoking status: Former    Current packs/day: 0.00    Types: Cigarettes    Quit date: 02/15/2022    Years since quitting: 0.8   Smokeless tobacco: Never   Tobacco comments:    x15 yrs as of 2021  Vaping Use   Vaping status: Never Used  Substance and Sexual Activity   Alcohol use: Not Currently    Comment: a couple of drinks on the weekends   Drug use: Never   Sexual activity: Yes    Birth control/protection: None  Other Topics Concern   Not on file  Social History Narrative   2 adopted children   Social Determinants of Health   Financial Resource Strain: Not on file  Food Insecurity: No Food Insecurity (02/20/2022)   Hunger Vital Sign     Worried About Running Out of Food in the Last Year: Never true    Ran Out of Food in the Last Year: Never true  Transportation Needs: No Transportation Needs (02/20/2022)   PRAPARE - Administrator, Civil Service (Medical): No    Lack of Transportation (Non-Medical): No  Physical Activity: Not on file  Stress: Not on file  Social Connections: Not on file    Allergies: No Known Allergies  Metabolic Disorder Labs: Lab Results  Component Value Date   HGBA1C 5.8 (H) 02/17/2022   MPG 119.76 02/17/2022   MPG 119.76 08/28/2019   No results found for: "PROLACTIN" Lab Results  Component Value Date   CHOL 102 11/18/2022   TRIG 103 11/18/2022   HDL 37 (L) 11/18/2022   CHOLHDL 2.8 11/18/2022   VLDL 21 11/18/2022   LDLCALC 44 11/18/2022   LDLCALC  46 09/16/2022   Lab Results  Component Value Date   TSH 3.181 08/28/2019    Therapeutic Level Labs: No results found for: "LITHIUM" No results found for: "VALPROATE" No results found for: "CBMZ"  Current Medications: Current Outpatient Medications  Medication Sig Dispense Refill   acetaminophen (TYLENOL) 500 MG tablet Take 500 mg by mouth every 6 (six) hours as needed for headache.     aspirin 81 MG chewable tablet Chew 1 tablet (81 mg total) by mouth daily. 90 tablet 3   atorvastatin (LIPITOR) 80 MG tablet Take 1 tablet (80 mg total) by mouth daily. 30 tablet 11   carvedilol (COREG) 6.25 MG tablet Take 1 tablet (6.25 mg total) by mouth 2 (two) times daily. 60 tablet 6   dapagliflozin propanediol (FARXIGA) 10 MG TABS tablet Take 1 tablet (10 mg total) by mouth daily before breakfast. 30 tablet 11   digoxin (LANOXIN) 0.125 MG tablet Take 1/2 tablet (0.0625 mg total) by mouth daily. 30 tablet 6   ezetimibe (ZETIA) 10 MG tablet Take 1 tablet (10 mg total) by mouth daily. 90 tablet 3   fenofibrate 160 MG tablet Take 1 tablet (160 mg total) by mouth daily. 30 tablet 3   furosemide (LASIX) 20 MG tablet Take 1 tablet (20 mg  total) by mouth daily. 30 tablet 6   hydrOXYzine (ATARAX) 25 MG tablet Take 1 tablet (25 mg total) by mouth 3 (three) times daily as needed. 90 tablet 3   melatonin 5 MG TABS Take 2 tablets (10 mg total) by mouth at bedtime. 30 tablet 3   mirtazapine (REMERON) 45 MG tablet Take 1 tablet (45 mg total) by mouth at bedtime. 30 tablet 3   Multiple Vitamin (MULTIVITAMIN WITH MINERALS) TABS tablet Take 1 tablet by mouth daily.     omega-3 acid ethyl esters (LOVAZA) 1 g capsule TAKE 2 CAPSULES (2 GRAMS TOTAL) BY MOUTH TWO TIMES DAILY. 120 capsule 3   omeprazole (PRILOSEC) 20 MG capsule Take 20 mg by mouth daily before breakfast.     Probiotic Product (PROBIOTIC DAILY PO) Take 1 capsule by mouth with breakfast, with lunch, and with evening meal.     sacubitril-valsartan (ENTRESTO) 97-103 MG Take 1 tablet by mouth 2 (two) times daily. 60 tablet 11   SILDENAFIL CITRATE PO Take 45-90 mg by mouth daily as needed (ED). Take 1 - 2 tablets 30 to 45 min before sexual activity as needed     spironolactone (ALDACTONE) 50 MG tablet Take 1 tablet (50 mg total) by mouth daily. 30 tablet 6   warfarin (COUMADIN) 5 MG tablet Take 1.5 tablets to 2 tablets by mouth once daily as directed by Anticoagulation clinic 60 tablet 1   No current facility-administered medications for this visit.     Musculoskeletal: Strength & Muscle Tone: Normal, telehealth visit Gait & Station: normal, telehealth visit Patient leans: N/A  Psychiatric Specialty Exam: Review of Systems  There were no vitals taken for this visit.There is no height or weight on file to calculate BMI.  General Appearance: Well Groomed  Eye Contact:  Good  Speech:  Clear and Coherent and Normal Rate  Volume:  Normal  Mood:  Euthymic  Affect:  Appropriate and Congruent  Thought Process:  Coherent, Goal Directed and Linear  Orientation:  Full (Time, Place, and Person)  Thought Content: WDL and Logical   Suicidal Thoughts:  No  Homicidal Thoughts:  No   Memory:  Immediate;   Good Recent;   Good Remote;  Good  Judgement:  Good  Insight:  Good  Psychomotor Activity:  Normal  Concentration:  Concentration: Good and Attention Span: Good  Recall:  Good  Fund of Knowledge: Good  Language: Good  Akathisia:  No  Handed:  Right  AIMS (if indicated): Not done  Assets:  Communication Skills Desire for Improvement Financial Resources/Insurance Housing Intimacy Social Support  ADL's:  Intact  Cognition: WNL  Sleep:  Good   Screenings: AIMS    Flowsheet Row [facility]  AIMS Total Score 0      AUDIT    Flowsheet Row [facility]  Alcohol Use Disorder Identification Test Final Score (AUDIT) 19      GAD-7    Flowsheet Row Video Visit from 12/17/2022 in [facility] Video Visit from 11/14/2022 in [facility] Video Visit from 08/15/2022 in [facility] Video Visit from 06/06/2022 in [facility] Video Visit from 03/26/2022 in [facility]  Total GAD-7 Score 6 10 9 15 9       PHQ2-9    Flowsheet Row Video Visit from 12/17/2022 in [facility] Video Visit from 11/14/2022 in [facility] CARDIAC REHAB PHASE II ORIENTATION from 10/09/2022 in [facility] Video Visit from 08/15/2022 in [facility] Video Visit from 06/06/2022 in [facility]  PHQ-2 Total Score 1 3 2 2 4   PHQ-9 Total Score 5 9 7 7 19       Flowsheet Row Video Visit from 06/06/2022 in [facility] Admission (Discharged) from 02/19/2022 in [facility] 4E CV SURGICAL PROGRESSIVE CARE Pre-Admission Testing 60  from 02/17/2022 in [facility] PREADMISSION TESTING  C-SSRS RISK CATEGORY Low Risk No Risk No Risk        Assessment and Plan: Patient notes that his medication are effective.  He no longer has sexual side effects since discontinuing Zoloft.  Patient also notes he is more energized.No medication changes made today. Patient agreeable to continue medications as prescribed.   1. Generalized anxiety disorder  Continue- mirtazapine (REMERON) 45 MG tablet; Take 1 tablet (45 mg total) by mouth at bedtime.  Dispense: 30 tablet; Refill: 3 Continue- hydrOXYzine (ATARAX) 25 MG tablet; Take 1 tablet (25 mg total) by mouth 3 (three) times daily as needed.  Dispense: 90 tablet; Refill: 3  2. Mild depression  Continue- mirtazapine (REMERON) 45 MG tablet; Take 1 tablet (45 mg total) by mouth at bedtime.  Dispense: 30 tablet; Refill: 3  Follow up in 1.5 months Follow up with therapy  Shanna Cisco, NP 12/17/2022, 3:43 PM

## 2022-12-19 ENCOUNTER — Ambulatory Visit (INDEPENDENT_AMBULATORY_CARE_PROVIDER_SITE_OTHER): Payer: Self-pay

## 2022-12-19 ENCOUNTER — Other Ambulatory Visit: Payer: Self-pay

## 2022-12-19 ENCOUNTER — Encounter (HOSPITAL_COMMUNITY)
Admission: RE | Admit: 2022-12-19 | Discharge: 2022-12-19 | Disposition: A | Discharge status: Home / Self Care | Payer: Medicaid Other | Source: Ambulatory Visit | Attending: Cardiology | Admitting: Cardiology

## 2022-12-19 DIAGNOSIS — I255 Ischemic cardiomyopathy: Secondary | ICD-10-CM | POA: Diagnosis not present

## 2022-12-19 DIAGNOSIS — I429 Cardiomyopathy, unspecified: Secondary | ICD-10-CM

## 2022-12-19 DIAGNOSIS — Z5189 Encounter for other specified aftercare: Secondary | ICD-10-CM | POA: Diagnosis not present

## 2022-12-19 LAB — CUP PACEART REMOTE DEVICE CHECK
Battery Remaining Longevity: 162 mo
Battery Remaining Percentage: 100 %
Brady Statistic RV Percent Paced: 0 %
Date Time Interrogation Session: 20241122010200
HighPow Impedance: 68 Ohm
Implantable Lead Connection Status: 753985
Implantable Lead Implant Date: 20211101
Implantable Lead Location: 753860
Implantable Lead Model: 138
Implantable Lead Serial Number: 303500
Implantable Pulse Generator Implant Date: 20211101
Lead Channel Impedance Value: 474 Ohm
Lead Channel Setting Pacing Amplitude: 2.5 V
Lead Channel Setting Pacing Pulse Width: 0.4 ms
Lead Channel Setting Sensing Sensitivity: 0.5 mV
Pulse Gen Serial Number: 209902

## 2022-12-20 ENCOUNTER — Other Ambulatory Visit: Payer: Self-pay | Admitting: Cardiology

## 2022-12-22 ENCOUNTER — Encounter (HOSPITAL_COMMUNITY)
Admission: RE | Admit: 2022-12-22 | Discharge: 2022-12-22 | Disposition: A | Discharge status: Home / Self Care | Payer: Medicaid Other | Source: Ambulatory Visit | Attending: Cardiology

## 2022-12-22 ENCOUNTER — Other Ambulatory Visit: Payer: Self-pay

## 2022-12-22 VITALS — BP 96/68 | HR 75 | Wt 196.2 lb

## 2022-12-22 DIAGNOSIS — Z5189 Encounter for other specified aftercare: Secondary | ICD-10-CM | POA: Diagnosis not present

## 2022-12-22 DIAGNOSIS — I429 Cardiomyopathy, unspecified: Secondary | ICD-10-CM

## 2022-12-23 ENCOUNTER — Ambulatory Visit: Payer: Medicaid Other | Attending: Cardiology

## 2022-12-23 ENCOUNTER — Other Ambulatory Visit: Payer: Self-pay

## 2022-12-23 ENCOUNTER — Telehealth: Payer: Self-pay

## 2022-12-23 MED ORDER — ATORVASTATIN CALCIUM 80 MG PO TABS
80.0000 mg | ORAL_TABLET | Freq: Every day | ORAL | 5 refills | Status: DC
  Filled 2022-12-23: qty 30, 30d supply, fill #0
  Filled 2023-01-25: qty 30, 30d supply, fill #1
  Filled 2023-02-22: qty 30, 30d supply, fill #2
  Filled 2023-03-21: qty 30, 30d supply, fill #3
  Filled 2023-04-25: qty 30, 30d supply, fill #4

## 2022-12-23 NOTE — Telephone Encounter (Signed)
Called pt since he missed his INR appt. There was no answer so left a message to call back to reschedule missed appt.

## 2022-12-24 ENCOUNTER — Encounter (HOSPITAL_COMMUNITY): Payer: Medicaid Other

## 2022-12-26 ENCOUNTER — Encounter (HOSPITAL_COMMUNITY): Payer: Medicaid Other

## 2022-12-29 ENCOUNTER — Other Ambulatory Visit: Payer: Self-pay

## 2022-12-29 ENCOUNTER — Encounter (HOSPITAL_COMMUNITY): Payer: Medicaid Other

## 2022-12-31 ENCOUNTER — Encounter (HOSPITAL_COMMUNITY): Payer: Medicaid Other

## 2023-01-02 ENCOUNTER — Ambulatory Visit (HOSPITAL_COMMUNITY): Payer: Medicaid Other

## 2023-01-05 ENCOUNTER — Other Ambulatory Visit: Payer: Self-pay

## 2023-01-06 ENCOUNTER — Telehealth (HOSPITAL_COMMUNITY): Payer: Self-pay | Admitting: *Deleted

## 2023-01-06 NOTE — Telephone Encounter (Signed)
Craig Flores has been absent from cardiac rehab. Left message on his voicemail.

## 2023-01-07 ENCOUNTER — Encounter (HOSPITAL_COMMUNITY): Payer: Self-pay | Admitting: *Deleted

## 2023-01-07 DIAGNOSIS — I429 Cardiomyopathy, unspecified: Secondary | ICD-10-CM

## 2023-01-07 NOTE — Progress Notes (Signed)
Cardiac Individual Treatment Plan  Patient Details  Name: Craig Flores MRN: [id number] Date of Birth: [birth year] Referring Provider:   Flowsheet Row CARDIAC REHAB PHASE II ORIENTATION from 10/09/2022 in [facility]  Referring Provider Dr. Marca Ancona, MD       Initial Encounter Date:  Flowsheet Row CARDIAC REHAB PHASE II ORIENTATION from 10/09/2022 in [facility]  Date 10/09/22       Visit Diagnosis: Cardiomyopathy, unspecified type (HCC)  Patient's Home Medications on Admission:  Current Outpatient Medications:    acetaminophen (TYLENOL) 500 MG tablet, Take 500 mg by mouth every 6 (six) hours as needed for headache., Disp: , Rfl:    aspirin 81 MG chewable tablet, Chew 1 tablet (81 mg total) by mouth daily., Disp: 90 tablet, Rfl: 3   atorvastatin (LIPITOR) 80 MG tablet, Take 1 tablet (80 mg total) by mouth daily., Disp: 30 tablet, Rfl: 5   carvedilol (COREG) 6.25 MG tablet, Take 1 tablet (6.25 mg total) by mouth 2 (two) times daily., Disp: 60 tablet, Rfl: 6   dapagliflozin propanediol (FARXIGA) 10 MG TABS tablet, Take 1 tablet (10 mg total) by mouth daily before breakfast., Disp: 30 tablet, Rfl: 11   digoxin (LANOXIN) 0.125 MG tablet, Take 1/2 tablet (0.0625 mg total) by mouth daily., Disp: 30 tablet, Rfl: 6   ezetimibe (ZETIA) 10 MG tablet, Take 1 tablet (10 mg total) by mouth daily., Disp: 90 tablet, Rfl: 3   fenofibrate 160 MG tablet, Take 1 tablet (160 mg total) by mouth daily., Disp: 30 tablet, Rfl: 3   furosemide (LASIX) 20 MG tablet, Take 1 tablet (20 mg total) by mouth daily., Disp: 30 tablet, Rfl: 6   hydrOXYzine (ATARAX) 25 MG tablet, Take 1 tablet (25 mg total) by mouth 3 (three) times daily as needed., Disp: 90 tablet, Rfl: 3   melatonin 5 MG TABS, Take 2 tablets (10 mg total) by mouth at bedtime., Disp: 30 tablet, Rfl: 3   mirtazapine (REMERON) 45 MG  tablet, Take 1 tablet (45 mg total) by mouth at bedtime., Disp: 30 tablet, Rfl: 3   Multiple Vitamin (MULTIVITAMIN WITH MINERALS) TABS tablet, Take 1 tablet by mouth daily., Disp: , Rfl:    omega-3 acid ethyl esters (LOVAZA) 1 g capsule, TAKE 2 CAPSULES (2 GRAMS TOTAL) BY MOUTH TWO TIMES DAILY., Disp: 120 capsule, Rfl: 3   omeprazole (PRILOSEC) 20 MG capsule, Take 20 mg by mouth daily before breakfast., Disp: , Rfl:    Probiotic Product (PROBIOTIC DAILY PO), Take 1 capsule by mouth with breakfast, with lunch, and with evening meal., Disp: , Rfl:    sacubitril-valsartan (ENTRESTO) 97-103 MG, Take 1 tablet by mouth 2 (two) times daily., Disp: 60 tablet, Rfl: 11   SILDENAFIL CITRATE PO, Take 45-90 mg by mouth daily as needed (ED). Take 1 - 2 tablets 30 to 45 min before sexual activity as needed, Disp: , Rfl:    spironolactone (ALDACTONE) 50 MG tablet, Take 1 tablet (50 mg total) by mouth daily., Disp: 30 tablet, Rfl: 6   warfarin (COUMADIN) 5 MG tablet, Take 1.5 tablets to 2 tablets by mouth once daily as directed by Anticoagulation clinic, Disp: 60 tablet, Rfl: 1  Past Medical History: Past Medical History:  Diagnosis Date   Acid reflux    AICD (automatic cardioverter/defibrillator) present    Anxiety    Aortic stenosis with bicuspid valve    CHF (  congestive heart failure) (HCC)    Depression    Dyspnea    GERD (gastroesophageal reflux disease)    HLD (hyperlipidemia)    Ischemic cardiomyopathy    Migraines    Myocardial infarction (HCC) 08/28/2019    Tobacco Use: Social History   Tobacco Use  Smoking Status Former   Current packs/day: 0.00   Types: Cigarettes   Quit date: 02/15/2022   Years since quitting: 0.8  Smokeless Tobacco Never  Tobacco Comments   x15 yrs as of 2021    Labs: Review Flowsheet  More data exists      Latest Ref Rng & Units 01/13/2022 02/17/2022 02/19/2022 09/16/2022 11/18/2022  Labs for ITP Cardiac and Pulmonary Rehab  Cholestrol 0 - 200 mg/dL 962  - -  952  841   LDL (calc) 0 - 99 mg/dL 36  - - 46  44   HDL-C >40 mg/dL 36  - - 45  37   Trlycerides <150 mg/dL 324  - - 401  027   Hemoglobin A1c 4.8 - 5.6 % - 5.8  - - -  PH, Arterial 7.35 - 7.45 - 7.44  7.304  7.295  7.290  7.312  7.353  7.259  7.328  7.327  - -  PCO2 arterial 32 - 48 mmHg - 39  45.9  46.7  47.5  47.8  38.4  55.3  45.6  44.1  - -  Bicarbonate 20.0 - 28.0 mmol/L - 26.5  22.8  22.8  23.1  24.2  21.4  24.8  24.8  24.0  23.1  - -  TCO2 22 - 32 mmol/L - - 24  24  25  24  26  23  26  27  26  25  26  24  24   - -  Acid-base deficit 0.0 - 2.0 mmol/L - - 4.0  4.0  4.0  2.0  4.0  3.0  2.0  2.0  3.0  - -  O2 Saturation % - 100  95  97  95  99  100  100  82  100  100  - -    Details       Multiple values from one day are sorted in reverse-chronological order         Capillary Blood Glucose: Lab Results  Component Value Date   GLUCAP 87 02/23/2022   GLUCAP 100 (H) 02/23/2022   GLUCAP 91 02/23/2022   GLUCAP 117 (H) 02/22/2022   GLUCAP 104 (H) 02/22/2022     Exercise Target Goals: Exercise Program Goal: Individual exercise prescription set using results from initial 6 min walk test and THRR while considering  patient's activity barriers and safety.   Exercise Prescription Goal: Initial exercise prescription builds to 30-45 minutes a day of aerobic activity, 2-3 days per week.  Home exercise guidelines will be given to patient during program as part of exercise prescription that the participant will acknowledge.  Activity Barriers & Risk Stratification:  Activity Barriers & Cardiac Risk Stratification - 10/09/22 1527       Activity Barriers & Cardiac Risk Stratification   Activity Barriers History of Falls    Cardiac Risk Stratification High   under 5 MET's on            6 Minute Walk:  6 Minute Walk     Row Name 10/09/22 1522         6 Minute Walk   Phase Initial     Distance 1440  feet     Walk Time 6 minutes     # of Rest Breaks 0     MPH 2.73      METS 2.98     RPE 11     Perceived Dyspnea  0     VO2 Peak 10.44     Symptoms No     Resting HR 77 bpm     Resting BP 112/74     Resting Oxygen Saturation  96 %     Exercise Oxygen Saturation  during 6 min walk 98 %     Max Ex. HR 98 bpm     Max Ex. BP 130/82     2 Minute Post BP 114/74              Oxygen Initial Assessment:   Oxygen Re-Evaluation:   Oxygen Discharge (Final Oxygen Re-Evaluation):   Initial Exercise Prescription:  Initial Exercise Prescription - 10/09/22 1500       Date of Initial Exercise RX and Referring Provider   Date 10/09/22    Referring Provider Dr. Marca Ancona, MD    Expected Discharge Date 12/31/22      Treadmill   MPH 2.4    Grade 0    Minutes 15    METs 2.84      Bike   Level 2    Watts 40    Minutes 30    METs 2.5      Prescription Details   Frequency (times per week) 3    Duration Progress to 30 minutes of continuous aerobic without signs/symptoms of physical distress      Intensity   THRR 40-80% of Max Heartrate 65-131    Ratings of Perceived Exertion 11-13    Perceived Dyspnea 0-4      Progression   Progression Continue progressive overload as per policy without signs/symptoms or physical distress.      Resistance Training   Training Prescription Yes   Will need cuff weight for left hand   Weight 3    Reps 10-15             Perform Capillary Blood Glucose checks as needed.  Exercise Prescription Changes:   Exercise Prescription Changes     Row Name 10/13/22 1036 10/20/22 1042 11/03/22 1032 11/17/22 1027 12/01/22 1034     Response to Exercise   Blood Pressure (Admit) 100/62 92/60 98/62  98/62 98/64   Blood Pressure (Exercise) 140/68 122/72 -- -- --   Blood Pressure (Exit) 102/78 90/60 110/64 112/60 104/58   Heart Rate (Admit) 77 bpm 77 bpm 80 bpm 96 bpm 107 bpm   Heart Rate (Exercise) 102 bpm 118 bpm 139 bpm 144 bpm 125 bpm   Heart Rate (Exit) 79 bpm 89 bpm 94 bpm 105 bpm 96 bpm   Rating of  Perceived Exertion (Exercise) 11 12 12 12 12    Symptoms None None None None None   Comments Off to a great start with exercise. Reviewed METs with Craig Flores. Increased WL on bike and TM. -- Reviewed METs and goals. --   Duration Continue with 30 min of aerobic exercise without signs/symptoms of physical distress. Continue with 30 min of aerobic exercise without signs/symptoms of physical distress. Continue with 30 min of aerobic exercise without signs/symptoms of physical distress. Continue with 30 min of aerobic exercise without signs/symptoms of physical distress. Continue with 30 min of aerobic exercise without signs/symptoms of physical distress.   Intensity THRR unchanged THRR unchanged THRR unchanged THRR  unchanged THRR unchanged     Progression   Progression Continue to progress workloads to maintain intensity without signs/symptoms of physical distress. Continue to progress workloads to maintain intensity without signs/symptoms of physical distress. Continue to progress workloads to maintain intensity without signs/symptoms of physical distress. Continue to progress workloads to maintain intensity without signs/symptoms of physical distress. Continue to progress workloads to maintain intensity without signs/symptoms of physical distress.   Average METs 3.5 4.1 4.9 5.1 4     Resistance Training   Training Prescription Yes  cuff weights Yes  cuff weights Yes  cuff weights No  Broken blood vessel in eye, no weights today Yes   Weight 3 3 3  lbs -- 3 lbs  cuff weights   Reps 10-15 10-15 10-15 -- 10-15   Time 10 Minutes 10 Minutes 10 Minutes -- 10 Minutes     Interval Training   Interval Training No No No No No     Treadmill   MPH 2.8 3.2 3.2 3.4 3.4   Grade 1 3 5 5  0   Minutes 16 16 16 16 15    METs 3.53 4.77 5.66 5.95 3.6     Bike   Level 4 8 8 8 2    Watts 42 -- -- -- --   Minutes 19 19 19 19 19    METs 3.4 3.5 4.2 4.3 4.5     Home Exercise Plan   Plans to continue exercise at -- -- Home  (comment)  Walking Home (comment)  Walking Home (comment)  Walking   Frequency -- -- Add 3 additional days to program exercise sessions. Add 3 additional days to program exercise sessions. Add 3 additional days to program exercise sessions.   Initial Home Exercises Provided -- -- 10/24/22 10/24/22 10/24/22    Row Name 12/15/22 1036 12/22/22 1025           Response to Exercise   Blood Pressure (Admit) 114/66 96/68      Blood Pressure (Exit) 99/66 102/64      Heart Rate (Admit) 92 bpm 75 bpm      Heart Rate (Exercise) 140 bpm 120 bpm      Heart Rate (Exit) 101 bpm 75 bpm      Rating of Perceived Exertion (Exercise) 12 12      Symptoms None None      Comments Reviewed goals with Craig Flores. Increased workload on bike. --      Duration Continue with 30 min of aerobic exercise without signs/symptoms of physical distress. Continue with 30 min of aerobic exercise without signs/symptoms of physical distress.      Intensity THRR unchanged THRR unchanged        Progression   Progression Continue to progress workloads to maintain intensity without signs/symptoms of physical distress. Continue to progress workloads to maintain intensity without signs/symptoms of physical distress.      Average METs 4 4.6        Resistance Training   Training Prescription Yes Yes      Weight 3 lbs  cuff weights 3 lbs  cuff weights      Reps 10-15 10-15      Time 10 Minutes 10 Minutes        Interval Training   Interval Training No No        Treadmill   MPH 3.4 3.4      Grade 0 0      Minutes 15 15      METs 3.6 3.6  Bike   Level 10 10      Minutes 15 15      METs 5 5.6        Home Exercise Plan   Plans to continue exercise at Home (comment)  Walking Home (comment)  Walking      Frequency Add 3 additional days to program exercise sessions. Add 3 additional days to program exercise sessions.      Initial Home Exercises Provided 10/24/22 10/24/22               Exercise Comments:   Exercise  Comments     Row Name 10/13/22 1132 10/20/22 1103 10/24/22 1040 11/17/22 1111 12/15/22 1052   Exercise Comments Craig Flores tolerated 1st session of exercise well without symptoms. Introduced to exercise and stretching routine. Reviewed METs with Craig Flores. Reviewed home exercise guidelines and goals with [facility]. Reviewed METs and goals with Craig Flores. Reviewed goals with [facility].            Exercise Goals and Review:   Exercise Goals     Row Name 10/09/22 1358             Exercise Goals   Increase Physical Activity Yes       Intervention Provide advice, education, support and counseling about physical activity/exercise needs.;Develop an individualized exercise prescription for aerobic and resistive training based on initial evaluation findings, risk stratification, comorbidities and participant's personal goals.       Expected Outcomes Short Term: Attend rehab on a regular basis to increase amount of physical activity.;Long Term: Exercising regularly at least 3-5 days a week.;Long Term: Add in home exercise to make exercise part of routine and to increase amount of physical activity.       Increase Strength and Stamina Yes       Intervention Provide advice, education, support and counseling about physical activity/exercise needs.;Develop an individualized exercise prescription for aerobic and resistive training based on initial evaluation findings, risk stratification, comorbidities and participant's personal goals.       Expected Outcomes Short Term: Increase workloads from initial exercise prescription for resistance, speed, and METs.;Short Term: Perform resistance training exercises routinely during rehab and add in resistance training at home;Long Term: Improve cardiorespiratory fitness, muscular endurance and strength as measured by increased METs and functional capacity ( )       Able to understand and use rate of perceived exertion (RPE) scale Yes       Intervention Provide education and  explanation on how to use RPE scale       Expected Outcomes Short Term: Able to use RPE daily in rehab to express subjective intensity level;Long Term:  Able to use RPE to guide intensity level when exercising independently       Knowledge and understanding of Target Heart Rate Range (THRR) Yes       Intervention Provide education and explanation of THRR including how the numbers were predicted and where they are located for reference       Expected Outcomes Short Term: Able to state/look up THRR;Short Term: Able to use daily as guideline for intensity in rehab;Long Term: Able to use THRR to govern intensity when exercising independently       Understanding of Exercise Prescription Yes       Intervention Provide education, explanation, and written materials on patient's individual exercise prescription       Expected Outcomes Short Term: Able to explain program exercise prescription;Long Term: Able to explain home exercise prescription to exercise independently  Exercise Goals Re-Evaluation :  Exercise Goals Re-Evaluation     Row Name 10/13/22 1132 10/24/22 1040 11/17/22 1111 12/15/22 1052       Exercise Goal Re-Evaluation   Exercise Goals Review Increase Physical Activity;Increase Strength and Stamina;Able to understand and use rate of perceived exertion (RPE) scale;Knowledge and understanding of Target Heart Rate Range (THRR) Increase Physical Activity;Increase Strength and Stamina;Able to understand and use rate of perceived exertion (RPE) scale;Knowledge and understanding of Target Heart Rate Range (THRR);Understanding of Exercise Prescription Increase Physical Activity;Increase Strength and Stamina;Able to understand and use rate of perceived exertion (RPE) scale;Knowledge and understanding of Target Heart Rate Range (THRR);Understanding of Exercise Prescription Increase Physical Activity;Increase Strength and Stamina;Able to understand and use rate of perceived exertion  (RPE) scale;Knowledge and understanding of Target Heart Rate Range (THRR);Understanding of Exercise Prescription    Comments Craig Flores was able to understand and use RPE scale appropriately. Reviewed target heart rate range with him. Increased workloads on treadmill and bike. Reviewed exercise prescription with [facility]. He's not currently exercising at home, but he has a gym membership at [employer] where he can go on his non-cardiac rehab days to exercise. Discussed accumulating 150 minutes of aerobic exercise per week, and he is amenable to this. His goal is to have more energy and to feel better. He also wants to lose weight with a goal weight of 185 lbs. Craig Flores states he's feeling better but still working on losing weight. His goal is 185 lbs. He is doing well with exercise at cardiac rehab but starting to exceed THRR. Will request THRR increase. Craig Flores has started working out at his gym 2 days/week. He is walking th etreadmill 30-35 minutes and using the machine weights.    Expected Outcomes Progress workloads as tolerated to help increase strength and stamina. Craig Flores will add 2-4 days aerobic exercise/week an addition to exercise at cardiac rehab to achieve 150 minutes of aerobic exercise/week. Progress workloads as tolerated. Continue to progress workloads as tolerated.             Discharge Exercise Prescription (Final Exercise Prescription Changes):  Exercise Prescription Changes - 12/22/22 1025       Response to Exercise   Blood Pressure (Admit) 96/68    Blood Pressure (Exit) 102/64    Heart Rate (Admit) 75 bpm    Heart Rate (Exercise) 120 bpm    Heart Rate (Exit) 75 bpm    Rating of Perceived Exertion (Exercise) 12    Symptoms None    Duration Continue with 30 min of aerobic exercise without signs/symptoms of physical distress.    Intensity THRR unchanged      Progression   Progression Continue to progress workloads to maintain intensity without signs/symptoms of physical distress.     Average METs 4.6      Resistance Training   Training Prescription Yes    Weight 3 lbs   cuff weights   Reps 10-15    Time 10 Minutes      Interval Training   Interval Training No      Treadmill   MPH 3.4    Grade 0    Minutes 15    METs 3.6      Bike   Level 10    Minutes 15    METs 5.6      Home Exercise Plan   Plans to continue exercise at Home (comment)   Walking   Frequency Add 3 additional days to program exercise sessions.  Initial Home Exercises Provided 10/24/22             Nutrition:  Target Goals: Understanding of nutrition guidelines, daily intake of sodium 1500mg , cholesterol 200mg , calories 30% from fat and 7% or less from saturated fats, daily to have 5 or more servings of fruits and vegetables.  Biometrics:  Pre Biometrics - 10/09/22 1523       Pre Biometrics   Waist Circumference 39 inches    Hip Circumference 40.5 inches    Waist to Hip Ratio 0.96 %    Triceps Skinfold 25 mm    % Body Fat 29.6 %    Grip Strength 35 kg    Flexibility 15.75 in    Single Leg Stand 30 seconds              Nutrition Therapy Plan and Nutrition Goals:  Nutrition Therapy & Goals - 12/11/22 1310       Nutrition Therapy   Diet Heart Healthy Diet    Drug/Food Interactions Statins/Certain Fruits      Personal Nutrition Goals   Nutrition Goal Patient to identify strategies for reducing cardiovascular risk by attending the [employer] education and nutrition series weekly.   goal in action.   Personal Goal #2 Patient to improve diet quality by using the plate method as a guide for meal planning to include lean protein/plant protein, fruits, vegetables, whole grains, nonfat dairy as part of a well-balanced diet.   goal in action.   Personal Goal #3 Patient to reduce sodium to 1500mg  per day   goal in action.   Comments Goals in progress. Craig Flores continues to attend the [employer] and nutrition series regularly. He has started making many dietary changes  including increased low calorie dense foods and increased high fiber foods. Per documentation, he has stopped alcohol ~4 months ago; he continues regular follow-up with behavioal health. His triglycerides have improved WNL.  He continues regular follow-up with anti-coagulation clinic. He is down 10.3## since starting with our program; his goal weight is ~185#.  Patient will benefit from participation in intensive cardiac rehab for nutrition, exercise, and lifestyle modification.      Intervention Plan   Intervention Prescribe, educate and counsel regarding individualized specific dietary modifications aiming towards targeted core components such as weight, hypertension, lipid management, diabetes, heart failure and other comorbidities.;Nutrition handout(s) given to patient.    Expected Outcomes Short Term Goal: Understand basic principles of dietary content, such as calories, fat, sodium, cholesterol and nutrients.;Long Term Goal: Adherence to prescribed nutrition plan.             Nutrition Assessments:  Nutrition Assessments - 10/13/22 1401       Rate Your Plate Scores   Pre Score 60            MEDIFICTS Score Key: >=70 Need to make dietary changes  40-70 Heart Healthy Diet <= 40 Therapeutic Level Cholesterol Diet   Flowsheet Row CARDIAC REHAB PHASE II EXERCISE from 10/13/2022 in [facility]  Picture Your Plate Total Score on Admission 60      Picture Your Plate Scores: <56 Unhealthy dietary pattern with much room for improvement. 41-50 Dietary pattern unlikely to meet recommendations for good health and room for improvement. 51-60 More healthful dietary pattern, with some room for improvement.  >60 Healthy dietary pattern, although there may be some specific behaviors that could be improved.    Nutrition Goals Re-Evaluation:  Nutrition Goals  Re-Evaluation     Row Name 10/13/22 1113 11/10/22 1132 12/11/22 1310          Goals   Current Weight 203 lb 11.3 oz (92.4 kg) 202 lb 13.2 oz (92 kg) 194 lb 7.1 oz (88.2 kg)     Comment trilglycerides 368 (taking fenofibrate), triglycerides 368 (taking fenofibrate) HDL 37, LDL 44, Triglycerides WNL     Expected Outcome Craig Flores reports no nutrition questions/concerns at this time. Per documentation, he has stopped alcohol ~4 months ago; he continues regular follow-up with behavioal health. He continues regular follow-up with anti-coagulation clinic. Trilgycerides remain elevated; taking fenofibrate. Patient will benefit from participation in intensive cardiac rehab for nutrition, exercise, and lifestyle modification. Goals in progress. Craig Flores continues to attend the [employer] and nutrition series regularly. He has started making many dietary changes including increased low calorie dense foods and increased high fiber foods. Per documentation, he has stopped alcohol ~4 months ago; he continues regular follow-up with behavioal health. He continues regular follow-up with anti-coagulation clinic. Triglycerides remain elevated; taking fenofibrate. He is down 2# since starting with our program; his goal weight is ~185#. Patient will benefit from participation in intensive cardiac rehab for nutrition, exercise, and lifestyle modification. Goals in progress. Craig Flores continues to attend the [employer] and nutrition series regularly. He has started making many dietary changes including increased low calorie dense foods and increased high fiber foods. Per documentation, he has stopped alcohol ~4 months ago; he continues regular follow-up with behavioal health. His triglycerides have improved WNL. He continues regular follow-up with anti-coagulation clinic. He is down 10.3## since starting with our program; his goal weight is ~185#. Patient will benefit from participation in intensive cardiac rehab for nutrition, exercise, and lifestyle modification.              Nutrition  Goals Re-Evaluation:  Nutrition Goals Re-Evaluation     Row Name 10/13/22 1113 11/10/22 1132 12/11/22 1310         Goals   Current Weight 203 lb 11.3 oz (92.4 kg) 202 lb 13.2 oz (92 kg) 194 lb 7.1 oz (88.2 kg)     Comment trilglycerides 368 (taking fenofibrate), triglycerides 368 (taking fenofibrate) HDL 37, LDL 44, Triglycerides WNL     Expected Outcome Craig Flores reports no nutrition questions/concerns at this time. Per documentation, he has stopped alcohol ~4 months ago; he continues regular follow-up with behavioal health. He continues regular follow-up with anti-coagulation clinic. Trilgycerides remain elevated; taking fenofibrate. Patient will benefit from participation in intensive cardiac rehab for nutrition, exercise, and lifestyle modification. Goals in progress. Craig Flores continues to attend the [employer] and nutrition series regularly. He has started making many dietary changes including increased low calorie dense foods and increased high fiber foods. Per documentation, he has stopped alcohol ~4 months ago; he continues regular follow-up with behavioal health. He continues regular follow-up with anti-coagulation clinic. Triglycerides remain elevated; taking fenofibrate. He is down 2# since starting with our program; his goal weight is ~185#. Patient will benefit from participation in intensive cardiac rehab for nutrition, exercise, and lifestyle modification. Goals in progress. Craig Flores continues to attend the [employer] and nutrition series regularly. He has started making many dietary changes including increased low calorie dense foods and increased high fiber foods. Per documentation, he has stopped alcohol ~4 months ago; he continues regular follow-up with behavioal health. His triglycerides have improved WNL. He continues regular follow-up with anti-coagulation clinic. He is down 10.3## since starting with our program; his goal weight  is ~185#. Patient will benefit from participation  in intensive cardiac rehab for nutrition, exercise, and lifestyle modification.              Nutrition Goals Discharge (Final Nutrition Goals Re-Evaluation):  Nutrition Goals Re-Evaluation - 12/11/22 1310       Goals   Current Weight 194 lb 7.1 oz (88.2 kg)    Comment HDL 37, LDL 44, Triglycerides WNL    Expected Outcome Goals in progress. Craig Flores continues to attend the [employer] and nutrition series regularly. He has started making many dietary changes including increased low calorie dense foods and increased high fiber foods. Per documentation, he has stopped alcohol ~4 months ago; he continues regular follow-up with behavioal health. His triglycerides have improved WNL. He continues regular follow-up with anti-coagulation clinic. He is down 10.3## since starting with our program; his goal weight is ~185#. Patient will benefit from participation in intensive cardiac rehab for nutrition, exercise, and lifestyle modification.             Psychosocial: Target Goals: Acknowledge presence or absence of significant depression and/or stress, maximize coping skills, provide positive support system. Participant is able to verbalize types and ability to use techniques and skills needed for reducing stress and depression.  Initial Review & Psychosocial Screening:  Initial Psych Review & Screening - 10/09/22 1323       Initial Review   Current issues with Current Depression;History of Depression;Current Stress Concerns    Source of Stress Concerns Financial    Comments Stess mostly related to finances      Family Dynamics   Good Support System? Yes   child and wife     Barriers   Psychosocial barriers to participate in program The patient should benefit from training in stress management and relaxation.      Screening Interventions   Interventions Encouraged to exercise;To provide support and resources with identified psychosocial needs;Provide feedback about the scores to  participant    Expected Outcomes Long Term goal: The participant improves quality of Life and PHQ9 Scores as seen by post scores and/or verbalization of changes;Short Term goal: Identification and review with participant of any Quality of Life or Depression concerns found by scoring the questionnaire.             Quality of Life Scores:  Quality of Life - 10/09/22 1538       Quality of Life   Select Quality of Life      Quality of Life Scores   Health/Function Pre 9.53 %    Socioeconomic Pre 13.93 %    Psych/Spiritual Pre 5.79 %    Family Pre 23.7 %    GLOBAL Pre 11.75 %            Scores of 19 and below usually indicate a poorer quality of life in these areas.  A difference of  2-3 points is a clinically meaningful difference.  A difference of 2-3 points in the total score of the Quality of Life Index has been associated with significant improvement in overall quality of life, self-image, physical symptoms, and general health in studies assessing change in quality of life.  PHQ-9: Review Flowsheet  More data exists      12/17/2022 11/14/2022 10/09/2022 08/15/2022 06/06/2022  Depression screen PHQ 2/9  Decreased Interest   1    Down, Depressed, Hopeless   1    PHQ - 2 Score   2    Altered sleeping   0  Tired, decreased energy   2    Change in appetite   0    Feeling bad or failure about yourself    1    Trouble concentrating   1    Moving slowly or fidgety/restless   1    Suicidal thoughts   0    PHQ-9 Score   7    Difficult doing work/chores  - Somewhat difficult      Details       Information is confidential and restricted. Go to Review Flowsheets to unlock data.        Interpretation of Total Score  Total Score Depression Severity:  1-4 = Minimal depression, 5-9 = Mild depression, 10-14 = Moderate depression, 15-19 = Moderately severe depression, 20-27 = Severe depression   Psychosocial Evaluation and Intervention:   Psychosocial Re-Evaluation:   Psychosocial Re-Evaluation     Row Name 10/13/22 1445 10/16/22 1621 11/18/22 1142 12/11/22 1354 01/07/23 0843     Psychosocial Re-Evaluation   Current issues with Current Depression;History of Depression;Current Stress Concerns Current Depression;History of Depression;Current Stress Concerns Current Depression;History of Depression;Current Stress Concerns Current Depression;History of Depression;Current Stress Concerns Current Depression;History of Depression;Current Stress Concerns   Comments Craig Flores started cardiac rehab on 10/13/22. Craig Flores did not voice any increased concerns or stressors.Pt started cardiac rehab today.  Pt tolerated light exercise without difficulty. VSS, telemetry-Sinus Rhythm, bundle branch, block asymptomatic. Craig Flores started cardiac rehab on 10/13/22. Craig Flores did not voice any increased concerns or stressors. Will review quality of life in the upcoming week Quality of life reviewed on 10/24/22. Craig Flores admits to being depressed. Craig Flores says the antidepressants he is taking is controlling his depression. Craig Flores is not interested in counseling at this time. Craig Flores says he has received counseling in the past.Craig Flores says he is dissatisfied with his health and does not think he will live long due to his heart failure diagnosis. Craig Flores is currently not able to work. Craig Flores's wife works. Craig Flores is interested in applying for disability.  The [facility] Social Worker has followed up with the patient regarding this. Craig Flores has not voiced any increased concerns or stressors during exercise at cardiac rehab. Craig Flores was contacted by the HF social worker about applying for disability Craig Flores's last day of exercise was on 12/22/22.   Expected Outcomes -- Craig Flores will have decreased or controlled stress and depression upon completion of cardiac rehab. Craig Flores will have decreased or controlled stress and depression upon completion of cardiac rehab. Craig Flores will have decreased or controlled stress and depression upon completion of cardiac  rehab. Craig Flores will have decreased or controlled stress and depression upon completion of cardiac rehab.   Interventions -- Stress management education;Encouraged to attend Cardiac Rehabilitation for the exercise;Relaxation education Stress management education;Encouraged to attend Cardiac Rehabilitation for the exercise;Relaxation education Stress management education;Encouraged to attend Cardiac Rehabilitation for the exercise;Relaxation education Stress management education;Encouraged to attend Cardiac Rehabilitation for the exercise;Relaxation education   Continue Psychosocial Services  -- -- Follow up required by staff Follow up required by staff Follow up required by staff     Initial Review   Source of Stress Concerns -- Financial Financial;Retirement/disability;Chronic Illness Financial;Retirement/disability;Chronic Illness Financial;Retirement/disability;Chronic Illness   Comments -- Will continue to monitor and offer support as needed Will continue to monitor and offer support as needed Will continue to monitor and offer support as needed Will continue to monitor and offer support as needed            Psychosocial Discharge (Final Psychosocial  Re-Evaluation):  Psychosocial Re-Evaluation - 01/07/23 0843       Psychosocial Re-Evaluation   Current issues with Current Depression;History of Depression;Current Stress Concerns    Comments Craig Flores's last day of exercise was on 12/22/22.    Expected Outcomes Craig Flores will have decreased or controlled stress and depression upon completion of cardiac rehab.    Interventions Stress management education;Encouraged to attend Cardiac Rehabilitation for the exercise;Relaxation education    Continue Psychosocial Services  Follow up required by staff      Initial Review   Source of Stress Concerns Financial;Retirement/disability;Chronic Illness    Comments Will continue to monitor and offer support as needed             Vocational  Rehabilitation: Provide vocational rehab assistance to qualifying candidates.   Vocational Rehab Evaluation & Intervention:  Vocational Rehab - 10/09/22 1541       Initial Vocational Rehab Evaluation & Intervention   Assessment shows need for Vocational Rehabilitation No   No needs at this time. He will let us know if he needs resources in the future            Education: Education Goals: Education classes will be provided on a weekly basis, covering required topics. Participant will state understanding/return demonstration of topics presented.    Education     Row Name 10/13/22 1500     Education   Cardiac Education Topics Pritikin     Workshops   Educator Exercise Physiologist   Select Psychosocial   Psychosocial Workshop Recognizing and Reducing Stress   Instruction Review Code 1- Verbalizes Understanding   Class Start Time 1148   Class Stop Time 1237   Class Time Calculation (min) 49 min    Row Name 10/15/22 1400     Education   Cardiac Education Topics Pritikin   Orthoptist   Educator Dietitian   Weekly Topic Tasty Appetizers and Snacks   Instruction Review Code 1- Verbalizes Understanding   Class Start Time 1145   Class Stop Time 1225   Class Time Calculation (min) 40 min    Row Name 10/17/22 1200     Education   Cardiac Education Topics Pritikin   Nurse, children's Exercise Physiologist   Select Nutrition   Nutrition Calorie Density   Instruction Review Code 1- Verbalizes Understanding   Class Start Time 1155   Class Stop Time 1235   Class Time Calculation (min) 40 min    Row Name 10/20/22 1300     Education   Cardiac Education Topics Pritikin   [employer]     Workshops   Educator Exercise Physiologist   Select Exercise   Exercise Workshop Exercise Basics: Building Your Action Plan   Instruction Review Code 1- Verbalizes Understanding   Class Start Time 1155   Class Stop  Time 1237   Class Time Calculation (min) 42 min    Row Name 10/22/22 1500     Education   Cardiac Education Topics Pritikin   Customer service manager   Weekly Topic Efficiency Cooking - Meals in a Snap   Instruction Review Code 1- Verbalizes Understanding   Class Start Time 1145   Class Stop Time 1224   Class Time Calculation (min) 39 min    Row Name 10/27/22 1300     Education   Cardiac Education Topics Pritikin  Glass blower/designer Nutrition   Nutrition Workshop Targeting Your Nutrition Priorities   Instruction Review Code 1- Teaching laboratory technician Start Time 1145   Class Stop Time 1225   Class Time Calculation (min) 40 min    Row Name 10/29/22 1200     Education   Cardiac Education Topics Pritikin   Customer service manager   Weekly Topic One-Pot Wonders   Instruction Review Code 1- Verbalizes Understanding   Class Start Time 1145   Class Stop Time 1225   Class Time Calculation (min) 40 min    Row Name 10/31/22 1200     Education   Cardiac Education Topics Pritikin   Hospital doctor Education   General Education Hypertension and Heart Disease   Instruction Review Code 1- Verbalizes Understanding   Class Start Time 1157   Class Stop Time 1240   Class Time Calculation (min) 43 min    Row Name 11/03/22 1300     Education   Cardiac Education Topics Pritikin   Licensed conveyancer Nutrition   Nutrition Dining Out - Part 1   Instruction Review Code 1- Verbalizes Understanding   Class Start Time 1145   Class Stop Time 1230   Class Time Calculation (min) 45 min    Row Name 11/07/22 1500     Education   Cardiac Education Topics Pritikin   Immunologist Exercise Physiologist    Select Psychosocial   Psychosocial Workshop Focused Goals, Sustainable Changes   Instruction Review Code 1- Verbalizes Understanding   Class Start Time 1145   Class Stop Time 1230   Class Time Calculation (min) 45 min    Row Name 11/10/22 1200     Education   Cardiac Education Topics Pritikin   Psychologist, forensic Exercise Education   Exercise Education Biomechanial Limitations   Instruction Review Code 1- Verbalizes Understanding   Class Start Time 1155   Class Stop Time 1235   Class Time Calculation (min) 40 min    Row Name 11/12/22 1600     Education   Cardiac Education Topics Pritikin   Customer service manager   Weekly Topic Fast Evening Meals   Instruction Review Code 1- Verbalizes Understanding   Class Start Time 1145   Class Stop Time 1230   Class Time Calculation (min) 45 min    Row Name 11/14/22 1400     Education   Cardiac Education Topics Pritikin   Licensed conveyancer Nutrition   Nutrition Vitamins and Minerals   Instruction Review Code 1- Verbalizes Understanding   Class Start Time 1145   Class Stop Time 1226   Class Time Calculation (min) 41 min    Row Name 11/17/22 1300     Education   Cardiac Education Topics Pritikin   Glass blower/designer Nutrition   Nutrition Workshop Fueling a Forensic psychologist   Instruction Review Code 1-  Verbalizes Understanding   Class Start Time 1145   Class Stop Time 1230   Class Time Calculation (min) 45 min    Row Name 11/24/22 1300     Education   Cardiac Education Topics Pritikin   Geographical information systems officer Psychosocial   Psychosocial Workshop Healthy Sleep for a Healthy Heart   Instruction Review Code 1- Verbalizes Understanding   Class Start Time 1148   Class Stop Time 1240    Class Time Calculation (min) 52 min    Row Name 11/26/22 1500     Education   Cardiac Education Topics Pritikin   Customer service manager   Weekly Topic Simple Sides and Sauces   Instruction Review Code 1- Verbalizes Understanding   Class Start Time 1145   Class Stop Time 1226   Class Time Calculation (min) 41 min    Row Name 11/28/22 1100     Education   Cardiac Education Topics Pritikin   Nurse, children's Exercise Physiologist   Select Psychosocial   Psychosocial How Our Thoughts Can Heal Our Hearts   Instruction Review Code 1- Verbalizes Understanding   Class Start Time 1148   Class Stop Time 1230   Class Time Calculation (min) 42 min    Row Name 12/01/22 1200     Education   Cardiac Education Topics Pritikin   Select Workshops     Workshops   Educator Exercise Physiologist   Select Exercise   Exercise Workshop Managing Heart Disease: Your Path to a Healthier Heart   Instruction Review Code 1- Verbalizes Understanding   Class Start Time 1135   Class Stop Time 1219   Class Time Calculation (min) 44 min    Row Name 12/03/22 1500     Education   Cardiac Education Topics Pritikin   Orthoptist   Educator Dietitian   Weekly Topic Powerhouse Plant-Based Proteins   Instruction Review Code 1- Verbalizes Understanding   Class Start Time 1145   Class Stop Time 1222   Class Time Calculation (min) 37 min    Row Name 12/05/22 1200     Education   Cardiac Education Topics Pritikin   Hospital doctor Education   General Education Hypertension and Heart Disease   Instruction Review Code 1- Verbalizes Understanding   Class Start Time 1153   Class Stop Time 1235   Class Time Calculation (min) 42 min    Row Name 12/08/22 1600     Education   Cardiac Education Topics Pritikin   Customer service manager Psychosocial   Psychosocial Workshop From Head to Heart: The Power of a Healthy Outlook   Instruction Review Code 1- Verbalizes Understanding   Class Start Time 1150   Class Stop Time 1235   Class Time Calculation (min) 45 min    Row Name 12/10/22 1300     Education   Cardiac Education Topics Pritikin   Customer service manager   Weekly Topic Adding Flavor - Sodium-Free   Instruction Review Code 1- Verbalizes Understanding   Class Start Time 1140   Class Stop  Time 1220   Class Time Calculation (min) 40 min    Row Name 12/12/22 1200     Education   Cardiac Education Topics Pritikin   Hospital doctor Education   General Education Heart Disease Risk Reduction   Instruction Review Code 1- Verbalizes Understanding   Class Start Time 1157   Class Stop Time 1234   Class Time Calculation (min) 37 min    Row Name 12/15/22 1300     Education   Cardiac Education Topics Pritikin   Select Workshops     Workshops   Educator Exercise Physiologist   Select Exercise   Exercise Workshop Location manager and Fall Prevention   Instruction Review Code 1- Verbalizes Understanding   Class Start Time 1150   Class Stop Time 1240   Class Time Calculation (min) 50 min    Row Name 12/19/22 1300     Education   Cardiac Education Topics Pritikin   Licensed conveyancer Nutrition   Nutrition Overview of the Pritikin Eating Plan   Instruction Review Code 1- Verbalizes Understanding   Class Start Time 1145   Class Stop Time 1221   Class Time Calculation (min) 36 min    Row Name 12/22/22 1300     Education   Cardiac Education Topics Pritikin   Nurse, children's Exercise Physiologist   Select Psychosocial   Psychosocial Healthy Minds,  Bodies, Hearts   Instruction Review Code 1- Verbalizes Understanding   Class Start Time 1142   Class Stop Time 1218   Class Time Calculation (min) 36 min            Core Videos: Exercise    Move It!  Clinical staff conducted group or individual video education with verbal and written material and guidebook.  Patient learns the recommended Pritikin exercise program. Exercise with the goal of living a long, healthy life. Some of the health benefits of exercise include controlled diabetes, healthier blood pressure levels, improved cholesterol levels, improved heart and lung capacity, improved sleep, and better body composition. Everyone should speak with their doctor before starting or changing an exercise routine.  Biomechanical Limitations Clinical staff conducted group or individual video education with verbal and written material and guidebook.  Patient learns how biomechanical limitations can impact exercise and how we can mitigate and possibly overcome limitations to have an impactful and balanced exercise routine.  Body Composition Clinical staff conducted group or individual video education with verbal and written material and guidebook.  Patient learns that body composition (ratio of muscle mass to fat mass) is a key component to assessing overall fitness, rather than body weight alone. Increased fat mass, especially visceral belly fat, can put [place] at increased risk for metabolic syndrome, type 2 diabetes, heart disease, and even death. It is recommended to combine diet and exercise (cardiovascular and resistance training) to improve your body composition. Seek guidance from your physician and exercise physiologist before implementing an exercise routine.  Exercise Action Plan Clinical staff conducted group or individual video education with verbal and written material and guidebook.  Patient learns the recommended strategies to achieve and enjoy long-term exercise adherence,  including variety, self-motivation, self-efficacy, and positive decision making. Benefits of exercise include fitness, good health, weight management, more energy, better sleep, less stress, and overall  well-being.  Medical   Heart Disease Risk Reduction Clinical staff conducted group or individual video education with verbal and written material and guidebook.  Patient learns our heart is our most vital organ as it circulates oxygen, nutrients, white blood cells, and hormones throughout the entire body, and carries waste away. Data supports a plant-based eating plan like the Pritikin Program for its effectiveness in slowing progression of and reversing heart disease. The video provides a number of recommendations to address heart disease.   Metabolic Syndrome and Belly Fat  Clinical staff conducted group or individual video education with verbal and written material and guidebook.  Patient learns what metabolic syndrome is, how it leads to heart disease, and how one can reverse it and keep it from coming back. You have metabolic syndrome if you have 3 of the following 5 criteria: abdominal obesity, high blood pressure, high triglycerides, low HDL cholesterol, and high blood sugar.  Hypertension and Heart Disease Clinical staff conducted group or individual video education with verbal and written material and guidebook.  Patient learns that high blood pressure, or hypertension, is very common in the [place]. Hypertension is largely due to excessive salt intake, but other important risk factors include being overweight, physical inactivity, drinking too much alcohol, smoking, and not eating enough potassium from fruits and vegetables. High blood pressure is a leading risk factor for heart attack, stroke, congestive heart failure, dementia, kidney failure, and premature death. Long-term effects of excessive salt intake include stiffening of the arteries and thickening of heart muscle and organ  damage. Recommendations include ways to reduce hypertension and the risk of heart disease.  Diseases of Our Time - Focusing on Diabetes Clinical staff conducted group or individual video education with verbal and written material and guidebook.  Patient learns why the best way to stop diseases of our time is prevention, through food and other lifestyle changes. Medicine (such as prescription pills and surgeries) is often only a Band-Aid on the problem, not a long-term solution. Most common diseases of our time include obesity, type 2 diabetes, hypertension, heart disease, and cancer. The Pritikin Program is recommended and has been proven to help reduce, reverse, and/or prevent the damaging effects of metabolic syndrome.  Nutrition   Overview of the Pritikin Eating Plan  Clinical staff conducted group or individual video education with verbal and written material and guidebook.  Patient learns about the Pritikin Eating Plan for disease risk reduction. The Pritikin Eating Plan emphasizes a wide variety of unrefined, minimally-processed carbohydrates, like fruits, vegetables, whole grains, and legumes. Go, Caution, and Stop food choices are explained. Plant-based and lean animal proteins are emphasized. Rationale provided for low sodium intake for blood pressure control, low added sugars for blood sugar stabilization, and low added fats and oils for coronary artery disease risk reduction and weight management.  Calorie Density  Clinical staff conducted group or individual video education with verbal and written material and guidebook.  Patient learns about calorie density and how it impacts the Pritikin Eating Plan. Knowing the characteristics of the food you choose will help you decide whether those foods will lead to weight gain or weight loss, and whether you want to consume more or less of them. Weight loss is usually a side effect of the Pritikin Eating Plan because of its focus on low calorie-dense  foods.  Label Reading  Clinical staff conducted group or individual video education with verbal and written material and guidebook.  Patient learns about the Pritikin recommended label  reading guidelines and corresponding recommendations regarding calorie density, added sugars, sodium content, and whole grains.  Dining Out - Part 1  Clinical staff conducted group or individual video education with verbal and written material and guidebook.  Patient learns that restaurant meals can be sabotaging because they can be so high in calories, fat, sodium, and/or sugar. Patient learns recommended strategies on how to positively address this and avoid unhealthy pitfalls.  Facts on Fats  Clinical staff conducted group or individual video education with verbal and written material and guidebook.  Patient learns that lifestyle modifications can be just as effective, if not more so, as many medications for lowering your risk of heart disease. A Pritikin lifestyle can help to reduce your risk of inflammation and atherosclerosis (cholesterol build-up, or plaque, in the artery walls). Lifestyle interventions such as dietary choices and physical activity address the cause of atherosclerosis. A review of the types of fats and their impact on blood cholesterol levels, along with dietary recommendations to reduce fat intake is also included.  Nutrition Action Plan  Clinical staff conducted group or individual video education with verbal and written material and guidebook.  Patient learns how to incorporate Pritikin recommendations into their lifestyle. Recommendations include planning and keeping personal health goals in mind as an important part of their success.  Healthy Mind-Set    Healthy Minds, Bodies, Hearts  Clinical staff conducted group or individual video education with verbal and written material and guidebook.  Patient learns how to identify when they are stressed. Video will discuss the impact of that  stress, as well as the many benefits of stress management. Patient will also be introduced to stress management techniques. The way we think, act, and feel has an impact on our hearts.  How Our Thoughts Can Heal Our Hearts  Clinical staff conducted group or individual video education with verbal and written material and guidebook.  Patient learns that negative thoughts can cause depression and anxiety. This can result in negative lifestyle behavior and serious health problems. Cognitive behavioral therapy is an effective method to help control our thoughts in order to change and improve our emotional outlook.  Additional Videos:  Exercise    Improving Performance  Clinical staff conducted group or individual video education with verbal and written material and guidebook.  Patient learns to use a non-linear approach by alternating intensity levels and lengths of time spent exercising to help burn more calories and lose more body fat. Cardiovascular exercise helps improve heart health, metabolism, hormonal balance, blood sugar control, and recovery from fatigue. Resistance training improves strength, endurance, balance, coordination, reaction time, metabolism, and muscle mass. Flexibility exercise improves circulation, posture, and balance. Seek guidance from your physician and exercise physiologist before implementing an exercise routine and learn your capabilities and proper form for all exercise.  Introduction to Yoga  Clinical staff conducted group or individual video education with verbal and written material and guidebook.  Patient learns about yoga, a discipline of the coming together of mind, breath, and body. The benefits of yoga include improved flexibility, improved range of motion, better posture and core strength, increased lung function, weight loss, and positive self-image. Yoga's heart health benefits include lowered blood pressure, healthier heart rate, decreased cholesterol and  triglyceride levels, improved immune function, and reduced stress. Seek guidance from your physician and exercise physiologist before implementing an exercise routine and learn your capabilities and proper form for all exercise.  Medical   Aging: Enhancing Your Quality of Life  Clinical staff  conducted group or individual video education with verbal and written material and guidebook.  Patient learns key strategies and recommendations to stay in good physical health and enhance quality of life, such as prevention strategies, having an advocate, securing a Health Care Proxy and Power of Attorney, and keeping a list of medications and system for tracking them. It also discusses how to avoid risk for bone loss.  Biology of Weight Control  Clinical staff conducted group or individual video education with verbal and written material and guidebook.  Patient learns that weight gain occurs because we consume more calories than we burn (eating more, moving less). Even if your body weight is normal, you may have higher ratios of fat compared to muscle mass. Too much body fat puts you at increased risk for cardiovascular disease, heart attack, stroke, type 2 diabetes, and obesity-related cancers. In addition to exercise, following the Pritikin Eating Plan can help reduce your risk.  Decoding Lab Results  Clinical staff conducted group or individual video education with verbal and written material and guidebook.  Patient learns that lab test reflects one measurement whose values change over time and are influenced by many factors, including medication, stress, sleep, exercise, food, hydration, pre-existing medical conditions, and more. It is recommended to use the knowledge from this video to become more involved with your lab results and evaluate your numbers to speak with your doctor.   Diseases of Our Time - Overview  Clinical staff conducted group or individual video education with verbal and written  material and guidebook.  Patient learns that according to the CDC, 50% to 70% of chronic diseases (such as obesity, type 2 diabetes, elevated lipids, hypertension, and heart disease) are avoidable through lifestyle improvements including healthier food choices, listening to satiety cues, and increased physical activity.  Sleep Disorders Clinical staff conducted group or individual video education with verbal and written material and guidebook.  Patient learns how good quality and duration of sleep are important to overall health and well-being. Patient also learns about sleep disorders and how they impact health along with recommendations to address them, including discussing with a physician.  Nutrition  Dining Out - Part 2 Clinical staff conducted group or individual video education with verbal and written material and guidebook.  Patient learns how to plan ahead and communicate in order to maximize their dining experience in a healthy and nutritious manner. Included are recommended food choices based on the type of restaurant the patient is visiting.   Fueling a Banker conducted group or individual video education with verbal and written material and guidebook.  There is a strong connection between our food choices and our health. Diseases like obesity and type 2 diabetes are very prevalent and are in large-part due to lifestyle choices. The Pritikin Eating Plan provides plenty of food and hunger-curbing satisfaction. It is easy to follow, affordable, and helps reduce health risks.  Menu Workshop  Clinical staff conducted group or individual video education with verbal and written material and guidebook.  Patient learns that restaurant meals can sabotage health goals because they are often packed with calories, fat, sodium, and sugar. Recommendations include strategies to plan ahead and to communicate with the manager, chef, or server to help order a healthier  meal.  Planning Your Eating Strategy  Clinical staff conducted group or individual video education with verbal and written material and guidebook.  Patient learns about the Pritikin Eating Plan and its benefit of reducing the risk of disease.  The Pritikin Eating Plan does not focus on calories. Instead, it emphasizes high-quality, nutrient-rich foods. By knowing the characteristics of the foods, we choose, we can determine their calorie density and make informed decisions.  Targeting Your Nutrition Priorities  Clinical staff conducted group or individual video education with verbal and written material and guidebook.  Patient learns that lifestyle habits have a tremendous impact on disease risk and progression. This video provides eating and physical activity recommendations based on your personal health goals, such as reducing LDL cholesterol, losing weight, preventing or controlling type 2 diabetes, and reducing high blood pressure.  Vitamins and Minerals  Clinical staff conducted group or individual video education with verbal and written material and guidebook.  Patient learns different ways to obtain key vitamins and minerals, including through a recommended healthy diet. It is important to discuss all supplements you take with your doctor.   Healthy Mind-Set    Smoking Cessation  Clinical staff conducted group or individual video education with verbal and written material and guidebook.  Patient learns that cigarette smoking and tobacco addiction pose a serious health risk which affects millions of people. Stopping smoking will significantly reduce the risk of heart disease, lung disease, and many forms of cancer. Recommended strategies for quitting are covered, including working with your doctor to develop a successful plan.  Culinary   Becoming a Set designer conducted group or individual video education with verbal and written material and guidebook.  Patient learns  that cooking at home can be healthy, cost-effective, quick, and puts them in control. Keys to cooking healthy recipes will include looking at your recipe, assessing your equipment needs, planning ahead, making it simple, choosing cost-effective seasonal ingredients, and limiting the use of added fats, salts, and sugars.  Cooking - Breakfast and Snacks  Clinical staff conducted group or individual video education with verbal and written material and guidebook.  Patient learns how important breakfast is to satiety and nutrition through the entire day. Recommendations include key foods to eat during breakfast to help stabilize blood sugar levels and to prevent overeating at meals later in the day. Planning ahead is also a key component.  Cooking - Educational psychologist conducted group or individual video education with verbal and written material and guidebook.  Patient learns eating strategies to improve overall health, including an approach to cook more at home. Recommendations include thinking of animal protein as a side on your plate rather than center stage and focusing instead on lower calorie dense options like vegetables, fruits, whole grains, and plant-based proteins, such as beans. Making sauces in large quantities to freeze for later and leaving the skin on your vegetables are also recommended to maximize your experience.  Cooking - Healthy Salads and Dressing Clinical staff conducted group or individual video education with verbal and written material and guidebook.  Patient learns that vegetables, fruits, whole grains, and legumes are the foundations of the Pritikin Eating Plan. Recommendations include how to incorporate each of these in flavorful and healthy salads, and how to create homemade salad dressings. Proper handling of ingredients is also covered. Cooking - Soups and [employer] - Soups and Desserts Clinical staff conducted group or individual video education with  verbal and written material and guidebook.  Patient learns that Pritikin soups and desserts make for easy, nutritious, and delicious snacks and meal components that are low in sodium, fat, sugar, and calorie density, while high in vitamins, minerals, and filling fiber. Recommendations  include simple and healthy ideas for soups and desserts.   Overview     The Pritikin Solution Program Overview Clinical staff conducted group or individual video education with verbal and written material and guidebook.  Patient learns that the results of the Pritikin Program have been documented in more than 100 articles published in peer-reviewed journals, and the benefits include reducing risk factors for (and, in some cases, even reversing) high cholesterol, high blood pressure, type 2 diabetes, obesity, and more! An overview of the three key pillars of the Pritikin Program will be covered: eating well, doing regular exercise, and having a healthy mind-set.  WORKSHOPS  Exercise: Exercise Basics: Building Your Action Plan Clinical staff led group instruction and group discussion with PowerPoint presentation and patient guidebook. To enhance the learning environment the use of posters, models and videos may be added. At the conclusion of this workshop, patients will comprehend the difference between physical activity and exercise, as well as the benefits of incorporating both, into their routine. Patients will understand the FITT (Frequency, Intensity, Time, and Type) principle and how to use it to build an exercise action plan. In addition, safety concerns and other considerations for exercise and cardiac rehab will be addressed by the presenter. The purpose of this lesson is to promote a comprehensive and effective weekly exercise routine in order to improve patients' overall level of fitness.   Managing Heart Disease: Your Path to a Healthier Heart Clinical staff led group instruction and group discussion with  PowerPoint presentation and patient guidebook. To enhance the learning environment the use of posters, models and videos may be added.At the conclusion of this workshop, patients will understand the anatomy and physiology of the heart. Additionally, they will understand how Pritikin's three pillars impact the risk factors, the progression, and the management of heart disease.  The purpose of this lesson is to provide a high-level overview of the heart, heart disease, and how the Pritikin lifestyle positively impacts risk factors.  Exercise Biomechanics Clinical staff led group instruction and group discussion with PowerPoint presentation and patient guidebook. To enhance the learning environment the use of posters, models and videos may be added. Patients will learn how the structural parts of their bodies function and how these functions impact their daily activities, movement, and exercise. Patients will learn how to promote a neutral spine, learn how to manage pain, and identify ways to improve their physical movement in order to promote healthy living. The purpose of this lesson is to expose patients to common physical limitations that impact physical activity. Participants will learn practical ways to adapt and manage aches and pains, and to minimize their effect on regular exercise. Patients will learn how to maintain good posture while sitting, walking, and lifting.  Balance Training and Fall Prevention  Clinical staff led group instruction and group discussion with PowerPoint presentation and patient guidebook. To enhance the learning environment the use of posters, models and videos may be added. At the conclusion of this workshop, patients will understand the importance of their sensorimotor skills (vision, proprioception, and the vestibular system) in maintaining their ability to balance as they age. Patients will apply a variety of balancing exercises that are appropriate for their  current level of function. Patients will understand the common causes for poor balance, possible solutions to these problems, and ways to modify their physical environment in order to minimize their fall risk. The purpose of this lesson is to teach patients about the importance of maintaining balance as they  age and ways to minimize their risk of falling.  WORKSHOPS   Nutrition:  Fueling a Ship broker led group instruction and group discussion with PowerPoint presentation and patient guidebook. To enhance the learning environment the use of posters, models and videos may be added. Patients will review the foundational principles of the Pritikin Eating Plan and understand what constitutes a serving size in each of the food groups. Patients will also learn Pritikin-friendly foods that are better choices when away from home and review make-ahead meal and snack options. Calorie density will be reviewed and applied to three nutrition priorities: weight maintenance, weight loss, and weight gain. The purpose of this lesson is to reinforce (in a group setting) the key concepts around what patients are recommended to eat and how to apply these guidelines when away from home by planning and selecting Pritikin-friendly options. Patients will understand how calorie density may be adjusted for different weight management goals.  Mindful Eating  Clinical staff led group instruction and group discussion with PowerPoint presentation and patient guidebook. To enhance the learning environment the use of posters, models and videos may be added. Patients will briefly review the concepts of the Pritikin Eating Plan and the importance of low-calorie dense foods. The concept of mindful eating will be introduced as well as the importance of paying attention to internal hunger signals. Triggers for non-hunger eating and techniques for dealing with triggers will be explored. The purpose of this lesson is to provide  patients with the opportunity to review the basic principles of the Pritikin Eating Plan, discuss the value of eating mindfully and how to measure internal cues of hunger and fullness using the Hunger Scale. Patients will also discuss reasons for non-hunger eating and learn strategies to use for controlling emotional eating.  Targeting Your Nutrition Priorities Clinical staff led group instruction and group discussion with PowerPoint presentation and patient guidebook. To enhance the learning environment the use of posters, models and videos may be added. Patients will learn how to determine their genetic susceptibility to disease by reviewing their family history. Patients will gain insight into the importance of diet as part of an overall healthy lifestyle in mitigating the impact of genetics and other environmental insults. The purpose of this lesson is to provide patients with the opportunity to assess their personal nutrition priorities by looking at their family history, their own health history and current risk factors. Patients will also be able to discuss ways of prioritizing and modifying the Pritikin Eating Plan for their highest risk areas  Menu  Clinical staff led group instruction and group discussion with PowerPoint presentation and patient guidebook. To enhance the learning environment the use of posters, models and videos may be added. Using menus brought in from [employer], or printed from [employer], patients will apply the Pritikin dining out guidelines that were presented in the [employer] video. Patients will also be able to practice these guidelines in a variety of provided scenarios. The purpose of this lesson is to provide patients with the opportunity to practice hands-on learning of the Pritikin Dining Out guidelines with actual menus and practice scenarios.  Label Reading Clinical staff led group instruction and group discussion with PowerPoint  presentation and patient guidebook. To enhance the learning environment the use of posters, models and videos may be added. Patients will review and discuss the Pritikin label reading guidelines presented in Pritikin's Label Reading Educational series video. Using fool labels brought in from local grocery  stores and markets, patients will apply the label reading guidelines and determine if the packaged food meet the Pritikin guidelines. The purpose of this lesson is to provide patients with the opportunity to review, discuss, and practice hands-on learning of the Pritikin Label Reading guidelines with actual packaged food labels. Cooking School  Pritikin's [employer] are designed to teach patients ways to prepare quick, simple, and affordable recipes at home. The importance of nutrition's role in chronic disease risk reduction is reflected in its emphasis in the overall Pritikin program. By learning how to prepare essential core Pritikin Eating Plan recipes, patients will increase control over what they eat; be able to customize the flavor of foods without the use of added salt, sugar, or fat; and improve the quality of the food they consume. By learning a set of core recipes which are easily assembled, quickly prepared, and affordable, patients are more likely to prepare more healthy foods at home. These workshops focus on convenient breakfasts, simple entres, side dishes, and desserts which can be prepared with minimal effort and are consistent with nutrition recommendations for cardiovascular risk reduction. Cooking [employer] are taught by a Armed forces logistics/support/administrative officer (RD) who has been trained by the [employer]. The chef or RD has a clear understanding of the importance of minimizing - if not completely eliminating - added fat, sugar, and sodium in recipes. Throughout the series of Cooking School Workshop sessions, patients will learn about healthy ingredients and  efficient methods of cooking to build confidence in their capability to prepare    Cooking School weekly topics:  Adding Flavor- Sodium-Free  Fast and Healthy Breakfasts  Powerhouse Plant-Based Proteins  Satisfying Salads and Dressings  Simple Sides and Sauces  International Cuisine-Spotlight on the [employer] Zones  Delicious Desserts  Savory Soups  [employer] - Meals in a Astronomer Appetizers and Snacks  Comforting Weekend Breakfasts  One-Pot Wonders   Fast Evening Meals  Landscape architect Your Pritikin Plate  WORKSHOPS   Healthy Mindset (Psychosocial):  Focused Goals, Sustainable Changes Clinical staff led group instruction and group discussion with PowerPoint presentation and patient guidebook. To enhance the learning environment the use of posters, models and videos may be added. Patients will be able to apply effective goal setting strategies to establish at least one personal goal, and then take consistent, meaningful action toward that goal. They will learn to identify common barriers to achieving personal goals and develop strategies to overcome them. Patients will also gain an understanding of how our mind-set can impact our ability to achieve goals and the importance of cultivating a positive and growth-oriented mind-set. The purpose of this lesson is to provide patients with a deeper understanding of how to set and achieve personal goals, as well as the tools and strategies needed to overcome common obstacles which may arise along the way.  From Head to Heart: The Power of a Healthy Outlook  Clinical staff led group instruction and group discussion with PowerPoint presentation and patient guidebook. To enhance the learning environment the use of posters, models and videos may be added. Patients will be able to recognize and describe the impact of emotions and mood on physical health. They will discover the importance of self-care and explore self-care  practices which may work for them. Patients will also learn how to utilize the 4 C's to cultivate a healthier outlook and better manage stress and challenges. The purpose of this lesson is to demonstrate to patients  how a healthy outlook is an essential part of maintaining good health, especially as they continue their cardiac rehab journey.  Healthy Sleep for a Healthy Heart Clinical staff led group instruction and group discussion with PowerPoint presentation and patient guidebook. To enhance the learning environment the use of posters, models and videos may be added. At the conclusion of this workshop, patients will be able to demonstrate knowledge of the importance of sleep to overall health, well-being, and quality of life. They will understand the symptoms of, and treatments for, common sleep disorders. Patients will also be able to identify daytime and nighttime behaviors which impact sleep, and they will be able to apply these tools to help manage sleep-related challenges. The purpose of this lesson is to provide patients with a general overview of sleep and outline the importance of quality sleep. Patients will learn about a few of the most common sleep disorders. Patients will also be introduced to the concept of "sleep hygiene," and discover ways to self-manage certain sleeping problems through simple daily behavior changes. Finally, the workshop will motivate patients by clarifying the links between quality sleep and their goals of heart-healthy living.   Recognizing and Reducing Stress Clinical staff led group instruction and group discussion with PowerPoint presentation and patient guidebook. To enhance the learning environment the use of posters, models and videos may be added. At the conclusion of this workshop, patients will be able to understand the types of stress reactions, differentiate between acute and chronic stress, and recognize the impact that chronic stress has on their health. They  will also be able to apply different coping mechanisms, such as reframing negative self-talk. Patients will have the opportunity to practice a variety of stress management techniques, such as deep abdominal breathing, progressive muscle relaxation, and/or guided imagery.  The purpose of this lesson is to educate patients on the role of stress in their lives and to provide healthy techniques for coping with it.  Learning Barriers/Preferences:  Learning Barriers/Preferences - 10/09/22 1539       Learning Barriers/Preferences   Learning Barriers Sight;Exercise Concerns   he wears glasses, history of falls   Learning Preferences Audio;Computer/Internet;Group Instruction;Individual Instruction;Pictoral;Skilled Demonstration;Verbal Instruction;Video;Written Material             Education Topics:  Knowledge Questionnaire Score:  Knowledge Questionnaire Score - 10/09/22 1523       Knowledge Questionnaire Score   Pre Score 24/24             Core Components/Risk Factors/Patient Goals at Admission:  Personal Goals and Risk Factors at Admission - 10/09/22 1358       Core Components/Risk Factors/Patient Goals on Admission    Weight Management Yes    Intervention Weight Management: Develop a combined nutrition and exercise program designed to reach desired caloric intake, while maintaining appropriate intake of nutrient and fiber, sodium and fats, and appropriate energy expenditure required for the weight goal.;Weight Management: Provide education and appropriate resources to help participant work on and attain dietary goals.    Expected Outcomes Short Term: Continue to assess and modify interventions until short term weight is achieved;Long Term: Adherence to nutrition and physical activity/exercise program aimed toward attainment of established weight goal;Understanding recommendations for meals to include 15-35% energy as protein, 25-35% energy from fat, 35-60% energy from carbohydrates,  less than 200mg  of dietary cholesterol, 20-35 gm of total fiber daily;Understanding of distribution of calorie intake throughout the day with the consumption of 4-5 meals/snacks    Heart Failure Yes  Intervention Provide a combined exercise and nutrition program that is supplemented with education, support and counseling about heart failure. Directed toward relieving symptoms such as shortness of breath, decreased exercise tolerance, and extremity edema.    Expected Outcomes Short term: Attendance in program 2-3 days a week with increased exercise capacity. Reported lower sodium intake. Reported increased fruit and vegetable intake. Reports medication compliance.;Short term: Daily weights obtained and reported for increase. Utilizing diuretic protocols set by physician.;Long term: Adoption of self-care skills and reduction of barriers for early signs and symptoms recognition and intervention leading to self-care maintenance.;Improve functional capacity of life    Hypertension Yes    Intervention Provide education on lifestyle modifcations including regular physical activity/exercise, weight management, moderate sodium restriction and increased consumption of fresh fruit, vegetables, and low fat dairy, alcohol moderation, and smoking cessation.;Monitor prescription use compliance.    Expected Outcomes Short Term: Continued assessment and intervention until BP is < 140/59mm HG in hypertensive participants. < 130/73mm HG in hypertensive participants with diabetes, heart failure or chronic kidney disease.;Long Term: Maintenance of blood pressure at goal levels.    Lipids Yes    Intervention Provide education and support for participant on nutrition & aerobic/resistive exercise along with prescribed medications to achieve LDL 70mg , HDL >40mg .    Expected Outcomes Short Term: Participant states understanding of desired cholesterol values and is compliant with medications prescribed. Participant is following  exercise prescription and nutrition guidelines.;Long Term: Cholesterol controlled with medications as prescribed, with individualized exercise RX and with personalized nutrition plan. Value goals: LDL < 70mg , HDL > 40 mg.    Stress Yes    Intervention Offer individual and/or small group education and counseling on adjustment to heart disease, stress management and health-related lifestyle change. Teach and support self-help strategies.;Refer participants experiencing significant psychosocial distress to appropriate mental health specialists for further evaluation and treatment. When possible, include family members and significant others in education/counseling sessions.    Expected Outcomes Short Term: Participant demonstrates changes in health-related behavior, relaxation and other stress management skills, ability to obtain effective social support, and compliance with psychotropic medications if prescribed.;Long Term: Emotional wellbeing is indicated by absence of clinically significant psychosocial distress or social isolation.             Core Components/Risk Factors/Patient Goals Review:   Goals and Risk Factor Review     Row Name 10/13/22 1525 10/16/22 1624 11/18/22 1144 12/11/22 1404 01/07/23 0851     Core Components/Risk Factors/Patient Goals Review   Personal Goals Review Weight Management/Obesity;Heart Failure;Hypertension;Lipids;Stress Weight Management/Obesity;Heart Failure;Hypertension;Lipids;Stress Weight Management/Obesity;Heart Failure;Hypertension;Lipids;Stress Weight Management/Obesity;Heart Failure;Hypertension;Lipids;Stress Weight Management/Obesity;Heart Failure;Hypertension;Lipids;Stress   Review Craig Flores started cardiac rehab on 10/13/22. Craig Flores did well with exercise. Vital signs were stable. Craig Flores started cardiac rehab on 10/13/22. Craig Flores is off to a good start to  exercise. Vital signs have been stable. Craig Flores is doing well with exercise at cardiac rehab  Vital signs have been stable.  Craig Flores has lost 1.4 kg since starting the program Craig Flores is doing well with exercise at cardiac rehab  Vital signs have been stable. Systolic BP's have been in the 90's. No furhter complaints of dizziness since episode on 11/26/22.  Craig Flores has lost 4.7  kg since starting the program and has been increasing his workloads. Craig Flores's last day of exercise was on 12/22/22. Patient has been absent.   Expected Outcomes Craig Flores will continue to participate in cardiac rehab for exercise, nutriiton and lifestyle modifications. Craig Flores will continue to participate in cardiac rehab for exercise, nutriiton  and lifestyle modifications. Craig Flores will continue to participate in cardiac rehab for exercise, nutriiton and lifestyle modifications. Craig Flores will continue to participate in cardiac rehab for exercise, nutriiton and lifestyle modifications. Craig Flores will continue to participate in cardiac rehab for exercise, nutriiton and lifestyle modifications.            Core Components/Risk Factors/Patient Goals at Discharge (Final Review):   Goals and Risk Factor Review - 01/07/23 0851       Core Components/Risk Factors/Patient Goals Review   Personal Goals Review Weight Management/Obesity;Heart Failure;Hypertension;Lipids;Stress    Review Craig Flores's last day of exercise was on 12/22/22. Patient has been absent.    Expected Outcomes Craig Flores will continue to participate in cardiac rehab for exercise, nutriiton and lifestyle modifications.             ITP Comments:  ITP Comments     Row Name 10/09/22 1357 10/13/22 1439 10/16/22 1619 11/18/22 1140 12/11/22 1349   ITP Comments Dr. Armanda Magic medical director. Introduction to [facility]ik[facility] education/ intensive cardiac rehab. Initial orientation packet reviewed with patient. 30 Day ITP comments. Craig Flores started cardiac rehab on 10/13/22. Craig Flores did well with exercise. 30 Day ITP comments. Craig Flores started cardiac rehab on 10/13/22. Craig Flores is off to a good start to exercise. 30 Day ITP comments. Craig Flores has good attendance  and participaition in  cardiac rehab 30 Day ITP comments. Craig Flores continues to have good attendance and participaition in  cardiac rehab    Row Name 01/07/23 0842           ITP Comments 30 Day ITP comments. Craig Flores's last day of attendance was on 12/22/22.                Comments: See ITP Comments

## 2023-01-07 NOTE — Progress Notes (Signed)
Remote ICD transmission.   

## 2023-01-12 ENCOUNTER — Other Ambulatory Visit: Payer: Self-pay

## 2023-01-13 ENCOUNTER — Telehealth (HOSPITAL_COMMUNITY): Payer: Self-pay | Admitting: *Deleted

## 2023-01-13 ENCOUNTER — Encounter (HOSPITAL_COMMUNITY): Payer: Self-pay | Admitting: *Deleted

## 2023-01-13 DIAGNOSIS — I429 Cardiomyopathy, unspecified: Secondary | ICD-10-CM

## 2023-01-13 NOTE — Telephone Encounter (Signed)
Craig Flores called back the reason he was not able to participate in cardiac rehab is because he was without a vehicle. Craig Flores is going to the gym now and has been doing some walking. Craig Flores says he appreciates the time he spent in cardiac rehab. Craig Flores says that he feels stronger.Thayer Headings RN BSN

## 2023-01-13 NOTE — Telephone Encounter (Signed)
Left message will discharge from cardiac rehab due to non attendance.Thayer Headings RN BSN

## 2023-01-13 NOTE — Progress Notes (Signed)
Discharge Progress Report  Patient Details  Name: Cleason Feick MRN: [id number] Date of Birth: October 03, 1965 Referring Provider:   Flowsheet Row CARDIAC REHAB PHASE II ORIENTATION from 10/09/2022 in [facility]  Referring Provider Dr. Marca Ancona, MD        Number of Visits: 29  Reason for Discharge:  Early Exit:  Lack of attendance  Smoking History:  Social History   Tobacco Use  Smoking Status Former   Current packs/day: 0.00   Types: Cigarettes   Quit date: 02/15/2022   Years since quitting: 0.9  Smokeless Tobacco Never  Tobacco Comments   x15 yrs as of 2021    Diagnosis:  Cardiomyopathy, unspecified type (HCC)  ADL [facility]:   Initial Exercise Prescription:   Discharge Exercise Prescription (Final Exercise Prescription Changes):  Exercise Prescription Changes - 12/22/22 1025       Response to Exercise   Blood Pressure (Admit) 96/68    Blood Pressure (Exit) 102/64    Heart Rate (Admit) 75 bpm    Heart Rate (Exercise) 120 bpm    Heart Rate (Exit) 75 bpm    Rating of Perceived Exertion (Exercise) 12    Symptoms None    Duration Continue with 30 min of aerobic exercise without signs/symptoms of physical distress.    Intensity THRR unchanged      Progression   Progression Continue to progress workloads to maintain intensity without signs/symptoms of physical distress.    Average METs 4.6      Resistance Training   Training Prescription Yes    Weight 3 lbs   cuff weights   Reps 10-15    Time 10 Minutes      Interval Training   Interval Training No      Treadmill   MPH 3.4    Grade 0    Minutes 15    METs 3.6      Bike   Level 10    Minutes 15    METs 5.6      Home Exercise Plan   Plans to continue exercise at Home (comment)   Walking   Frequency Add 3 additional days to program exercise sessions.    Initial Home Exercises Provided 10/24/22             Functional  Capacity:   Psychological, QOL, Others - Outcomes: PHQ 2/9:    12/17/2022    3:36 PM 11/14/2022   10:06 AM 10/09/2022    3:24 PM 08/15/2022   11:01 AM 06/06/2022   10:59 AM  Depression screen PHQ 2/9  Decreased Interest   1    Down, Depressed, Hopeless   1    PHQ - 2 Score   2    Altered sleeping   0    Tired, decreased energy   2    Change in appetite   0    Feeling bad or failure about yourself    1    Trouble concentrating   1    Moving slowly or fidgety/restless   1    Suicidal thoughts   0    PHQ-9 Score   7    Difficult doing work/chores   Somewhat difficult       Information is confidential and restricted. Go to Review Flowsheets to unlock data.    Quality of Life:   Personal Goals: Goals established at orientation with interventions provided to work toward goal.    Personal Goals  Discharge:  Goals and Risk Factor Review     Row Name 11/18/22 1144 12/11/22 1404 01/07/23 0851         Core Components/Risk Factors/Patient Goals Review   Personal Goals Review Weight Management/Obesity;Heart Failure;Hypertension;Lipids;Stress Weight Management/Obesity;Heart Failure;Hypertension;Lipids;Stress Weight Management/Obesity;Heart Failure;Hypertension;Lipids;Stress     Review Rocky Link is doing well with exercise at cardiac rehab  Vital signs have been stable. Rudell Cobb has lost 1.4 kg since starting the program Rocky Link is doing well with exercise at cardiac rehab  Vital signs have been stable. Systolic BP's have been in the 90's. No furhter complaints of dizziness since episode on 11/26/22.  Rudell Cobb has lost 4.7  kg since starting the program and has been increasing his workloads. Kent's last day of exercise was on 12/22/22. Patient has been absent.     Expected Outcomes Rocky Link will continue to participate in cardiac rehab for exercise, nutriiton and lifestyle modifications. Rocky Link will continue to participate in cardiac rehab for exercise, nutriiton and lifestyle modifications. Rocky Link will continue to  participate in cardiac rehab for exercise, nutriiton and lifestyle modifications.              Exercise Goals and Review:   Exercise Goals Re-Evaluation:  Exercise Goals Re-Evaluation     Row Name 11/17/22 1111 12/15/22 1052           Exercise Goal Re-Evaluation   Exercise Goals Review Increase Physical Activity;Increase Strength and Stamina;Able to understand and use rate of perceived exertion (RPE) scale;Knowledge and understanding of Target Heart Rate Range (THRR);Understanding of Exercise Prescription Increase Physical Activity;Increase Strength and Stamina;Able to understand and use rate of perceived exertion (RPE) scale;Knowledge and understanding of Target Heart Rate Range (THRR);Understanding of Exercise Prescription      Comments Rudell Cobb states he's feeling better but still working on losing weight. His goal is 185 lbs. He is doing well with exercise at cardiac rehab but starting to exceed THRR. Will request THRR increase. Rudell Cobb has started working out at his gym 2 days/week. He is walking th etreadmill 30-35 minutes and using the machine weights.      Expected Outcomes Progress workloads as tolerated. Continue to progress workloads as tolerated.               Nutrition & Weight - Outcomes:    Nutrition:  Nutrition Therapy & Goals - 12/11/22 1310       Nutrition Therapy   Diet Heart Healthy Diet    Drug/Food Interactions Statins/Certain Fruits      Personal Nutrition Goals   Nutrition Goal Patient to identify strategies for reducing cardiovascular risk by attending the [employer] education and nutrition series weekly.   goal in action.   Personal Goal #2 Patient to improve diet quality by using the plate method as a guide for meal planning to include lean protein/plant protein, fruits, vegetables, whole grains, nonfat dairy as part of a well-balanced diet.   goal in action.   Personal Goal #3 Patient to reduce sodium to 1500mg  per day   goal in action.   Comments  Goals in progress. Rudell Cobb continues to attend the [employer] and nutrition series regularly. He has started making many dietary changes including increased low calorie dense foods and increased high fiber foods. Per documentation, he has stopped alcohol ~4 months ago; he continues regular follow-up with behavioal health. His triglycerides have improved WNL.  He continues regular follow-up with anti-coagulation clinic. He is down 10.3## since starting with our program; his goal weight is ~185#.  Patient will  benefit from participation in intensive cardiac rehab for nutrition, exercise, and lifestyle modification.      Intervention Plan   Intervention Prescribe, educate and counsel regarding individualized specific dietary modifications aiming towards targeted core components such as weight, hypertension, lipid management, diabetes, heart failure and other comorbidities.;Nutrition handout(s) given to patient.    Expected Outcomes Short Term Goal: Understand basic principles of dietary content, such as calories, fat, sodium, cholesterol and nutrients.;Long Term Goal: Adherence to prescribed nutrition plan.             Nutrition Discharge:   Education Questionnaire Score:   Goals reviewed with patient; copy given to patient. Kent participated in 27  exercise and 27 educations classes at cardiac rehab from 10/09/22-12/22/22. Kent increased his work loads and did well with exercise while enrolled in the program. Rudell Cobb has been discharged from cardiac rehab due to non attendance. Kent lost 3.9 kg while enrolled in cardiac rehab. We are proud of Kent's progress. Thayer Headings RN BSN

## 2023-01-19 ENCOUNTER — Other Ambulatory Visit: Payer: Self-pay

## 2023-01-26 ENCOUNTER — Other Ambulatory Visit: Payer: Self-pay

## 2023-01-27 ENCOUNTER — Other Ambulatory Visit: Payer: Self-pay

## 2023-02-02 ENCOUNTER — Other Ambulatory Visit: Payer: Self-pay

## 2023-02-05 ENCOUNTER — Telehealth: Payer: Self-pay | Admitting: *Deleted

## 2023-02-05 NOTE — Telephone Encounter (Signed)
 Called pt since received a call that he needs to schedule an apppt. There was no answer and no voicemail so unable to leave a message.

## 2023-02-09 ENCOUNTER — Other Ambulatory Visit: Payer: Self-pay

## 2023-02-10 ENCOUNTER — Other Ambulatory Visit: Payer: Self-pay

## 2023-02-13 ENCOUNTER — Ambulatory Visit: Payer: Medicaid Other | Attending: Cardiology

## 2023-02-13 ENCOUNTER — Other Ambulatory Visit (HOSPITAL_COMMUNITY): Payer: Self-pay | Admitting: Cardiology

## 2023-02-13 ENCOUNTER — Other Ambulatory Visit: Payer: Self-pay

## 2023-02-13 DIAGNOSIS — Z5181 Encounter for therapeutic drug level monitoring: Secondary | ICD-10-CM

## 2023-02-13 DIAGNOSIS — Z7901 Long term (current) use of anticoagulants: Secondary | ICD-10-CM

## 2023-02-13 DIAGNOSIS — I513 Intracardiac thrombosis, not elsewhere classified: Secondary | ICD-10-CM

## 2023-02-13 LAB — POCT INR: INR: 3.4 — AB (ref 2.0–3.0)

## 2023-02-13 MED ORDER — FENOFIBRATE 160 MG PO TABS
160.0000 mg | ORAL_TABLET | Freq: Every day | ORAL | 3 refills | Status: DC
  Filled 2023-02-13: qty 30, 30d supply, fill #0
  Filled 2023-03-21: qty 30, 30d supply, fill #1
  Filled 2023-04-20: qty 30, 30d supply, fill #2
  Filled 2023-05-18: qty 30, 30d supply, fill #3

## 2023-02-13 NOTE — Patient Instructions (Signed)
Continue taking 2 tablets daily except 1.5 tablets on Thursdays and Saturdays.   Stay consistent with greens and protein drink each week  Recheck INR 6 weeks (normally 6 weeks).  [facility] [phone] BILL [id number]

## 2023-02-22 ENCOUNTER — Other Ambulatory Visit (HOSPITAL_COMMUNITY): Payer: Self-pay | Admitting: Cardiology

## 2023-02-22 DIAGNOSIS — I513 Intracardiac thrombosis, not elsewhere classified: Secondary | ICD-10-CM

## 2023-02-23 ENCOUNTER — Other Ambulatory Visit: Payer: Self-pay

## 2023-02-23 MED ORDER — WARFARIN SODIUM 5 MG PO TABS
ORAL_TABLET | ORAL | 5 refills | Status: DC
  Filled 2023-02-23: qty 60, 30d supply, fill #0
  Filled 2023-03-21: qty 60, 30d supply, fill #1
  Filled 2023-04-25: qty 60, 30d supply, fill #2
  Filled 2023-05-23: qty 60, 30d supply, fill #3
  Filled 2023-06-28: qty 60, 30d supply, fill #4
  Filled 2023-07-31: qty 60, 30d supply, fill #5

## 2023-02-23 NOTE — Telephone Encounter (Signed)
Refill request for warfarin:  Last INR was 3.4 on 02/13/23 Next INR due 03/27/23 LOV was 10/28/22  Refill approved.

## 2023-02-24 ENCOUNTER — Other Ambulatory Visit: Payer: Self-pay

## 2023-02-26 ENCOUNTER — Encounter (HOSPITAL_COMMUNITY): Payer: Self-pay | Admitting: Psychiatry

## 2023-02-26 ENCOUNTER — Other Ambulatory Visit: Payer: Self-pay

## 2023-02-26 ENCOUNTER — Telehealth (HOSPITAL_COMMUNITY): Payer: Medicaid Other | Admitting: Psychiatry

## 2023-02-26 DIAGNOSIS — F32A Depression, unspecified: Secondary | ICD-10-CM | POA: Diagnosis not present

## 2023-02-26 DIAGNOSIS — F411 Generalized anxiety disorder: Secondary | ICD-10-CM

## 2023-02-26 MED ORDER — MIRTAZAPINE 45 MG PO TABS
45.0000 mg | ORAL_TABLET | Freq: Every day | ORAL | 3 refills | Status: DC
  Filled 2023-02-26 – 2023-03-01 (×2): qty 30, 30d supply, fill #0
  Filled 2023-03-29: qty 30, 30d supply, fill #1
  Filled 2023-05-02: qty 30, 30d supply, fill #2

## 2023-02-26 MED ORDER — HYDROXYZINE HCL 25 MG PO TABS
25.0000 mg | ORAL_TABLET | Freq: Three times a day (TID) | ORAL | 3 refills | Status: DC | PRN
  Filled 2023-02-26: qty 90, 30d supply, fill #0
  Filled 2023-05-02: qty 90, 30d supply, fill #1

## 2023-02-26 NOTE — Progress Notes (Signed)
BH MD/PA/NP OP Progress Note Virtual Visit via Video Note  I connected with Craig Flores on 02/26/23 at  3:00 PM EST by a video enabled telemedicine application and verified that I am speaking with the correct person using two identifiers.  Location: Patient: home Provider: Clinic   I discussed the limitations of evaluation and management by telemedicine and the availability of in person appointments. The patient expressed understanding and agreed to proceed.  I provided 30 minutes of non-face-to-face time during this encounter.       02/26/2023 3:03 PM Craig Flores  MRN:  [id number]  Chief Complaint: "I have been exercising"  HPI:  58 year old male seen today for follow up psychiatric evaluation. He has a psychiatric history of alcohol dependence, substance induced mood disorder, anxiety, depression, and SI. He is currently being managed on  hydroxyzine 25 mg three times daily as needed, and Mirtazapine 45 mg at nightly.  Patient also takes melatonin 5 mg provides it over-the-counter.  He notes that his medications are effective in managing his psychiatric conditions.   Today he well pleasant, cooperative, engaged in conversation.  He informed Clinical research associate that recently he has been exercising more.  He notes that since his last visit he has lost 12 to 15 pounds.  Patient notes that he is trying to better his cardiac health.  At times he reports that he is fatigued but notes that he believes he, from his cardiac issues.    Since his last visit he notes that his anxiety and depression continues to be well-managed.   Today  provider conducted a GAD-7 and he is 10, at his last visit he scored a 6.  Provider also conducted a PHQ-9 and patient scored a 9, at her last visit he scored a 5.  He endorses adequate sleep and appetite.  Today he denies SI/HI/VAH, mania, paranoia.    No medication changes made today. Patient agreeable to continue medications as prescribed.  No other concerns noted  at this time.   Visit Diagnosis:  No diagnosis found.      Past Psychiatric History: alcohol dependence, substance induced mood disorder, anxiety, depression, and SI.   Past Medical History:  Past Medical History:  Diagnosis Date   Acid reflux    AICD (automatic cardioverter/defibrillator) present    Anxiety    Aortic stenosis with bicuspid valve    CHF (congestive heart failure) (HCC)    Depression    Dyspnea    GERD (gastroesophageal reflux disease)    HLD (hyperlipidemia)    Ischemic cardiomyopathy    Migraines    Myocardial infarction (HCC) 08/28/2019    Past Surgical History:  Procedure Laterality Date   AORTIC VALVE REPLACEMENT N/A 02/19/2022   Procedure: AORTIC VALVE REPLACEMENT (AVR) 23mm SJM REGENT MECHANICAL AORTIC VALVE;  Surgeon: Eugenio Hoes, MD;  Location: [facility] OR;  Service: Open Heart Surgery;  Laterality: N/A;   COLONOSCOPY WITH PROPOFOL N/A 07/16/2021   Procedure: COLONOSCOPY WITH PROPOFOL;  Surgeon: Iva Boop, MD;  Location: [facility] END[facility]COPY;  Service: Gastroenterology;  Laterality: N/A;   CORONARY/GRAFT ACUTE MI REVASCULARIZATION N/A 08/28/2019   Procedure: Coronary/Graft Acute MI Revascularization;  Surgeon: Runell Gess, MD;  Location: [facility] INVASIVE CV LAB;  Service: Cardiovascular;  Laterality: N/A;   ICD IMPLANT N/A 11/28/2019   Procedure: ICD IMPLANT;  Surgeon: Duke Salvia, MD;  Location: [facility] INVASIVE CV LAB;  Service: Cardiovascular;  Laterality: N/A;   LEFT HEART CATH AND CORONARY ANGIOGRAPHY N/A 08/28/2019   Procedure:  LEFT HEART CATH AND CORONARY ANGIOGRAPHY;  Surgeon: Runell Gess, MD;  [facility] INVASIVE CV LAB;  Service: Cardiovascular;  Laterality: N/A;   POLYPECTOMY  07/16/2021   Procedure: POLYPECTOMY;  Surgeon: Iva Boop, MD;  Location: [facility] ENDOSCOPY;  Service: Gastroenterology;;   RIGHT HEART CATH AND CORONARY ANGIOGRAPHY N/A 12/12/2021   Procedure: RIGHT HEART CATH AND CORONARY ANGIOGRAPHY;  Surgeon: Kathleene Hazel, MD;  Location: [facility] INVASIVE CV LAB;  Service: Cardiovascular;  Laterality: N/A;   TEE WITHOUT CARDIOVERSION N/A 02/19/2022   Procedure: TRANSESOPHAGEAL ECHOCARDIOGRAM (TEE);  Surgeon: Eugenio Hoes, MD;  Location: [facility] OR;  Service: Open Heart Surgery;  Laterality: N/A;   WISDOM TOOTH EXTRACTION      Family Psychiatric History: Notes that his mother and aunt had mental health issues. Notes he belives they had anxiety  Family History:  Family History  Problem Relation Age of Onset   Cancer Mother        peritoneal   Arrhythmia Father    Prostate cancer Father    Colon cancer Brother 38   Cancer Maternal Grandmother        type unknown   Heart attack Maternal Grandfather    Diabetes Paternal Grandmother    Heart attack Paternal Grandfather     Social History:  Social History   Socioeconomic History   Marital status: Married    Spouse name: Not on file   Number of children: 0   Years of education: Not on file   Highest education level: Not on file  Occupational History   Occupation: Clinical research associate   Occupation: Attorney/Writer  Tobacco Use   Smoking status: Former    Current packs/day: 0.00    Types: Cigarettes    Quit date: 02/15/2022    Years since quitting: 1.0   Smokeless tobacco: Never   Tobacco comments:    x15 yrs as of 2021  Vaping Use   Vaping status: Never Used  Substance and Sexual Activity   Alcohol use: Not Currently    Comment: a couple of drinks on the weekends   Drug use: Never   Sexual activity: Yes    Birth control/protection: None  Other Topics Concern   Not on file  Social History Narrative   2 adopted children   Social Drivers of Health   Financial Resource Strain: Not on file  Food Insecurity: No Food Insecurity (02/20/2022)   Hunger Vital Sign    Worried About Running Out of Food in the Last Year: Never true    Ran Out of Food in the Last Year: Never true  Transportation Needs: No Transportation Needs (02/20/2022)   PRAPARE -  Administrator, Civil Service (Medical): No    Lack of Transportation (Non-Medical): No  Physical Activity: Not on file  Stress: Not on file  Social Connections: Not on file    Allergies: No Known Allergies  Metabolic Disorder Labs: Lab Results  Component Value Date   HGBA1C 5.8 (H) 02/17/2022   MPG 119.76 02/17/2022   MPG 119.76 08/28/2019   No results found for: "PROLACTIN" Lab Results  Component Value Date   CHOL 102 11/18/2022   TRIG 103 11/18/2022   HDL 37 (L) 11/18/2022   CHOLHDL 2.8 11/18/2022   VLDL 21 11/18/2022   LDLCALC 44 11/18/2022   LDLCALC 46 09/16/2022   Lab Results  Component Value Date   TSH 3.181 08/28/2019    Therapeutic Level Labs: No results found for: "LITHIUM" No results found for: "  VALPROATE" No results found for: "CBMZ"  Current Medications: Current Outpatient Medications  Medication Sig Dispense Refill   acetaminophen (TYLENOL) 500 MG tablet Take 500 mg by mouth every 6 (six) hours as needed for headache.     aspirin 81 MG chewable tablet Chew 1 tablet (81 mg total) by mouth daily. 90 tablet 3   atorvastatin (LIPITOR) 80 MG tablet Take 1 tablet (80 mg total) by mouth daily. 30 tablet 5   carvedilol (COREG) 6.25 MG tablet Take 1 tablet (6.25 mg total) by mouth 2 (two) times daily. 60 tablet 6   dapagliflozin propanediol (FARXIGA) 10 MG TABS tablet Take 1 tablet (10 mg total) by mouth daily before breakfast. 30 tablet 11   digoxin (LANOXIN) 0.125 MG tablet Take 1/2 tablet (0.0625 mg total) by mouth daily. 30 tablet 6   ezetimibe (ZETIA) 10 MG tablet Take 1 tablet (10 mg total) by mouth daily. 90 tablet 3   fenofibrate 160 MG tablet Take 1 tablet (160 mg total) by mouth daily. 30 tablet 3   furosemide (LASIX) 20 MG tablet Take 1 tablet (20 mg total) by mouth daily. 30 tablet 6   hydrOXYzine (ATARAX) 25 MG tablet Take 1 tablet (25 mg total) by mouth 3 (three) times daily as needed. 90 tablet 3   melatonin 5 MG TABS Take 2 tablets  (10 mg total) by mouth at bedtime. 30 tablet 3   mirtazapine (REMERON) 45 MG tablet Take 1 tablet (45 mg total) by mouth at bedtime. 30 tablet 3   Multiple Vitamin (MULTIVITAMIN WITH MINERALS) TABS tablet Take 1 tablet by mouth daily.     omega-3 acid ethyl esters (LOVAZA) 1 g capsule TAKE 2 CAPSULES (2 GRAMS TOTAL) BY MOUTH TWO TIMES DAILY. 120 capsule 3   omeprazole (PRILOSEC) 20 MG capsule Take 20 mg by mouth daily before breakfast.     Probiotic Product (PROBIOTIC DAILY PO) Take 1 capsule by mouth with breakfast, with lunch, and with evening meal.     sacubitril-valsartan (ENTRESTO) 97-103 MG Take 1 tablet by mouth 2 (two) times daily. 60 tablet 11   SILDENAFIL CITRATE PO Take 45-90 mg by mouth daily as needed (ED). Take 1 - 2 tablets 30 to 45 min before sexual activity as needed     spironolactone (ALDACTONE) 50 MG tablet Take 1 tablet (50 mg total) by mouth daily. 30 tablet 6   warfarin (COUMADIN) 5 MG tablet Take 1.5 tablets to 2 tablets by mouth once daily as directed by Anticoagulation clinic 60 tablet 5   No current facility-administered medications for this visit.     Musculoskeletal: Strength & Muscle Tone: Normal, telehealth visit Gait & Station: normal, telehealth visit Patient leans: N/A  Psychiatric Specialty Exam: Review of Systems  There were no vitals taken for this visit.There is no height or weight on file to calculate BMI.  General Appearance: Well Groomed  Eye Contact:  Good  Speech:  Clear and Coherent and Normal Rate  Volume:  Normal  Mood:  Euthymic  Affect:  Appropriate and Congruent  Thought Process:  Coherent, Goal Directed and Linear  Orientation:  Full (Time, Place, and Person)  Thought Content: WDL and Logical   Suicidal Thoughts:  No  Homicidal Thoughts:  No  Memory:  Immediate;   Good Recent;   Good Remote;   Good  Judgement:  Good  Insight:  Good  Psychomotor Activity:  Normal  Concentration:  Concentration: Good and Attention Span: Good   Recall:  Good  Fund  of Knowledge: Good  Language: Good  Akathisia:  No  Handed:  Right  AIMS (if indicated): Not done  Assets:  Communication Skills Desire for Improvement Financial Resources/Insurance Housing Intimacy Social Support  ADL's:  Intact  Cognition: WNL  Sleep:  Good   Screenings: AIMS    Flowsheet Row [facility]  AIMS Total Score 0      AUDIT    Flowsheet Row [facility]  Alcohol Use Disorder Identification Test Final Score (AUDIT) 19      GAD-7    Flowsheet Row Video Visit from 12/17/2022 in [facility] Video Visit from 11/14/2022 in [facility] Video Visit from 08/15/2022 in [facility] Video Visit from 06/06/2022 in [facility] Video Visit from 03/26/2022 in [facility]  Total GAD-7 Score 6 10 9 15 9       [id number]    Flowsheet Row Video Visit from 12/17/2022 in [facility] Video Visit from 11/14/2022 in [facility] CARDIAC REHAB PHASE II ORIENTATION from 10/09/2022 in [facility] Video Visit from 08/15/2022 in [facility] Video Visit from 06/06/2022 in [facility]  PHQ-2 Total Score 1 3 2 2 4   PHQ-9 Total Score 5 9 7 7 19       Flowsheet Row Video Visit from 06/06/2022 in [facility] Admission (Discharged) from 02/19/2022 in [facility] 4E CV SURGICAL PROGRESSIVE CARE Pre-Admission Testing 60 from 02/17/2022 in [facility] PREADMISSION TESTING  C-SSRS RISK CATEGORY Low Risk No Risk No Risk        Assessment and Plan: Patient notes that his medication are  effective.No medication changes made today. Patient agreeable to continue medications as prescribed.   1. Generalized anxiety disorder  Continue- mirtazapine (REMERON) 45 MG tablet; Take 1 tablet (45 mg total) by mouth at bedtime.  Dispense: 30 tablet; Refill: 3 Continue- hydrOXYzine (ATARAX) 25 MG tablet; Take 1 tablet (25 mg total) by mouth 3 (three) times daily as needed.  Dispense: 90 tablet; Refill: 3  2. Mild depression  Continue- mirtazapine (REMERON) 45 MG tablet; Take 1 tablet (45 mg total) by mouth at bedtime.  Dispense: 30 tablet; Refill: 3  Follow up in 2.5 months Follow up with therapy  Shanna Cisco, NP 02/26/2023, 3:03 PM

## 2023-03-02 ENCOUNTER — Other Ambulatory Visit: Payer: Self-pay

## 2023-03-09 ENCOUNTER — Other Ambulatory Visit: Payer: Self-pay

## 2023-03-15 ENCOUNTER — Other Ambulatory Visit: Payer: Self-pay | Admitting: Cardiology

## 2023-03-16 ENCOUNTER — Other Ambulatory Visit: Payer: Self-pay

## 2023-03-16 MED ORDER — OMEGA-3-ACID ETHYL ESTERS 1 G PO CAPS
2.0000 g | ORAL_CAPSULE | Freq: Two times a day (BID) | ORAL | 1 refills | Status: DC
  Filled 2023-03-16: qty 120, 30d supply, fill #0
  Filled 2023-04-12: qty 120, 30d supply, fill #1

## 2023-03-23 ENCOUNTER — Other Ambulatory Visit: Payer: Self-pay

## 2023-03-26 ENCOUNTER — Ambulatory Visit (INDEPENDENT_AMBULATORY_CARE_PROVIDER_SITE_OTHER)

## 2023-03-26 DIAGNOSIS — I255 Ischemic cardiomyopathy: Secondary | ICD-10-CM | POA: Diagnosis not present

## 2023-03-27 ENCOUNTER — Ambulatory Visit: Payer: Medicaid Other

## 2023-03-30 ENCOUNTER — Other Ambulatory Visit: Payer: Self-pay

## 2023-04-01 ENCOUNTER — Ambulatory Visit: Payer: Medicaid Other | Attending: Cardiology

## 2023-04-01 DIAGNOSIS — I513 Intracardiac thrombosis, not elsewhere classified: Secondary | ICD-10-CM

## 2023-04-01 LAB — POCT INR: INR: 2.9 (ref 2.0–3.0)

## 2023-04-01 NOTE — Patient Instructions (Signed)
 Description   Take 2.5 tablets today and then continue taking 2 tablets daily except 1.5 tablets on Thursdays and Saturdays.   Stay consistent with greens and protein drink each week  Recheck INR 5 weeks.  Coumadin Clinic [phone] BILL [id number]

## 2023-04-02 LAB — CUP PACEART REMOTE DEVICE CHECK
Battery Remaining Longevity: 162 mo
Battery Remaining Percentage: 100 %
Brady Statistic RV Percent Paced: 0 %
Date Time Interrogation Session: 20250227014000
HighPow Impedance: 81 Ohm
Implantable Lead Connection Status: 753985
Implantable Lead Implant Date: 20211101
Implantable Lead Location: 753860
Implantable Lead Model: 138
Implantable Lead Serial Number: 303500
Implantable Pulse Generator Implant Date: 20211101
Lead Channel Impedance Value: 510 Ohm
Lead Channel Setting Pacing Amplitude: 2.5 V
Lead Channel Setting Pacing Pulse Width: 0.4 ms
Lead Channel Setting Sensing Sensitivity: 0.5 mV
Pulse Gen Serial Number: 209902

## 2023-04-04 ENCOUNTER — Other Ambulatory Visit (HOSPITAL_COMMUNITY): Payer: Self-pay | Admitting: Cardiology

## 2023-04-05 ENCOUNTER — Other Ambulatory Visit: Payer: Self-pay

## 2023-04-07 ENCOUNTER — Other Ambulatory Visit: Payer: Self-pay

## 2023-04-07 MED ORDER — DAPAGLIFLOZIN PROPANEDIOL 10 MG PO TABS
10.0000 mg | ORAL_TABLET | Freq: Every day | ORAL | 11 refills | Status: AC
  Filled 2023-04-07: qty 30, 30d supply, fill #0
  Filled 2023-05-02: qty 30, 30d supply, fill #1
  Filled 2023-06-06: qty 30, 30d supply, fill #2
  Filled 2023-07-03: qty 30, 30d supply, fill #3
  Filled 2023-07-31 – 2023-08-03 (×2): qty 30, 30d supply, fill #4
  Filled 2023-08-29: qty 30, 30d supply, fill #5
  Filled 2023-10-04: qty 30, 30d supply, fill #6
  Filled 2023-10-31: qty 30, 30d supply, fill #7
  Filled 2023-11-28: qty 30, 30d supply, fill #8
  Filled 2024-01-03: qty 30, 30d supply, fill #9
  Filled 2024-01-30: qty 30, 30d supply, fill #10
  Filled 2024-02-27: qty 30, 30d supply, fill #11

## 2023-04-08 ENCOUNTER — Other Ambulatory Visit (HOSPITAL_BASED_OUTPATIENT_CLINIC_OR_DEPARTMENT_OTHER): Payer: Self-pay

## 2023-04-08 ENCOUNTER — Other Ambulatory Visit: Payer: Self-pay

## 2023-04-13 ENCOUNTER — Other Ambulatory Visit: Payer: Self-pay

## 2023-04-14 ENCOUNTER — Encounter: Payer: Self-pay | Admitting: Internal Medicine

## 2023-04-14 ENCOUNTER — Other Ambulatory Visit: Payer: Self-pay

## 2023-04-20 ENCOUNTER — Other Ambulatory Visit: Payer: Self-pay

## 2023-04-23 ENCOUNTER — Telehealth (HOSPITAL_COMMUNITY): Payer: Self-pay | Admitting: Cardiology

## 2023-04-23 ENCOUNTER — Other Ambulatory Visit (HOSPITAL_COMMUNITY): Payer: Self-pay | Admitting: Cardiology

## 2023-04-24 ENCOUNTER — Other Ambulatory Visit: Payer: Self-pay

## 2023-04-24 MED ORDER — ENTRESTO 97-103 MG PO TABS
1.0000 | ORAL_TABLET | Freq: Two times a day (BID) | ORAL | 11 refills | Status: AC
  Filled 2023-04-24: qty 60, 30d supply, fill #0
  Filled 2023-05-23: qty 60, 30d supply, fill #1
  Filled 2023-06-28: qty 60, 30d supply, fill #2
  Filled 2023-07-25: qty 60, 30d supply, fill #3
  Filled 2023-08-23: qty 60, 30d supply, fill #4
  Filled 2023-09-19: qty 60, 30d supply, fill #5
  Filled 2023-10-25: qty 60, 30d supply, fill #6
  Filled 2023-11-21: qty 60, 30d supply, fill #7
  Filled 2023-12-19: qty 60, 30d supply, fill #8
  Filled 2024-01-24: qty 60, 30d supply, fill #9
  Filled 2024-02-18: qty 60, 30d supply, fill #10

## 2023-04-24 MED ORDER — SPIRONOLACTONE 50 MG PO TABS
50.0000 mg | ORAL_TABLET | Freq: Every day | ORAL | 6 refills | Status: DC
  Filled 2023-04-24: qty 30, 30d supply, fill #0
  Filled 2023-05-23: qty 30, 30d supply, fill #1
  Filled 2023-06-28: qty 30, 30d supply, fill #2
  Filled 2023-07-25: qty 30, 30d supply, fill #3
  Filled 2023-08-23: qty 30, 30d supply, fill #4
  Filled 2023-09-19: qty 30, 30d supply, fill #5
  Filled 2023-10-25: qty 30, 30d supply, fill #6

## 2023-04-27 ENCOUNTER — Other Ambulatory Visit: Payer: Self-pay

## 2023-04-28 NOTE — Addendum Note (Signed)
 Addended by: Elease Etienne A on: 04/28/2023 10:12 AM   Modules accepted: Orders

## 2023-04-28 NOTE — Progress Notes (Signed)
 Remote ICD transmission.

## 2023-05-04 ENCOUNTER — Other Ambulatory Visit: Payer: Self-pay

## 2023-05-06 ENCOUNTER — Telehealth: Payer: Self-pay

## 2023-05-06 ENCOUNTER — Ambulatory Visit: Attending: Cardiology

## 2023-05-06 NOTE — Telephone Encounter (Signed)
 Pt missed coumadin clinic appt. Called pt, no answer. Left message on voicemail.

## 2023-05-08 ENCOUNTER — Other Ambulatory Visit: Payer: Self-pay | Admitting: Cardiology

## 2023-05-12 ENCOUNTER — Other Ambulatory Visit: Payer: Self-pay

## 2023-05-12 MED ORDER — OMEGA-3-ACID ETHYL ESTERS 1 G PO CAPS
2.0000 g | ORAL_CAPSULE | Freq: Two times a day (BID) | ORAL | 1 refills | Status: DC
  Filled 2023-05-12 (×2): qty 120, 30d supply, fill #0
  Filled 2023-06-06: qty 120, 30d supply, fill #1

## 2023-05-13 ENCOUNTER — Other Ambulatory Visit: Payer: Self-pay

## 2023-05-16 ENCOUNTER — Other Ambulatory Visit (HOSPITAL_COMMUNITY): Payer: Self-pay | Admitting: Cardiology

## 2023-05-17 ENCOUNTER — Other Ambulatory Visit: Payer: Self-pay

## 2023-05-18 ENCOUNTER — Other Ambulatory Visit: Payer: Self-pay

## 2023-05-19 ENCOUNTER — Other Ambulatory Visit: Payer: Self-pay

## 2023-05-19 MED ORDER — DIGOXIN 125 MCG PO TABS
0.0625 mg | ORAL_TABLET | Freq: Every day | ORAL | 3 refills | Status: AC
  Filled 2023-05-19: qty 45, 90d supply, fill #0
  Filled 2023-08-15: qty 45, 90d supply, fill #1
  Filled 2023-10-31: qty 45, 90d supply, fill #2
  Filled 2024-01-30: qty 45, 90d supply, fill #3

## 2023-05-20 ENCOUNTER — Encounter (HOSPITAL_COMMUNITY): Payer: Self-pay | Admitting: Psychiatry

## 2023-05-20 ENCOUNTER — Telehealth (HOSPITAL_COMMUNITY): Payer: Self-pay | Admitting: *Deleted

## 2023-05-20 ENCOUNTER — Other Ambulatory Visit: Payer: Self-pay

## 2023-05-20 ENCOUNTER — Telehealth (INDEPENDENT_AMBULATORY_CARE_PROVIDER_SITE_OTHER): Payer: Medicaid Other | Admitting: Psychiatry

## 2023-05-20 DIAGNOSIS — F411 Generalized anxiety disorder: Secondary | ICD-10-CM | POA: Diagnosis not present

## 2023-05-20 DIAGNOSIS — F32A Depression, unspecified: Secondary | ICD-10-CM

## 2023-05-20 MED ORDER — REXULTI 0.5 MG PO TABS
0.5000 mg | ORAL_TABLET | Freq: Every day | ORAL | 3 refills | Status: DC
  Filled 2023-05-20: qty 60, 30d supply, fill #0
  Filled 2023-06-15: qty 60, 30d supply, fill #1

## 2023-05-20 MED ORDER — HYDROXYZINE HCL 25 MG PO TABS
25.0000 mg | ORAL_TABLET | Freq: Three times a day (TID) | ORAL | 3 refills | Status: DC | PRN
  Filled 2023-05-20 – 2023-06-01 (×2): qty 90, 30d supply, fill #0
  Filled 2023-07-06: qty 90, 30d supply, fill #1
  Filled 2023-07-31: qty 90, 30d supply, fill #2
  Filled 2023-09-19: qty 90, 30d supply, fill #3

## 2023-05-20 MED ORDER — MELATONIN 5 MG PO TABS
10.0000 mg | ORAL_TABLET | Freq: Every day | ORAL | 3 refills | Status: DC
  Filled 2023-05-20: qty 30, 15d supply, fill #0

## 2023-05-20 MED ORDER — MIRTAZAPINE 45 MG PO TABS
45.0000 mg | ORAL_TABLET | Freq: Every day | ORAL | 3 refills | Status: DC
  Filled 2023-05-20 – 2023-06-01 (×2): qty 30, 30d supply, fill #0
  Filled 2023-06-28: qty 30, 30d supply, fill #1
  Filled 2023-07-31: qty 30, 30d supply, fill #2
  Filled 2023-08-29: qty 30, 30d supply, fill #3

## 2023-05-20 NOTE — Telephone Encounter (Signed)
 Fax received for PA of Rexulti  0.5mg . Submitted online with cover my meds. Approved until 05/19/24. Called to notify pharmacy.

## 2023-05-20 NOTE — Progress Notes (Signed)
 [facility] MD/PA/NP OP Progress Note Virtual Visit via Video Note  I connected with Craig Flores on 05/20/23 at  1:30 PM EDT by a video enabled telemedicine application and verified that I am speaking with the correct person using two identifiers.  Location: Patient: home Provider: Clinic   I discussed the limitations of evaluation and management by telemedicine and the availability of in person appointments. The patient expressed understanding and agreed to proceed.  I provided 30 minutes of non-face-to-face time during this encounter.       05/20/2023 1:49 PM [name]  MRN:  [id number]  Chief Complaint: "I have been struggling and ruminating about the past"  HPI:  58 year old male seen today for follow up psychiatric evaluation. He has a psychiatric history of alcohol dependence, substance induced mood disorder, anxiety, depression, and SI. He is currently being managed on  hydroxyzine  25 mg three times daily as needed, and Mirtazapine  45 mg at nightly.  Patient also takes melatonin 5 mg provides it over-the-counter.  He notes that his medications are somewhat effective in managing his psychiatric conditions.   Today he well pleasant, cooperative, engaged in conversation.  He informed Clinical research associate that he has been struggling a ruminating about the past.  Patient informed Clinical research associate that he believes that he needs a medication adjustment as he is fearful that he will fall back into alcohol dependence.  Patient notes that 3 weeks ago he relapsed and drank a liter of alcohol.  Provider conducted an audit assessment and patient scored a 10.  Patient reports that recently his friends and family has been telling him he has been more irritable and restless.  Patient does describe being more anxious.  He notes that he worries about finances.  He also informed Clinical research associate that he is more depressed.   Today  provider conducted a GAD-7 and he is 13, at his last visit he scored a 10.  Provider also conducted a  PHQ-9 and patient scored a 16, at her last visit he scored a 9.  He endorses adequate sleep and appetite.  Today he denies SI/HI/VAH, mania, paranoia.    Patient reports that his cardiac health is well-managed.    Today patient agreeable to starting Rexulti  0.5 mg for a week.  He will increase to 1 mg a week two to help manage mood and depressive state.  He will continue all of his other medications as prescribed.  Potential side effects of medication and risks vs benefits of treatment vs non-treatment were explained and discussed. All questions were answered.  No other concerns noted at this time.   Visit Diagnosis:    ICD-10-CM   1. Generalized anxiety disorder  F41.1 hydrOXYzine  (ATARAX ) 25 MG tablet    mirtazapine  (REMERON ) 45 MG tablet    2. Mild depression  F32.A Brexpiprazole  (REXULTI ) 0.5 MG TABS    mirtazapine  (REMERON ) 45 MG tablet    melatonin 5 MG TABS          Past Psychiatric History: alcohol dependence, substance induced mood disorder, anxiety, depression, and SI.   Past Medical History:  Past Medical History:  Diagnosis Date   Acid reflux    AICD (automatic cardioverter/defibrillator) present    Anxiety    Aortic stenosis with bicuspid valve    CHF (congestive heart failure) (HCC)    Depression    Dyspnea    GERD (gastroesophageal reflux disease)    HLD (hyperlipidemia)    Ischemic cardiomyopathy    Migraines    Myocardial  infarction (HCC) 08/28/2019    Past Surgical History:  Procedure Laterality Date   AORTIC VALVE REPLACEMENT N/A 02/19/2022   Procedure: AORTIC VALVE REPLACEMENT (AVR) 23mm SJM REGENT MECHANICAL AORTIC VALVE;  Surgeon: Melene Sportsman, MD;  Location: [facility] OR;  Service: Open Heart Surgery;  Laterality: N/A;   COLONOSCOPY WITH PROPOFOL  N/A 07/16/2021   Procedure: COLONOSCOPY WITH PROPOFOL ;  Surgeon: Kenney Peacemaker, MD;  Location: [facility];  Service: Gastroenterology;  Laterality: N/A;   CORONARY/GRAFT ACUTE MI REVASCULARIZATION N/A  08/28/2019   Procedure: Coronary/Graft Acute MI Revascularization;  Surgeon: Avanell Leigh, MD;  Location: [facility] INVASIVE CV LAB;  Service: Cardiovascular;  Laterality: N/A;   ICD IMPLANT N/A 11/28/2019   Procedure: ICD IMPLANT;  Surgeon: Verona Goodwill, MD;  Location: [facility] INVASIVE CV LAB;  Service: Cardiovascular;  Laterality: N/A;   LEFT HEART CATH AND CORONARY ANGIOGRAPHY N/A 08/28/2019   Procedure: LEFT HEART CATH AND CORONARY ANGIOGRAPHY;  Surgeon: Avanell Leigh, MD;  Location: [facility] INVASIVE CV LAB;  Service: Cardiovascular;  Laterality: N/A;   POLYPECTOMY  07/16/2021   Procedure: POLYPECTOMY;  Surgeon: Kenney Peacemaker, MD;  Location: [facility];  Service: Gastroenterology;;   RIGHT HEART CATH AND CORONARY ANGIOGRAPHY N/A 12/12/2021   Procedure: RIGHT HEART CATH AND CORONARY ANGIOGRAPHY;  Surgeon: Odie Benne, MD;  Location: [facility] INVASIVE CV LAB;  Service: Cardiovascular;  Laterality: N/A;   TEE WITHOUT CARDIOVERSION N/A 02/19/2022   Procedure: TRANSESOPHAGEAL ECHOCARDIOGRAM (TEE);  Surgeon: Melene Sportsman, MD;  Location: [facility] OR;  Service: Open Heart Surgery;  Laterality: N/A;   WISDOM TOOTH EXTRACTION      Family Psychiatric History: Notes that his mother and aunt had mental health issues. Notes he belives they had anxiety  Family History:  Family History  Problem Relation Age of Onset   Cancer Mother        peritoneal   Arrhythmia Father    Prostate cancer Father    Colon cancer Brother 21   Cancer Maternal Grandmother        type unknown   Heart attack Maternal Grandfather    Diabetes Paternal Grandmother    Heart attack Paternal Grandfather     Social History:  Social History   Socioeconomic History   Marital status: Married    Spouse name: Not on file   Number of children: 0   Years of education: Not on file   Highest education level: Not on file  Occupational History   Occupation: Clinical research associate   Occupation: Attorney/Writer  Tobacco Use   Smoking status:  Former    Current packs/day: 0.00    Types: Cigarettes    Quit date: 02/15/2022    Years since quitting: 1.2   Smokeless tobacco: Never   Tobacco comments:    x15 yrs as of 2021  Vaping Use   Vaping status: Never Used  Substance and Sexual Activity   Alcohol use: Not Currently    Comment: a couple of drinks on the weekends   Drug use: Never   Sexual activity: Yes    Birth control/protection: None  Other Topics Concern   Not on file  Social History Narrative   2 adopted children   Social Drivers of Health   Financial Resource Strain: Not on file  Food Insecurity: No Food Insecurity (02/20/2022)   Hunger Vital Sign    Worried About Running Out of Food in the Last Year: Never true    Ran Out of Food in the Last Year: Never true  Transportation Needs: No Transportation Needs (02/20/2022)   PRAPARE - Administrator, Civil Service (Medical): No    Lack of Transportation (Non-Medical): No  Physical Activity: Not on file  Stress: Not on file  Social Connections: Not on file    Allergies: No Known Allergies  Metabolic Disorder Labs: Lab Results  Component Value Date   HGBA1C 5.8 (H) 02/17/2022   MPG 119.76 02/17/2022   MPG 119.76 08/28/2019   No results found for: "PROLACTIN" Lab Results  Component Value Date   CHOL 102 11/18/2022   TRIG 103 11/18/2022   HDL 37 (L) 11/18/2022   CHOLHDL 2.8 11/18/2022   VLDL 21 11/18/2022   LDLCALC 44 11/18/2022   LDLCALC 46 09/16/2022   Lab Results  Component Value Date   TSH 3.181 08/28/2019    Therapeutic Level Labs: No results found for: "LITHIUM" No results found for: "VALPROATE" No results found for: "CBMZ"  Current Medications: Current Outpatient Medications  Medication Sig Dispense Refill   Brexpiprazole  (REXULTI ) 0.5 MG TABS Take 1-2 tablets (0.5-1 mg total) by mouth daily in the afternoon. Take one tablete 0.5 mg for a week and then increase to 2 tablets 1 mg on week 2. 60 tablet 3   acetaminophen   (TYLENOL ) 500 MG tablet Take 500 mg by mouth every 6 (six) hours as needed for headache.     aspirin  81 MG chewable tablet Chew 1 tablet (81 mg total) by mouth daily. 90 tablet 3   atorvastatin  (LIPITOR ) 80 MG tablet Take 1 tablet (80 mg total) by mouth daily. 30 tablet 5   carvedilol  (COREG ) 6.25 MG tablet Take 1 tablet (6.25 mg total) by mouth 2 (two) times daily. 60 tablet 6   dapagliflozin  propanediol (FARXIGA ) 10 MG TABS tablet Take 1 tablet (10 mg total) by mouth daily before breakfast. 30 tablet 11   digoxin  (LANOXIN ) 0.125 MG tablet Take 1/2 tablet (0.0625 mg total) by mouth daily. 45 tablet 3   ezetimibe  (ZETIA ) 10 MG tablet Take 1 tablet (10 mg total) by mouth daily. 90 tablet 3   fenofibrate  160 MG tablet Take 1 tablet (160 mg total) by mouth daily. 30 tablet 3   furosemide  (LASIX ) 20 MG tablet Take 1 tablet (20 mg total) by mouth daily. 30 tablet 6   hydrOXYzine  (ATARAX ) 25 MG tablet Take 1 tablet (25 mg total) by mouth 3 (three) times daily as needed. 90 tablet 3   melatonin 5 MG TABS Take 2 tablets (10 mg total) by mouth at bedtime. 30 tablet 3   mirtazapine  (REMERON ) 45 MG tablet Take 1 tablet (45 mg total) by mouth at bedtime. 30 tablet 3   Multiple Vitamin (MULTIVITAMIN WITH MINERALS) TABS tablet Take 1 tablet by mouth daily.     omega-3 acid ethyl esters (LOVAZA ) 1 g capsule Take 2 capsules (2 g total) by mouth 2 (two) times daily. 120 capsule 1   omeprazole  (PRILOSEC ) 20 MG capsule Take 20 mg by mouth daily before breakfast.     Probiotic Product (PROBIOTIC DAILY PO) Take 1 capsule by mouth with breakfast, with lunch, and with evening meal.     sacubitril -valsartan  (ENTRESTO ) 97-103 MG Take 1 tablet by mouth 2 (two) times daily. 60 tablet 11   SILDENAFIL CITRATE PO Take 45-90 mg by mouth daily as needed (ED). Take 1 - 2 tablets 30 to 45 min before sexual activity as needed     spironolactone  (ALDACTONE ) 50 MG tablet Take 1 tablet (50 mg total) by mouth  daily. 30 tablet 6    warfarin (COUMADIN ) 5 MG tablet Take 1.5 tablets to 2 tablets by mouth once daily as directed by Anticoagulation clinic 60 tablet 5   No current facility-administered medications for this visit.     Musculoskeletal: Strength & Muscle Tone: Normal, telehealth visit Gait & Station: normal, telehealth visit Patient leans: N/A  Psychiatric Specialty Exam: Review of Systems  There were no vitals taken for this visit.There is no height or weight on file to calculate BMI.  General Appearance: Well Groomed  Eye Contact:  Good  Speech:  Clear and Coherent and Normal Rate  Volume:  Normal  Mood:  Anxious and Depressed  Affect:  Appropriate and Congruent  Thought Process:  Coherent, Goal Directed and Linear  Orientation:  Full (Time, Place, and Person)  Thought Content: WDL and Logical   Suicidal Thoughts:  Yes.  without intent/plan  Homicidal Thoughts:  No  Memory:  Immediate;   Good Recent;   Good Remote;   Good  Judgement:  Good  Insight:  Good  Psychomotor Activity:  Normal  Concentration:  Concentration: Good and Attention Span: Good  Recall:  Good  Fund of Knowledge: Good  Language: Good  Akathisia:  No  Handed:  Right  AIMS (if indicated): Not done  Assets:  Communication Skills Desire for Improvement Financial Resources/Insurance Housing Intimacy Social Support  ADL's:  Intact  Cognition: WNL  Sleep:  Good   Screenings: AIMS    Flowsheet Row [facility]  AIMS Total Score 0      AUDIT    Flowsheet Row Video Visit from 05/20/2023 in [facility]  Alcohol Use Disorder Identification Test Final Score (AUDIT) 10 19      GAD-7    Flowsheet Row Video Visit from 05/20/2023 in [facility] Video Visit from 02/26/2023 in [facility] Video Visit from 12/17/2022 in [facility] Video Visit from 11/14/2022 in [facility] Video Visit from 08/15/2022 in [facility]  Total GAD-7 Score 13 10 6 10 9       PHQ2-9    Flowsheet Row Video Visit from 05/20/2023 in [facility] Video Visit from 02/26/2023 in [facility] Video Visit from 12/17/2022 in [facility] Video Visit from 11/14/2022 in [facility] CARDIAC REHAB PHASE II ORIENTATION from 10/09/2022 in [facility]  PHQ-2 Total Score 4 3 1 3 2   PHQ-9 Total Score 16 9 5 9 7       Flowsheet Row Video Visit from 05/20/2023 in [facility] Video Visit from 06/06/2022 in [facility] Admission (Discharged) from 02/19/2022 in [facility] 4E CV SURGICAL PROGRESSIVE CARE  C-SSRS RISK CATEGORY Error: Q7 should not be populated when Q6 is No Low Risk No Risk        Assessment and Plan: Patient notes that he has been more anxious and depressed.  3 weeks ago he relapsed on alcohol.  1. Generalized anxiety disorder  Continue- hydrOXYzine  (ATARAX ) 25 MG tablet; Take 1 tablet (25 mg total) by mouth 3 (three) times daily as needed.  Dispense: 90 tablet; Refill: 3 Continue- mirtazapine  (REMERON ) 45 MG tablet; Take 1 tablet (45 mg  total) by mouth at bedtime.  Dispense: 30 tablet; Refill: 3  2. Mild depression  Start- Brexpiprazole  (REXULTI ) 0.5 MG TABS; Take 1-2 tablets (0.5-1 mg total) by mouth daily in the afternoon. Take one tablete 0.5 mg for a week and then increase to 2 tablets 1 mg on week 2.  Dispense: 60 tablet; Refill: 3 Continue- mirtazapine  (REMERON ) 45 MG tablet; Take 1 tablet (45 mg total) by mouth at bedtime.  Dispense: 30 tablet; Refill: 3 Continue- melatonin 5 MG TABS; Take  2 tablets (10 mg total) by mouth at bedtime.  Dispense: 30 tablet; Refill: 3  Follow up in 2.5 months Follow up with therapy  Arlyne Bering, NP 05/20/2023, 1:49 PM

## 2023-05-21 ENCOUNTER — Other Ambulatory Visit: Payer: Self-pay

## 2023-05-22 ENCOUNTER — Other Ambulatory Visit (HOSPITAL_COMMUNITY): Payer: Self-pay

## 2023-05-22 ENCOUNTER — Other Ambulatory Visit: Payer: Self-pay

## 2023-05-22 DIAGNOSIS — I5022 Chronic systolic (congestive) heart failure: Secondary | ICD-10-CM

## 2023-05-22 MED ORDER — ATORVASTATIN CALCIUM 80 MG PO TABS
80.0000 mg | ORAL_TABLET | Freq: Every day | ORAL | 5 refills | Status: DC
  Filled 2023-05-22: qty 30, 30d supply, fill #0

## 2023-05-22 MED ORDER — ATORVASTATIN CALCIUM 80 MG PO TABS
80.0000 mg | ORAL_TABLET | Freq: Every day | ORAL | 3 refills | Status: AC
  Filled 2023-05-22: qty 90, 90d supply, fill #0
  Filled 2023-08-23: qty 90, 90d supply, fill #1
  Filled 2023-11-21: qty 90, 90d supply, fill #2
  Filled 2024-02-18: qty 90, 90d supply, fill #3

## 2023-05-25 ENCOUNTER — Other Ambulatory Visit: Payer: Self-pay

## 2023-06-01 ENCOUNTER — Ambulatory Visit: Attending: Cardiology

## 2023-06-01 ENCOUNTER — Other Ambulatory Visit (HOSPITAL_COMMUNITY): Payer: Self-pay | Admitting: Cardiology

## 2023-06-01 ENCOUNTER — Other Ambulatory Visit: Payer: Self-pay

## 2023-06-01 DIAGNOSIS — I513 Intracardiac thrombosis, not elsewhere classified: Secondary | ICD-10-CM | POA: Insufficient documentation

## 2023-06-01 LAB — POCT INR: INR: 2.9 (ref 2.0–3.0)

## 2023-06-01 NOTE — Patient Instructions (Signed)
 Description   Take 2.5 tablets today and then continue taking 2 tablets daily except 1.5 tablets on Thursdays and Saturdays.   Stay consistent with greens and protein drink each week  Recheck INR 4 weeks.  Coumadin  Clinic [phone] BILL [id number]

## 2023-06-04 ENCOUNTER — Other Ambulatory Visit: Payer: Self-pay

## 2023-06-04 MED ORDER — FUROSEMIDE 20 MG PO TABS
20.0000 mg | ORAL_TABLET | Freq: Every day | ORAL | 6 refills | Status: DC
  Filled 2023-06-04: qty 30, 30d supply, fill #0

## 2023-06-05 ENCOUNTER — Other Ambulatory Visit: Payer: Self-pay

## 2023-06-08 ENCOUNTER — Other Ambulatory Visit: Payer: Self-pay

## 2023-06-12 ENCOUNTER — Other Ambulatory Visit (HOSPITAL_COMMUNITY): Payer: Self-pay | Admitting: Cardiology

## 2023-06-14 ENCOUNTER — Other Ambulatory Visit (HOSPITAL_COMMUNITY): Payer: Self-pay | Admitting: Cardiology

## 2023-06-15 ENCOUNTER — Other Ambulatory Visit: Payer: Self-pay

## 2023-06-15 MED ORDER — CARVEDILOL 6.25 MG PO TABS
6.2500 mg | ORAL_TABLET | Freq: Two times a day (BID) | ORAL | 6 refills | Status: DC
  Filled 2023-06-15: qty 60, 30d supply, fill #0
  Filled 2023-07-17: qty 60, 30d supply, fill #1
  Filled 2023-08-15: qty 60, 30d supply, fill #2
  Filled 2023-09-19: qty 60, 30d supply, fill #3
  Filled 2023-10-16: qty 60, 30d supply, fill #4
  Filled 2023-11-14: qty 60, 30d supply, fill #5
  Filled 2023-12-12: qty 60, 30d supply, fill #6

## 2023-06-15 MED ORDER — FENOFIBRATE 160 MG PO TABS
160.0000 mg | ORAL_TABLET | Freq: Every day | ORAL | 3 refills | Status: DC
Start: 2023-06-15 — End: 2023-10-16
  Filled 2023-06-15: qty 30, 30d supply, fill #0
  Filled 2023-07-20: qty 30, 30d supply, fill #1
  Filled 2023-08-15: qty 30, 30d supply, fill #2
  Filled 2023-09-12: qty 30, 30d supply, fill #3

## 2023-06-16 ENCOUNTER — Other Ambulatory Visit: Payer: Self-pay

## 2023-06-17 ENCOUNTER — Telehealth (HOSPITAL_COMMUNITY): Payer: Self-pay | Admitting: Cardiology

## 2023-06-17 NOTE — Telephone Encounter (Signed)
 Called to confirm/remind patient of their appointment at the [facility] on 06/17/23 .   Appointment:   [] Confirmed  [x] Left mess   [] No answer/No voice mail  [] VM Full/unable to leave message  [] Phone not in service  Patient reminded to bring all medications and/or complete list.  Confirmed patient has transportation. Gave directions, instructed to utilize valet parking.

## 2023-06-18 ENCOUNTER — Ambulatory Visit (HOSPITAL_COMMUNITY)
Admission: RE | Admit: 2023-06-18 | Discharge: 2023-06-18 | Disposition: A | Discharge status: Home / Self Care | Source: Ambulatory Visit | Attending: Cardiology | Admitting: Cardiology

## 2023-06-18 ENCOUNTER — Encounter (HOSPITAL_COMMUNITY): Payer: Self-pay | Admitting: Cardiology

## 2023-06-18 ENCOUNTER — Other Ambulatory Visit: Payer: Self-pay

## 2023-06-18 ENCOUNTER — Other Ambulatory Visit (HOSPITAL_COMMUNITY): Payer: Self-pay | Admitting: Psychiatry

## 2023-06-18 VITALS — BP 104/60 | HR 85 | Wt 199.6 lb

## 2023-06-18 DIAGNOSIS — E785 Hyperlipidemia, unspecified: Secondary | ICD-10-CM | POA: Insufficient documentation

## 2023-06-18 DIAGNOSIS — Z7901 Long term (current) use of anticoagulants: Secondary | ICD-10-CM | POA: Insufficient documentation

## 2023-06-18 DIAGNOSIS — Z87891 Personal history of nicotine dependence: Secondary | ICD-10-CM | POA: Diagnosis not present

## 2023-06-18 DIAGNOSIS — Z79899 Other long term (current) drug therapy: Secondary | ICD-10-CM | POA: Insufficient documentation

## 2023-06-18 DIAGNOSIS — I252 Old myocardial infarction: Secondary | ICD-10-CM | POA: Insufficient documentation

## 2023-06-18 DIAGNOSIS — I5022 Chronic systolic (congestive) heart failure: Secondary | ICD-10-CM | POA: Diagnosis not present

## 2023-06-18 DIAGNOSIS — Z7982 Long term (current) use of aspirin: Secondary | ICD-10-CM | POA: Diagnosis not present

## 2023-06-18 DIAGNOSIS — F32A Depression, unspecified: Secondary | ICD-10-CM

## 2023-06-18 DIAGNOSIS — I2510 Atherosclerotic heart disease of native coronary artery without angina pectoris: Secondary | ICD-10-CM | POA: Diagnosis not present

## 2023-06-18 DIAGNOSIS — Z952 Presence of prosthetic heart valve: Secondary | ICD-10-CM | POA: Insufficient documentation

## 2023-06-18 DIAGNOSIS — I255 Ischemic cardiomyopathy: Secondary | ICD-10-CM | POA: Diagnosis not present

## 2023-06-18 LAB — BASIC METABOLIC PANEL WITH GFR
Anion gap: 8 (ref 5–15)
BUN: 31 mg/dL — ABNORMAL HIGH (ref 6–20)
CO2: 27 mmol/L (ref 22–32)
Calcium: 10.3 mg/dL (ref 8.9–10.3)
Chloride: 102 mmol/L (ref 98–111)
Creatinine, Ser: 1.24 mg/dL (ref 0.61–1.24)
GFR, Estimated: >60 mL/min (ref >60)
Glucose, Bld: 97 mg/dL (ref 70–99)
Potassium: 4.4 mmol/L (ref 3.5–5.1)
Sodium: 137 mmol/L (ref 135–145)

## 2023-06-18 LAB — LIPID PANEL
Cholesterol: 143 mg/dL (ref 0–200)
HDL: 36 mg/dL — ABNORMAL LOW (ref >40)
LDL Cholesterol: 63 mg/dL (ref 0–99)
Total CHOL/HDL Ratio: 4 ratio
Triglycerides: 222 mg/dL — ABNORMAL HIGH (ref <150)
VLDL: 44 mg/dL — ABNORMAL HIGH (ref 0–40)

## 2023-06-18 LAB — DIGOXIN LEVEL: Digoxin Level: <0.4 ng/mL — ABNORMAL LOW (ref 0.8–2.0)

## 2023-06-18 LAB — BRAIN NATRIURETIC PEPTIDE: B Natriuretic Peptide: 48 pg/mL (ref 0.0–100.0)

## 2023-06-18 MED ORDER — FUROSEMIDE 20 MG PO TABS
40.0000 mg | ORAL_TABLET | Freq: Every day | ORAL | 3 refills | Status: AC
  Filled 2023-06-18 (×2): qty 180, 90d supply, fill #0
  Filled 2023-06-19: qty 60, 30d supply, fill #0
  Filled 2023-07-17: qty 60, 30d supply, fill #1
  Filled 2023-08-15: qty 60, 30d supply, fill #2
  Filled 2023-09-19: qty 60, 30d supply, fill #3
  Filled 2023-10-16: qty 60, 30d supply, fill #4
  Filled 2023-11-14: qty 60, 30d supply, fill #5
  Filled 2023-12-19: qty 60, 30d supply, fill #6
  Filled 2024-01-17: qty 60, 30d supply, fill #7
  Filled 2024-02-15: qty 60, 30d supply, fill #8

## 2023-06-18 NOTE — Patient Instructions (Signed)
 INCREASE Lasix  to 40 mg daily.  Labs done today, your results will be available in MyChart, we will contact you for abnormal readings.  Repeat blood work in 10 days.  Your physician recommends that you schedule a follow-up appointment in: 2 months.  If you have any questions or concerns before your next appointment please send us  a message through [place] or call our office at [phone].    TO LEAVE A MESSAGE FOR THE NURSE SELECT OPTION 2, PLEASE LEAVE A MESSAGE INCLUDING: YOUR NAME DATE OF BIRTH CALL BACK NUMBER REASON FOR CALL**this is important as we prioritize the call backs  YOU WILL RECEIVE A CALL BACK THE SAME DAY AS LONG AS YOU CALL BEFORE 4:00 PM  At the [facility], you and your health needs are our priority. As part of our continuing mission to provide you with exceptional heart care, we have created designated Provider Care Teams. These Care Teams include your primary Cardiologist (physician) and Advanced Practice Providers (APPs- Physician Assistants and Nurse Practitioners) who all work together to provide you with the care you need, when you need it.   You may see any of the following providers on your designated Care Team at your next follow up: Dr Jules Oar Dr Peder Bourdon Dr. Alwin Baars Dr. Arta Lark Amy Marijane Shoulders, NP Ruddy Corral, [place] [facility] [place], [place] Dennise Fitz, NP [place] Lee, NP Shawnee Dellen, NP Luster Salters, PharmD Bevely Brush, PharmD   Please be sure to bring in all your medications bottles to every appointment.    Thank you for choosing [facility]

## 2023-06-19 ENCOUNTER — Other Ambulatory Visit (HOSPITAL_COMMUNITY): Payer: Self-pay | Admitting: Psychiatry

## 2023-06-19 ENCOUNTER — Other Ambulatory Visit: Payer: Self-pay

## 2023-06-19 ENCOUNTER — Ambulatory Visit (HOSPITAL_COMMUNITY): Payer: Self-pay | Admitting: Cardiology

## 2023-06-19 DIAGNOSIS — F32A Depression, unspecified: Secondary | ICD-10-CM

## 2023-06-19 MED ORDER — REXULTI 0.5 MG PO TABS
0.5000 mg | ORAL_TABLET | Freq: Every day | ORAL | 3 refills | Status: DC
  Filled 2023-06-19: qty 60, 30d supply, fill #0
  Filled 2023-07-17: qty 60, 30d supply, fill #1
  Filled 2023-08-18: qty 60, 30d supply, fill #2

## 2023-06-19 NOTE — Progress Notes (Signed)
 Advanced Heart Failure Clinic Note   PCP: Pcp, [name] Cardiology: Dr. Mitzie Anda  Chief complaint: CHF  HPI: 58 y.o. male smoker was admitted on 08/28/19 for delayed presentation anterior STEMI. Had had >36 hr of chest pain prior to seeking medical attention. Pain progressed to more pleuritic like CP. Found to have anterior ST elevations w/ precordial Q waves on admit. Hs troponin >27,000. Urgent cardiac cath showed totally occluded mid LAD w/o collaterals and 40% mid RCA stenosis. It was suspected he had completed his infarct and residual pleuritic CP was post-MI pericarditis. No intervention was performed. 2D echo demonstrated moderately reduced LVEF, 45-50%, + apical thrombus and moderate AS. MV normal. RV systolic function mildly reduced.    He was placed on medical management for CAD and systolic HF. He was started on ASA, atorvastatin , Losartan  and Coreg . Coumadin  started for apical thrombus w/ heparin  bridge.   Post cath, there were initial concerns for developing cardiogenic shock but he remained stable and did not require pressor/inotropic support. Co-ox remained stable. Repeat echo showed further reduction of LVEF down to 30-35% but no post MI mechanical complications. RV mildly reduced. He did require IV Lasix  for pulmonary edema and volume status and dyspnea improved w/ diuresis. He was continued on GTDMT w/ losartan , spironolactone  and Coreg . BP too soft for Entresto . He was treated w/ colchicine  for post MI pericarditis w/ improvement in pleuritic chest pain. Given anterior MI w/ EF <35%, he was fitted w/ a LifeVest prior to discharge w/ plans to get repeat echo in 1 month.   He had repeat echo in 9/21, showing EF 25-30%.  He had [employer] ICD placed in 11/21.  Echo in 8/22 showed EF 30% with no LV thrombus, normal RV, normal IVC, moderate AS with mean gradient 28 mmHg and AVA 1.08 cm^2. Echo in 12/22 showed EF 30-35%, no LV thrombus, possibly bicuspid aortic valve with mean  gradient 34 mmHg and AVA 1.02 cm^2, normal RV, IVC normal.   Echo was done in 8/23 showing LV thrombus.  INR goal on warfarin was increased to 2.5-3.5.  Echo 10/23 showed EF remains 25-30% with peri-apical akinesis and a chronic-appearing LV thrombus, severe low flow/low gradient aortic stenosis (mean gradient 27 mmHg with AVA 0.88 cm^2), bicuspid aortic valve, normal RV, normal IVC. INR increased to 3-3.5.  We have discussed barostimulation activation therapy, but Medicaid does not have good coverage for this.   Follow up 10/23, continued with NYHA II-III symptoms. Referred to structural heart team for TAVR work up, underwent L/RHC showing chronic occlusion of mLAD without target and moderate RCA stenosis, severe AS and normal right heart pressures. Decided SAVR was best option with LV thrombus.  Seen in ED 01/11/22 with acute ETOH intoxication and LOC. BP low, given IVF with improvement. Discharged home.  In 1/24, he had AVR with St Jude mechanical AoV.  Post-op, he was taken off dapagliflozin , Entresto , and spironolactone . He quit smoking a couple of weeks before AVR.   Echo in 10/24 showed EF 35-40% with aneurysmal apex, mild LVH, normal RV, mechanical aortic valve functioning normally.   Today he returns for HF follow up. He continues to go to the gym and works out on the treadmill.  He gets short of breath if he "moves fast," does ok with slow and steady exertion. Mild orthopnea, sleeps on his side.  Weight is down but he feels like he is retaining fluid.  No lightheadedness.  No palpitations.    Research officer, political party  interrogation (personally reviewed): HeartLogic score 15, no VT  ECG (personally reviewed): NSR, old inferior MI, old ALMI  Labs (1/24): K 3.9, creatinine 1.08 Labs (3/24): K 4.2, SCr 1.18 Labs (5/24): K 4.6, creatinine 1.04, hgb 11.9 Labs (8/24): LDL 46, TGs 368, hgb 14.9 Labs (9/24): K 4.3, creatinine 1.13 Labs (10/24): LDL 44, TGs 103, K 4.3, creatinine  1.13  PMH: 1. GERD 2. Depression  3. CAD: Anterior STEMI in 8/21 with totally occluded LAD/no collaterals, 40% mid RCA.  Due to late presentation, no PCI was done.  - Had post-infarct pericarditis.  - LHC (11/23): mLAD 100% stenosed, pRCA 50% stenosed 4. Chronic systolic CHF: Ischemic cardiomyopathy. [employer] ICD.  - Echo (9/21) with EF 25-30%, normal RV, moderate AS with mean gradient 22 mmHg.  - Echo (8/22): EF 30% with no LV thrombus, normal RV, normal IVC, moderate AS with mean gradient 28 mmHg and AVA 1.08 cm^2.  - Echo (12/22): EF 30-35%, no LV thrombus, possibly bicuspid aortic valve with mean gradient 34 mmHg and AVA 1.02 cm^2, normal RV, IVC normal.  - Echo (10/23): EF remains 25-30% with peri-apical akinesis and a chronic-appearing LV thrombus, severe low flow/low gradient aortic stenosis (mean gradient 27 mmHg with AVA 0.88 cm^2), bicuspid aortic valve, normal RV, normal IVC. - R/LHC (11/23): mLAD 100% stenosed, pRCA 50% stenosed; RA mean 3, PA 25/14 mean 18, PCWP 13, CO/CI (Fick) 5.03/2.54 - TEE 1/24 EF 20-30%, normal RV - Echo (10/24): EF 35-40% with aneurysmal apex, mild LVH, normal RV, mechanical aortic valve functioning normally.  5. LV thrombus: Apical thrombus noted on echo at time of MI in 8/21. Resolved by 9/21 echo.  - Echo 10/23 with chronic-appearing, immobile LV thrombus.  6. Aortic stenosis: Bicuspid valve. Moderate on 12/22 echo.  - 10/23 echo showed low flow/low gradient severe AS.   - St Jude mechanical AVR in 1/24 7. Prior smoker.   Current Outpatient Medications  Medication Sig Dispense Refill   acetaminophen  (TYLENOL ) 500 MG tablet Take 500 mg by mouth every 6 (six) hours as needed for headache.     aspirin  81 MG chewable tablet Chew 1 tablet (81 mg total) by mouth daily. 90 tablet 3   atorvastatin  (LIPITOR ) 80 MG tablet Take 1 tablet (80 mg total) by mouth daily. 90 tablet 3   brexpiprazole  (REXULTI ) 1 MG TABS tablet Take 1 mg by mouth daily.      carvedilol  (COREG ) 6.25 MG tablet Take 1 tablet (6.25 mg total) by mouth 2 (two) times daily. 60 tablet 6   dapagliflozin  propanediol (FARXIGA ) 10 MG TABS tablet Take 1 tablet (10 mg total) by mouth daily before breakfast. 30 tablet 11   digoxin  (LANOXIN ) 0.125 MG tablet Take 1/2 tablet (0.0625 mg total) by mouth daily. 45 tablet 3   ezetimibe  (ZETIA ) 10 MG tablet Take 1 tablet (10 mg total) by mouth daily. 90 tablet 3   fenofibrate  160 MG tablet Take 1 tablet (160 mg total) by mouth daily. 30 tablet 3   hydrOXYzine  (ATARAX ) 25 MG tablet Take 1 tablet (25 mg total) by mouth 3 (three) times daily as needed. 90 tablet 3   melatonin 5 MG TABS Take 2 tablets (10 mg total) by mouth at bedtime. 30 tablet 3   mirtazapine  (REMERON ) 45 MG tablet Take 1 tablet (45 mg total) by mouth at bedtime. 30 tablet 3   Multiple Vitamin (MULTIVITAMIN WITH MINERALS) TABS tablet Take 1 tablet by mouth daily.     omega-3 acid ethyl esters (  LOVAZA ) 1 g capsule Take 2 capsules (2 g total) by mouth 2 (two) times daily. 120 capsule 1   omeprazole  (PRILOSEC ) 20 MG capsule Take 20 mg by mouth daily before breakfast.     Probiotic Product (PROBIOTIC DAILY PO) Take 1 capsule by mouth with breakfast, with lunch, and with evening meal.     sacubitril -valsartan  (ENTRESTO ) 97-103 MG Take 1 tablet by mouth 2 (two) times daily. 60 tablet 11   SILDENAFIL CITRATE PO Take 45-90 mg by mouth daily as needed (ED). Take 1 - 2 tablets 30 to 45 min before sexual activity as needed     spironolactone  (ALDACTONE ) 50 MG tablet Take 1 tablet (50 mg total) by mouth daily. 30 tablet 6   warfarin (COUMADIN ) 5 MG tablet Take 1.5 tablets to 2 tablets by mouth once daily as directed by Anticoagulation clinic 60 tablet 5   Brexpiprazole  (REXULTI ) 0.5 MG TABS Take one tablet (0.5 mg) for a week and then increase to 2 tablets (1 mg) on week 2. 60 tablet 3   furosemide  (LASIX ) 20 MG tablet Take 2 tablets (40 mg total) by mouth daily. 180 tablet 3   No  current facility-administered medications for this encounter.   No Known Allergies  Social History   Socioeconomic History   Marital status: Married    Spouse name: Not on file   Number of children: 0   Years of education: Not on file   Highest education level: Not on file  Occupational History   Occupation: Clinical research associate   Occupation: Attorney/Writer  Tobacco Use   Smoking status: Former    Current packs/day: 0.00    Types: Cigarettes    Quit date: 02/15/2022    Years since quitting: 1.3   Smokeless tobacco: Never   Tobacco comments:    x15 yrs as of 2021  Vaping Use   Vaping status: Never Used  Substance and Sexual Activity   Alcohol use: Not Currently    Comment: a couple of drinks on the weekends   Drug use: Never   Sexual activity: Yes    Birth control/protection: None  Other Topics Concern   Not on file  Social History Narrative   2 adopted children   Social Drivers of Health   Financial Resource Strain: Not on file  Food Insecurity: No Food Insecurity (02/20/2022)   Hunger Vital Sign    Worried About Running Out of Food in the Last Year: Never true    Ran Out of Food in the Last Year: Never true  Transportation Needs: No Transportation Needs (02/20/2022)   PRAPARE - Administrator, Civil Service (Medical): No    Lack of Transportation (Non-Medical): No  Physical Activity: Not on file  Stress: Not on file  Social Connections: Not on file  Intimate Partner Violence: Not At Risk (02/20/2022)   Humiliation, Afraid, Rape, and Kick questionnaire    Fear of Current or Ex-Partner: No    Emotionally Abused: No    Physically Abused: No    Sexually Abused: No   Family History  Problem Relation Age of Onset   Cancer Mother        peritoneal   Arrhythmia Father    Prostate cancer Father    Colon cancer Brother 54   Cancer Maternal Grandmother        type unknown   Heart attack Maternal Grandfather    Diabetes Paternal Grandmother    Heart attack Paternal  Grandfather    BP 104/60  Pulse 85   Wt 90.5 kg (199 lb 9.6 oz)   SpO2 95%   BMI 29.48 kg/m   Wt Readings from Last 3 Encounters:  06/18/23 90.5 kg (199 lb 9.6 oz)  12/22/22 89 kg (196 lb 3.4 oz)  10/28/22 93.4 kg (205 lb 12.8 oz)   PHYSICAL EXAM: General: NAD Neck: No JVD, no thyromegaly or thyroid nodule.  Lungs: Clear to auscultation bilaterally with normal respiratory effort. CV: Nondisplaced PMI.  Heart regular S1/S2 with mechanical S2, no S3/S4, 1/6 SEM RUSB.  Trace ankle edema.  No carotid bruit.  Normal pedal pulses.  Abdomen: Soft, nontender, no hepatosplenomegaly, no distention.  Skin: Intact without lesions or rashes.  Neurologic: Alert and oriented x 3.  Psych: Normal affect. Extremities: No clubbing or cyanosis.  HEENT: Normal.   ASSESSMENT & PLAN: 1. CAD: Admission 8/21 for acute anterior MI, delayed presentation with no ischemic pain, had pleuritic-type chest pain (post-MI pericarditis). LHC with totally occluded mid LAD w/o collaterals. Suspected completed MI, no intervention.  Repeat LHC in 11/23 with mLAD 100% stenosed, pRCA 50% stenosed.  No chest pain.  - Continue ASA 81 daily.   - Continue atorvastatin  80 mg daily and Zetia  10 daily, check lipids today.   2. Chronic systolic CHF: Ischemic cardiomyopathy.  Reduced EF post anterior MI.  Echo in 9/21 with EF 25-30%, peri-apical akinesis, moderate AS. Cath w/ occluded mLAD but no intervention as he had completed his infarct. He has [employer] ICD.  Echo in 8/22 with EF 30%.  Echo in 12/22 with EF 30-35%, normal RV, moderate AS. Echo 10/23 showed EF remains 25-30% with peri-apical akinesis and a chronic-appearing LV thrombus, severe low flow/low gradient aortic stenosis (mean gradient 27 mmHg with AVA 0.88 cm^2), bicuspid aortic valve, normal RV, normal IVC.  Now s/p mechanical AVR.  TEE 1/24 EF 20-30%, normal RV.  NYHA class II-III.  Not obviously volume overloaded on exam but HeartLogic score is elevated and he  feels more short of breath.  - Increase Lasix  to 40 mg daily.  BMET/BNP today and BMET in 10 days.  - Continue Entresto  97/103 bid. - Continue spironolactone  50 mg daily.  - Continue Coreg  6.25 mg bid.  - Continue dapagliflozin  10 mg daily.  - Continue digoxin  0.0625, check level today.   - Would like him to get barostimulation activation therapy device but Medicaid will not cover.   3. 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   4. LV thrombus: Large LV thrombus noted on echo in 8/21, resolved on 9/21 echo.  Thrombus had recurred on 8/23 echo and warfarin was increased to INR goal 2.5-3.5 and ASA 81 was added.  Echo 10/23, there was still an LV thrombus, though it was smaller than in 8/23. Not mobile.  - Continue warfarin with INR goal 2.5-3.5 and continue ASA 81 daily.  5. Tobacco use: He has quit smoking again using Chantix .  6. Hyperlipidemia:  - Continue atorvastatin  80 mg daily. - Continue Zetia  10 mg daily. - Continue fenofibrate  160 mg daily. - Continue Lovaza  2 g bid. - Check lipids today.  7. Aortic stenosis: Bicuspid aortic valve with moderate AS on 12/22 echo. Echo in 10/23 showed severe low flow/low gradient aortic stenosis with mean gradient 27 mmHg and AVA 0.88 cm^2.  He had SAVR 02/19/22 with Dr. Honey Lusty with St Jude mechanical aortic valve.  - Continue ASA 81 + warfarin (INR goal 2.5-3.5).  8. Alcohol abuse: He has quit. Congratulated.  Follow up in 2 months  with APP.   I spent 32 minutes reviewing records, interviewing/examining patient, and managing orders.   Peder Bourdon, MD  06/19/2023

## 2023-06-25 ENCOUNTER — Ambulatory Visit

## 2023-06-25 DIAGNOSIS — I255 Ischemic cardiomyopathy: Secondary | ICD-10-CM

## 2023-06-29 ENCOUNTER — Ambulatory Visit

## 2023-06-29 ENCOUNTER — Ambulatory Visit
Admission: RE | Admit: 2023-06-29 | Discharge: 2023-06-29 | Disposition: A | Discharge status: Home / Self Care | Source: Ambulatory Visit | Attending: Cardiology | Admitting: Cardiology

## 2023-06-29 ENCOUNTER — Other Ambulatory Visit: Payer: Self-pay

## 2023-06-29 DIAGNOSIS — I5022 Chronic systolic (congestive) heart failure: Secondary | ICD-10-CM | POA: Insufficient documentation

## 2023-06-29 DIAGNOSIS — Z7901 Long term (current) use of anticoagulants: Secondary | ICD-10-CM

## 2023-06-29 DIAGNOSIS — Z5181 Encounter for therapeutic drug level monitoring: Secondary | ICD-10-CM

## 2023-06-29 DIAGNOSIS — I513 Intracardiac thrombosis, not elsewhere classified: Secondary | ICD-10-CM

## 2023-06-29 LAB — BASIC METABOLIC PANEL WITH GFR
Anion gap: 10 (ref 5–15)
BUN: 24 mg/dL — ABNORMAL HIGH (ref 6–20)
CO2: 25 mmol/L (ref 22–32)
Calcium: 9.3 mg/dL (ref 8.9–10.3)
Chloride: 102 mmol/L (ref 98–111)
Creatinine, Ser: 1.25 mg/dL — ABNORMAL HIGH (ref 0.61–1.24)
GFR, Estimated: >60 mL/min (ref >60)
Glucose, Bld: 92 mg/dL (ref 70–99)
Potassium: 4.2 mmol/L (ref 3.5–5.1)
Sodium: 137 mmol/L (ref 135–145)

## 2023-06-29 LAB — POCT INR: INR: 3.7 — AB (ref 2.0–3.0)

## 2023-06-29 NOTE — Patient Instructions (Signed)
 continue taking 2 tablets daily except 1.5 tablets on Thursdays and Saturdays.   Stay consistent with greens and protein drink each week.  Eat greens tonight Recheck INR 6 weeks.  Coumadin  Clinic [phone] BILL [id number]

## 2023-07-01 LAB — CUP PACEART REMOTE DEVICE CHECK
Battery Remaining Longevity: 162 mo
Battery Remaining Percentage: 100 %
Brady Statistic RV Percent Paced: 0 %
Date Time Interrogation Session: 20250531231300
HighPow Impedance: 66 Ohm
Implantable Lead Connection Status: 753985
Implantable Lead Implant Date: 20211101
Implantable Lead Location: 753860
Implantable Lead Model: 138
Implantable Lead Serial Number: 303500
Implantable Pulse Generator Implant Date: 20211101
Lead Channel Impedance Value: 467 Ohm
Lead Channel Setting Pacing Amplitude: 2.5 V
Lead Channel Setting Pacing Pulse Width: 0.4 ms
Lead Channel Setting Sensing Sensitivity: 0.5 mV
Pulse Gen Serial Number: 209902

## 2023-07-03 ENCOUNTER — Other Ambulatory Visit: Payer: Self-pay | Admitting: Cardiology

## 2023-07-05 ENCOUNTER — Ambulatory Visit: Payer: Self-pay | Admitting: Cardiology

## 2023-07-06 ENCOUNTER — Other Ambulatory Visit: Payer: Self-pay

## 2023-07-07 ENCOUNTER — Other Ambulatory Visit: Payer: Self-pay

## 2023-07-07 MED ORDER — OMEGA-3-ACID ETHYL ESTERS 1 G PO CAPS
2.0000 g | ORAL_CAPSULE | Freq: Two times a day (BID) | ORAL | 1 refills | Status: DC
Start: 2023-07-07 — End: 2023-09-06
  Filled 2023-07-07: qty 120, 30d supply, fill #0
  Filled 2023-08-07: qty 120, 30d supply, fill #1

## 2023-07-09 ENCOUNTER — Other Ambulatory Visit: Payer: Self-pay

## 2023-07-15 ENCOUNTER — Telehealth (HOSPITAL_COMMUNITY): Admitting: Psychiatry

## 2023-07-15 DIAGNOSIS — E039 Hypothyroidism, unspecified: Secondary | ICD-10-CM | POA: Diagnosis not present

## 2023-07-15 DIAGNOSIS — M810 Age-related osteoporosis without current pathological fracture: Secondary | ICD-10-CM | POA: Diagnosis not present

## 2023-07-15 DIAGNOSIS — Z125 Encounter for screening for malignant neoplasm of prostate: Secondary | ICD-10-CM | POA: Diagnosis not present

## 2023-07-15 DIAGNOSIS — Z7289 Other problems related to lifestyle: Secondary | ICD-10-CM | POA: Diagnosis not present

## 2023-07-15 DIAGNOSIS — E7800 Pure hypercholesterolemia, unspecified: Secondary | ICD-10-CM | POA: Diagnosis not present

## 2023-07-15 DIAGNOSIS — Z79899 Other long term (current) drug therapy: Secondary | ICD-10-CM | POA: Diagnosis not present

## 2023-07-15 DIAGNOSIS — Z131 Encounter for screening for diabetes mellitus: Secondary | ICD-10-CM | POA: Diagnosis not present

## 2023-07-15 DIAGNOSIS — R5383 Other fatigue: Secondary | ICD-10-CM | POA: Diagnosis not present

## 2023-07-20 ENCOUNTER — Other Ambulatory Visit: Payer: Self-pay

## 2023-07-21 ENCOUNTER — Other Ambulatory Visit: Payer: Self-pay

## 2023-07-27 ENCOUNTER — Other Ambulatory Visit: Payer: Self-pay

## 2023-08-03 ENCOUNTER — Other Ambulatory Visit: Payer: Self-pay

## 2023-08-05 ENCOUNTER — Other Ambulatory Visit: Payer: Self-pay

## 2023-08-05 MED ORDER — LEVOTHYROXINE SODIUM 50 MCG PO TABS
50.0000 ug | ORAL_TABLET | Freq: Every day | ORAL | 0 refills | Status: DC
  Filled 2023-08-05: qty 90, 90d supply, fill #0

## 2023-08-10 ENCOUNTER — Ambulatory Visit: Attending: Cardiology

## 2023-08-10 DIAGNOSIS — I513 Intracardiac thrombosis, not elsewhere classified: Secondary | ICD-10-CM | POA: Diagnosis present

## 2023-08-10 LAB — POCT INR: INR: 3.5 — AB (ref 2.0–3.0)

## 2023-08-10 NOTE — Patient Instructions (Signed)
 Description   continue taking 2 tablets daily except 1.5 tablets on Thursdays and Saturdays.   Stay consistent with greens and protein drink each week.  Eat greens tonight Recheck INR 6 weeks.  Coumadin  Clinic [phone] BILL [id number]

## 2023-08-10 NOTE — Progress Notes (Signed)
Please see anticoagulation encounter.

## 2023-08-14 NOTE — Progress Notes (Signed)
 Remote ICD transmission.

## 2023-08-14 NOTE — Addendum Note (Signed)
 Addended by: VICCI SELLER A on: 08/14/2023 11:57 AM   Modules accepted: Orders

## 2023-08-17 ENCOUNTER — Telehealth (HOSPITAL_COMMUNITY): Payer: Self-pay | Admitting: *Deleted

## 2023-08-17 ENCOUNTER — Other Ambulatory Visit: Payer: Self-pay

## 2023-08-17 NOTE — Progress Notes (Signed)
 [facility] Note   PCP: Pcp, [name] Cardiology: Dr. Rolan  HPI: 58 y.o. male smoker was admitted on 08/28/19 for delayed presentation anterior STEMI. Had had >36 hr of chest pain prior to seeking medical attention. Pain progressed to more pleuritic like CP. Found to have anterior ST elevations w/ precordial Q waves on admit. Hs troponin >27,000. Urgent cardiac cath showed totally occluded mid LAD w/o collaterals and 40% mid RCA stenosis. It was suspected he had completed his infarct and residual pleuritic CP was post-MI pericarditis. No intervention was performed. 2D echo demonstrated moderately reduced LVEF, 45-50%, + apical thrombus and moderate AS. MV normal. RV systolic function mildly reduced.    He was placed on medical management for CAD and systolic HF. He was started on ASA, atorvastatin , Losartan  and Coreg . Coumadin  started for apical thrombus w/ heparin  bridge.   Post cath, there were initial concerns for developing cardiogenic shock but he remained stable and did not require pressor/inotropic support. Co-ox remained stable. Repeat echo showed further reduction of LVEF down to 30-35% but no post MI mechanical complications. RV mildly reduced. He did require IV Lasix  for pulmonary edema and volume status and dyspnea improved w/ diuresis. He was continued on GTDMT w/ losartan , spironolactone  and Coreg . BP too soft for Entresto . He was treated w/ colchicine  for post MI pericarditis w/ improvement in pleuritic chest pain. Given anterior MI w/ EF <35%, he was fitted w/ a LifeVest prior to discharge w/ plans to get repeat echo in 1 month.   He had repeat echo in 9/21, showing EF 25-30%.  He had [employer] ICD placed in 11/21.  Echo in 8/22 showed EF 30% with no LV thrombus, normal RV, normal IVC, moderate AS with mean gradient 28 mmHg and AVA 1.08 cm^2. Echo in 12/22 showed EF 30-35%, no LV thrombus, possibly bicuspid aortic valve with mean gradient 34 mmHg and AVA 1.02  cm^2, normal RV, IVC normal.   Echo was done in 8/23 showing LV thrombus.  INR goal on warfarin was increased to 2.5-3.5.  Echo 10/23 showed EF remains 25-30% with peri-apical akinesis and a chronic-appearing LV thrombus, severe low flow/low gradient aortic stenosis (mean gradient 27 mmHg with AVA 0.88 cm^2), bicuspid aortic valve, normal RV, normal IVC. INR increased to 3-3.5.  We have discussed barostimulation activation therapy, but Medicaid does not have good coverage for this.   Follow up 10/23, continued with NYHA II-III symptoms. Referred to structural heart team for TAVR work up, underwent L/RHC showing chronic occlusion of mLAD without target and moderate RCA stenosis, severe AS and normal right heart pressures. Decided SAVR was best option with LV thrombus.  Seen in ED 01/11/22 with acute ETOH intoxication and LOC. BP low, given IVF with improvement. Discharged home.  In 1/24, he had AVR with St Jude mechanical AoV.  Post-op, he was taken off dapagliflozin , Entresto , and spironolactone . He quit smoking a couple of weeks before AVR.   Echo in 10/24 showed EF 35-40% with aneurysmal apex, mild LVH, normal RV, mechanical aortic valve functioning normally.   Today he returns for HF follow up with his wife. Overall feeling fine. Working out doing TM x 50 minutes and lifts weights, enjoys kayaking. He has SOB is he pushes himself too hard, otherwise no dyspnea. Occasional positional dizziness. Denies palpitations, abnormal bleeding, CP,  edema, or PND/Orthopnea. Weight at home 195 pounds. Taking all medications. Smoking 1/2 ppd, no ETOH or drugs. Interested in losing weigh, especially when he  tries to stop smoking.  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  Labs (1/24): K 3.9, creatinine 1.08 Labs (3/24): K 4.2, SCr 1.18 Labs  (5/24): K 4.6, creatinine 1.04, hgb 11.9 Labs (8/24): LDL 46, TGs 368, hgb 14.9 Labs (9/24): K 4.3, creatinine 1.13 Labs (10/24): LDL 44, TGs 103, K 4.3, creatinine 1.13 Labs (5/25): LDL 63 Labs (6/25): K 4.2, creatinine 1.25  PMH: 1. GERD 2. Depression  3. CAD: Anterior STEMI in 8/21 with totally occluded LAD/no collaterals, 40% mid RCA.  Due to late presentation, no PCI was done.  - Had post-infarct pericarditis.  - LHC (11/23): mLAD 100% stenosed, pRCA 50% stenosed 4. Chronic systolic CHF: Ischemic cardiomyopathy. [employer] ICD.  - Echo (9/21) with EF 25-30%, normal RV, moderate AS with mean gradient 22 mmHg.  - Echo (8/22): EF 30% with no LV thrombus, normal RV, normal IVC, moderate AS with mean gradient 28 mmHg and AVA 1.08 cm^2.  - Echo (12/22): EF 30-35%, no LV thrombus, possibly bicuspid aortic valve with mean gradient 34 mmHg and AVA 1.02 cm^2, normal RV, IVC normal.  - Echo (10/23): EF remains 25-30% with peri-apical akinesis and a chronic-appearing LV thrombus, severe low flow/low gradient aortic stenosis (mean gradient 27 mmHg with AVA 0.88 cm^2), bicuspid aortic valve, normal RV, normal IVC. - R/LHC (11/23): mLAD 100% stenosed, pRCA 50% stenosed; RA mean 3, PA 25/14 mean 18, PCWP 13, CO/CI (Fick) 5.03/2.54 - TEE 1/24 EF 20-30%, normal RV - Echo (10/24): EF 35-40% with aneurysmal apex, mild LVH, normal RV, mechanical aortic valve functioning normally.  5. LV thrombus: Apical thrombus noted on echo at time of MI in 8/21. Resolved by 9/21 echo.  - Echo 10/23 with chronic-appearing, immobile LV thrombus.  6. Aortic stenosis: Bicuspid valve. Moderate on 12/22 echo.  - 10/23 echo showed low flow/low gradient severe AS.   - St Jude mechanical AVR in 1/24 7. Prior smoker.   Current Outpatient Medications  Medication Sig Dispense Refill   acetaminophen  (TYLENOL ) 500 MG tablet Take 500 mg by mouth every 6 (six) hours as needed for headache.     aspirin  81 MG chewable tablet  Chew 1 tablet (81 mg total) by mouth daily. 90 tablet 3   atorvastatin  (LIPITOR ) 80 MG tablet Take 1 tablet (80 mg total) by mouth daily. 90 tablet 3   brexpiprazole  (REXULTI ) 1 MG TABS tablet Take 1 mg by mouth daily.     carvedilol  (COREG ) 6.25 MG tablet Take 1 tablet (6.25 mg total) by mouth 2 (two) times daily. 60 tablet 6   dapagliflozin  propanediol (FARXIGA ) 10 MG TABS tablet Take 1 tablet (10 mg total) by mouth daily before breakfast. 30 tablet 11   digoxin  (LANOXIN ) 0.125 MG tablet Take 1/2 tablet (0.0625 mg total) by mouth daily. 45 tablet 3   ezetimibe  (ZETIA ) 10 MG tablet Take 1 tablet (10 mg total) by mouth daily. 90 tablet 3   fenofibrate  160 MG tablet Take 1 tablet (160 mg total) by mouth daily. 30 tablet 3   furosemide  (LASIX ) 20 MG tablet Take 2 tablets (40 mg total) by mouth daily. 180 tablet 3   hydrOXYzine  (ATARAX ) 25 MG tablet Take 1 tablet (25 mg total) by mouth 3 (three) times daily as needed. 90 tablet 3   levothyroxine  (SYNTHROID ) 50 MCG tablet Take 1 tablet (50 mcg total) by mouth daily. 90 tablet  0   melatonin 5 MG TABS Take 2 tablets (10 mg total) by mouth at bedtime. 30 tablet 3   mirtazapine  (REMERON ) 45 MG tablet Take 1 tablet (45 mg total) by mouth at bedtime. 30 tablet 3   Multiple Vitamin (MULTIVITAMIN WITH MINERALS) TABS tablet Take 1 tablet by mouth daily.     omega-3 acid ethyl esters (LOVAZA ) 1 g capsule Take 2 capsules (2 g total) by mouth 2 (two) times daily. 120 capsule 1   omeprazole  (PRILOSEC ) 20 MG capsule Take 20 mg by mouth daily before breakfast.     Probiotic Product (PROBIOTIC DAILY PO) Take 1 capsule by mouth with breakfast, with lunch, and with evening meal.     sacubitril -valsartan  (ENTRESTO ) 97-103 MG Take 1 tablet by mouth 2 (two) times daily. 60 tablet 11   SILDENAFIL CITRATE PO Take 45-90 mg by mouth daily as needed (ED). Take 1 - 2 tablets 30 to 45 min before sexual activity as needed     spironolactone  (ALDACTONE ) 50 MG tablet Take 1 tablet  (50 mg total) by mouth daily. 30 tablet 6   warfarin (COUMADIN ) 5 MG tablet Take 1.5 tablets to 2 tablets by mouth once daily as directed by Anticoagulation clinic 60 tablet 5   No current facility-administered medications for this encounter.   No Known Allergies  Social History   Socioeconomic History   Marital status: Married    Spouse name: Not on file   Number of children: 0   Years of education: Not on file   Highest education level: Not on file  Occupational History   Occupation: Clinical research associate   Occupation: Attorney/Writer  Tobacco Use   Smoking status: Former    Current packs/day: 0.00    Types: Cigarettes    Quit date: 02/15/2022    Years since quitting: 1.5   Smokeless tobacco: Never   Tobacco comments:    x15 yrs as of 2021  Vaping Use   Vaping status: Never Used  Substance and Sexual Activity   Alcohol use: Not Currently    Comment: a couple of drinks on the weekends   Drug use: Never   Sexual activity: Yes    Birth control/protection: None  Other Topics Concern   Not on file  Social History Narrative   2 adopted children   Social Drivers of Health   Financial Resource Strain: Not on file  Food Insecurity: No Food Insecurity (02/20/2022)   Hunger Vital Sign    Worried About Running Out of Food in the Last Year: Never true    Ran Out of Food in the Last Year: Never true  Transportation Needs: No Transportation Needs (02/20/2022)   PRAPARE - Administrator, Civil Service (Medical): No    Lack of Transportation (Non-Medical): No  Physical Activity: Not on file  Stress: Not on file  Social Connections: Not on file  Intimate Partner Violence: Not At Risk (02/20/2022)   Humiliation, Afraid, Rape, and Kick questionnaire    Fear of Current or Ex-Partner: No    Emotionally Abused: No    Physically Abused: No    Sexually Abused: No   Family History  Problem Relation Age of Onset   Cancer Mother        peritoneal   Arrhythmia Father    Prostate cancer  Father    Colon cancer Brother 25   Cancer Maternal Grandmother        type unknown   Heart attack Maternal Grandfather  Diabetes Paternal Grandmother    Heart attack Paternal Grandfather    BP 110/74   Pulse 80   Ht 5' 9 (1.753 m)   Wt 92.5 kg (204 lb)   SpO2 96%   BMI 30.13 kg/m   Wt Readings from Last 3 Encounters:  08/18/23 92.5 kg (204 lb)  06/18/23 90.5 kg (199 lb 9.6 oz)  12/22/22 89 kg (196 lb 3.4 oz)   PHYSICAL EXAM: General:  NAD. No resp difficulty, walked into clinic HEENT: Normal Neck: Supple. No JVD. Cor: Regular rate & rhythm. No rubs, gallops or murmurs. + mechanical S2 Lungs: Clear Abdomen: Soft, nontender, nondistended.  Extremities: No cyanosis, clubbing, rash, edema Neuro: Alert & oriented x 3, moves all 4 extremities w/o difficulty. Affect pleasant.  ASSESSMENT & PLAN: 1. CAD: Admission 8/21 for acute anterior MI, delayed presentation with no ischemic pain, had pleuritic-type chest pain (post-MI pericarditis). LHC with totally occluded mid LAD w/o collaterals. Suspected completed MI, no intervention.  Repeat LHC in 11/23 with mLAD 100% stenosed, pRCA 50% stenosed.  No chest pain.  - Continue ASA 81 daily.   - Continue atorvastatin  80 mg daily and Zetia  10 daily, check lipids today.   2. Chronic systolic CHF: Ischemic cardiomyopathy.  Reduced EF post anterior MI.  Echo in 9/21 with EF 25-30%, peri-apical akinesis, moderate AS. Cath w/ occluded mLAD but no intervention as he had completed his infarct. He has [employer] ICD.  Echo in 8/22 with EF 30%.  Echo in 12/22 with EF 30-35%, normal RV, moderate AS. Echo 10/23 showed EF remains 25-30% with peri-apical akinesis and a chronic-appearing LV thrombus, severe low flow/low gradient aortic stenosis (mean gradient 27 mmHg with AVA 0.88 cm^2), bicuspid aortic valve, normal RV, normal IVC.  Now s/p mechanical AVR.  TEE 1/24 EF 20-30%, normal RV.  Echo 10/24 showed EF 35-40% with aneurysmal apex, mild LVH,  normal RV, mechanical aortic valve functioning normally. NYHA class I. He is not volume overloaded by exam or device interrogation.  - Continue Lasix  40 mg daily.  BMET today. - Continue Entresto  97/103 bid. - Continue spironolactone  50 mg daily.  - Continue Coreg  6.25 mg bid.  - Continue dapagliflozin  10 mg daily.  - Continue digoxin  0.0625, digoxin  level < 0.4 on 06/18/23 - Would like him to get barostimulation activation therapy device, but Medicaid will not cover.   - Update echo next visit. 3. 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   4. LV thrombus: Large LV thrombus noted on echo in 8/21, resolved on 9/21 echo.  Thrombus had recurred on 8/23 echo and warfarin was increased to INR goal 2.5-3.5 and ASA 81 was added.  Echo 10/23, there was still an LV thrombus, though it was smaller than in 8/23. Not mobile.  - Continue warfarin with INR goal 2.5-3.5 and continue ASA 81 daily.  5. Tobacco use: He is back smoking 1/2 ppd. Had success in the past with Chantix . - Discussed cessation. 6. Hyperlipidemia: LDL goal < 55. Check lipids today. - Continue atorvastatin  80 mg daily. - Continue Zetia  10 mg daily. - Continue fenofibrate  160 mg daily. - Continue Lovaza  2 g bid. 7. Aortic stenosis: Bicuspid aortic valve with moderate AS on 12/22 echo. Echo in 10/23 showed severe low flow/low gradient aortic stenosis with mean gradient 27 mmHg and AVA 0.88 cm^2.  He had SAVR 02/19/22 with Dr. Maryjane with St Jude mechanical aortic valve.  - Continue ASA 81 + warfarin (INR goal 2.5-3.5).  8. Alcohol abuse: He  has quit. Congratulated. 9. Obesity: Body mass index is 30.13 kg/m. - We discussed GLP1 and he is interested. I think this would be a good option for him. Refer to PharmD to discuss. -  Check A1C today.  Follow up in 3 months with Dr. Rolan + echo  Harlene HERO [place], [place]  08/18/2023

## 2023-08-17 NOTE — Telephone Encounter (Signed)
 Called to confirm/remind patient of their appointment at the [facility] on 08/18/23.       Appointment:              [x] Confirmed             [] Left mess              [] No answer/No voice mail             [] Phone not in service   Patient reminded to bring all medications and/or complete list.   Confirmed patient has transportation. Gave directions, instructed to utilize valet parking.

## 2023-08-18 ENCOUNTER — Other Ambulatory Visit: Payer: Self-pay

## 2023-08-18 ENCOUNTER — Encounter (HOSPITAL_COMMUNITY): Payer: Self-pay

## 2023-08-18 ENCOUNTER — Ambulatory Visit (HOSPITAL_COMMUNITY)
Admission: RE | Admit: 2023-08-18 | Discharge: 2023-08-18 | Disposition: A | Discharge status: Home / Self Care | Source: Ambulatory Visit | Attending: Family Medicine | Admitting: Family Medicine

## 2023-08-18 VITALS — BP 110/74 | HR 80 | Ht 69.0 in | Wt 204.0 lb

## 2023-08-18 DIAGNOSIS — I252 Old myocardial infarction: Secondary | ICD-10-CM | POA: Insufficient documentation

## 2023-08-18 DIAGNOSIS — F1011 Alcohol abuse, in remission: Secondary | ICD-10-CM | POA: Diagnosis not present

## 2023-08-18 DIAGNOSIS — E66811 Obesity, class 1: Secondary | ICD-10-CM

## 2023-08-18 DIAGNOSIS — Z8249 Family history of ischemic heart disease and other diseases of the circulatory system: Secondary | ICD-10-CM | POA: Insufficient documentation

## 2023-08-18 DIAGNOSIS — I2510 Atherosclerotic heart disease of native coronary artery without angina pectoris: Secondary | ICD-10-CM | POA: Diagnosis present

## 2023-08-18 DIAGNOSIS — Z7982 Long term (current) use of aspirin: Secondary | ICD-10-CM | POA: Insufficient documentation

## 2023-08-18 DIAGNOSIS — I513 Intracardiac thrombosis, not elsewhere classified: Secondary | ICD-10-CM | POA: Diagnosis not present

## 2023-08-18 DIAGNOSIS — Z7901 Long term (current) use of anticoagulants: Secondary | ICD-10-CM | POA: Insufficient documentation

## 2023-08-18 DIAGNOSIS — F17210 Nicotine dependence, cigarettes, uncomplicated: Secondary | ICD-10-CM | POA: Diagnosis not present

## 2023-08-18 DIAGNOSIS — Z79899 Other long term (current) drug therapy: Secondary | ICD-10-CM | POA: Insufficient documentation

## 2023-08-18 DIAGNOSIS — E669 Obesity, unspecified: Secondary | ICD-10-CM | POA: Diagnosis not present

## 2023-08-18 DIAGNOSIS — I255 Ischemic cardiomyopathy: Secondary | ICD-10-CM | POA: Diagnosis not present

## 2023-08-18 DIAGNOSIS — E785 Hyperlipidemia, unspecified: Secondary | ICD-10-CM | POA: Diagnosis not present

## 2023-08-18 DIAGNOSIS — I517 Cardiomegaly: Secondary | ICD-10-CM | POA: Diagnosis not present

## 2023-08-18 DIAGNOSIS — Z6830 Body mass index (BMI) 30.0-30.9, adult: Secondary | ICD-10-CM | POA: Diagnosis not present

## 2023-08-18 DIAGNOSIS — Z952 Presence of prosthetic heart valve: Secondary | ICD-10-CM | POA: Diagnosis not present

## 2023-08-18 DIAGNOSIS — E782 Mixed hyperlipidemia: Secondary | ICD-10-CM

## 2023-08-18 DIAGNOSIS — Z716 Tobacco abuse counseling: Secondary | ICD-10-CM | POA: Insufficient documentation

## 2023-08-18 DIAGNOSIS — I350 Nonrheumatic aortic (valve) stenosis: Secondary | ICD-10-CM | POA: Diagnosis not present

## 2023-08-18 DIAGNOSIS — I5022 Chronic systolic (congestive) heart failure: Secondary | ICD-10-CM | POA: Insufficient documentation

## 2023-08-18 DIAGNOSIS — Z95810 Presence of automatic (implantable) cardiac defibrillator: Secondary | ICD-10-CM | POA: Diagnosis not present

## 2023-08-18 DIAGNOSIS — Z720 Tobacco use: Secondary | ICD-10-CM

## 2023-08-18 LAB — LIPID PANEL
Cholesterol: 136 mg/dL (ref 0–200)
HDL: 38 mg/dL — ABNORMAL LOW (ref >40)
LDL Cholesterol: 58 mg/dL (ref 0–99)
Total CHOL/HDL Ratio: 3.6 ratio
Triglycerides: 199 mg/dL — ABNORMAL HIGH (ref <150)
VLDL: 40 mg/dL (ref 0–40)

## 2023-08-18 LAB — BASIC METABOLIC PANEL WITH GFR
Anion gap: 13 (ref 5–15)
BUN: 27 mg/dL — ABNORMAL HIGH (ref 6–20)
CO2: 27 mmol/L (ref 22–32)
Calcium: 10.3 mg/dL (ref 8.9–10.3)
Chloride: 99 mmol/L (ref 98–111)
Creatinine, Ser: 1.48 mg/dL — ABNORMAL HIGH (ref 0.61–1.24)
GFR, Estimated: 55 mL/min — ABNORMAL LOW (ref >60)
Glucose, Bld: 110 mg/dL — ABNORMAL HIGH (ref 70–99)
Potassium: 4.6 mmol/L (ref 3.5–5.1)
Sodium: 139 mmol/L (ref 135–145)

## 2023-08-18 LAB — HEMOGLOBIN A1C
Hgb A1c MFr Bld: 5.6 % (ref 4.8–5.6)
Mean Plasma Glucose: 114.02 mg/dL

## 2023-08-18 MED ORDER — REXULTI 0.5 MG PO TABS: 0.5000 mg | ORAL_TABLET | Freq: Every day | ORAL | Status: DC

## 2023-08-18 MED ORDER — REXULTI 0.5 MG PO TABS
1.0000 mg | ORAL_TABLET | Freq: Every day | ORAL | 0 refills | Status: DC
  Filled 2023-08-18: qty 180, 90d supply, fill #0

## 2023-08-18 NOTE — Addendum Note (Signed)
 Encounter addended by: Malynda Smolinski B, RN on: 08/18/2023 4:17 PM  Actions taken: Order list changed, Diagnosis association updated

## 2023-08-18 NOTE — Patient Instructions (Addendum)
 Good to see you today!  Labs done today, your results will be available in MyChart, we will contact you for abnormal readings.  You have been referred to pharmacy they will call to schedule an appointment  Your physician has requested that you have an echocardiogram. Echocardiography is a painless test that uses sound waves to create images of your heart. It provides your doctor with information about the size and shape of your heart and how well your heart's chambers and valves are working. This procedure takes approximately one hour. There are no restrictions for this procedure. Please do NOT wear cologne, perfume, aftershave, or lotions (deodorant is allowed). Please arrive 15 minutes prior to your appointment time.  Please note: We ask at that you not bring children with you during ultrasound (echo/ vascular) testing. Due to room size and safety concerns, children are not allowed in the ultrasound rooms during exams. Our front office staff cannot provide observation of children in our lobby area while testing is being conducted. An adult accompanying a patient to their appointment will only be allowed in the ultrasound room at the discretion of the ultrasound technician under special circumstances. We apologize for any inconvenience.  Your physician recommends that you schedule a follow-up appointment  3 months with echocardiogram(October) Call office in August to schedule an appointment  If you have any questions or concerns before your next appointment please send us  a message through [place] or call our office at [phone].    TO LEAVE A MESSAGE FOR THE NURSE SELECT OPTION 2, PLEASE LEAVE A MESSAGE INCLUDING: YOUR NAME DATE OF BIRTH CALL BACK NUMBER REASON FOR CALL**this is important as we prioritize the call backs  YOU WILL RECEIVE A CALL BACK THE SAME DAY AS LONG AS YOU CALL BEFORE 4:00 PM At the [facility], you and your health needs are our priority. As part of  our continuing mission to provide you with exceptional heart care, we have created designated Provider Care Teams. These Care Teams include your primary Cardiologist (physician) and Advanced Practice Providers (APPs- Physician Assistants and Nurse Practitioners) who all work together to provide you with the care you need, when you need it.   You may see any of the following providers on your designated Care Team at your next follow up: Dr Toribio Fuel Dr Ezra Shuck Dr. Ria Commander Dr. Morene Brownie Amy Lenetta, NP Caffie Shed, [place] [facility] [place], [place] Beckey Coe, NP [place] Lee, NP Ellouise Class, NP Tinnie Redman, PharmD Jaun Bash, PharmD   Please be sure to bring in all your medications bottles to every appointment.    Thank you for choosing  [facility]

## 2023-08-19 ENCOUNTER — Ambulatory Visit (HOSPITAL_COMMUNITY): Payer: Self-pay | Admitting: Family Medicine

## 2023-08-19 ENCOUNTER — Other Ambulatory Visit: Payer: Self-pay

## 2023-08-19 DIAGNOSIS — I5022 Chronic systolic (congestive) heart failure: Secondary | ICD-10-CM

## 2023-08-24 ENCOUNTER — Other Ambulatory Visit: Payer: Self-pay

## 2023-08-27 ENCOUNTER — Other Ambulatory Visit (HOSPITAL_COMMUNITY): Payer: Self-pay | Admitting: Cardiovascular Disease

## 2023-08-27 DIAGNOSIS — I513 Intracardiac thrombosis, not elsewhere classified: Secondary | ICD-10-CM

## 2023-08-28 ENCOUNTER — Other Ambulatory Visit: Payer: Self-pay

## 2023-08-28 MED ORDER — WARFARIN SODIUM 5 MG PO TABS
7.5000 mg | ORAL_TABLET | Freq: Every day | ORAL | 3 refills | Status: DC
  Filled 2023-08-28: qty 55, 27d supply, fill #0
  Filled 2023-09-26: qty 55, 27d supply, fill #1
  Filled 2023-10-25: qty 55, 27d supply, fill #2
  Filled 2023-11-28: qty 55, 27d supply, fill #3

## 2023-08-28 NOTE — Telephone Encounter (Signed)
 Warfarin 5mg  refill Apical mural thrombus  Last INR-08/10/23 Last OV-08/18/23

## 2023-08-31 ENCOUNTER — Ambulatory Visit (HOSPITAL_COMMUNITY): Payer: Self-pay | Admitting: Family Medicine

## 2023-08-31 ENCOUNTER — Ambulatory Visit (HOSPITAL_COMMUNITY)
Admission: RE | Admit: 2023-08-31 | Discharge: 2023-08-31 | Disposition: A | Discharge status: Home / Self Care | Source: Ambulatory Visit | Attending: Cardiology

## 2023-08-31 DIAGNOSIS — I5022 Chronic systolic (congestive) heart failure: Secondary | ICD-10-CM | POA: Insufficient documentation

## 2023-08-31 LAB — BASIC METABOLIC PANEL WITH GFR
Anion gap: 10 (ref 5–15)
BUN: 26 mg/dL — ABNORMAL HIGH (ref 6–20)
CO2: 23 mmol/L (ref 22–32)
Calcium: 9.5 mg/dL (ref 8.9–10.3)
Chloride: 104 mmol/L (ref 98–111)
Creatinine, Ser: 1.33 mg/dL — ABNORMAL HIGH (ref 0.61–1.24)
GFR, Estimated: >60 mL/min (ref >60)
Glucose, Bld: 155 mg/dL — ABNORMAL HIGH (ref 70–99)
Potassium: 3.9 mmol/L (ref 3.5–5.1)
Sodium: 137 mmol/L (ref 135–145)

## 2023-09-01 ENCOUNTER — Encounter: Payer: Self-pay | Admitting: Cardiology

## 2023-09-03 ENCOUNTER — Other Ambulatory Visit: Payer: Self-pay | Admitting: Internal Medicine

## 2023-09-03 DIAGNOSIS — F17210 Nicotine dependence, cigarettes, uncomplicated: Secondary | ICD-10-CM

## 2023-09-06 ENCOUNTER — Other Ambulatory Visit: Payer: Self-pay | Admitting: Cardiology

## 2023-09-07 ENCOUNTER — Other Ambulatory Visit: Payer: Self-pay

## 2023-09-07 MED ORDER — OMEGA-3-ACID ETHYL ESTERS 1 G PO CAPS
2.0000 g | ORAL_CAPSULE | Freq: Two times a day (BID) | ORAL | 3 refills | Status: AC
  Filled 2023-09-07: qty 360, 90d supply, fill #0
  Filled 2023-12-05: qty 360, 90d supply, fill #1

## 2023-09-10 ENCOUNTER — Other Ambulatory Visit: Payer: Self-pay

## 2023-09-10 ENCOUNTER — Telehealth (INDEPENDENT_AMBULATORY_CARE_PROVIDER_SITE_OTHER): Admitting: Family

## 2023-09-10 ENCOUNTER — Encounter (HOSPITAL_COMMUNITY): Payer: Self-pay | Admitting: Family

## 2023-09-10 DIAGNOSIS — F411 Generalized anxiety disorder: Secondary | ICD-10-CM | POA: Diagnosis not present

## 2023-09-10 DIAGNOSIS — F330 Major depressive disorder, recurrent, mild: Secondary | ICD-10-CM

## 2023-09-10 DIAGNOSIS — F331 Major depressive disorder, recurrent, moderate: Secondary | ICD-10-CM

## 2023-09-10 DIAGNOSIS — I2510 Atherosclerotic heart disease of native coronary artery without angina pectoris: Secondary | ICD-10-CM

## 2023-09-10 DIAGNOSIS — F32A Depression, unspecified: Secondary | ICD-10-CM

## 2023-09-10 MED ORDER — MIRTAZAPINE 45 MG PO TABS
45.0000 mg | ORAL_TABLET | Freq: Every day | ORAL | 3 refills | Status: DC
  Filled 2023-09-10 – 2023-09-26 (×2): qty 30, 30d supply, fill #0
  Filled 2023-10-25: qty 30, 30d supply, fill #1
  Filled 2023-11-28: qty 30, 30d supply, fill #2

## 2023-09-10 MED ORDER — REXULTI 0.5 MG PO TABS
1.0000 mg | ORAL_TABLET | Freq: Every day | ORAL | 0 refills | Status: DC
  Filled 2023-09-10 – 2023-11-14 (×2): qty 180, 90d supply, fill #0

## 2023-09-10 NOTE — Addendum Note (Signed)
 Addended by: STARKES-PERRY, Glendel Jaggers on: 09/10/2023 03:58 PM   Modules accepted: Orders

## 2023-09-10 NOTE — Progress Notes (Signed)
 [facility] MD/PA/NP OP Progress Note Virtual Visit via Video Note  I connected with Craig Flores on 09/10/23 at  4:00 PM EDT by a video enabled telemedicine application and verified that I am speaking with the correct person using two identifiers.  Location: Patient: home Provider: Clinic   I discussed the limitations of evaluation and management by telemedicine and the availability of in person appointments. The patient expressed understanding and agreed to proceed.  I provided 30 minutes of non-face-to-face time during this encounter.   09/10/2023 3:56 PM Craig Flores  MRN:  [id number]  Chief Complaint: I have been struggling and ruminating about the past  HPI:  58 year old male seen today for follow up psychiatric evaluation. He has a psychiatric history of alcohol dependence, substance induced mood disorder, anxiety, depression, and SI. He is currently being managed on  hydroxyzine  25 mg three times daily as needed, and Mirtazapine  45 mg at nightly.  Patient also takes melatonin 5 mg provides it over-the-counter.  He notes that his medications are somewhat effective in managing his psychiatric conditions.   Patient presents pleasant, cooperative, and engaged in conversation. He reports that while Rexulti  has been effective for him, he believes he may need a medication adjustment due to feeling more anxious and experiencing increased depressive symptoms. 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 He shares that he recently traveled to [place], [place] , to see his family for the summer and greatly enjoyed the visit. Sleep and appetite are adequate. He denies suicidal ideation, homicidal ideation, visual/auditory hallucinations, mania, or paranoia. He reports his cardiac health is well-managed.  Objective: Patient is alert and oriented to person, place, time, and situation. Well-groomed and appropriately dressed. Engages well and maintains appropriate eye contact. Thought process is logical  and goal-directed. Mood appears mildly depressed with congruent affect. Speech is clear, coherent, and of normal rate and volume. No abnormal movements, psychomotor agitation, or retardation noted. No perceptual disturbances observed or reported. Insight and judgment appear intact.   Visit Diagnosis:    ICD-10-CM   1. Moderate episode of recurrent major depressive disorder (HCC)  F33.1     2. Generalized anxiety disorder  F41.1            Past Psychiatric History: alcohol dependence, substance induced mood disorder, anxiety, depression, and SI.   Past Medical History:  Past Medical History:  Diagnosis Date   Acid reflux    AICD (automatic cardioverter/defibrillator) present    Anxiety    Aortic stenosis with bicuspid valve    CHF (congestive heart failure) (HCC)    Depression    Dyspnea    GERD (gastroesophageal reflux disease)    HLD (hyperlipidemia)    Ischemic cardiomyopathy    Migraines    Myocardial infarction (HCC) 08/28/2019    Past Surgical History:  Procedure Laterality Date   AORTIC VALVE REPLACEMENT N/A 02/19/2022   Procedure: AORTIC VALVE REPLACEMENT (AVR) 23mm SJM REGENT MECHANICAL AORTIC VALVE;  Surgeon: Maryjane Mt, MD;  Location: [facility] OR;  Service: Open Heart Surgery;  Laterality: N/A;   COLONOSCOPY WITH PROPOFOL  N/A 07/16/2021   Procedure: COLONOSCOPY WITH PROPOFOL ;  Surgeon: Avram Lupita BRAVO, MD;  Location: [facility];  Service: Gastroenterology;  Laterality: N/A;   CORONARY/GRAFT ACUTE MI REVASCULARIZATION N/A 08/28/2019   Procedure: Coronary/Graft Acute MI Revascularization;  Surgeon: Court Dorn PARAS, MD;  Location: [facility]AS[facility];  Service: Cardiovascular;  Laterality: N/A;   ICD IMPLANT N/A 11/28/2019   Procedure: ICD IMPLANT;  Surgeon: Fernande Elspeth BROCKS,  MD;  Location: [facility];  Service: Cardiovascular;  Laterality: N/A;   LEFT HEART CATH AND CORONARY ANGIOGRAPHY N/A 08/28/2019   Procedure: LEFT HEART CATH AND CORONARY ANGIOGRAPHY;   Surgeon: Court Dorn PARAS, MD;  [facility];  Service: Cardiovascular;  Laterality: N/A;   POLYPECTOMY  07/16/2021   Procedure: POLYPECTOMY;  Surgeon: Avram Lupita BRAVO, MD;  Location: [facility];  Service: Gastroenterology;;   RIGHT HEART CATH AND CORONARY ANGIOGRAPHY N/A 12/12/2021   Procedure: RIGHT HEART CATH AND CORONARY ANGIOGRAPHY;  Surgeon: Verlin Lonni BIRCH, MD;  [facility];  Service: Cardiovascular;  Laterality: N/A;   TEE WITHOUT CARDIOVERSION N/A 02/19/2022   Procedure: TRANSESOPHAGEAL ECHOCARDIOGRAM (TEE);  Surgeon: Maryjane Mt, MD;  Location: [facility] OR;  Service: Open Heart Surgery;  Laterality: N/A;   WISDOM TOOTH EXTRACTION      Family Psychiatric History: Notes that his mother and aunt had mental health issues. Notes he belives they had anxiety  Family History:  Family History  Problem Relation Age of Onset   Cancer Mother        peritoneal   Arrhythmia Father    Prostate cancer Father    Colon cancer Brother 39   Cancer Maternal Grandmother        type unknown   Heart attack Maternal Grandfather    Diabetes Paternal Grandmother    Heart attack Paternal Grandfather     Social History:  Social History   Socioeconomic History   Marital status: Married    Spouse name: Not on file   Number of children: 0   Years of education: Not on file   Highest education level: Not on file  Occupational History   Occupation: Clinical research associate   Occupation: Attorney/Writer  Tobacco Use   Smoking status: Former    Current packs/day: 0.00    Types: Cigarettes    Quit date: 02/15/2022    Years since quitting: 1.5   Smokeless tobacco: Never   Tobacco comments:    x15 yrs as of 2021  Vaping Use   Vaping status: Never Used  Substance and Sexual Activity   Alcohol use: Not Currently    Comment: a couple of drinks on the weekends   Drug use: Never   Sexual activity: Yes    Birth control/protection: None  Other Topics Concern   Not on file   Social History Narrative   2 adopted children   Social Drivers of Health   Financial Resource Strain: Not on file  Food Insecurity: No Food Insecurity (02/20/2022)   Hunger Vital Sign    Worried About Running Out of Food in the Last Year: Never true    Ran Out of Food in the Last Year: Never true  Transportation Needs: No Transportation Needs (02/20/2022)   PRAPARE - Administrator, Civil Service (Medical): No    Lack of Transportation (Non-Medical): No  Physical Activity: Not on file  Stress: Not on file  Social Connections: Not on file    Allergies: No Known Allergies  Metabolic Disorder Labs: Lab Results  Component Value Date   HGBA1C 5.6 08/18/2023   MPG 114.02 08/18/2023   MPG 119.76 02/17/2022   No results found for: PROLACTIN Lab Results  Component Value Date   CHOL 136 08/18/2023   TRIG 199 (H) 08/18/2023   HDL 38 (L) 08/18/2023   CHOLHDL 3.6 08/18/2023   VLDL 40 08/18/2023   LDLCALC 58 08/18/2023   LDLCALC 63 06/18/2023  Lab Results  Component Value Date   TSH 3.181 08/28/2019    Therapeutic Level Labs: No results found for: LITHIUM No results found for: VALPROATE No results found for: CBMZ  Current Medications: Current Outpatient Medications  Medication Sig Dispense Refill   acetaminophen  (TYLENOL ) 500 MG tablet Take 500 mg by mouth every 6 (six) hours as needed for headache.     aspirin  81 MG chewable tablet Chew 1 tablet (81 mg total) by mouth daily. 90 tablet 3   atorvastatin  (LIPITOR ) 80 MG tablet Take 1 tablet (80 mg total) by mouth daily. 90 tablet 3   Brexpiprazole  (REXULTI ) 0.5 MG TABS Take 2 tablets (1 mg total) by mouth daily in the afternoon. 180 tablet 0   carvedilol  (COREG ) 6.25 MG tablet Take 1 tablet (6.25 mg total) by mouth 2 (two) times daily. 60 tablet 6   dapagliflozin  propanediol (FARXIGA ) 10 MG TABS tablet Take 1 tablet (10 mg total) by mouth daily before breakfast. 30 tablet 11   digoxin  (LANOXIN ) 0.125 MG  tablet Take 1/2 tablet (0.0625 mg total) by mouth daily. 45 tablet 3   ezetimibe  (ZETIA ) 10 MG tablet Take 1 tablet (10 mg total) by mouth daily. 90 tablet 3   fenofibrate  160 MG tablet Take 1 tablet (160 mg total) by mouth daily. 30 tablet 3   furosemide  (LASIX ) 20 MG tablet Take 2 tablets (40 mg total) by mouth daily. 180 tablet 3   hydrOXYzine  (ATARAX ) 25 MG tablet Take 1 tablet (25 mg total) by mouth 3 (three) times daily as needed. 90 tablet 3   levothyroxine  (SYNTHROID ) 50 MCG tablet Take 1 tablet (50 mcg total) by mouth daily. 90 tablet 0   melatonin 5 MG TABS Take 2 tablets (10 mg total) by mouth at bedtime. 30 tablet 3   mirtazapine  (REMERON ) 45 MG tablet Take 1 tablet (45 mg total) by mouth at bedtime. 30 tablet 3   Multiple Vitamin (MULTIVITAMIN WITH MINERALS) TABS tablet Take 1 tablet by mouth daily.     omega-3 acid ethyl esters (LOVAZA ) 1 g capsule Take 2 capsules (2 g total) by mouth 2 (two) times daily. 360 capsule 3   omeprazole  (PRILOSEC ) 20 MG capsule Take 20 mg by mouth daily before breakfast.     Probiotic Product (PROBIOTIC DAILY PO) Take 1 capsule by mouth with breakfast, with lunch, and with evening meal.     sacubitril -valsartan  (ENTRESTO ) 97-103 MG Take 1 tablet by mouth 2 (two) times daily. 60 tablet 11   SILDENAFIL CITRATE PO Take 45-90 mg by mouth daily as needed (ED). Take 1 - 2 tablets 30 to 45 min before sexual activity as needed     spironolactone  (ALDACTONE ) 50 MG tablet Take 1 tablet (50 mg total) by mouth daily. 30 tablet 6   warfarin (COUMADIN ) 5 MG tablet Take 1.5 tablets to 2 tablets by mouth once daily as directed by Anticoagulation clinic 55 tablet 3   No current facility-administered medications for this visit.     Musculoskeletal: Strength & Muscle Tone: Normal, telehealth visit Gait & Station: normal, telehealth visit Patient leans: N/A  Psychiatric Specialty Exam: Review of Systems  All other systems reviewed and are negative.   There were no  vitals taken for this visit.There is no height or weight on file to calculate BMI.  General Appearance: Well Groomed  Eye Contact:  Good  Speech:  Clear and Coherent and Normal Rate  Volume:  Normal  Mood:  Anxious and Depressed  Affect:  Appropriate  and Congruent  Thought Process:  Coherent, Goal Directed and Linear  Orientation:  Full (Time, Place, and Person)  Thought Content: WDL and Logical   Suicidal Thoughts:  Yes.  without intent/plan  Homicidal Thoughts:  No  Memory:  Immediate;   Good Recent;   Good Remote;   Good  Judgement:  Good  Insight:  Good  Psychomotor Activity:  Normal  Concentration:  Concentration: Good and Attention Span: Good  Recall:  Good  Fund of Knowledge: Good  Language: Good  Akathisia:  No  Handed:  Right  AIMS (if indicated): Not done  Assets:  Communication Skills Desire for Improvement Financial Resources/Insurance Housing Intimacy Social Support  ADL's:  Intact  Cognition: WNL  Sleep:  Good   Screenings: AIMS    Flowsheet Row [facility]  AIMS Total Score 0   AUDIT    Flowsheet Row Video Visit from 05/20/2023 in [facility] Admission (Discharged[facility]  Alcohol Use Disorder Identification Test Final Score (AUDIT) 10 19   GAD-7    Flowsheet Row Video Visit from 05/20/2023 in [facility] Video Visit from 02/26/2023 in [facility] Video Visit from 12/17/2022 in [facility] Video Visit from 11/14/2022 in [facility] Video Visit from 08/15/2022 in [facility]  Total GAD-7 Score 13 10 6 10 9    PHQ2-9    Flowsheet Row Video Visit from 05/20/2023 in [facility] Video Visit from 02/26/2023 in [facility] Video Visit from 12/17/2022 in [facility] Video Visit from 11/14/2022 in [facility] CARDIAC REHAB PHASE II ORIENTATION from 10/09/2022 in [facility]  PHQ-2 Total Score 4 3 1 3 2   PHQ-9 Total Score 16 9 5 9 7    Flowsheet Row Video Visit from 05/20/2023 in [facility] Video Visit from 06/06/2022 in [facility] Admission (Discharged) from 02/19/2022 in [facility]E CV SURGICAL PROGRESSIVE CARE  C-SSRS RISK CATEGORY Error: Q7 should not be populated when Q6 is No Low Risk No Risk     Assessment and Plan: Patient notes that he has been more anxious and depressed.  3 weeks ago he relapsed on alcohol.  1. Generalized anxiety disorder  Continue- hydrOXYzine  (ATARAX ) 25 MG tablet; Take 1 tablet (25 mg total) by mouth 3 (three) times daily as needed.  Dispense: 90 tablet; Refill: 3 Continue- mirtazapine  (REMERON ) 45 MG tablet; Take 1 tablet (45 mg total) by mouth at bedtime.  Dispense: 30 tablet; Refill: 3  2. Mild depression  Start- Brexpiprazole  (REXULTI ) 0.5 MG TABS; Take 1-2 tablets (0.5-1 mg total) by mouth daily in the afternoon. Take one tablete 0.5 mg for a week and then increase to 2 tablets 1 mg on week 2.  Dispense: 60 tablet; Refill: 3 Continue- mirtazapine  (REMERON ) 45 MG tablet; Take 1 tablet (45 mg total) by mouth at bedtime.  Dispense: 30 tablet; Refill: 3 Continue- melatonin 5 MG TABS; Take 2 tablets (10 mg total) by mouth at bedtime.  Dispense: 30 tablet; Refill: 3  Follow up in 2.5 months Follow up with therapy  Majel GORMAN Ramp, FNP 09/10/2023, 3:56 PM

## 2023-09-12 ENCOUNTER — Other Ambulatory Visit (HOSPITAL_COMMUNITY): Payer: Self-pay | Admitting: Cardiology

## 2023-09-14 ENCOUNTER — Other Ambulatory Visit: Payer: Self-pay

## 2023-09-14 MED ORDER — EZETIMIBE 10 MG PO TABS
10.0000 mg | ORAL_TABLET | Freq: Every day | ORAL | 3 refills | Status: AC
  Filled 2023-09-14: qty 90, 90d supply, fill #0
  Filled 2023-12-12: qty 90, 90d supply, fill #1

## 2023-09-15 ENCOUNTER — Encounter: Payer: Self-pay | Admitting: Internal Medicine

## 2023-09-17 ENCOUNTER — Ambulatory Visit
Admission: RE | Admit: 2023-09-17 | Discharge: 2023-09-17 | Disposition: A | Discharge status: Home / Self Care | Source: Ambulatory Visit | Attending: Internal Medicine | Admitting: Internal Medicine

## 2023-09-17 DIAGNOSIS — F17210 Nicotine dependence, cigarettes, uncomplicated: Secondary | ICD-10-CM

## 2023-09-21 ENCOUNTER — Other Ambulatory Visit: Payer: Self-pay

## 2023-09-22 ENCOUNTER — Ambulatory Visit: Attending: Cardiology

## 2023-09-22 ENCOUNTER — Telehealth: Payer: Self-pay | Admitting: *Deleted

## 2023-09-22 NOTE — Telephone Encounter (Signed)
 Called patient since he missed his appointment today, there was no answer so left him a message to call back to reschedule for first available.

## 2023-09-24 ENCOUNTER — Ambulatory Visit

## 2023-09-24 DIAGNOSIS — I5022 Chronic systolic (congestive) heart failure: Secondary | ICD-10-CM

## 2023-09-26 ENCOUNTER — Other Ambulatory Visit: Payer: Self-pay

## 2023-09-27 ENCOUNTER — Other Ambulatory Visit: Payer: Self-pay

## 2023-10-01 ENCOUNTER — Ambulatory Visit

## 2023-10-01 DIAGNOSIS — I5022 Chronic systolic (congestive) heart failure: Secondary | ICD-10-CM

## 2023-10-02 LAB — CUP PACEART REMOTE DEVICE CHECK
Battery Remaining Longevity: 156 mo
Battery Remaining Percentage: 100 %
Brady Statistic RV Percent Paced: 0 %
Date Time Interrogation Session: 20250904025800
HighPow Impedance: 80 Ohm
Implantable Lead Connection Status: 753985
Implantable Lead Implant Date: 20211101
Implantable Lead Location: 753860
Implantable Lead Model: 138
Implantable Lead Serial Number: 303500
Implantable Pulse Generator Implant Date: 20211101
Lead Channel Impedance Value: 500 Ohm
Lead Channel Setting Pacing Amplitude: 2.5 V
Lead Channel Setting Pacing Pulse Width: 0.4 ms
Lead Channel Setting Sensing Sensitivity: 0.5 mV
Pulse Gen Serial Number: 209902

## 2023-10-02 NOTE — Progress Notes (Signed)
Remote ICD Transmission.

## 2023-10-04 ENCOUNTER — Ambulatory Visit: Payer: Self-pay | Admitting: Cardiology

## 2023-10-06 ENCOUNTER — Other Ambulatory Visit: Payer: Self-pay

## 2023-10-07 ENCOUNTER — Telehealth: Payer: Self-pay | Admitting: Pharmacist

## 2023-10-07 ENCOUNTER — Telehealth: Payer: Self-pay | Admitting: Pharmacy Technician

## 2023-10-07 ENCOUNTER — Encounter: Payer: Self-pay | Admitting: Pharmacist

## 2023-10-07 ENCOUNTER — Other Ambulatory Visit (HOSPITAL_COMMUNITY): Payer: Self-pay

## 2023-10-07 ENCOUNTER — Ambulatory Visit: Attending: Cardiovascular Disease | Admitting: Pharmacist

## 2023-10-07 VITALS — Ht 69.0 in | Wt 203.2 lb

## 2023-10-07 DIAGNOSIS — E669 Obesity, unspecified: Secondary | ICD-10-CM | POA: Diagnosis present

## 2023-10-07 NOTE — Progress Notes (Signed)
 Patient ID: Craig Flores                 DOB: 01-15-1966                    MRN: [id number]     HPI: Craig Flores is a 59 y.o. male patient referred to pharmacy clinic by Harlene Glena BRISTLE to initiate GLP1-RA therapy. PMH is significant for tobacco abuse, CAD, hx of STEMI,ICM, alcohol dependence, GAD, MDD, HLD,ICD in place and obesity. Most recent BMI 30.01 kg/m .  Baseline weight and BMI: 203 lbs 30.1 kg/m  Current weight and BMI: 203 lbs 30.1 kg/m  Current meds that affect weight: none  Goal weight - 190 lbs      Diet: breakfast: eggs, whole grain toast with some fruits  Lunch: skips  Dinner: 3 times per week raw vegetables with grilled chicken  Rest of the week pasta, red meat, rice with Chicken  Snack: popcorn, peanuts, pretzels  Drink: flavored water , 6 cups of coffee with little bit of 2% milk  Eats out - once a week pizza ( 2 slices) or burrito or [place] food, eats [place] food once a week   Exercise: gym 3-4 times 45 cardio, weights 15 min  Lawn care   Family History:  Grandfather - MI at age early 89 and the other grandfather died from MI in his 45's  Social History:  Smoking: 1 pack per day  Alcohol: none   Labs: Lab Results  Component Value Date   HGBA1C 5.6 08/18/2023    Wt Readings from Last 1 Encounters:  10/07/23 203 lb 3.2 oz (92.2 kg)    BP Readings from Last 1 Encounters:  08/18/23 110/74   Pulse Readings from Last 1 Encounters:  08/18/23 80       Component Value Date/Time   CHOL 136 08/18/2023 1602   TRIG 199 (H) 08/18/2023 1602   HDL 38 (L) 08/18/2023 1602   CHOLHDL 3.6 08/18/2023 1602   VLDL 40 08/18/2023 1602   LDLCALC 58 08/18/2023 1602   LDLDIRECT 90.2 09/02/2019 0537    Past Medical History:  Diagnosis Date   Acid reflux    AICD (automatic cardioverter/defibrillator) present    Anxiety    Aortic stenosis with bicuspid valve    CHF (congestive heart failure) (HCC)    Depression    Dyspnea    GERD  (gastroesophageal reflux disease)    HLD (hyperlipidemia)    Ischemic cardiomyopathy    Migraines    Myocardial infarction (HCC) 08/28/2019    Current Outpatient Medications on File Prior to Visit  Medication Sig Dispense Refill   acetaminophen  (TYLENOL ) 500 MG tablet Take 500 mg by mouth every 6 (six) hours as needed for headache.     aspirin  81 MG chewable tablet Chew 1 tablet (81 mg total) by mouth daily. 90 tablet 3   atorvastatin  (LIPITOR ) 80 MG tablet Take 1 tablet (80 mg total) by mouth daily. 90 tablet 3   Brexpiprazole  (REXULTI ) 0.5 MG TABS Take 2 tablets (1 mg total) by mouth daily in the afternoon. 180 tablet 0   carvedilol  (COREG ) 6.25 MG tablet Take 1 tablet (6.25 mg total) by mouth 2 (two) times daily. 60 tablet 6   dapagliflozin  propanediol (FARXIGA ) 10 MG TABS tablet Take 1 tablet (10 mg total) by mouth daily before breakfast. 30 tablet 11   digoxin  (LANOXIN ) 0.125 MG tablet Take 1/2 tablet (0.0625 mg total) by mouth daily. 45 tablet 3  ezetimibe  (ZETIA ) 10 MG tablet Take 1 tablet (10 mg total) by mouth daily. 90 tablet 3   fenofibrate  160 MG tablet Take 1 tablet (160 mg total) by mouth daily. 30 tablet 3   furosemide  (LASIX ) 20 MG tablet Take 2 tablets (40 mg total) by mouth daily. 180 tablet 3   hydrOXYzine  (ATARAX ) 25 MG tablet Take 1 tablet (25 mg total) by mouth 3 (three) times daily as needed. 90 tablet 3   levothyroxine  (SYNTHROID ) 50 MCG tablet Take 1 tablet (50 mcg total) by mouth daily. 90 tablet 0   melatonin 5 MG TABS Take 2 tablets (10 mg total) by mouth at bedtime. 30 tablet 3   mirtazapine  (REMERON ) 45 MG tablet Take 1 tablet (45 mg total) by mouth at bedtime. 30 tablet 3   Multiple Vitamin (MULTIVITAMIN WITH MINERALS) TABS tablet Take 1 tablet by mouth daily.     omega-3 acid ethyl esters (LOVAZA ) 1 g capsule Take 2 capsules (2 g total) by mouth 2 (two) times daily. 360 capsule 3   omeprazole  (PRILOSEC ) 20 MG capsule Take 20 mg by mouth daily before  breakfast.     Probiotic Product (PROBIOTIC DAILY PO) Take 1 capsule by mouth with breakfast, with lunch, and with evening meal.     sacubitril -valsartan  (ENTRESTO ) 97-103 MG Take 1 tablet by mouth 2 (two) times daily. 60 tablet 11   SILDENAFIL CITRATE PO Take 45-90 mg by mouth daily as needed (ED). Take 1 - 2 tablets 30 to 45 min before sexual activity as needed     spironolactone  (ALDACTONE ) 50 MG tablet Take 1 tablet (50 mg total) by mouth daily. 30 tablet 6   warfarin (COUMADIN ) 5 MG tablet Take 1.5 tablets to 2 tablets by mouth once daily as directed by Anticoagulation clinic 55 tablet 3   [DISCONTINUED] MITIGARE  0.6 MG CAPS Take 0.6 mg by mouth daily. 30 capsule 2   No current facility-administered medications on file prior to visit.    No Known Allergies   Assessment/Plan:  1. Weight loss -   Obesity (BMI 30-39.9) Patient has not met goal of at least 5% of body weight loss with comprehensive lifestyle modifications alone in the past 3-6 months. Pharmacotherapy is appropriate to pursue as augmentation. Will start coverage assessment for [facility] on Oct 1st as medicaid cancel all PA on [address] and starting from Oct 1 all weight loss GLP1 PA need to be revised and will only be covered for approve indications . Confirmed patient has no personal or family history of medullary thyroid carcinoma (MTC) or Multiple Endocrine Neoplasia syndrome type 2 (MEN 2) or  no personal hx of pancreatitis or gall stone issues. Injection technique reviewed at today's visit.  Advised patient on common side effects including nausea, diarrhea, dyspepsia, decreased appetite, and fatigue. Counseled patient on reducing meal size and how to titrate medication to minimize side effects. Counseled patient to call if intolerable side effects or if experiencing dehydration, abdominal pain, or dizziness. Along with pharmacotherapy, the patient will follow dietary modifications and aim for at least 150 minutes of  moderate-intensity exercise per week, plus resistance training twice a week (as recommended by the American Heart Association). This resistance training--such as weightlifting, bodyweight exercises, or using resistance bands, adapted to the patient's ability--will help prevent muscle loss.  Follow up in 4 weeks regarding coverage of Wegovy . If therapy is initiated, phone follow-ups will be conducted every 4 weeks for dose titration until the patient reaches the effective therapeutic dose  and target weight.    Robbi Blanch, Pharm.D [place] Elspeth BIRCH. [facility] [address], [place], [place] [address] Phone: [phone]; Fax: [phone]

## 2023-10-07 NOTE — Telephone Encounter (Signed)
 PER PT CALLS   Pharmacy Patient Advocate Encounter   Received notification from Pt Calls Messages that prior authorization for wegovy is required/requested.   Insurance verification completed.   The patient is insured through [employer] .   Per test claim: PA required; PA started via CoverMyMeds. KEY [id number] . Please see clinical question(s) below that I am not finding the answer to in their chart and advise.   Last weight was 08/18/23, needs to be done in the last 45 days per insurance. Can we get a updated weight documented please?  (Sent to vaishali in another encounter)

## 2023-10-07 NOTE — Assessment & Plan Note (Signed)
 Patient has not met goal of at least 5% of body weight loss with comprehensive lifestyle modifications alone in the past 3-6 months. Pharmacotherapy is appropriate to pursue as augmentation. Will start coverage assessment for [facility] on Oct 1st as medicaid cancel all PA on [address] and starting from Oct 1 all weight loss GLP1 PA need to be revised and will only be covered for approve indications . Confirmed patient has no personal or family history of medullary thyroid carcinoma (MTC) or Multiple Endocrine Neoplasia syndrome type 2 (MEN 2) or  no personal hx of pancreatitis or gall stone issues. Injection technique reviewed at today's visit.  Advised patient on common side effects including nausea, diarrhea, dyspepsia, decreased appetite, and fatigue. Counseled patient on reducing meal size and how to titrate medication to minimize side effects. Counseled patient to call if intolerable side effects or if experiencing dehydration, abdominal pain, or dizziness. Along with pharmacotherapy, the patient will follow dietary modifications and aim for at least 150 minutes of moderate-intensity exercise per week, plus resistance training twice a week (as recommended by the American Heart Association). This resistance training--such as weightlifting, bodyweight exercises, or using resistance bands, adapted to the patient's ability--will help prevent muscle loss.  Follow up in 4 weeks regarding coverage of Wegovy . If therapy is initiated, phone follow-ups will be conducted every 4 weeks for dose titration until the patient reaches the effective therapeutic dose and target weight.

## 2023-10-08 ENCOUNTER — Telehealth (HOSPITAL_COMMUNITY): Admitting: Psychiatry

## 2023-10-08 NOTE — Telephone Encounter (Signed)
 Waiting until 10/28/23

## 2023-10-13 ENCOUNTER — Ambulatory Visit: Attending: Cardiology | Admitting: *Deleted

## 2023-10-13 DIAGNOSIS — Z5181 Encounter for therapeutic drug level monitoring: Secondary | ICD-10-CM | POA: Diagnosis present

## 2023-10-13 DIAGNOSIS — I513 Intracardiac thrombosis, not elsewhere classified: Secondary | ICD-10-CM | POA: Insufficient documentation

## 2023-10-13 DIAGNOSIS — Z7901 Long term (current) use of anticoagulants: Secondary | ICD-10-CM | POA: Diagnosis present

## 2023-10-13 LAB — POCT INR: INR: 5 — AB (ref 2.0–3.0)

## 2023-10-13 NOTE — Patient Instructions (Addendum)
 Description   INR-5.0; Do not take any warfarin today and tomorrow take 1 tablet of warfarin then continue taking 2 tablets daily except 1.5 tablets on Thursdays and Saturdays.   Stay consistent with greens and protein drink each week.  Eat greens tonight Recheck INR 2 weeks (normally 6 weeks).  Coumadin  Clinic [phone] BILL [id number]

## 2023-10-13 NOTE — Progress Notes (Signed)
 Description   INR-5.0; Do not take any warfarin today and tomorrow take 1 tablet of warfarin then continue taking 2 tablets daily except 1.5 tablets on Thursdays and Saturdays.   Stay consistent with greens and protein drink each week.  Eat greens tonight Recheck INR 2 weeks (normally 6 weeks).  Coumadin  Clinic [phone] BILL [id number]

## 2023-10-15 NOTE — Progress Notes (Signed)
Remote ICD Transmission.

## 2023-10-16 ENCOUNTER — Other Ambulatory Visit: Payer: Self-pay

## 2023-10-16 ENCOUNTER — Other Ambulatory Visit (HOSPITAL_COMMUNITY): Payer: Self-pay | Admitting: Cardiology

## 2023-10-16 MED ORDER — FENOFIBRATE 160 MG PO TABS
160.0000 mg | ORAL_TABLET | Freq: Every day | ORAL | 3 refills | Status: DC
  Filled 2023-10-16: qty 30, 30d supply, fill #0
  Filled 2023-11-14: qty 30, 30d supply, fill #1
  Filled 2023-12-12: qty 30, 30d supply, fill #2
  Filled 2024-01-09: qty 30, 30d supply, fill #3

## 2023-10-19 ENCOUNTER — Other Ambulatory Visit: Payer: Self-pay

## 2023-10-23 NOTE — Telephone Encounter (Signed)
 See other encounter for more info [facility]

## 2023-10-26 ENCOUNTER — Other Ambulatory Visit: Payer: Self-pay

## 2023-10-27 ENCOUNTER — Other Ambulatory Visit: Payer: Self-pay

## 2023-10-27 ENCOUNTER — Ambulatory Visit: Attending: Cardiology | Admitting: *Deleted

## 2023-10-27 DIAGNOSIS — Z7901 Long term (current) use of anticoagulants: Secondary | ICD-10-CM | POA: Insufficient documentation

## 2023-10-27 DIAGNOSIS — Z5181 Encounter for therapeutic drug level monitoring: Secondary | ICD-10-CM | POA: Insufficient documentation

## 2023-10-27 DIAGNOSIS — I513 Intracardiac thrombosis, not elsewhere classified: Secondary | ICD-10-CM | POA: Diagnosis present

## 2023-10-27 LAB — POCT INR: INR: 2.6 (ref 2.0–3.0)

## 2023-10-27 NOTE — Progress Notes (Signed)
 Description   INR-2.6; Today take 3 tablets of warfarin then continue taking 2 tablets daily except 1.5 tablets on Thursdays and Saturdays.   Stay consistent with greens and protein drink each week. Recheck INR 2 weeks (normally 6 weeks).  Coumadin  Clinic [phone] BILL [id number]

## 2023-10-27 NOTE — Patient Instructions (Signed)
 Description   INR-2.6; Today take 3 tablets of warfarin then continue taking 2 tablets daily except 1.5 tablets on Thursdays and Saturdays.   Stay consistent with greens and protein drink each week. Recheck INR 2 weeks (normally 6 weeks).  Coumadin  Clinic [phone] BILL [id number]

## 2023-10-28 ENCOUNTER — Other Ambulatory Visit (HOSPITAL_COMMUNITY): Payer: Self-pay | Admitting: Psychiatry

## 2023-10-28 ENCOUNTER — Other Ambulatory Visit: Payer: Self-pay

## 2023-10-28 DIAGNOSIS — F411 Generalized anxiety disorder: Secondary | ICD-10-CM

## 2023-10-28 MED ORDER — HYDROXYZINE HCL 25 MG PO TABS
25.0000 mg | ORAL_TABLET | Freq: Three times a day (TID) | ORAL | 3 refills | Status: DC | PRN
  Filled 2023-10-28: qty 90, 30d supply, fill #0
  Filled 2023-12-05: qty 90, 30d supply, fill #1

## 2023-11-02 ENCOUNTER — Other Ambulatory Visit (HOSPITAL_COMMUNITY): Payer: Self-pay

## 2023-11-02 ENCOUNTER — Telehealth: Payer: Self-pay | Admitting: Pharmacy Technician

## 2023-11-02 NOTE — Telephone Encounter (Signed)
 Pharmacy Patient Advocate Encounter   Received notification from Pt Calls Messages - that prior authorization for wegovy 0.25mg  is required/requested.   Insurance verification completed.   The patient is insured through [employer].   Per test claim: PA required; PA submitted to above mentioned insurance via Latent Key/confirmation #/EOC [facility] Status is pending

## 2023-11-03 NOTE — Telephone Encounter (Signed)
 Pharmacy Patient Advocate Encounter  Received notification from [employer] that Prior Authorization for Georjean has been DENIED.  Full denial letter will be uploaded to the media tab. See denial reason below.   PA #/Case ID/Reference #: [id number]

## 2023-11-04 ENCOUNTER — Other Ambulatory Visit: Payer: Self-pay

## 2023-11-04 MED ORDER — LEVOTHYROXINE SODIUM 75 MCG PO TABS
75.0000 ug | ORAL_TABLET | Freq: Every day | ORAL | 0 refills | Status: DC
  Filled 2023-11-04: qty 90, 90d supply, fill #0

## 2023-11-09 NOTE — Telephone Encounter (Signed)
 Patient made aware of Wegovy Denial - reason for denial is BMI <40. Patient will continue with lifestyle interventions.

## 2023-11-10 ENCOUNTER — Ambulatory Visit: Attending: Cardiology | Admitting: Pharmacist

## 2023-11-10 DIAGNOSIS — Z7901 Long term (current) use of anticoagulants: Secondary | ICD-10-CM | POA: Diagnosis present

## 2023-11-10 DIAGNOSIS — Z5181 Encounter for therapeutic drug level monitoring: Secondary | ICD-10-CM | POA: Diagnosis present

## 2023-11-10 DIAGNOSIS — I513 Intracardiac thrombosis, not elsewhere classified: Secondary | ICD-10-CM | POA: Diagnosis present

## 2023-11-10 DIAGNOSIS — Z9889 Other specified postprocedural states: Secondary | ICD-10-CM | POA: Diagnosis present

## 2023-11-10 LAB — POCT INR: INR: 3.3 — AB (ref 2.0–3.0)

## 2023-11-10 NOTE — Progress Notes (Signed)
 Description   INR-3.3; Continue taking 2 tablets daily except 1.5 tablets on Thursdays and Saturdays.   Stay consistent with greens and protein drink each week. Recheck INR 4 weeks (normally 6 weeks).  Coumadin  Clinic [phone] BILL [id number]

## 2023-11-10 NOTE — Patient Instructions (Signed)
 Description   INR-3.3; Continue taking 2 tablets daily except 1.5 tablets on Thursdays and Saturdays.   Stay consistent with greens and protein drink each week. Recheck INR 4 weeks (normally 6 weeks).  Coumadin  Clinic [phone] BILL [id number]

## 2023-11-16 ENCOUNTER — Other Ambulatory Visit: Payer: Self-pay

## 2023-11-17 ENCOUNTER — Other Ambulatory Visit: Payer: Self-pay

## 2023-11-20 ENCOUNTER — Other Ambulatory Visit: Payer: Self-pay

## 2023-11-21 ENCOUNTER — Other Ambulatory Visit (HOSPITAL_COMMUNITY): Payer: Self-pay | Admitting: Cardiology

## 2023-11-23 ENCOUNTER — Other Ambulatory Visit: Payer: Self-pay

## 2023-11-23 MED ORDER — SPIRONOLACTONE 50 MG PO TABS
50.0000 mg | ORAL_TABLET | Freq: Every day | ORAL | 6 refills | Status: AC
  Filled 2023-11-23: qty 30, 30d supply, fill #0
  Filled 2023-12-19: qty 30, 30d supply, fill #1
  Filled 2024-01-24: qty 30, 30d supply, fill #2
  Filled 2024-02-18: qty 30, 30d supply, fill #3

## 2023-11-24 ENCOUNTER — Telehealth (HOSPITAL_COMMUNITY): Payer: Self-pay | Admitting: Cardiology

## 2023-11-24 NOTE — Telephone Encounter (Signed)
 Called to confirm/remind patient of their appointment at the [facility] on 11/24/23.   Appointment:   [] Confirmed  [] Left mess   [] No answer/No voice mail  [] VM Full/unable to leave message  [] Phone not in service  Patient reminded to bring all medications and/or complete list.  Confirmed patient has transportation. Gave directions, instructed to utilize valet parking.

## 2023-11-25 ENCOUNTER — Ambulatory Visit (HOSPITAL_COMMUNITY)
Admission: RE | Admit: 2023-11-25 | Discharge: 2023-11-25 | Disposition: A | Discharge status: Home / Self Care | Source: Ambulatory Visit | Attending: Cardiology | Admitting: Cardiology

## 2023-11-25 ENCOUNTER — Ambulatory Visit (HOSPITAL_COMMUNITY): Payer: Self-pay | Admitting: Cardiology

## 2023-11-25 ENCOUNTER — Encounter (HOSPITAL_COMMUNITY): Payer: Self-pay | Admitting: Cardiology

## 2023-11-25 ENCOUNTER — Ambulatory Visit (HOSPITAL_BASED_OUTPATIENT_CLINIC_OR_DEPARTMENT_OTHER)
Admission: RE | Admit: 2023-11-25 | Discharge: 2023-11-25 | Disposition: A | Discharge status: Home / Self Care | Source: Ambulatory Visit | Attending: Cardiology | Admitting: Cardiology

## 2023-11-25 ENCOUNTER — Other Ambulatory Visit: Payer: Self-pay

## 2023-11-25 VITALS — BP 98/62 | HR 84 | Ht 69.0 in | Wt 212.0 lb

## 2023-11-25 DIAGNOSIS — Z7982 Long term (current) use of aspirin: Secondary | ICD-10-CM | POA: Insufficient documentation

## 2023-11-25 DIAGNOSIS — I5022 Chronic systolic (congestive) heart failure: Secondary | ICD-10-CM

## 2023-11-25 DIAGNOSIS — I252 Old myocardial infarction: Secondary | ICD-10-CM | POA: Insufficient documentation

## 2023-11-25 DIAGNOSIS — I2510 Atherosclerotic heart disease of native coronary artery without angina pectoris: Secondary | ICD-10-CM | POA: Diagnosis not present

## 2023-11-25 DIAGNOSIS — I255 Ischemic cardiomyopathy: Secondary | ICD-10-CM | POA: Insufficient documentation

## 2023-11-25 DIAGNOSIS — F17210 Nicotine dependence, cigarettes, uncomplicated: Secondary | ICD-10-CM | POA: Insufficient documentation

## 2023-11-25 DIAGNOSIS — I350 Nonrheumatic aortic (valve) stenosis: Secondary | ICD-10-CM | POA: Diagnosis not present

## 2023-11-25 DIAGNOSIS — Z86718 Personal history of other venous thrombosis and embolism: Secondary | ICD-10-CM | POA: Insufficient documentation

## 2023-11-25 DIAGNOSIS — E785 Hyperlipidemia, unspecified: Secondary | ICD-10-CM | POA: Diagnosis not present

## 2023-11-25 DIAGNOSIS — Z952 Presence of prosthetic heart valve: Secondary | ICD-10-CM | POA: Insufficient documentation

## 2023-11-25 DIAGNOSIS — Z79899 Other long term (current) drug therapy: Secondary | ICD-10-CM | POA: Diagnosis not present

## 2023-11-25 DIAGNOSIS — Z7901 Long term (current) use of anticoagulants: Secondary | ICD-10-CM | POA: Insufficient documentation

## 2023-11-25 DIAGNOSIS — Z95810 Presence of automatic (implantable) cardiac defibrillator: Secondary | ICD-10-CM | POA: Diagnosis not present

## 2023-11-25 LAB — ECHOCARDIOGRAM COMPLETE
AR max vel: 1.12 cm2
AV Area VTI: 1.22 cm2
AV Area mean vel: 1.12 cm2
AV Mean grad: 12 mmHg
AV Peak grad: 20.1 mmHg
Ao pk vel: 2.24 m/s
Area-P 1/2: 2.9 cm2
S' Lateral: 3.8 cm

## 2023-11-25 LAB — BASIC METABOLIC PANEL WITH GFR
Anion gap: 16 — ABNORMAL HIGH (ref 5–15)
BUN: 35 mg/dL — ABNORMAL HIGH (ref 6–20)
CO2: 23 mmol/L (ref 22–32)
Calcium: 9.2 mg/dL (ref 8.9–10.3)
Chloride: 94 mmol/L — ABNORMAL LOW (ref 98–111)
Creatinine, Ser: 1.38 mg/dL — ABNORMAL HIGH (ref 0.61–1.24)
GFR, Estimated: 59 mL/min — ABNORMAL LOW (ref >60)
Glucose, Bld: 119 mg/dL — ABNORMAL HIGH (ref 70–99)
Potassium: 4.3 mmol/L (ref 3.5–5.1)
Sodium: 133 mmol/L — ABNORMAL LOW (ref 135–145)

## 2023-11-25 LAB — CBC
HCT: 41.7 % (ref 39.0–52.0)
Hemoglobin: 14.2 g/dL (ref 13.0–17.0)
MCH: 29.8 pg (ref 26.0–34.0)
MCHC: 34.1 g/dL (ref 30.0–36.0)
MCV: 87.4 fL (ref 80.0–100.0)
Platelets: 284 K/uL (ref 150–400)
RBC: 4.77 MIL/uL (ref 4.22–5.81)
RDW: 14 % (ref 11.5–15.5)
WBC: 7.7 K/uL (ref 4.0–10.5)
nRBC: 0 % (ref 0.0–0.2)

## 2023-11-25 LAB — DIGOXIN LEVEL: Digoxin Level: <0.6 ng/mL — ABNORMAL LOW (ref 0.8–2.0)

## 2023-11-25 LAB — BRAIN NATRIURETIC PEPTIDE: B Natriuretic Peptide: 44.5 pg/mL (ref 0.0–100.0)

## 2023-11-25 MED ORDER — VARENICLINE TARTRATE (STARTER) 0.5 MG X 11 & 1 MG X 42 PO TBPK
ORAL_TABLET | ORAL | 0 refills | Status: DC
  Filled 2023-11-25: qty 53, 30d supply, fill #0

## 2023-11-25 NOTE — Patient Instructions (Signed)
 Medication Changes:  START CHANTIX --PLEASE FOLLOW INSTRUCTIONS ON THE STARTER PACK   Lab Work:  Labs done today, your results will be available in MyChart, we will contact you for abnormal readings.  Follow-Up in: 3 MONTHS AS SCHEDULED WITH APP CLINIC  At the [facility], you and your health needs are our priority. We have a designated team specialized in the treatment of Heart Failure. This Care Team includes your primary Heart Failure Specialized Cardiologist (physician), Advanced Practice Providers (APPs- Physician Assistants and Nurse Practitioners), and Pharmacist who all work together to provide you with the care you need, when you need it.   You may see any of the following providers on your designated Care Team at your next follow up:  Dr. Toribio Fuel Dr. Ezra Shuck Dr. Odis Brownie Greig Mosses, NP Caffie Shed, [place] [facility] [place], [place] Beckey Coe, NP Jordan Lee, NP Tinnie Redman, PharmD   Please be sure to bring in all your medications bottles to every appointment.   Need to Contact Us :  If you have any questions or concerns before your next appointment please send us  a message through [facility] or call our office at [phone].    TO LEAVE A MESSAGE FOR THE NURSE SELECT OPTION 2, PLEASE LEAVE A MESSAGE INCLUDING: YOUR NAME DATE OF BIRTH CALL BACK NUMBER REASON FOR CALL**this is important as we prioritize the call backs  YOU WILL RECEIVE A CALL BACK THE SAME DAY AS LONG AS YOU CALL BEFORE 4:00 PM

## 2023-11-25 NOTE — Progress Notes (Signed)
 [facility] Note   PCP: Henry Ingle, MD HF Cardiology: Dr. Rolan  Chief complaint: CHF  HPI: 58 y.o. male smoker was admitted on 08/28/19 for delayed presentation anterior STEMI. Had had >36 hr of chest pain prior to seeking medical attention. Pain progressed to more pleuritic like CP. Found to have anterior ST elevations w/ precordial Q waves on admit. Hs troponin >27,000. Urgent cardiac cath showed totally occluded mid LAD w/o collaterals and 40% mid RCA stenosis. It was suspected he had completed his infarct and residual pleuritic CP was post-MI pericarditis. No intervention was performed. 2D echo demonstrated moderately reduced LVEF, 45-50%, + apical thrombus and moderate AS. MV normal. RV systolic function mildly reduced.    He was placed on medical management for CAD and systolic HF. He was started on ASA, atorvastatin , Losartan  and Coreg . Coumadin  started for apical thrombus w/ heparin  bridge.   Post cath, there were initial concerns for developing cardiogenic shock but he remained stable and did not require pressor/inotropic support. Co-ox remained stable. Repeat echo showed further reduction of LVEF down to 30-35% but no post MI mechanical complications. RV mildly reduced. He did require IV Lasix  for pulmonary edema and volume status and dyspnea improved w/ diuresis. He was continued on GTDMT w/ losartan , spironolactone  and Coreg . BP too soft for Entresto . He was treated w/ colchicine  for post MI pericarditis w/ improvement in pleuritic chest pain. Given anterior MI w/ EF <35%, he was fitted w/ a LifeVest prior to discharge w/ plans to get repeat echo in 1 month.   He had repeat echo in 9/21, showing EF 25-30%.  He had [employer] ICD placed in 11/21.  Echo in 8/22 showed EF 30% with no LV thrombus, normal RV, normal IVC, moderate AS with mean gradient 28 mmHg and AVA 1.08 cm^2. Echo in 12/22 showed EF 30-35%, no LV thrombus, possibly bicuspid aortic valve with  mean gradient 34 mmHg and AVA 1.02 cm^2, normal RV, IVC normal.   Echo was done in 8/23 showing LV thrombus.  INR goal on warfarin was increased to 2.5-3.5.  Echo 10/23 showed EF remains 25-30% with peri-apical akinesis and a chronic-appearing LV thrombus, severe low flow/low gradient aortic stenosis (mean gradient 27 mmHg with AVA 0.88 cm^2), bicuspid aortic valve, normal RV, normal IVC. INR increased to 3-3.5.  We have discussed barostimulation activation therapy, but Medicaid does not have good coverage for this.   Follow up 10/23, continued with NYHA II-III symptoms. Referred to structural heart team for TAVR work up, underwent L/RHC showing chronic occlusion of mLAD without target and moderate RCA stenosis, severe AS and normal right heart pressures. Decided SAVR was best option with LV thrombus.  Seen in ED 01/11/22 with acute ETOH intoxication and LOC. BP low, given IVF with improvement. Discharged home.  In 1/24, he had AVR with St Jude mechanical AoV.  Post-op, he was taken off dapagliflozin , Entresto , and spironolactone . He quit smoking a couple of weeks before AVR.   Echo in 10/24 showed EF 35-40% with aneurysmal apex, mild LVH, normal RV, mechanical aortic valve functioning normally.   Echo was done today and reviewed, EF 35-40% with peri-apical akinesis, normal RV, mechanical aortic valve functioning normally, IVC normal.   Today he returns for HF follow up. He is still smoking.  Weight up, but he has been exercising regularly.  He walks on the treadmill for 30-45 minutes. Occasional lightheadedness if standing up too quickly. No chest pain.  No dyspnea except with  heavy exertion.  No BRBPR/melena.   [employer] device interrogation (personally reviewed): HeartLogic score 0, no VT  Labs (1/24): K 3.9, creatinine 1.08 Labs (3/24): K 4.2, SCr 1.18 Labs (5/24): K 4.6, creatinine 1.04, hgb 11.9 Labs (8/24): LDL 46, TGs 368, hgb 14.9 Labs (9/24): K 4.3, creatinine 1.13 Labs  (10/24): LDL 44, TGs 103, K 4.3, creatinine 1.13 Labs (5/25): LDL 63 Labs (6/25): K 4.2, creatinine 1.25 Labs (7/25): LDL 58, TGs 199 Labs (8/25): K 3.9, creatinine 1.33  PMH: 1. GERD 2. Depression  3. CAD: Anterior STEMI in 8/21 with totally occluded LAD/no collaterals, 40% mid RCA.  Due to late presentation, no PCI was done.  - Had post-infarct pericarditis.  - LHC (11/23): mLAD 100% stenosed, pRCA 50% stenosed 4. Chronic systolic CHF: Ischemic cardiomyopathy. [employer] ICD.  - Echo (9/21) with EF 25-30%, normal RV, moderate AS with mean gradient 22 mmHg.  - Echo (8/22): EF 30% with no LV thrombus, normal RV, normal IVC, moderate AS with mean gradient 28 mmHg and AVA 1.08 cm^2.  - Echo (12/22): EF 30-35%, no LV thrombus, possibly bicuspid aortic valve with mean gradient 34 mmHg and AVA 1.02 cm^2, normal RV, IVC normal.  - Echo (10/23): EF remains 25-30% with peri-apical akinesis and a chronic-appearing LV thrombus, severe low flow/low gradient aortic stenosis (mean gradient 27 mmHg with AVA 0.88 cm^2), bicuspid aortic valve, normal RV, normal IVC. - R/LHC (11/23): mLAD 100% stenosed, pRCA 50% stenosed; RA mean 3, PA 25/14 mean 18, PCWP 13, CO/CI (Fick) 5.03/2.54 - TEE 1/24 EF 20-30%, normal RV - Echo (10/24): EF 35-40% with aneurysmal apex, mild LVH, normal RV, mechanical aortic valve functioning normally.  - Echo (10/25): EF 35-40% with peri-apical akinesis, normal RV, mechanical aortic valve functioning normally, IVC normal.  5. LV thrombus: Apical thrombus noted on echo at time of MI in 8/21. Resolved by 9/21 echo.  - Echo 10/23 with chronic-appearing, immobile LV thrombus.  6. Aortic stenosis: Bicuspid valve. Moderate on 12/22 echo.  - 10/23 echo showed low flow/low gradient severe AS.   - St Jude mechanical AVR in 1/24 7. Prior smoker.   Current Outpatient Medications  Medication Sig Dispense Refill   acetaminophen  (TYLENOL ) 500 MG tablet Take 500 mg by mouth every 6  (six) hours as needed for headache.     aspirin  81 MG chewable tablet Chew 1 tablet (81 mg total) by mouth daily. 90 tablet 3   atorvastatin  (LIPITOR ) 80 MG tablet Take 1 tablet (80 mg total) by mouth daily. 90 tablet 3   Brexpiprazole  (REXULTI ) 0.5 MG TABS Take 2 tablets (1 mg total) by mouth daily in the afternoon.Courtesy refill, please follow up with prescribing provider for further refills 180 tablet 0   carvedilol  (COREG ) 6.25 MG tablet Take 1 tablet (6.25 mg total) by mouth 2 (two) times daily. 60 tablet 6   dapagliflozin  propanediol (FARXIGA ) 10 MG TABS tablet Take 1 tablet (10 mg total) by mouth daily before breakfast. 30 tablet 11   digoxin  (LANOXIN ) 0.125 MG tablet Take 1/2 tablet (0.0625 mg total) by mouth daily. 45 tablet 3   ezetimibe  (ZETIA ) 10 MG tablet Take 1 tablet (10 mg total) by mouth daily. 90 tablet 3   fenofibrate  160 MG tablet Take 1 tablet (160 mg total) by mouth daily. 30 tablet 3   furosemide  (LASIX ) 20 MG tablet Take 2 tablets (40 mg total) by mouth daily. 180 tablet 3   hydrOXYzine  (ATARAX ) 25 MG tablet Take 1 tablet (25  mg total) by mouth 3 (three) times daily as needed. 90 tablet 3   levothyroxine  (SYNTHROID ) 75 MCG tablet Take 1 tablet (75 mcg total) by mouth daily. 90 tablet 0   melatonin 5 MG TABS Take 2 tablets (10 mg total) by mouth at bedtime. 30 tablet 3   mirtazapine  (REMERON ) 45 MG tablet Take 1 tablet (45 mg total) by mouth at bedtime. 30 tablet 3   Multiple Vitamin (MULTIVITAMIN WITH MINERALS) TABS tablet Take 1 tablet by mouth daily.     omega-3 acid ethyl esters (LOVAZA ) 1 g capsule Take 2 capsules (2 g total) by mouth 2 (two) times daily. 360 capsule 3   omeprazole  (PRILOSEC ) 20 MG capsule Take 20 mg by mouth daily before breakfast.     Probiotic Product (PROBIOTIC DAILY PO) Take 1 capsule by mouth with breakfast, with lunch, and with evening meal.     sacubitril -valsartan  (ENTRESTO ) 97-103 MG Take 1 tablet by mouth 2 (two) times daily. 60 tablet 11    SILDENAFIL CITRATE PO Take 45-90 mg by mouth daily as needed (ED). Take 1 - 2 tablets 30 to 45 min before sexual activity as needed     spironolactone  (ALDACTONE ) 50 MG tablet Take 1 tablet (50 mg total) by mouth daily. 30 tablet 6   Varenicline  Tartrate, Starter, (CHANTIX  STARTING MONTH PAK) 0.5 MG X 11 & 1 MG X 42 TBPK Take one 0.5 mg tablet by mouth once daily for 3 days, then increase to one 0.5 mg tablet twice daily for 4 days, then increase to one 1 mg tablet twice daily. 53 each 0   warfarin (COUMADIN ) 5 MG tablet Take 1.5 tablets to 2 tablets by mouth once daily as directed by Anticoagulation clinic 55 tablet 3   levothyroxine  (SYNTHROID ) 50 MCG tablet Take 1 tablet (50 mcg total) by mouth daily. (Patient not taking: Reported on 11/25/2023) 90 tablet 0   No current facility-administered medications for this encounter.   No Known Allergies  Social History   Socioeconomic History   Marital status: Married    Spouse name: Not on file   Number of children: 0   Years of education: Not on file   Highest education level: Not on file  Occupational History   Occupation: clinical research associate   Occupation: Attorney/Writer  Tobacco Use   Smoking status: Former    Current packs/day: 0.00    Types: Cigarettes    Quit date: 02/15/2022    Years since quitting: 1.7   Smokeless tobacco: Never   Tobacco comments:    x15 yrs as of 2021  Vaping Use   Vaping status: Never Used  Substance and Sexual Activity   Alcohol use: Not Currently    Comment: a couple of drinks on the weekends   Drug use: Never   Sexual activity: Yes    Birth control/protection: None  Other Topics Concern   Not on file  Social History Narrative   2 adopted children   Social Drivers of Health   Financial Resource Strain: Not on file  Food Insecurity: No Food Insecurity (02/20/2022)   Hunger Vital Sign    Worried About Running Out of Food in the Last Year: Never true    Ran Out of Food in the Last Year: Never true   Transportation Needs: No Transportation Needs (02/20/2022)   PRAPARE - Administrator, Civil Service (Medical): No    Lack of Transportation (Non-Medical): No  Physical Activity: Not on file  Stress: Not on file  Social Connections: Not on file  Intimate Partner Violence: Not At Risk (02/20/2022)   Humiliation, Afraid, Rape, and Kick questionnaire    Fear of Current or Ex-Partner: No    Emotionally Abused: No    Physically Abused: No    Sexually Abused: No   Family History  Problem Relation Age of Onset   Cancer Mother        peritoneal   Arrhythmia Father    Prostate cancer Father    Colon cancer Brother 19   Cancer Maternal Grandmother        type unknown   Heart attack Maternal Grandfather    Diabetes Paternal Grandmother    Heart attack Paternal Grandfather    BP 98/62   Pulse 84   Ht 5' 9 (1.753 m)   Wt 96.2 kg (212 lb)   SpO2 92%   BMI 31.31 kg/m   Wt Readings from Last 3 Encounters:  11/25/23 96.2 kg (212 lb)  10/07/23 92.2 kg (203 lb 3.2 oz)  08/18/23 92.5 kg (204 lb)   PHYSICAL EXAM: General: NAD Neck: No JVD, no thyromegaly or thyroid nodule.  Lungs: Clear to auscultation bilaterally with normal respiratory effort. CV: Nondisplaced PMI.  Heart regular S1/S2 with mechanical S2, no S3/S4, 1/6 SEM RUSB.  No peripheral edema.  No carotid bruit.  Normal pedal pulses.  Abdomen: Soft, nontender, no hepatosplenomegaly, no distention.  Skin: Intact without lesions or rashes.  Neurologic: Alert and oriented x 3.  Psych: Normal affect. Extremities: No clubbing or cyanosis.  HEENT: Normal.   ASSESSMENT & PLAN: 1. CAD: Admission 8/21 for acute anterior MI, delayed presentation with no ischemic pain, had pleuritic-type chest pain (post-MI pericarditis). LHC with totally occluded mid LAD w/o collaterals. Suspected completed MI, no intervention.  Repeat LHC in 11/23 with mLAD 100% stenosed, pRCA 50% stenosed.  No chest pain.  - Continue ASA 81 daily.   -  Continue atorvastatin  80 mg daily and Zetia  10 daily, good lipids in 7/25.   2. Chronic systolic CHF: Ischemic cardiomyopathy.  Reduced EF post anterior MI.  Echo in 9/21 with EF 25-30%, peri-apical akinesis, moderate AS. Cath w/ occluded mLAD but no intervention as he had completed his infarct. He has [employer] ICD.  Echo in 8/22 with EF 30%.  Echo in 12/22 with EF 30-35%, normal RV, moderate AS. Echo 10/23 showed EF remains 25-30% with peri-apical akinesis and a chronic-appearing LV thrombus, severe low flow/low gradient aortic stenosis (mean gradient 27 mmHg with AVA 0.88 cm^2), bicuspid aortic valve, normal RV, normal IVC.  Now s/p mechanical AVR.  TEE 1/24 EF 20-30%, normal RV.  Echo 10/24 and again in 10/25 showed EF 35-40% with aneurysmal apex, mild LVH, normal RV, mechanical aortic valve functioning normally. NYHA class I with no volume overload by exam or HeartLogic.   - Continue Lasix  40 mg daily.  BMET/BNP today. - Continue Entresto  97/103 bid. - Continue spironolactone  50 mg daily.  - Continue Coreg  6.25 mg bid.  - Continue dapagliflozin  10 mg daily.  - Continue digoxin  0.0625, check level today.  3. 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   4. LV thrombus: Large LV thrombus noted on echo in 8/21, resolved on 9/21 echo.  Thrombus had recurred on 8/23 echo and warfarin was increased to INR goal 2.5-3.5 and ASA 81 was added.  No thrombus seen on today's echo.  - Continue warfarin with INR goal 2.5-3.5 and continue ASA 81 daily.  5. Tobacco use: He is back smoking 1/2  ppd. Had success in the past with Chantix . - He is willing to try Chantix  again.  6. Hyperlipidemia: LDL goal < 55. Good lipids in 7/25.  - Continue atorvastatin  80 mg daily. - Continue Zetia  10 mg daily. - Continue fenofibrate  160 mg daily. - Continue Lovaza  2 g bid. 7. Aortic stenosis: Bicuspid aortic valve with moderate AS on 12/22 echo. Echo in 10/23 showed severe low flow/low gradient aortic stenosis with mean gradient 27  mmHg and AVA 0.88 cm^2.  He had SAVR 02/19/22 with Dr. Maryjane with St Jude mechanical aortic valve.  - Continue ASA 81 + warfarin (INR goal 2.5-3.5).  8. Alcohol abuse: He has quit. Congratulated.  Follow up in 3-4 months with APP.   I spent 31 minutes reviewing records, interviewing/examining patient, and managing orders.   Ezra Shuck, MD  11/25/2023

## 2023-11-26 ENCOUNTER — Other Ambulatory Visit: Payer: Self-pay

## 2023-11-30 ENCOUNTER — Other Ambulatory Visit: Payer: Self-pay

## 2023-12-07 ENCOUNTER — Other Ambulatory Visit: Payer: Self-pay

## 2023-12-07 MED ORDER — LEVOTHYROXINE SODIUM 75 MCG PO TABS
75.0000 ug | ORAL_TABLET | Freq: Every day | ORAL | 0 refills | Status: AC
  Filled 2023-12-07 – 2024-01-30 (×2): qty 90, 90d supply, fill #0

## 2023-12-08 ENCOUNTER — Ambulatory Visit: Attending: Cardiology | Admitting: Pharmacist

## 2023-12-08 DIAGNOSIS — Z7901 Long term (current) use of anticoagulants: Secondary | ICD-10-CM | POA: Insufficient documentation

## 2023-12-08 DIAGNOSIS — I513 Intracardiac thrombosis, not elsewhere classified: Secondary | ICD-10-CM | POA: Diagnosis present

## 2023-12-08 DIAGNOSIS — Z9889 Other specified postprocedural states: Secondary | ICD-10-CM | POA: Diagnosis present

## 2023-12-08 DIAGNOSIS — Z5181 Encounter for therapeutic drug level monitoring: Secondary | ICD-10-CM | POA: Insufficient documentation

## 2023-12-08 LAB — POCT INR: INR: 3.7 — AB (ref 2.0–3.0)

## 2023-12-08 NOTE — Progress Notes (Signed)
 Description   INR-3.7; Eat greens today and then continue taking 2 tablets daily except 1.5 tablets on Thursdays and Saturdays.   Stay consistent with greens and protein drink each week. Recheck INR 4 weeks (normally 6 weeks).  Coumadin  Clinic [phone] BILL [id number]

## 2023-12-08 NOTE — Patient Instructions (Addendum)
 Description   INR-3.7; Eat greens today and then continue taking 2 tablets daily except 1.5 tablets on Thursdays and Saturdays.   Stay consistent with greens and protein drink each week. Recheck INR 4 weeks (normally 6 weeks).  Coumadin  Clinic [phone] BILL [id number]

## 2023-12-11 ENCOUNTER — Other Ambulatory Visit: Payer: Self-pay

## 2023-12-11 ENCOUNTER — Telehealth (HOSPITAL_COMMUNITY): Admitting: Psychiatry

## 2023-12-11 ENCOUNTER — Encounter (HOSPITAL_COMMUNITY): Payer: Self-pay | Admitting: Psychiatry

## 2023-12-11 DIAGNOSIS — F411 Generalized anxiety disorder: Secondary | ICD-10-CM

## 2023-12-11 DIAGNOSIS — F32A Depression, unspecified: Secondary | ICD-10-CM

## 2023-12-11 MED ORDER — BREXPIPRAZOLE 2 MG PO TABS
2.0000 mg | ORAL_TABLET | Freq: Every day | ORAL | 3 refills | Status: DC
  Filled 2023-12-11: qty 30, 30d supply, fill #0
  Filled 2024-02-07: qty 30, 30d supply, fill #1

## 2023-12-11 MED ORDER — MELATONIN 5 MG PO TABS
10.0000 mg | ORAL_TABLET | Freq: Every day | ORAL | 3 refills | Status: AC
  Filled 2023-12-11: qty 30, 15d supply, fill #0

## 2023-12-11 MED ORDER — HYDROXYZINE HCL 50 MG PO TABS
50.0000 mg | ORAL_TABLET | Freq: Three times a day (TID) | ORAL | 3 refills | Status: AC | PRN
  Filled 2023-12-11: qty 90, 30d supply, fill #0
  Filled 2024-01-24: qty 90, 30d supply, fill #1
  Filled 2024-02-23: qty 90, 30d supply, fill #2

## 2023-12-11 MED ORDER — MIRTAZAPINE 45 MG PO TABS
45.0000 mg | ORAL_TABLET | Freq: Every day | ORAL | 3 refills | Status: DC
  Filled 2023-12-11 – 2023-12-27 (×2): qty 30, 30d supply, fill #0
  Filled 2024-01-24: qty 30, 30d supply, fill #1

## 2023-12-11 NOTE — Progress Notes (Signed)
 [facility] MD/PA/NP OP Progress Note Virtual Visit via Video Note  I connected with Craig Flores on 12/11/23 at 10:30 AM EST by a video enabled telemedicine application and verified that I am speaking with the correct person using two identifiers.  Location: Patient: home Provider: Clinic   I discussed the limitations of evaluation and management by telemedicine and the availability of in person appointments. The patient expressed understanding and agreed to proceed.  I provided 30 minutes of non-face-to-face time during this encounter.       12/11/2023 10:47 AM Craig Flores  MRN:  [id number]  Chief Complaint: I feel on edge sometimes  HPI:  58 year old male seen today for follow up psychiatric evaluation. He has a psychiatric history of alcohol dependence, substance induced mood disorder, anxiety, depression, and SI. He is currently being managed on  hydroxyzine  25 mg three times daily as needed, Rexulti  1 mg daily, and Mirtazapine  45 mg at nightly.  Patient also takes melatonin 5 mg provides it over-the-counter.  He notes that his medications are somewhat effective in managing his psychiatric conditions.   Today he well pleasant, cooperative, engaged in conversation.  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  He notes that he recently has been experiencing shortness of breath due to cardiac issues.  He notes that he is uncertain if he is anxious or if he is having physical issues that are exacerbating his anxiety.  Patient does note that he worries about his family and finances.  He reports that he is uncertain about how he could be more helpful to his family.  He worries about how his family will cope if he is not here.    Patient notes that the above exacerbates his anxiety and depression.  Today provider conducted GAD-7 and patient scored a 14.  Provider also conducted PHQ-9 the patient scored a 12.  He endorses adequate sleep and increased appetite.  Patient notes that he  has gained 10 pounds since his last visit.  He notes that his PCP attempted to get him [facility] however notes that the prior authorization was denied.   Today he denies SI/HI/VAH, mania, paranoia.     Today patient agreeable to increasing Rexulti  1 mg to 2 mg to help manage mood and anxiety.  He is also agreeable to increasing hydroxyzine  25 mg 3 times daily to 50 mg 3 times daily as needed to help manage anxiety.  He will continue other medications as prescribed. No other concerns noted at this time.   Visit Diagnosis:    ICD-10-CM   1. Generalized anxiety disorder  F41.1 mirtazapine  (REMERON ) 45 MG tablet    hydrOXYzine  (ATARAX ) 50 MG tablet    2. Mild depression  F32.A mirtazapine  (REMERON ) 45 MG tablet    melatonin 5 MG TABS    brexpiprazole  (REXULTI ) 2 MG TABS tablet           Past Psychiatric History: alcohol dependence, substance induced mood disorder, anxiety, depression, and SI.   Past Medical History:  Past Medical History:  Diagnosis Date   Acid reflux    AICD (automatic cardioverter/defibrillator) present    Anxiety    Aortic stenosis with bicuspid valve    CHF (congestive heart failure) (HCC)    Depression    Dyspnea    GERD (gastroesophageal reflux disease)    HLD (hyperlipidemia)    Ischemic cardiomyopathy    Migraines    Myocardial infarction (HCC) 08/28/2019    Past Surgical History:  Procedure Laterality Date   AORTIC VALVE REPLACEMENT N/A 02/19/2022   Procedure: AORTIC VALVE REPLACEMENT (AVR) 23mm SJM REGENT MECHANICAL AORTIC VALVE;  Surgeon: Maryjane Mt, MD;  Location: [facility] OR;  Service: Open Heart Surgery;  Laterality: N/A;   COLONOSCOPY WITH PROPOFOL  N/A 07/16/2021   Procedure: COLONOSCOPY WITH PROPOFOL ;  Surgeon: Avram Lupita BRAVO, MD;  Location: [facility] ENDOSCOPY;  Service: Gastroenterology;  Laterality: N/A;   CORONARY/GRAFT ACUTE MI REVASCULARIZATION N/A 08/28/2019   Procedure: Coronary/Graft Acute MI Revascularization;  Surgeon: Court Dorn PARAS, MD;   Location: [facility] INVASIVE CV LAB;  Service: Cardiovascular;  Laterality: N/A;   ICD IMPLANT N/A 11/28/2019   Procedure: ICD IMPLANT;  Surgeon: Fernande Elspeth BROCKS, MD;  Location: [facility] INVASIVE CV LAB;  Service: Cardiovascular;  Laterality: N/A;   LEFT HEART CATH AND CORONARY ANGIOGRAPHY N/A 08/28/2019   Procedure: LEFT HEART CATH AND CORONARY ANGIOGRAPHY;  Surgeon: Court Dorn PARAS, MD;  Location: [facility] INVASIVE CV LAB;  Service: Cardiovascular;  Laterality: N/A;   POLYPECTOMY  07/16/2021   Procedure: POLYPECTOMY;  Surgeon: Avram Lupita BRAVO, MD;  Location: [facility] ENDOS[facility];  Service: Gastroenterology;;   RIGHT HEART CATH AND CORONARY ANGIOGRAPHY N/A 12/12/2021   Procedure: RIGHT HEART CATH AND CORONARY ANGIOGRAPHY;  Surgeon: Verlin Lonni BIRCH, MD;  Location: [facility] INVASIVE CV LAB;  Service: Cardiovascular;  Laterality: N/A;   TEE WITHOUT CARDIOVERSION N/A 02/19/2022   Procedure: TRANSESOPHAGEAL ECHOCARDIOGRAM (TEE);  Surgeon: Maryjane Mt, MD;  Location: [facility] OR;  Service: Open Heart Surgery;  Laterality: N/A;   WISDOM TOOTH EXTRACTION      Family Psychiatric History: Notes that his mother and aunt had mental health issues. Notes he belives they had anxiety  Family History:  Family History  Problem Relation Age of Onset   Cancer Mother        peritoneal   Arrhythmia Father    Prostate cancer Father    Colon cancer Brother 21   Cancer Maternal Grandmother        type unknown   Heart attack Maternal Grandfather    Diabetes Paternal Grandmother    Heart attack Paternal Grandfather     Social History:  Social History   Socioeconomic History   Marital status: Married    Spouse name: Not on file   Number of children: 0   Years of education: Not on file   Highest education level: Not on file  Occupational History   Occupation: clinical research associate   Occupation: Attorney/Writer  Tobacco Use   Smoking status: Former    Current packs/day: 0.00    Types: Cigarettes    Quit date: 02/15/2022    Years since  quitting: 1.8   Smokeless tobacco: Never   Tobacco comments:    x15 yrs as of 2021  Vaping Use   Vaping status: Never Used  Substance and Sexual Activity   Alcohol use: Not Currently    Comment: a couple of drinks on the weekends   Drug use: Never   Sexual activity: Yes    Birth control/protection: None  Other Topics Concern   Not on file  Social History Narrative   2 adopted children   Social Drivers of Health   Financial Resource Strain: Not on file  Food Insecurity: No Food Insecurity (02/20/2022)   Hunger Vital Sign    Worried About Running Out of Food in the Last Year: Never true    Ran Out of Food in the Last Year: Never true  Transportation Needs: No Transportation Needs (02/20/2022)   PRAPARE -  Administrator, Civil Service (Medical): No    Lack of Transportation (Non-Medical): No  Physical Activity: Not on file  Stress: Not on file  Social Connections: Not on file    Allergies: No Known Allergies  Metabolic Disorder Labs: Lab Results  Component Value Date   HGBA1C 5.6 08/18/2023   MPG 114.02 08/18/2023   MPG 119.76 02/17/2022   No results found for: PROLACTIN Lab Results  Component Value Date   CHOL 136 08/18/2023   TRIG 199 (H) 08/18/2023   HDL 38 (L) 08/18/2023   CHOLHDL 3.6 08/18/2023   VLDL 40 08/18/2023   LDLCALC 58 08/18/2023   LDLCALC 63 06/18/2023   Lab Results  Component Value Date   TSH 3.181 08/28/2019    Therapeutic Level Labs: No results found for: LITHIUM No results found for: VALPROATE No results found for: CBMZ  Current Medications: Current Outpatient Medications  Medication Sig Dispense Refill   brexpiprazole  (REXULTI ) 2 MG TABS tablet Take 1 tablet (2 mg total) by mouth daily. 30 tablet 3   acetaminophen  (TYLENOL ) 500 MG tablet Take 500 mg by mouth every 6 (six) hours as needed for headache.     aspirin  81 MG chewable tablet Chew 1 tablet (81 mg total) by mouth daily. 90 tablet 3   atorvastatin  (LIPITOR )  80 MG tablet Take 1 tablet (80 mg total) by mouth daily. 90 tablet 3   carvedilol  (COREG ) 6.25 MG tablet Take 1 tablet (6.25 mg total) by mouth 2 (two) times daily. 60 tablet 6   dapagliflozin  propanediol (FARXIGA ) 10 MG TABS tablet Take 1 tablet (10 mg total) by mouth daily before breakfast. 30 tablet 11   digoxin  (LANOXIN ) 0.125 MG tablet Take 1/2 tablet (0.0625 mg total) by mouth daily. 45 tablet 3   ezetimibe  (ZETIA ) 10 MG tablet Take 1 tablet (10 mg total) by mouth daily. 90 tablet 3   fenofibrate  160 MG tablet Take 1 tablet (160 mg total) by mouth daily. 30 tablet 3   furosemide  (LASIX ) 20 MG tablet Take 2 tablets (40 mg total) by mouth daily. 180 tablet 3   hydrOXYzine  (ATARAX ) 50 MG tablet Take 1 tablet (50 mg total) by mouth 3 (three) times daily as needed. 90 tablet 3   levothyroxine  (SYNTHROID ) 50 MCG tablet Take 1 tablet (50 mcg total) by mouth daily. (Patient not taking: Reported on 11/25/2023) 90 tablet 0   levothyroxine  (SYNTHROID ) 75 MCG tablet Take 1 tablet (75 mcg total) by mouth daily. 90 tablet 0   melatonin 5 MG TABS Take 2 tablets (10 mg total) by mouth at bedtime. 30 tablet 3   mirtazapine  (REMERON ) 45 MG tablet Take 1 tablet (45 mg total) by mouth at bedtime. 30 tablet 3   Multiple Vitamin (MULTIVITAMIN WITH MINERALS) TABS tablet Take 1 tablet by mouth daily.     omega-3 acid ethyl esters (LOVAZA ) 1 g capsule Take 2 capsules (2 g total) by mouth 2 (two) times daily. 360 capsule 3   omeprazole  (PRILOSEC ) 20 MG capsule Take 20 mg by mouth daily before breakfast.     Probiotic Product (PROBIOTIC DAILY PO) Take 1 capsule by mouth with breakfast, with lunch, and with evening meal.     sacubitril -valsartan  (ENTRESTO ) 97-103 MG Take 1 tablet by mouth 2 (two) times daily. 60 tablet 11   SILDENAFIL CITRATE PO Take 45-90 mg by mouth daily as needed (ED). Take 1 - 2 tablets 30 to 45 min before sexual activity as needed  spironolactone  (ALDACTONE ) 50 MG tablet Take 1 tablet (50 mg  total) by mouth daily. 30 tablet 6   Varenicline  Tartrate, Starter, (CHANTIX  STARTING MONTH PAK) 0.5 MG X 11 & 1 MG X 42 TBPK Take one 0.5 mg tablet by mouth once daily for 3 days, then increase to one 0.5 mg tablet twice daily for 4 days, then increase to one 1 mg tablet twice daily. 53 each 0   warfarin (COUMADIN ) 5 MG tablet Take 1.5 tablets to 2 tablets by mouth once daily as directed by Anticoagulation clinic 55 tablet 3   No current facility-administered medications for this visit.     Musculoskeletal: Strength & Muscle Tone: Normal, telehealth visit Gait & Station: normal, telehealth visit Patient leans: N/A  Psychiatric Specialty Exam: Review of Systems  There were no vitals taken for this visit.There is no height or weight on file to calculate BMI.  General Appearance: Well Groomed  Eye Contact:  Good  Speech:  Clear and Coherent and Normal Rate  Volume:  Normal  Mood:  Anxious and Depressed  Affect:  Appropriate and Congruent  Thought Process:  Coherent, Goal Directed and Linear  Orientation:  Full (Time, Place, and Person)  Thought Content: WDL and Logical   Suicidal Thoughts:  No  Homicidal Thoughts:  No  Memory:  Immediate;   Good Recent;   Good Remote;   Good  Judgement:  Good  Insight:  Good  Psychomotor Activity:  Normal  Concentration:  Concentration: Good and Attention Span: Good  Recall:  Good  Fund of Knowledge: Good  Language: Good  Akathisia:  No  Handed:  Right  AIMS (if indicated): Not done  Assets:  Communication Skills Desire for Improvement Financial Resources/Insurance Housing Intimacy Social Support  ADL's:  Intact  Cognition: WNL  Sleep:  Good   Screenings: AIMS    Flowsheet Row [facility]  AIMS Total Score 0   AUDIT    Flowsheet Row Video Visit from 05/20/2023 in [facility]  Alcohol Use Disorder Identification Test Final Score (AUDIT) 10 19   GAD-7    Flowsheet Row Video Visit from 12/11/2023 in [facility] Video Visit from 05/20/2023 in [facility] Video Visit from 02/26/2023 in [facility] Video Visit from 12/17/2022 in [facility] Video Visit from 11[facility]14/2022 in [facility]  Total GAD-7 Score 14 13 10 6 10    PHQ2-9    Flowsheet Row Video Visit from 12/11/2023 in [facility] Video Visit from 05/20/2023 in [facility] Video Visit from 02/26/2023 in [facility] Video Visit from 12/17/2022 in [facility] Video Visit from 11/14/2022 in [facility]  PHQ-2 Total Score 3 4 3 1 3   PHQ-9 Total Score 12 16 9 5 9    Flowsheet Row Video Visit from 05/20/2023 in [facility] Video Visit from 06/06/2022 in [facility] Admission (Discharged) from 02/19/2022 in [facility] 4E CV SURGICAL PROGRESSIVE CARE  C-SSRS RISK CATEGORY Error: Q7 should not be populated when Q6 is No Low Risk No Risk     Assessment and Plan: Patient endorses increased anxiety and depression.Today patient agreeable to increasing Rexulti  1 mg to 2 mg to help manage mood  and anxiety.  He is also agreeable to increasing hydroxyzine  25 mg 3 times daily to 50 mg 3 times daily as needed to help manage anxiety.  He will continue other medications as prescribed.   1. Generalized anxiety disorder  Continue- mirtazapine  (REMERON ) 45 MG tablet; Take 1 tablet (45 mg total) by mouth at bedtime.  Dispense: 30 tablet; Refill: 3 Increased- hydrOXYzine  (ATARAX ) 50 MG tablet; Take 1 tablet (50 mg total) by mouth 3 (three) times daily as needed.  Dispense: 90  tablet; Refill: 3  2. Mild depression  Continue- mirtazapine  (REMERON ) 45 MG tablet; Take 1 tablet (45 mg total) by mouth at bedtime.  Dispense: 30 tablet; Refill: 3 Continue- melatonin 5 MG TABS; Take 2 tablets (10 mg total) by mouth at bedtime.  Dispense: 30 tablet; Refill: 3 Increased- brexpiprazole  (REXULTI ) 2 MG TABS tablet; Take 1 tablet (2 mg total) by mouth daily.  Dispense: 30 tablet; Refill: 3   Follow up in 2.5 months Follow up with therapy  Zane FORBES Bach, NP 12/11/2023, 10:47 AM

## 2023-12-14 ENCOUNTER — Other Ambulatory Visit: Payer: Self-pay

## 2023-12-17 ENCOUNTER — Encounter: Payer: Self-pay | Admitting: Cardiology

## 2023-12-21 ENCOUNTER — Other Ambulatory Visit: Payer: Self-pay

## 2023-12-27 ENCOUNTER — Other Ambulatory Visit: Payer: Self-pay

## 2023-12-27 ENCOUNTER — Other Ambulatory Visit (HOSPITAL_COMMUNITY): Payer: Self-pay | Admitting: Cardiology

## 2023-12-27 DIAGNOSIS — I513 Intracardiac thrombosis, not elsewhere classified: Secondary | ICD-10-CM

## 2023-12-28 ENCOUNTER — Other Ambulatory Visit: Payer: Self-pay

## 2023-12-28 ENCOUNTER — Encounter

## 2023-12-28 ENCOUNTER — Other Ambulatory Visit (HOSPITAL_COMMUNITY): Payer: Self-pay | Admitting: Cardiology

## 2023-12-28 MED ORDER — WARFARIN SODIUM 5 MG PO TABS
7.5000 mg | ORAL_TABLET | Freq: Every day | ORAL | 3 refills | Status: AC
  Filled 2023-12-28: qty 55, 27d supply, fill #0
  Filled 2024-01-30: qty 55, 27d supply, fill #1
  Filled 2024-02-27: qty 55, 27d supply, fill #2

## 2023-12-28 MED ORDER — VARENICLINE TARTRATE 1 MG PO TABS
1.0000 mg | ORAL_TABLET | Freq: Two times a day (BID) | ORAL | 0 refills | Status: AC
  Filled 2023-12-28: qty 53, fill #0
  Filled 2023-12-28: qty 56, 28d supply, fill #0

## 2023-12-28 NOTE — Telephone Encounter (Signed)
 Refill request for Warfarin:  Last INR was 3.7 on 12/08/23 Next INR due 01/05/24 LOV was 11/25/23  Refill approved.

## 2023-12-31 ENCOUNTER — Ambulatory Visit

## 2024-01-04 LAB — CUP PACEART REMOTE DEVICE CHECK
Battery Remaining Longevity: 150 mo
Battery Remaining Percentage: 100 %
Brady Statistic RV Percent Paced: 0 %
Date Time Interrogation Session: 20251204010300
HighPow Impedance: 79 Ohm
Implantable Lead Connection Status: 753985
Implantable Lead Implant Date: 20211101
Implantable Lead Location: 753860
Implantable Lead Model: 138
Implantable Lead Serial Number: 303500
Implantable Pulse Generator Implant Date: 20211101
Lead Channel Impedance Value: 490 Ohm
Lead Channel Setting Pacing Amplitude: 2.5 V
Lead Channel Setting Pacing Pulse Width: 0.4 ms
Lead Channel Setting Sensing Sensitivity: 0.5 mV
Pulse Gen Serial Number: 209902

## 2024-01-05 ENCOUNTER — Ambulatory Visit: Payer: Self-pay | Admitting: Cardiology

## 2024-01-05 ENCOUNTER — Ambulatory Visit: Attending: Cardiology | Admitting: *Deleted

## 2024-01-05 DIAGNOSIS — Z5181 Encounter for therapeutic drug level monitoring: Secondary | ICD-10-CM

## 2024-01-05 DIAGNOSIS — I513 Intracardiac thrombosis, not elsewhere classified: Secondary | ICD-10-CM

## 2024-01-05 DIAGNOSIS — Z9889 Other specified postprocedural states: Secondary | ICD-10-CM

## 2024-01-05 DIAGNOSIS — Z7901 Long term (current) use of anticoagulants: Secondary | ICD-10-CM

## 2024-01-05 LAB — PROTIME-INR
INR: 6.9 (ref 0.9–1.2)
Prothrombin Time: 73.4 s — ABNORMAL HIGH (ref 9.1–12.0)

## 2024-01-05 LAB — POCT INR: INR: 7 — AB (ref 2.0–3.0)

## 2024-01-05 NOTE — Patient Instructions (Addendum)
 Description   STAT INR-6.9; @1253pm -Spoke with patient and advised not to take any warfarin today, no warfarin tomorrow, no warfarin Thursday then continue 2 tablets daily except 1.5 tablets on Thursdays and Saturdays.  Have a green leafy veggie today and stay consistent with greens and protein drink each week. Recheck INR 1 week.  Coumadin  Clinic [phone] BILL [id number]

## 2024-01-05 NOTE — Progress Notes (Signed)
 STAT INR Reported from [facility]-6.9; @1253pm -Spoke with patient and advised not to take any warfarin today, no warfarin tomorrow, no warfarin Thursday then continue 2 tablets daily except 1.5 tablets on Thursdays and Saturdays. Have a green leafy veggie today and stay consistent with greens and protein drink each week. Recheck INR 1 week.

## 2024-01-06 NOTE — Progress Notes (Signed)
 Remote ICD Transmission

## 2024-01-11 ENCOUNTER — Other Ambulatory Visit: Payer: Self-pay

## 2024-01-13 ENCOUNTER — Ambulatory Visit

## 2024-01-13 DIAGNOSIS — Z7901 Long term (current) use of anticoagulants: Secondary | ICD-10-CM | POA: Diagnosis present

## 2024-01-13 DIAGNOSIS — Z9889 Other specified postprocedural states: Secondary | ICD-10-CM | POA: Insufficient documentation

## 2024-01-13 DIAGNOSIS — Z5181 Encounter for therapeutic drug level monitoring: Secondary | ICD-10-CM | POA: Insufficient documentation

## 2024-01-13 DIAGNOSIS — I513 Intracardiac thrombosis, not elsewhere classified: Secondary | ICD-10-CM | POA: Diagnosis present

## 2024-01-13 LAB — POCT INR: INR: 2.9 (ref 2.0–3.0)

## 2024-01-13 NOTE — Patient Instructions (Signed)
 Description   STAT INR-2.9; Continue 2 tablets daily except 1.5 tablets on Thursdays and Saturdays.  Have a green leafy veggie today and stay consistent with greens and protein drink each week. Recheck INR 3 weeks.  Coumadin  Clinic [phone] BILL [id number]

## 2024-01-13 NOTE — Progress Notes (Signed)
 Description   STAT INR-2.9; Continue 2 tablets daily except 1.5 tablets on Thursdays and Saturdays.  Have a green leafy veggie today and stay consistent with greens and protein drink each week. Recheck INR 3 weeks.  Coumadin  Clinic [phone] BILL [id number]

## 2024-01-17 ENCOUNTER — Other Ambulatory Visit (HOSPITAL_COMMUNITY): Payer: Self-pay | Admitting: Cardiology

## 2024-01-18 ENCOUNTER — Other Ambulatory Visit: Payer: Self-pay

## 2024-01-18 MED ORDER — CARVEDILOL 6.25 MG PO TABS
6.2500 mg | ORAL_TABLET | Freq: Two times a day (BID) | ORAL | 6 refills | Status: AC
  Filled 2024-01-18: qty 60, 30d supply, fill #0
  Filled 2024-02-14: qty 60, 30d supply, fill #1

## 2024-01-25 ENCOUNTER — Other Ambulatory Visit: Payer: Self-pay

## 2024-02-01 ENCOUNTER — Other Ambulatory Visit: Payer: Self-pay

## 2024-02-04 ENCOUNTER — Ambulatory Visit: Attending: Cardiology

## 2024-02-07 ENCOUNTER — Other Ambulatory Visit (HOSPITAL_COMMUNITY): Payer: Self-pay | Admitting: Cardiology

## 2024-02-08 ENCOUNTER — Other Ambulatory Visit: Payer: Self-pay

## 2024-02-08 MED ORDER — FENOFIBRATE 160 MG PO TABS
160.0000 mg | ORAL_TABLET | Freq: Every day | ORAL | 3 refills | Status: AC
  Filled 2024-02-08: qty 30, 30d supply, fill #0

## 2024-02-15 ENCOUNTER — Other Ambulatory Visit: Payer: Self-pay

## 2024-02-19 ENCOUNTER — Ambulatory Visit: Attending: Cardiology

## 2024-02-19 ENCOUNTER — Other Ambulatory Visit: Payer: Self-pay

## 2024-02-19 DIAGNOSIS — Z5181 Encounter for therapeutic drug level monitoring: Secondary | ICD-10-CM | POA: Diagnosis present

## 2024-02-19 DIAGNOSIS — Z7901 Long term (current) use of anticoagulants: Secondary | ICD-10-CM | POA: Insufficient documentation

## 2024-02-19 DIAGNOSIS — I513 Intracardiac thrombosis, not elsewhere classified: Secondary | ICD-10-CM | POA: Diagnosis present

## 2024-02-19 DIAGNOSIS — Z9889 Other specified postprocedural states: Secondary | ICD-10-CM | POA: Insufficient documentation

## 2024-02-19 LAB — POCT INR: INR: 4.3 — AB (ref 2.0–3.0)

## 2024-02-19 NOTE — Progress Notes (Signed)
 INR  4.3  Hold today only then Decrease to 2 tablets daily, except 1.5 tablets every Monday, Wednesday and Friday. Recheck INR 3 weeks.  Coumadin  Clinic [phone] BILL [id number]

## 2024-02-19 NOTE — Patient Instructions (Signed)
 Hold today only then Decrease to 2 tablets daily, except 1.5 tablets every Monday, Wednesday and Friday. Recheck INR 3 weeks.  Coumadin  Clinic [phone] BILL [id number]

## 2024-02-24 ENCOUNTER — Telehealth (INDEPENDENT_AMBULATORY_CARE_PROVIDER_SITE_OTHER): Admitting: Psychiatry

## 2024-02-24 ENCOUNTER — Other Ambulatory Visit: Payer: Self-pay

## 2024-02-24 ENCOUNTER — Encounter (HOSPITAL_COMMUNITY): Payer: Self-pay | Admitting: Psychiatry

## 2024-02-24 ENCOUNTER — Telehealth (HOSPITAL_COMMUNITY): Payer: Self-pay

## 2024-02-24 DIAGNOSIS — F32A Depression, unspecified: Secondary | ICD-10-CM | POA: Diagnosis not present

## 2024-02-24 DIAGNOSIS — F411 Generalized anxiety disorder: Secondary | ICD-10-CM | POA: Diagnosis not present

## 2024-02-24 MED ORDER — HYDROXYZINE HCL 50 MG PO TABS: 50.0000 mg | ORAL_TABLET | Freq: Three times a day (TID) | ORAL | 3 refills | Status: AC | PRN

## 2024-02-24 MED ORDER — BREXPIPRAZOLE 2 MG PO TABS
2.0000 mg | ORAL_TABLET | Freq: Every day | ORAL | 3 refills | Status: AC
  Filled 2024-02-24: qty 30, 30d supply, fill #0

## 2024-02-24 MED ORDER — BUSPIRONE HCL 10 MG PO TABS
10.0000 mg | ORAL_TABLET | Freq: Three times a day (TID) | ORAL | 3 refills | Status: AC
  Filled 2024-02-24: qty 90, 30d supply, fill #0

## 2024-02-24 MED ORDER — MIRTAZAPINE 45 MG PO TABS
45.0000 mg | ORAL_TABLET | Freq: Every day | ORAL | 3 refills | Status: AC
  Filled 2024-02-24: qty 30, 30d supply, fill #0

## 2024-02-24 NOTE — Progress Notes (Signed)
 "   [facility] Clinic Note   PCP: Henry Ingle, MD HF Cardiology: Dr. Rolan  Chief complaint: CHF  HPI: 59 y.o. male smoker was admitted on 08/28/19 for delayed presentation anterior STEMI. Had had >36 hr of chest pain prior to seeking medical attention. Pain progressed to more pleuritic like CP. Found to have anterior ST elevations w/ precordial Q waves on admit. Hs troponin >27,000. Urgent cardiac cath showed totally occluded mid LAD w/o collaterals and 40% mid RCA stenosis. It was suspected he had completed his infarct and residual pleuritic CP was post-MI pericarditis. No intervention was performed. 2D echo demonstrated moderately reduced LVEF, 45-50%, + apical thrombus and moderate AS. MV normal. RV systolic function mildly reduced.    He was placed on medical management for CAD and systolic HF. He was started on ASA, atorvastatin , Losartan  and Coreg . Coumadin  started for apical thrombus w/ heparin  bridge.   Post cath, there were initial concerns for developing cardiogenic shock but he remained stable and did not require pressor/inotropic support. Co-ox remained stable. Repeat echo showed further reduction of LVEF down to 30-35% but no post MI mechanical complications. RV mildly reduced. He did require IV Lasix  for pulmonary edema and volume status and dyspnea improved w/ diuresis. He was continued on GTDMT w/ losartan , spironolactone  and Coreg . BP too soft for Entresto . He was treated w/ colchicine  for post MI pericarditis w/ improvement in pleuritic chest pain. Given anterior MI w/ EF <35%, he was fitted w/ a LifeVest prior to discharge w/ plans to get repeat echo in 1 month.   He had repeat echo in 9/21, showing EF 25-30%.  He had [employer] ICD placed in 11/21.  Echo in 8/22 showed EF 30% with no LV thrombus, normal RV, normal IVC, moderate AS with mean gradient 28 mmHg and AVA 1.08 cm^2. Echo in 12/22 showed EF 30-35%, no LV thrombus, possibly bicuspid aortic valve with  mean gradient 34 mmHg and AVA 1.02 cm^2, normal RV, IVC normal.   Echo was done in 8/23 showing LV thrombus.  INR goal on warfarin was increased to 2.5-3.5.  Echo 10/23 showed EF remains 25-30% with peri-apical akinesis and a chronic-appearing LV thrombus, severe low flow/low gradient aortic stenosis (mean gradient 27 mmHg with AVA 0.88 cm^2), bicuspid aortic valve, normal RV, normal IVC. INR increased to 3-3.5.  We have discussed barostimulation activation therapy, but Medicaid does not have good coverage for this.   Follow up 10/23, continued with NYHA II-III symptoms. Referred to structural heart team for TAVR work up, underwent L/RHC showing chronic occlusion of mLAD without target and moderate RCA stenosis, severe AS and normal right heart pressures. Decided SAVR was best option with LV thrombus.  Seen in ED 01/11/22 with acute ETOH intoxication and LOC. BP low, given IVF with improvement. Discharged home.  In 1/24, he had AVR with St Jude mechanical AoV.  Post-op, he was taken off dapagliflozin , Entresto , and spironolactone . He quit smoking a couple of weeks before AVR.   Echo in 10/24 showed EF 35-40% with aneurysmal apex, mild LVH, normal RV, mechanical aortic valve functioning normally.   Echo 10/25: EF 35-40% with peri-apical akinesis, normal RV, mechanical aortic valve functioning normally, IVC normal.   Today he returns for HF follow up. He is still smoking.  Weight up, but he has been exercising regularly.  He walks on the treadmill for 30-45 minutes. Occasional lightheadedness if standing up too quickly. No chest pain.  No dyspnea except with heavy exertion.  No BRBPR/melena.   Here today for CHF visit.  [employer] device interrogation (personally reviewed):   Labs (1/24): K 3.9, creatinine 1.08 Labs (3/24): K 4.2, SCr 1.18 Labs (5/24): K 4.6, creatinine 1.04, hgb 11.9 Labs (8/24): LDL 46, TGs 368, hgb 14.9 Labs (9/24): K 4.3, creatinine 1.13 Labs (10/24): LDL 44, TGs  103, K 4.3, creatinine 1.13 Labs (5/25): LDL 63 Labs (6/25): K 4.2, creatinine 1.25 Labs (7/25): LDL 58, TGs 199 Labs (8/25): K 3.9, creatinine 1.33  PMH: 1. GERD 2. Depression  3. CAD: Anterior STEMI in 8/21 with totally occluded LAD/no collaterals, 40% mid RCA.  Due to late presentation, no PCI was done.  - Had post-infarct pericarditis.  - LHC (11/23): mLAD 100% stenosed, pRCA 50% stenosed 4. Chronic systolic CHF: Ischemic cardiomyopathy. [employer] ICD.  - Echo (9/21) with EF 25-30%, normal RV, moderate AS with mean gradient 22 mmHg.  - Echo (8/22): EF 30% with no LV thrombus, normal RV, normal IVC, moderate AS with mean gradient 28 mmHg and AVA 1.08 cm^2.  - Echo (12/22): EF 30-35%, no LV thrombus, possibly bicuspid aortic valve with mean gradient 34 mmHg and AVA 1.02 cm^2, normal RV, IVC normal.  - Echo (10/23): EF remains 25-30% with peri-apical akinesis and a chronic-appearing LV thrombus, severe low flow/low gradient aortic stenosis (mean gradient 27 mmHg with AVA 0.88 cm^2), bicuspid aortic valve, normal RV, normal IVC. - R/LHC (11/23): mLAD 100% stenosed, pRCA 50% stenosed; RA mean 3, PA 25/14 mean 18, PCWP 13, CO/CI (Fick) 5.03/2.54 - TEE 1/24 EF 20-30%, normal RV - Echo (10/24): EF 35-40% with aneurysmal apex, mild LVH, normal RV, mechanical aortic valve functioning normally.  - Echo (10/25): EF 35-40% with peri-apical akinesis, normal RV, mechanical aortic valve functioning normally, IVC normal.  5. LV thrombus: Apical thrombus noted on echo at time of MI in 8/21. Resolved by 9/21 echo.  - Echo 10/23 with chronic-appearing, immobile LV thrombus.  6. Aortic stenosis: Bicuspid valve. Moderate on 12/22 echo.  - 10/23 echo showed low flow/low gradient severe AS.   - St Jude mechanical AVR in 1/24 7. Prior smoker.   Current Outpatient Medications  Medication Sig Dispense Refill   acetaminophen  (TYLENOL ) 500 MG tablet Take 500 mg by mouth every 6 (six) hours as needed for  headache.     aspirin  81 MG chewable tablet Chew 1 tablet (81 mg total) by mouth daily. 90 tablet 3   atorvastatin  (LIPITOR ) 80 MG tablet Take 1 tablet (80 mg total) by mouth daily. 90 tablet 3   brexpiprazole  (REXULTI ) 2 MG TABS tablet Take 1 tablet (2 mg total) by mouth daily. 30 tablet 3   busPIRone  (BUSPAR ) 10 MG tablet Take 1 tablet (10 mg total) by mouth 3 (three) times daily. 90 tablet 3   carvedilol  (COREG ) 6.25 MG tablet Take 1 tablet (6.25 mg total) by mouth 2 (two) times daily. 60 tablet 6   dapagliflozin  propanediol (FARXIGA ) 10 MG TABS tablet Take 1 tablet (10 mg total) by mouth daily before breakfast. 30 tablet 11   digoxin  (LANOXIN ) 0.125 MG tablet Take 1/2 tablet (0.0625 mg total) by mouth daily. 45 tablet 3   ezetimibe  (ZETIA ) 10 MG tablet Take 1 tablet (10 mg total) by mouth daily. 90 tablet 3   fenofibrate  160 MG tablet Take 1 tablet (160 mg total) by mouth daily. 30 tablet 3   furosemide  (LASIX ) 20 MG tablet Take 2 tablets (40 mg total) by mouth daily. 180 tablet 3  hydrOXYzine  (ATARAX ) 50 MG tablet Take 1 tablet (50 mg total) by mouth 3 (three) times daily as needed. 90 tablet 3   levothyroxine  (SYNTHROID ) 50 MCG tablet Take 1 tablet (50 mcg total) by mouth daily. (Patient not taking: Reported on 11/25/2023) 90 tablet 0   levothyroxine  (SYNTHROID ) 75 MCG tablet Take 1 tablet (75 mcg total) by mouth daily. 90 tablet 0   melatonin 5 MG TABS Take 2 tablets (10 mg total) by mouth at bedtime. 30 tablet 3   mirtazapine  (REMERON ) 45 MG tablet Take 1 tablet (45 mg total) by mouth at bedtime. 30 tablet 3   Multiple Vitamin (MULTIVITAMIN WITH MINERALS) TABS tablet Take 1 tablet by mouth daily.     omega-3 acid ethyl esters (LOVAZA ) 1 g capsule Take 2 capsules (2 g total) by mouth 2 (two) times daily. 360 capsule 3   omeprazole  (PRILOSEC ) 20 MG capsule Take 20 mg by mouth daily before breakfast.     Probiotic Product (PROBIOTIC DAILY PO) Take 1 capsule by mouth with breakfast, with  lunch, and with evening meal.     sacubitril -valsartan  (ENTRESTO ) 97-103 MG Take 1 tablet by mouth 2 (two) times daily. 60 tablet 11   SILDENAFIL CITRATE PO Take 45-90 mg by mouth daily as needed (ED). Take 1 - 2 tablets 30 to 45 min before sexual activity as needed     spironolactone  (ALDACTONE ) 50 MG tablet Take 1 tablet (50 mg total) by mouth daily. 30 tablet 6   varenicline  (CHANTIX ) 1 MG tablet Take 1 tablet (1 mg total) by mouth 2 (two) times daily. 56 tablet 0   warfarin (COUMADIN ) 5 MG tablet Take 1.5 tablets to 2 tablets by mouth once daily as directed by Anticoagulation clinic 55 tablet 3   No current facility-administered medications for this visit.   No Known Allergies  Social History   Socioeconomic History   Marital status: Married    Spouse name: Not on file   Number of children: 0   Years of education: Not on file   Highest education level: Not on file  Occupational History   Occupation: clinical research associate   Occupation: Attorney/Writer  Tobacco Use   Smoking status: Former    Current packs/day: 0.00    Average packs/day: 0.3 packs/day    Types: Cigarettes    Quit date: 02/15/2022    Years since quitting: 2.0   Smokeless tobacco: Never   Tobacco comments:    x15 yrs as of 2021  Vaping Use   Vaping status: Never Used  Substance and Sexual Activity   Alcohol use: Not Currently    Comment: a couple of drinks on the weekends   Drug use: Never   Sexual activity: Yes    Birth control/protection: None  Other Topics Concern   Not on file  Social History Narrative   2 adopted children   Social Drivers of Health   Tobacco Use: Medium Risk (02/24/2024)   Patient History    Smoking Tobacco Use: Former    Smokeless Tobacco Use: Never    Passive Exposure: Not on Actuary Strain: Not on file  Food Insecurity: No Food Insecurity (02/20/2022)   Hunger Vital Sign    Worried About Running Out of Food in the Last Year: Never true    Ran Out of Food in the Last  Year: Never true  Transportation Needs: No Transportation Needs (02/20/2022)   PRAPARE - Transportation    Lack of Transportation (Medical): No    Lack  of Transportation (Non-Medical): No  Physical Activity: Not on file  Stress: Not on file  Social Connections: Not on file  Intimate Partner Violence: Not At Risk (02/20/2022)   Humiliation, Afraid, Rape, and Kick questionnaire    Fear of Current or Ex-Partner: No    Emotionally Abused: No    Physically Abused: No    Sexually Abused: No  Depression (PHQ2-9): High Risk (02/24/2024)   Depression (PHQ2-9)    PHQ-2 Score: 16  Alcohol Screen: Medium Risk (05/20/2023)   Alcohol Screen    Last Alcohol Screening Score (AUDIT): 10  Housing: Low Risk (02/20/2022)   Housing    Last Housing Risk Score: 0  Utilities: Not At Risk (02/20/2022)   AHC Utilities    Threatened with loss of utilities: No  Health Literacy: Not on file   Family History  Problem Relation Age of Onset   Cancer Mother        peritoneal   Arrhythmia Father    Prostate cancer Father    Colon cancer Brother 10   Cancer Maternal Grandmother        type unknown   Heart attack Maternal Grandfather    Diabetes Paternal Grandmother    Heart attack Paternal Grandfather    There were no vitals taken for this visit.  Wt Readings from Last 3 Encounters:  11/25/23 96.2 kg (212 lb)  10/07/23 92.2 kg (203 lb 3.2 oz)  08/18/23 92.5 kg (204 lb)   PHYSICAL EXAM: General: NAD Neck: No JVD, no thyromegaly or thyroid nodule.  Lungs: Clear to auscultation bilaterally with normal respiratory effort. CV: Nondisplaced PMI.  Heart regular S1/S2 with mechanical S2, no S3/S4, 1/6 SEM RUSB.  No peripheral edema.  No carotid bruit.  Normal pedal pulses.  Abdomen: Soft, nontender, no hepatosplenomegaly, no distention.  Skin: Intact without lesions or rashes.  Neurologic: Alert and oriented x 3.  Psych: Normal affect. Extremities: No clubbing or cyanosis.  HEENT: Normal.   ASSESSMENT &  PLAN: 1. CAD: Admission 8/21 for acute anterior MI, delayed presentation with no ischemic pain, had pleuritic-type chest pain (post-MI pericarditis). LHC with totally occluded mid LAD w/o collaterals. Suspected completed MI, no intervention.  Repeat LHC in 11/23 with mLAD 100% stenosed, pRCA 50% stenosed.  No chest pain.  - Continue ASA 81 daily.   - Continue atorvastatin  80 mg daily and Zetia  10 daily, good lipids in 7/25.   2. Chronic systolic CHF: Ischemic cardiomyopathy.  Reduced EF post anterior MI.  Echo in 9/21 with EF 25-30%, peri-apical akinesis, moderate AS. Cath w/ occluded mLAD but no intervention as he had completed his infarct. He has [employer] ICD.  Echo in 8/22 with EF 30%.  Echo in 12/22 with EF 30-35%, normal RV, moderate AS. Echo 10/23 showed EF remains 25-30% with peri-apical akinesis and a chronic-appearing LV thrombus, severe low flow/low gradient aortic stenosis (mean gradient 27 mmHg with AVA 0.88 cm^2), bicuspid aortic valve, normal RV, normal IVC.  Now s/p mechanical AVR.  TEE 1/24 EF 20-30%, normal RV.  Echo 10/24 and again in 10/25 showed EF 35-40% with aneurysmal apex, mild LVH, normal RV, mechanical aortic valve functioning normally. NYHA class I with no volume overload by exam or [facility].   - Continue Lasix  40 mg daily.  BMET/BNP today. - Continue Entresto  97/103 bid. - Continue spironolactone  50 mg daily.  - Continue Coreg  6.25 mg bid.  - Continue dapagliflozin  10 mg daily.  - Continue digoxin  0.0625, check level today.  3. Post  MI Pericarditis: In 8/21.   4. LV thrombus: Large LV thrombus noted on echo in 8/21, resolved on 9/21 echo.  Thrombus had recurred on 8/23 echo and warfarin was increased to INR goal 2.5-3.5 and ASA 81 was added.  No thrombus seen on echo 10/25. - Continue warfarin with INR goal 2.5-3.5 and continue ASA 81 daily.  5. Tobacco use: He is back smoking 1/2 ppd. Had success in the past with Chantix . - He is willing to try Chantix  again.   6. Hyperlipidemia: LDL goal < 55. Good lipids in 7/25.  - Continue atorvastatin  80 mg daily. - Continue Zetia  10 mg daily. - Continue fenofibrate  160 mg daily. - Continue Lovaza  2 g bid. 7. Aortic stenosis: Bicuspid aortic valve with moderate AS on 12/22 echo. Echo in 10/23 showed severe low flow/low gradient aortic stenosis with mean gradient 27 mmHg and AVA 0.88 cm^2.  He had SAVR 02/19/22 with Dr. Maryjane with St Jude mechanical aortic valve. AV mean gradient 12 mmHg.  - Continue ASA 81 + warfarin (INR goal 2.5-3.5).  8. Alcohol abuse: He has quit. ***.  Follow up  [facility], Yon Schiffman N, PA-C  02/24/2024 "

## 2024-02-24 NOTE — Telephone Encounter (Signed)
 Called to confirm/remind patient of their appointment at the [facility] on 02/24/24.   Appointment:   [x] Confirmed  [] Left mess   [] No answer/No voice mail  [] VM Full/unable to leave message  [] Phone not in service  Patient reminded to bring all medications and/or complete list.  Confirmed patient has transportation. Gave directions, instructed to utilize valet parking.

## 2024-02-24 NOTE — Progress Notes (Signed)
 [facility] MD/PA/NP OP Progress Note Virtual Visit via Video Note  I connected with Craig Flores on 02/24/24 at 10:30 AM EST by a video enabled telemedicine application and verified that I am speaking with the correct person using two identifiers.  Location: Patient: home Provider: Clinic   I discussed the limitations of evaluation and management by telemedicine and the availability of in person appointments. The patient expressed understanding and agreed to proceed.  I provided 30 minutes of non-face-to-face time during this encounter.       02/24/2024 10:44 AM Craig Flores  MRN:  [id number]  Chief Complaint: I feel fatigue and I'm dragging  HPI:  59 year old male seen today for follow up psychiatric evaluation. He has a psychiatric history of alcohol dependence, substance induced mood disorder, anxiety, depression, and SI. He is currently being managed on  hydroxyzine  50 mg three times daily as needed, Rexulti  2 mg daily, and Mirtazapine  45 mg at nightly.  Patient also takes melatonin 5 mg provides it over-the-counter.  He notes that his medications are somewhat effective in managing his psychiatric conditions.   Today he well pleasant, cooperative, engaged in conversation.  He informed clinical research associate that at times he feels fatigue and notes that at times he feels like he is dragging.  He continues to note that he feels on edge.  Patient also informed writer that at times he gets nervous as he is not as alert and as sharp as he once was.  Patient continues to worry about his health.  He informed clinical research associate that he is uncertain how his cardiac health is.  He notes that he feels that is declining as she is napping longer during the day.  He also notes that he is put on 20 pounds.    Patient reports the above exacerbates his anxiety and depression.  Today provider conducted GAD-7 and patient scored a 14, at his last visit patient scored a 14.  Provider also conducted PHQ-9 and patient scored a 16, at  his last visit he scored a 12.  Patient endorses increased sleep and appetite.  Today he denies SI/HI/AVH, mania, paranoia.    Patient denies alcohol, tobacco, or illegal drug use.    Today patient agreeable to starting BuSpar  10 mg 3 times daily to help manage anxiety and depression.  He will continue other medications as prescribed. Potential side effects of medication and risks vs benefits of treatment vs non-treatment were explained and discussed. All questions were answered.No other concerns noted at this time.   Visit Diagnosis:    ICD-10-CM   1. Mild depression  F32.A busPIRone  (BUSPAR ) 10 MG tablet    brexpiprazole  (REXULTI ) 2 MG TABS tablet    mirtazapine  (REMERON ) 45 MG tablet    2. Generalized anxiety disorder  F41.1 busPIRone  (BUSPAR ) 10 MG tablet    mirtazapine  (REMERON ) 45 MG tablet    hydrOXYzine  (ATARAX ) 50 MG tablet            Past Psychiatric History: alcohol dependence, substance induced mood disorder, anxiety, depression, and SI.   Past Medical History:  Past Medical History:  Diagnosis Date   Acid reflux    AICD (automatic cardioverter/defibrillator) present    Anxiety    Aortic stenosis with bicuspid valve    CHF (congestive heart failure) (HCC)    Depression    Dyspnea    GERD (gastroesophageal reflux disease)    HLD (hyperlipidemia)    Ischemic cardiomyopathy    Migraines    Myocardial infarction (HCC)  08/28/2019    Past Surgical History:  Procedure Laterality Date   AORTIC VALVE REPLACEMENT N/A 02/19/2022   Procedure: AORTIC VALVE REPLACEMENT (AVR) 23mm SJM REGENT MECHANICAL AORTIC VALVE;  Surgeon: Maryjane Mt, MD;  Location: [facility] OR;  Service: Open Heart Surgery;  Laterality: N/A;   COLONOSCOPY WITH PROPOFOL  N/A 07/16/2021   Procedure: COLONOSCOPY WITH PROPOFOL ;  Surgeon: Avram Lupita BRAVO, MD;  Location: [facility];  Service: Gastroenterology;  Laterality: N/A;   CORONARY/GRAFT ACUTE MI REVASCULARIZATION N/A 08/28/2019   Procedure:  Coronary/Graft Acute MI Revascularization;  Surgeon: Court Dorn PARAS, MD;  Location: [facility] INVASIVE CV LAB;  Service: Cardiovascular;  Laterality: N/A;   ICD IMPLANT N/A 11/28/2019   Procedure: ICD IMPLANT;  Surgeon: Fernande Elspeth BROCKS, MD;  Location: [facility];  Service: Cardiovascular;  Laterality: N/A;   LEFT HEART CATH AND CORONARY ANGIOGRAPHY N/A 08/28/2019   Procedure: LEFT HEART CATH AND CORONARY ANGIOGRAPHY;  Surgeon: Court Dorn PARAS, MD;  Location: [facility] INVASIVE CV LAB;  Service: Cardiovascular;  Laterality: N/A;   POLYPECTOMY  07/16/2021   Procedure: POLYPECTOMY;  Surgeon: Avram Lupita BRAVO, MD;  Location: [facility];  Service: Gastroenterology;;   RIGHT HEART CATH AND CORONARY ANGIOGRAPHY N/A 12/12/2021   Procedure: RIGHT HEART CATH AND CORONARY ANGIOGRAPHY;  Surgeon: Verlin Lonni BIRCH, MD;  Location: [facility] INVASIVE CV LAB;  Service: Cardiovascular;  Laterality: N/A;   TEE WITHOUT CARDIOVERSION N/A 02/19/2022   Procedure: TRANSESOPHAGEAL ECHOCARDIOGRAM (TEE);  Surgeon: Maryjane Mt, MD;  Location: [facility] OR;  Service: Open Heart Surgery;  Laterality: N/A;   WISDOM TOOTH EXTRACTION      Family Psychiatric History: Notes that his mother and aunt had mental health issues. Notes he belives they had anxiety  Family History:  Family History  Problem Relation Age of Onset   Cancer Mother        peritoneal   Arrhythmia Father    Prostate cancer Father    Colon cancer Brother 34   Cancer Maternal Grandmother        type unknown   Heart attack Maternal Grandfather    Diabetes Paternal Grandmother    Heart attack Paternal Grandfather     Social History:  Social History   Socioeconomic History   Marital status: Married    Spouse name: Not on file   Number of children: 0   Years of education: Not on file   Highest education level: Not on file  Occupational History   Occupation: clinical research associate   Occupation: Attorney/Writer  Tobacco Use   Smoking status: Former    Current  packs/day: 0.00    Average packs/day: 0.3 packs/day    Types: Cigarettes    Quit date: 02/15/2022    Years since quitting: 2.0   Smokeless tobacco: Never   Tobacco comments:    x15 yrs as of 2021  Vaping Use   Vaping status: Never Used  Substance and Sexual Activity   Alcohol use: Not Currently    Comment: a couple of drinks on the weekends   Drug use: Never   Sexual activity: Yes    Birth control/protection: None  Other Topics Concern   Not on file  Social History Narrative   2 adopted children   Social Drivers of Health   Tobacco Use: Medium Risk (02/24/2024)   Patient History    Smoking Tobacco Use: Former    Smokeless Tobacco Use: Never    Passive Exposure: Not on Actuary Strain: Not on file  Food Insecurity: No  Food Insecurity (02/20/2022)   Hunger Vital Sign    Worried About Running Out of Food in the Last Year: Never true    Ran Out of Food in the Last Year: Never true  Transportation Needs: No Transportation Needs (02/20/2022)   PRAPARE - Administrator, Civil Service (Medical): No    Lack of Transportation (Non-Medical): No  Physical Activity: Not on file  Stress: Not on file  Social Connections: Not on file  Depression (PHQ2-9): High Risk (02/24/2024)   Depression (PHQ2-9)    PHQ-2 Score: 16  Alcohol Screen: Medium Risk (05/20/2023)   Alcohol Screen    Last Alcohol Screening Score (AUDIT): 10  Housing: Low Risk (02/20/2022)   Housing    Last Housing Risk Score: 0  Utilities: Not At Risk (02/20/2022)   AHC Utilities    Threatened with loss of utilities: No  Health Literacy: Not on file    Allergies: No Known Allergies  Metabolic Disorder Labs: Lab Results  Component Value Date   HGBA1C 5.6 08/18/2023   MPG 114.02 08/18/2023   MPG 119.76 02/17/2022   No results found for: PROLACTIN Lab Results  Component Value Date   CHOL 136 08/18/2023   TRIG 199 (H) 08/18/2023   HDL 38 (L) 08/18/2023   CHOLHDL 3.6 08/18/2023   VLDL  40 08/18/2023   LDLCALC 58 08/18/2023   LDLCALC 63 06/18/2023   Lab Results  Component Value Date   TSH 3.181 08/28/2019    Therapeutic Level Labs: No results found for: LITHIUM No results found for: VALPROATE No results found for: CBMZ  Current Medications: Current Outpatient Medications  Medication Sig Dispense Refill   busPIRone  (BUSPAR ) 10 MG tablet Take 1 tablet (10 mg total) by mouth 3 (three) times daily. 90 tablet 3   acetaminophen  (TYLENOL ) 500 MG tablet Take 500 mg by mouth every 6 (six) hours as needed for headache.     aspirin  81 MG chewable tablet Chew 1 tablet (81 mg total) by mouth daily. 90 tablet 3   atorvastatin  (LIPITOR ) 80 MG tablet Take 1 tablet (80 mg total) by mouth daily. 90 tablet 3   brexpiprazole  (REXULTI ) 2 MG TABS tablet Take 1 tablet (2 mg total) by mouth daily. 30 tablet 3   carvedilol  (COREG ) 6.25 MG tablet Take 1 tablet (6.25 mg total) by mouth 2 (two) times daily. 60 tablet 6   dapagliflozin  propanediol (FARXIGA ) 10 MG TABS tablet Take 1 tablet (10 mg total) by mouth daily before breakfast. 30 tablet 11   digoxin  (LANOXIN ) 0.125 MG tablet Take 1/2 tablet (0.0625 mg total) by mouth daily. 45 tablet 3   ezetimibe  (ZETIA ) 10 MG tablet Take 1 tablet (10 mg total) by mouth daily. 90 tablet 3   fenofibrate  160 MG tablet Take 1 tablet (160 mg total) by mouth daily. 30 tablet 3   furosemide  (LASIX ) 20 MG tablet Take 2 tablets (40 mg total) by mouth daily. 180 tablet 3   hydrOXYzine  (ATARAX ) 50 MG tablet Take 1 tablet (50 mg total) by mouth 3 (three) times daily as needed. 90 tablet 3   levothyroxine  (SYNTHROID ) 50 MCG tablet Take 1 tablet (50 mcg total) by mouth daily. (Patient not taking: Reported on 11/25/2023) 90 tablet 0   levothyroxine  (SYNTHROID ) 75 MCG tablet Take 1 tablet (75 mcg total) by mouth daily. 90 tablet 0   melatonin 5 MG TABS Take 2 tablets (10 mg total) by mouth at bedtime. 30 tablet 3   mirtazapine  (REMERON ) 45 MG  tablet Take 1  tablet (45 mg total) by mouth at bedtime. 30 tablet 3   Multiple Vitamin (MULTIVITAMIN WITH MINERALS) TABS tablet Take 1 tablet by mouth daily.     omega-3 acid ethyl esters (LOVAZA ) 1 g capsule Take 2 capsules (2 g total) by mouth 2 (two) times daily. 360 capsule 3   omeprazole  (PRILOSEC ) 20 MG capsule Take 20 mg by mouth daily before breakfast.     Probiotic Product (PROBIOTIC DAILY PO) Take 1 capsule by mouth with breakfast, with lunch, and with evening meal.     sacubitril -valsartan  (ENTRESTO ) 97-103 MG Take 1 tablet by mouth 2 (two) times daily. 60 tablet 11   SILDENAFIL CITRATE PO Take 45-90 mg by mouth daily as needed (ED). Take 1 - 2 tablets 30 to 45 min before sexual activity as needed     spironolactone  (ALDACTONE ) 50 MG tablet Take 1 tablet (50 mg total) by mouth daily. 30 tablet 6   varenicline  (CHANTIX ) 1 MG tablet Take 1 tablet (1 mg total) by mouth 2 (two) times daily. 56 tablet 0   warfarin (COUMADIN ) 5 MG tablet Take 1.5 tablets to 2 tablets by mouth once daily as directed by Anticoagulation clinic 55 tablet 3   No current facility-administered medications for this visit.     Musculoskeletal: Strength & Muscle Tone: Normal, telehealth visit Gait & Station: normal, telehealth visit Patient leans: N/A  Psychiatric Specialty Exam: Review of Systems  There were no vitals taken for this visit.There is no height or weight on file to calculate BMI.  General Appearance: Well Groomed  Eye Contact:  Good  Speech:  Clear and Coherent and Normal Rate  Volume:  Normal  Mood:  Anxious and Depressed  Affect:  Appropriate and Congruent  Thought Process:  Coherent, Goal Directed and Linear  Orientation:  Full (Time, Place, and Person)  Thought Content: WDL and Logical   Suicidal Thoughts:  No  Homicidal Thoughts:  No  Memory:  Immediate;   Good Recent;   Good Remote;   Good  Judgement:  Good  Insight:  Good  Psychomotor Activity:  Normal  Concentration:  Concentration: Good  and Attention Span: Good  Recall:  Good  Fund of Knowledge: Good  Language: Good  Akathisia:  No  Handed:  Right  AIMS (if indicated): Not done  Assets:  Communication Skills Desire for Improvement Financial Resources/Insurance Housing Intimacy Social Support  ADL's:  Intact  Cognition: WNL  Sleep:  Fair   Screenings: AIMS    Flowsheet Row [facility]  AIMS Total Score 0   AUDIT    Flowsheet Row Video Visit from 05/20/2023 in [facility] Ad[facility] (Discharged[facility]  Alcohol Use Disorder Identification Test Final Score (AUDIT) 10 19   GAD-7    Flowsheet Row Video Visit from 02/24/2024 in [facility] Video Visit from 12/11/2023 in [facility] Video Visit from 05/20/2023 in [facility] Video Visit from 02/26/2023 in [facility] Video Visit from 12/17/2022 in [facility]  Total GAD-7 Score 14 14 13 10 6    PHQ2-9    Flowsheet Row Video Visit from 02/24/2024 in [facility] Video Visit from 12/11/2023 in [facility] Video Visit from 05/20/2023 in [facility] Video Visit from 02/26/2023 in [facility] Video Visit from 12/17/2022 in [facility]  PHQ-2 Total Score 3 3 4 3 1   PHQ-9 Total Score 16 12 16 9 5    Flowsheet Row Video Visit from 05/20/2023 in [facility] Video Visit from 06/06/2022 in [facility] Admission (Discharged) from 02/19/2022 in [facility] CV SURGICAL PROGRESSIVE CARE  C-SSRS RISK CATEGORY Error: Q7 should not be populated when Q6 is No Low Risk No Risk     Assessment and  Plan: Patient endorses hypersomnia, anxiety, and depression. Today patient agreeable to starting BuSpar  10 mg 3 times daily to help manage anxiety and depression.  He will continue other medications as prescribed.  1. Mild depression  Start- busPIRone  (BUSPAR ) 10 MG tablet; Take 1 tablet (10 mg total) by mouth 3 (three) times daily.  Dispense: 90 tablet; Refill: 3 Continue- brexpiprazole  (REXULTI ) 2 MG TABS tablet; Take 1 tablet (2 mg total) by mouth daily.  Dispense: 30 tablet; Refill: 3 Continue- mirtazapine  (REMERON ) 45 MG tablet; Take 1 tablet (45 mg total) by mouth at bedtime.  Dispense: 30 tablet; Refill: 3  2. Generalized anxiety disorder  Start- busPIRone  (BUSPAR ) 10 MG tablet; Take 1 tablet (10 mg total) by mouth 3 (three) times daily.  Dispense: 90 tablet; Refill: 3 Continue- mirtazapine  (REMERON ) 45 MG tablet; Take 1 tablet (45 mg total) by mouth at bedtime.  Dispense: 30 tablet; Refill: 3 Continue- hydrOXYzine  (ATARAX ) 50 MG tablet; Take 1 tablet (50 mg total) by mouth 3 (three) times daily as needed.  Dispense: 90 tablet; Refill: 3  Follow up in 2.5 months Follow up with therapy  Zane FORBES Bach, NP 02/24/2024, 10:44 AM

## 2024-02-25 ENCOUNTER — Ambulatory Visit (HOSPITAL_COMMUNITY)
Admission: RE | Admit: 2024-02-25 | Discharge: 2024-02-25 | Disposition: A | Discharge status: Home / Self Care | Source: Ambulatory Visit | Attending: Physician Assistant | Admitting: Physician Assistant

## 2024-02-25 VITALS — BP 120/74 | HR 80 | Wt 225.4 lb

## 2024-02-25 DIAGNOSIS — I252 Old myocardial infarction: Secondary | ICD-10-CM | POA: Diagnosis not present

## 2024-02-25 DIAGNOSIS — Z720 Tobacco use: Secondary | ICD-10-CM

## 2024-02-25 DIAGNOSIS — I2582 Chronic total occlusion of coronary artery: Secondary | ICD-10-CM | POA: Diagnosis not present

## 2024-02-25 DIAGNOSIS — Z79899 Other long term (current) drug therapy: Secondary | ICD-10-CM | POA: Insufficient documentation

## 2024-02-25 DIAGNOSIS — I2510 Atherosclerotic heart disease of native coronary artery without angina pectoris: Secondary | ICD-10-CM | POA: Diagnosis not present

## 2024-02-25 DIAGNOSIS — F32A Depression, unspecified: Secondary | ICD-10-CM | POA: Diagnosis not present

## 2024-02-25 DIAGNOSIS — F1011 Alcohol abuse, in remission: Secondary | ICD-10-CM | POA: Diagnosis not present

## 2024-02-25 DIAGNOSIS — I255 Ischemic cardiomyopathy: Secondary | ICD-10-CM | POA: Insufficient documentation

## 2024-02-25 DIAGNOSIS — Z952 Presence of prosthetic heart valve: Secondary | ICD-10-CM | POA: Insufficient documentation

## 2024-02-25 DIAGNOSIS — F419 Anxiety disorder, unspecified: Secondary | ICD-10-CM | POA: Insufficient documentation

## 2024-02-25 DIAGNOSIS — Q2381 Bicuspid aortic valve: Secondary | ICD-10-CM | POA: Insufficient documentation

## 2024-02-25 DIAGNOSIS — E785 Hyperlipidemia, unspecified: Secondary | ICD-10-CM | POA: Insufficient documentation

## 2024-02-25 DIAGNOSIS — I5022 Chronic systolic (congestive) heart failure: Secondary | ICD-10-CM | POA: Insufficient documentation

## 2024-02-25 DIAGNOSIS — Z7982 Long term (current) use of aspirin: Secondary | ICD-10-CM | POA: Diagnosis not present

## 2024-02-25 DIAGNOSIS — Z4502 Encounter for adjustment and management of automatic implantable cardiac defibrillator: Secondary | ICD-10-CM | POA: Insufficient documentation

## 2024-02-25 DIAGNOSIS — R5383 Other fatigue: Secondary | ICD-10-CM | POA: Diagnosis not present

## 2024-02-25 DIAGNOSIS — Z7901 Long term (current) use of anticoagulants: Secondary | ICD-10-CM | POA: Diagnosis not present

## 2024-02-25 DIAGNOSIS — R9431 Abnormal electrocardiogram [ECG] [EKG]: Secondary | ICD-10-CM | POA: Insufficient documentation

## 2024-02-25 DIAGNOSIS — F17210 Nicotine dependence, cigarettes, uncomplicated: Secondary | ICD-10-CM | POA: Diagnosis not present

## 2024-02-25 LAB — BASIC METABOLIC PANEL WITH GFR
Anion gap: 12 (ref 5–15)
BUN: 25 mg/dL — ABNORMAL HIGH (ref 6–20)
CO2: 24 mmol/L (ref 22–32)
Calcium: 9.7 mg/dL (ref 8.9–10.3)
Chloride: 99 mmol/L (ref 98–111)
Creatinine, Ser: 1.37 mg/dL — ABNORMAL HIGH (ref 0.61–1.24)
GFR, Estimated: 60 mL/min — ABNORMAL LOW
Glucose, Bld: 109 mg/dL — ABNORMAL HIGH (ref 70–99)
Potassium: 4.5 mmol/L (ref 3.5–5.1)
Sodium: 135 mmol/L (ref 135–145)

## 2024-02-25 LAB — T4, FREE: Free T4: 1.18 ng/dL (ref 0.80–2.00)

## 2024-02-25 LAB — TSH: TSH: 8.44 u[IU]/mL — ABNORMAL HIGH (ref 0.350–4.500)

## 2024-02-25 LAB — PRO BRAIN NATRIURETIC PEPTIDE: Pro Brain Natriuretic Peptide: <50 pg/mL

## 2024-02-25 NOTE — Patient Instructions (Addendum)
 Thank you for coming in today  If you had labs drawn today, any labs that are abnormal the clinic will call you No news is good news  You have been referred to , their office will call you for further appointment details    Medications: No changes  Follow up appointments:  Your physician recommends that you schedule a follow-up appointment in:  2 months With Dr. Mclean    Do the following things EVERYDAY: Weigh yourself in the morning before breakfast. Write it down and keep it in a log. Take your medicines as prescribed Eat low salt foods--Limit salt (sodium) to 2000 mg per day.  Stay as active as you can everyday Limit all fluids for the day to less than 2 liters   At the [facility], you and your health needs are our priority. As part of our continuing mission to provide you with exceptional heart care, we have created designated Provider Care Teams. These Care Teams include your primary Cardiologist (physician) and Advanced Practice Providers (APPs- Physician Assistants and Nurse Practitioners) who all work together to provide you with the care you need, when you need it.   You may see any of the following providers on your designated Care Team at your next follow up: Dr Toribio Fuel Dr Ezra Shuck Dr. Ria Gardenia Greig Lenetta, NP Caffie Shed, [place] [facility] [place], [place] Beckey Coe, NP Tinnie Redman, PharmD   Please be sure to bring in all your medications bottles to every appointment.    Thank you for choosing [facility]  If you have any questions or concerns before your next appointment please send us  a message through [place] or call our office at [phone].    TO LEAVE A MESSAGE FOR THE NURSE SELECT OPTION 2, PLEASE LEAVE A MESSAGE INCLUDING: YOUR NAME DATE OF BIRTH CALL BACK NUMBER REASON FOR CALL**this is important as we prioritize the call backs  YOU WILL RECEIVE A CALL  BACK THE SAME DAY AS LONG AS YOU CALL BEFORE 4:00 PM

## 2024-02-26 ENCOUNTER — Ambulatory Visit (HOSPITAL_COMMUNITY): Payer: Self-pay | Admitting: Physician Assistant

## 2024-02-28 ENCOUNTER — Other Ambulatory Visit: Payer: Self-pay

## 2024-03-01 ENCOUNTER — Other Ambulatory Visit: Payer: Self-pay

## 2024-03-10 ENCOUNTER — Ambulatory Visit

## 2024-03-24 ENCOUNTER — Ambulatory Visit: Admitting: Adult Health

## 2024-03-31 ENCOUNTER — Encounter

## 2024-04-27 ENCOUNTER — Telehealth (HOSPITAL_COMMUNITY): Admitting: Psychiatry

## 2024-05-03 ENCOUNTER — Ambulatory Visit (HOSPITAL_COMMUNITY): Admitting: Cardiology

## 2024-06-30 ENCOUNTER — Encounter

## 2024-09-29 ENCOUNTER — Encounter

## 2024-12-29 ENCOUNTER — Encounter

## 2025-03-30 ENCOUNTER — Encounter
